# Patient Record
Sex: Male | Born: 1980
Health system: Southern US, Community
[De-identification: ages and names within clinical notes are randomized; demographics above are authoritative.]

## PROBLEM LIST (undated history)

## (undated) DIAGNOSIS — Z9989 Dependence on other enabling machines and devices: Secondary | ICD-10-CM

## (undated) DIAGNOSIS — I10 Essential (primary) hypertension: Secondary | ICD-10-CM

## (undated) DIAGNOSIS — I872 Venous insufficiency (chronic) (peripheral): Secondary | ICD-10-CM

## (undated) DIAGNOSIS — G4733 Obstructive sleep apnea (adult) (pediatric): Secondary | ICD-10-CM

## (undated) DIAGNOSIS — I890 Lymphedema, not elsewhere classified: Secondary | ICD-10-CM

## (undated) HISTORY — PX: VEIN LIGATION AND STRIPPING: SHX2653

## (undated) HISTORY — PX: OTHER SURGICAL HISTORY: SHX169

---

## 2007-01-02 ENCOUNTER — Encounter (HOSPITAL_BASED_OUTPATIENT_CLINIC_OR_DEPARTMENT_OTHER): Admission: RE | Admit: 2007-01-02 | Discharge: 2007-03-17 | Payer: Self-pay | Admitting: Surgery

## 2007-04-10 ENCOUNTER — Encounter (HOSPITAL_BASED_OUTPATIENT_CLINIC_OR_DEPARTMENT_OTHER): Admission: RE | Admit: 2007-04-10 | Discharge: 2007-05-21 | Payer: Self-pay | Admitting: Surgery

## 2007-06-23 ENCOUNTER — Encounter (HOSPITAL_BASED_OUTPATIENT_CLINIC_OR_DEPARTMENT_OTHER): Admission: RE | Admit: 2007-06-23 | Discharge: 2007-08-06 | Payer: Self-pay | Admitting: Internal Medicine

## 2007-08-25 ENCOUNTER — Encounter (HOSPITAL_BASED_OUTPATIENT_CLINIC_OR_DEPARTMENT_OTHER): Admission: RE | Admit: 2007-08-25 | Discharge: 2007-11-23 | Payer: Self-pay | Admitting: Surgery

## 2007-08-28 ENCOUNTER — Emergency Department (HOSPITAL_COMMUNITY): Admission: EM | Admit: 2007-08-28 | Discharge: 2007-08-29 | Payer: Self-pay | Admitting: Emergency Medicine

## 2007-11-20 ENCOUNTER — Encounter (HOSPITAL_BASED_OUTPATIENT_CLINIC_OR_DEPARTMENT_OTHER): Admission: RE | Admit: 2007-11-20 | Discharge: 2007-12-08 | Payer: Self-pay | Admitting: Internal Medicine

## 2007-12-15 ENCOUNTER — Encounter (HOSPITAL_BASED_OUTPATIENT_CLINIC_OR_DEPARTMENT_OTHER): Admission: RE | Admit: 2007-12-15 | Discharge: 2008-03-10 | Payer: Self-pay | Admitting: Internal Medicine

## 2007-12-27 ENCOUNTER — Inpatient Hospital Stay (HOSPITAL_COMMUNITY): Admission: EM | Admit: 2007-12-27 | Discharge: 2008-01-01 | Payer: Self-pay | Admitting: Emergency Medicine

## 2007-12-28 ENCOUNTER — Ambulatory Visit: Payer: Self-pay | Admitting: *Deleted

## 2007-12-28 ENCOUNTER — Encounter (INDEPENDENT_AMBULATORY_CARE_PROVIDER_SITE_OTHER): Payer: Self-pay | Admitting: Emergency Medicine

## 2008-03-16 ENCOUNTER — Encounter (HOSPITAL_BASED_OUTPATIENT_CLINIC_OR_DEPARTMENT_OTHER): Admission: RE | Admit: 2008-03-16 | Discharge: 2008-06-13 | Payer: Self-pay | Admitting: Internal Medicine

## 2008-06-14 ENCOUNTER — Encounter (HOSPITAL_BASED_OUTPATIENT_CLINIC_OR_DEPARTMENT_OTHER): Admission: RE | Admit: 2008-06-14 | Discharge: 2008-09-12 | Payer: Self-pay | Admitting: General Surgery

## 2008-09-14 ENCOUNTER — Encounter (HOSPITAL_BASED_OUTPATIENT_CLINIC_OR_DEPARTMENT_OTHER): Admission: RE | Admit: 2008-09-14 | Discharge: 2008-12-13 | Payer: Self-pay | Admitting: Internal Medicine

## 2008-12-15 ENCOUNTER — Encounter (HOSPITAL_BASED_OUTPATIENT_CLINIC_OR_DEPARTMENT_OTHER): Admission: RE | Admit: 2008-12-15 | Discharge: 2009-02-20 | Payer: Self-pay | Admitting: Internal Medicine

## 2009-03-28 ENCOUNTER — Encounter (HOSPITAL_BASED_OUTPATIENT_CLINIC_OR_DEPARTMENT_OTHER): Admission: RE | Admit: 2009-03-28 | Discharge: 2009-06-26 | Payer: Self-pay | Admitting: General Surgery

## 2009-05-09 ENCOUNTER — Encounter: Admission: RE | Admit: 2009-05-09 | Discharge: 2009-05-09 | Payer: Self-pay | Admitting: Family Medicine

## 2009-06-29 ENCOUNTER — Encounter (HOSPITAL_BASED_OUTPATIENT_CLINIC_OR_DEPARTMENT_OTHER): Admission: RE | Admit: 2009-06-29 | Discharge: 2009-09-27 | Payer: Self-pay | Admitting: Internal Medicine

## 2009-09-28 ENCOUNTER — Encounter (HOSPITAL_BASED_OUTPATIENT_CLINIC_OR_DEPARTMENT_OTHER): Admission: RE | Admit: 2009-09-28 | Discharge: 2009-11-21 | Payer: Self-pay | Admitting: Internal Medicine

## 2010-01-09 ENCOUNTER — Encounter (HOSPITAL_BASED_OUTPATIENT_CLINIC_OR_DEPARTMENT_OTHER)
Admission: RE | Admit: 2010-01-09 | Discharge: 2010-03-01 | Payer: Self-pay | Source: Home / Self Care | Attending: General Surgery | Admitting: General Surgery

## 2010-04-01 ENCOUNTER — Encounter: Payer: Self-pay | Admitting: Gastroenterology

## 2010-04-04 ENCOUNTER — Encounter (HOSPITAL_BASED_OUTPATIENT_CLINIC_OR_DEPARTMENT_OTHER)
Admission: RE | Admit: 2010-04-04 | Discharge: 2010-04-10 | Payer: Self-pay | Source: Home / Self Care | Attending: General Surgery | Admitting: General Surgery

## 2010-04-11 ENCOUNTER — Encounter (HOSPITAL_BASED_OUTPATIENT_CLINIC_OR_DEPARTMENT_OTHER): Payer: 59 | Attending: General Surgery

## 2010-04-11 DIAGNOSIS — I872 Venous insufficiency (chronic) (peripheral): Secondary | ICD-10-CM | POA: Insufficient documentation

## 2010-04-11 DIAGNOSIS — R609 Edema, unspecified: Secondary | ICD-10-CM | POA: Insufficient documentation

## 2010-04-11 DIAGNOSIS — L97209 Non-pressure chronic ulcer of unspecified calf with unspecified severity: Secondary | ICD-10-CM | POA: Insufficient documentation

## 2010-04-11 DIAGNOSIS — I10 Essential (primary) hypertension: Secondary | ICD-10-CM | POA: Insufficient documentation

## 2010-04-13 ENCOUNTER — Encounter (HOSPITAL_BASED_OUTPATIENT_CLINIC_OR_DEPARTMENT_OTHER): Payer: 59

## 2010-05-16 ENCOUNTER — Encounter (HOSPITAL_BASED_OUTPATIENT_CLINIC_OR_DEPARTMENT_OTHER): Payer: 59 | Attending: General Surgery

## 2010-05-16 DIAGNOSIS — I890 Lymphedema, not elsewhere classified: Secondary | ICD-10-CM | POA: Insufficient documentation

## 2010-05-16 DIAGNOSIS — I872 Venous insufficiency (chronic) (peripheral): Secondary | ICD-10-CM | POA: Insufficient documentation

## 2010-05-16 DIAGNOSIS — L97809 Non-pressure chronic ulcer of other part of unspecified lower leg with unspecified severity: Secondary | ICD-10-CM | POA: Insufficient documentation

## 2010-07-04 ENCOUNTER — Encounter (HOSPITAL_BASED_OUTPATIENT_CLINIC_OR_DEPARTMENT_OTHER): Payer: 59 | Attending: General Surgery

## 2010-07-04 DIAGNOSIS — L97209 Non-pressure chronic ulcer of unspecified calf with unspecified severity: Secondary | ICD-10-CM | POA: Insufficient documentation

## 2010-07-04 DIAGNOSIS — I872 Venous insufficiency (chronic) (peripheral): Secondary | ICD-10-CM | POA: Insufficient documentation

## 2010-07-04 DIAGNOSIS — R609 Edema, unspecified: Secondary | ICD-10-CM | POA: Insufficient documentation

## 2010-07-04 DIAGNOSIS — I10 Essential (primary) hypertension: Secondary | ICD-10-CM | POA: Insufficient documentation

## 2010-07-11 ENCOUNTER — Encounter (HOSPITAL_BASED_OUTPATIENT_CLINIC_OR_DEPARTMENT_OTHER): Payer: 59 | Attending: Plastic Surgery

## 2010-07-11 DIAGNOSIS — L97809 Non-pressure chronic ulcer of other part of unspecified lower leg with unspecified severity: Secondary | ICD-10-CM | POA: Insufficient documentation

## 2010-07-11 DIAGNOSIS — I872 Venous insufficiency (chronic) (peripheral): Secondary | ICD-10-CM | POA: Insufficient documentation

## 2010-07-12 NOTE — Assessment & Plan Note (Signed)
[facility]  NAMEGIFFORD, BALLON NO.:  [id number]  MEDICAL RECORD NO.:  [id number]      DATE OF BIRTH:  June 20, 1980  PHYSICIAN:  Wayland Denis, DO       VISIT DATE:  07/11/2010                                  OFFICE VISIT   [name] is a 30 year old white male who is here for followup on his right lower extremity edema __________ .  He had this problem on and off for number of years ever since he had vein stripping done, ACL tear, and some knee impingement that he had many years ago.  He has been using Prisma with hydrogel and an Radio broadcast assistant with some improvement over the last week.  He is not diabetic, other than venous stasis ulcers, he is healthy.  He does not smoke and no change in medications, not allergic to anything medication wise.  On exam, he is alert and oriented, cooperative, pleasant.  Pupils are equal and reactive to light and accommodation.  Extraocular muscles are intact.  Lungs are clear.  Heart is regular.  Abdomen is soft.  The wound is improving on the right lower extremity on the medial aspect. There is no sign of active infection.  He does have edema and the right leg is more than the left.  ASSESSMENT:  We will continue with Prisma, hydrogel, and Unna boot.  He does have a compression machine at home that he says, he has worn out and so at the moment it is not working.  Clydie Braun is going to call to see if we can find out some place to have it fixed or possibly get another one.  We will see him back in a week.     Wayland Denis, DO     CS/MEDQ  D:  07/11/2010  T:  07/12/2010  Job:  [id number]

## 2010-07-24 NOTE — Assessment & Plan Note (Signed)
[facility]   NAMETAYTUM, WHELLER NO.:  [id number]   MEDICAL RECORD NO.:  [id number]      DATE OF BIRTH:  June 23, 1980   PHYSICIAN:  Theresia Majors. Tanda Rockers, M.D. VISIT DATE:  04/20/2007                                   OFFICE VISIT   SUBJECTIVE:  [name] is a 30 year old man who we are following for a  stasis ulcer.  We have treated him with and Unna boot in the interim.  There has been no excessive drainage, malodor, pain or fever.   OBJECTIVE:  Blood pressure is 149/74, respirations 20, pulse of 81,  temperature 98.4.  Inspection of the right lower extremity shows that the ulcer is  contracting with advancing epithelium.  There has been good control of  edema.   ASSESSMENT:  Clinical improvement.   PLAN:  We will continue him in an Unna boot and reevaluate him in 1  week.      [name], M.D.  Electronically Signed     HAN/MEDQ  D:  04/20/2007  T:  04/21/2007  Job:  [id number]

## 2010-07-24 NOTE — Assessment & Plan Note (Signed)
[facility]   NAMEWAI, [name] NO.:  [id number]   MEDICAL RECORD NO.:  [id number]      DATE OF BIRTH:  June 22, 1980   PHYSICIAN:  Theresia Majors. Tanda Rockers, M.D. VISIT DATE:  07/20/2007                                   OFFICE VISIT   SUBJECTIVE:  [name] is a 30 year old man who we followed for recurrent  statis ulcer involving his right lower extremity.  In the interim, we  treated him with an Radio broadcast assistant.  He continues to be ambulatory.  There  has been no excessive drainage.  His edema has been reasonably well  controlled.   OBJECTIVE:  VITAL SIGNS:  Blood pressure is 158/90, respirations 18,  pulse rate 84, and temperature 98.3.  LOWER EXTREMITY:  Inspection of the right medial lower extremity shows  that the ulcer has indeed contracted.  There is no evidence of active  infection, cellulitis, abscess, or lymphangitis.  The pedal pulse  remains palpable.  The edema is 1 to 2+ with adequate compression, is  manifested by linear wrinkles of the leg.   PLAN:  We have given the patient a prescription for bilateral 30 to 40-  mm below-the-knee compression hose.  We have also reapplied his Unna  wrap.  We will re-evaluate him in 1 week with anticipation that the  ulcer should be reepithelialized.      [name], M.D.  Electronically Signed     HAN/MEDQ  D:  07/20/2007  T:  07/21/2007  Job:  [id number]

## 2010-07-24 NOTE — Assessment & Plan Note (Signed)
[facility]   NAMEREMUS, HAGEDORN NO.:  [id number]   MEDICAL RECORD NO.:  [id number]      DATE OF BIRTH:  Mar 12, 1980   PHYSICIAN:  Lenon Curt. Chilton Si, M.D.   VISIT DATE:  09/30/2008                                   OFFICE VISIT   HISTORY:  A 30 year old male with chronic venous insufficiency and  ulcers of the right lower leg returns for recheck today.  He says since  last seen he has had an increase in drainage.  There has been no odor or  increase in pain.  He has not had fever or chills.  Drainage has been  enough to saturate bandages and gets its way through the Unna boot that  previously had been applied.  He has removed this before coming to  clinic today.   PHYSICAL EXAMINATION:  Right leg appears to have the larger wounds than  the last time I saw him.  There are sharply marginated edges.  There is  clear fluid draining copiously from this area.  There is not any  particular tenderness.   TREATMENT:  Right leg had gentamicin ointment applied to open wounds,  Adaptic over that, ABD pad to absorb drainage, and an Radio broadcast assistant.  We  cultured his right leg wounds.  He was started on Septra DS 20 tablets 1  twice daily.  He is to return in 1 week for reevaluation and bandage  change.   ICD-9 454.2.  CPT [id number].      Lenon Curt Chilton Si, M.D.  Electronically Signed     AGG/MEDQ  D:  09/30/2008  T:  10/01/2008  Job:  [id number]

## 2010-07-24 NOTE — Assessment & Plan Note (Signed)
[facility]   [name], [name]:  [id number]   MEDICAL RECORD NO.:  [id number]      DATE OF BIRTH:  25-Nov-1980   PHYSICIAN:  Lenon Curt. Chilton Si, M.D.   VISIT DATE:  10/21/2008                                   OFFICE VISIT   A 30 year old male returns for recheck of chronic varicose vein  ulcerations of his right leg.  There has been no odor, fever, or chills.  Wound drainage has been minimal.  The patient is satisfied with progress  of his wound.   PHYSICAL EXAMINATION:  Temperature 98.6, pulse 74, respirations 18,  blood pressure 168/92.  Right inner calf now measures 3.0 x 0.6 x 0.1.  There was a small wound of the right outer calf just below the knee,  which I think is trivial scratch.   TREATMENT:  Gentamicin ointment 0.1% applied to wound, then Adaptic ABD  and an Radio broadcast assistant.  The patient is to return for nurse check in 1 week,  physician check in 2 weeks.   ICD-9 454.2.   CPT [id number].      Lenon Curt Chilton Si, M.D.  Electronically Signed     AGG/MEDQ  D:  10/21/2008  T:  10/22/2008  Job:  [id number]

## 2010-07-24 NOTE — Assessment & Plan Note (Signed)
[facility]   NAMERIHAAN, BARRACK NO.:  [id number]   MEDICAL RECORD NO.:  [id number]      DATE OF BIRTH:  1981/02/06   PHYSICIAN:  Lenon Curt. Chilton Si, M.D.   VISIT DATE:  06/17/2008                                   OFFICE VISIT   HISTORY:  A 30 year old male returns to clinic for re-inspection of  chronic varicose vein ulceration.  The patient is feeling well.  Drainage has been very modest.  There has been no fever, pain, chills,  or foul odor.   PHYSICAL EXAMINATION:  Temperature is 97.6, pulse 76, respirations 20,  and blood pressure 149/103.  Wound measurements of the right medial  lower extremity are now 2.9 x 2.6 x 0.2.  There is no open area.  It is  showing good granulation tissue at the base.  There is no significant  drainage and no evidence of purulence.  Previously, open areas of the  wound are remaining closed and the wound is progressing towards healing.   TREATMENT:  The Silvercel, hydrogel, and Unna boot were reapplied to the  right leg today.  The patient will return in 2-3 weeks.  He is to have  weekly changes of his dressing and Unna boot by the nurse in between  visits with the physician.   CPT code 454.0 varicose vein with ulcer.  CPT code 16109.      Lenon Curt Chilton Si, M.D.  Electronically Signed     AGG/MEDQ  D:  06/17/2008  T:  06/18/2008  Job:  [id number]

## 2010-07-24 NOTE — Assessment & Plan Note (Signed)
[facility]   [name], [name] NO.:  [id number]   MEDICAL RECORD NO.:  [id number]      DATE OF BIRTH:  November 20, 1980   PHYSICIAN:  Barry Dienes. Eloise Harman, M.D.     VISIT DATE:                                   OFFICE VISIT   SUBJECTIVE:  The patient is a 30 year old man of African American  descent who is being treated for a chronic right leg venous stasis  ulcer.  He is status post Apligraf treatment on December 22, 2007, and  has most recently had treatment with silver alginate, Anola Gurney, and an  Radio broadcast assistant.  In addition, he has been on Zestoretic 10/12.5 every a.m.  since January 12, 2008.  He has had decreased drainage on the wound and  has not had significant pain or fever or chills.   OBJECTIVE:  VITAL SIGNS:  Blood pressure 129/83, pulse 74, respirations  20, and temperature 98.9.   The condition of the wound is as follows:  Wound #3:  It is located on the medial aspect of the right leg and  measures 2 cm x 1.6 cm x 0.2 cm.  There is no undermining of the wound.  There is a minimal amount of eschar on the wound and a minimal amount of  necrotic tissue on the ulcer.  The wound base is red in color and there  is some pink granulation tissue.  There is no exposed bone, tendon, or  muscle and no foul odor.  There is no significant discharge and the  periwound integrity is intact.   ASSESSMENT:  Slowly improving right leg venous stasis ulcer.   PLAN:  He does not have intact sensation in the area around this ulcer,  so there was no need for topical anesthesia.  A #15 scalpel was used to  debride the necrotic material within the ulcer and the surrounding  tissue.  This was done without significant discomfort, in fact he slept  through the procedure.  There was no significant bleeding.  Following  this, the wound was covered with silver alginate, covered by an Anola Gurney  pad, and an Radio broadcast assistant wrap.  He will also have a basic metabolic panel  drawn today to reevaluate his serum potassium level given that the  Zestoretic was recently started.  He should have a physician  reevaluation in approximately 1 week.           ______________________________  Barry Dienes. Eloise Harman, M.D.     DGP/MEDQ  D:  01/26/2008  T:  01/27/2008  Job:  [id number]

## 2010-07-24 NOTE — Assessment & Plan Note (Signed)
[facility]   [name], [name] NO.:  [id number]   MEDICAL RECORD NO.:  [id number]      DATE OF BIRTH:  Nov 06, 1980   PHYSICIAN:  Lenon Curt. Chilton Si, M.D.   VISIT DATE:  07/15/2008                                   OFFICE VISIT   HISTORY:  A 30 year old male with longstanding history of venous stasis  ulcerations to the right lower leg returns today.  He has had  significant progress and wound healing over the last few weeks.  There  is some minimal drainage now and coverage of the previously ulcerated  area has substantially improved.  There are still ulcerations at the  anterior edge of the wound on the superior edge of the wound, which  appeared to be new, and one had a small amount of purulent material  expressed from it.   PHYSICAL EXAMINATION:  Temperature 98.5, pulse 71, respirations 18,  blood pressure 128/77.  Wound measurement of right medial leg was not  this visitation.  There were small ulcerations on the anterior edge of  the wound and on the superior edge.  There is a lot of scaling of the  skin.  There continues to be no severe edema of his legs.   TREATMENT:  Right leg was sharply debrided of callus and slough under no  anesthesia.  Fibrous tissue and necrotic material were also removed.  This was done with scalpel and forceps.  No bleeding was encountered.  It was a selective debridement of 20 sq cm or less.   Following this, he had reapplication a silvercell, hydrogel, and an Conservator, museum/gallery.  He is asked to come back next week.  The tissue culture was  obtained today.  He received a prescription for Septra DS one twice  daily for 10 days,  Let us hope that wound healing will progress to the  point that he can be discharged from clinic soon.  I have some concerns  over whether there was infection present on the edges of this wound  today.   ICD-9 CODE:  454.0, varicose vein with ulcer.   CPT CODE:  16109 and 97597  selective debridement less than 20 sq cm.      Lenon Curt Chilton Si, M.D.  Electronically Signed     AGG/MEDQ  D:  07/15/2008  T:  07/16/2008  Job:  [id number]

## 2010-07-24 NOTE — Assessment & Plan Note (Signed)
[facility]   [name], [name] NO.:  [id number]   MEDICAL RECORD NO.:  [id number]      DATE OF BIRTH:  [birth year]   PHYSICIAN:  Jonelle Sports. Sevier, M.D.  VISIT DATE:  02/25/2007                                   OFFICE VISIT   HISTORY:  This 30 year old black male with obesity and severe venous  insufficiency of the lower extremities is being followed for chronic  stasis ulcers of the right lower extremity.   He has a prior history of a saphenous vein implant into the iliac venous  system on the contralateral side to this right lower extremity lesion,  but it appears now not clinically at least to be functioning.  Accordingly, Dr. Tanda Rockers after a [place] conversation with Dr. Jacolyn Reedy in  [place] has referred the patient there.  They attempted venous  studies and had limited success because of his morphology.  They do feel  that there is evidence for considerable obstruction if not complete  obstruction in that common femoral vein again.  Accordingly, they had  planned to do a CT scan.  According to the patient, this has been done  but they have as yet sent [place] no report from that.  We will make an  effort to get it.   Meanwhile, the patient feels that his progress of the wounds continues  satisfactorily.  There has been some odor, but no significant drainage,  no pain, no fever or systemic symptoms.   PHYSICAL EXAMINATION:  Blood pressure 156/99, pulse 94, respirations 20,  temperature 98.2.   On the posterior medial aspect of the distal right calf, there is  persistent open ulceration 0.7 x 0.4 x 0.1 cm in an area of chronic  scarring.  There does not appear to be any superficial or deep  infection.   Laterally on that same leg about 2/3 of the way down the calf is a tiny  lesion, almost immeasurable, representing healing from a larger ulcer  there previously.  Again no evidence of infection.   IMPRESSION:  Continued progress  with healing ulcers of the right lower  extremity.   DISPOSITION:  Request will be made from [facility] to try to obtain the  results of the CT scan, after which Dr. Tanda Rockers may wish to discuss the  issue again with Dr. Jacolyn Reedy.   The entire right calf and foot are cleansed with hypochlorite solution  because of the increased odor suggesting perhaps there is a significant  bacterial burden there.  Following this, the extremity was returned to  the Unna wrap.   The patient's return visit here will be in 7 to 10 days.           ______________________________  Jonelle Sports Cheryll Cockayne, M.D.     RES/MEDQ  D:  02/25/2007  T:  02/25/2007  Job:  [id number]

## 2010-07-24 NOTE — Assessment & Plan Note (Signed)
[facility]   [name], [name] NO.:  [id number]   MEDICAL RECORD NO.:  [id number]      DATE OF BIRTH:  [birth year]   PHYSICIAN:  Maxwell Caul, M.D.      VISIT DATE:                                   OFFICE VISIT   LOCATION:  [facility].   This is a 30 year old man who is seen for evaluation of a chronic right  venous stasis ulcer and he has been followed for this.  He had an  Apligraf application on October 30, 2007, and has been treated with  silver alginate dressings covered by [name] and an Radio broadcast assistant.  He  has not had any significant pain.  However, he does have a moderate  amount of drainage.  He had actually removed the Unna boot before he  came in today, so we are not exactly certain about this, although there  was some drainage present even while we were cleaning up the wound.   On examination, he is afebrile.  The wound on the right leg is actually  quite small.  It was manually debrided of some easily removable eschar.  However, in doing this, I noted that there is a considerable amount of  tissue around this that seems boggy, and when compress, there was some  drainage coming out of the wound.  This did not appear to be infected.  There was no pain or tenderness.  However, I think there may be more  tissue at risk around this tiny wound.   IMPRESSION:  Venous stasis ulceration.  The wound was debrided as  described above.  We will continue with the silver alginate, Extra-Sorb,  and thick ABD pad over the tissue surrounding the lower and lateral  aspects of the wound.  All of this will be under an Unna wrap.  We will  see this again next week.           ______________________________  Maxwell Caul, M.D.     MGR/MEDQ  D:  02/02/2008  T:  02/02/2008  Job:  [id number]

## 2010-07-24 NOTE — Assessment & Plan Note (Signed)
[facility]   NAMEAMMAN, BARTEL NO.:  [id number]   MEDICAL RECORD NO.:  [id number]      DATE OF BIRTH:  [birth year]   PHYSICIAN:  Theresia Majors. Tanda Rockers, M.D. VISIT DATE:  01/06/2007                                   OFFICE VISIT   SUBJECTIVE:  Mr. Dettore is a 30 year old man referred by Dr. Louanna Raw  for evaluation of a large ulceration on the right lower extremity.   IMPRESSION:  Postphlebitic state with recurrent stasis ulceration.   RECOMMENDATION:  Proceed with an Unna boot protocol followed by  compression garments.   SUBJECTIVE:  Mr. Jeansonne is a 30 year old man who has suffered with  recurrent stasis ulceration for several years.  The current ulcer has  been present for approximately 1 month.  It has been associated with  itching, malodorous drainage but no fever.  The patient does not recall  any specific trauma predisposing to the ulceration.   Over the past 8 years the patient has had recurrent stasis ulcerations  related to swelling and his obesity.  He has been treated in a wound  center in [place], [place] on a palliative status.  He has had previous  lower extremity vein transposition surgery in an attempt to correct  venous insufficiency.  He has had multiple venograms and duplex scans  which confirm the postphlebitic state.  He has never had deep venous  thrombosis and has never been treated with anticoagulation.   CURRENT MEDICAL CONDITIONS:  Include only his exogenous obesity.  HE  DENIES ALLERGIES.   CURRENT MEDICATION LIST:  Includes only over-the-counter pain  medication.   PREVIOUS SURGERY AND PROCEDURES:  Have included the multiple venograms,  vein strippings from the right leg and a tonsillectomy.   FAMILY HISTORY:  Positive for lung cancer, prostate cancer and diabetes.   Socially he is single.  He lives in [place].  He is employed as a  Surveyor, minerals.   REVIEW OF SYSTEMS:  The patient has  difficulty with his exercise  tolerance related to his exogenous obesity.  He specifically denies  angina pectoris, syncope.  His appetite is good and his weight has been  stable at 330 pounds over the past year.  There are no bowel or bladder  complaints. He denies polydipsia, polyphagia, or polyuria.  The  remainder of the review of systems is negative.   PHYSICAL EXAM:  He is a robust young man in no acute distress.  He is  oriented, in good contact with reality and able to give the history.  The blood pressure is 143/89, respirations are 20, pulse rate is 90,  temperature is 98.5.  HEENT:  Exam is clear.  NECK:  Supple, trachea is midline, thyroid is nonpalpable.  LUNGS:  Clear.  HEART:  Sounds are distant.  ABDOMEN:  Robust with no tenderness.  EXTREMITY:  Abnormal with chronic changes of stasis, more severe on the  right lower extremity.  There is an ulceration at the soleus level  extending half the circumference of the leg.  There is  hyperpigmentation.  The dorsalis pedis pulse is +3 bilaterally.  There  is +2 edema on the right lower extremity, +1 edema on the left.  Sensation is  preserved as tested by the signs of Weinstein filament.   DISCUSSION:  This 30 year old man has a longstanding history of  recurrent stasis ulceration related to a postphlebitic state and gives a  history of having undergone surgical attempts at restoration of venous  competency.  On today's exam he has a shaggy necrotic stasis ulcer.  We  have deferred debridement at this time due to the degree of  desquamation, rather we will start the patient on Dome's paste Unna boot  and reevaluate him in 1 week.  At that time he will likely require  debridement of liquefied and shedding desquamated skin.  We will prepare  him to reinitiate the use of external compression hose.   With regard to his obesity the patient informs me that he has previously  been given an exercise program which he intends to start  under the  direction of a trainer.  He has delayed because of his move to this area  from [place].  We have given the patient an opportunity to ask questions, he seems to  understand.  He expresses gratitude for having been seen in the clinic  and indicates that he will be compliant.      [name], M.D.  Electronically Signed     HAN/MEDQ  D:  01/06/2007  T:  01/06/2007  Job:  [id number]   cc:   Louanna Raw

## 2010-07-24 NOTE — Assessment & Plan Note (Signed)
[facility]   NAMESOLMON, BOHR NO.:  [id number]   MEDICAL RECORD NO.:  [id number]      DATE OF BIRTH:  [birth year]   PHYSICIAN:  Lenon Curt. Chilton Si, M.D.   VISIT DATE:  04/29/2008                                   OFFICE VISIT   HISTORY:  A 30 year old male returns again for re-inspection of wounds  of his right leg due to varicose veins, stasis dermatitis, and  ulceration.  Since his last visit here, the wounds have continued to  heal slowly.  There no longer is any odor to the wounds according to the  patient, and there is very little discomfort.  He has tolerated the  application of medications and Unna boot well.   PHYSICAL EXAMINATION:  VITAL SIGNS:  Temp 99.1, pulse 98, respirations  18, blood pressure 137/97 (improved since last visit).  Wound  measurement of the right medial lower extremity is now 2.7 x 3.5 x 0.2  cm.  There is no significant erythema.  There is only slight drainage.  There is a punched out character to the wound overall.  There is no  significant callus or other adherent material.   TREATMENT:  Silvercel, hydrogel, reapplication of Unna boot.   ICD-9 code 454.0 varicose veins with stasis ulcer.  401.9, hypertension.   CPT [id number].   The patient will return in 1 week.      Lenon Curt Chilton Si, M.D.  Electronically Signed     AGG/MEDQ  D:  04/29/2008  T:  04/30/2008  Job:  [id number]

## 2010-07-24 NOTE — Assessment & Plan Note (Signed)
[facility]   NAMEKOLLYN, LINGAFELTER NO.:  [id number]   MEDICAL RECORD NO.:  [id number]      DATE OF BIRTH:  1980/08/08   PHYSICIAN:  Lenon Curt. Chilton Si, M.D.   VISIT DATE:  02/19/2008                                   OFFICE VISIT   HISTORY:  This 30 year old male with large venous stasis ulcer of the  right leg that is showing some signs of resolution, now returns for  recheck of his wound.  Judging from pictures in the chart and  descriptions, it appears at this wound was nearly healed in June.  The  patient has actually been followed in this clinic since October 2008.  By mid August, it was deteriorating and by October, showed significant  deterioration of the wound, it now has bridged and shows some signs of  healing; however, the areas that are worrisome as will be detailed  below.  The patient denies any significant pain.  Because of the chronic  venous insufficiency and swelling of legs, this wound weeps a copious  amount of serous fluid.  There is no bad odor to the wound.   PHYSICAL EXAMINATION:  Temperature 98.0, pulse 82, respirations 16, and  blood pressure 155/97.  Wound measurements of the right medial lower  extremity are now 1.9 x 1.9 x 0.2 and actually 2 separate open areas  with bridging of tissue between them.  On the proximal end of the lower  portion of the open wound, there was a deep scooped out area and a shelf-  like characteristic of healthy skin around it and to the wound, there  was slough and goo in this area.  In the upper wound similarly there was  a very small area that was covered with slough and goo that also is  showing signs of darkening of tissue and is worrisome for either  infection or wound deterioration.  The overall wound appearance actually  is much better as compared to previous pictures, but again we have these  2 worrisome areas.   It should be noted that the patient had a Profore dressing placed  his  last visit about a week ago and this did not stay in place more than a  half a day.  The patient therefore resumed wearing his stockings and has  not been wearing any other sort of material on top of his legs for  compression.  He does have a venous pump at home that he uses 30 minutes  twice daily.   TREATMENT:  There was a sharp debridement using scalpel and forceps to  remove callus and slough that surrounded the wounds and were anterior to  the wounds.  The wounds again showed an overall improvement, but there  were some deteriorated areas within the wound as described above.  There  was no bleeding.  No anesthesia was used.  The procedure was tolerated  well.   Following debridement, Prisma and an Unna boot were reapplied to the  right leg.  He was instructed to use a venous pump 30 minutes twice  daily.  The patient had tissue cultures done of the wound today in case  there was some infection that we could treat.  ICD-9, 454.0 varicose vein with stasis ulcer.  CPT codes, 16109 and [phone] (selective debridement less than 20 cm2).      Lenon Curt Chilton Si, M.D.  Electronically Signed     AGG/MEDQ  D:  02/19/2008  T:  02/20/2008  Job:  [id number]

## 2010-07-24 NOTE — Assessment & Plan Note (Signed)
[facility]   NAMEJVEON, POUND NO.:  [id number]   MEDICAL RECORD NO.:  [id number]      DATE OF BIRTH:  17-Sep-1980   PHYSICIAN:  Theresia Majors. Tanda Rockers, M.D. VISIT DATE:  08/26/2007                                   OFFICE VISIT   0   SUBJECTIVE:  Mr. Espin is a 30 year old man with a postphlebitic right  lower extremity associated with recurrent stasis ulcerations.  He  presents after noting moist drainage on the medial aspect of the right  lower leg in an area of previous ulceration.  There has been no interim  fever.  There has been no chest pain or palpitations.   OBJECTIVE:  VITAL SIGNS:  His blood pressure is 160/87, respirations 20,  pulse rate 83, temperature is 98.6.  Inspection of the right medial  lower extremity shows a recurrent stasis ulcer with chronic changes of  stasis including hyperpigmentation and woody, brawny induration.  There  is no particular tenderness.  The pedal pulse remains palpable.  There  is associated 2-3 plus edema.  The left lower extremity is normal.  There is an area of thrombosed varix on the medial posterior buttock  area.  There is no ulceration at this level, however.   ASSESSMENT:  Recurrent stasis ulcer associated with a postphlebitic  state.   PLAN:  We are returning the patient to triamcinolone ointment plus an  Unna wrap.  The nurse will reevaluate him weekly.  The physician will  reevaluate him in 2 weeks.   We have counseled the patient regarding the necessity for a  comprehensive weight loss program.  We have emphasized a relationship  between his stasis, the underlying postphlebitic state including the  iliac vein stenosis/occlusion.  We have defined his current condition as  one that is permanent and not likely to spontaneously resolve.  Comprehensive weight reduction of 100 pounds would greatly leave his  current symptoms of cycle of recurrences.  We have given the patient  opportunity to ask questions.  He seems to understand and indicates that  he would like to pursue the suggestion.  We have encouraged him to  discuss a bariatric consultation with his primary care physician.      [name], M.D.  Electronically Signed     HAN/MEDQ  D:  08/26/2007  T:  08/27/2007  Job:  [id number]

## 2010-07-24 NOTE — Assessment & Plan Note (Signed)
[facility]   [name], MARSALIS NO.:  [id number]   MEDICAL RECORD NO.:  [id number]      DATE OF BIRTH:  Jun 23, 1980   PHYSICIAN:  Lenon Curt. Chilton Si, M.D.   VISIT DATE:  05/19/2008                                   OFFICE VISIT   HISTORY:  A 30 year old male returns for reinspection of persistent  varicose vein with ulceration of the right leg.  He has chronic venous  insufficiency.  He denies any significant increase in pain or  discomfort.  Wound odor has not changed to him.  The patient failed to  get prescription for Septra DS that was ordered on May 13, 2008, filled  until just this morning.  He did have a positive culture for MRSA on  May 06, 2008, which was finally reported out on May 13, 2008.  It  showed only rare MRSA, however, with the persistence of this wound, it  was felt important to go ahead and treat this.  The organism is  resistant to doxycycline, so Septra was ordered per the sensitivity  reports.   PHYSICAL EXAMINATION:  Temperature 98.0, pulse 80, respirations 20,  blood pressure 145/105.  Wound measurements of right medial lower  extremity #3 were 3.7 x 13.2 x 0.2 cm.  These wound measurements are  significantly larger than previously noted.  There are areas of healthy  skin in between with some bridging across the ulcerations.  I think  there may be a discrepancy in how the wound was measured the last time  versus this time.  Overall appearance of the wound does not appear that  different to me than on my examination on May 06, 2008.   TREATMENT:  No further debridement was done today.  We reapplied  SilverCel, hydrogel, and an Radio broadcast assistant.  The patient was strongly  encouraged to take the Septra that had been ordered on May 13, 2008 and  was told that it was unfortunately that he had delayed getting this  prescription filled because sometimes the organism present in his wound  could advance rapidly and  disrupt healing.  The patient stated that he  would begin taking the medication today.   Return in 1 week.   ICD-9 code 454.0 varicose veins with ulcer.  CPT X5182658.      Lenon Curt Chilton Si, M.D.  Electronically Signed     AGG/MEDQ  D:  05/19/2008  T:  05/19/2008  Job:  [id number]

## 2010-07-24 NOTE — Assessment & Plan Note (Signed)
[facility]   NAMECASHTYN, [name] NO.:  [id number]   MEDICAL RECORD NO.:  [id number]      DATE OF BIRTH:  1981-02-14   PHYSICIAN:  Maxwell Caul, M.D.      VISIT DATE:                                   OFFICE VISIT   [name] is a gentleman we have been following for a large venous stasis  ulceration on the right leg.  This is a gentleman who had previous stent  placement in his groin and he says a venous damage proximally at the  same time.  In any case, he has been left with severe venous stasis on  the right leg much worse than the left.  The wound we have been dealing  with is on the right leg.  This continues to improve using a collagen-  based dressing and an Unna boot wrap.   On examination of right leg, the areas in question are continuing to  epithelialize.  They look very healthy.  We re-applied Prisma and  hydrogel to this.   IMPRESSION:  Venous stasis ulcerations.  We have continued a Prisma and  hydrogel under an Unna wrap.  I have given him a prescription for a 30-  to 40-mm below-knee graded pressure stockings.  He can be measured for  this before he comes in next time so that we can get this under way.  Other than that, I suspect this will resolve in the next 1-2 visits.           ______________________________  Maxwell Caul, M.D.     MGR/MEDQ  D:  03/10/2008  T:  03/11/2008  Job:  [id number]

## 2010-07-24 NOTE — Assessment & Plan Note (Signed)
[facility]   NAMEZAYN, SELLEY NO.:  [id number]   MEDICAL RECORD NO.:  [id number]      DATE OF BIRTH:  [birth year]   PHYSICIAN:  Theresia Majors. Tanda Rockers, M.D. VISIT DATE:  07/27/2007                                   OFFICE VISIT   SUBJECTIVE:  Mr. Otting is a 30 year old man who we have followed for a  recurrent stasis ulcer involving the right medial lower extremity.  He  returns for followup.  In the interim, we have treated him with external  compression.  There has been no interim fever.  He continues to be  ambulatory.  The edema has been reasonably well controlled.  He has  procured a custom hose, but he did not bring it with him.   OBJECTIVE:  Blood pressure is 157/93, respirations 16, pulse rate 80,  temperature 98.  Inspection of the right lower extremity shows that the  edema is reasonably well controlled.  It is trace to 1+.  Pedal pulse  remains palpable.  Capillary refill is brisk.  The ulcer is completely  100% epithelialized.  There is no evidence of ascending infection.   ASSESSMENT:  Resolved stasis ulcer.   PLAN:  We have instructed the patient in the wear and the use of his  external compression hose.  We have given him an opportunity to ask  questions.  He seems to understand these instructions and indicates that  he will be compliant.  We are discharging him to return to the care of  his primary care physician, Dr. Louanna Raw.  We will re-evaluate him  on a p.r.n. basis.      [name], M.D.  Electronically Signed     HAN/MEDQ  D:  07/27/2007  T:  07/28/2007  Job:  [id number]

## 2010-07-24 NOTE — Assessment & Plan Note (Signed)
[facility]   [name], MCCANNON NO.:  [id number]   MEDICAL RECORD NO.:  [id number]      DATE OF BIRTH:  [birth year]   PHYSICIAN:  Lenon Curt. Chilton Si, M.D.        VISIT DATE:                                   OFFICE VISIT   HISTORY:  A 30 year old male returns for reevaluation of venous stasis  ulcer of the right lower leg.  Wound has done fairly well.  Odor that  previously noted by the patient has disappeared over this last week.  The wound itself seems smaller to him and to me.   PHYSICAL EXAMINATION:  Temperature 98.2, pulse 81, respirations 18, and  blood pressure 159/101.  Wound measurement of #3, lower extremity of the  right leg is now 2.4 x 5 x 0.2.  These wound measurements were larger  than those we recorded last week.  However, the wound itself is drier  and looks smaller to my eye and the patient's will simply be measured  next week.   TREATMENT:  We applied Silvercel, hydrogel, and an Unna boot to the  wound.  The patient to return in 1 week.   ICD-9 454.0 varicose veins with stasis ulcer and 401.9 hypertension.   CPT [id number].      Lenon Curt Chilton Si, M.D.  Electronically Signed     AGG/MEDQ  D:  04/22/2008  T:  04/23/2008  Job:  [id number]

## 2010-07-24 NOTE — Group Therapy Note (Signed)
NAMEKHALIFA, KNECHT NO.:  [id number]   MEDICAL RECORD NO.:  [id number]           PATIENT TYPE:   LOCATION:                                 FACILITY:   PHYSICIAN:  Barry Dienes. Eloise Harman, M.D.DATE OF BIRTH:  November 26, 1980                                 PROGRESS NOTE   SUBJECTIVE:  The patient is a 30 year old African American man with a history of  postphlebitic syndrome and right leg stasis ulcer.  He is status post  application of Apligraf on October 30, 2007, followed by treatment with  Kandis Mannan, ABD pad, and Unna boot; change once weekly.  He has not  had significant pain in the right leg, has not had any recent fever or  chills.  He also has not had significant drainage from the ulcer.   OBJECTIVE:  On the right medial leg there is a shallow ulcer that measures 1.3 cm x  4.5 cm x 0.2 cm.  The ulcer does not have covering eschar or necrotic  tissue.  The wound base is red and there is some red granulation tissue.  There is no exposed bone, tendon, or muscle.  There was a moderate  amount of serosanguineous discharge and the periwound integrity was  intact.   ASSESSMENT:  Healing right leg venous stasis ulcer following application of Apligraf.   PLAN:  To the right leg ulcer we will apply Hydrofera Blue followed by an ABD  pad and with an Radio broadcast assistant wrap.  The dressings will be change once  weekly by nursing staff.  He should have a physician re-evaluation in  approximately 3 weeks.           ______________________________  Barry Dienes Eloise Harman, M.D.     DGP/MEDQ  D:  11/17/2007  T:  11/18/2007  Job:  [id number]

## 2010-07-24 NOTE — Assessment & Plan Note (Signed)
[facility]   NAMETAFT, WORTHING NO.:  [id number]   MEDICAL RECORD NO.:  [id number]      DATE OF BIRTH:  Feb 03, 1981   PHYSICIAN:  Lenon Curt. Chilton Si, M.D.   VISIT DATE:  10/14/2008                                   OFFICE VISIT   HISTORY:  A 30 year old male with chronic venous stasis disease,  particularly in the right leg and ulcerations, returns for recheck of  his wounds today.  He has done fine since he was last seen.  There has  been no odor to the wound, drainage is subsiding.  He is tolerating  current therapy well.   PHYSICAL EXAMINATION:  VITAL SIGNS:  Temperature 98.6, pulse 96,  respirations 20, and blood pressure 160/100.   Wound to the right inner calf area, which is the one that is most  chronic, now measuring 4.0 x 1.3 x less than 0.1 cm in diameter.  There  is a area of the right outer calf that he says has been there for quite  sometime, but will be categorized as new wound today because we have  been measuring it, that is 1.0 x 0.2 x less than 0.1 cm.  There is no  odor to the wounds.  There is no erythema.  There is some drainage to  the chronic edema of his legs.  Overall, progression of the wound seems  to be positive at the present time.  We will simply continue current  treatments.   TREATMENT:  Gentamicin ointment applied to raw areas of the skin,  covered by Adaptic and ABD pad, and then Unna boot was applied to the  right leg.   The patient will return in 1 week.   ICD-9 code:  454.2, varicose veins with ulcer and inflammation.  CPT:  [id number].      Lenon Curt Chilton Si, M.D.  Electronically Signed     AGG/MEDQ  D:  10/14/2008  T:  10/15/2008  Job:  [id number]

## 2010-07-24 NOTE — Assessment & Plan Note (Signed)
[facility]   NAMERAIFORD, FETTERMAN NO.:  [id number]   MEDICAL RECORD NO.:  [id number]      DATE OF BIRTH:  [birth year]   PHYSICIAN:  Maxwell Caul, M.D.      VISIT DATE:                                   OFFICE VISIT   [name] is a gentleman who has a refractory venous stasis wound on the  right lateral leg.  We had this healed earlier this year however in June  it reopened.  He has been using Unna and Hydrofera Blue but making very  poor progress.   On examination, temperature was 98.4.  The wound was dry and had some  adherent eschar.  We did a nonselective debridement of this area to  remove the eschar and to prep the wound bed for an Apligraf which was  then successfully placed.   IMPRESSION:  Refractory venous stasis ulcer.  The Apligraf was fixed  with Steri-Strips and had Mepitel applied and then he was rewrapped with  an Solicitor.  We will see him again in the usual fashion next week.           ______________________________  Maxwell Caul, M.D.     MGR/MEDQ  D:  10/30/2007  T:  10/31/2007  Job:  [id number]

## 2010-07-24 NOTE — Assessment & Plan Note (Signed)
[facility]   NAMETRYGVE, THAL NO.:  [id number]   MEDICAL RECORD NO.:  [id number]      DATE OF BIRTH:  [birth year]   PHYSICIAN:  Lenon Curt. Chilton Si, M.D.   VISIT DATE:  08/12/2008                                   OFFICE VISIT   HISTORY:  A 30 year old male with chronic venous stasis ulcers of the  right lower leg.  He was seen in recheck today.  He has done well, has  no drainage.  No odor and feels that he is comfortable with his leg at  the present time.   PHYSICAL EXAMINATION:  VITAL SIGNS:  Temperature 99, pulse 87, blood  pressure 130/81.  EXTREMITIES:  There was really no open wounds on the chronically scarred  area where he had his prior venous ulceration.  Wound is deemed healed.  At this time, tissues are fragile and there is a brawny edema.  Further  trauma or poor care of this area will make it extremely high risk for  breakdown of these tissues.   TREATMENT:  The patient is discharged from clinic today.  He was advised  to wear a compression hose daily.   ICD-9 code 454.0, varicose veins with ulceration.   CPT code 70623.      Lenon Curt Chilton Si, M.D.  Electronically Signed     AGG/MEDQ  D:  08/12/2008  T:  08/13/2008  Job:  [id number]

## 2010-07-24 NOTE — Assessment & Plan Note (Signed)
[facility]   NAMECLEDIS, SOHN NO.:  [id number]   MEDICAL RECORD NO.:  [id number]      DATE OF BIRTH:  02/10/1981   PHYSICIAN:  Maxwell Caul, M.D. VISIT DATE:  10/02/2007                                   OFFICE VISIT   [name] is a gentleman with very significant venous stasis and a  refractory venous stasis ulceration on his right leg.  We had had this  healed; however, he was seen in June with reopening of the wound on the  right leg.  He has been wearing an Solicitor with Hydrofera Blue.   On examination, his temperature is 98.6, pulse 86, respirations 16, and  blood pressure 135/79.  Wound measures 3 x 4.8 x 0.25; this is an  improvement.  The wound has healthy granulation and some  epithelialization.  There is some edema superiorly that we will need to  take into account when doing the wrap.  He tells me he did have an  Apligraf either late 2008 or early 2009, which helped in healing;  however, we ultimately ended up with reopening.   IMPRESSION:  Refractory venous stasis ulceration.  I have reordered an  Apligraf.  In the meantime, we have continued with Hydrofera Blue and  Unna with TCA ointment.  We will see him again in [facility] next  weekend and first physician visit after that.           ______________________________  Maxwell Caul, M.D.     MGR/MEDQ  D:  10/02/2007  T:  10/03/2007  Job:  [id number]

## 2010-07-24 NOTE — Assessment & Plan Note (Signed)
[facility]   NAMERETT, STEHLIK NO.:  [id number]   MEDICAL RECORD NO.:  [id number]      DATE OF BIRTH:  [birth year]   PHYSICIAN:  Theresia Majors. Tanda Rockers, M.D. VISIT DATE:  04/27/2007                                   OFFICE VISIT   SUBJECTIVE:  [name] is a 30 year old man with a stasis ulcer involving  the right medial lower extremity.  He has responded to external  compression.  He returns for followup.  In the interim, there has been  scant drainage and no fever.  He continues to be ambulatory.   OBJECTIVE:  Blood pressure is 149/96, respirations 18, pulse rate 84,  temperature 97.9.  Inspection of the right medial lower extremity shows that the ulcer  continues to decrease in area and volume.  There is no evidence of  ascending infection, abscess, or cellulitis.   ASSESSMENT:  Clinical improvement of stasis ulcer to external  compression.   PLAN:  We are returning the patient to an Radio broadcast assistant.  We will reevaluate  him in 1 week p.r.n.      [name], M.D.  Electronically Signed     HAN/MEDQ  D:  04/27/2007  T:  04/28/2007  Job:  [id number]

## 2010-07-24 NOTE — Assessment & Plan Note (Signed)
[facility]   NAMEGAVINN, COLLARD NO.:  [id number]   MEDICAL RECORD NO.:  [id number]      DATE OF BIRTH:  [birth year]   PHYSICIAN:  Jonelle Sports. Sevier, M.D.       VISIT DATE:                                   OFFICE VISIT   HISTORY:  This 30 year old black male has been followed for a pair of  stasis ulcerations on the right lower extremity, which extremity is  involved in chronic venous insufficiency and also lymphedema.  He has  had lesions both medially and laterally in the mid to distal right calf  areas of that extremity which have been treated with Unna wraps with  progressive healing.   He reports that since his last visit here there has been no pain,  drainage, increased odor, fever or systemic symptoms.   EXAMINATION:  Blood pressure 150/95, pulse 78, respirations 20,  temperature 98.5.   Examination of the leg shows that both wounds, the lateral and medial  wounds, are now healed.  There is some separated outer layer of skin  surrounding the areas where the ulcers eventually healed.  There is no  evidence of significant infection.   IMPRESSION:  Healing of stasis wounds, right lower extremity.   DISPOSITION:  The loosened skin is sharply pared away without incident  over the medial lesion here, and it would appear the ulcer is indeed  truly healed.   PLAN:  The plan will be to allow the patient to return directly home  today, since his edema is now at its lowest ebb, and go immediately into  the new compression hose which he has available there.  He is advised to  wear them every day, putting them on at daybreak before getting out of  bed and taking them off each night at bedtime.   He will be given a p.r.n. return basis here.           ______________________________  Jonelle Sports. Cheryll Cockayne, M.D.     RES/MEDQ  D:  03/04/2007  T:  03/04/2007  Job:  [id number]

## 2010-07-24 NOTE — Assessment & Plan Note (Signed)
[facility]   [name], [name] NO.:  [id number]   MEDICAL RECORD NO.:  [id number]      DATE OF BIRTH:  12-Jan-1981   PHYSICIAN:  Theresia Majors. Tanda Rockers, M.D. VISIT DATE:  02/17/2007                                   OFFICE VISIT   SUBJECTIVE:  [name] is a 30 year old man who we are treating for a  stasis ulcer of the right lower extremity secondary to postphlebitic  state with a history of right iliac vein occlusion.  In the interim, the  patient has been seen at [facility] by Dr. Audrea Muscat and his consult at present  is outstanding.   In the interim, there has been no excessive drainage.  The patient  continues to be ambulatory.   OBJECTIVE:  Blood pressure is 163/92, respirations 20, pulse rate of 88,  temperature is 97.8.  Inspection of the right lower extremity shows that  there is progressive epithelium from the periphery with no evidence of  ascending infection or cellulitis.  The pedal pulse remains palpable.  Capillary refill is brisk.  Edema is reasonably controlled and judged at  2+.   ASSESSMENT:  Clinical response to external compression.   PLAN:  Will continue to place the patient in the [facility] boot.  We have  requested that the consultation report be placed on the chart prior to  his next visit.  We will reevaluate the patient in 1 week.      [name], M.D.  Electronically Signed     HAN/MEDQ  D:  02/17/2007  T:  02/17/2007  Job:  [id number]

## 2010-07-24 NOTE — Assessment & Plan Note (Signed)
[facility]   NAMEDUAYNE, BRIDEAU NO.:  [id number]   MEDICAL RECORD NO.:  [id number]      DATE OF BIRTH:  1980/12/22   PHYSICIAN:  Lenon Curt. Chilton Si, M.D.   VISIT DATE:  05/27/2008                                   OFFICE VISIT   HISTORY:  A 30 year old male returns for reinspection of varicose veins  with ulceration.  The patient says he feels fine and has done well over  the last week.  There has been no fever or chills, and he denies any  odor to the wound.  He has been treated with an Radio broadcast assistant, applied over  Silvercel and hydrogel to the wounds of the right lower leg,  particularly medially.   PHYSICAL EXAMINATION:  Temp 98.3, pulse 75, respirations 21, blood  pressure 132/92.  The wound of the right lower leg has an area of  bridging and really there are 2 wounds at the present time rather than a  much larger single wound which has been previously described.  The  portion of the wound that lies medially appears deeper, and the wound  bed is a bright red color.  Current measurements are 2.0 x 4.0 x 0.2 cm  in depth.  There is a more distal wound of the right medial lower  extremity which now measures 3.0 x 6.0 x a shallow depth.  There is no  foul odor.  There is moderate amount of drainage.  The patient was  started on Septra a week ago.  He continues it and has at least 3 more  days according to his best estimate today.   TREATMENT:  Wound bed was recultured and the upper wound of the lower  extremity.  Previous culture showed MRSA, and I want to be sure that  this is cleaning up because of what appears to be a deepening wound on  the upper portion of the right lower extremity.   Following examination, the patient had Silvercel, hydrogel, and an Unna  boot reapplied and was told to finish Septra and he will return in 1  week for a nurse dressing change and in 2 weeks for a nurse dressing  change and in 3 weeks for a physician  visit.   ICD-9:  454.0.   CPT:  [id number].       Lenon Curt Chilton Si, M.D.  Electronically Signed     AGG/MEDQ  D:  05/27/2008  T:  05/28/2008  Job:  [id number]

## 2010-07-24 NOTE — Assessment & Plan Note (Signed)
[facility]   NAMEZALMAN, HULL NO.:  [id number]   MEDICAL RECORD NO.:  [id number]      DATE OF BIRTH:  [birth year]   PHYSICIAN:  Lenon Curt. Chilton Si, M.D.   VISIT DATE:  03/25/2008                                   OFFICE VISIT   HISTORY:  A 30 year old male returns for reevaluation of severe  bilateral venous stasis wounds, particularly in the right leg.  There  has been significant improvement in these wounds since he was last here,  and the patient feels that he is doing quite well.  He has been treated  with Prisma, hydrogel, and Unna boot, and has responded well to this  treatment.   PHYSICAL EXAMINATION:  Temperature 98.9, pulse 80, respirations 16, and  blood pressure 150/98.  Wound examination shows measurements to be 2.0 x  1.1 x 0.1 of the right medial lower extremity area.  There is no  evidence of purulence, infection, or really much drainage.   TREATMENT:  A right medial calf area wound was treated with Silvercel,  Hypergel, and a repeat application of Unna boot.  He is to return in 1  week for reinspection of the wound and probable reapplication of boot  until full healing takes place.   ICD-9 code 454.0 varicose veins with stasis ulcer.  CPT code 16109.      Lenon Curt Chilton Si, M.D.  Electronically Signed     AGG/MEDQ  D:  03/25/2008  T:  03/26/2008  Job:  [id number]

## 2010-07-24 NOTE — Assessment & Plan Note (Signed)
[facility]   NAMEDADEN, MAHANY NO.:  [id number]   MEDICAL RECORD NO.:  [id number]      DATE OF BIRTH:  June 24, 1980   PHYSICIAN:  Lenon Curt. Chilton Si, M.D.   VISIT DATE:  07/29/2008                                   OFFICE VISIT   HISTORY:  A 30 year old male with chronic leg edema and ulceration of  the right lower leg returns for recheck today.  Wound looked very good  the last time we saw him. We felt this might be a checkout visit.  Unfortunately, the wound seems to have deteriorated in several areas  around the edge of it.  He also did not get the antibiotic prescription  filled that we gave him last time. He says he will do it today.   PHYSICAL EXAMINATION:  Temperature 98.7, pulse 97, respirations 18,  blood pressure 135/83.   Wound measurements of the right medial leg shows the central area of  prior ulceration to be healing or fully healed.  Around the periphery of  the wound, there are several areas of pit-like crevices filled with pus.  We debrided these areas by unroofing them and there was a sheet of thin  cellophane-type skin that was removed from the top of the wound and  there were other areas where the purulent material once the cap was  removed from the crip just rolled out   TREATMENT:  The wounds were debrided with forceps as noted above.  Skin  and slough were removed.  No anesthetic was used.  The patient tolerated  the procedure well.   Following debridement of the right leg, we applied Silvercel, hydrogel,  and an Radio broadcast assistant.  He is to return in 1 week for recheck of this.  There  was a culture done of the right leg wounds.  We encouraged him again to  get the Septra DS one twice daily filled.  He will see the nurse in 1  week for dressing change and the physician in 2 weeks.   ICD-9 CODE:  454.2, varicose vein with ulceration and inflammation.  782.3, edema.   CPT:  [id number] and 16109, selective debridement less  than 20 cm2.      Lenon Curt Chilton Si, M.D.  Electronically Signed     AGG/MEDQ  D:  07/29/2008  T:  07/30/2008  Job:  [id number]

## 2010-07-24 NOTE — H&P (Signed)
NAMEJEFFREY, GRAEFE NO.:  [id number]   MEDICAL RECORD NO.:  [id number]          PATIENT TYPE:  INP   LOCATION:  1823                         FACILITY:  [facility]   PHYSICIAN:  Lucita Ferrara, MD         DATE OF BIRTH:  Jul 24, 1980   DATE OF ADMISSION:  12/27/2007  DATE OF DISCHARGE:                              HISTORY & PHYSICAL   The patient is a 30-year-okd African American male who presents to [facility] after he noticed that his chronic venous stasis  ulcer for which he has been followed up at the [facility] has  been odorous with foul-smelling material and discharge.  The patient is  status post application of Apligraf on October 30, 2007.  Patient is  followed by treatments with Hydrofera Blue and abdominal pad.  The  patient was in his usual state of health until earlier today where he  has experienced quite a lot of pain in the area, erythema that is more  than usual.  He, however, denies any fevers or diaphoresis.  Last time  the area was checked by wound care was on December 22, 2007 by Dr. Ivery Quale where he debrided the area and the necrotic tissue.  Otherwise  review of systems is negative.  His sensations are intact.  He denies  coolness in the area.   PAST MEDICAL HISTORY:  Significant for:  1. Venous stasis ulcer.  2. Morbid obesity.  3. History of recurrent venous stasis ulcers.  4. History of previous lower extremity vein transplantation surgery to      attempt to correct venous insufficiency.   SOCIAL HISTORY:  Denies drugs, alcohol or tobacco.   TRAVEL HISTORY:  No recent travel.   ALLERGIES:  No known drug allergies.   MEDICATIONS:  None.   REVIEW OF SYSTEMS:  As per HPI.  Otherwise negative.   PHYSICAL EXAMINATION:  GENERALLY:  The patient is in no acute distress.  Blood pressure is 152/101, pulse 107, respirations 16, temperature 98.9,  pulse oximetry 100% on room air.  HEENT:  Normocephalic, atraumatic.   Sclerae is anicteric.  NECK:  Supple.  No JVD or carotid bruits.  PERLA.  Extraocular muscles  intact.  CARDIOVASCULAR:  S1, S2.  Regular rate, rhythm.  No murmurs, rubs or  clicks.  LUNGS:  Clear to auscultation bilaterally.  No rhonchi, rales or  wheezes.  ABDOMEN:  Soft, nontender, nondistended.  Positive bowel sounds.  EXTREMITIES:  The right lower leg is very edematous and there is obvious  cellulitis at the level of the knee with foul-smelling drainage from the  distal medial chronic wound with the Apligraf in place.  However, the  neurovascular bundle is intact.  Pulses 2+ bilaterally.  There is no  crepitus.  NEURO:  Patient is alert, oriented x3.  Cranial nerves II-XII grossly  intact.   LABORATORY DATA AND RADIOLOGICAL RESULTS:  The patient had labs  including a basic metabolic panel, which showed low sodium 129.  CBC  shows a white count of 13.8, hemoglobin 12.5,  hematocrit 38.4, platelets  255.  X-ray of the tib fib shows no bony abnormalities.  There is no  __________ .   ASSESSMENT/PLAN:  1. The patient is a 30 year old with:  Chronic venous stasis ulcer, recurrent.  Currently the area is likely  baseline.  His neurovascular bundle is intact.  Good pulses and warmth  in the area.  He does have an increased amount of pain.  He has his  graft in place, but there is malodorous material from the area and there  is increased swelling and erythema.  Likely this is superinfection in  the area.  Thus we will go ahead and admit him to the hospital and  initiate broad-spectrum antibiotics with vancomycin and Zosyn to cover  for gram positives and anaerobes.  I will proceed with Wound Care  consultation although I do believe that if we could get some of his  physicians that are actually at the Wound Care Center that are very  familiar with this case, in to take a look at the area, it would be  beneficial.  If the area does not respond to antibiotics the we will  proceed with  Vascular Surgery consultation and recommendations.  Also I  will proceed with wound culture prior to any antibiotics.  He did not  have any fevers or chills.  There are no signs of systemic infection,  yet we will get a set of blood cultures.  We will repeat his  laboratories in the morning.  He is also somewhat hyponatremic.  This  may be chronic.  We will initiate gentle hydration and we will repeat  his laboratories.  1. The rest of plans will depend on his progress.      Lucita Ferrara, MD  Electronically Signed     RR/MEDQ  D:  12/27/2007  T:  12/27/2007  Job:  [id number]

## 2010-07-24 NOTE — Assessment & Plan Note (Signed)
[facility]   [name], [name] NO.:  [id number]   MEDICAL RECORD NO.:  [id number]      DATE OF BIRTH:  [birth year]   PHYSICIAN:  Leonie Man, M.D.    VISIT DATE:  09/26/2008                                   OFFICE VISIT   PROBLEM:  Venous leg ulcers on the right lower extremity, measuring 4.5  x 1 x 0.1 and 0.6 x 0.5 x 0.2.   The patient is a 30 year old morbidly obese male with venous stasis  disease.  His wounds have been healing satisfactorily, and most recent  treatment, he has been using gentamicin cream and calamine Unna boots  for treatment.   EXAMINATION:  On today's exam, his temperature is 98.8, pulse 84,  respirations 16, blood pressure 158/82.  The wounds are pink and granulating without any evidence of necrosis or  slough.  The wound edges are clean.  There is significant  epithelialization occurring over the wound bed to its current size.   TREATMENT:  Continue gentamicin ointment and calamine Unna boot.  Follow  up in 1 week.      Leonie Man, M.D.  Electronically Signed     PB/MEDQ  D:  09/26/2008  T:  09/26/2008  Job:  [id number]

## 2010-07-24 NOTE — Assessment & Plan Note (Signed)
[facility]   NAMECHAMAR, BROUGHTON NO.:  [id number]   MEDICAL RECORD NO.:  [id number]      DATE OF BIRTH:  11-Jul-1980   PHYSICIAN:  Theresia Majors. Tanda [name], M.D. VISIT DATE:  01/20/2007                                   OFFICE VISIT   SUBJECTIVE:  [name] is a 30 year old man who was initially seen in  the [facility] Center January 06, 2007.  During his initial evaluation a  diagnosis of a postphlebitic syndrome with a stasis ulcer in the right  lower extremity was made.  The patient was started on an Radio broadcast assistant  protocol.  We obtained a history suggesting surgery had been performed  on his right lower extremity but the patient was uncertain as to the  nature of this surgery.  In the interim we have requested and received  contrast studies and a surgical report from [facility] in  [place], [place].  This report details a comprehensive venous workup  and a diagnosis of a right iliac vein stenosis.  The patient underwent a  saphenous vein transposition and apparently had some initial success.  The ulcers remained under control until several months before his  initial visit to the Wound Center.   In the interim he has had the Unna boot in place, the edema has been  reasonably well-controlled, there has been moderate drainage and  moderate malodor, there is been no fever.  The patient continues to be  ambulatory and has not missed any days from work.   OBJECTIVE:  Blood pressure is 154/94, respirations are 14, pulse rate  78, temperature is 97.6.  Inspection of the right lower extremity shows  that the ulcer has improved with a contracting base and healthy-  appearing granulation with evidence of advancing epithelium from the  periphery.  The edema is still 2+, there is no cellulitis, there is no  abscess.  Pedal pulse remains palpable.   ASSESSMENT:  Postphlebitic syndrome secondary to right iliac stenosis.   PLAN:  We are  referring this patient to Dr. Tobi Bastos at [facility],  Division of Vascular Surgery for evaluation and possible venous stenting  and/or reconstruction.   We have explained our clinical impression to the patient in terms that  he seems to understand, we have also explained to him that he will be  discharged from an active the patient from the Wound Center but we will  continue to dress his wound on a weekly basis until he has been  definitively seen by Dr. Jacolyn Reedy.  We have given the patient an  opportunity to ask questions.  He seems to understand the clinical  impression and the recommendation and is anxious to proceed.      [name], M.D.  Electronically Signed     HAN/MEDQ  D:  01/20/2007  T:  01/20/2007  Job:  [id number]

## 2010-07-24 NOTE — Discharge Summary (Signed)
[name], [name]                 ACCOUNT NO.:  [id number]   MEDICAL RECORD NO.:  [id number]          PATIENT TYPE:  INP   LOCATION:  5011                         FACILITY:  [facility]   PHYSICIAN:  Lonia Blood, M.D.      DATE OF BIRTH:  08/10/1980   DATE OF ADMISSION:  12/27/2007  DATE OF DISCHARGE:  01/01/2008                               DISCHARGE SUMMARY   PRIMARY CARE PHYSICIAN:  The patient was unassigned and was to follow up  at the W[facility] Care Center.   DISCHARGE DIAGNOSES:  1. Cellulitis with venous stasis ulcer of the right lower extremity.  2. Morbid obesity.  3. Venous insufficiency.  4. Transient headaches.  5. Leukocytosis.  6. Hypokalemia.   DISCHARGE MEDICATIONS:  1. Septra double strength 1 daily for 7 days.  2. Ciprofloxacin 500 mg daily for 7 days.  3. Percocet 10/325 one tablet q.4 h. p.r.n.   DISPOSITION:  The patient was to continue his followup at the [facility]  He had an appointment on Tuesday October 26 for  continued wound care.  He was also to get some bariatric bed.   PROCEDURE PERFORMED:  X-ray of his tibia and fibula on October 18 that  showed no acute bony abnormalities.   CONSULTATIONS:  Wound care consult.   BRIEF HISTORY AND PHYSICAL:  Please refer to dictated history and  physical by Dr. Flonnie Overman.  In short, however, the patient is a 30 year old  [place] male that presented with pain of the right lower  extremity.  He has been going to [facility] Care Center for venous ulcer.  It  has gotten however worse and was odorous and foul smell as well as  discharge.  Hence, he came to the emergency room.  His leg was slightly  red and swollen probably worse than what it has been, but no fever or  chills.  He was subsequently admitted for further management.   HOSPITAL COURSE:  Venous stasis ulcer.  The patient was admitted,  started on antibiotics IV, vancomycin, and Zosyn.  Subsequent wound care  was also consulted and  subsequently transitioned to oral antibiotics.  His wound culture was performed indicating multiple organisms including  MRSA Pseudomonas aeruginosa.  Based on the  sensitivities to both the MRSA and Pseudomonas, the patient is being  discharged on both Cipro and Septra.  Otherwise, the rest of his medical  problems are quite chronic and stable and the main thing is for the  patient to continue antibiotics and wound care.      Lonia Blood, M.D.  Electronically Signed     LG/MEDQ  D:  04/14/2008  T:  04/14/2008  Job:  [id number]

## 2010-07-24 NOTE — Assessment & Plan Note (Signed)
[facility]   NAMEAMUN, STEMM NO.:  [id number]   MEDICAL RECORD NO.:  [id number]      DATE OF BIRTH:  04/14/1980   PHYSICIAN:  Lenon Curt. Chilton Si, M.D.   VISIT DATE:  10/07/2008                                   OFFICE VISIT   HISTORY:  A 30 year old male with chronic and recurrent ulcerations of  the right leg related to severe edema returns for recheck today.  Wounds  seem to actually have done better since we last saw him a week ago.  There has been no odor of the wounds.  They are not tender.  There is  less drainage.   Preliminary report from the culture done on September 30, 2008, returned on  October 06, 2008, showing moderate group B strep, Strep agalactiae.  Dr.  Talmage Nap signed off on the record and prescribed ampicillin 500 mg t.i.d.  for 10 days.  I cannot see this was called into the drugstore.  The  patient says he was unaware of any of these results and has not started  on the ampicillin   PHYSICAL EXAMINATION:  Temperature 98.5, pulse 86, respirations 20, and  blood pressure 139/72.  Wound measurements of the right medial leg now  4.0 x 1.6 x less than 0.1.  These roughly correspond to those that were  obtained last visit.  The wound bases are beefy red color.  There is no  evidence of infection.  There is no odor and there is no tenderness.   TREATMENT:  Prescription was written for ampicillin 500 mg, 30, one 3  times daily for infection.  We applied gentamicin ointment to his  wounds, covered with Adaptic and ABD pad and then an Unna boot.  He will  return in 1 week for reevaluation.   ICD-9 code 454.2 varicose veins with ulcer and inflammation.   CPT code 27253.      Lenon Curt Chilton Si, M.D.  Electronically Signed     AGG/MEDQ  D:  10/07/2008  T:  10/08/2008  Job:  [id number]

## 2010-07-24 NOTE — Assessment & Plan Note (Signed)
[facility]   NAMEANACLETO, BATTERMAN NO.:  [id number]   MEDICAL RECORD NO.:  [id number]      DATE OF BIRTH:  [birth year]   PHYSICIAN:  Lenon Curt. Chilton Si, M.D.   VISIT DATE:  09/09/2008                                   OFFICE VISIT   HISTORY:  A 30 year old male with swollen legs and chronic ulceration of  the right lower leg returns today for concerns that it is draining more.  He denies any fever, chills, pain, or foul odor.   PHYSICAL EXAMINATION:  The chronically ulcerated area of the right leg  had healed over fully approximately 1 month ago and he was discharged  from clinic.  The wound area continues to look fairly good to me, but  there is a softening of an area approximately 0.5 x 1.0 cm in the  posterior third of the wound.  There is a little fluid sac and a yellow  scab-like material that this on top of this.  There is no odor and there  are no signs of infection, although the wound previously has had  infections.  The edges of the wound otherwise look good at the present  time.   TREATMENT:  Applied Adaptic on top of the area of suspect ulceration and  then an Unna boot on top of that.  He will return next week for  reinspection of the wound.   ICD-9 CODE:  454.2, varicose vein with ulcer and inflammation.   CPT CODE:  04540.      Lenon Curt Chilton Si, M.D.  Electronically Signed     AGG/MEDQ  D:  09/09/2008  T:  09/10/2008  Job:  [id number]

## 2010-07-24 NOTE — Assessment & Plan Note (Signed)
[facility]   NAMEKATSUMI, WISLER NO.:  [id number]   MEDICAL RECORD NO.:  [id number]      DATE OF BIRTH:  01/02/1981   PHYSICIAN:  Lenon Curt. Chilton Si, M.D.        VISIT DATE:                                   OFFICE VISIT   HISTORY:  A 30 year old male with chronic venous insufficiency, ulcers,  and inflammation of the right leg returns for a recheck today.  Previous  treatments have included Silvercel, hydrogel , and a boot.  The patient  says that the drainage from the wound began to stink since he was last  seen a week ago.  He had a birthday and he could not go out on his  birthday because of the odor to the wound, drainage increased.  There  was some increasing discomfort as well.  He denies any fever or chills.   On exam, temperature 98.3, pulse 80, blood pressure 164/117.  Wound  measurements were done prior to debridement and was 4.3 x 4.7 x 0.2.  After debridement, the wound measured actually 10.0 x 5.0 x 0.2 cm  depth.  The increase in size of the wound was because of a thick  eschar/callus material that was present within the covered portion of  the wound.  There was a significant pool of fluid underneath this area  and granulation tissue appears to be forming actually in this area.  There was not a particular strong odor on examination of the wound.   TREATMENT:  Sharp debridement of the right calf and leg wound was done  with scissors and forceps.  There was no bleeding encountered.  No  anesthesia was applied.  The callus/eschar that was removed opened the  wound up.  This was a selective debridement of 20 sq. cm or less.  The  wound overall appeared deteriorated from the last visit.   Following debridement, we reapplied Silvercel, hydrogel, and a boot.   The patient is currently taking Cipro 250 mg twice daily and doxycycline  100 mg twice daily, which he started on his own from leftover tablets  from previous prescriptions.   He says he has enough to last a week.   He is return in 1 week for reevaluation of the wound.   ICD-9 #454.0, varicose veins with stasis ulcer.   ICD-9 #401.9, hypertension.   CPT code 40981 and [phone], selective debridement of less than 20 sq. cm  and a boot was applied.      Lenon Curt Chilton Si, M.D.  Electronically Signed     AGG/MEDQ  D:  04/08/2008  T:  04/09/2008  Job:  [id number]

## 2010-07-24 NOTE — Assessment & Plan Note (Signed)
[facility]   [name], CLINK NO.:  [id number]   MEDICAL RECORD NO.:  [id number]      DATE OF BIRTH:  26-Dec-1980   PHYSICIAN:  Lenon Curt. Chilton Si, M.D.   VISIT DATE:  07/01/2008                                   OFFICE VISIT   HISTORY:  Two-week physician followup for recheck of chronic varicose  vein ulceration of the right leg.  The patient has been having Unna  boots Silvercel, and hydrogel applied on a weekly basis.  Wound is  shrinking up.  There is less drainage.  Upon removal of the Unna boot  today, there was some minimal green drainage present.  No discomfort.  No significant inflammation.  Wound measurements have decreased.   EXAM:  The wound has shrunk up considerably since my last visit.  There  is no inflammation and no drainage.   TREATMENT:  Reapplied Silvercel, hydrogel, and Unna boot.  This is to be  repeated in 1 week by the nurse and he will have physician check in 2  weeks.   ICD-9 code 454.0.  CPT code 16109.      Lenon Curt Chilton Si, M.D.  Electronically Signed     AGG/MEDQ  D:  07/01/2008  T:  07/01/2008  Job:  [id number]

## 2010-07-24 NOTE — Assessment & Plan Note (Signed)
[facility]   NAMENEO, YEPIZ NO.:  [id number]   MEDICAL RECORD NO.:  [id number]      DATE OF BIRTH:  12-Mar-1980   PHYSICIAN:  Theresia Majors. Tanda [name], M.D.      VISIT DATE:                                   OFFICE VISIT   SUBJECTIVE:  [name] is a 30 year old man with stasis ulcer involving his  right lower extremity. In the interim we have treated him with  compression wraps. He returns for follow-up.  There has been no interim  drainage, malodor, pain or fever.   OBJECTIVE:  VITALS:  Blood pressure is 155/95, respirations 16, pulse  rate 96, temperature is 98.6.  Inspection of the right lower extremity  shows that the ulcer is completely resolved.  There is a persistence of  1+ edema bilaterally.  The pedal pulses readily palpable.   ASSESSMENT:  Resolved stasis ulcer.   PLAN:  We are discharging the patient with instructions that he is to  procure and wear bilateral below-the-knee 30-40 mm open toed support  hose. He is to continue his follow-up with his primary care physician,  particularly attention to his hypertension.  We have given the patient  an opportunity to ask questions.  He seems to understand and indicates  that he will be compliant.  He expresses gratitude for having been seen  in the clinic.      [name], M.D.  Electronically Signed     HAN/MEDQ  D:  05/18/2007  T:  05/18/2007  Job:  [id number]   cc:   Louanna Raw

## 2010-07-24 NOTE — Assessment & Plan Note (Signed)
[facility]   [name], [name] NO.:  [id number]   MEDICAL RECORD NO.:  [id number]      DATE OF BIRTH:  Aug 07, 1980   PHYSICIAN:  Lenon Curt. Chilton Si, M.D.   VISIT DATE:  09/16/2008                                   OFFICE VISIT   HISTORY:  A 31 year old male returns for recurrent ulceration of the  right lower leg associated with known venous stasis problems.  The  patient was seen last week for relapse.  He is done well over this week.  The areas of ulceration are now healing.  The patient seems to do well  with his Unna boot.   PHYSICAL EXAMINATION:  Temperature 98.3, pulse 90, respirations 19,  blood pressure 139/80.  Wound measurement of the right medial lower  extremity now 5.3 x 1.0 x 0.1 cm tapering at each.  There was no  significant erythema.  There is some drainage.  The patient says it is  become quite tender and there are some evidence of discomfort when I  palpate around it, although are no soft spots or edges like a  subcutaneous abscess.   TREATMENT:  Applied gentamicin ointment, Adaptic overlying that where  the ulcers are and then an Radio broadcast assistant.  Prescribed tramadol 50 mg 50  tablets one 3 times daily if needed for pain.  Next physician  appointment will be in 1 week for reapplication of the Unna boot and re-  inspection of the wound.   ICD-9 454.2 varicose vein with ulcer and inflammation.   CPT L6338996.      Lenon Curt Chilton Si, M.D.  Electronically Signed     AGG/MEDQ  D:  09/16/2008  T:  09/17/2008  Job:  [id number]

## 2010-07-24 NOTE — Assessment & Plan Note (Signed)
[facility]   NAMESIGIFREDO, PIGNATO NO.:  [id number]   MEDICAL RECORD NO.:  [id number]      DATE OF BIRTH:  Apr 25, 1980   PHYSICIAN:  Lenon Curt. Chilton Si, M.D.   VISIT DATE:  04/15/2008                                   OFFICE VISIT   HISTORY:  A 30 year old male returns for continued treatment of varicose  veins with stasis dermatitis and ulcers of his right leg in the calf  area.  The patient has done fairly well since he was last seen.  He was  started on trimethoprim and sulfa twice daily for a 14-day course of  treatment based on evidence of MRSA and wound culture obtained on  April 08, 2008.  He has finished his doxycycline and Cipro that he was  previously given.  The methicillin-resistant Staphylococcus aureus  sensitivities responded to the Cipro and doxycycline, as well as a  trimethoprim and sulfa.  The patient has had a fair amount of itching  since he was last seen.  There has been continued drainage.   EXAMINATION:  Temperature 98.6, pulse 94, respirations 20, blood  pressure 135/87.  Wound measurements of the right medial lower extremity  were 2 cm x 3.5 cm x 0.1 cm.  Previous raw area that was evident after  unroofing the thickened scabby material has really done well over the  last week.  Modest amount of drainage persists due to the chronic edema  of his legs.   TREATMENT:  Reapplied Silvercel, hydrogel, and Unna boot.  This should  be done weekly.  The patient was advised to continue his trimethoprim  and sulfa to complete the treatment.   ICD-9, 454.0 varicose veins with ulcer.  CPT [id number].      Lenon Curt Chilton Si, M.D.  Electronically Signed     AGG/MEDQ  D:  04/15/2008  T:  04/16/2008  Job:  [id number]

## 2010-07-24 NOTE — Assessment & Plan Note (Signed)
[facility]   [name], [name] NO.:  [id number]   MEDICAL RECORD NO.:  [id number]      DATE OF BIRTH:  12/15/1980   PHYSICIAN:  [name], M.D. VISIT DATE:  02/29/2008                                   OFFICE VISIT   [name] is a gentleman who we have been following for a large venous  stasis ulcer of the right leg.  He was last seen here on February 19, 2008.  At that point in time, he was switched from a Profore base wrap  to an Unna with Prisma underneath.  He has apparently been doing well  with no undue pain or fever.   On examination, the wound is nicely resolving.  There are still 2 open  areas that we are defining, both of them look clean.  There is no  evidence of infection and nothing needed debriding.   IMPRESSION:  Venous stasis ulcers of the right leg.  We have reapplied  Prisma with hydrogel under an Unna wrap.  We will see this gentleman in  a week for wrap changes.  I suspect he is on his way to resolving,  hopefully within the next week or 2.           ______________________________  [name], M.D.     MGR/MEDQ  D:  02/29/2008  T:  02/29/2008  Job:  [id number]

## 2010-07-24 NOTE — Assessment & Plan Note (Signed)
[facility]   NAMEGREENE, DIODATO NO.:  [id number]   MEDICAL RECORD NO.:  [id number]      DATE OF BIRTH:  1980-05-16   PHYSICIAN:  Lenon Curt. Chilton Si, M.D.   VISIT DATE:  04/01/2008                                   OFFICE VISIT   HISTORY:  A 30 year old male returns today for re-inspection of varicose  veins ulceration of the right leg posteriorly in the calf area.  The  patient has been having Unna boot supplied as well as Silvercel cream  and Hypergel.  He is tolerating the procedure well.  Wound is about the  same size as we saw it last week.  There appears to be some granulation  tissue now forming in the base of the ulceration.   PHYSICAL EXAMINATION:  Temperature 98.7, pulse 89, respirations 20,  blood pressure 155/97.  Wound measurements 1.8 x 1.3 x 0.2 cm, and there  may actually be a little deepening of this wound in the very central  portion.  There is no surrounding erythema.  There is significant  drainage.  Legs continue to be swollen with chronic edema and skin  changes related to his chronic venous insufficiency problems.   TREATMENT:  I re-applied Silvercel, Hypergel,  and the Unna boot to the  right medial calf area posteriorly.  The patient will return to 1 week  for removal of Unna boot and re-inspection of the wound.   ICD-9 NUMBER:  454.0 varicose veins with stasis ulcer.   CPT CODE NUMBER:  99213.      Lenon Curt Chilton Si, M.D.  Electronically Signed     AGG/MEDQ  D:  04/01/2008  T:  04/02/2008  Job:  [id number]

## 2010-07-24 NOTE — Assessment & Plan Note (Signed)
[facility]   NAMEFOUNT, BAHE NO.:  [id number]   MEDICAL RECORD NO.:  [id number]      DATE OF BIRTH:  07/29/1980   PHYSICIAN:  Leonie Man, M.D.    VISIT DATE:  11/07/2008                                   OFFICE VISIT   PROBLEM:  Chronic venous stasis disease with small venous leg ulcers on  the right inner calf 0.5 x 2.8 x 0.1.  This has been treated with  topical gentamicin, Adaptic, and ABD under an Unna boot for this venous  stasis ulcer when the patient was last seen.  He returns today with new  and significant changes to his foot for reevaluation.   PHYSICAL EXAMINATION:  VITAL SIGNS:  Temperature is 98.8, pulse 86,  respirations 18, and blood pressure 144/86.  EXTREMITIES:  The wound base is clean.  There is no odor or drainage.  The patient has not been having any chills or fevers.   TREATMENT TODAY:  Prisma, hydrogel, and Unna boot.  Follow up will be in  1 week.      Leonie Man, M.D.  Electronically Signed     PB/MEDQ  D:  11/07/2008  T:  11/08/2008  Job:  [id number]

## 2010-07-24 NOTE — Assessment & Plan Note (Signed)
[facility]   NAMEQUINLAN, VOLLMER NO.:  [id number]   MEDICAL RECORD NO.:  [id number]      DATE OF BIRTH:  [birth year]   PHYSICIAN:  Maxwell Caul, M.D. VISIT DATE:  11/09/2007                                   OFFICE VISIT   Mr. Dobosz returns now 1 week status post Apligraf placement for  refractory venous stasis ulcer on his right leg.  He actually missed an  appointment last week and therefore, we are perhaps 10 days out from the  Apligraf placement.   On examination, he is afebrile.  The wound bed looks satisfactory.  There has been a slight reduction in the overall dimension here.  There  is healthy looking granulation, but still some depth of the wound.   IMPRESSION:  Refractory venous stasis ulcer.  We applied Hydrofera Blue  and continue him in compression wrap.  We will see him again next week  and look forward to continue the Apligraf effects.           ______________________________  Maxwell Caul, M.D.     MGR/MEDQ  D:  11/09/2007  T:  11/10/2007  Job:  [id number]

## 2010-07-24 NOTE — Assessment & Plan Note (Signed)
[facility]   NAMECLEAVEN, DEMARIO NO.:  [id number]   MEDICAL RECORD NO.:  [id number]      DATE OF BIRTH:  12-May-1980   PHYSICIAN:  Maxwell Caul, M.D. VISIT DATE:  09/18/2007                                   OFFICE VISIT   Mr. Thwaites is a gentleman with significant venous stasis and postphlebitic  syndrome with recurrent stasis ulceration.  He had recently been healed;  however, he was seen a month ago by the doctor with regards to reopening  on his right leg.  He has been wearing an Unna with TCA to the stasis-  type skin.   On examination, his temperature is 98.4, pulse 91, and blood pressure  164/93.  The area on his right medial leg measures 2.3 x 5.5 x 0.5.  The  original diameter of this wound has been reduced by about 50% and you  can see healing skin; however, the wound itself is granulated, but very  soupy with a lot of drainage.  There is no evidence of surrounding  cellulitis.   IMPRESSION:  Significant venous stasis with recurrent venous stasis  ulceration.  To help with the drainage and bioburden, I have applied  hydrophila blue to the base of the wound.  We will continue with the  Unna wraps.  He can be seen next week in the [facility] for redressing  and we will see him again in 2 weeks' time.           ______________________________  Maxwell Caul, M.D.     MGR/MEDQ  D:  09/18/2007  T:  09/19/2007  Job:  [id number]

## 2010-07-24 NOTE — Assessment & Plan Note (Signed)
[facility]   [name], [name] NO.:  [id number]   MEDICAL RECORD NO.:  [id number]      DATE OF BIRTH:  [birth year]   PHYSICIAN:  Maxwell Caul, M.D.      VISIT DATE:                                   OFFICE VISIT   [name] is a gentleman we have been following for severe bilateral  venous stasis, particularly on his right leg.  He initially had a large  venous stasis ulceration on the medial aspect of his right leg.  He also  has peripheral vascular disease and has a stent placement in his right  question femoral artery.  In any case, the wound has been progressively  healing.  In his right leg, we have been using a collagen-based  dressings under an Unna wrap.  He is due to go to biotech to get  measured for graded pressure stockings.  At that point, I hope to be  able to transition him.   On examination, the areas in question are continuing to epithelialized.  There are small areas remaining.  We have continued with Prisma,  hydrogel under the Unna wraps.  He will be seen again next week.  I  believe they will have to remove the wrap to actually measure him and he  will return here to be re-wrapped.  When he obtains his stockings, he  can be transitioned but likely not before then.           ______________________________  Maxwell Caul, M.D.     MGR/MEDQ  D:  03/18/2008  T:  03/19/2008  Job:  [id number]

## 2010-07-24 NOTE — Assessment & Plan Note (Signed)
[facility]   NAMECASSEY, HURRELL NO.:  [id number]   MEDICAL RECORD NO.:  [id number]      DATE OF BIRTH:  1980/03/12   PHYSICIAN:  Theresia Majors. Tanda Rockers, M.D.      VISIT DATE:                                   OFFICE VISIT   SUBJECTIVE:  Mr. Weed is a 29 year old man with postphlebitic syndrome  and stasis ulceration involving his right lower extremity.  In the  interim, we have treated him with an Radio broadcast assistant.  There has been no  excessive drainage, malodor, pain or fever.   OBJECTIVE:  Blood pressure is 172/88, respirations 18, pulse rate 68,  temperature 98.2.   Inspection of the lower extremity shows that there is 100% granulation  with advancing epithelium from the periphery.  There is no evidence of  ascending infection or ischemia.   ASSESSMENT:  Satisfactory response to compression.   PLAN:  We will resume the Unna boot and reevaluate the patient in one  week.  The patient will continue to pursue his consultation with the  wound center at [facility]      [name], M.D.  Electronically Signed     HAN/MEDQ  D:  02/03/2007  T:  02/03/2007  Job:  [id number]

## 2010-07-24 NOTE — Assessment & Plan Note (Signed)
[facility]   NAMECINDY, BRINDISI NO.:  [id number]   MEDICAL RECORD NO.:  [id number]      DATE OF BIRTH:  [birth year]   PHYSICIAN:  Jonelle Sports. Sevier, M.D.  VISIT DATE:  02/10/2008                                   OFFICE VISIT   HISTORY:  This 30 year old black male who is morbidly obese and has both  chronic venous and edema and lymphedema of the lower extremities has  been treated for fairly refractory stasis ulcer of the medial aspect of  the right distal lower extremity.  Although it has gotten near healing,  it weeps so profusely that it has never completely healed.  At the visit  1 week ago, Dr. [name] thought that perhaps there was some greater area  of tissue at risk than what appeared simply to be open.   He has been treated with silver alginate, XtraSorb pads, etc., and an  Unna wrap.   He returns today reporting new problems.  It is ascertained from his  history that he has not been using his pumps as he is supposed to do at  least 30 minutes twice daily and rather he has been using them utmost 3  times per week.   PHYSICAL EXAMINATION:  Blood pressure is 150/98, pulse 78, respirations  19, temperature 98.8.  The wound on the medial aspect of the right lower  extremity measured 1.0 x 0.8 x 0.1 cm and is soft and weepy in its  center.  There is no adjacent fluctuance.  No odor of the wound, and the  weeping material is serosanguineous.   IMPRESSION:  Chronic stasis ulcer, right lower extremity unlikely to  improve until we can get some degree of improvement in his massive  edema.   DISPOSITION:  1. The primary wound area is sharply debrided of some of the soft      rather macerated tissue there.  The ultimate size of the ulcer is      approximately 1.0 x 0.8 x 0.15.  2. There are 2 other areas of encrustation more medial to this primary      wound and when those crusts are removed indeed he has 2 other      weeping areas  as well, much smaller in size.  3. The wounds were then dressed with an application of silver alginate      to each of these 3 areas, 2 of which are new.  Again, the area is      covered with an Xtrasorb pad and he is placed in a Profore wrap,      which in turn is reinforced with ABD pads.  4. He is instructed to use his venous pump, no less than 30 minutes      twice daily.  He is to stay off his feet as much as possible.  5. The wrap has been changed from an Unna wrap to a Profore wrap as      indicated above.  6. Return visit here will be in 9 days.           ______________________________  Jonelle Sports Cheryll Cockayne, M.D.     RES/MEDQ  D:  02/10/2008  T:  02/11/2008  Job:  [phone]

## 2010-07-24 NOTE — Assessment & Plan Note (Signed)
[facility]   NAMESATCHEL, HEIDINGER NO.:  [id number]   MEDICAL RECORD NO.:  [id number]      DATE OF BIRTH:  Oct 09, 1980   PHYSICIAN:  Barry Dienes. Eloise Harman, M.D. VISIT DATE:  01/12/2008                                   OFFICE VISIT   SUBJECTIVE:  The patient is a 30 year old man of African American  descent who was seen for evaluation of a chronic right leg venous stasis  ulcer.  He is status post application of Apligraf on October 30, 2007 and  has been treated with a silver alginate dressing covered by Anola Gurney,  then an [employer], which is changed once weekly.  He has not had  significant pain or fever or chills and has had less drainage from the  wound.  He has markedly reduced the amount of sweetened sodas that he  consumes.   OBJECTIVE:  VITAL SIGNS:  Blood pressure 147/114, pulse 96, respirations  18, and temperature 98.4.  EXTREMITIES:  The condition of the wound is as follows:  Wound #3:  Located on the medial aspect of the right leg and measures  2.5 cm x 5 cm x 0.2 cm.  This wound does not have significant eschar and  has a minimal amount of necrotic material within it.  The wound base is  red and yellow in color with some red granulation tissue, no exposed  bone, tendon, or muscle, no foul odor, and a moderate amount of  serosanguineous discharge with an intact periwound integrity.   ASSESSMENT:  Interval healing of right leg venous stasis ulcer.  Consistently elevated blood pressure, that needs treatment.   PLAN:  The right leg had the Unna wrap removed and was washed with soap  and water.  A #15 scalpel was used to debride the small amount of  necrotic material from the wound.  This was not associated with  significant pain or bleeding.  I discussed the importance of good blood  pressure control with Mr. Nimmons.  We have added to Zestoretic 10/12.5 to  his regimen.  He will take this just prior to beginning his nightly work  shift to help better control his blood pressure and improve leg  swelling.  He was advised that common side effect is coughing, and then  he should call if this occurs with him.  We will consider checking a  basic metabolic panel at his next office visit to assess any possible  effects on serum potassium or BUN and creatinine.  Eventually, we will  obtain a scale that can check interval weights on him as he states that  his weight is above 400 pounds.  To the right leg ulcer, we applied a  silver alginate dressing covered by Anola Gurney and a Unna boot.  He will  return in approximately 1 week for a physician reevaluation.           ______________________________  Barry Dienes Eloise Harman, M.D.     DGP/MEDQ  D:  01/12/2008  T:  01/12/2008  Job:  [id number]

## 2010-07-24 NOTE — Assessment & Plan Note (Signed)
[facility]   NAMEHENRYK, [name] NO.:  [id number]   MEDICAL RECORD NO.:  [id number]      DATE OF BIRTH:  March 16, 1980   PHYSICIAN:  Theresia Majors. Tanda Rockers, M.D. VISIT DATE:  05/04/2007                                   OFFICE VISIT   SUBJECTIVE:  [name] is a 30 year old man who we are following for  stasis ulceration involving his right lower extremity.  We have treated  him with a Radio broadcast assistant.  He continues to be ambulatory.  There is decrease  in drainage.  No malodor and no temperature.   OBJECTIVE:  VITAL SIGNS:  Blood pressure is 158/95, respirations 18,  pulse rate 78, temperature is 97.1.  MUSCULOSKELETAL:  Inspection of the right medial lower extremity shows  that the ulcer is 99% resolved.  The edema is well controlled with the  compression wrap.   ASSESSMENT:  Clinical improvement of the stasis ulcer.   PLAN:  We will return the patient to an Unna boot in anticipation that  he will be healed in one week.      [name], M.D.  Electronically Signed     HAN/MEDQ  D:  05/04/2007  T:  05/04/2007  Job:  [id number]

## 2010-07-24 NOTE — Assessment & Plan Note (Signed)
[facility]   [name], [name] NO.:  [id number]   MEDICAL RECORD NO.:  [id number]      DATE OF BIRTH:  Jun 03, 1980   PHYSICIAN:  Barry Dienes. Eloise Harman, M.D. VISIT DATE:  01/05/2008                                   OFFICE VISIT   SUBJECTIVE:  The patient is a 30 year old African American man with a  chronic right leg venous stasis ulcer.  He is status post application of  Apligraf on October 30, 2007, and most recently has been treated with a  non-adherent dressing covered by Anola Gurney, covered by an [employer], and  change once weekly.  On December 27, 2007, he had fever and chills and  presented to the emergency room for evaluation.  He reportedly was  diagnosed with significant cellulitis and treated with IV antibiotics  that has been switched to ciprofloxacin and a cephalosporin.  He has not  had recurrent fevers and does not have diarrhea.  He has been eating  reasonably well, although his mother notes that he consumes a large  amount of sweetened sodas.   OBJECTIVE:  VITAL SIGNS:  Blood pressure 140/85, pulse 99, respirations  20, and temperature 97.4.   The condition of the wounds is as follows:  This is wound #3, which is  located on the right medial lower extremity and measures 3.5 cm x 8 cm x  0.2 cm.  There is some partial coverage of the wound with eschar and a  moderate amount of necrotic tissue in the ulcer and in the surrounding  tissues.  The wound base is red and yellow in color, and there is some  red granulation tissue.  There is no exposed bone, tendon, or muscle,  and no foul odor.  There was a moderate amount of serosanguineous  discharge, and there was some maceration of the tissue surrounding the  ulcer.   During his hospitalization, he had several labs and studies done  including the following:  Right lower extremity venous ultrasound exam showed no evidence of DVT,  and the left lower extremity showed no evidence  of DVT.  He also had x-  rays of the right tibia and fibula that showed no evidence of  osteomyelitis.   LABORATORY TESTS:  Serum sodium 136, potassium 3.9, chloride 103, carbon  dioxide 27, glucose 117, BUN 6, and creatinine 0.86.  White blood cell  count 11.1 with hemoglobin 10.9, hematocrit 32.6, and platelets 289 with  77% neutrophils.   Wound cultures from the right leg groove few Pseudomonas aeruginosa that  was pansensitive and rare methicillin-resistant Staphylococcus aureus  that was sensitive to CLINDAMYCIN, GENTAMICIN, LEVOFLOXACIN, RIFAMPIN,  SEPTRA, VANCOMYCIN, and TETRACYCLINE, and resistant to ERYTHROMYCIN and  OXACILLIN.   ASSESSMENT:  Slowly healing right leg stasis ulcer with no current  evidence of cellulitis.   PLAN:  After topical anesthesia was obtained with 5% lidocaine gel, the  ulcer and the surrounding necrotic tissue were removed with a #15  scalpel without significant pain or bleeding.  He tolerated the  procedure well.  The ulcer was then covered with a silver alginate  dressing, covered by Anola Gurney, and then covered by an [name] boot.  He  will be seen in the clinic  by a physician in approximately 1 week.           ______________________________  Barry Dienes. Eloise Harman, M.D.     DGP/MEDQ  D:  01/05/2008  T:  01/06/2008  Job:  [id number]

## 2010-07-24 NOTE — Group Therapy Note (Signed)
NAMERIGHTEOUS, CLAIBORNE                 ACCOUNT NO.:  [id number]   MEDICAL RECORD NO.:  [id number]          PATIENT TYPE:  REC   LOCATION:  FOOT                         FACILITY:  [facility]   PHYSICIAN:  Barry Dienes. Eloise Harman, M.D.DATE OF BIRTH:  1981/01/18                                 PROGRESS NOTE   SUBJECTIVE:  The patient is a 30 year old African American man with a history of  postherpetic syndrome and a right leg stasis ulcer.  He is status post  application of Apligraf on October 30, 2007, followed by treatment with  Hydrofera blue, an ABD pad and an Unnaboot changed once weekly.  He has  not had significant pain in the right leg and has not had any recent  fever or chills.  Earlier today he hit the left side of his forehead  against his trunk without bleeding or loss of consciousness.  He has  mild pain in that area with no change in his vision or nausea or  vomiting.   OBJECTIVE:  VITAL SIGNS:  Blood pressure 154/81, pulse 87, respirations 18,  temperature 99.  GENERAL:  In general he is an overweight black man who was in no  apparent distress while sitting upright.  HEENT:  Exam was within normal limits.  NEUROLOGICAL:  He had normal cognition and moved all extremities well.  SKIN:  Along the medial aspect of the right leg there is a superficial  ulcer measuring 1.2 cm x 4.0 cm x 0.25 cm.  The ulcer does not have  covering eschar or significant necrotic debris.  The wound base is red  in color as is the granulation tissue.  There was no exposed bone,  tendon or muscle.  There is a moderate amount of serosanguineous  discharge.  The periwound integrity is intact.   IMPRESSION:  Healing right leg stasis ulcer following Apligraf and pressure wrap  dressings.  He has a mild headache, probably from a scalp contusion.  There is no focal neurologic deficits.   PLAN:  After the wrap was removed we will apply Hydrofera blue covered by an  ABD pad and an Unnaboot.  The wrap will be  changed once weekly by  nursing staff.  He should have a physician reevaluation in approximately  4 weeks.  For his headache he was advised to try over-the-counter  medicines such as Goody powder and either call this clinic or go to an  urgent care center should his symptoms worsen.           ______________________________  Barry Dienes Eloise Harman, M.D.     DGP/MEDQ  D:  12/01/2007  T:  12/02/2007  Job:  [id number]

## 2010-07-24 NOTE — Assessment & Plan Note (Signed)
[facility]   NAMETROYCE, GIESKE NO.:  [id number]   MEDICAL RECORD NO.:  [id number]      DATE OF BIRTH:  [birth year]   PHYSICIAN:  Jonelle Sports. Sevier, M.D.  VISIT DATE:  07/01/2007                                   OFFICE VISIT   HISTORY:  This is a 30 year old black male with severe venous  insufficiency in the right lower extremity is seen for a new wound,  which has occurred in an area where he had previous ulceration and was  released from here some 6 weeks ago.   The patient since the resolution previously had been on a compression  stocking 30-40 mmHg with some question as to how religiously he has used  that and also on intermittent venous pumping at that extremity on twice  daily basis again with some question as to his reliability in following  those directions.   Whenever some 10 days ago, he noted the recurrence of an open ulceration  in the same area on the medial aspect of his right calf.  This now  consists of a primary lesion and 2 small satellite ulcers as well.  He  is here now for a repeat treatment.   EXAMINATION:  VITAL SIGNS:  Blood pressure 152/92, pulse 72,  respirations 16, and temperature 98.3.  The 3 wounds on his right lower  extremity now measured conjointly are 2.5 cm x 4.3 cm x essentially 110  cm in depth.  They all have cleaned granular bases.  There is, however,  considerable odor to that extremity.   IMPRESSION:  New venous ulcerations, right lower extremity.   DISPOSITION:  The wounds will be covered with Silver-Line pads because  of the odor, although there is no obvious infection in that extremity  and it will then be returned to an Unna wrap.   The patient is instructed that he may continue his pumping over the Unna  wrap.   He will be seen weekly by the nurse for dressing changes and will be  seen again by a physician in 3 weeks.           ______________________________  Jonelle Sports Cheryll Cockayne,  M.D.     RES/MEDQ  D:  07/01/2007  T:  07/02/2007  Job:  [id number]

## 2010-07-24 NOTE — Assessment & Plan Note (Signed)
[facility]   [name], [name] NO.:  [id number]   MEDICAL RECORD NO.:  [id number]      DATE OF BIRTH:  1980/07/17   PHYSICIAN:  Lenon Curt. Chilton Si, M.D.        VISIT DATE:                                   OFFICE VISIT   HISTORY:  A 30 year old male with chronic venous stasis ulceration,  right lower leg medially returns for a recheck today.  Wound culture was  done last visit and did show moderate growth of staph aureus that is  nsitive to all antibiotics tested.  The patient has had prior infections  in this site, which cause some skin breakdown.  Culture last week was  done because of the appearance of some skin breakdown at the edges of  this wound.  He has not been started on antibiotic yet.  He denies any  pain, fever, chills, increasing erythema, wound odor, or increased  discomfort.   PHYSICAL EXAMINATION:  VITAL SIGNS:  Temperature 98.6, pulse 99,  respirations 16, blood pressure 147/83.  SKIN:  Right medial leg had less than 0.1 x 0.1 x 0.1 cm lesions on the  wound margins in 3 different locations.  There is lot of drainage as has  been present in the past.  No wound odor was noted.  The patient appears  comfortable at the present time.   We will start on Septra DS one twice daily for the next 10 days because  of the presence of the staph aureus.   The patient was advised to bring his contoured compression stocking with  him to the clinic next visit because we anticipate that he will be able  to be discharged.   Treatment today consisted of SilvaSorb applications to the entire wound  bed, hydrogel, and another Radio broadcast assistant.  He will return in 1 week for  recheck.   ICD-9:  454.2, varicose vein with ulcer and inflammation.   CPT:  [id number].      Lenon Curt Chilton Si, M.D.  Electronically Signed     AGG/MEDQ  D:  07/22/2008  T:  07/23/2008  Job:  [id number]

## 2010-07-24 NOTE — Assessment & Plan Note (Signed)
[facility]   NAMEJHALEN, [name] NO.:  [id number]   MEDICAL RECORD NO.:  [id number]      DATE OF BIRTH:  [birth year]   PHYSICIAN:  Theresia Majors. Tanda Rockers, M.D. VISIT DATE:  04/13/2007                                   OFFICE VISIT   SUBJECTIVE:  [name] is a 30 year old man with a stasis ulcer in his  right lower extremity secondary to venous hypertension due to an iliac  stenosis.  The patient returns with a recurrent ulceration.   OBJECTIVE:  His blood pressure is 151/88, respirations 18, pulse rate  90, temperature 98.4.  Inspection of the right lower extremity shows  chronic changes of stasis with a superficial stasis ulcer on the soleus  area.  The pedal pulse was readily palpable, there was associated 2+  edema.   ASSESSMENT:  Recurrent stasis ulcer.   PLAN:  1. We are returning the patient to an Radio broadcast assistant.  2. We will re-evaluate him in 1 week.      [name], M.D.  Electronically Signed     HAN/MEDQ  D:  04/13/2007  T:  04/14/2007  Job:  [id number]

## 2010-07-24 NOTE — Assessment & Plan Note (Signed)
[facility]   NAMEKEENA, HEESCH NO.:  [id number]   MEDICAL RECORD NO.:  [id number]      DATE OF BIRTH:  02-02-1981   PHYSICIAN:  Maxwell Caul, M.D. VISIT DATE:  10/27/2008                                   OFFICE VISIT   Mr. Mchan is a 31 year old man who has chronic venous stasis of his right  greater than left leg.  We have been following him for a wound on the  posterior aspect of the right leg, which has been making good progress.  We have been applying gentamicin topically to the wound then Adaptic and  ABD under an Unna wrap.  He had this changed by nursing last week and he  has a physician check today.   On examination, the area in question has 2 small open areas, although  the rest of this looks as though it is progressively improving with  improvement in the wound dimensions.   I have continued with a topical gentamicin, Adaptic, an ABD under an  Unna boot for this area of venous stasis, which appears to be gradually  improving, although there are 2 recurrent small open areas today.  We  will see this again next week.  I had thought about changing this to a  collagen-based dressing; however, it seems to be making such rapid  improvement that this may not be necessary.           ______________________________  Maxwell Caul, M.D.     MGR/MEDQ  D:  10/27/2008  T:  10/27/2008  Job:  [id number]

## 2010-07-24 NOTE — Assessment & Plan Note (Signed)
[facility]   NAMELOYDE, ORTH NO.:  [id number]   MEDICAL RECORD NO.:  [id number]      DATE OF BIRTH:  Jan 21, 1981   PHYSICIAN:  Theresia Majors. Tanda Rockers, M.D. VISIT DATE:  01/27/2007                                   OFFICE VISIT   Mr. Guice is a 30 year old man whom we are following for a right iliac  stenosis with an iliac vein stenosis.  He has secondary venous  hypertension giving rise to ulcerations.  He has been scheduled to be  seen in consultation with Dr. Audrea Muscat from [facility].  In the  interim, we have treated him with external compression.  There has been  no excessive drainage, malodor, pain or fever.   OBJECTIVE:  Blood pressure is 157/103, respirations 18, pulse rate 81,  temperature is 97.3.  Inspection of the right lower extremity shows that  the wounds have decreased in the area, but there remains a soft exudate.  There is a moderate amount of desquamation, but overall there is  contraction of the wound with advancing epithelium.  A debridement was  not performed.  The pedal pulse remains 3+ bilaterally with persistence  of chronic stasis changes.   ASSESSMENT:  Stasis ulcers secondary to venous hypertension.   PLAN:  We will continue to encourage the patient to keep his appointment  with [facility].  In the interim, we will continue to change dressings on a  p.r.n. basis until the consultation advice and recommendations are  received.      [name], M.D.  Electronically Signed     HAN/MEDQ  D:  01/27/2007  T:  01/27/2007  Job:  [id number]

## 2010-07-24 NOTE — Assessment & Plan Note (Signed)
[facility]   NAMETAIKI, [name] NO.:  [id number]   MEDICAL RECORD NO.:  [id number]      DATE OF BIRTH:  Jan 28, 1981   PHYSICIAN:  Barry Dienes. Eloise Harman, M.D.     VISIT DATE:                                   OFFICE VISIT   SUBJECTIVE:  The patient is a 30 year old African American man with a  chronic right leg venous stasis ulcer.  He is status post application of  Apligraf on October 30, 2007 followed by treatment with Hydrofera blue,  and ABD pad and Unna boots changed once weekly.  He notes that recently  he has had a large amount of fluid drainage from the wound.  He has not  had any recent fever or chills.   OBJECTIVE:  VITAL SIGNS:  Blood pressure 140/90, pulse 93, respirations  20, and temperature 98.7.  The condition of his wounds is as follows:  Wound No. 3:  Located on the medial aspect of the right leg measures  10.0 cm x 4.2 cm x 0.6 cm in depth.  This wound does not have a covering  eschar.  Has moderate necrotic debris with a red and black wound base  color and some red granulation tissue.  There was no exposed bone,  tendon, or muscle.  There was a large amount of serosanguineous  discharge and macerated tissue around the wound.   ASSESSMENT:  Interval worsening of right leg venous stasis ulcer.   PLAN:  After topical anesthesia was obtained via application of EMLA  cream, the necrotic debris within the wound and surrounding the wound  was removed with a #15 scalpel via selective debridement.  As the wound  had progressed on standard treatment, we decided to reapply Apligraf to  the wound in the usual clean fashion.  The usual fashion included  applying normal saline on to the Apligraf tissue culture and tissue  culture medium.  The skin graft was then teased off of the culture  medium and placed on to 4 x 4 gauze.  More saline was then applied and  the graft material was punctured approximately 50 times using a #15  scalpel blade to fenestrate the graft.  The graft was then applied  directly to the wound bed and dissection was cut off to approximate the  wound.  After the Apligraf was applied, it was held in place with Steri-  Strips, then a nonadherent dressing was applied and this was covered  with XtraSorb.  Following this, the right leg was wrapped with an Conservator, museum/gallery.  He was advised to have a nurse visit evaluation in 1 week and a  physician reevaluation visit in 2 weeks.  He should call our clinic if  there are any problems in the interim.           ______________________________  Barry Dienes Eloise Harman, M.D.     DGP/MEDQ  D:  12/22/2007  T:  12/23/2007  Job:  [id number]

## 2010-07-24 NOTE — Assessment & Plan Note (Signed)
[facility]   [name], [name] NO.:  [id number]   MEDICAL RECORD NO.:  [id number]      DATE OF BIRTH:  Dec 11, 1980   PHYSICIAN:  Lenon Curt. Chilton Si, M.D.   VISIT DATE:  05/06/2008                                   OFFICE VISIT   HISTORY:  This 30 year old male with chronic venous stasis disease,  ulcerations, inflammation, and a history of cellulitis of the right  lower leg returns for revisit today.  Wounds are again having somewhat  foul odor to me, although the patient says he does not notice any  change.  He has finished his antibiotics since he was last seen and he  is tolerating his dressings well.  He has not run any fever and he has  not had any increasing pain.   PHYSICAL EXAMINATION:  VITAL SIGNS:  Temperature 98.3, pulse 86,  respirations 20, and blood pressure 150/98.  Wound #3, right medial lower extremity, now measures 3.7 x 5.8 x 0.1-0.2  cm depth.  There is a slight odor to the wound.  There is no significant  erythema.  There is continuing moderate drainage.  Wound appears to be  basically unchanged to me since his last visitation   TREATMENT:  Wound was debrided with forceps to remove slough and  adherence and debris from the edges and from the middle of the wounds.  The patient tolerated the procedure well with no anesthesia.   Following the debridement, recommendations were made to reapply  Silvercel, hydrogel, and an Radio broadcast assistant.  Wound is to be cultured  aerobically again.  Last culture did show MRSA and we will see if this  is still present because of the odor.  No new antibiotics were  prescribed this visit.  The patient is to return for nurse visit 1 week  for dressing change and compression wrap change and in 2 weeks for  physician checkup.      Lenon Curt Chilton Si, M.D.  Electronically Signed     AGG/MEDQ  D:  05/06/2008  T:  05/07/2008  Job:  [id number]

## 2010-07-24 NOTE — Assessment & Plan Note (Signed)
[facility]   [name], [name] NO.:  [id number]   MEDICAL RECORD NO.:  [id number]      DATE OF BIRTH:  11-07-1980   PHYSICIAN:  Theresia Majors. Tanda Rockers, M.D. VISIT DATE:  01/13/2007                                   OFFICE VISIT   SUBJECTIVE:  [name] is a 30 year old man who we are following for  postphlebitic syndrome with ulcerations involving his right lower  extremity.  In the interim, we have treated him with an Unna wrap.  There has been moderate drainage, but there has been no excessive  malodor. The patient continues to be ambulatory and continues to work.   OBJECTIVE:  Blood pressure is 164/98, respirations 18, pulse rate 88,  temperature is 98.5.  Inspection of the right lower extremity shows that  the wounds #1 and #2 are covered with clean healthy-appearing  granulation.  There is evidence of advancing epithelium from the  periphery. There is no excessive drainage or malodor.  No evidence of  infection.  The pedal pulse is palpable.   ASSESSMENT:  Improvement of stasis ulcer.   PLAN:  We will reinstitute the compression wrap therapy.  We have asked  the patient to facilitate our clerical staff in procuring copies of his  operative reports from previous operations in [place] and [place].  We would like to have the patient evaluated by Vascular Surgery from the  standpoint of improving the venous drainage if at all possible.  The  patient indicates that he will and we will initiate the process of  medical release and forwarding those documents to the W[facility] Center.  We  will reevaluate him in 1 week.      [name], M.D.  Electronically Signed     HAN/MEDQ  D:  01/13/2007  T:  01/13/2007  Job:  [id number]

## 2010-07-26 NOTE — Assessment & Plan Note (Signed)
[facility]  NAMEDAELIN, HASTE NO.:  [id number]  MEDICAL RECORD NO.:  [id number]      DATE OF BIRTH:  Jan 30, 1981  PHYSICIAN:  Wayland Denis, DO       VISIT DATE:  07/25/2010                                  OFFICE VISIT   Mr. Roddey is a 29 year old black male who has severe swelling and chronic edema of the right lower extremity.  He has a wound and we have used Prisma and an Radio broadcast assistant last week with some mild improvement seen today. He complained about the foam but otherwise is pleased with the results. He states he tried to keep it elevated and eat healthy with a multivitamin.  Compliance is not clear.  PHYSICAL EXAMINATION:  GENERAL:  He is alert, oriented, cooperative, in no acute distress.  He is pleasant and is hinting around that he would like more pain medicines. LUNGS:  Clear. HEART:  Regular. ABDOMEN:  Soft. EXTREMITIES:  Lower extremity wound, there is some granulation tissue. No sign of infection, otherwise is about the same.  I suggest continuing with the Prima and Unna boot and I have let him know that I do not prescribe chronic pain medicine and that I can refer him to a pain doctor if he would like, he does not at this point in time.     Wayland Denis, DO     CS/MEDQ  D:  07/25/2010  T:  07/26/2010  Job:  [id number]

## 2010-08-15 ENCOUNTER — Encounter (HOSPITAL_BASED_OUTPATIENT_CLINIC_OR_DEPARTMENT_OTHER): Payer: 59 | Attending: Plastic Surgery

## 2010-08-15 DIAGNOSIS — I872 Venous insufficiency (chronic) (peripheral): Secondary | ICD-10-CM | POA: Insufficient documentation

## 2010-08-15 DIAGNOSIS — L97809 Non-pressure chronic ulcer of other part of unspecified lower leg with unspecified severity: Secondary | ICD-10-CM | POA: Insufficient documentation

## 2010-08-30 NOTE — Assessment & Plan Note (Unsigned)
[facility]  NAMEMERVIL, WACKER NO.:  [id number]  MEDICAL RECORD NO.:  [id number]      DATE OF BIRTH:  07-09-1980  PHYSICIAN:  Wayland Denis, DO       VISIT DATE:  08/29/2010                                  OFFICE VISIT   [name] is a 30 year old black man with chronic venous insufficiency and a right lower extremity ulcer.  He has been seeing [place] for several weeks now and being treated with the Unna boot and collagen.  He is improving and the area is healing.  He states he is using the compression machine at some point and does notice some difference.  It is difficult for him to keep elevated as he is active, but we have encouraged him to do that.  There is no change in medications or social history.  GENERAL:  On exam, he is alert and oriented, cooperative, in no acute distress.  He is pleasant for the exam. HEENT:  His pupils are equal and reactive.  His extraocular muscles are intact. LYMPHATICS:  No cervical lymphadenopathy. ABDOMEN:  Large, but no organomegaly palpated and no tenderness. WOUND: The wound size has markedly improved on both account.  He still has the severe swelling and woody thickness to his skin that is not anticipated change.  He should continue with the Unna boot and collagen, elevation, compression, multivitamin, and follow up in a week.     Wayland Denis, DO     CS/MEDQ  D:  08/29/2010  T:  08/30/2010  Job:  [id number]

## 2010-09-06 NOTE — Assessment & Plan Note (Signed)
[facility]  NAMEBRADIN, [name] NO.:  [id number]  MEDICAL RECORD NO.:  [id number]      DATE OF BIRTH:  Jan 24, 1981  PHYSICIAN:  Wayland Denis, DO       VISIT DATE:  09/05/2010                                  OFFICE VISIT   [name] is a 30 year old black male with right lower extremity ulcers from chronic swelling and edema.  He has been using collagen and Unna boot, is actually doing extremely well, and healing and granulating as much lower than it had been.  He is using the compression sometimes, but not regularly, and he is not taking a multivitamin because he is out.  MEDICATIONS:  There is no change in his medications.  REVIEW OF SYSTEMS:  As the same.  PHYSICAL EXAMINATION:  GENERAL:  He is alert, oriented, cooperative, in no acute distress. HEENT:  Pupils are equal.  His extraocular muscles are intact. LUNGS:  His breathing is unlabored. HEART:  Regular. EXTREMITIES:  Lower extremity wound is as described.  We will continue with Unna boot, collagen, elevation, compression, and I have encouraged him for to get the multivitamin and we will see him back in a week for Unna boot change and then in 2 weeks.     Wayland Denis, DO     CS/MEDQ  D:  09/05/2010  T:  09/06/2010  Job:  [id number]

## 2010-09-10 ENCOUNTER — Encounter (HOSPITAL_BASED_OUTPATIENT_CLINIC_OR_DEPARTMENT_OTHER): Payer: 59 | Attending: Internal Medicine

## 2010-09-10 DIAGNOSIS — R609 Edema, unspecified: Secondary | ICD-10-CM | POA: Insufficient documentation

## 2010-09-10 DIAGNOSIS — L97809 Non-pressure chronic ulcer of other part of unspecified lower leg with unspecified severity: Secondary | ICD-10-CM | POA: Insufficient documentation

## 2010-09-21 NOTE — Progress Notes (Signed)
[facility]  NAME:  [name], [name]                      ACCOUNT NO.:  MEDICAL RECORD NO.:  [id number]      DATE OF BIRTH:  22-Dec-1980  PHYSICIAN:  Wayland Denis, DO       VISIT DATE:  08/22/2010                                  OFFICE VISIT   [name] is here for followup on right lower extremity wound.  He has been using Unna boot and collagen with marked improvement.  He is quite pleased with the progress.  The Unna boot has been able to stay on.  He states he has been elevating, and eating better and taking his multivitamins.  Hopefully, this is all occurring.  There is no change in his medications or social history.  On exam, he is alert and oriented, cooperative.  He seems to be a good historian.  He is not in any acute distress.  His pupils are equal and reactive.  His extraocular muscles are intact.  Lungs are clear.  Heart is regular.  Abdomen is soft. Wound is as described in the nurse's note.  Left varicosities, but no sign of overt infection.  We will continue with the collagen and Unna boot, and see him back in a week.     Wayland Denis, DO     CS/MEDQ  D:  08/22/2010  T:  08/23/2010  Job:  [id number]

## 2010-10-02 ENCOUNTER — Other Ambulatory Visit: Payer: Self-pay | Admitting: Family Medicine

## 2010-10-02 ENCOUNTER — Ambulatory Visit
Admission: RE | Admit: 2010-10-02 | Discharge: 2010-10-02 | Disposition: A | Discharge status: Home / Self Care | Payer: 59 | Source: Ambulatory Visit | Attending: Family Medicine | Admitting: Family Medicine

## 2010-10-02 DIAGNOSIS — M79605 Pain in left leg: Secondary | ICD-10-CM

## 2010-10-03 ENCOUNTER — Other Ambulatory Visit: Payer: 59

## 2010-10-16 ENCOUNTER — Encounter (HOSPITAL_BASED_OUTPATIENT_CLINIC_OR_DEPARTMENT_OTHER): Payer: 59 | Attending: Internal Medicine

## 2010-10-30 ENCOUNTER — Encounter (INDEPENDENT_AMBULATORY_CARE_PROVIDER_SITE_OTHER): Payer: 59

## 2010-10-30 DIAGNOSIS — L97909 Non-pressure chronic ulcer of unspecified part of unspecified lower leg with unspecified severity: Secondary | ICD-10-CM

## 2010-11-13 ENCOUNTER — Encounter (HOSPITAL_BASED_OUTPATIENT_CLINIC_OR_DEPARTMENT_OTHER): Payer: 59 | Attending: Internal Medicine

## 2010-11-13 DIAGNOSIS — I872 Venous insufficiency (chronic) (peripheral): Secondary | ICD-10-CM | POA: Insufficient documentation

## 2010-11-13 DIAGNOSIS — L97809 Non-pressure chronic ulcer of other part of unspecified lower leg with unspecified severity: Secondary | ICD-10-CM | POA: Insufficient documentation

## 2010-12-06 LAB — BASIC METABOLIC PANEL
Calcium: 9
Creatinine, Ser: 0.83
GFR calc non Af Amer: >60

## 2010-12-06 LAB — CBC
HCT: 37.4 — ABNORMAL LOW
Hemoglobin: 12.4 — ABNORMAL LOW
MCHC: 33.2
Platelets: 201
RDW: 15.8 — ABNORMAL HIGH
WBC: 8.3

## 2010-12-06 LAB — DIFFERENTIAL
Eosinophils Absolute: 0
Lymphocytes Relative: 8 — ABNORMAL LOW
Lymphs Abs: 0.7
Monocytes Absolute: 0.8
Neutro Abs: 6.8
Neutrophils Relative %: 82 — ABNORMAL HIGH

## 2010-12-10 ENCOUNTER — Encounter (HOSPITAL_BASED_OUTPATIENT_CLINIC_OR_DEPARTMENT_OTHER): Payer: 59 | Attending: Internal Medicine

## 2010-12-10 LAB — CBC
HCT: 30 — ABNORMAL LOW
HCT: 32.6 — ABNORMAL LOW
HCT: 33.6 — ABNORMAL LOW
Hemoglobin: 10.2 — ABNORMAL LOW
Hemoglobin: 10.9 — ABNORMAL LOW
Hemoglobin: 11.2 — ABNORMAL LOW
Hemoglobin: 12.5 — ABNORMAL LOW
MCHC: 32.5
MCHC: 33.3
MCHC: 33.3
MCV: 80.8
MCV: 81.4
Platelets: 260
RBC: 3.76 — ABNORMAL LOW
RBC: 4.04 — ABNORMAL LOW
RBC: 4.12 — ABNORMAL LOW
RBC: 4.71
RDW: 16.2 — ABNORMAL HIGH
WBC: 12.7 — ABNORMAL HIGH
WBC: 13.8 — ABNORMAL HIGH

## 2010-12-10 LAB — DIFFERENTIAL
Basophils Absolute: 0
Basophils Absolute: 0
Basophils Relative: 0
Basophils Relative: 0
Basophils Relative: 0
Eosinophils Absolute: 0
Eosinophils Absolute: 0.1
Eosinophils Relative: 1
Lymphs Abs: 1.1
Monocytes Absolute: 1
Monocytes Absolute: 1.6 — ABNORMAL HIGH
Monocytes Relative: 13 — ABNORMAL HIGH
Monocytes Relative: 9
Neutro Abs: 8.5 — ABNORMAL HIGH
Neutro Abs: 9.6 — ABNORMAL HIGH
Neutrophils Relative %: 76

## 2010-12-10 LAB — BASIC METABOLIC PANEL
BUN: 6
Chloride: 103
Creatinine, Ser: 0.91
GFR calc non Af Amer: >60

## 2010-12-10 LAB — WOUND CULTURE

## 2010-12-10 LAB — COMPREHENSIVE METABOLIC PANEL
CO2: 26
Chloride: 100
Glucose, Bld: 119 — ABNORMAL HIGH
Sodium: 134 — ABNORMAL LOW
Total Protein: 7.3

## 2010-12-10 LAB — PROTIME-INR
INR: 1.2
Prothrombin Time: 15.6 — ABNORMAL HIGH

## 2010-12-10 LAB — CULTURE, BLOOD (ROUTINE X 2): Culture: NO GROWTH

## 2010-12-10 LAB — MAGNESIUM: Magnesium: 2.3

## 2011-01-09 ENCOUNTER — Other Ambulatory Visit: Payer: Self-pay | Admitting: *Deleted

## 2011-01-09 ENCOUNTER — Ambulatory Visit (INDEPENDENT_AMBULATORY_CARE_PROVIDER_SITE_OTHER): Payer: 59 | Admitting: Infectious Diseases

## 2011-01-09 ENCOUNTER — Encounter: Payer: Self-pay | Admitting: Infectious Diseases

## 2011-01-09 DIAGNOSIS — I83009 Varicose veins of unspecified lower extremity with ulcer of unspecified site: Secondary | ICD-10-CM

## 2011-01-09 DIAGNOSIS — L97909 Non-pressure chronic ulcer of unspecified part of unspecified lower leg with unspecified severity: Secondary | ICD-10-CM | POA: Insufficient documentation

## 2011-01-09 DIAGNOSIS — Z113 Encounter for screening for infections with a predominantly sexual mode of transmission: Secondary | ICD-10-CM

## 2011-01-09 DIAGNOSIS — E669 Obesity, unspecified: Secondary | ICD-10-CM

## 2011-01-09 DIAGNOSIS — I10 Essential (primary) hypertension: Secondary | ICD-10-CM

## 2011-01-09 DIAGNOSIS — E66813 Obesity, class 3: Secondary | ICD-10-CM | POA: Insufficient documentation

## 2011-01-09 LAB — CBC WITH DIFFERENTIAL/PLATELET
Basophils Absolute: 0 10*3/uL (ref 0.0–0.1)
Lymphocytes Relative: 20 % (ref 12–46)
Neutro Abs: 5.8 10*3/uL (ref 1.7–7.7)
Platelets: 254 10*3/uL (ref 150–400)
RDW: 15.2 % (ref 11.5–15.5)
WBC: 8.1 10*3/uL (ref 4.0–10.5)

## 2011-01-09 LAB — COMPREHENSIVE METABOLIC PANEL
Albumin: 4 g/dL (ref 3.5–5.2)
Alkaline Phosphatase: 58 U/L (ref 39–117)
BUN: 12 mg/dL (ref 6–23)
Calcium: 9.1 mg/dL (ref 8.4–10.5)
Glucose, Bld: 104 mg/dL — ABNORMAL HIGH (ref 70–99)
Potassium: 4.1 mEq/L (ref 3.5–5.3)

## 2011-01-09 LAB — HEMOGLOBIN A1C: Mean Plasma Glucose: 148 mg/dL — ABNORMAL HIGH (ref <117)

## 2011-01-09 MED ORDER — DOXYCYCLINE HYCLATE 100 MG PO TABS: 100.0000 mg | ORAL_TABLET | Freq: Two times a day (BID) | ORAL | Status: AC

## 2011-01-09 MED ORDER — DOXYCYCLINE HYCLATE 100 MG PO TABS: 100.0000 mg | ORAL_TABLET | Freq: Two times a day (BID) | ORAL | Status: DC

## 2011-01-09 NOTE — Progress Notes (Signed)
  Subjective:    Patient ID: [name], male    DOB: 1980-06-21, 30 y.o.   MRN: [id number]  HPI 30 yo M with obesity, venous insufficiency, vein stripping and RLE venous stasis ulcer. His sore is on his R medial calf. He has had this since early august. He initially had erythema and induration proximal to his wound. This has improved.  Not clear what caused this to start. He had ABI done 10-30-10 that were normal. He has previously had ulcers on his RLE, and has had a "staph" infection there before.  He had a wound Cx (10-23-10) that grew A baumanii (I- ceftriaxone) and MSSA (I-clinda).  Has been on cipro for ~ 1 month. No problems with this, just doesn't like the way it tastes. Wound has been healing well- was previously wet, macerated, with d/c. This has decreased in the intervening period.  Has not noticed proximal erythema.  Had una-boot placed yesterday.No fever or chills. He has felt like his tendon is getting weak in his L foot.  States he has not had his sugar checked in some time.    Review of Systems  Constitutional: Negative for fever and chills.  Respiratory: Negative for chest tightness.   Gastrointestinal: Negative for diarrhea and constipation.  Genitourinary: Negative for dysuria.  Neurological: Negative for headaches.       Objective:   Physical Exam  Constitutional: He appears well-developed and well-nourished.       Morbid obesity  Eyes: EOM are normal. Pupils are equal, round, and reactive to light.  Cardiovascular: Normal rate, regular rhythm and normal heart sounds.   Pulmonary/Chest: Effort normal and breath sounds normal.  Abdominal: Soft. Bowel sounds are normal. There is no tenderness.  Musculoskeletal:       Feet:          Assessment & Plan:

## 2011-01-09 NOTE — Assessment & Plan Note (Addendum)
Will change his antibiotics to doxycycline due to his "tendon" pain as flouroquinolones are well known to cause tendonopathy. He is cautioned about risk of sun burning while on this medication.  We discussed that wound cultures are often difficult to differentiate "true" vs "colonized". Staph tends to be more predictive of true infections. Will have him back in 1 month and hopefully be able to remove dressing then.

## 2011-01-09 NOTE — Assessment & Plan Note (Addendum)
His main isssue is his weight. This will be his Vigo term limiter of his prognosis. WIll check his HgBA1C today. Will try to get him into PCP.

## 2011-01-09 NOTE — Assessment & Plan Note (Signed)
Will refer him back to his PCP for eval.

## 2011-01-15 ENCOUNTER — Encounter (HOSPITAL_BASED_OUTPATIENT_CLINIC_OR_DEPARTMENT_OTHER): Payer: 59 | Attending: General Surgery

## 2011-01-15 DIAGNOSIS — L97909 Non-pressure chronic ulcer of unspecified part of unspecified lower leg with unspecified severity: Secondary | ICD-10-CM | POA: Insufficient documentation

## 2011-01-15 DIAGNOSIS — I83009 Varicose veins of unspecified lower extremity with ulcer of unspecified site: Secondary | ICD-10-CM | POA: Insufficient documentation

## 2011-02-12 ENCOUNTER — Ambulatory Visit: Payer: 59 | Admitting: Infectious Diseases

## 2011-02-12 ENCOUNTER — Encounter (HOSPITAL_BASED_OUTPATIENT_CLINIC_OR_DEPARTMENT_OTHER): Payer: 59 | Attending: General Surgery

## 2011-02-12 DIAGNOSIS — I83009 Varicose veins of unspecified lower extremity with ulcer of unspecified site: Secondary | ICD-10-CM | POA: Insufficient documentation

## 2011-02-12 DIAGNOSIS — L97909 Non-pressure chronic ulcer of unspecified part of unspecified lower leg with unspecified severity: Secondary | ICD-10-CM | POA: Insufficient documentation

## 2011-03-13 ENCOUNTER — Encounter (HOSPITAL_BASED_OUTPATIENT_CLINIC_OR_DEPARTMENT_OTHER): Payer: 59 | Attending: Plastic Surgery

## 2011-03-13 DIAGNOSIS — I83009 Varicose veins of unspecified lower extremity with ulcer of unspecified site: Secondary | ICD-10-CM | POA: Insufficient documentation

## 2011-03-13 DIAGNOSIS — L97909 Non-pressure chronic ulcer of unspecified part of unspecified lower leg with unspecified severity: Secondary | ICD-10-CM | POA: Insufficient documentation

## 2011-04-16 ENCOUNTER — Encounter (HOSPITAL_BASED_OUTPATIENT_CLINIC_OR_DEPARTMENT_OTHER): Payer: 59 | Attending: General Surgery

## 2011-04-16 DIAGNOSIS — I872 Venous insufficiency (chronic) (peripheral): Secondary | ICD-10-CM | POA: Insufficient documentation

## 2011-04-16 DIAGNOSIS — I83009 Varicose veins of unspecified lower extremity with ulcer of unspecified site: Secondary | ICD-10-CM | POA: Insufficient documentation

## 2011-04-16 DIAGNOSIS — L97909 Non-pressure chronic ulcer of unspecified part of unspecified lower leg with unspecified severity: Secondary | ICD-10-CM | POA: Insufficient documentation

## 2011-04-17 ENCOUNTER — Encounter (HOSPITAL_BASED_OUTPATIENT_CLINIC_OR_DEPARTMENT_OTHER): Payer: 59

## 2011-09-18 ENCOUNTER — Encounter (HOSPITAL_BASED_OUTPATIENT_CLINIC_OR_DEPARTMENT_OTHER): Payer: 59 | Attending: General Surgery

## 2011-09-18 DIAGNOSIS — E669 Obesity, unspecified: Secondary | ICD-10-CM | POA: Insufficient documentation

## 2011-09-18 DIAGNOSIS — I872 Venous insufficiency (chronic) (peripheral): Secondary | ICD-10-CM | POA: Insufficient documentation

## 2011-09-18 DIAGNOSIS — I890 Lymphedema, not elsewhere classified: Secondary | ICD-10-CM | POA: Insufficient documentation

## 2011-09-18 DIAGNOSIS — L97809 Non-pressure chronic ulcer of other part of unspecified lower leg with unspecified severity: Secondary | ICD-10-CM | POA: Insufficient documentation

## 2011-09-18 NOTE — Progress Notes (Signed)
[facility]  NAME:  [name], [name] NO.:  [id number]  MEDICAL RECORD NO.:  [id number]      DATE OF BIRTH:  [birth year]  PHYSICIAN:  Ardath Sax, M.D.           VISIT DATE:                                  OFFICE VISIT   This is a 31 year old obese African American male who I have known for some time because of his obesity and bilateral lymphedema and venous stasis ulcers that he develops.  He has been healed up several times in various ways including Oasis, grafts and bilateral Unna boots and many different treatments, but with all of this managed to get him cleared up eventually and then he developed recurrences.  He does have lymphedema pumps at home and he apparently uses them like he is suppose to, but he has a lot of lymphedema at the present time.  We looked at these wounds and I put some silver alginate on it and wrapped him in Unna boots and told him to continue the lymphedema pumps.  He does not take any other medicines and really other than his obesity and lymphedema, he has no other problems but he does weigh 415 pounds, his blood pressure is 152/82, respirations 18, pulse 93, temperature 98.6.  He will come back next week and we will go from there.     Ardath Sax, M.D.     PP/MEDQ  D:  09/18/2011  T:  09/18/2011  Job:  [id number]

## 2011-10-15 ENCOUNTER — Encounter (HOSPITAL_BASED_OUTPATIENT_CLINIC_OR_DEPARTMENT_OTHER): Payer: 59 | Attending: General Surgery

## 2011-10-15 DIAGNOSIS — I83009 Varicose veins of unspecified lower extremity with ulcer of unspecified site: Secondary | ICD-10-CM | POA: Insufficient documentation

## 2011-10-15 DIAGNOSIS — L97909 Non-pressure chronic ulcer of unspecified part of unspecified lower leg with unspecified severity: Secondary | ICD-10-CM | POA: Insufficient documentation

## 2011-10-23 ENCOUNTER — Encounter (HOSPITAL_BASED_OUTPATIENT_CLINIC_OR_DEPARTMENT_OTHER): Payer: 59

## 2011-10-30 ENCOUNTER — Other Ambulatory Visit: Payer: Self-pay | Admitting: Family Medicine

## 2011-10-30 DIAGNOSIS — I871 Compression of vein: Secondary | ICD-10-CM

## 2011-11-05 ENCOUNTER — Ambulatory Visit
Admission: RE | Admit: 2011-11-05 | Discharge: 2011-11-05 | Disposition: A | Discharge status: Home / Self Care | Payer: 59 | Source: Ambulatory Visit | Attending: Family Medicine | Admitting: Family Medicine

## 2011-11-05 DIAGNOSIS — I871 Compression of vein: Secondary | ICD-10-CM

## 2011-11-05 MED ORDER — IOHEXOL 300 MG/ML  SOLN
125.0000 mL | Freq: Once | INTRAMUSCULAR | Status: AC | PRN
  Administered 2011-11-05: 125 mL via INTRAVENOUS

## 2011-11-12 ENCOUNTER — Encounter (HOSPITAL_BASED_OUTPATIENT_CLINIC_OR_DEPARTMENT_OTHER): Payer: 59 | Attending: General Surgery

## 2011-11-12 DIAGNOSIS — L97809 Non-pressure chronic ulcer of other part of unspecified lower leg with unspecified severity: Secondary | ICD-10-CM | POA: Insufficient documentation

## 2011-11-12 DIAGNOSIS — I872 Venous insufficiency (chronic) (peripheral): Secondary | ICD-10-CM | POA: Insufficient documentation

## 2011-12-10 ENCOUNTER — Encounter (HOSPITAL_BASED_OUTPATIENT_CLINIC_OR_DEPARTMENT_OTHER): Payer: 59 | Attending: General Surgery

## 2011-12-10 DIAGNOSIS — I872 Venous insufficiency (chronic) (peripheral): Secondary | ICD-10-CM | POA: Insufficient documentation

## 2011-12-10 DIAGNOSIS — L97809 Non-pressure chronic ulcer of other part of unspecified lower leg with unspecified severity: Secondary | ICD-10-CM | POA: Insufficient documentation

## 2012-01-14 ENCOUNTER — Encounter (HOSPITAL_BASED_OUTPATIENT_CLINIC_OR_DEPARTMENT_OTHER): Payer: 59 | Attending: General Surgery

## 2012-01-14 DIAGNOSIS — I87319 Chronic venous hypertension (idiopathic) with ulcer of unspecified lower extremity: Secondary | ICD-10-CM | POA: Insufficient documentation

## 2012-01-14 DIAGNOSIS — L97909 Non-pressure chronic ulcer of unspecified part of unspecified lower leg with unspecified severity: Secondary | ICD-10-CM | POA: Insufficient documentation

## 2012-02-11 ENCOUNTER — Encounter (HOSPITAL_BASED_OUTPATIENT_CLINIC_OR_DEPARTMENT_OTHER): Payer: 59

## 2012-02-11 ENCOUNTER — Encounter (HOSPITAL_BASED_OUTPATIENT_CLINIC_OR_DEPARTMENT_OTHER): Payer: 59 | Attending: General Surgery

## 2012-02-11 DIAGNOSIS — I890 Lymphedema, not elsewhere classified: Secondary | ICD-10-CM | POA: Insufficient documentation

## 2012-02-11 DIAGNOSIS — I87319 Chronic venous hypertension (idiopathic) with ulcer of unspecified lower extremity: Secondary | ICD-10-CM | POA: Diagnosis present

## 2012-02-11 DIAGNOSIS — L97909 Non-pressure chronic ulcer of unspecified part of unspecified lower leg with unspecified severity: Secondary | ICD-10-CM | POA: Diagnosis present

## 2012-02-18 DIAGNOSIS — L97909 Non-pressure chronic ulcer of unspecified part of unspecified lower leg with unspecified severity: Secondary | ICD-10-CM | POA: Diagnosis not present

## 2012-02-18 DIAGNOSIS — I890 Lymphedema, not elsewhere classified: Secondary | ICD-10-CM | POA: Diagnosis not present

## 2012-02-18 DIAGNOSIS — I87319 Chronic venous hypertension (idiopathic) with ulcer of unspecified lower extremity: Secondary | ICD-10-CM | POA: Diagnosis not present

## 2012-02-25 DIAGNOSIS — L97909 Non-pressure chronic ulcer of unspecified part of unspecified lower leg with unspecified severity: Secondary | ICD-10-CM | POA: Diagnosis not present

## 2012-02-25 DIAGNOSIS — I890 Lymphedema, not elsewhere classified: Secondary | ICD-10-CM | POA: Diagnosis not present

## 2012-02-25 DIAGNOSIS — I87319 Chronic venous hypertension (idiopathic) with ulcer of unspecified lower extremity: Secondary | ICD-10-CM | POA: Diagnosis not present

## 2012-03-02 DIAGNOSIS — L97909 Non-pressure chronic ulcer of unspecified part of unspecified lower leg with unspecified severity: Secondary | ICD-10-CM | POA: Diagnosis not present

## 2012-03-02 DIAGNOSIS — I87319 Chronic venous hypertension (idiopathic) with ulcer of unspecified lower extremity: Secondary | ICD-10-CM | POA: Diagnosis not present

## 2012-03-06 DIAGNOSIS — I890 Lymphedema, not elsewhere classified: Secondary | ICD-10-CM | POA: Diagnosis not present

## 2012-03-06 DIAGNOSIS — I87319 Chronic venous hypertension (idiopathic) with ulcer of unspecified lower extremity: Secondary | ICD-10-CM | POA: Diagnosis not present

## 2012-03-06 DIAGNOSIS — L97909 Non-pressure chronic ulcer of unspecified part of unspecified lower leg with unspecified severity: Secondary | ICD-10-CM | POA: Diagnosis not present

## 2012-03-10 DIAGNOSIS — I87319 Chronic venous hypertension (idiopathic) with ulcer of unspecified lower extremity: Secondary | ICD-10-CM | POA: Diagnosis not present

## 2012-03-10 DIAGNOSIS — L97909 Non-pressure chronic ulcer of unspecified part of unspecified lower leg with unspecified severity: Secondary | ICD-10-CM | POA: Diagnosis not present

## 2012-03-10 DIAGNOSIS — I890 Lymphedema, not elsewhere classified: Secondary | ICD-10-CM | POA: Diagnosis not present

## 2012-03-13 ENCOUNTER — Encounter (HOSPITAL_BASED_OUTPATIENT_CLINIC_OR_DEPARTMENT_OTHER): Payer: 59

## 2012-03-13 ENCOUNTER — Encounter (HOSPITAL_BASED_OUTPATIENT_CLINIC_OR_DEPARTMENT_OTHER): Payer: 59 | Attending: General Surgery

## 2012-03-13 DIAGNOSIS — I872 Venous insufficiency (chronic) (peripheral): Secondary | ICD-10-CM | POA: Insufficient documentation

## 2012-03-13 DIAGNOSIS — L97809 Non-pressure chronic ulcer of other part of unspecified lower leg with unspecified severity: Secondary | ICD-10-CM | POA: Insufficient documentation

## 2012-03-13 DIAGNOSIS — I10 Essential (primary) hypertension: Secondary | ICD-10-CM | POA: Insufficient documentation

## 2012-03-13 DIAGNOSIS — I87319 Chronic venous hypertension (idiopathic) with ulcer of unspecified lower extremity: Secondary | ICD-10-CM | POA: Insufficient documentation

## 2012-03-13 DIAGNOSIS — L97909 Non-pressure chronic ulcer of unspecified part of unspecified lower leg with unspecified severity: Secondary | ICD-10-CM | POA: Insufficient documentation

## 2012-03-16 ENCOUNTER — Encounter (HOSPITAL_BASED_OUTPATIENT_CLINIC_OR_DEPARTMENT_OTHER): Payer: 59

## 2012-03-17 DIAGNOSIS — I872 Venous insufficiency (chronic) (peripheral): Secondary | ICD-10-CM | POA: Diagnosis present

## 2012-03-17 DIAGNOSIS — I87319 Chronic venous hypertension (idiopathic) with ulcer of unspecified lower extremity: Secondary | ICD-10-CM | POA: Diagnosis not present

## 2012-03-17 DIAGNOSIS — L97909 Non-pressure chronic ulcer of unspecified part of unspecified lower leg with unspecified severity: Secondary | ICD-10-CM | POA: Diagnosis not present

## 2012-03-17 DIAGNOSIS — I10 Essential (primary) hypertension: Secondary | ICD-10-CM | POA: Diagnosis not present

## 2012-03-17 DIAGNOSIS — L97809 Non-pressure chronic ulcer of other part of unspecified lower leg with unspecified severity: Secondary | ICD-10-CM | POA: Diagnosis not present

## 2012-03-24 DIAGNOSIS — L97809 Non-pressure chronic ulcer of other part of unspecified lower leg with unspecified severity: Secondary | ICD-10-CM | POA: Diagnosis not present

## 2012-03-24 DIAGNOSIS — I10 Essential (primary) hypertension: Secondary | ICD-10-CM | POA: Diagnosis not present

## 2012-03-24 DIAGNOSIS — I87319 Chronic venous hypertension (idiopathic) with ulcer of unspecified lower extremity: Secondary | ICD-10-CM | POA: Diagnosis not present

## 2012-03-24 DIAGNOSIS — I872 Venous insufficiency (chronic) (peripheral): Secondary | ICD-10-CM | POA: Diagnosis not present

## 2012-03-31 DIAGNOSIS — L97809 Non-pressure chronic ulcer of other part of unspecified lower leg with unspecified severity: Secondary | ICD-10-CM | POA: Diagnosis not present

## 2012-03-31 DIAGNOSIS — I10 Essential (primary) hypertension: Secondary | ICD-10-CM | POA: Diagnosis not present

## 2012-03-31 DIAGNOSIS — I872 Venous insufficiency (chronic) (peripheral): Secondary | ICD-10-CM | POA: Diagnosis present

## 2012-03-31 DIAGNOSIS — I87319 Chronic venous hypertension (idiopathic) with ulcer of unspecified lower extremity: Secondary | ICD-10-CM | POA: Diagnosis not present

## 2012-04-07 DIAGNOSIS — I872 Venous insufficiency (chronic) (peripheral): Secondary | ICD-10-CM | POA: Diagnosis not present

## 2012-04-07 DIAGNOSIS — I87319 Chronic venous hypertension (idiopathic) with ulcer of unspecified lower extremity: Secondary | ICD-10-CM | POA: Diagnosis not present

## 2012-04-07 DIAGNOSIS — L97809 Non-pressure chronic ulcer of other part of unspecified lower leg with unspecified severity: Secondary | ICD-10-CM | POA: Diagnosis not present

## 2012-04-07 DIAGNOSIS — L97909 Non-pressure chronic ulcer of unspecified part of unspecified lower leg with unspecified severity: Secondary | ICD-10-CM | POA: Diagnosis not present

## 2012-04-07 DIAGNOSIS — I10 Essential (primary) hypertension: Secondary | ICD-10-CM | POA: Diagnosis not present

## 2012-04-10 DIAGNOSIS — L97809 Non-pressure chronic ulcer of other part of unspecified lower leg with unspecified severity: Secondary | ICD-10-CM | POA: Diagnosis not present

## 2012-04-10 DIAGNOSIS — I10 Essential (primary) hypertension: Secondary | ICD-10-CM | POA: Diagnosis not present

## 2012-04-10 DIAGNOSIS — I872 Venous insufficiency (chronic) (peripheral): Secondary | ICD-10-CM | POA: Diagnosis present

## 2012-04-10 DIAGNOSIS — I87319 Chronic venous hypertension (idiopathic) with ulcer of unspecified lower extremity: Secondary | ICD-10-CM | POA: Diagnosis not present

## 2012-04-10 DIAGNOSIS — L97909 Non-pressure chronic ulcer of unspecified part of unspecified lower leg with unspecified severity: Secondary | ICD-10-CM | POA: Diagnosis not present

## 2012-04-14 ENCOUNTER — Encounter (HOSPITAL_BASED_OUTPATIENT_CLINIC_OR_DEPARTMENT_OTHER): Payer: BC Managed Care – PPO | Attending: General Surgery

## 2012-04-14 DIAGNOSIS — I872 Venous insufficiency (chronic) (peripheral): Secondary | ICD-10-CM | POA: Insufficient documentation

## 2012-04-14 DIAGNOSIS — I87319 Chronic venous hypertension (idiopathic) with ulcer of unspecified lower extremity: Secondary | ICD-10-CM | POA: Insufficient documentation

## 2012-04-14 DIAGNOSIS — L97909 Non-pressure chronic ulcer of unspecified part of unspecified lower leg with unspecified severity: Secondary | ICD-10-CM | POA: Insufficient documentation

## 2012-04-17 ENCOUNTER — Encounter (HOSPITAL_BASED_OUTPATIENT_CLINIC_OR_DEPARTMENT_OTHER): Payer: 59

## 2012-05-12 ENCOUNTER — Encounter (HOSPITAL_BASED_OUTPATIENT_CLINIC_OR_DEPARTMENT_OTHER): Payer: BC Managed Care – PPO | Attending: General Surgery

## 2012-05-12 DIAGNOSIS — I87319 Chronic venous hypertension (idiopathic) with ulcer of unspecified lower extremity: Secondary | ICD-10-CM | POA: Insufficient documentation

## 2012-05-12 DIAGNOSIS — L97909 Non-pressure chronic ulcer of unspecified part of unspecified lower leg with unspecified severity: Secondary | ICD-10-CM | POA: Insufficient documentation

## 2012-06-09 ENCOUNTER — Encounter (HOSPITAL_BASED_OUTPATIENT_CLINIC_OR_DEPARTMENT_OTHER): Payer: 59 | Attending: General Surgery

## 2012-06-09 DIAGNOSIS — L97909 Non-pressure chronic ulcer of unspecified part of unspecified lower leg with unspecified severity: Secondary | ICD-10-CM | POA: Insufficient documentation

## 2012-06-09 DIAGNOSIS — I87319 Chronic venous hypertension (idiopathic) with ulcer of unspecified lower extremity: Secondary | ICD-10-CM | POA: Insufficient documentation

## 2012-07-14 ENCOUNTER — Encounter (HOSPITAL_BASED_OUTPATIENT_CLINIC_OR_DEPARTMENT_OTHER): Payer: 59 | Attending: General Surgery

## 2012-07-14 DIAGNOSIS — L97909 Non-pressure chronic ulcer of unspecified part of unspecified lower leg with unspecified severity: Secondary | ICD-10-CM | POA: Insufficient documentation

## 2012-07-14 DIAGNOSIS — I87319 Chronic venous hypertension (idiopathic) with ulcer of unspecified lower extremity: Secondary | ICD-10-CM | POA: Insufficient documentation

## 2012-08-11 ENCOUNTER — Encounter (HOSPITAL_BASED_OUTPATIENT_CLINIC_OR_DEPARTMENT_OTHER): Payer: 59 | Attending: General Surgery

## 2012-08-11 DIAGNOSIS — L89899 Pressure ulcer of other site, unspecified stage: Secondary | ICD-10-CM | POA: Insufficient documentation

## 2012-08-11 DIAGNOSIS — L89609 Pressure ulcer of unspecified heel, unspecified stage: Secondary | ICD-10-CM | POA: Insufficient documentation

## 2012-08-11 DIAGNOSIS — L8993 Pressure ulcer of unspecified site, stage 3: Secondary | ICD-10-CM | POA: Insufficient documentation

## 2012-08-31 DIAGNOSIS — I83009 Varicose veins of unspecified lower extremity with ulcer of unspecified site: Secondary | ICD-10-CM | POA: Insufficient documentation

## 2012-09-08 ENCOUNTER — Encounter (HOSPITAL_BASED_OUTPATIENT_CLINIC_OR_DEPARTMENT_OTHER): Payer: BC Managed Care – PPO | Attending: General Surgery

## 2012-09-08 DIAGNOSIS — I890 Lymphedema, not elsewhere classified: Secondary | ICD-10-CM | POA: Insufficient documentation

## 2012-09-08 DIAGNOSIS — I872 Venous insufficiency (chronic) (peripheral): Secondary | ICD-10-CM | POA: Insufficient documentation

## 2012-09-08 DIAGNOSIS — L97809 Non-pressure chronic ulcer of other part of unspecified lower leg with unspecified severity: Secondary | ICD-10-CM | POA: Insufficient documentation

## 2012-10-13 ENCOUNTER — Encounter (HOSPITAL_BASED_OUTPATIENT_CLINIC_OR_DEPARTMENT_OTHER): Payer: BC Managed Care – PPO | Attending: General Surgery

## 2012-10-13 DIAGNOSIS — I87319 Chronic venous hypertension (idiopathic) with ulcer of unspecified lower extremity: Secondary | ICD-10-CM | POA: Insufficient documentation

## 2012-10-13 DIAGNOSIS — L97909 Non-pressure chronic ulcer of unspecified part of unspecified lower leg with unspecified severity: Secondary | ICD-10-CM | POA: Insufficient documentation

## 2012-11-10 ENCOUNTER — Encounter (HOSPITAL_BASED_OUTPATIENT_CLINIC_OR_DEPARTMENT_OTHER): Payer: BC Managed Care – PPO | Attending: General Surgery

## 2012-11-10 DIAGNOSIS — I87319 Chronic venous hypertension (idiopathic) with ulcer of unspecified lower extremity: Secondary | ICD-10-CM | POA: Insufficient documentation

## 2012-11-10 DIAGNOSIS — L97909 Non-pressure chronic ulcer of unspecified part of unspecified lower leg with unspecified severity: Secondary | ICD-10-CM | POA: Insufficient documentation

## 2012-12-15 ENCOUNTER — Encounter (HOSPITAL_BASED_OUTPATIENT_CLINIC_OR_DEPARTMENT_OTHER): Payer: BC Managed Care – PPO | Attending: General Surgery

## 2012-12-15 DIAGNOSIS — L97209 Non-pressure chronic ulcer of unspecified calf with unspecified severity: Secondary | ICD-10-CM | POA: Insufficient documentation

## 2012-12-15 DIAGNOSIS — I872 Venous insufficiency (chronic) (peripheral): Secondary | ICD-10-CM | POA: Insufficient documentation

## 2013-01-12 ENCOUNTER — Encounter (HOSPITAL_BASED_OUTPATIENT_CLINIC_OR_DEPARTMENT_OTHER): Payer: BC Managed Care – PPO | Attending: General Surgery

## 2013-01-12 DIAGNOSIS — L97909 Non-pressure chronic ulcer of unspecified part of unspecified lower leg with unspecified severity: Secondary | ICD-10-CM | POA: Insufficient documentation

## 2013-01-12 DIAGNOSIS — I87319 Chronic venous hypertension (idiopathic) with ulcer of unspecified lower extremity: Secondary | ICD-10-CM | POA: Insufficient documentation

## 2013-02-09 ENCOUNTER — Encounter (HOSPITAL_BASED_OUTPATIENT_CLINIC_OR_DEPARTMENT_OTHER): Payer: BC Managed Care – PPO | Attending: General Surgery

## 2013-02-09 DIAGNOSIS — I87319 Chronic venous hypertension (idiopathic) with ulcer of unspecified lower extremity: Secondary | ICD-10-CM | POA: Insufficient documentation

## 2013-02-09 DIAGNOSIS — L97909 Non-pressure chronic ulcer of unspecified part of unspecified lower leg with unspecified severity: Secondary | ICD-10-CM | POA: Insufficient documentation

## 2013-03-16 ENCOUNTER — Encounter (HOSPITAL_BASED_OUTPATIENT_CLINIC_OR_DEPARTMENT_OTHER): Payer: BC Managed Care – PPO | Attending: General Surgery

## 2013-03-16 DIAGNOSIS — I87339 Chronic venous hypertension (idiopathic) with ulcer and inflammation of unspecified lower extremity: Secondary | ICD-10-CM | POA: Insufficient documentation

## 2013-03-16 DIAGNOSIS — L97909 Non-pressure chronic ulcer of unspecified part of unspecified lower leg with unspecified severity: Principal | ICD-10-CM

## 2013-04-13 ENCOUNTER — Encounter (HOSPITAL_BASED_OUTPATIENT_CLINIC_OR_DEPARTMENT_OTHER): Payer: BC Managed Care – PPO | Attending: General Surgery

## 2013-04-13 DIAGNOSIS — I87339 Chronic venous hypertension (idiopathic) with ulcer and inflammation of unspecified lower extremity: Secondary | ICD-10-CM | POA: Insufficient documentation

## 2013-04-13 DIAGNOSIS — L97909 Non-pressure chronic ulcer of unspecified part of unspecified lower leg with unspecified severity: Principal | ICD-10-CM

## 2013-05-11 ENCOUNTER — Encounter (HOSPITAL_BASED_OUTPATIENT_CLINIC_OR_DEPARTMENT_OTHER): Payer: BC Managed Care – PPO | Attending: General Surgery

## 2013-05-11 DIAGNOSIS — I87339 Chronic venous hypertension (idiopathic) with ulcer and inflammation of unspecified lower extremity: Secondary | ICD-10-CM | POA: Insufficient documentation

## 2013-05-11 DIAGNOSIS — L97909 Non-pressure chronic ulcer of unspecified part of unspecified lower leg with unspecified severity: Principal | ICD-10-CM

## 2013-06-03 ENCOUNTER — Other Ambulatory Visit: Payer: Self-pay | Admitting: Physician Assistant

## 2013-06-03 ENCOUNTER — Ambulatory Visit
Admission: RE | Admit: 2013-06-03 | Discharge: 2013-06-03 | Disposition: A | Discharge status: Home / Self Care | Payer: BC Managed Care – PPO | Source: Ambulatory Visit | Attending: Physician Assistant | Admitting: Physician Assistant

## 2013-06-15 ENCOUNTER — Encounter (HOSPITAL_BASED_OUTPATIENT_CLINIC_OR_DEPARTMENT_OTHER): Payer: BC Managed Care – PPO | Attending: General Surgery

## 2013-06-15 DIAGNOSIS — L97809 Non-pressure chronic ulcer of other part of unspecified lower leg with unspecified severity: Secondary | ICD-10-CM | POA: Insufficient documentation

## 2013-06-15 DIAGNOSIS — L97909 Non-pressure chronic ulcer of unspecified part of unspecified lower leg with unspecified severity: Principal | ICD-10-CM

## 2013-06-15 DIAGNOSIS — I87339 Chronic venous hypertension (idiopathic) with ulcer and inflammation of unspecified lower extremity: Secondary | ICD-10-CM | POA: Insufficient documentation

## 2013-07-13 ENCOUNTER — Encounter (HOSPITAL_BASED_OUTPATIENT_CLINIC_OR_DEPARTMENT_OTHER): Payer: BC Managed Care – PPO | Attending: General Surgery

## 2013-07-13 DIAGNOSIS — I87339 Chronic venous hypertension (idiopathic) with ulcer and inflammation of unspecified lower extremity: Secondary | ICD-10-CM | POA: Insufficient documentation

## 2013-07-13 DIAGNOSIS — L97909 Non-pressure chronic ulcer of unspecified part of unspecified lower leg with unspecified severity: Principal | ICD-10-CM

## 2013-07-13 DIAGNOSIS — L97809 Non-pressure chronic ulcer of other part of unspecified lower leg with unspecified severity: Secondary | ICD-10-CM | POA: Insufficient documentation

## 2013-08-10 ENCOUNTER — Encounter (HOSPITAL_BASED_OUTPATIENT_CLINIC_OR_DEPARTMENT_OTHER): Payer: BC Managed Care – PPO | Attending: General Surgery

## 2013-08-10 DIAGNOSIS — L97809 Non-pressure chronic ulcer of other part of unspecified lower leg with unspecified severity: Secondary | ICD-10-CM | POA: Insufficient documentation

## 2013-08-10 DIAGNOSIS — L97909 Non-pressure chronic ulcer of unspecified part of unspecified lower leg with unspecified severity: Principal | ICD-10-CM

## 2013-08-10 DIAGNOSIS — I87339 Chronic venous hypertension (idiopathic) with ulcer and inflammation of unspecified lower extremity: Secondary | ICD-10-CM | POA: Insufficient documentation

## 2013-09-14 ENCOUNTER — Encounter (HOSPITAL_BASED_OUTPATIENT_CLINIC_OR_DEPARTMENT_OTHER): Payer: BC Managed Care – PPO | Attending: General Surgery

## 2013-09-14 DIAGNOSIS — L97809 Non-pressure chronic ulcer of other part of unspecified lower leg with unspecified severity: Secondary | ICD-10-CM | POA: Insufficient documentation

## 2013-09-14 DIAGNOSIS — L97909 Non-pressure chronic ulcer of unspecified part of unspecified lower leg with unspecified severity: Principal | ICD-10-CM

## 2013-09-14 DIAGNOSIS — I87339 Chronic venous hypertension (idiopathic) with ulcer and inflammation of unspecified lower extremity: Secondary | ICD-10-CM | POA: Insufficient documentation

## 2013-10-12 ENCOUNTER — Encounter (HOSPITAL_BASED_OUTPATIENT_CLINIC_OR_DEPARTMENT_OTHER): Payer: BC Managed Care – PPO | Attending: General Surgery

## 2013-10-12 DIAGNOSIS — I87339 Chronic venous hypertension (idiopathic) with ulcer and inflammation of unspecified lower extremity: Secondary | ICD-10-CM | POA: Insufficient documentation

## 2013-10-12 DIAGNOSIS — L97909 Non-pressure chronic ulcer of unspecified part of unspecified lower leg with unspecified severity: Secondary | ICD-10-CM | POA: Diagnosis not present

## 2013-10-12 DIAGNOSIS — L97809 Non-pressure chronic ulcer of other part of unspecified lower leg with unspecified severity: Secondary | ICD-10-CM | POA: Diagnosis not present

## 2013-10-19 DIAGNOSIS — L97909 Non-pressure chronic ulcer of unspecified part of unspecified lower leg with unspecified severity: Secondary | ICD-10-CM | POA: Diagnosis not present

## 2013-10-19 DIAGNOSIS — I87339 Chronic venous hypertension (idiopathic) with ulcer and inflammation of unspecified lower extremity: Secondary | ICD-10-CM | POA: Diagnosis not present

## 2013-10-19 DIAGNOSIS — L97809 Non-pressure chronic ulcer of other part of unspecified lower leg with unspecified severity: Secondary | ICD-10-CM | POA: Diagnosis not present

## 2013-10-26 DIAGNOSIS — L97809 Non-pressure chronic ulcer of other part of unspecified lower leg with unspecified severity: Secondary | ICD-10-CM | POA: Diagnosis not present

## 2013-10-26 DIAGNOSIS — I87339 Chronic venous hypertension (idiopathic) with ulcer and inflammation of unspecified lower extremity: Secondary | ICD-10-CM | POA: Diagnosis not present

## 2013-11-02 DIAGNOSIS — L97909 Non-pressure chronic ulcer of unspecified part of unspecified lower leg with unspecified severity: Secondary | ICD-10-CM | POA: Diagnosis not present

## 2013-11-02 DIAGNOSIS — I87339 Chronic venous hypertension (idiopathic) with ulcer and inflammation of unspecified lower extremity: Secondary | ICD-10-CM | POA: Diagnosis not present

## 2013-11-09 ENCOUNTER — Encounter (HOSPITAL_BASED_OUTPATIENT_CLINIC_OR_DEPARTMENT_OTHER): Payer: BC Managed Care – PPO | Attending: General Surgery

## 2013-11-09 DIAGNOSIS — L97909 Non-pressure chronic ulcer of unspecified part of unspecified lower leg with unspecified severity: Secondary | ICD-10-CM | POA: Insufficient documentation

## 2013-11-09 DIAGNOSIS — I87339 Chronic venous hypertension (idiopathic) with ulcer and inflammation of unspecified lower extremity: Secondary | ICD-10-CM | POA: Diagnosis present

## 2013-11-12 DIAGNOSIS — I87339 Chronic venous hypertension (idiopathic) with ulcer and inflammation of unspecified lower extremity: Secondary | ICD-10-CM | POA: Diagnosis not present

## 2013-11-16 DIAGNOSIS — L97909 Non-pressure chronic ulcer of unspecified part of unspecified lower leg with unspecified severity: Secondary | ICD-10-CM | POA: Diagnosis not present

## 2013-11-16 DIAGNOSIS — I87339 Chronic venous hypertension (idiopathic) with ulcer and inflammation of unspecified lower extremity: Secondary | ICD-10-CM | POA: Diagnosis not present

## 2013-11-19 DIAGNOSIS — I87339 Chronic venous hypertension (idiopathic) with ulcer and inflammation of unspecified lower extremity: Secondary | ICD-10-CM | POA: Diagnosis not present

## 2013-11-23 DIAGNOSIS — L97909 Non-pressure chronic ulcer of unspecified part of unspecified lower leg with unspecified severity: Secondary | ICD-10-CM | POA: Diagnosis not present

## 2013-11-23 DIAGNOSIS — I87339 Chronic venous hypertension (idiopathic) with ulcer and inflammation of unspecified lower extremity: Secondary | ICD-10-CM | POA: Diagnosis not present

## 2013-11-26 DIAGNOSIS — I87339 Chronic venous hypertension (idiopathic) with ulcer and inflammation of unspecified lower extremity: Secondary | ICD-10-CM | POA: Diagnosis not present

## 2013-11-30 DIAGNOSIS — L97909 Non-pressure chronic ulcer of unspecified part of unspecified lower leg with unspecified severity: Secondary | ICD-10-CM | POA: Diagnosis not present

## 2013-11-30 DIAGNOSIS — I87339 Chronic venous hypertension (idiopathic) with ulcer and inflammation of unspecified lower extremity: Secondary | ICD-10-CM | POA: Diagnosis not present

## 2013-12-03 DIAGNOSIS — I87339 Chronic venous hypertension (idiopathic) with ulcer and inflammation of unspecified lower extremity: Secondary | ICD-10-CM | POA: Diagnosis not present

## 2013-12-07 DIAGNOSIS — I87339 Chronic venous hypertension (idiopathic) with ulcer and inflammation of unspecified lower extremity: Secondary | ICD-10-CM | POA: Diagnosis not present

## 2013-12-07 DIAGNOSIS — L97909 Non-pressure chronic ulcer of unspecified part of unspecified lower leg with unspecified severity: Secondary | ICD-10-CM | POA: Diagnosis not present

## 2013-12-10 ENCOUNTER — Encounter (HOSPITAL_BASED_OUTPATIENT_CLINIC_OR_DEPARTMENT_OTHER): Payer: BC Managed Care – PPO | Attending: General Surgery

## 2013-12-10 DIAGNOSIS — L97819 Non-pressure chronic ulcer of other part of right lower leg with unspecified severity: Secondary | ICD-10-CM | POA: Diagnosis not present

## 2013-12-10 DIAGNOSIS — I87311 Chronic venous hypertension (idiopathic) with ulcer of right lower extremity: Secondary | ICD-10-CM | POA: Insufficient documentation

## 2013-12-10 DIAGNOSIS — I890 Lymphedema, not elsewhere classified: Secondary | ICD-10-CM | POA: Diagnosis not present

## 2013-12-14 DIAGNOSIS — I87311 Chronic venous hypertension (idiopathic) with ulcer of right lower extremity: Secondary | ICD-10-CM | POA: Diagnosis not present

## 2013-12-14 DIAGNOSIS — I890 Lymphedema, not elsewhere classified: Secondary | ICD-10-CM | POA: Diagnosis not present

## 2013-12-14 DIAGNOSIS — L97819 Non-pressure chronic ulcer of other part of right lower leg with unspecified severity: Secondary | ICD-10-CM | POA: Diagnosis not present

## 2013-12-21 DIAGNOSIS — I890 Lymphedema, not elsewhere classified: Secondary | ICD-10-CM | POA: Diagnosis not present

## 2013-12-21 DIAGNOSIS — L97819 Non-pressure chronic ulcer of other part of right lower leg with unspecified severity: Secondary | ICD-10-CM | POA: Diagnosis not present

## 2013-12-21 DIAGNOSIS — I87311 Chronic venous hypertension (idiopathic) with ulcer of right lower extremity: Secondary | ICD-10-CM | POA: Diagnosis not present

## 2013-12-28 DIAGNOSIS — I890 Lymphedema, not elsewhere classified: Secondary | ICD-10-CM | POA: Diagnosis not present

## 2013-12-28 DIAGNOSIS — L97819 Non-pressure chronic ulcer of other part of right lower leg with unspecified severity: Secondary | ICD-10-CM | POA: Diagnosis not present

## 2013-12-28 DIAGNOSIS — I87311 Chronic venous hypertension (idiopathic) with ulcer of right lower extremity: Secondary | ICD-10-CM | POA: Diagnosis not present

## 2014-01-04 DIAGNOSIS — L97819 Non-pressure chronic ulcer of other part of right lower leg with unspecified severity: Secondary | ICD-10-CM | POA: Diagnosis not present

## 2014-01-04 DIAGNOSIS — I87311 Chronic venous hypertension (idiopathic) with ulcer of right lower extremity: Secondary | ICD-10-CM | POA: Diagnosis not present

## 2014-01-04 DIAGNOSIS — I890 Lymphedema, not elsewhere classified: Secondary | ICD-10-CM | POA: Diagnosis not present

## 2014-01-07 DIAGNOSIS — I87311 Chronic venous hypertension (idiopathic) with ulcer of right lower extremity: Secondary | ICD-10-CM | POA: Diagnosis not present

## 2014-01-11 ENCOUNTER — Encounter (HOSPITAL_BASED_OUTPATIENT_CLINIC_OR_DEPARTMENT_OTHER): Payer: BC Managed Care – PPO | Attending: General Surgery

## 2014-01-11 DIAGNOSIS — I87331 Chronic venous hypertension (idiopathic) with ulcer and inflammation of right lower extremity: Secondary | ICD-10-CM | POA: Insufficient documentation

## 2014-01-11 DIAGNOSIS — L97919 Non-pressure chronic ulcer of unspecified part of right lower leg with unspecified severity: Secondary | ICD-10-CM | POA: Insufficient documentation

## 2014-01-14 DIAGNOSIS — I87331 Chronic venous hypertension (idiopathic) with ulcer and inflammation of right lower extremity: Secondary | ICD-10-CM | POA: Diagnosis not present

## 2014-01-18 DIAGNOSIS — I87331 Chronic venous hypertension (idiopathic) with ulcer and inflammation of right lower extremity: Secondary | ICD-10-CM | POA: Diagnosis not present

## 2014-01-18 DIAGNOSIS — L97919 Non-pressure chronic ulcer of unspecified part of right lower leg with unspecified severity: Secondary | ICD-10-CM | POA: Diagnosis not present

## 2014-01-21 DIAGNOSIS — I87331 Chronic venous hypertension (idiopathic) with ulcer and inflammation of right lower extremity: Secondary | ICD-10-CM | POA: Diagnosis not present

## 2014-01-25 DIAGNOSIS — L97919 Non-pressure chronic ulcer of unspecified part of right lower leg with unspecified severity: Secondary | ICD-10-CM | POA: Diagnosis not present

## 2014-01-25 DIAGNOSIS — I87331 Chronic venous hypertension (idiopathic) with ulcer and inflammation of right lower extremity: Secondary | ICD-10-CM | POA: Diagnosis not present

## 2014-01-28 DIAGNOSIS — I87331 Chronic venous hypertension (idiopathic) with ulcer and inflammation of right lower extremity: Secondary | ICD-10-CM | POA: Diagnosis not present

## 2014-02-08 ENCOUNTER — Encounter (HOSPITAL_BASED_OUTPATIENT_CLINIC_OR_DEPARTMENT_OTHER): Payer: BC Managed Care – PPO | Attending: General Surgery

## 2014-02-08 DIAGNOSIS — L97919 Non-pressure chronic ulcer of unspecified part of right lower leg with unspecified severity: Secondary | ICD-10-CM | POA: Diagnosis not present

## 2014-02-08 DIAGNOSIS — I87331 Chronic venous hypertension (idiopathic) with ulcer and inflammation of right lower extremity: Secondary | ICD-10-CM | POA: Diagnosis not present

## 2014-02-11 DIAGNOSIS — I87331 Chronic venous hypertension (idiopathic) with ulcer and inflammation of right lower extremity: Secondary | ICD-10-CM | POA: Diagnosis not present

## 2014-02-15 DIAGNOSIS — I87331 Chronic venous hypertension (idiopathic) with ulcer and inflammation of right lower extremity: Secondary | ICD-10-CM | POA: Diagnosis not present

## 2014-02-15 DIAGNOSIS — L97919 Non-pressure chronic ulcer of unspecified part of right lower leg with unspecified severity: Secondary | ICD-10-CM | POA: Diagnosis not present

## 2014-02-18 DIAGNOSIS — I87331 Chronic venous hypertension (idiopathic) with ulcer and inflammation of right lower extremity: Secondary | ICD-10-CM | POA: Diagnosis not present

## 2014-02-22 DIAGNOSIS — I87331 Chronic venous hypertension (idiopathic) with ulcer and inflammation of right lower extremity: Secondary | ICD-10-CM | POA: Diagnosis not present

## 2014-02-22 DIAGNOSIS — L97919 Non-pressure chronic ulcer of unspecified part of right lower leg with unspecified severity: Secondary | ICD-10-CM | POA: Diagnosis not present

## 2014-02-25 DIAGNOSIS — I87331 Chronic venous hypertension (idiopathic) with ulcer and inflammation of right lower extremity: Secondary | ICD-10-CM | POA: Diagnosis not present

## 2014-03-01 DIAGNOSIS — I87331 Chronic venous hypertension (idiopathic) with ulcer and inflammation of right lower extremity: Secondary | ICD-10-CM | POA: Diagnosis not present

## 2014-03-01 DIAGNOSIS — L97919 Non-pressure chronic ulcer of unspecified part of right lower leg with unspecified severity: Secondary | ICD-10-CM | POA: Diagnosis not present

## 2014-03-08 DIAGNOSIS — I87331 Chronic venous hypertension (idiopathic) with ulcer and inflammation of right lower extremity: Secondary | ICD-10-CM | POA: Diagnosis not present

## 2014-03-08 DIAGNOSIS — L97919 Non-pressure chronic ulcer of unspecified part of right lower leg with unspecified severity: Secondary | ICD-10-CM | POA: Diagnosis not present

## 2014-03-15 ENCOUNTER — Encounter (HOSPITAL_BASED_OUTPATIENT_CLINIC_OR_DEPARTMENT_OTHER): Payer: BLUE CROSS/BLUE SHIELD | Attending: General Surgery

## 2014-03-15 DIAGNOSIS — I87331 Chronic venous hypertension (idiopathic) with ulcer and inflammation of right lower extremity: Secondary | ICD-10-CM | POA: Diagnosis not present

## 2014-03-15 DIAGNOSIS — L97911 Non-pressure chronic ulcer of unspecified part of right lower leg limited to breakdown of skin: Secondary | ICD-10-CM | POA: Insufficient documentation

## 2014-03-18 DIAGNOSIS — I87331 Chronic venous hypertension (idiopathic) with ulcer and inflammation of right lower extremity: Secondary | ICD-10-CM | POA: Diagnosis not present

## 2014-03-22 DIAGNOSIS — I87331 Chronic venous hypertension (idiopathic) with ulcer and inflammation of right lower extremity: Secondary | ICD-10-CM | POA: Diagnosis not present

## 2014-03-22 DIAGNOSIS — L97911 Non-pressure chronic ulcer of unspecified part of right lower leg limited to breakdown of skin: Secondary | ICD-10-CM | POA: Diagnosis not present

## 2014-03-25 DIAGNOSIS — I87331 Chronic venous hypertension (idiopathic) with ulcer and inflammation of right lower extremity: Secondary | ICD-10-CM | POA: Diagnosis not present

## 2014-03-29 DIAGNOSIS — I87331 Chronic venous hypertension (idiopathic) with ulcer and inflammation of right lower extremity: Secondary | ICD-10-CM | POA: Diagnosis not present

## 2014-03-29 DIAGNOSIS — L97911 Non-pressure chronic ulcer of unspecified part of right lower leg limited to breakdown of skin: Secondary | ICD-10-CM | POA: Diagnosis not present

## 2014-04-05 DIAGNOSIS — I87331 Chronic venous hypertension (idiopathic) with ulcer and inflammation of right lower extremity: Secondary | ICD-10-CM | POA: Diagnosis not present

## 2014-04-05 DIAGNOSIS — L97911 Non-pressure chronic ulcer of unspecified part of right lower leg limited to breakdown of skin: Secondary | ICD-10-CM | POA: Diagnosis not present

## 2014-04-08 DIAGNOSIS — I87331 Chronic venous hypertension (idiopathic) with ulcer and inflammation of right lower extremity: Secondary | ICD-10-CM | POA: Diagnosis not present

## 2014-04-12 ENCOUNTER — Encounter (HOSPITAL_BASED_OUTPATIENT_CLINIC_OR_DEPARTMENT_OTHER): Payer: BLUE CROSS/BLUE SHIELD | Attending: General Surgery

## 2014-04-12 DIAGNOSIS — I87331 Chronic venous hypertension (idiopathic) with ulcer and inflammation of right lower extremity: Secondary | ICD-10-CM | POA: Insufficient documentation

## 2014-04-12 DIAGNOSIS — L97819 Non-pressure chronic ulcer of other part of right lower leg with unspecified severity: Secondary | ICD-10-CM | POA: Insufficient documentation

## 2014-04-15 DIAGNOSIS — I87331 Chronic venous hypertension (idiopathic) with ulcer and inflammation of right lower extremity: Secondary | ICD-10-CM | POA: Diagnosis not present

## 2014-04-19 DIAGNOSIS — L97819 Non-pressure chronic ulcer of other part of right lower leg with unspecified severity: Secondary | ICD-10-CM | POA: Diagnosis not present

## 2014-04-19 DIAGNOSIS — I87331 Chronic venous hypertension (idiopathic) with ulcer and inflammation of right lower extremity: Secondary | ICD-10-CM | POA: Diagnosis not present

## 2014-04-22 DIAGNOSIS — I87331 Chronic venous hypertension (idiopathic) with ulcer and inflammation of right lower extremity: Secondary | ICD-10-CM | POA: Diagnosis not present

## 2014-04-26 DIAGNOSIS — L97819 Non-pressure chronic ulcer of other part of right lower leg with unspecified severity: Secondary | ICD-10-CM | POA: Diagnosis not present

## 2014-04-26 DIAGNOSIS — I87331 Chronic venous hypertension (idiopathic) with ulcer and inflammation of right lower extremity: Secondary | ICD-10-CM | POA: Diagnosis not present

## 2014-04-29 DIAGNOSIS — I87331 Chronic venous hypertension (idiopathic) with ulcer and inflammation of right lower extremity: Secondary | ICD-10-CM | POA: Diagnosis not present

## 2014-05-03 DIAGNOSIS — I87331 Chronic venous hypertension (idiopathic) with ulcer and inflammation of right lower extremity: Secondary | ICD-10-CM | POA: Diagnosis not present

## 2014-05-03 DIAGNOSIS — L97819 Non-pressure chronic ulcer of other part of right lower leg with unspecified severity: Secondary | ICD-10-CM | POA: Diagnosis not present

## 2014-05-10 ENCOUNTER — Encounter (HOSPITAL_BASED_OUTPATIENT_CLINIC_OR_DEPARTMENT_OTHER): Payer: BLUE CROSS/BLUE SHIELD | Attending: General Surgery

## 2014-05-10 DIAGNOSIS — L97811 Non-pressure chronic ulcer of other part of right lower leg limited to breakdown of skin: Secondary | ICD-10-CM | POA: Diagnosis present

## 2014-05-10 DIAGNOSIS — I87331 Chronic venous hypertension (idiopathic) with ulcer and inflammation of right lower extremity: Secondary | ICD-10-CM | POA: Diagnosis not present

## 2014-05-10 DIAGNOSIS — Z6841 Body Mass Index (BMI) 40.0 and over, adult: Secondary | ICD-10-CM | POA: Diagnosis not present

## 2014-05-13 DIAGNOSIS — I87331 Chronic venous hypertension (idiopathic) with ulcer and inflammation of right lower extremity: Secondary | ICD-10-CM | POA: Diagnosis not present

## 2014-05-17 DIAGNOSIS — L97811 Non-pressure chronic ulcer of other part of right lower leg limited to breakdown of skin: Secondary | ICD-10-CM | POA: Diagnosis not present

## 2014-05-17 DIAGNOSIS — Z6841 Body Mass Index (BMI) 40.0 and over, adult: Secondary | ICD-10-CM | POA: Diagnosis not present

## 2014-05-17 DIAGNOSIS — I87331 Chronic venous hypertension (idiopathic) with ulcer and inflammation of right lower extremity: Secondary | ICD-10-CM | POA: Diagnosis not present

## 2014-05-20 DIAGNOSIS — I87331 Chronic venous hypertension (idiopathic) with ulcer and inflammation of right lower extremity: Secondary | ICD-10-CM | POA: Diagnosis not present

## 2014-05-24 DIAGNOSIS — L97811 Non-pressure chronic ulcer of other part of right lower leg limited to breakdown of skin: Secondary | ICD-10-CM | POA: Diagnosis not present

## 2014-05-24 DIAGNOSIS — Z6841 Body Mass Index (BMI) 40.0 and over, adult: Secondary | ICD-10-CM | POA: Diagnosis not present

## 2014-05-24 DIAGNOSIS — I87331 Chronic venous hypertension (idiopathic) with ulcer and inflammation of right lower extremity: Secondary | ICD-10-CM | POA: Diagnosis not present

## 2014-05-31 DIAGNOSIS — L97811 Non-pressure chronic ulcer of other part of right lower leg limited to breakdown of skin: Secondary | ICD-10-CM | POA: Diagnosis not present

## 2014-05-31 DIAGNOSIS — I87331 Chronic venous hypertension (idiopathic) with ulcer and inflammation of right lower extremity: Secondary | ICD-10-CM | POA: Diagnosis not present

## 2014-05-31 DIAGNOSIS — Z6841 Body Mass Index (BMI) 40.0 and over, adult: Secondary | ICD-10-CM | POA: Diagnosis not present

## 2014-06-03 DIAGNOSIS — I87331 Chronic venous hypertension (idiopathic) with ulcer and inflammation of right lower extremity: Secondary | ICD-10-CM | POA: Diagnosis not present

## 2014-06-07 DIAGNOSIS — Z6841 Body Mass Index (BMI) 40.0 and over, adult: Secondary | ICD-10-CM | POA: Diagnosis not present

## 2014-06-07 DIAGNOSIS — I87331 Chronic venous hypertension (idiopathic) with ulcer and inflammation of right lower extremity: Secondary | ICD-10-CM | POA: Diagnosis not present

## 2014-06-07 DIAGNOSIS — L97811 Non-pressure chronic ulcer of other part of right lower leg limited to breakdown of skin: Secondary | ICD-10-CM | POA: Diagnosis not present

## 2014-06-10 ENCOUNTER — Encounter (HOSPITAL_BASED_OUTPATIENT_CLINIC_OR_DEPARTMENT_OTHER): Payer: BLUE CROSS/BLUE SHIELD | Attending: General Surgery

## 2014-06-10 DIAGNOSIS — I890 Lymphedema, not elsewhere classified: Secondary | ICD-10-CM | POA: Diagnosis not present

## 2014-06-10 DIAGNOSIS — L97811 Non-pressure chronic ulcer of other part of right lower leg limited to breakdown of skin: Secondary | ICD-10-CM | POA: Insufficient documentation

## 2014-06-10 DIAGNOSIS — I87331 Chronic venous hypertension (idiopathic) with ulcer and inflammation of right lower extremity: Secondary | ICD-10-CM | POA: Diagnosis not present

## 2014-06-10 DIAGNOSIS — I82491 Acute embolism and thrombosis of other specified deep vein of right lower extremity: Secondary | ICD-10-CM | POA: Insufficient documentation

## 2014-06-14 ENCOUNTER — Encounter (HOSPITAL_BASED_OUTPATIENT_CLINIC_OR_DEPARTMENT_OTHER): Payer: BLUE CROSS/BLUE SHIELD

## 2014-06-14 DIAGNOSIS — L97811 Non-pressure chronic ulcer of other part of right lower leg limited to breakdown of skin: Secondary | ICD-10-CM | POA: Diagnosis not present

## 2014-06-14 DIAGNOSIS — I87331 Chronic venous hypertension (idiopathic) with ulcer and inflammation of right lower extremity: Secondary | ICD-10-CM | POA: Diagnosis not present

## 2014-06-14 DIAGNOSIS — I890 Lymphedema, not elsewhere classified: Secondary | ICD-10-CM | POA: Diagnosis not present

## 2014-06-14 DIAGNOSIS — I82491 Acute embolism and thrombosis of other specified deep vein of right lower extremity: Secondary | ICD-10-CM | POA: Diagnosis not present

## 2014-06-17 DIAGNOSIS — I87331 Chronic venous hypertension (idiopathic) with ulcer and inflammation of right lower extremity: Secondary | ICD-10-CM | POA: Diagnosis not present

## 2014-06-21 DIAGNOSIS — I82491 Acute embolism and thrombosis of other specified deep vein of right lower extremity: Secondary | ICD-10-CM | POA: Diagnosis not present

## 2014-06-21 DIAGNOSIS — I87331 Chronic venous hypertension (idiopathic) with ulcer and inflammation of right lower extremity: Secondary | ICD-10-CM | POA: Diagnosis not present

## 2014-06-21 DIAGNOSIS — L97811 Non-pressure chronic ulcer of other part of right lower leg limited to breakdown of skin: Secondary | ICD-10-CM | POA: Diagnosis not present

## 2014-06-21 DIAGNOSIS — I890 Lymphedema, not elsewhere classified: Secondary | ICD-10-CM | POA: Diagnosis not present

## 2014-06-24 DIAGNOSIS — I87331 Chronic venous hypertension (idiopathic) with ulcer and inflammation of right lower extremity: Secondary | ICD-10-CM | POA: Diagnosis not present

## 2014-06-28 DIAGNOSIS — L97811 Non-pressure chronic ulcer of other part of right lower leg limited to breakdown of skin: Secondary | ICD-10-CM | POA: Diagnosis not present

## 2014-06-28 DIAGNOSIS — I82491 Acute embolism and thrombosis of other specified deep vein of right lower extremity: Secondary | ICD-10-CM | POA: Diagnosis not present

## 2014-06-28 DIAGNOSIS — I890 Lymphedema, not elsewhere classified: Secondary | ICD-10-CM | POA: Diagnosis not present

## 2014-06-28 DIAGNOSIS — I87331 Chronic venous hypertension (idiopathic) with ulcer and inflammation of right lower extremity: Secondary | ICD-10-CM | POA: Diagnosis not present

## 2014-07-05 DIAGNOSIS — I87331 Chronic venous hypertension (idiopathic) with ulcer and inflammation of right lower extremity: Secondary | ICD-10-CM | POA: Diagnosis not present

## 2014-07-08 DIAGNOSIS — I87331 Chronic venous hypertension (idiopathic) with ulcer and inflammation of right lower extremity: Secondary | ICD-10-CM | POA: Diagnosis not present

## 2014-07-12 ENCOUNTER — Encounter (HOSPITAL_BASED_OUTPATIENT_CLINIC_OR_DEPARTMENT_OTHER): Payer: BLUE CROSS/BLUE SHIELD | Attending: General Surgery

## 2014-07-12 DIAGNOSIS — I872 Venous insufficiency (chronic) (peripheral): Secondary | ICD-10-CM | POA: Insufficient documentation

## 2014-07-12 DIAGNOSIS — I739 Peripheral vascular disease, unspecified: Secondary | ICD-10-CM | POA: Insufficient documentation

## 2014-07-12 DIAGNOSIS — L97811 Non-pressure chronic ulcer of other part of right lower leg limited to breakdown of skin: Secondary | ICD-10-CM | POA: Insufficient documentation

## 2014-07-12 DIAGNOSIS — I824Z1 Acute embolism and thrombosis of unspecified deep veins of right distal lower extremity: Secondary | ICD-10-CM | POA: Diagnosis not present

## 2014-07-15 DIAGNOSIS — I872 Venous insufficiency (chronic) (peripheral): Secondary | ICD-10-CM | POA: Diagnosis not present

## 2014-07-19 DIAGNOSIS — I739 Peripheral vascular disease, unspecified: Secondary | ICD-10-CM | POA: Diagnosis not present

## 2014-07-19 DIAGNOSIS — I872 Venous insufficiency (chronic) (peripheral): Secondary | ICD-10-CM | POA: Diagnosis not present

## 2014-07-19 DIAGNOSIS — L97811 Non-pressure chronic ulcer of other part of right lower leg limited to breakdown of skin: Secondary | ICD-10-CM | POA: Diagnosis not present

## 2014-07-19 DIAGNOSIS — I824Z1 Acute embolism and thrombosis of unspecified deep veins of right distal lower extremity: Secondary | ICD-10-CM | POA: Diagnosis not present

## 2014-07-22 DIAGNOSIS — I872 Venous insufficiency (chronic) (peripheral): Secondary | ICD-10-CM | POA: Diagnosis not present

## 2014-07-25 DIAGNOSIS — I824Z1 Acute embolism and thrombosis of unspecified deep veins of right distal lower extremity: Secondary | ICD-10-CM | POA: Diagnosis not present

## 2014-07-25 DIAGNOSIS — L97811 Non-pressure chronic ulcer of other part of right lower leg limited to breakdown of skin: Secondary | ICD-10-CM | POA: Diagnosis not present

## 2014-07-25 DIAGNOSIS — I872 Venous insufficiency (chronic) (peripheral): Secondary | ICD-10-CM | POA: Diagnosis not present

## 2014-07-25 DIAGNOSIS — I739 Peripheral vascular disease, unspecified: Secondary | ICD-10-CM | POA: Diagnosis not present

## 2014-07-29 DIAGNOSIS — I872 Venous insufficiency (chronic) (peripheral): Secondary | ICD-10-CM | POA: Diagnosis not present

## 2014-08-02 DIAGNOSIS — I739 Peripheral vascular disease, unspecified: Secondary | ICD-10-CM | POA: Diagnosis not present

## 2014-08-02 DIAGNOSIS — I824Z1 Acute embolism and thrombosis of unspecified deep veins of right distal lower extremity: Secondary | ICD-10-CM | POA: Diagnosis not present

## 2014-08-02 DIAGNOSIS — I872 Venous insufficiency (chronic) (peripheral): Secondary | ICD-10-CM | POA: Diagnosis not present

## 2014-08-02 DIAGNOSIS — L97811 Non-pressure chronic ulcer of other part of right lower leg limited to breakdown of skin: Secondary | ICD-10-CM | POA: Diagnosis not present

## 2014-08-04 DIAGNOSIS — I872 Venous insufficiency (chronic) (peripheral): Secondary | ICD-10-CM | POA: Diagnosis not present

## 2014-08-09 DIAGNOSIS — I824Z1 Acute embolism and thrombosis of unspecified deep veins of right distal lower extremity: Secondary | ICD-10-CM | POA: Diagnosis not present

## 2014-08-09 DIAGNOSIS — I739 Peripheral vascular disease, unspecified: Secondary | ICD-10-CM | POA: Diagnosis not present

## 2014-08-09 DIAGNOSIS — I872 Venous insufficiency (chronic) (peripheral): Secondary | ICD-10-CM | POA: Diagnosis not present

## 2014-08-09 DIAGNOSIS — L97811 Non-pressure chronic ulcer of other part of right lower leg limited to breakdown of skin: Secondary | ICD-10-CM | POA: Diagnosis not present

## 2014-08-12 ENCOUNTER — Encounter (HOSPITAL_BASED_OUTPATIENT_CLINIC_OR_DEPARTMENT_OTHER): Payer: BLUE CROSS/BLUE SHIELD | Attending: General Surgery

## 2014-08-12 DIAGNOSIS — I87311 Chronic venous hypertension (idiopathic) with ulcer of right lower extremity: Secondary | ICD-10-CM | POA: Insufficient documentation

## 2014-08-12 DIAGNOSIS — E669 Obesity, unspecified: Secondary | ICD-10-CM | POA: Diagnosis not present

## 2014-08-12 DIAGNOSIS — I890 Lymphedema, not elsewhere classified: Secondary | ICD-10-CM | POA: Diagnosis not present

## 2014-08-12 DIAGNOSIS — L97911 Non-pressure chronic ulcer of unspecified part of right lower leg limited to breakdown of skin: Secondary | ICD-10-CM | POA: Insufficient documentation

## 2014-08-16 DIAGNOSIS — I87311 Chronic venous hypertension (idiopathic) with ulcer of right lower extremity: Secondary | ICD-10-CM | POA: Diagnosis not present

## 2014-08-16 DIAGNOSIS — L97911 Non-pressure chronic ulcer of unspecified part of right lower leg limited to breakdown of skin: Secondary | ICD-10-CM | POA: Diagnosis not present

## 2014-08-16 DIAGNOSIS — I890 Lymphedema, not elsewhere classified: Secondary | ICD-10-CM | POA: Diagnosis not present

## 2014-08-16 DIAGNOSIS — E669 Obesity, unspecified: Secondary | ICD-10-CM | POA: Diagnosis not present

## 2014-08-19 DIAGNOSIS — L97911 Non-pressure chronic ulcer of unspecified part of right lower leg limited to breakdown of skin: Secondary | ICD-10-CM | POA: Diagnosis not present

## 2014-08-23 DIAGNOSIS — I890 Lymphedema, not elsewhere classified: Secondary | ICD-10-CM | POA: Diagnosis not present

## 2014-08-23 DIAGNOSIS — I87311 Chronic venous hypertension (idiopathic) with ulcer of right lower extremity: Secondary | ICD-10-CM | POA: Diagnosis not present

## 2014-08-23 DIAGNOSIS — E669 Obesity, unspecified: Secondary | ICD-10-CM | POA: Diagnosis not present

## 2014-08-23 DIAGNOSIS — L97911 Non-pressure chronic ulcer of unspecified part of right lower leg limited to breakdown of skin: Secondary | ICD-10-CM | POA: Diagnosis not present

## 2014-08-26 DIAGNOSIS — L97911 Non-pressure chronic ulcer of unspecified part of right lower leg limited to breakdown of skin: Secondary | ICD-10-CM | POA: Diagnosis not present

## 2014-08-27 ENCOUNTER — Emergency Department (INDEPENDENT_AMBULATORY_CARE_PROVIDER_SITE_OTHER)
Admission: EM | Admit: 2014-08-27 | Discharge: 2014-08-27 | Disposition: A | Discharge status: Home / Self Care | Payer: BLUE CROSS/BLUE SHIELD | Source: Home / Self Care | Attending: Family Medicine | Admitting: Family Medicine

## 2014-08-27 ENCOUNTER — Encounter (HOSPITAL_COMMUNITY): Payer: Self-pay

## 2014-08-27 DIAGNOSIS — R300 Dysuria: Secondary | ICD-10-CM | POA: Diagnosis not present

## 2014-08-27 DIAGNOSIS — H6091 Unspecified otitis externa, right ear: Secondary | ICD-10-CM | POA: Diagnosis not present

## 2014-08-27 DIAGNOSIS — A64 Unspecified sexually transmitted disease: Secondary | ICD-10-CM

## 2014-08-27 DIAGNOSIS — K219 Gastro-esophageal reflux disease without esophagitis: Secondary | ICD-10-CM | POA: Diagnosis not present

## 2014-08-27 DIAGNOSIS — I10 Essential (primary) hypertension: Secondary | ICD-10-CM

## 2014-08-27 LAB — POCT URINALYSIS DIP (DEVICE)
Bilirubin Urine: NEGATIVE
GLUCOSE, UA: NEGATIVE mg/dL
Ketones, ur: NEGATIVE mg/dL
NITRITE: NEGATIVE
PH: 6 (ref 5.0–8.0)
Protein, ur: 30 mg/dL — AB
Specific Gravity, Urine: >=1.03 (ref 1.005–1.030)
UROBILINOGEN UA: 0.2 mg/dL (ref 0.0–1.0)

## 2014-08-27 MED ORDER — LIDOCAINE HCL (PF) 1 % IJ SOLN
INTRAMUSCULAR | Status: AC
  Filled 2014-08-27: qty 5

## 2014-08-27 MED ORDER — NEOMYCIN-POLYMYXIN-HC 3.5-10000-1 OT SUSP: 4.0000 [drp] | Freq: Three times a day (TID) | OTIC | Status: DC

## 2014-08-27 MED ORDER — CIPROFLOXACIN-HYDROCORTISONE 0.2-1 % OT SUSP: 3.0000 [drp] | Freq: Three times a day (TID) | OTIC | Status: DC

## 2014-08-27 MED ORDER — HYDROCHLOROTHIAZIDE 25 MG PO TABS: 25.0000 mg | ORAL_TABLET | Freq: Every day | ORAL | Status: DC

## 2014-08-27 MED ORDER — AZITHROMYCIN 250 MG PO TABS
ORAL_TABLET | ORAL | Status: AC
  Filled 2014-08-27: qty 4

## 2014-08-27 MED ORDER — CEFTRIAXONE SODIUM 250 MG IJ SOLR
INTRAMUSCULAR | Status: AC
  Filled 2014-08-27: qty 250

## 2014-08-27 MED ORDER — RANITIDINE HCL 150 MG PO CAPS: 150.0000 mg | ORAL_CAPSULE | Freq: Every day | ORAL | Status: DC

## 2014-08-27 MED ORDER — CEFTRIAXONE SODIUM 250 MG IJ SOLR
250.0000 mg | Freq: Once | INTRAMUSCULAR | Status: AC
  Administered 2014-08-27: 250 mg via INTRAMUSCULAR

## 2014-08-27 MED ORDER — AZITHROMYCIN 250 MG PO TABS
1000.0000 mg | ORAL_TABLET | Freq: Once | ORAL | Status: AC
  Administered 2014-08-27: 1000 mg via ORAL

## 2014-08-27 NOTE — ED Provider Notes (Addendum)
CSN: [id number]     Arrival date & time 08/27/14  1519 History   First MD Initiated Contact with Patient 08/27/14 1603     Chief Complaint  Patient presents with  . SEXUALLY TRANSMITTED DISEASE  . Otitis Media  . GI Problem   (Consider location/radiation/quality/duration/timing/severity/associated sxs/prior Treatment) HPI  1. STD check:  Dysuria for past 2-3 weeks after unprotected sexual intercourse.  Partner with the same.  She was tested recently, he doesn't know results.  Denies any discharge.  Desires treatment today.  No abdominal pain.  Also desires HIV testing.   2.  Right ear pain:  Also with muffled sounds in ear.  Wears ear buds to listen to music.  Describes as dull ache in ear.  No fevers or chills.    3.  Reflux symptoms:  Has "foul burping" after eating and in evenings.  Accompanied by brash.  Occasional indigestion, better with Tums.  No weight changes.    Past Medical History  Diagnosis Date  . Obesity    History reviewed. No pertinent past surgical history. History reviewed. No pertinent family history. History  Substance Use Topics  . Smoking status: Never Smoker   . Smokeless tobacco: Never Used  . Alcohol Use: 2.0 oz/week    4 drink(s) per week     Comment: liquor    Review of Systems  Allergies  Review of patient's allergies indicates no known allergies.  Home Medications   Prior to Admission medications   Medication Sig Start Date End Date Taking? Authorizing Provider  sulfamethoxazole-trimethoprim (BACTRIM,SEPTRA) 400-80 MG per tablet Take 1 tablet by mouth 2 (two) times daily.   Yes Historical Provider, MD  ciprofloxacin-hydrocortisone (CIPRO HC) otic suspension Place 3 drops into the right ear 3 (three) times daily. 08/27/14   Tobey Grim, MD  ranitidine (ZANTAC) 150 MG capsule Take 1 capsule (150 mg total) by mouth daily. 08/27/14   Tobey Grim, MD   BP 193/123 mmHg  Temp(Src) 99 F (37.2 C) (Oral)  Resp 24  SpO2 95% Physical Exam   BP 193/123 mmHg  Temp(Src) 99 F (37.2 C) (Oral)  Resp 24  SpO2 95% Gen: Well NAD Ears:  Left ear WNL.  RIght ear with clear TM.  Erythematous ear canal with some edema noted on Right.  Neck:  No LAD.  Lungs: CTABL Nl WOB Heart: RRR no MRG Abd: Obese.  Nontender throughout.  Ext:  Wound vac in place.  Chronic.   ED Course  Procedures (including critical care time) Labs Review Labs Reviewed  POCT URINALYSIS DIP (DEVICE) - Abnormal; Notable for the following:    Hgb urine dipstick TRACE (*)    Protein, ur 30 (*)    Leukocytes, UA SMALL (*)    All other components within normal limits  HIV ANTIBODY (ROUTINE TESTING)    Imaging Review No results found.   MDM   1. Dysuria   2. STD (male)   3. Right otitis externa   4. Gastroesophageal reflux disease, esophagitis presence not specified   5. Essential hypertension    - treated STD here.  Azithro plus Rocephin - Treat GERD with reflux.  - Cipro otic for otitis externa.  - Patient also desires HIV test. GC/Chlamyia pending.  - HTN:  Follow up with PCP in 1 week for previuosly scheduled appointment.  Started on HCTZ 12.5 mg today.  Warning precautions provided.    ** Addendum:  patietn called back and could not afford Cipro otic.  Sent  in cortisporin otic for him.     Tobey Grim, MD 08/27/14 1646  Tobey Grim, MD 08/27/14 [phone]

## 2014-08-27 NOTE — ED Notes (Signed)
Call back number for labs verified 

## 2014-08-27 NOTE — Discharge Instructions (Signed)
We have treated you for an STD today.  You should receive these results in a day or two.   Use the ear drops in your right ear ear as prescribed.    Use the Ranitidine once daily for the burping and reflux symptoms.    Follow up with your regular doctor for your blood pressure. Take the HCTZ 25 mg once daily until you see your doctor.

## 2014-08-27 NOTE — ED Notes (Signed)
C/o discomfort w urination , concern for STD. Also states he has an ear infection. Also concern for gassy sulfur burping a couple of times a month for a couple of months ( on sulfa for leg/foot ulcers)

## 2014-08-28 LAB — HIV ANTIBODY (ROUTINE TESTING W REFLEX): HIV SCREEN 4TH GENERATION: NONREACTIVE

## 2014-08-30 DIAGNOSIS — I890 Lymphedema, not elsewhere classified: Secondary | ICD-10-CM | POA: Diagnosis not present

## 2014-08-30 DIAGNOSIS — L97911 Non-pressure chronic ulcer of unspecified part of right lower leg limited to breakdown of skin: Secondary | ICD-10-CM | POA: Diagnosis not present

## 2014-08-30 DIAGNOSIS — E669 Obesity, unspecified: Secondary | ICD-10-CM | POA: Diagnosis not present

## 2014-08-30 DIAGNOSIS — I87311 Chronic venous hypertension (idiopathic) with ulcer of right lower extremity: Secondary | ICD-10-CM | POA: Diagnosis not present

## 2014-09-02 DIAGNOSIS — L97911 Non-pressure chronic ulcer of unspecified part of right lower leg limited to breakdown of skin: Secondary | ICD-10-CM | POA: Diagnosis not present

## 2014-09-06 DIAGNOSIS — I87311 Chronic venous hypertension (idiopathic) with ulcer of right lower extremity: Secondary | ICD-10-CM | POA: Diagnosis not present

## 2014-09-06 DIAGNOSIS — L97911 Non-pressure chronic ulcer of unspecified part of right lower leg limited to breakdown of skin: Secondary | ICD-10-CM | POA: Diagnosis not present

## 2014-09-06 DIAGNOSIS — I890 Lymphedema, not elsewhere classified: Secondary | ICD-10-CM | POA: Diagnosis not present

## 2014-09-06 DIAGNOSIS — E669 Obesity, unspecified: Secondary | ICD-10-CM | POA: Diagnosis not present

## 2014-09-09 ENCOUNTER — Encounter (HOSPITAL_BASED_OUTPATIENT_CLINIC_OR_DEPARTMENT_OTHER): Payer: BLUE CROSS/BLUE SHIELD | Attending: Internal Medicine

## 2014-09-09 DIAGNOSIS — I890 Lymphedema, not elsewhere classified: Secondary | ICD-10-CM | POA: Insufficient documentation

## 2014-09-09 DIAGNOSIS — I739 Peripheral vascular disease, unspecified: Secondary | ICD-10-CM | POA: Diagnosis not present

## 2014-09-09 DIAGNOSIS — I10 Essential (primary) hypertension: Secondary | ICD-10-CM | POA: Insufficient documentation

## 2014-09-09 DIAGNOSIS — I872 Venous insufficiency (chronic) (peripheral): Secondary | ICD-10-CM | POA: Insufficient documentation

## 2014-09-09 DIAGNOSIS — L97821 Non-pressure chronic ulcer of other part of left lower leg limited to breakdown of skin: Secondary | ICD-10-CM | POA: Insufficient documentation

## 2014-09-09 DIAGNOSIS — Z86718 Personal history of other venous thrombosis and embolism: Secondary | ICD-10-CM | POA: Insufficient documentation

## 2014-09-09 DIAGNOSIS — Z6841 Body Mass Index (BMI) 40.0 and over, adult: Secondary | ICD-10-CM | POA: Diagnosis not present

## 2014-09-09 DIAGNOSIS — E669 Obesity, unspecified: Secondary | ICD-10-CM | POA: Diagnosis not present

## 2014-09-09 DIAGNOSIS — L97811 Non-pressure chronic ulcer of other part of right lower leg limited to breakdown of skin: Secondary | ICD-10-CM | POA: Diagnosis present

## 2014-09-09 DIAGNOSIS — L97812 Non-pressure chronic ulcer of other part of right lower leg with fat layer exposed: Secondary | ICD-10-CM | POA: Insufficient documentation

## 2014-09-13 DIAGNOSIS — I872 Venous insufficiency (chronic) (peripheral): Secondary | ICD-10-CM | POA: Diagnosis not present

## 2014-09-13 DIAGNOSIS — L97812 Non-pressure chronic ulcer of other part of right lower leg with fat layer exposed: Secondary | ICD-10-CM | POA: Diagnosis not present

## 2014-09-13 DIAGNOSIS — Z86718 Personal history of other venous thrombosis and embolism: Secondary | ICD-10-CM | POA: Diagnosis not present

## 2014-09-13 DIAGNOSIS — L97821 Non-pressure chronic ulcer of other part of left lower leg limited to breakdown of skin: Secondary | ICD-10-CM | POA: Diagnosis not present

## 2014-09-20 DIAGNOSIS — I872 Venous insufficiency (chronic) (peripheral): Secondary | ICD-10-CM | POA: Diagnosis not present

## 2014-09-20 DIAGNOSIS — L97812 Non-pressure chronic ulcer of other part of right lower leg with fat layer exposed: Secondary | ICD-10-CM | POA: Diagnosis not present

## 2014-09-20 DIAGNOSIS — L97821 Non-pressure chronic ulcer of other part of left lower leg limited to breakdown of skin: Secondary | ICD-10-CM | POA: Diagnosis not present

## 2014-09-20 DIAGNOSIS — Z86718 Personal history of other venous thrombosis and embolism: Secondary | ICD-10-CM | POA: Diagnosis not present

## 2014-09-23 DIAGNOSIS — L97812 Non-pressure chronic ulcer of other part of right lower leg with fat layer exposed: Secondary | ICD-10-CM | POA: Diagnosis not present

## 2014-09-27 DIAGNOSIS — Z86718 Personal history of other venous thrombosis and embolism: Secondary | ICD-10-CM | POA: Diagnosis not present

## 2014-09-27 DIAGNOSIS — L97821 Non-pressure chronic ulcer of other part of left lower leg limited to breakdown of skin: Secondary | ICD-10-CM | POA: Diagnosis not present

## 2014-09-27 DIAGNOSIS — L97812 Non-pressure chronic ulcer of other part of right lower leg with fat layer exposed: Secondary | ICD-10-CM | POA: Diagnosis not present

## 2014-09-27 DIAGNOSIS — I872 Venous insufficiency (chronic) (peripheral): Secondary | ICD-10-CM | POA: Diagnosis not present

## 2014-09-30 DIAGNOSIS — L97812 Non-pressure chronic ulcer of other part of right lower leg with fat layer exposed: Secondary | ICD-10-CM | POA: Diagnosis not present

## 2014-10-04 DIAGNOSIS — I872 Venous insufficiency (chronic) (peripheral): Secondary | ICD-10-CM | POA: Diagnosis not present

## 2014-10-04 DIAGNOSIS — Z86718 Personal history of other venous thrombosis and embolism: Secondary | ICD-10-CM | POA: Diagnosis not present

## 2014-10-04 DIAGNOSIS — L97821 Non-pressure chronic ulcer of other part of left lower leg limited to breakdown of skin: Secondary | ICD-10-CM | POA: Diagnosis not present

## 2014-10-04 DIAGNOSIS — L97812 Non-pressure chronic ulcer of other part of right lower leg with fat layer exposed: Secondary | ICD-10-CM | POA: Diagnosis not present

## 2014-10-11 ENCOUNTER — Encounter (HOSPITAL_BASED_OUTPATIENT_CLINIC_OR_DEPARTMENT_OTHER): Payer: BLUE CROSS/BLUE SHIELD | Attending: General Surgery

## 2014-10-11 DIAGNOSIS — L97812 Non-pressure chronic ulcer of other part of right lower leg with fat layer exposed: Secondary | ICD-10-CM | POA: Insufficient documentation

## 2014-10-11 DIAGNOSIS — Z6841 Body Mass Index (BMI) 40.0 and over, adult: Secondary | ICD-10-CM | POA: Insufficient documentation

## 2014-10-11 DIAGNOSIS — I10 Essential (primary) hypertension: Secondary | ICD-10-CM | POA: Diagnosis not present

## 2014-10-11 DIAGNOSIS — E669 Obesity, unspecified: Secondary | ICD-10-CM | POA: Insufficient documentation

## 2014-10-11 DIAGNOSIS — I739 Peripheral vascular disease, unspecified: Secondary | ICD-10-CM | POA: Insufficient documentation

## 2014-10-11 DIAGNOSIS — Z86718 Personal history of other venous thrombosis and embolism: Secondary | ICD-10-CM | POA: Insufficient documentation

## 2014-10-11 DIAGNOSIS — I890 Lymphedema, not elsewhere classified: Secondary | ICD-10-CM | POA: Diagnosis not present

## 2014-10-14 DIAGNOSIS — L97812 Non-pressure chronic ulcer of other part of right lower leg with fat layer exposed: Secondary | ICD-10-CM | POA: Diagnosis not present

## 2014-10-18 DIAGNOSIS — E669 Obesity, unspecified: Secondary | ICD-10-CM | POA: Diagnosis not present

## 2014-10-18 DIAGNOSIS — L97812 Non-pressure chronic ulcer of other part of right lower leg with fat layer exposed: Secondary | ICD-10-CM | POA: Diagnosis not present

## 2014-10-18 DIAGNOSIS — I890 Lymphedema, not elsewhere classified: Secondary | ICD-10-CM | POA: Diagnosis not present

## 2014-10-18 DIAGNOSIS — Z6841 Body Mass Index (BMI) 40.0 and over, adult: Secondary | ICD-10-CM | POA: Diagnosis not present

## 2014-10-21 DIAGNOSIS — L97812 Non-pressure chronic ulcer of other part of right lower leg with fat layer exposed: Secondary | ICD-10-CM | POA: Diagnosis not present

## 2014-10-26 DIAGNOSIS — L97812 Non-pressure chronic ulcer of other part of right lower leg with fat layer exposed: Secondary | ICD-10-CM | POA: Diagnosis not present

## 2014-11-01 DIAGNOSIS — I890 Lymphedema, not elsewhere classified: Secondary | ICD-10-CM | POA: Diagnosis not present

## 2014-11-01 DIAGNOSIS — E669 Obesity, unspecified: Secondary | ICD-10-CM | POA: Diagnosis not present

## 2014-11-01 DIAGNOSIS — Z6841 Body Mass Index (BMI) 40.0 and over, adult: Secondary | ICD-10-CM | POA: Diagnosis not present

## 2014-11-01 DIAGNOSIS — L97812 Non-pressure chronic ulcer of other part of right lower leg with fat layer exposed: Secondary | ICD-10-CM | POA: Diagnosis not present

## 2014-11-08 DIAGNOSIS — L97812 Non-pressure chronic ulcer of other part of right lower leg with fat layer exposed: Secondary | ICD-10-CM | POA: Diagnosis not present

## 2014-11-08 DIAGNOSIS — E669 Obesity, unspecified: Secondary | ICD-10-CM | POA: Diagnosis not present

## 2014-11-08 DIAGNOSIS — I890 Lymphedema, not elsewhere classified: Secondary | ICD-10-CM | POA: Diagnosis not present

## 2014-11-08 DIAGNOSIS — Z6841 Body Mass Index (BMI) 40.0 and over, adult: Secondary | ICD-10-CM | POA: Diagnosis not present

## 2014-11-17 ENCOUNTER — Encounter (HOSPITAL_BASED_OUTPATIENT_CLINIC_OR_DEPARTMENT_OTHER): Payer: BLUE CROSS/BLUE SHIELD | Attending: Internal Medicine

## 2014-11-17 DIAGNOSIS — I890 Lymphedema, not elsewhere classified: Secondary | ICD-10-CM | POA: Insufficient documentation

## 2014-11-17 DIAGNOSIS — I83018 Varicose veins of right lower extremity with ulcer other part of lower leg: Secondary | ICD-10-CM | POA: Insufficient documentation

## 2014-11-17 DIAGNOSIS — L97811 Non-pressure chronic ulcer of other part of right lower leg limited to breakdown of skin: Secondary | ICD-10-CM | POA: Diagnosis not present

## 2014-11-17 DIAGNOSIS — Z86718 Personal history of other venous thrombosis and embolism: Secondary | ICD-10-CM | POA: Insufficient documentation

## 2014-11-17 DIAGNOSIS — I10 Essential (primary) hypertension: Secondary | ICD-10-CM | POA: Insufficient documentation

## 2014-12-13 ENCOUNTER — Encounter (HOSPITAL_BASED_OUTPATIENT_CLINIC_OR_DEPARTMENT_OTHER): Payer: BLUE CROSS/BLUE SHIELD | Attending: General Surgery

## 2014-12-13 DIAGNOSIS — I739 Peripheral vascular disease, unspecified: Secondary | ICD-10-CM | POA: Insufficient documentation

## 2014-12-13 DIAGNOSIS — Z86718 Personal history of other venous thrombosis and embolism: Secondary | ICD-10-CM | POA: Insufficient documentation

## 2014-12-13 DIAGNOSIS — Z6841 Body Mass Index (BMI) 40.0 and over, adult: Secondary | ICD-10-CM | POA: Insufficient documentation

## 2014-12-13 DIAGNOSIS — I890 Lymphedema, not elsewhere classified: Secondary | ICD-10-CM | POA: Insufficient documentation

## 2014-12-13 DIAGNOSIS — I10 Essential (primary) hypertension: Secondary | ICD-10-CM | POA: Insufficient documentation

## 2014-12-13 DIAGNOSIS — E669 Obesity, unspecified: Secondary | ICD-10-CM | POA: Insufficient documentation

## 2015-01-03 ENCOUNTER — Encounter (HOSPITAL_BASED_OUTPATIENT_CLINIC_OR_DEPARTMENT_OTHER): Payer: BLUE CROSS/BLUE SHIELD | Attending: General Surgery

## 2015-01-03 DIAGNOSIS — Z6841 Body Mass Index (BMI) 40.0 and over, adult: Secondary | ICD-10-CM | POA: Diagnosis not present

## 2015-01-03 DIAGNOSIS — I890 Lymphedema, not elsewhere classified: Secondary | ICD-10-CM | POA: Diagnosis not present

## 2015-01-03 DIAGNOSIS — Z86718 Personal history of other venous thrombosis and embolism: Secondary | ICD-10-CM | POA: Diagnosis not present

## 2015-01-03 DIAGNOSIS — E669 Obesity, unspecified: Secondary | ICD-10-CM | POA: Diagnosis not present

## 2015-01-03 DIAGNOSIS — L97812 Non-pressure chronic ulcer of other part of right lower leg with fat layer exposed: Secondary | ICD-10-CM | POA: Diagnosis present

## 2015-01-03 DIAGNOSIS — I739 Peripheral vascular disease, unspecified: Secondary | ICD-10-CM | POA: Diagnosis not present

## 2015-01-03 DIAGNOSIS — I10 Essential (primary) hypertension: Secondary | ICD-10-CM | POA: Diagnosis not present

## 2015-01-24 ENCOUNTER — Encounter (HOSPITAL_BASED_OUTPATIENT_CLINIC_OR_DEPARTMENT_OTHER): Payer: BLUE CROSS/BLUE SHIELD | Attending: General Surgery

## 2015-01-24 DIAGNOSIS — I890 Lymphedema, not elsewhere classified: Secondary | ICD-10-CM | POA: Diagnosis not present

## 2015-01-24 DIAGNOSIS — L97819 Non-pressure chronic ulcer of other part of right lower leg with unspecified severity: Secondary | ICD-10-CM | POA: Insufficient documentation

## 2015-01-24 DIAGNOSIS — I87311 Chronic venous hypertension (idiopathic) with ulcer of right lower extremity: Secondary | ICD-10-CM | POA: Insufficient documentation

## 2015-01-24 DIAGNOSIS — Z86718 Personal history of other venous thrombosis and embolism: Secondary | ICD-10-CM | POA: Diagnosis not present

## 2015-01-24 DIAGNOSIS — I10 Essential (primary) hypertension: Secondary | ICD-10-CM | POA: Insufficient documentation

## 2015-02-14 ENCOUNTER — Encounter (HOSPITAL_BASED_OUTPATIENT_CLINIC_OR_DEPARTMENT_OTHER): Payer: BLUE CROSS/BLUE SHIELD | Attending: General Surgery

## 2015-02-14 DIAGNOSIS — L97811 Non-pressure chronic ulcer of other part of right lower leg limited to breakdown of skin: Secondary | ICD-10-CM | POA: Insufficient documentation

## 2015-02-14 DIAGNOSIS — I10 Essential (primary) hypertension: Secondary | ICD-10-CM | POA: Diagnosis not present

## 2015-02-14 DIAGNOSIS — I890 Lymphedema, not elsewhere classified: Secondary | ICD-10-CM | POA: Diagnosis not present

## 2015-02-14 DIAGNOSIS — Z86718 Personal history of other venous thrombosis and embolism: Secondary | ICD-10-CM | POA: Insufficient documentation

## 2015-02-14 DIAGNOSIS — Z6841 Body Mass Index (BMI) 40.0 and over, adult: Secondary | ICD-10-CM | POA: Insufficient documentation

## 2015-02-14 DIAGNOSIS — E669 Obesity, unspecified: Secondary | ICD-10-CM | POA: Insufficient documentation

## 2015-02-14 DIAGNOSIS — I87311 Chronic venous hypertension (idiopathic) with ulcer of right lower extremity: Secondary | ICD-10-CM | POA: Diagnosis present

## 2015-03-07 DIAGNOSIS — Z6841 Body Mass Index (BMI) 40.0 and over, adult: Secondary | ICD-10-CM | POA: Diagnosis not present

## 2015-03-07 DIAGNOSIS — E669 Obesity, unspecified: Secondary | ICD-10-CM | POA: Diagnosis not present

## 2015-03-07 DIAGNOSIS — L97811 Non-pressure chronic ulcer of other part of right lower leg limited to breakdown of skin: Secondary | ICD-10-CM | POA: Diagnosis not present

## 2015-03-07 DIAGNOSIS — Z86718 Personal history of other venous thrombosis and embolism: Secondary | ICD-10-CM | POA: Diagnosis not present

## 2015-03-21 ENCOUNTER — Encounter (HOSPITAL_BASED_OUTPATIENT_CLINIC_OR_DEPARTMENT_OTHER): Payer: BLUE CROSS/BLUE SHIELD | Attending: Internal Medicine

## 2015-03-21 DIAGNOSIS — I890 Lymphedema, not elsewhere classified: Secondary | ICD-10-CM | POA: Insufficient documentation

## 2015-03-21 DIAGNOSIS — I10 Essential (primary) hypertension: Secondary | ICD-10-CM | POA: Diagnosis not present

## 2015-03-21 DIAGNOSIS — L97211 Non-pressure chronic ulcer of right calf limited to breakdown of skin: Secondary | ICD-10-CM | POA: Diagnosis not present

## 2015-03-21 DIAGNOSIS — I87311 Chronic venous hypertension (idiopathic) with ulcer of right lower extremity: Secondary | ICD-10-CM | POA: Insufficient documentation

## 2015-03-21 DIAGNOSIS — Z86718 Personal history of other venous thrombosis and embolism: Secondary | ICD-10-CM | POA: Insufficient documentation

## 2015-03-21 DIAGNOSIS — Z6841 Body Mass Index (BMI) 40.0 and over, adult: Secondary | ICD-10-CM | POA: Insufficient documentation

## 2015-03-21 DIAGNOSIS — L97811 Non-pressure chronic ulcer of other part of right lower leg limited to breakdown of skin: Secondary | ICD-10-CM | POA: Diagnosis not present

## 2015-03-21 DIAGNOSIS — E669 Obesity, unspecified: Secondary | ICD-10-CM | POA: Diagnosis not present

## 2015-04-11 DIAGNOSIS — I87311 Chronic venous hypertension (idiopathic) with ulcer of right lower extremity: Secondary | ICD-10-CM | POA: Diagnosis not present

## 2015-04-20 ENCOUNTER — Encounter (HOSPITAL_BASED_OUTPATIENT_CLINIC_OR_DEPARTMENT_OTHER): Payer: Managed Care, Other (non HMO) | Attending: Internal Medicine

## 2015-04-20 DIAGNOSIS — Z86718 Personal history of other venous thrombosis and embolism: Secondary | ICD-10-CM | POA: Insufficient documentation

## 2015-04-20 DIAGNOSIS — I739 Peripheral vascular disease, unspecified: Secondary | ICD-10-CM | POA: Insufficient documentation

## 2015-04-20 DIAGNOSIS — L97819 Non-pressure chronic ulcer of other part of right lower leg with unspecified severity: Secondary | ICD-10-CM | POA: Insufficient documentation

## 2015-04-20 DIAGNOSIS — I10 Essential (primary) hypertension: Secondary | ICD-10-CM | POA: Insufficient documentation

## 2015-04-20 DIAGNOSIS — L97811 Non-pressure chronic ulcer of other part of right lower leg limited to breakdown of skin: Secondary | ICD-10-CM | POA: Diagnosis not present

## 2015-04-20 DIAGNOSIS — I87311 Chronic venous hypertension (idiopathic) with ulcer of right lower extremity: Secondary | ICD-10-CM | POA: Diagnosis not present

## 2015-04-20 DIAGNOSIS — I890 Lymphedema, not elsewhere classified: Secondary | ICD-10-CM | POA: Insufficient documentation

## 2015-05-02 DIAGNOSIS — I87311 Chronic venous hypertension (idiopathic) with ulcer of right lower extremity: Secondary | ICD-10-CM | POA: Diagnosis not present

## 2015-05-16 ENCOUNTER — Encounter (HOSPITAL_BASED_OUTPATIENT_CLINIC_OR_DEPARTMENT_OTHER): Payer: Managed Care, Other (non HMO) | Attending: Surgery

## 2015-05-16 DIAGNOSIS — I890 Lymphedema, not elsewhere classified: Secondary | ICD-10-CM | POA: Diagnosis not present

## 2015-05-16 DIAGNOSIS — Z86718 Personal history of other venous thrombosis and embolism: Secondary | ICD-10-CM | POA: Diagnosis not present

## 2015-05-16 DIAGNOSIS — L97811 Non-pressure chronic ulcer of other part of right lower leg limited to breakdown of skin: Secondary | ICD-10-CM | POA: Insufficient documentation

## 2015-05-16 DIAGNOSIS — L97211 Non-pressure chronic ulcer of right calf limited to breakdown of skin: Secondary | ICD-10-CM | POA: Insufficient documentation

## 2015-05-16 DIAGNOSIS — I87311 Chronic venous hypertension (idiopathic) with ulcer of right lower extremity: Secondary | ICD-10-CM | POA: Diagnosis present

## 2015-05-16 DIAGNOSIS — I10 Essential (primary) hypertension: Secondary | ICD-10-CM | POA: Diagnosis not present

## 2015-05-30 DIAGNOSIS — I87311 Chronic venous hypertension (idiopathic) with ulcer of right lower extremity: Secondary | ICD-10-CM | POA: Diagnosis not present

## 2015-06-13 ENCOUNTER — Encounter (HOSPITAL_BASED_OUTPATIENT_CLINIC_OR_DEPARTMENT_OTHER): Payer: Managed Care, Other (non HMO) | Attending: Surgery

## 2015-06-13 DIAGNOSIS — L97211 Non-pressure chronic ulcer of right calf limited to breakdown of skin: Secondary | ICD-10-CM | POA: Insufficient documentation

## 2015-06-13 DIAGNOSIS — I87311 Chronic venous hypertension (idiopathic) with ulcer of right lower extremity: Secondary | ICD-10-CM | POA: Diagnosis present

## 2015-06-13 DIAGNOSIS — I890 Lymphedema, not elsewhere classified: Secondary | ICD-10-CM | POA: Diagnosis not present

## 2015-06-13 DIAGNOSIS — Z86718 Personal history of other venous thrombosis and embolism: Secondary | ICD-10-CM | POA: Insufficient documentation

## 2015-06-13 DIAGNOSIS — Z6841 Body Mass Index (BMI) 40.0 and over, adult: Secondary | ICD-10-CM | POA: Diagnosis not present

## 2015-06-13 DIAGNOSIS — E669 Obesity, unspecified: Secondary | ICD-10-CM | POA: Insufficient documentation

## 2015-06-27 DIAGNOSIS — I87311 Chronic venous hypertension (idiopathic) with ulcer of right lower extremity: Secondary | ICD-10-CM | POA: Diagnosis not present

## 2015-07-11 ENCOUNTER — Encounter (HOSPITAL_BASED_OUTPATIENT_CLINIC_OR_DEPARTMENT_OTHER): Payer: Managed Care, Other (non HMO) | Attending: Surgery

## 2015-07-11 DIAGNOSIS — L97811 Non-pressure chronic ulcer of other part of right lower leg limited to breakdown of skin: Secondary | ICD-10-CM | POA: Diagnosis not present

## 2015-07-11 DIAGNOSIS — Z86718 Personal history of other venous thrombosis and embolism: Secondary | ICD-10-CM | POA: Diagnosis not present

## 2015-07-11 DIAGNOSIS — I10 Essential (primary) hypertension: Secondary | ICD-10-CM | POA: Insufficient documentation

## 2015-07-11 DIAGNOSIS — I87311 Chronic venous hypertension (idiopathic) with ulcer of right lower extremity: Secondary | ICD-10-CM | POA: Diagnosis not present

## 2015-07-11 DIAGNOSIS — L97211 Non-pressure chronic ulcer of right calf limited to breakdown of skin: Secondary | ICD-10-CM | POA: Diagnosis not present

## 2015-07-25 DIAGNOSIS — I87311 Chronic venous hypertension (idiopathic) with ulcer of right lower extremity: Secondary | ICD-10-CM | POA: Diagnosis not present

## 2015-08-01 DIAGNOSIS — I87311 Chronic venous hypertension (idiopathic) with ulcer of right lower extremity: Secondary | ICD-10-CM | POA: Diagnosis not present

## 2015-08-08 DIAGNOSIS — I87311 Chronic venous hypertension (idiopathic) with ulcer of right lower extremity: Secondary | ICD-10-CM | POA: Diagnosis not present

## 2015-08-15 ENCOUNTER — Encounter (HOSPITAL_BASED_OUTPATIENT_CLINIC_OR_DEPARTMENT_OTHER): Payer: Managed Care, Other (non HMO) | Attending: Surgery

## 2015-08-15 DIAGNOSIS — I890 Lymphedema, not elsewhere classified: Secondary | ICD-10-CM | POA: Insufficient documentation

## 2015-08-15 DIAGNOSIS — L97222 Non-pressure chronic ulcer of left calf with fat layer exposed: Secondary | ICD-10-CM | POA: Diagnosis not present

## 2015-08-15 DIAGNOSIS — L97211 Non-pressure chronic ulcer of right calf limited to breakdown of skin: Secondary | ICD-10-CM | POA: Diagnosis not present

## 2015-08-15 DIAGNOSIS — Z6841 Body Mass Index (BMI) 40.0 and over, adult: Secondary | ICD-10-CM | POA: Insufficient documentation

## 2015-08-15 DIAGNOSIS — E669 Obesity, unspecified: Secondary | ICD-10-CM | POA: Diagnosis not present

## 2015-08-15 DIAGNOSIS — I87311 Chronic venous hypertension (idiopathic) with ulcer of right lower extremity: Secondary | ICD-10-CM | POA: Insufficient documentation

## 2015-08-29 DIAGNOSIS — I87311 Chronic venous hypertension (idiopathic) with ulcer of right lower extremity: Secondary | ICD-10-CM | POA: Diagnosis not present

## 2015-09-05 DIAGNOSIS — I87311 Chronic venous hypertension (idiopathic) with ulcer of right lower extremity: Secondary | ICD-10-CM | POA: Diagnosis not present

## 2015-09-13 ENCOUNTER — Encounter (HOSPITAL_BASED_OUTPATIENT_CLINIC_OR_DEPARTMENT_OTHER): Payer: Managed Care, Other (non HMO) | Attending: Surgery

## 2015-09-13 DIAGNOSIS — L97812 Non-pressure chronic ulcer of other part of right lower leg with fat layer exposed: Secondary | ICD-10-CM | POA: Insufficient documentation

## 2015-09-13 DIAGNOSIS — Z86718 Personal history of other venous thrombosis and embolism: Secondary | ICD-10-CM | POA: Insufficient documentation

## 2015-09-13 DIAGNOSIS — L97212 Non-pressure chronic ulcer of right calf with fat layer exposed: Secondary | ICD-10-CM | POA: Insufficient documentation

## 2015-09-13 DIAGNOSIS — I87311 Chronic venous hypertension (idiopathic) with ulcer of right lower extremity: Secondary | ICD-10-CM | POA: Insufficient documentation

## 2015-09-13 DIAGNOSIS — I10 Essential (primary) hypertension: Secondary | ICD-10-CM | POA: Insufficient documentation

## 2015-09-20 DIAGNOSIS — L97212 Non-pressure chronic ulcer of right calf with fat layer exposed: Secondary | ICD-10-CM | POA: Diagnosis not present

## 2015-09-20 DIAGNOSIS — L97812 Non-pressure chronic ulcer of other part of right lower leg with fat layer exposed: Secondary | ICD-10-CM | POA: Diagnosis not present

## 2015-09-20 DIAGNOSIS — I10 Essential (primary) hypertension: Secondary | ICD-10-CM | POA: Diagnosis not present

## 2015-09-20 DIAGNOSIS — I87311 Chronic venous hypertension (idiopathic) with ulcer of right lower extremity: Secondary | ICD-10-CM | POA: Diagnosis present

## 2015-09-20 DIAGNOSIS — Z86718 Personal history of other venous thrombosis and embolism: Secondary | ICD-10-CM | POA: Diagnosis not present

## 2015-09-26 DIAGNOSIS — I87311 Chronic venous hypertension (idiopathic) with ulcer of right lower extremity: Secondary | ICD-10-CM | POA: Diagnosis not present

## 2015-10-03 DIAGNOSIS — I87311 Chronic venous hypertension (idiopathic) with ulcer of right lower extremity: Secondary | ICD-10-CM | POA: Diagnosis not present

## 2015-10-10 ENCOUNTER — Encounter (HOSPITAL_BASED_OUTPATIENT_CLINIC_OR_DEPARTMENT_OTHER): Payer: Managed Care, Other (non HMO) | Attending: Surgery

## 2015-10-10 DIAGNOSIS — I739 Peripheral vascular disease, unspecified: Secondary | ICD-10-CM | POA: Diagnosis not present

## 2015-10-10 DIAGNOSIS — Z86718 Personal history of other venous thrombosis and embolism: Secondary | ICD-10-CM | POA: Insufficient documentation

## 2015-10-10 DIAGNOSIS — I87311 Chronic venous hypertension (idiopathic) with ulcer of right lower extremity: Secondary | ICD-10-CM | POA: Insufficient documentation

## 2015-10-10 DIAGNOSIS — L97812 Non-pressure chronic ulcer of other part of right lower leg with fat layer exposed: Secondary | ICD-10-CM | POA: Diagnosis not present

## 2015-10-10 DIAGNOSIS — I10 Essential (primary) hypertension: Secondary | ICD-10-CM | POA: Insufficient documentation

## 2015-10-10 DIAGNOSIS — I890 Lymphedema, not elsewhere classified: Secondary | ICD-10-CM | POA: Diagnosis not present

## 2015-10-10 DIAGNOSIS — L97212 Non-pressure chronic ulcer of right calf with fat layer exposed: Secondary | ICD-10-CM | POA: Diagnosis not present

## 2015-10-17 DIAGNOSIS — I87311 Chronic venous hypertension (idiopathic) with ulcer of right lower extremity: Secondary | ICD-10-CM | POA: Diagnosis not present

## 2015-10-24 DIAGNOSIS — I87311 Chronic venous hypertension (idiopathic) with ulcer of right lower extremity: Secondary | ICD-10-CM | POA: Diagnosis not present

## 2015-11-01 DIAGNOSIS — I87311 Chronic venous hypertension (idiopathic) with ulcer of right lower extremity: Secondary | ICD-10-CM | POA: Diagnosis not present

## 2015-11-07 DIAGNOSIS — I87311 Chronic venous hypertension (idiopathic) with ulcer of right lower extremity: Secondary | ICD-10-CM | POA: Diagnosis not present

## 2015-11-14 ENCOUNTER — Encounter (HOSPITAL_BASED_OUTPATIENT_CLINIC_OR_DEPARTMENT_OTHER): Payer: Managed Care, Other (non HMO) | Attending: Surgery

## 2015-11-14 DIAGNOSIS — I890 Lymphedema, not elsewhere classified: Secondary | ICD-10-CM | POA: Insufficient documentation

## 2015-11-14 DIAGNOSIS — L97819 Non-pressure chronic ulcer of other part of right lower leg with unspecified severity: Secondary | ICD-10-CM | POA: Insufficient documentation

## 2015-11-14 DIAGNOSIS — Z86718 Personal history of other venous thrombosis and embolism: Secondary | ICD-10-CM | POA: Diagnosis not present

## 2015-11-14 DIAGNOSIS — L97811 Non-pressure chronic ulcer of other part of right lower leg limited to breakdown of skin: Secondary | ICD-10-CM | POA: Diagnosis not present

## 2015-11-14 DIAGNOSIS — I739 Peripheral vascular disease, unspecified: Secondary | ICD-10-CM | POA: Diagnosis not present

## 2015-11-14 DIAGNOSIS — I87311 Chronic venous hypertension (idiopathic) with ulcer of right lower extremity: Secondary | ICD-10-CM | POA: Diagnosis not present

## 2015-11-14 DIAGNOSIS — I10 Essential (primary) hypertension: Secondary | ICD-10-CM | POA: Diagnosis not present

## 2015-11-14 DIAGNOSIS — L97219 Non-pressure chronic ulcer of right calf with unspecified severity: Secondary | ICD-10-CM | POA: Diagnosis not present

## 2015-11-21 DIAGNOSIS — I87311 Chronic venous hypertension (idiopathic) with ulcer of right lower extremity: Secondary | ICD-10-CM | POA: Diagnosis not present

## 2015-11-28 DIAGNOSIS — I87311 Chronic venous hypertension (idiopathic) with ulcer of right lower extremity: Secondary | ICD-10-CM | POA: Diagnosis not present

## 2015-12-05 DIAGNOSIS — I87311 Chronic venous hypertension (idiopathic) with ulcer of right lower extremity: Secondary | ICD-10-CM | POA: Diagnosis not present

## 2015-12-12 ENCOUNTER — Encounter (HOSPITAL_BASED_OUTPATIENT_CLINIC_OR_DEPARTMENT_OTHER): Payer: Managed Care, Other (non HMO) | Attending: Surgery

## 2015-12-12 DIAGNOSIS — I87311 Chronic venous hypertension (idiopathic) with ulcer of right lower extremity: Secondary | ICD-10-CM | POA: Insufficient documentation

## 2015-12-12 DIAGNOSIS — L97212 Non-pressure chronic ulcer of right calf with fat layer exposed: Secondary | ICD-10-CM | POA: Insufficient documentation

## 2015-12-12 DIAGNOSIS — L97812 Non-pressure chronic ulcer of other part of right lower leg with fat layer exposed: Secondary | ICD-10-CM | POA: Diagnosis not present

## 2015-12-12 DIAGNOSIS — I890 Lymphedema, not elsewhere classified: Secondary | ICD-10-CM | POA: Insufficient documentation

## 2015-12-12 DIAGNOSIS — E1151 Type 2 diabetes mellitus with diabetic peripheral angiopathy without gangrene: Secondary | ICD-10-CM | POA: Insufficient documentation

## 2015-12-12 DIAGNOSIS — Z6841 Body Mass Index (BMI) 40.0 and over, adult: Secondary | ICD-10-CM | POA: Insufficient documentation

## 2015-12-12 DIAGNOSIS — L03115 Cellulitis of right lower limb: Secondary | ICD-10-CM | POA: Diagnosis not present

## 2015-12-12 DIAGNOSIS — E669 Obesity, unspecified: Secondary | ICD-10-CM | POA: Insufficient documentation

## 2015-12-12 DIAGNOSIS — I10 Essential (primary) hypertension: Secondary | ICD-10-CM | POA: Insufficient documentation

## 2015-12-19 ENCOUNTER — Other Ambulatory Visit: Payer: Self-pay | Admitting: Internal Medicine

## 2015-12-19 ENCOUNTER — Ambulatory Visit (HOSPITAL_COMMUNITY)
Admission: RE | Admit: 2015-12-19 | Discharge: 2015-12-19 | Disposition: A | Discharge status: Home / Self Care | Payer: Managed Care, Other (non HMO) | Source: Ambulatory Visit | Attending: Vascular Surgery | Admitting: Vascular Surgery

## 2015-12-19 DIAGNOSIS — Z86718 Personal history of other venous thrombosis and embolism: Secondary | ICD-10-CM | POA: Insufficient documentation

## 2015-12-19 DIAGNOSIS — M7989 Other specified soft tissue disorders: Secondary | ICD-10-CM

## 2015-12-19 DIAGNOSIS — I87311 Chronic venous hypertension (idiopathic) with ulcer of right lower extremity: Secondary | ICD-10-CM | POA: Diagnosis not present

## 2015-12-19 DIAGNOSIS — M79661 Pain in right lower leg: Secondary | ICD-10-CM | POA: Diagnosis not present

## 2015-12-22 DIAGNOSIS — I87311 Chronic venous hypertension (idiopathic) with ulcer of right lower extremity: Secondary | ICD-10-CM | POA: Diagnosis not present

## 2015-12-26 ENCOUNTER — Encounter (HOSPITAL_COMMUNITY): Payer: Self-pay | Admitting: Emergency Medicine

## 2015-12-26 ENCOUNTER — Inpatient Hospital Stay (HOSPITAL_COMMUNITY)
Admission: EM | Admit: 2015-12-26 | Discharge: 2015-12-29 | DRG: 603 | Disposition: A | Discharge status: Home / Self Care | Payer: Managed Care, Other (non HMO) | Attending: Internal Medicine | Admitting: Internal Medicine

## 2015-12-26 ENCOUNTER — Emergency Department (HOSPITAL_COMMUNITY): Payer: Managed Care, Other (non HMO)

## 2015-12-26 DIAGNOSIS — R0602 Shortness of breath: Secondary | ICD-10-CM | POA: Diagnosis not present

## 2015-12-26 DIAGNOSIS — I890 Lymphedema, not elsewhere classified: Secondary | ICD-10-CM | POA: Diagnosis not present

## 2015-12-26 DIAGNOSIS — L97909 Non-pressure chronic ulcer of unspecified part of unspecified lower leg with unspecified severity: Secondary | ICD-10-CM | POA: Diagnosis present

## 2015-12-26 DIAGNOSIS — L03115 Cellulitis of right lower limb: Principal | ICD-10-CM | POA: Diagnosis present

## 2015-12-26 DIAGNOSIS — R739 Hyperglycemia, unspecified: Secondary | ICD-10-CM

## 2015-12-26 DIAGNOSIS — L089 Local infection of the skin and subcutaneous tissue, unspecified: Secondary | ICD-10-CM

## 2015-12-26 DIAGNOSIS — Z6841 Body Mass Index (BMI) 40.0 and over, adult: Secondary | ICD-10-CM | POA: Diagnosis present

## 2015-12-26 DIAGNOSIS — L97219 Non-pressure chronic ulcer of right calf with unspecified severity: Secondary | ICD-10-CM | POA: Diagnosis present

## 2015-12-26 DIAGNOSIS — R0609 Other forms of dyspnea: Secondary | ICD-10-CM | POA: Diagnosis present

## 2015-12-26 DIAGNOSIS — I10 Essential (primary) hypertension: Secondary | ICD-10-CM | POA: Diagnosis not present

## 2015-12-26 DIAGNOSIS — L0390 Cellulitis, unspecified: Secondary | ICD-10-CM

## 2015-12-26 DIAGNOSIS — L97812 Non-pressure chronic ulcer of other part of right lower leg with fat layer exposed: Secondary | ICD-10-CM | POA: Diagnosis not present

## 2015-12-26 DIAGNOSIS — I83009 Varicose veins of unspecified lower extremity with ulcer of unspecified site: Secondary | ICD-10-CM | POA: Diagnosis present

## 2015-12-26 DIAGNOSIS — E119 Type 2 diabetes mellitus without complications: Secondary | ICD-10-CM

## 2015-12-26 DIAGNOSIS — E11622 Type 2 diabetes mellitus with other skin ulcer: Secondary | ICD-10-CM | POA: Diagnosis present

## 2015-12-26 DIAGNOSIS — E1165 Type 2 diabetes mellitus with hyperglycemia: Secondary | ICD-10-CM | POA: Diagnosis present

## 2015-12-26 DIAGNOSIS — Z833 Family history of diabetes mellitus: Secondary | ICD-10-CM

## 2015-12-26 DIAGNOSIS — I87311 Chronic venous hypertension (idiopathic) with ulcer of right lower extremity: Secondary | ICD-10-CM | POA: Diagnosis present

## 2015-12-26 DIAGNOSIS — E1151 Type 2 diabetes mellitus with diabetic peripheral angiopathy without gangrene: Secondary | ICD-10-CM | POA: Diagnosis not present

## 2015-12-26 DIAGNOSIS — E669 Obesity, unspecified: Secondary | ICD-10-CM | POA: Diagnosis not present

## 2015-12-26 DIAGNOSIS — I83012 Varicose veins of right lower extremity with ulcer of calf: Secondary | ICD-10-CM | POA: Diagnosis present

## 2015-12-26 DIAGNOSIS — L97212 Non-pressure chronic ulcer of right calf with fat layer exposed: Secondary | ICD-10-CM | POA: Diagnosis not present

## 2015-12-26 DIAGNOSIS — T148XXA Other injury of unspecified body region, initial encounter: Secondary | ICD-10-CM

## 2015-12-26 DIAGNOSIS — G4730 Sleep apnea, unspecified: Secondary | ICD-10-CM | POA: Diagnosis present

## 2015-12-26 LAB — CBC WITH DIFFERENTIAL/PLATELET
BASOS ABS: 0 10*3/uL (ref 0.0–0.1)
BASOS PCT: 0 %
EOS ABS: 0.3 10*3/uL (ref 0.0–0.7)
Eosinophils Relative: 3 %
HEMATOCRIT: 36.1 % — AB (ref 39.0–52.0)
HEMOGLOBIN: 11.6 g/dL — AB (ref 13.0–17.0)
Lymphocytes Relative: 13 %
Lymphs Abs: 1 10*3/uL (ref 0.7–4.0)
MCH: 24.7 pg — ABNORMAL LOW (ref 26.0–34.0)
MCHC: 32.1 g/dL (ref 30.0–36.0)
MCV: 77 fL — ABNORMAL LOW (ref 78.0–100.0)
MONO ABS: 0.6 10*3/uL (ref 0.1–1.0)
MONOS PCT: 8 %
NEUTROS ABS: 5.8 10*3/uL (ref 1.7–7.7)
NEUTROS PCT: 76 %
Platelets: 240 10*3/uL (ref 150–400)
RBC: 4.69 MIL/uL (ref 4.22–5.81)
RDW: 16.4 % — AB (ref 11.5–15.5)
WBC: 7.7 10*3/uL (ref 4.0–10.5)

## 2015-12-26 LAB — BASIC METABOLIC PANEL
ANION GAP: 7 (ref 5–15)
BUN: 11 mg/dL (ref 6–20)
CALCIUM: 8.9 mg/dL (ref 8.9–10.3)
CO2: 29 mmol/L (ref 22–32)
CREATININE: 0.89 mg/dL (ref 0.61–1.24)
Chloride: 102 mmol/L (ref 101–111)
Glucose, Bld: 121 mg/dL — ABNORMAL HIGH (ref 65–99)
Potassium: 3.4 mmol/L — ABNORMAL LOW (ref 3.5–5.1)
SODIUM: 138 mmol/L (ref 135–145)

## 2015-12-26 LAB — I-STAT CG4 LACTIC ACID, ED
LACTIC ACID, VENOUS: 1.51 mmol/L (ref 0.5–1.9)
LACTIC ACID, VENOUS: 0.75 mmol/L (ref 0.5–1.9)

## 2015-12-26 LAB — PROTIME-INR
INR: 1.14
Prothrombin Time: 14.7 seconds (ref 11.4–15.2)

## 2015-12-26 LAB — BRAIN NATRIURETIC PEPTIDE: B NATRIURETIC PEPTIDE 5: 132.2 pg/mL — AB (ref 0.0–100.0)

## 2015-12-26 LAB — APTT: APTT: 24 s (ref 24–36)

## 2015-12-26 LAB — SEDIMENTATION RATE: Sed Rate: 30 mm/hr — ABNORMAL HIGH (ref 0–16)

## 2015-12-26 MED ORDER — ENOXAPARIN SODIUM 40 MG/0.4ML ~~LOC~~ SOLN: 40.0000 mg | SUBCUTANEOUS | Status: DC

## 2015-12-26 MED ORDER — ENOXAPARIN SODIUM 120 MG/0.8ML ~~LOC~~ SOLN
120.0000 mg | Freq: Every day | SUBCUTANEOUS | Status: DC
  Administered 2015-12-26 – 2015-12-28 (×3): 120 mg via SUBCUTANEOUS
  Filled 2015-12-26 (×4): qty 0.8

## 2015-12-26 MED ORDER — LISINOPRIL 20 MG PO TABS
20.0000 mg | ORAL_TABLET | Freq: Every day | ORAL | Status: DC
  Administered 2015-12-27 – 2015-12-29 (×3): 20 mg via ORAL
  Filled 2015-12-26 (×3): qty 1

## 2015-12-26 MED ORDER — LISINOPRIL 20 MG PO TABS
20.0000 mg | ORAL_TABLET | Freq: Once | ORAL | Status: AC
  Administered 2015-12-26: 20 mg via ORAL
  Filled 2015-12-26: qty 1

## 2015-12-26 MED ORDER — POTASSIUM CHLORIDE CRYS ER 20 MEQ PO TBCR
40.0000 meq | EXTENDED_RELEASE_TABLET | Freq: Once | ORAL | Status: AC
  Administered 2015-12-26: 40 meq via ORAL
  Filled 2015-12-26: qty 2

## 2015-12-26 MED ORDER — SODIUM CHLORIDE 0.9 % IV SOLN
Freq: Once | INTRAVENOUS | Status: AC
  Administered 2015-12-26: 20:00:00 via INTRAVENOUS

## 2015-12-26 MED ORDER — HYDRALAZINE HCL 20 MG/ML IJ SOLN
20.0000 mg | Freq: Four times a day (QID) | INTRAMUSCULAR | Status: DC | PRN
  Administered 2015-12-27 – 2015-12-29 (×5): 20 mg via INTRAVENOUS
  Filled 2015-12-26 (×5): qty 1

## 2015-12-26 MED ORDER — ONDANSETRON HCL 4 MG PO TABS: 4.0000 mg | ORAL_TABLET | Freq: Four times a day (QID) | ORAL | Status: DC | PRN

## 2015-12-26 MED ORDER — ONDANSETRON HCL 4 MG/2ML IJ SOLN
4.0000 mg | Freq: Four times a day (QID) | INTRAMUSCULAR | Status: DC | PRN
  Administered 2015-12-27: 4 mg via INTRAVENOUS
  Filled 2015-12-26: qty 2

## 2015-12-26 MED ORDER — PIPERACILLIN-TAZOBACTAM 3.375 G IVPB
3.3750 g | Freq: Three times a day (TID) | INTRAVENOUS | Status: DC
  Administered 2015-12-27 – 2015-12-29 (×7): 3.375 g via INTRAVENOUS
  Filled 2015-12-26 (×8): qty 50

## 2015-12-26 MED ORDER — ACETAMINOPHEN 325 MG PO TABS
650.0000 mg | ORAL_TABLET | Freq: Four times a day (QID) | ORAL | Status: DC | PRN
Start: 2015-12-26 — End: 2015-12-29
  Administered 2015-12-27 – 2015-12-29 (×4): 650 mg via ORAL
  Filled 2015-12-26 (×4): qty 2

## 2015-12-26 MED ORDER — TRAMADOL HCL 50 MG PO TABS
50.0000 mg | ORAL_TABLET | Freq: Four times a day (QID) | ORAL | Status: DC | PRN
  Administered 2015-12-26 – 2015-12-27 (×3): 50 mg via ORAL
  Filled 2015-12-26 (×3): qty 1

## 2015-12-26 MED ORDER — VANCOMYCIN HCL IN DEXTROSE 1-5 GM/200ML-% IV SOLN: 1000.0000 mg | Freq: Once | INTRAVENOUS | Status: DC

## 2015-12-26 MED ORDER — VANCOMYCIN HCL 10 G IV SOLR
2500.0000 mg | INTRAVENOUS | Status: AC
  Administered 2015-12-26: 2500 mg via INTRAVENOUS
  Filled 2015-12-26: qty 500

## 2015-12-26 MED ORDER — PIPERACILLIN-TAZOBACTAM 3.375 G IVPB 30 MIN
3.3750 g | INTRAVENOUS | Status: AC
  Administered 2015-12-26: 3.375 g via INTRAVENOUS
  Filled 2015-12-26: qty 50

## 2015-12-26 MED ORDER — VANCOMYCIN HCL 10 G IV SOLR
1500.0000 mg | Freq: Two times a day (BID) | INTRAVENOUS | Status: DC
  Administered 2015-12-27 – 2015-12-28 (×4): 1500 mg via INTRAVENOUS
  Filled 2015-12-26 (×5): qty 1500

## 2015-12-26 MED ORDER — ACETAMINOPHEN 650 MG RE SUPP: 650.0000 mg | Freq: Four times a day (QID) | RECTAL | Status: DC | PRN

## 2015-12-26 NOTE — Progress Notes (Signed)
EKG shows nonspecific T wave inversions in lateral leads.  NSR without acute ST segment changes.  Right tib fib xray does not show definitive evidence of osteomyelitis.  Chest xray shows cardiomegaly and shallow lung inflation.  No visible consolidation.

## 2015-12-26 NOTE — ED Notes (Signed)
MD at bedside. 

## 2015-12-26 NOTE — Progress Notes (Addendum)
Pharmacy Antibiotic Note  [name] is a 35 y.o. male admitted on 12/26/2015 with worsening right calf ulcer.  Has been on amoxicillin and cipro but sent to hospital from wound care center for IV antibiotics Pharmacy has been consulted for Zosyn & Vancomycin dosing.  Plan: Zosyn 3.375g IV q8h (4 hour infusion).  Vancomycin 2500mg  IV x 1 loading dose followed by 1500mg  IV q12h  - Vancomycin trough goal: 10-15 mcg/ml  Height: 6' (182.9 cm) Weight: (!) 522 lb (236.8 kg) IBW/kg (Calculated) : 77.6  Temp (24hrs), Avg:98.9 F (37.2 C), Min:98.9 F (37.2 C), Max:98.9 F (37.2 C)   Recent Labs Lab 12/26/15 1611 12/26/15 1639 12/26/15 1902  WBC 7.7  --   --   CREATININE 0.89  --   --   LATICACIDVEN  --  1.51 0.75    Estimated Creatinine Clearance: 231.5 mL/min (by C-G formula based on SCr of 0.89 mg/dL).    Allergies  Allergen Reactions  . Lidocaine Other (See Comments)    "burns my skin"  . Peanut-Containing Drug Products Swelling    Antimicrobials this admission: 10/17 Zosyn >>   10/17 Vanc >>  Dose adjustments this admission:    Microbiology results: 10/17 Blood WU:[id number]:sent  Thank you for allowing pharmacy to be a part of this patient's care.  Maryellen PilePoindexter, Paetyn Pietrzak Trefz, PharmD 12/26/2015 7:21 PM

## 2015-12-26 NOTE — ED Provider Notes (Signed)
[facility]-EMER[facility]ENCY DEPT Provider Note   CSN: [id number] Arrival date & time: 12/26/15  1455     History   Chief Complaint Chief Complaint  Patient presents with  . Wound Infection    HPI [name] is a 35 y.o. male.  HPI 35 year old male with a history of chronic lower extremity lymphedema complicated by recurrent venous stasis ulcers who presents from wound clinic for failed outpatient management of chronic leg wound. Patient is been placed on Cipro for 1 week and switched to Augmentin last week. Seen in clinic today with no improvement in the wound. Patient denies any fevers, chills, nausea, vomiting. Swelling is at baseline per patient. He has had some serosanguineous discharge. Also noted a bluish green hue surrounding the wound for the past week.  Patient had an ultrasound last week and ruled out DVT in the right lower extremity.  Currently the patient has no other physical complaints. Past Medical History:  Diagnosis Date  . Obesity     Patient Active Problem List   Diagnosis Date Noted  . Cellulitis 12/26/2015  . Chronic acquired lymphedema 12/26/2015  . Venous stasis ulcer (HCC) 01/09/2011  . Hypertension 01/09/2011  . Obesity 01/09/2011    Past Surgical History:  Procedure Laterality Date  . balloon stenting Right    right leg  . VEIN LIGATION AND STRIPPING         Home Medications    Prior to Admission medications   Medication Sig Start Date End Date Taking? Authorizing Provider  amoxicillin-clavulanate (AUGMENTIN) 875-125 MG tablet Take 1 tablet by mouth 2 (two) times daily.  12/19/15  Yes Historical Provider, MD  lisinopril-hydrochlorothiazide (PRINZIDE,ZESTORETIC) 20-25 MG tablet Take 1 tablet by mouth daily.  12/21/15  Yes Historical Provider, MD  traMADol (ULTRAM) 50 MG tablet Take 50 mg by mouth every 6 (six) hours as needed for moderate pain or severe pain.  12/17/15  Yes Historical Provider, MD    Family History Family History  Problem  Relation Age of Onset  . Diabetes Mother   . Cancer Father     colon  . Cancer Maternal Grandmother   . Cancer Maternal Grandfather     Social History Social History  Substance Use Topics  . Smoking status: Never Smoker  . Smokeless tobacco: Never Used  . Alcohol use 2.0 oz/week    4 Standard drinks or equivalent per week     Comment: liquor - pt states once a week     Allergies   Lidocaine and Peanut-containing drug products   Review of Systems Review of Systems  Constitutional: Negative for chills and fever.  HENT: Negative for ear pain and sore throat.   Eyes: Negative for pain and visual disturbance.  Respiratory: Negative for cough and shortness of breath.   Cardiovascular: Negative for chest pain and palpitations.  Gastrointestinal: Negative for abdominal pain and vomiting.  Genitourinary: Negative for dysuria and hematuria.  Musculoskeletal: Negative for arthralgias and back pain.  Skin: Positive for wound. Negative for color change and rash.  Neurological: Negative for seizures and syncope.  All other systems reviewed and are negative.    Physical Exam Updated Vital Signs BP (!) 160/106   Pulse 96   Temp 98.9 F (37.2 C) (Oral)   Resp 22   Ht 6' (1.829 m)   Wt (!) 522 lb (236.8 kg)   SpO2 99%   BMI 70.80 kg/m   Physical Exam  Constitutional: He is oriented to person, place, and time. He appears  well-developed and well-nourished. No distress.  HENT:  Head: Normocephalic and atraumatic.  Nose: Nose normal.  Eyes: Conjunctivae and EOM are normal. Pupils are equal, round, and reactive to light. Right eye exhibits no discharge. Left eye exhibits no discharge. No scleral icterus.  Neck: Normal range of motion. Neck supple.  Cardiovascular: Normal rate and regular rhythm.  Exam reveals no gallop and no friction rub.   No murmur heard. Pulmonary/Chest: Effort normal and breath sounds normal. No stridor. No respiratory distress. He has no rales.    Abdominal: Soft. He exhibits no distension. There is no tenderness.  Musculoskeletal: He exhibits no edema or tenderness.  Neurological: He is alert and oriented to person, place, and time.  Skin: Skin is warm and dry. No rash noted. He is not diaphoretic. No erythema.     Psychiatric: He has a normal mood and affect.  Vitals reviewed.    ED Treatments / Results  Labs (all labs ordered are listed, but only abnormal results are displayed) Labs Reviewed  CBC WITH DIFFERENTIAL/PLATELET - Abnormal; Notable for the following:       Result Value   Hemoglobin 11.6 (*)    HCT 36.1 (*)    MCV 77.0 (*)    MCH 24.7 (*)    RDW 16.4 (*)    All other components within normal limits  BASIC METABOLIC PANEL - Abnormal; Notable for the following:    Potassium 3.4 (*)    Glucose, Bld 121 (*)    All other components within normal limits  I-STAT CG4 LACTIC ACID, ED  I-STAT CG4 LACTIC ACID, ED    EKG  EKG Interpretation None       Radiology No results found.  Procedures Procedures (including critical care time)  Medications Ordered in ED Medications  piperacillin-tazobactam (ZOSYN) IVPB 3.375 g (not administered)  lisinopril (PRINIVIL,ZESTRIL) tablet 20 mg (not administered)  piperacillin-tazobactam (ZOSYN) IVPB 3.375 g (0 g Intravenous Stopped 12/26/15 2025)  0.9 %  sodium chloride infusion ( Intravenous New Bag/Given 12/26/15 1939)     Initial Impression / Assessment and Plan / ED Course  I have reviewed the triage vital signs and the nursing notes.  Pertinent labs & imaging results that were available during my care of the patient were reviewed by me and considered in my medical decision making (see chart for details).  Clinical Course    Chronic right lower extremity leg wound with failed outpatient management by wound care sent here for IV antibiotics. No significant tenderness to palpation or crepitus, plain film without evidence of subcutaneous air; low suspicion for  necrotizing fasciitis. Not able to probe down to the bone, low suspicion for possible myelitis. Patient does have bluish greenish hue surrounding the wound concerning for pseudomonal infection. Labs without evidence of sepsis.Started on Zosyn for pseudomonal coverage. Discussed case with surgery who did not see a need for emergent debridement. Also recommended IV antibiotics and wound care. Appreciate hospitalist admission for continued management.  Final Clinical Impressions(s) / ED Diagnoses   Final diagnoses:  Shortness of breath  Wound infection    Disposition: Admit  Condition: Stable    Nira Conn, MD 12/26/15 2036

## 2015-12-26 NOTE — ED Notes (Signed)
Hospitalist at bedside 

## 2015-12-26 NOTE — Progress Notes (Signed)
Rx Brief note:  Lovenox  Wt=239 kg, BMI=65, CrCl >100 ml/min  Rx adjusted Lovenox to 120mg  daily for DVT prophylaxis in pt with BMI=65.  Thanks Lorenza EvangelistGreen, Jazzma Neidhardt R 12/26/2015 9:43 PM

## 2015-12-26 NOTE — H&P (Signed)
History and Physical    PHU RECORD ONG:[id number] DOB: 1981/01/18 DOA: 12/26/2015  PCP: Thora Lance, MD   Patient coming from: Wound care clinic  Chief Complaint: Increased drainage from nonhealing leg ulcer, failed outpatient antibiotics  HPI: [name] is a 35 y.o. gentleman with history of morbid obesity, HTN, and chronic lymphedema who presents to the ED at [facility] upon referral from his outpatient wound care center for admission for IV antibiotics.  The patient has a history of recurrent ulcers in the right leg secondary to poor circulation and chronic lymphedema attributed to procedures that he had as a premature newborn for parenteral nutrition support.  Presently, he has had a progressive area of ulceration, involving at least half of the circumference of his right calf, that has not healed with provided therapies over the past 4-5 months.  He has been receiving wound care in clinic on Tuesdays and his mother performs dressing changes as directed 1-2 times during the week (frequency based on the amount of drainage from the wound).  Over the past month, he has increased, malodorous, green drainage from the wound with increased pain in his right leg.  He has not had associated fevers, chills, sweats, nausea, vomiting, or diarrhea.  He was sent for ultrasound of his right leg one week ago by his outpatient provider to rule out DVT.  Per report in Epic, no DVT was identified.  He was treated with a one week course of cipro approximately three weeks ago.  The wound odor improved but the amount of drainage actually seems to be worse, per the patient and his mother.  He is currently on oral Augmentin.  The wound was evaluated per routine in clinic today, and the patient was referred for further evaluation and IV antibiotic therapy.  He reports a history of pseudomonas and staph infections.  Last hospitalization for leg infection was 2009.  ED Course: Right lower extremity x ray pending.   Basic labs show normal WBC count, Hgb of 11.6, normal platelet count, normal BUN and creatinine.  Potassium 3.2.  IV Zosyn ordered for concerns of pseudomonas infection.  Review of Systems: Intermittent abdominal pain but he denies associated symptoms.  Appetite is normal.  Denies symptoms of acid reflux (no longer on ranitidine).  Progressive edema in his left leg for the past few months (previously isolated to the right leg).  Dyspnea on exertion for the past two months, but no light-headedness, chest pain, or near-syncope.  Otherwise, 10 systems reviewed and negative except as stated in the HPI.    Past Medical History:  Diagnosis Date  . Obesity   HTN Chronic Lymphedema Premature at birth  PAST SURGICAL HISTORY: "Cut down" procedure at birth for parenteral nutrition support.  Multiple procedures since then in [place] and [place] to attempt to improve circulation in the right leg.   reports that he has never smoked. He has never used smokeless tobacco. He reports that he drinks about 2.0 oz of alcohol per week . He reports that he does not use drugs.  He is not married.  He does not have children.  Allergies  Allergen Reactions  . Lidocaine Other (See Comments)    "burns my skin"  . Peanut-Containing Drug Products Swelling    Family History  Problem Relation Age of Onset  . Diabetes Mother   . Cancer Father     colon  . Cancer Maternal Grandmother   . Cancer Maternal Grandfather      Prior  to Admission medications   Medication Sig Start Date End Date Taking? Authorizing Provider  amoxicillin-clavulanate (AUGMENTIN) 875-125 MG tablet Take 1 tablet by mouth 2 (two) times daily.  12/19/15  Yes Historical Provider, MD  lisinopril-hydrochlorothiazide (PRINZIDE,ZESTORETIC) 20-25 MG tablet Take 1 tablet by mouth daily.  12/21/15  Yes Historical Provider, MD  traMADol (ULTRAM) 50 MG tablet Take 50 mg by mouth every 6 (six) hours as needed for moderate pain or severe pain.   12/17/15  Yes Historical Provider, MD    Physical Exam: Vitals:   12/26/15 1533 12/26/15 1537 12/26/15 1855  BP: (!) 168/110  (!) 160/106  Pulse: 101  96  Resp: 20  22  Temp: 98.9 F (37.2 C)    TempSrc: Oral    SpO2: 96%  99%  Weight:  (!) 236.8 kg (522 lb)   Height:  6' (1.829 m)       Constitutional: NAD, calm, comfortable, NONtoxic appearing Vitals:   12/26/15 1533 12/26/15 1537 12/26/15 1855  BP: (!) 168/110  (!) 160/106  Pulse: 101  96  Resp: 20  22  Temp: 98.9 F (37.2 C)    TempSrc: Oral    SpO2: 96%  99%  Weight:  (!) 236.8 kg (522 lb)   Height:  6' (1.829 m)    Eyes: pupils appear equal, lids and conjunctivae normal ENMT: Mucous membranes are moist. Normal dentition.  Neck: normal appearance, supple, thick Respiratory: clear to auscultation bilaterally, no wheezing, no crackles. Normal respiratory effort. No accessory muscle use.  Cardiovascular: Normal rate, regular rhythm, no murmurs / rubs / gallops. 2-3+ pitting edema in bilateral lower extremities. 2+ pedal pulses.  GI: abdomen is super obese but compressible.  No distention.  No tenderness.  Bowel sounds are present. Musculoskeletal:  No joint deformity in upper and lower extremities. ROM only reduced by body habitus.  No contractures. Normal muscle tone.  Skin: Skin in the right leg is hyperpigmented with lichenification compared to the left.  There is evidence of a healing ulceration to the lateral aspect of his right calf.  However, there is a larger area of ucleration (at least 5-6 cm at greatest width) that involves at least half the circumference of his right leg/calf).  It appears chronic in nature, fairly deep, but no muscle or bone appears to be exposed.  No significant drainage for me but edges of the wound appear friable.  With chronic skin changes and chronic edema, no apparent induration or fluctuance surrounding the wound. Neurologic: No focal deficits. Psychiatric: Normal judgment and insight.  Alert and oriented x 3. Normal mood.     Labs on Admission: I have personally reviewed following labs and imaging studies  CBC:  Recent Labs Lab 12/26/15 1611  WBC 7.7  NEUTROABS 5.8  HGB 11.6*  HCT 36.1*  MCV 77.0*  PLT 240   Basic Metabolic Panel:  Recent Labs Lab 12/26/15 1611  NA 138  K 3.4*  CL 102  CO2 29  GLUCOSE 121*  BUN 11  CREATININE 0.89  CALCIUM 8.9   GFR: Estimated Creatinine Clearance: 231.5 mL/min (by C-G formula based on SCr of 0.89 mg/dL).  Sepsis Labs:  Lactic acid level 1.5, 0.75  Radiological Exams on Admission: Right leg xray pending  Portable chest xray pending  EKG: Pending  Assessment/Plan Principal Problem:   Cellulitis Active Problems:   Venous stasis ulcer (HCC)   Hypertension   Obesity   Chronic acquired lymphedema      Chronic nonhealing ulcer to  the right leg, present on admission, with concerns for acute superimposed cellulitis without evidence of sepsis. --Empiric coverage for pseudomonas and MRSA at this point with IV zosyn and vancomycin --Wound care consult; if more than a bedside debridement is warranted, he will need general surgery consultation --Blood cultures now --Wound culture per wound care consultant or intraoperatively --Check sed rate and CRP --Will need to follow-up xray of right leg; he has had a recent ultrasound.  Additional imaging deferred for now.  HTN --Continue home dose of lisinopril --Hold HCTZ for now --IV hydralazine PRN with parameters  Dyspnea on exertion --Check chest xray, EKG, BNP --Continuous pulse oximetetry   DVT prophylaxis: Lovenox Code Status: FULL Family Communication: Mother and family friend present in the ED at time of admission Disposition Plan: To be determined Consults called: None.  If General Surgery is needed, formal consultation will need to be placed in the AM. Admission status: Observation, med surg   TIME SPENT: 60 minutes   Jerene Bearsarter,Esperansa Sarabia Harrison  MD [facility] Pager [phone]  If 7PM-7AM, please contact night-coverage [email or web address] Password [email or web address]  12/26/2015, 8:15 PM

## 2015-12-26 NOTE — ED Triage Notes (Addendum)
Patient present from wound care center with an ulcer on the back of his right calf that they have been treated for 4-5 months, worsening over past 2 weeks.  Patient has taken Amoxicillin and Cipro, but was sent here for IV antibiotics.  The wound is wrapped and cannot be visualized in triage.  Patient denies being diabetic.  He rates pain as 5/10 currently.  Patient denies N/V/D and fever.

## 2015-12-27 DIAGNOSIS — L03115 Cellulitis of right lower limb: Principal | ICD-10-CM

## 2015-12-27 DIAGNOSIS — L089 Local infection of the skin and subcutaneous tissue, unspecified: Secondary | ICD-10-CM | POA: Diagnosis not present

## 2015-12-27 DIAGNOSIS — I890 Lymphedema, not elsewhere classified: Secondary | ICD-10-CM

## 2015-12-27 DIAGNOSIS — G4730 Sleep apnea, unspecified: Secondary | ICD-10-CM | POA: Diagnosis present

## 2015-12-27 DIAGNOSIS — I10 Essential (primary) hypertension: Secondary | ICD-10-CM | POA: Diagnosis present

## 2015-12-27 DIAGNOSIS — R0609 Other forms of dyspnea: Secondary | ICD-10-CM | POA: Diagnosis present

## 2015-12-27 DIAGNOSIS — Z6841 Body Mass Index (BMI) 40.0 and over, adult: Secondary | ICD-10-CM

## 2015-12-27 DIAGNOSIS — E669 Obesity, unspecified: Secondary | ICD-10-CM

## 2015-12-27 DIAGNOSIS — E11622 Type 2 diabetes mellitus with other skin ulcer: Secondary | ICD-10-CM | POA: Diagnosis present

## 2015-12-27 DIAGNOSIS — I83012 Varicose veins of right lower extremity with ulcer of calf: Secondary | ICD-10-CM | POA: Diagnosis present

## 2015-12-27 DIAGNOSIS — R739 Hyperglycemia, unspecified: Secondary | ICD-10-CM | POA: Diagnosis not present

## 2015-12-27 DIAGNOSIS — E1165 Type 2 diabetes mellitus with hyperglycemia: Secondary | ICD-10-CM | POA: Diagnosis present

## 2015-12-27 DIAGNOSIS — R0602 Shortness of breath: Secondary | ICD-10-CM | POA: Diagnosis present

## 2015-12-27 DIAGNOSIS — Z833 Family history of diabetes mellitus: Secondary | ICD-10-CM | POA: Diagnosis not present

## 2015-12-27 DIAGNOSIS — L97212 Non-pressure chronic ulcer of right calf with fat layer exposed: Secondary | ICD-10-CM

## 2015-12-27 DIAGNOSIS — L97219 Non-pressure chronic ulcer of right calf with unspecified severity: Secondary | ICD-10-CM | POA: Diagnosis present

## 2015-12-27 DIAGNOSIS — T148XXA Other injury of unspecified body region, initial encounter: Secondary | ICD-10-CM | POA: Diagnosis not present

## 2015-12-27 LAB — MRSA PCR SCREENING: MRSA BY PCR: NEGATIVE

## 2015-12-27 LAB — COMPREHENSIVE METABOLIC PANEL
ALBUMIN: 3.6 g/dL (ref 3.5–5.0)
ALK PHOS: 45 U/L (ref 38–126)
ALT: 18 U/L (ref 17–63)
AST: 19 U/L (ref 15–41)
Anion gap: 6 (ref 5–15)
BILIRUBIN TOTAL: 0.8 mg/dL (ref 0.3–1.2)
BUN: 13 mg/dL (ref 6–20)
CALCIUM: 8.8 mg/dL — AB (ref 8.9–10.3)
CO2: 27 mmol/L (ref 22–32)
CREATININE: 0.85 mg/dL (ref 0.61–1.24)
Chloride: 105 mmol/L (ref 101–111)
GFR calc Af Amer: >60 mL/min (ref >60)
GLUCOSE: 134 mg/dL — AB (ref 65–99)
Potassium: 3.7 mmol/L (ref 3.5–5.1)
Sodium: 138 mmol/L (ref 135–145)
TOTAL PROTEIN: 7.4 g/dL (ref 6.5–8.1)

## 2015-12-27 LAB — CBC
HEMATOCRIT: 34.8 % — AB (ref 39.0–52.0)
Hemoglobin: 10.8 g/dL — ABNORMAL LOW (ref 13.0–17.0)
MCH: 24.5 pg — ABNORMAL LOW (ref 26.0–34.0)
MCHC: 31 g/dL (ref 30.0–36.0)
MCV: 78.9 fL (ref 78.0–100.0)
PLATELETS: 224 10*3/uL (ref 150–400)
RBC: 4.41 MIL/uL (ref 4.22–5.81)
RDW: 16.8 % — AB (ref 11.5–15.5)
WBC: 8.3 10*3/uL (ref 4.0–10.5)

## 2015-12-27 LAB — C-REACTIVE PROTEIN: CRP: 7.3 mg/dL — AB (ref <1.0)

## 2015-12-27 NOTE — Progress Notes (Signed)
[facility] PROGRESS NOTE  [name] VXY:[id number] DOB: 09/29/1980 DOA: 12/26/2015  PCP: Simona Huh, MD  Brief History/Interval Summary: 35 year old African-American male with morbid obesity, chronic lymphedema, hypertension, was referred to the emergency department by his outpatient wound care provider for worsening right lower extremity calf ulcer with infection. Patient was hospitalized and placed on intravenous antibiotics for further management.  Reason for Visit: Acute wound infection  Consultants: Wound care  Procedures: None  Antibiotics: Vancomycin and Zosyn 10/17  Subjective/Interval History: Patient continues to have pain in his right leg. Complains of some nausea as well but no vomiting. No fever, no chills.  ROS: Denies any chest pain, shortness of breath.  Objective:  Vital Signs  Vitals:   12/26/15 2034 12/26/15 2122 12/27/15 0542 12/27/15 0928  BP: (!) 160/103 (!) 161/108 (!) 159/109 (!) 181/95  Pulse: 94 (!) 192 (!) 102 (!) 111  Resp: 20 (!) 22 (!) 22 (!) 25  Temp:  97.8 F (36.6 C) 97.9 F (36.6 C) 98.6 F (37 C)  TempSrc:  Oral Oral Oral  SpO2: 98% 98% 95%   Weight:  (!) 239 kg (527 lb)    Height:  6' 3"  (1.905 m)      Intake/Output Summary (Last 24 hours) at 12/27/15 1115 Last data filed at 12/27/15 1000  Gross per 24 hour  Intake              150 ml  Output              450 ml  Net             -300 ml   Filed Weights   12/26/15 1537 12/26/15 2122  Weight: (!) 236.8 kg (522 lb) (!) 239 kg (527 lb)    General appearance: alert, cooperative, appears stated age, no distress and morbidly obese Resp: clear to auscultation bilaterally Cardio: regular rate and rhythm, S1, S2 normal, no murmur, click, rub or gallop GI: soft, non-tender; bowel sounds normal; no masses,  no organomegaly Extremities: Right lower extremity is covered in a dressing. Edema is noted. Pulses: Poorly palpable in the lower extremities due to  edema Neurologic: Awake and alert. Oriented 3. No focal neurological deficits.  Lab Results:  Data Reviewed: I have personally reviewed following labs and imaging studies  CBC:  Recent Labs Lab 12/26/15 1611 12/27/15 0441  WBC 7.7 8.3  NEUTROABS 5.8  --   HGB 11.6* 10.8*  HCT 36.1* 34.8*  MCV 77.0* 78.9  PLT 240 827    Basic Metabolic Panel:  Recent Labs Lab 12/26/15 1611 12/27/15 0441  NA 138 138  K 3.4* 3.7  CL 102 105  CO2 29 27  GLUCOSE 121* 134*  BUN 11 13  CREATININE 0.89 0.85  CALCIUM 8.9 8.8*    GFR: Estimated Creatinine Clearance: 251 mL/min (by C-G formula based on SCr of 0.85 mg/dL).  Liver Function Tests:  Recent Labs Lab 12/27/15 0441  AST 19  ALT 18  ALKPHOS 45  BILITOT 0.8  PROT 7.4  ALBUMIN 3.6    Coagulation Profile:  Recent Labs Lab 12/26/15 2135  INR 1.14      Recent Results (from the past 240 hour(s))  MRSA PCR Screening     Status: None   Collection Time: 12/27/15  3:14 AM  Result Value Ref Range Status   MRSA by PCR NEGATIVE NEGATIVE Final    Comment:        The GeneXpert MRSA Assay (FDA approved for  NASAL specimens only), is one component of a comprehensive MRSA colonization surveillance program. It is not intended to diagnose MRSA infection nor to guide or monitor treatment for MRSA infections.       Radiology Studies: Dg Chest 1 View  Result Date: 12/26/2015 CLINICAL DATA:  Shortness of breath EXAM: CHEST 1 VIEW COMPARISON:  Chest radiographs 08/28/2007 FINDINGS: Examination is degraded by underpenetration. The cardiac silhouette is enlarged. No focal airspace consolidation is visualized. There is shallow lung inflation without overt pulmonary edema. IMPRESSION: Cardiomegaly and shallow lung inflation.  No visible consolidation. Electronically Signed   By: Ulyses Jarred M.D.   On: 12/26/2015 20:50   Dg Tibia/fibula Right  Result Date: 12/26/2015 CLINICAL DATA:  Ulceration of right calf. EXAM: RIGHT  TIBIA AND FIBULA - 2 VIEW COMPARISON:  None. FINDINGS: There is no evidence of fracture or other focal bone lesions. Surgical staples are noted posteriorly in the distal calf. Small diffuse calcifications are seen throughout the soft tissues suggesting dermatomyositis or old injury. Large skin ulceration is seen posteriorly in the calf. No lytic destruction is seen to suggest osteomyelitis. IMPRESSION: Large skin ulceration seen posteriorly in the distal calf. No definite evidence of osteomyelitis. Electronically Signed   By: Marijo Conception, M.D.   On: 12/26/2015 20:48     Medications:  Scheduled: . enoxaparin (LOVENOX) injection  120 mg Subcutaneous QHS  . lisinopril  20 mg Oral Daily  . piperacillin-tazobactam (ZOSYN)  IV  3.375 g Intravenous Q8H  . vancomycin  1,500 mg Intravenous Q12H   Continuous:  RXY:[id number] **OR** acetaminophen, hydrALAZINE, ondansetron **OR** ondansetron (ZOFRAN) IV, traMADol  Assessment/Plan:  Principal Problem:   Cellulitis Active Problems:   Venous stasis ulcer (HCC)   Hypertension   Obesity   Chronic acquired lymphedema    Chronic nonhealing ulcer to the right leg with acute cellulitis complicated by his chronic lymphedema. Wound care nurse has seen the patient. Discussed with her. Unna boot has been recommended. There is no need for surgical debridement at this time. Continue with the vancomycin and Zosyn for now. Follow-up on cultures. No osteomyelitis detected on x-ray. ESR is 30. Had a recent negative ultrasound for DVT.  History of essential hypertension  Continue home dose of lisinopril. Holding HCTZ for now. IV hydralazine PRN with parameters.  Dyspnea on exertion Saturating normally on room air. Chest x-ray does not show any acute findings. Lungs are clear to auscultation. BNP is only 132. Could be related to his obesity. Continue to monitor closely.  Morbid obesity Body mass index is 65.87 kg/m.   DVT Prophylaxis: Lovenox     Code Status: Full code  Family Communication: Discussed with the patient and sister  Disposition Plan: Continue management as outlined above. Mobilize as tolerated.    LOS: 0 days   [facility] Pager [phone] 12/27/2015, 11:15 AM  If 7PM-7AM, please contact night-coverage at [email or web address], password [facility]

## 2015-12-27 NOTE — Progress Notes (Signed)
Wound dressing changed per order. Removed ABD's from previous dressing. Wound care to right lower leg. Filled with silver hydrofiber (Aquacel) and covered with ABD's along with coban.

## 2015-12-27 NOTE — Consult Note (Signed)
WOC Nurse wound consult note Reason for Consult:Chronic right LE wounds in patient with lymphedema followed by the outpatient wound center (Dr,. Leanord Hawkingobson).  Was seen yesterday and referred for admission.  Medial wound is > lateral.  Patient will bring lymphedema pumps to hospital. Wound type:Venous insufficiency Pressure Ulcer POA: No Measurement:lateral 3cm x 2cm x 0.1cm, ruddy red base, irregular wound edge. Medial wound with 30% yellow slough, 70% red base, 6cm x 18cm x 1.5cm Wound bed:As described above Drainage (amount, consistency, odor) Moderate amount of serosanguinous exudate Periwound:macerated Dressing procedure/placement/frequency:We will continue the POC established by Dr. Leanord HawkingObson and fill/cover the wounds with silver hydrofiber, then apply an Unna's Boot.  We will change three times weekly because patient has an active infection rather than the twice weekly (or once weekly) typically ordered.  Discussed POC with Dr. Rito EhrlichKrishnan and he is in agreement with POC.  [facility] W[facility] nursing team will not follow, but will remain available to this patient, the nursing and medical teams.  Please re-consult if needed. Thanks, Ladona MowLaurie Wilbert Hayashi, MSN, RN, GNP, Hans EdenCWOCN, CWON-AP, FAAN  Pager# [phone]

## 2015-12-28 DIAGNOSIS — L089 Local infection of the skin and subcutaneous tissue, unspecified: Secondary | ICD-10-CM

## 2015-12-28 DIAGNOSIS — R739 Hyperglycemia, unspecified: Secondary | ICD-10-CM

## 2015-12-28 DIAGNOSIS — T148XXA Other injury of unspecified body region, initial encounter: Secondary | ICD-10-CM

## 2015-12-28 DIAGNOSIS — I10 Essential (primary) hypertension: Secondary | ICD-10-CM

## 2015-12-28 LAB — HEMOGLOBIN A1C
Hgb A1c MFr Bld: 6.6 % — ABNORMAL HIGH (ref 4.8–5.6)
Mean Plasma Glucose: 143 mg/dL

## 2015-12-28 MED ORDER — LIVING WELL WITH DIABETES BOOK
Freq: Once | Status: DC
  Filled 2015-12-28: qty 1

## 2015-12-28 NOTE — Progress Notes (Addendum)
Inpatient Diabetes Program Recommendations  AACE/ADA: New Consensus Statement on Inpatient Glycemic Control (2015)  Target Ranges:  Prepandial:   less than 140 mg/dL      Peak postprandial:   less than 180 mg/dL (1-2 hours)      Critically ill patients:  140 - 180 mg/dL   Results for [name], [name] (MRN [id number]) as of 12/28/2015 10:01  Ref. Range 12/26/2015 21:35  Hemoglobin A1C Latest Ref Range: 4.8 - 5.6 % 6.6 (H)    Admit with: R LE Ulcer  History: Lymphedema, Morbid Obesity      MD- Note patient's A1C is 6.6%.  Do not see a History of DM in patient's H&P.  Is this a new diagnosis of DM?  If so, please address new diagnosis with patient.  DM Coordinator can then speak with patient and we will also have the RN begin basic DM education at bedside with patient.      --Will follow patient during hospitalization--  Ambrose FinlandJeannine Johnston Tenita Cue RN, MSN, CDE Diabetes Coordinator Inpatient Glycemic Control Team Team Pager: [phone] (8a-5p)

## 2015-12-28 NOTE — Progress Notes (Signed)
[facility] PROGRESS NOTE  [name] TDH:[id number] DOB: 1980-09-09 DOA: 12/26/2015  PCP: Simona Huh, MD  Brief History/Interval Summary: 35 year old African-American male with morbid obesity, chronic lymphedema, hypertension, was referred to the emergency department by his outpatient wound care provider for worsening right lower extremity calf ulcer with infection. Patient was hospitalized and placed on intravenous antibiotics for further management.  Reason for Visit: Acute wound infection  Consultants: Wound care  Procedures: None  Antibiotics: Vancomycin and Zosyn 10/17  Subjective/Interval History: Patient feels better. Pain in the right leg has improved. Patient's mother is at bedside. She has noticed at times when he sleeping that the patient stops breathing for a few seconds. He is waiting to undergo a sleep study.   ROS: Denies any chest pain. Denies any nausea or vomiting.  Objective:  Vital Signs  Vitals:   12/27/15 1638 12/27/15 1700 12/27/15 2156 12/28/15 0500  BP: (!) 143/66  (!) 177/68 (!) 160/103  Pulse: (!) 107  (!) 115 100  Resp: (!) 32 (!) 22 (!) 25 (!) 22  Temp: 98.1 F (36.7 C)  98.6 F (37 C) 98.8 F (37.1 C)  TempSrc: Oral  Oral Oral  SpO2: 91%  (!) 89% 96%  Weight:      Height:        Intake/Output Summary (Last 24 hours) at 12/28/15 1005 Last data filed at 12/28/15 6468  Gross per 24 hour  Intake              900 ml  Output              350 ml  Net              550 ml   Filed Weights   12/26/15 1537 12/26/15 2122  Weight: (!) 236.8 kg (522 lb) (!) 239 kg (527 lb)    General appearance: alert, cooperative, appears stated age, no distress and morbidly obese Resp: clear to auscultation bilaterally Cardio: regular rate and rhythm, S1, S2 normal, no murmur, click, rub or gallop GI: soft, non-tender; bowel sounds normal; no masses,  no organomegaly Extremities: Right lower extremity is covered in a dressing. Edema is  noted. Neurologic: Awake and alert. Oriented 3. No focal neurological deficits.  Lab Results:  Data Reviewed: I have personally reviewed following labs and imaging studies  CBC:  Recent Labs Lab 12/26/15 1611 12/27/15 0441  WBC 7.7 8.3  NEUTROABS 5.8  --   HGB 11.6* 10.8*  HCT 36.1* 34.8*  MCV 77.0* 78.9  PLT 240 032    Basic Metabolic Panel:  Recent Labs Lab 12/26/15 1611 12/27/15 0441  NA 138 138  K 3.4* 3.7  CL 102 105  CO2 29 27  GLUCOSE 121* 134*  BUN 11 13  CREATININE 0.89 0.85  CALCIUM 8.9 8.8*    GFR: Estimated Creatinine Clearance: 251 mL/min (by C-G formula based on SCr of 0.85 mg/dL).  Liver Function Tests:  Recent Labs Lab 12/27/15 0441  AST 19  ALT 18  ALKPHOS 45  BILITOT 0.8  PROT 7.4  ALBUMIN 3.6    Coagulation Profile:  Recent Labs Lab 12/26/15 2135  INR 1.14      Recent Results (from the past 240 hour(s))  Culture, blood (routine x 2)     Status: None (Preliminary result)   Collection Time: 12/26/15  4:21 PM  Result Value Ref Range Status   Specimen Description LEFT ANTECUBITAL  Final   Special Requests BOTTLES DRAWN AEROBIC AND ANAEROBIC  5CC  Final   Culture   Final    NO GROWTH < 24 HOURS Performed at [facility]    Report Status PENDING  Incomplete  Culture, blood (routine x 2)     Status: None (Preliminary result)   Collection Time: 12/26/15  9:36 PM  Result Value Ref Range Status   Specimen Description BLOOD LEFT ARM  Final   Special Requests BOTTLES DRAWN AEROBIC AND ANAEROBIC 3CC,5CC  Final   Culture   Final    NO GROWTH < 12 HOURS Performed at [facility]    Report Status PENDING  Incomplete  MRSA PCR Screening     Status: None   Collection Time: 12/27/15  3:14 AM  Result Value Ref Range Status   MRSA by PCR NEGATIVE NEGATIVE Final    Comment:        The GeneXpert MRSA Assay (FDA approved for NASAL specimens only), is one component of a comprehensive MRSA colonization surveillance  program. It is not intended to diagnose MRSA infection nor to guide or monitor treatment for MRSA infections.       Radiology Studies: Dg Chest 1 View  Result Date: 12/26/2015 CLINICAL DATA:  Shortness of breath EXAM: CHEST 1 VIEW COMPARISON:  Chest radiographs 08/28/2007 FINDINGS: Examination is degraded by underpenetration. The cardiac silhouette is enlarged. No focal airspace consolidation is visualized. There is shallow lung inflation without overt pulmonary edema. IMPRESSION: Cardiomegaly and shallow lung inflation.  No visible consolidation. Electronically Signed   By: Ulyses Jarred M.D.   On: 12/26/2015 20:50   Dg Tibia/fibula Right  Result Date: 12/26/2015 CLINICAL DATA:  Ulceration of right calf. EXAM: RIGHT TIBIA AND FIBULA - 2 VIEW COMPARISON:  None. FINDINGS: There is no evidence of fracture or other focal bone lesions. Surgical staples are noted posteriorly in the distal calf. Small diffuse calcifications are seen throughout the soft tissues suggesting dermatomyositis or old injury. Large skin ulceration is seen posteriorly in the calf. No lytic destruction is seen to suggest osteomyelitis. IMPRESSION: Large skin ulceration seen posteriorly in the distal calf. No definite evidence of osteomyelitis. Electronically Signed   By: Marijo Conception, M.D.   On: 12/26/2015 20:48     Medications:  Scheduled: . enoxaparin (LOVENOX) injection  120 mg Subcutaneous QHS  . lisinopril  20 mg Oral Daily  . piperacillin-tazobactam (ZOSYN)  IV  3.375 g Intravenous Q8H  . vancomycin  1,500 mg Intravenous Q12H   Continuous:  KWI:[id number] **OR** acetaminophen, hydrALAZINE, ondansetron **OR** ondansetron (ZOFRAN) IV, traMADol  Assessment/Plan:  Principal Problem:   Cellulitis Active Problems:   Venous stasis ulcer (HCC)   Hypertension   Obesity   Chronic acquired lymphedema    Chronic nonhealing ulcer to the right leg with acute cellulitis complicated by his chronic  lymphedema. Wound care nurse has seen the patient. Unna boot has been recommended. There is no need for surgical debridement at this time. Continue with the vancomycin and Zosyn for now. Cultures are negative so far. No osteomyelitis detected on x-ray. ESR is 30. Had a recent negative ultrasound for DVT. Continue IV antibiotics for 1 more day and then transitioned to oral tomorrow. Could consider doxycycline and Levaquin.  History of essential hypertension  Continue home dose of lisinopril. Holding HCTZ for now. IV hydralazine PRN with parameters. Blood pressure is noted to be elevated.  Dyspnea on exertion Saturating normally on room air. Chest x-ray does not show any acute findings. Lungs are clear to auscultation. BNP is only  132. Could be related to his obesity. He likely has sleep apnea as well. He tells me that he is waiting on an outpatient sleep study.  Morbid obesity Body mass index is 65.87 kg/m.   Hyperglycemia/likely newly diagnosed type 2 diabetes Patient's HbA1c is 6.6. Monitor CBGs. We will discuss with patient. Will need to be managed as outpatient.   DVT Prophylaxis: Lovenox    Code Status: Full code  Family Communication: Discussed with the patient Disposition Plan: Continue management as outlined above. Mobilize as tolerated.    LOS: 1 day   [facility] Pager [phone] 12/28/2015, 10:05 AM  If 7PM-7AM, please contact night-coverage at [email or web address], password [facility]

## 2015-12-29 DIAGNOSIS — E119 Type 2 diabetes mellitus without complications: Secondary | ICD-10-CM

## 2015-12-29 MED ORDER — LEVOFLOXACIN 500 MG PO TABS
500.0000 mg | ORAL_TABLET | Freq: Every day | ORAL | 0 refills | Status: DC
Start: 2015-12-30 — End: 2016-03-20

## 2015-12-29 MED ORDER — LEVOFLOXACIN 500 MG PO TABS
500.0000 mg | ORAL_TABLET | Freq: Every day | ORAL | Status: DC
  Administered 2015-12-29: 500 mg via ORAL
  Filled 2015-12-29 (×2): qty 1

## 2015-12-29 MED ORDER — DOXYCYCLINE HYCLATE 100 MG PO TABS
100.0000 mg | ORAL_TABLET | Freq: Two times a day (BID) | ORAL | Status: DC
  Administered 2015-12-29: 100 mg via ORAL
  Filled 2015-12-29: qty 1

## 2015-12-29 MED ORDER — DOXYCYCLINE HYCLATE 100 MG PO TABS: 100.0000 mg | ORAL_TABLET | Freq: Two times a day (BID) | ORAL | 0 refills | Status: DC

## 2015-12-29 NOTE — Discharge Instructions (Signed)
Type 2 Diabetes Mellitus, Adult °Type 2 diabetes mellitus, often simply referred to as type 2 diabetes, is a [place]-lasting (chronic) disease. In type 2 diabetes, the pancreas does not make enough insulin (a hormone), the cells are less responsive to the insulin that is made (insulin resistance), or both. Normally, insulin moves sugars from food into the tissue cells. The tissue cells use the sugars for energy. The lack of insulin or the lack of normal response to insulin causes excess sugars to build up in the blood instead of going into the tissue cells. As a result, high blood sugar (hyperglycemia) develops. The effect of high sugar (glucose) levels can cause many complications.  °Type 2 diabetes was also previously called adult-onset diabetes, but it can occur at any age.    °RISK FACTORS  °A person is predisposed to developing type 2 diabetes if someone in the family has the disease and also has one or more of the following primary risk factors: °· Weight gain, or being overweight or obese. °· An inactive lifestyle. °· A history of consistently eating high-calorie foods. °Maintaining a normal weight and regular physical activity can reduce the chance of developing type 2 diabetes. °SYMPTOMS  °A person with type 2 diabetes may not show symptoms initially. The symptoms of type 2 diabetes appear slowly. The symptoms include: °· Increased thirst (polydipsia). °· Increased urination (polyuria). °· Increased urination during the night (nocturia). °· Sudden or unexplained weight changes. °· Frequent, recurring infections. °· Tiredness (fatigue). °· Weakness. °· Vision changes, such as blurred vision. °· Fruity smell to your breath. °· Abdominal pain. °· Nausea or vomiting. °· Cuts or bruises which are slow to heal. °· Tingling or numbness in the hands or feet. °· An open skin wound (ulcer). °DIAGNOSIS °Type 2 diabetes is frequently not diagnosed until complications of diabetes are present. Type 2 diabetes is diagnosed  when symptoms or complications are present and when blood glucose levels are increased. Your blood glucose level may be checked by one or more of the following blood tests: °· A fasting blood glucose test. You will not be allowed to eat for at least 8 hours before a blood sample is taken. °· A random blood glucose test. Your blood glucose is checked at any time of the day regardless of when you ate. °· A hemoglobin A1c blood glucose test. A hemoglobin A1c test provides information about blood glucose control over the previous 3 months. °· An oral glucose tolerance test (OGTT). Your blood glucose is measured after you have not eaten (fasted) for 2 hours and then after you drink a glucose-containing beverage. °TREATMENT  °· You may need to take insulin or diabetes medicine daily to keep blood glucose levels in the desired range. °· If you use insulin, you may need to adjust the dosage depending on the carbohydrates that you eat with each meal or snack. °· Lifestyle changes are recommended as part of your treatment. These may include: °¨ Following an individualized diet plan developed by a nutritionist or dietitian. °¨ Exercising daily. °Your health care providers will set individualized treatment goals for you based on your age, your medicines, how [place] you have had diabetes, and any other medical conditions you have. Generally, the goal of treatment is to maintain the following blood glucose levels: °· Before meals (preprandial): 80-130 mg/dL. °· After meals (postprandial): below 180 mg/dL. °· A1c: less than 6.5-7%. °HOME CARE INSTRUCTIONS  °· Have your hemoglobin A1c level checked twice a year. °· Perform daily blood glucose monitoring   as directed by your health care provider. °· Monitor urine ketones when you are ill and as directed by your health care provider. °· Take your diabetes medicine or insulin as directed by your health care provider to maintain your blood glucose levels in the desired range. °¨ Never run  out of diabetes medicine or insulin. It is needed every day. °¨ If you are using insulin, you may need to adjust the amount of insulin given based on your intake of carbohydrates. Carbohydrates can raise blood glucose levels but need to be included in your diet. Carbohydrates provide vitamins, minerals, and fiber which are an essential part of a healthy diet. Carbohydrates are found in fruits, vegetables, whole grains, dairy products, legumes, and foods containing added sugars. °· Eat healthy foods. You should make an appointment to see a registered dietitian to help you create an eating plan that is right for you. °· Lose weight if you are overweight. °· Carry a medical alert card or wear your medical alert jewelry. °· Carry a 15-gram carbohydrate snack with you at all times to treat low blood glucose (hypoglycemia). Some examples of 15-gram carbohydrate snacks include: °¨ Glucose tablets, 3 or 4. °¨ Glucose gel, 15-gram tube. °¨ Raisins, 2 tablespoons (24 grams). °¨ Jelly beans, 6. °¨ Animal crackers, 8. °¨ Regular pop, 4 ounces (120 mL). °¨ Gummy treats, 9. °· Recognize hypoglycemia. Hypoglycemia occurs with blood glucose levels of 70 mg/dL and below. The risk for hypoglycemia increases when fasting or skipping meals, during or after intense exercise, and during sleep. Hypoglycemia symptoms can include: °¨ Tremors or shakes. °¨ Decreased ability to concentrate. °¨ Sweating. °¨ Increased heart rate. °¨ Headache. °¨ Dry mouth. °¨ Hunger. °¨ Irritability. °¨ Anxiety. °¨ Restless sleep. °¨ Altered speech or coordination. °¨ Confusion. °· Treat hypoglycemia promptly. If you are alert and able to safely swallow, follow the 15:15 rule: °¨ Take 15-20 grams of rapid-acting glucose or carbohydrate. Rapid-acting options include glucose gel, glucose tablets, or 4 ounces (120 mL) of fruit juice, regular soda, or low-fat milk. °¨ Check your blood glucose level 15 minutes after taking the glucose. °¨ Take 15-20 grams more of  glucose if the repeat blood glucose level is still 70 mg/dL or below. °¨ Eat a meal or snack within 1 hour once blood glucose levels return to normal. °· Be alert to feeling very thirsty and urinating more frequently than usual, which are early signs of hyperglycemia. An early awareness of hyperglycemia allows for prompt treatment. Treat hyperglycemia as directed by your health care provider. °· Engage in at least 150 minutes of moderate-intensity physical activity a week, spread over at least 3 days of the week or as directed by your health care provider. In addition, you should engage in resistance exercise at least 2 times a week or as directed by your health care provider. Try to spend no more than 90 minutes at one time inactive. °· Adjust your medicine and food intake as needed if you start a new exercise or sport. °· Follow your sick-day plan anytime you are unable to eat or drink as usual. °· Do not use any tobacco products including cigarettes, chewing tobacco, or electronic cigarettes. If you need help quitting, ask your health care provider. °· Limit alcohol intake to no more than 1 drink per day for nonpregnant women and 2 drinks per day for men. You should drink alcohol only when you are also eating food. Talk with your health care provider whether alcohol is safe   for you. Tell your health care provider if you drink alcohol several times a week. °· Keep all follow-up visits as directed by your health care provider. This is important. °· Schedule an eye exam soon after the diagnosis of type 2 diabetes and then annually. °· Perform daily skin and foot care. Examine your skin and feet daily for cuts, bruises, redness, nail problems, bleeding, blisters, or sores. A foot exam by a health care provider should be done annually. °· Brush your teeth and gums at least twice a day and floss at least once a day. Follow up with your dentist regularly. °· Share your diabetes management plan with your workplace or  school. °· Keep your immunizations up to date. It is recommended that you receive a flu (influenza) vaccine every year. It is also recommended that you receive a pneumonia (pneumococcal) vaccine. If you are 65 years of age or older and have never received a pneumonia vaccine, this vaccine may be given as a series of two separate shots. Ask your health care provider which additional vaccines may be recommended. °· Learn to manage stress. °· Obtain ongoing diabetes education and support as needed. °· Participate in or seek rehabilitation as needed to maintain or improve independence and quality of life. Request a physical or occupational therapy referral if you are having foot or hand numbness, or difficulties with grooming, dressing, eating, or physical activity. °SEEK MEDICAL CARE IF:  °· You are unable to eat food or drink fluids for more than 6 hours. °· You have nausea and vomiting for more than 6 hours. °· Your blood glucose level is over 240 mg/dL. °· There is a change in mental status. °· You develop an additional serious illness. °· You have diarrhea for more than 6 hours. °· You have been sick or have had a fever for a couple of days and are not getting better. °· You have pain during any physical activity.   °SEEK IMMEDIATE MEDICAL CARE IF: °· You have difficulty breathing. °· You have moderate to large ketone levels. °  °This information is not intended to replace advice given to you by your health care provider. Make sure you discuss any questions you have with your health care provider. °  °Document Released: 02/25/2005 Document Revised: 11/16/2014 Document Reviewed: 09/24/2011 °Elsevier Interactive Patient Education ©2016 [employer] ° °

## 2015-12-29 NOTE — Progress Notes (Signed)
Inpatient Diabetes Program Recommendations  AACE/ADA: New Consensus Statement on Inpatient Glycemic Control (2015)  Target Ranges:  Prepandial:   less than 140 mg/dL      Peak postprandial:   less than 180 mg/dL (1-2 hours)      Critically ill patients:  140 - 180 mg/dL   Results for [name], [name] (MRN [id number]) as of 12/29/2015 10:02  Ref. Range 12/26/2015 21:35  Hemoglobin A1C Latest Ref Range: 4.8 - 5.6 % 6.6 (H)    Admit with: R LE Ulcer  History: Lymphedema, Morbid Obesity      - Note patient's A1C is 6.6%.  Do not see a History of DM in patient's H&P.  -Spoke with patient this AM about his elevated A1c.  Patient told me Dr. Rito EhrlichKrishnan did not officially diagnose him with DM yesterday.  Patient told me Dr. Rito EhrlichKrishnan did talk with pt about his A1c.  Patient knew what an A1c was already.  Told me his last A1c was 5.5% at his PCP office.  Sees Dr. Manus [facility]unningEhinger with [facility] [address].  Has checked his CBGs in the past and told me he has a strong family history of DM.  Patient told me he has a follow-up appt with PCP at the end of this month.  Encouraged pt to discuss his A1c of 6.6% with PCP at next appt.    -Discussed with pt that I have ordered an educational booklet on DM for pt so that he has information about DM to take home with him.  Also relayed to pt that I have ordered an RD consult to review better nutrition at home to help with weigth loss and glucose control.  Patient appreciative of information.  Patient stated he knows how to check CBGs at home and can check if needed at time of d/c.    --Will follow patient during hospitalization--  Ambrose FinlandJeannine Johnston Sekai Gitlin RN, MSN, CDE Diabetes Coordinator Inpatient Glycemic Control Team Team Pager: [phone] (8a-5p)

## 2015-12-29 NOTE — Discharge Summary (Signed)
[facility]  Physician Discharge Summary   Patient ID: [name] MRN: [id number] DOB/AGE: 1980-10-13 35 y.o.  Admit date: 12/26/2015 Discharge date: 12/29/2015  PCP: Simona Huh, MD  DISCHARGE DIAGNOSES:  Principal Problem:   Cellulitis Active Problems:   Venous stasis ulcer ([place])   Hypertension   Obesity   Chronic acquired lymphedema   Wound infection   Hyperglycemia   Type 2 diabetes mellitus (HCC)   RECOMMENDATIONS FOR OUTPATIENT FOLLOW UP: 1. Patient with newly diagnosed type 2 diabetes. This will need close outpatient monitoring and initiation of treatment. 2. Close follow-up with wound care center   DISCHARGE CONDITION: fair  Diet recommendation: Modified carbohydrate  Filed Weights   12/26/15 1537 12/26/15 2122  Weight: (!) 236.8 kg (522 lb) (!) 239 kg (527 lb)    INITIAL HISTORY: 35 year old African-American male with morbid obesity, chronic lymphedema, hypertension, was referred to the emergency department by his outpatient wound care provider for worsening right lower extremity calf ulcer with infection. Patient was hospitalized and placed on intravenous antibiotics for further management.  Consultations:  Wound care  Procedures:  None  HOSPITAL COURSE:   Chronic nonhealing ulcer to the right leg with acute cellulitis complicated by his chronic lymphedema. Patient was admitted to the hospital and placed on broad-spectrum antibiotics. Patient was seen by wound care nurse. There was no need for surgical debridement. Unna boot was ordered. Patient feels much better. Pain has improved. No osteomyelitis detected on x-ray. ESR was 30. Recent negative Doppler study for DVT. Transition to oral doxycycline and Levaquin today. Will be discharged on the same. Patient has an appointment at the wound center on Tuesday.   History of essential hypertension  Resume home medications.  Dyspnea on exertion Saturating normally on room air. Chest  x-ray does not show any acute findings. Lungs are clear to auscultation. BNP is only 132. Could be related to his obesity. He likely has sleep apnea as well. He tells me that he is waiting on an outpatient sleep study.  Morbid obesity Body mass index is 65.87 kg/m.   Hyperglycemia/likely newly diagnosed type 2 diabetes Patient's HbA1c is 6.6. Discussed in detail with the patient. Dietitian has also spoken to the patient. He may benefit from being initiated on metformin. However, we'll prefer that this be addressed by his primary care provider.  Overall improved. Patient wishes to go home. Stable for discharge today.   PERTINENT LABS:  The results of significant diagnostics from this hospitalization (including imaging, microbiology, ancillary and laboratory) are listed below for reference.    Microbiology: Recent Results (from the past 240 hour(s))  Culture, blood (routine x 2)     Status: None (Preliminary result)   Collection Time: 12/26/15  4:21 PM  Result Value Ref Range Status   Specimen Description LEFT ANTECUBITAL  Final   Special Requests BOTTLES DRAWN AEROBIC AND ANAEROBIC 5CC  Final   Culture   Final    NO GROWTH 3 DAYS Performed at [facility]    Report Status PENDING  Incomplete  Culture, blood (routine x 2)     Status: None (Preliminary result)   Collection Time: 12/26/15  9:36 PM  Result Value Ref Range Status   Specimen Description BLOOD LEFT ARM  Final   Special Requests BOTTLES DRAWN AEROBIC AND ANAEROBIC 3CC,5CC  Final   Culture   Final    NO GROWTH 2 DAYS Performed at [facility]    Report Status PENDING  Incomplete  MRSA PCR Screening  Status: None   Collection Time: 12/27/15  3:14 AM  Result Value Ref Range Status   MRSA by PCR NEGATIVE NEGATIVE Final    Comment:        The GeneXpert MRSA Assay (FDA approved for NASAL specimens only), is one component of a comprehensive MRSA colonization surveillance program. It is not intended  to diagnose MRSA infection nor to guide or monitor treatment for MRSA infections.      Labs: Basic Metabolic Panel:  Recent Labs Lab 12/26/15 1611 12/27/15 0441  NA 138 138  K 3.4* 3.7  CL 102 105  CO2 29 27  GLUCOSE 121* 134*  BUN 11 13  CREATININE 0.89 0.85  CALCIUM 8.9 8.8*   Liver Function Tests:  Recent Labs Lab 12/27/15 0441  AST 19  ALT 18  ALKPHOS 45  BILITOT 0.8  PROT 7.4  ALBUMIN 3.6   CBC:  Recent Labs Lab 12/26/15 1611 12/27/15 0441  WBC 7.7 8.3  NEUTROABS 5.8  --   HGB 11.6* 10.8*  HCT 36.1* 34.8*  MCV 77.0* 78.9  PLT 240 224   BNP: BNP (last 3 results)  Recent Labs  12/26/15 2136  BNP 132.2*     IMAGING STUDIES Dg Chest 1 View  Result Date: 12/26/2015 CLINICAL DATA:  Shortness of breath EXAM: CHEST 1 VIEW COMPARISON:  Chest radiographs 08/28/2007 FINDINGS: Examination is degraded by underpenetration. The cardiac silhouette is enlarged. No focal airspace consolidation is visualized. There is shallow lung inflation without overt pulmonary edema. IMPRESSION: Cardiomegaly and shallow lung inflation.  No visible consolidation. Electronically Signed   By: Ulyses Jarred M.D.   On: 12/26/2015 20:50   Dg Tibia/fibula Right  Result Date: 12/26/2015 CLINICAL DATA:  Ulceration of right calf. EXAM: RIGHT TIBIA AND FIBULA - 2 VIEW COMPARISON:  None. FINDINGS: There is no evidence of fracture or other focal bone lesions. Surgical staples are noted posteriorly in the distal calf. Small diffuse calcifications are seen throughout the soft tissues suggesting dermatomyositis or old injury. Large skin ulceration is seen posteriorly in the calf. No lytic destruction is seen to suggest osteomyelitis. IMPRESSION: Large skin ulceration seen posteriorly in the distal calf. No definite evidence of osteomyelitis. Electronically Signed   By: Marijo Conception, M.D.   On: 12/26/2015 20:48    DISCHARGE EXAMINATION: Vitals:   12/28/15 0500 12/28/15 1418 12/28/15  2123 12/29/15 0528  BP: (!) 160/103 (!) 196/116 (!) 166/94 (!) 165/104  Pulse: 100 93 (!) 101 98  Resp: (!) 22 (!) 22 20 (!) 22  Temp: 98.8 F (37.1 C)  99.3 F (37.4 C) 97.9 F (36.6 C)  TempSrc: Oral Oral Oral Oral  SpO2: 96% 90% 96% 94%  Weight:      Height:       General appearance: alert, cooperative, appears stated age, no distress and morbidly obese Resp: clear to auscultation bilaterally Cardio: regular rate and rhythm, S1, S2 normal, no murmur, click, rub or gallop GI: soft, non-tender; bowel sounds normal; no masses,  no organomegaly Extremities: right leg is in a dressing  DISPOSITION: Home  Discharge Instructions    Call MD for:  difficulty breathing, headache or visual disturbances    Complete by:  As directed    Call MD for:  extreme fatigue    Complete by:  As directed    Call MD for:  persistant dizziness or light-headedness    Complete by:  As directed    Call MD for:  persistant nausea and vomiting  Complete by:  As directed    Call MD for:  redness, tenderness, or signs of infection (pain, swelling, redness, odor or green/yellow discharge around incision site)    Complete by:  As directed    Call MD for:  severe uncontrolled pain    Complete by:  As directed    Call MD for:  temperature >100.4    Complete by:  As directed    Diet Carb Modified    Complete by:  As directed    Discharge instructions    Complete by:  As directed    Please discuss your newly diagnosed diabetes with your primary care provider. You will need to be started on medications for the same in the near future. Please follow-up with your wound care doctor as previously scheduled. Take medications as prescribed.  You were cared for by a hospitalist during your hospital stay. If you have any questions about your discharge medications or the care you received while you were in the hospital after you are discharged, you can call the unit and asked to speak with the hospitalist on call if  the hospitalist that took care of you is not available. Once you are discharged, your primary care physician will handle any further medical issues. Please note that NO REFILLS for any discharge medications will be authorized once you are discharged, as it is imperative that you return to your primary care physician (or establish a relationship with a primary care physician if you do not have one) for your aftercare needs so that they can reassess your need for medications and monitor your lab values. If you do not have a primary care physician, you can call [phone] for a physician referral.   Increase activity slowly    Complete by:  As directed       ALLERGIES:  Allergies  Allergen Reactions  . Lidocaine Other (See Comments)    "burns my skin"  . Peanut-Containing Drug Products Swelling    Current Discharge Medication List    START taking these medications   Details  doxycycline (VIBRA-TABS) 100 MG tablet Take 1 tablet (100 mg total) by mouth every 12 (twelve) hours. For 2 weeks Qty: 28 tablet, Refills: 0    levofloxacin (LEVAQUIN) 500 MG tablet Take 1 tablet (500 mg total) by mouth daily. For 2 weeks Qty: 14 tablet, Refills: 0      CONTINUE these medications which have NOT CHANGED   Details  lisinopril-hydrochlorothiazide (PRINZIDE,ZESTORETIC) 20-25 MG tablet Take 1 tablet by mouth daily.     traMADol (ULTRAM) 50 MG tablet Take 50 mg by mouth every 6 (six) hours as needed for moderate pain or severe pain.       STOP taking these medications     amoxicillin-clavulanate (AUGMENTIN) 875-125 MG tablet         Follow-up Information    Simona Huh, MD Follow up in 1 week(s).   Specialty:  Family Medicine Why:  to discuss newly diagnosed diabetes Contact information: [address] [place] [address] [phone]        Cyndee Brightly, MD .   Specialty:  Internal Medicine Why:  As scheduled previously for right lower extremity  wound Contact information: [address] [place]  [address] [phone]           TOTAL DISCHARGE TIME: 35 minutes  [facility] Pager [phone]  12/29/2015, 1:16 PM

## 2015-12-29 NOTE — Plan of Care (Signed)
Problem: Food- and Nutrition-Related Knowledge Deficit (NB-1.1) Goal: Nutrition education Formal process to instruct or train a patient/client in a skill or to impart knowledge to help patients/clients voluntarily manage or modify food choices and eating behavior to maintain or improve health. Outcome: Completed/Met Date Met: 12/29/15  RD consulted for nutrition education regarding diabetes.   Lab Results  Component Value Date   HGBA1C 6.6 (H) 12/26/2015   Patient reports he has friends/family who have diabetes and count carbohydrates. He reports that he knows to limit sodas and juice and add fruit and vegetables in his diet. Reports he has met with a nutritionist or Dietitian before regarding healthy eating habits. He reports he made those changes for a while, but were unable to keep them [place]-term. Patient reports he normally eats 3 meals per day. Strongly encouraged patient to ask primary care provider at appointment next week for a referral for outpatient nutrition counseling to help with making [place]-term, sustainable lifestyle changes.  Typical Intake: Breakfast - eggs, grits, juice Lunch - [place] food with mixed vegetables Dinner - fish/chicken/steak with rice Beverages - soda and juice  Patient's Self-Chosen Goals: 1. Monitor current intake of carbohydrates through CHO counting and see how it compares to recommendations (3-5 servings per meal, 1-2 servings per snack). 2. Limit intake of soda and juice.  RD provided "Carbohydrate Counting for People with Diabetes" handout from the Academy of Nutrition and Dietetics. Discussed different food groups and their effects on blood sugar, emphasizing carbohydrate-containing foods. Provided list of carbohydrates and recommended serving sizes of common foods.  Discussed importance of controlled and consistent carbohydrate intake throughout the day. Provided examples of ways to balance meals/snacks and encouraged intake of high-fiber, whole  grain complex carbohydrates. Teach back method used.  Expect fair compliance.  Body mass index is 65.87 kg/m. Pt meets criteria for Obesity Class III based on current BMI.  Current diet order is Heart Healthy with 2 L fluid restriction, patient is consuming approximately 100% of meals at this time. Labs and medications reviewed. No further nutrition interventions warranted at this time. RD contact information provided. If additional nutrition issues arise, please re-consult RD.  Willey Blade, MS, RD, LDN Pager: [phone] After Hours Pager: [phone]

## 2015-12-31 LAB — CULTURE, BLOOD (ROUTINE X 2): Culture: NO GROWTH

## 2016-01-01 LAB — CULTURE, BLOOD (ROUTINE X 2): Culture: NO GROWTH

## 2016-01-02 DIAGNOSIS — L03115 Cellulitis of right lower limb: Secondary | ICD-10-CM | POA: Diagnosis not present

## 2016-01-02 DIAGNOSIS — E669 Obesity, unspecified: Secondary | ICD-10-CM | POA: Diagnosis not present

## 2016-01-02 DIAGNOSIS — Z6841 Body Mass Index (BMI) 40.0 and over, adult: Secondary | ICD-10-CM | POA: Diagnosis not present

## 2016-01-02 DIAGNOSIS — I87311 Chronic venous hypertension (idiopathic) with ulcer of right lower extremity: Secondary | ICD-10-CM | POA: Diagnosis present

## 2016-01-02 DIAGNOSIS — L97812 Non-pressure chronic ulcer of other part of right lower leg with fat layer exposed: Secondary | ICD-10-CM | POA: Diagnosis not present

## 2016-01-02 DIAGNOSIS — I10 Essential (primary) hypertension: Secondary | ICD-10-CM | POA: Diagnosis not present

## 2016-01-02 DIAGNOSIS — L97212 Non-pressure chronic ulcer of right calf with fat layer exposed: Secondary | ICD-10-CM | POA: Diagnosis not present

## 2016-01-02 DIAGNOSIS — I890 Lymphedema, not elsewhere classified: Secondary | ICD-10-CM | POA: Diagnosis not present

## 2016-01-02 DIAGNOSIS — E1151 Type 2 diabetes mellitus with diabetic peripheral angiopathy without gangrene: Secondary | ICD-10-CM | POA: Diagnosis not present

## 2016-01-09 DIAGNOSIS — I87311 Chronic venous hypertension (idiopathic) with ulcer of right lower extremity: Secondary | ICD-10-CM | POA: Diagnosis not present

## 2016-01-16 ENCOUNTER — Encounter (HOSPITAL_BASED_OUTPATIENT_CLINIC_OR_DEPARTMENT_OTHER): Payer: Managed Care, Other (non HMO) | Attending: Surgery

## 2016-01-16 DIAGNOSIS — I87311 Chronic venous hypertension (idiopathic) with ulcer of right lower extremity: Secondary | ICD-10-CM | POA: Insufficient documentation

## 2016-01-16 DIAGNOSIS — E669 Obesity, unspecified: Secondary | ICD-10-CM | POA: Diagnosis not present

## 2016-01-16 DIAGNOSIS — I10 Essential (primary) hypertension: Secondary | ICD-10-CM | POA: Diagnosis not present

## 2016-01-16 DIAGNOSIS — E11622 Type 2 diabetes mellitus with other skin ulcer: Secondary | ICD-10-CM | POA: Insufficient documentation

## 2016-01-16 DIAGNOSIS — I890 Lymphedema, not elsewhere classified: Secondary | ICD-10-CM | POA: Insufficient documentation

## 2016-01-16 DIAGNOSIS — L97212 Non-pressure chronic ulcer of right calf with fat layer exposed: Secondary | ICD-10-CM | POA: Diagnosis not present

## 2016-01-16 DIAGNOSIS — L03115 Cellulitis of right lower limb: Secondary | ICD-10-CM | POA: Diagnosis not present

## 2016-01-19 DIAGNOSIS — E11622 Type 2 diabetes mellitus with other skin ulcer: Secondary | ICD-10-CM | POA: Diagnosis not present

## 2016-01-23 DIAGNOSIS — E11622 Type 2 diabetes mellitus with other skin ulcer: Secondary | ICD-10-CM | POA: Diagnosis not present

## 2016-01-30 DIAGNOSIS — E11622 Type 2 diabetes mellitus with other skin ulcer: Secondary | ICD-10-CM | POA: Diagnosis not present

## 2016-02-06 DIAGNOSIS — E11622 Type 2 diabetes mellitus with other skin ulcer: Secondary | ICD-10-CM | POA: Diagnosis not present

## 2016-02-13 ENCOUNTER — Encounter (HOSPITAL_BASED_OUTPATIENT_CLINIC_OR_DEPARTMENT_OTHER): Payer: Managed Care, Other (non HMO) | Attending: Surgery

## 2016-02-13 DIAGNOSIS — E11622 Type 2 diabetes mellitus with other skin ulcer: Secondary | ICD-10-CM | POA: Insufficient documentation

## 2016-02-13 DIAGNOSIS — E1151 Type 2 diabetes mellitus with diabetic peripheral angiopathy without gangrene: Secondary | ICD-10-CM | POA: Diagnosis not present

## 2016-02-13 DIAGNOSIS — I890 Lymphedema, not elsewhere classified: Secondary | ICD-10-CM | POA: Insufficient documentation

## 2016-02-13 DIAGNOSIS — Z86718 Personal history of other venous thrombosis and embolism: Secondary | ICD-10-CM | POA: Diagnosis not present

## 2016-02-13 DIAGNOSIS — L03115 Cellulitis of right lower limb: Secondary | ICD-10-CM | POA: Diagnosis not present

## 2016-02-13 DIAGNOSIS — I87311 Chronic venous hypertension (idiopathic) with ulcer of right lower extremity: Secondary | ICD-10-CM | POA: Insufficient documentation

## 2016-02-13 DIAGNOSIS — E669 Obesity, unspecified: Secondary | ICD-10-CM | POA: Insufficient documentation

## 2016-02-13 DIAGNOSIS — Z6841 Body Mass Index (BMI) 40.0 and over, adult: Secondary | ICD-10-CM | POA: Diagnosis not present

## 2016-02-13 DIAGNOSIS — L97212 Non-pressure chronic ulcer of right calf with fat layer exposed: Secondary | ICD-10-CM | POA: Diagnosis not present

## 2016-02-20 DIAGNOSIS — I87311 Chronic venous hypertension (idiopathic) with ulcer of right lower extremity: Secondary | ICD-10-CM | POA: Diagnosis not present

## 2016-02-27 DIAGNOSIS — I87311 Chronic venous hypertension (idiopathic) with ulcer of right lower extremity: Secondary | ICD-10-CM | POA: Diagnosis not present

## 2016-03-05 DIAGNOSIS — I87311 Chronic venous hypertension (idiopathic) with ulcer of right lower extremity: Secondary | ICD-10-CM | POA: Diagnosis not present

## 2016-03-12 ENCOUNTER — Encounter (HOSPITAL_BASED_OUTPATIENT_CLINIC_OR_DEPARTMENT_OTHER): Payer: BLUE CROSS/BLUE SHIELD | Attending: Surgery

## 2016-03-12 DIAGNOSIS — L97812 Non-pressure chronic ulcer of other part of right lower leg with fat layer exposed: Secondary | ICD-10-CM | POA: Diagnosis not present

## 2016-03-12 DIAGNOSIS — I890 Lymphedema, not elsewhere classified: Secondary | ICD-10-CM | POA: Insufficient documentation

## 2016-03-12 DIAGNOSIS — L97222 Non-pressure chronic ulcer of left calf with fat layer exposed: Secondary | ICD-10-CM | POA: Diagnosis not present

## 2016-03-12 DIAGNOSIS — I87311 Chronic venous hypertension (idiopathic) with ulcer of right lower extremity: Secondary | ICD-10-CM | POA: Insufficient documentation

## 2016-03-12 DIAGNOSIS — E11622 Type 2 diabetes mellitus with other skin ulcer: Secondary | ICD-10-CM | POA: Diagnosis not present

## 2016-03-12 DIAGNOSIS — L97212 Non-pressure chronic ulcer of right calf with fat layer exposed: Secondary | ICD-10-CM | POA: Diagnosis not present

## 2016-03-13 DIAGNOSIS — I890 Lymphedema, not elsewhere classified: Secondary | ICD-10-CM | POA: Diagnosis not present

## 2016-03-13 DIAGNOSIS — Z6841 Body Mass Index (BMI) 40.0 and over, adult: Secondary | ICD-10-CM | POA: Diagnosis not present

## 2016-03-13 DIAGNOSIS — I83212 Varicose veins of right lower extremity with both ulcer of calf and inflammation: Secondary | ICD-10-CM | POA: Diagnosis not present

## 2016-03-13 DIAGNOSIS — L97203 Non-pressure chronic ulcer of unspecified calf with necrosis of muscle: Secondary | ICD-10-CM | POA: Diagnosis not present

## 2016-03-19 DIAGNOSIS — R0600 Dyspnea, unspecified: Secondary | ICD-10-CM | POA: Diagnosis not present

## 2016-03-19 DIAGNOSIS — R609 Edema, unspecified: Secondary | ICD-10-CM | POA: Diagnosis not present

## 2016-03-19 DIAGNOSIS — Z79891 Long term (current) use of opiate analgesic: Secondary | ICD-10-CM | POA: Diagnosis not present

## 2016-03-19 DIAGNOSIS — L03311 Cellulitis of abdominal wall: Secondary | ICD-10-CM | POA: Diagnosis present

## 2016-03-19 DIAGNOSIS — Z6841 Body Mass Index (BMI) 40.0 and over, adult: Secondary | ICD-10-CM | POA: Diagnosis not present

## 2016-03-19 DIAGNOSIS — J9600 Acute respiratory failure, unspecified whether with hypoxia or hypercapnia: Secondary | ICD-10-CM | POA: Diagnosis not present

## 2016-03-19 DIAGNOSIS — E876 Hypokalemia: Secondary | ICD-10-CM | POA: Diagnosis not present

## 2016-03-19 DIAGNOSIS — I472 Ventricular tachycardia: Secondary | ICD-10-CM | POA: Diagnosis not present

## 2016-03-19 DIAGNOSIS — J9601 Acute respiratory failure with hypoxia: Secondary | ICD-10-CM | POA: Diagnosis not present

## 2016-03-19 DIAGNOSIS — E8770 Fluid overload, unspecified: Secondary | ICD-10-CM | POA: Diagnosis not present

## 2016-03-19 DIAGNOSIS — R1030 Lower abdominal pain, unspecified: Secondary | ICD-10-CM | POA: Diagnosis not present

## 2016-03-19 DIAGNOSIS — G4733 Obstructive sleep apnea (adult) (pediatric): Secondary | ICD-10-CM | POA: Diagnosis not present

## 2016-03-19 DIAGNOSIS — R601 Generalized edema: Secondary | ICD-10-CM | POA: Diagnosis not present

## 2016-03-19 DIAGNOSIS — D649 Anemia, unspecified: Secondary | ICD-10-CM | POA: Diagnosis present

## 2016-03-19 DIAGNOSIS — I5023 Acute on chronic systolic (congestive) heart failure: Secondary | ICD-10-CM | POA: Diagnosis not present

## 2016-03-19 DIAGNOSIS — E669 Obesity, unspecified: Secondary | ICD-10-CM | POA: Diagnosis not present

## 2016-03-19 DIAGNOSIS — I890 Lymphedema, not elsewhere classified: Secondary | ICD-10-CM | POA: Diagnosis not present

## 2016-03-19 DIAGNOSIS — I87311 Chronic venous hypertension (idiopathic) with ulcer of right lower extremity: Secondary | ICD-10-CM | POA: Diagnosis not present

## 2016-03-19 DIAGNOSIS — E11622 Type 2 diabetes mellitus with other skin ulcer: Secondary | ICD-10-CM | POA: Diagnosis not present

## 2016-03-19 DIAGNOSIS — R0602 Shortness of breath: Secondary | ICD-10-CM | POA: Diagnosis not present

## 2016-03-19 DIAGNOSIS — E119 Type 2 diabetes mellitus without complications: Secondary | ICD-10-CM | POA: Diagnosis not present

## 2016-03-19 DIAGNOSIS — I872 Venous insufficiency (chronic) (peripheral): Secondary | ICD-10-CM | POA: Diagnosis not present

## 2016-03-19 DIAGNOSIS — I10 Essential (primary) hypertension: Secondary | ICD-10-CM | POA: Diagnosis not present

## 2016-03-19 DIAGNOSIS — I160 Hypertensive urgency: Secondary | ICD-10-CM | POA: Diagnosis not present

## 2016-03-19 DIAGNOSIS — I110 Hypertensive heart disease with heart failure: Secondary | ICD-10-CM | POA: Diagnosis not present

## 2016-03-19 DIAGNOSIS — L97212 Non-pressure chronic ulcer of right calf with fat layer exposed: Secondary | ICD-10-CM | POA: Diagnosis not present

## 2016-03-19 DIAGNOSIS — I83009 Varicose veins of unspecified lower extremity with ulcer of unspecified site: Secondary | ICD-10-CM | POA: Diagnosis present

## 2016-03-19 DIAGNOSIS — I509 Heart failure, unspecified: Secondary | ICD-10-CM | POA: Diagnosis not present

## 2016-03-20 ENCOUNTER — Inpatient Hospital Stay (HOSPITAL_COMMUNITY)
Admission: EM | Admit: 2016-03-20 | Discharge: 2016-04-05 | DRG: 291 | Disposition: A | Discharge status: Home Health | Payer: BLUE CROSS/BLUE SHIELD | Attending: Family Medicine | Admitting: Family Medicine

## 2016-03-20 ENCOUNTER — Encounter (HOSPITAL_COMMUNITY): Payer: Self-pay

## 2016-03-20 ENCOUNTER — Observation Stay (HOSPITAL_BASED_OUTPATIENT_CLINIC_OR_DEPARTMENT_OTHER)
Admit: 2016-03-20 | Discharge: 2016-03-20 | Disposition: A | Discharge status: Home / Self Care | Payer: BLUE CROSS/BLUE SHIELD | Attending: Internal Medicine | Admitting: Internal Medicine

## 2016-03-20 ENCOUNTER — Observation Stay (HOSPITAL_COMMUNITY): Payer: BLUE CROSS/BLUE SHIELD

## 2016-03-20 ENCOUNTER — Other Ambulatory Visit (HOSPITAL_COMMUNITY): Payer: BLUE CROSS/BLUE SHIELD

## 2016-03-20 ENCOUNTER — Emergency Department (HOSPITAL_COMMUNITY): Payer: BLUE CROSS/BLUE SHIELD

## 2016-03-20 DIAGNOSIS — L97909 Non-pressure chronic ulcer of unspecified part of unspecified lower leg with unspecified severity: Secondary | ICD-10-CM

## 2016-03-20 DIAGNOSIS — R609 Edema, unspecified: Secondary | ICD-10-CM | POA: Diagnosis not present

## 2016-03-20 DIAGNOSIS — R0602 Shortness of breath: Secondary | ICD-10-CM | POA: Diagnosis present

## 2016-03-20 DIAGNOSIS — I110 Hypertensive heart disease with heart failure: Principal | ICD-10-CM | POA: Diagnosis present

## 2016-03-20 DIAGNOSIS — E876 Hypokalemia: Secondary | ICD-10-CM | POA: Diagnosis present

## 2016-03-20 DIAGNOSIS — I160 Hypertensive urgency: Secondary | ICD-10-CM | POA: Diagnosis not present

## 2016-03-20 DIAGNOSIS — I83009 Varicose veins of unspecified lower extremity with ulcer of unspecified site: Secondary | ICD-10-CM | POA: Diagnosis present

## 2016-03-20 DIAGNOSIS — R601 Generalized edema: Secondary | ICD-10-CM | POA: Diagnosis present

## 2016-03-20 DIAGNOSIS — D649 Anemia, unspecified: Secondary | ICD-10-CM | POA: Diagnosis present

## 2016-03-20 DIAGNOSIS — J9601 Acute respiratory failure with hypoxia: Secondary | ICD-10-CM | POA: Diagnosis present

## 2016-03-20 DIAGNOSIS — I472 Ventricular tachycardia: Secondary | ICD-10-CM | POA: Diagnosis present

## 2016-03-20 DIAGNOSIS — Z6841 Body Mass Index (BMI) 40.0 and over, adult: Secondary | ICD-10-CM

## 2016-03-20 DIAGNOSIS — I890 Lymphedema, not elsewhere classified: Secondary | ICD-10-CM

## 2016-03-20 DIAGNOSIS — G4733 Obstructive sleep apnea (adult) (pediatric): Secondary | ICD-10-CM | POA: Diagnosis present

## 2016-03-20 DIAGNOSIS — I4729 Other ventricular tachycardia: Secondary | ICD-10-CM

## 2016-03-20 DIAGNOSIS — E119 Type 2 diabetes mellitus without complications: Secondary | ICD-10-CM

## 2016-03-20 DIAGNOSIS — I509 Heart failure, unspecified: Secondary | ICD-10-CM | POA: Diagnosis present

## 2016-03-20 DIAGNOSIS — R109 Unspecified abdominal pain: Secondary | ICD-10-CM | POA: Diagnosis present

## 2016-03-20 DIAGNOSIS — I10 Essential (primary) hypertension: Secondary | ICD-10-CM | POA: Diagnosis not present

## 2016-03-20 DIAGNOSIS — R1030 Lower abdominal pain, unspecified: Secondary | ICD-10-CM | POA: Diagnosis not present

## 2016-03-20 DIAGNOSIS — L03311 Cellulitis of abdominal wall: Secondary | ICD-10-CM | POA: Diagnosis present

## 2016-03-20 DIAGNOSIS — J9600 Acute respiratory failure, unspecified whether with hypoxia or hypercapnia: Secondary | ICD-10-CM | POA: Diagnosis present

## 2016-03-20 HISTORY — DX: Dependence on other enabling machines and devices: Z99.89

## 2016-03-20 HISTORY — DX: Venous insufficiency (chronic) (peripheral): I87.2

## 2016-03-20 HISTORY — DX: Morbid (severe) obesity due to excess calories: E66.01

## 2016-03-20 HISTORY — DX: Essential (primary) hypertension: I10

## 2016-03-20 HISTORY — DX: Lymphedema, not elsewhere classified: I89.0

## 2016-03-20 HISTORY — DX: Obstructive sleep apnea (adult) (pediatric): G47.33

## 2016-03-20 LAB — I-STAT TROPONIN, ED: TROPONIN I, POC: 0.06 ng/mL (ref 0.00–0.08)

## 2016-03-20 LAB — COMPREHENSIVE METABOLIC PANEL
ALBUMIN: 3.7 g/dL (ref 3.5–5.0)
ALK PHOS: 58 U/L (ref 38–126)
ALT: 22 U/L (ref 17–63)
ANION GAP: 11 (ref 5–15)
AST: 32 U/L (ref 15–41)
BILIRUBIN TOTAL: 0.8 mg/dL (ref 0.3–1.2)
BUN: 19 mg/dL (ref 6–20)
CALCIUM: 9 mg/dL (ref 8.9–10.3)
CO2: 26 mmol/L (ref 22–32)
CREATININE: 0.98 mg/dL (ref 0.61–1.24)
Chloride: 103 mmol/L (ref 101–111)
GFR calc non Af Amer: >60 mL/min (ref >60)
GLUCOSE: 100 mg/dL — AB (ref 65–99)
Potassium: 3.1 mmol/L — ABNORMAL LOW (ref 3.5–5.1)
Sodium: 140 mmol/L (ref 135–145)
TOTAL PROTEIN: 7.5 g/dL (ref 6.5–8.1)

## 2016-03-20 LAB — MAGNESIUM: MAGNESIUM: 1.9 mg/dL (ref 1.7–2.4)

## 2016-03-20 LAB — BASIC METABOLIC PANEL
ANION GAP: 10 (ref 5–15)
BUN: 20 mg/dL (ref 6–20)
CHLORIDE: 103 mmol/L (ref 101–111)
CO2: 27 mmol/L (ref 22–32)
Calcium: 8.9 mg/dL (ref 8.9–10.3)
Creatinine, Ser: 1.13 mg/dL (ref 0.61–1.24)
GFR calc non Af Amer: >60 mL/min (ref >60)
Glucose, Bld: 104 mg/dL — ABNORMAL HIGH (ref 65–99)
POTASSIUM: 3.4 mmol/L — AB (ref 3.5–5.1)
Sodium: 140 mmol/L (ref 135–145)

## 2016-03-20 LAB — CBC WITH DIFFERENTIAL/PLATELET
BASOS ABS: 0 10*3/uL (ref 0.0–0.1)
BASOS ABS: 0 10*3/uL (ref 0.0–0.1)
BASOS PCT: 0 %
Basophils Relative: 0 %
EOS ABS: 0.2 10*3/uL (ref 0.0–0.7)
Eosinophils Absolute: 0.3 10*3/uL (ref 0.0–0.7)
Eosinophils Relative: 4 %
Eosinophils Relative: 3 %
HEMATOCRIT: 34.9 % — AB (ref 39.0–52.0)
HEMATOCRIT: 34 % — AB (ref 39.0–52.0)
HEMOGLOBIN: 11.2 g/dL — AB (ref 13.0–17.0)
Hemoglobin: 10.8 g/dL — ABNORMAL LOW (ref 13.0–17.0)
Lymphocytes Relative: 17 %
Lymphocytes Relative: 16 %
Lymphs Abs: 1.3 10*3/uL (ref 0.7–4.0)
Lymphs Abs: 1.2 10*3/uL (ref 0.7–4.0)
MCH: 24.7 pg — ABNORMAL LOW (ref 26.0–34.0)
MCH: 24.4 pg — ABNORMAL LOW (ref 26.0–34.0)
MCHC: 32.1 g/dL (ref 30.0–36.0)
MCHC: 31.8 g/dL (ref 30.0–36.0)
MCV: 76.9 fL — ABNORMAL LOW (ref 78.0–100.0)
MCV: 76.7 fL — ABNORMAL LOW (ref 78.0–100.0)
MONO ABS: 0.8 10*3/uL (ref 0.1–1.0)
MONOS PCT: 7 %
MONOS PCT: 11 %
Monocytes Absolute: 0.6 10*3/uL (ref 0.1–1.0)
NEUTROS ABS: 5 10*3/uL (ref 1.7–7.7)
NEUTROS PCT: 72 %
Neutro Abs: 5.5 10*3/uL (ref 1.7–7.7)
Neutrophils Relative %: 70 %
PLATELETS: 224 10*3/uL (ref 150–400)
Platelets: 240 10*3/uL (ref 150–400)
RBC: 4.54 MIL/uL (ref 4.22–5.81)
RBC: 4.43 MIL/uL (ref 4.22–5.81)
RDW: 17.7 % — ABNORMAL HIGH (ref 11.5–15.5)
RDW: 17.8 % — AB (ref 11.5–15.5)
WBC: 7.7 10*3/uL (ref 4.0–10.5)
WBC: 7.2 10*3/uL (ref 4.0–10.5)

## 2016-03-20 LAB — TROPONIN I
TROPONIN I: 0.09 ng/mL — AB (ref <0.03)
Troponin I: 0.08 ng/mL (ref <0.03)
Troponin I: 0.08 ng/mL (ref <0.03)

## 2016-03-20 LAB — IRON AND TIBC
IRON: 32 ug/dL — AB (ref 45–182)
Saturation Ratios: 9 % — ABNORMAL LOW (ref 17.9–39.5)
TIBC: 351 ug/dL (ref 250–450)
UIBC: 319 ug/dL

## 2016-03-20 LAB — BRAIN NATRIURETIC PEPTIDE: B Natriuretic Peptide: 171 pg/mL — ABNORMAL HIGH (ref 0.0–100.0)

## 2016-03-20 LAB — MRSA PCR SCREENING: MRSA by PCR: NEGATIVE

## 2016-03-20 LAB — FERRITIN: FERRITIN: 55 ng/mL (ref 24–336)

## 2016-03-20 LAB — RETICULOCYTES
RBC.: 4.48 MIL/uL (ref 4.22–5.81)
RETIC COUNT ABSOLUTE: 76.2 10*3/uL (ref 19.0–186.0)
Retic Ct Pct: 1.7 % (ref 0.4–3.1)

## 2016-03-20 LAB — FOLATE: Folate: 6.4 ng/mL (ref >5.9)

## 2016-03-20 LAB — VITAMIN B12: Vitamin B-12: 685 pg/mL (ref 180–914)

## 2016-03-20 MED ORDER — IOPAMIDOL (ISOVUE-370) INJECTION 76%
100.0000 mL | Freq: Once | INTRAVENOUS | Status: AC | PRN
  Administered 2016-03-20: 100 mL via INTRAVENOUS

## 2016-03-20 MED ORDER — ONDANSETRON HCL 4 MG/2ML IJ SOLN
4.0000 mg | Freq: Four times a day (QID) | INTRAMUSCULAR | Status: DC | PRN
  Administered 2016-03-25: 4 mg via INTRAVENOUS
  Filled 2016-03-20: qty 2

## 2016-03-20 MED ORDER — FUROSEMIDE 10 MG/ML IJ SOLN
40.0000 mg | Freq: Once | INTRAMUSCULAR | Status: AC
  Administered 2016-03-20: 40 mg via INTRAVENOUS
  Filled 2016-03-20: qty 4

## 2016-03-20 MED ORDER — HYDROCHLOROTHIAZIDE 25 MG PO TABS
25.0000 mg | ORAL_TABLET | Freq: Every day | ORAL | Status: DC
  Administered 2016-03-20 – 2016-03-21 (×2): 25 mg via ORAL
  Filled 2016-03-20 (×2): qty 1

## 2016-03-20 MED ORDER — FUROSEMIDE 10 MG/ML IJ SOLN
40.0000 mg | Freq: Once | INTRAMUSCULAR | Status: AC
  Administered 2016-03-21: 40 mg via INTRAVENOUS
  Filled 2016-03-20: qty 4

## 2016-03-20 MED ORDER — ONDANSETRON HCL 4 MG PO TABS: 4.0000 mg | ORAL_TABLET | Freq: Four times a day (QID) | ORAL | Status: DC | PRN

## 2016-03-20 MED ORDER — HYDRALAZINE HCL 20 MG/ML IJ SOLN
10.0000 mg | INTRAMUSCULAR | Status: DC | PRN
  Administered 2016-03-20 – 2016-03-23 (×3): 10 mg via INTRAVENOUS
  Filled 2016-03-20 (×4): qty 1

## 2016-03-20 MED ORDER — LISINOPRIL-HYDROCHLOROTHIAZIDE 20-25 MG PO TABS: 1.0000 | ORAL_TABLET | Freq: Every day | ORAL | Status: DC

## 2016-03-20 MED ORDER — ENOXAPARIN SODIUM 100 MG/ML ~~LOC~~ SOLN
100.0000 mg | SUBCUTANEOUS | Status: DC
  Administered 2016-03-20 – 2016-03-24 (×5): 100 mg via SUBCUTANEOUS
  Filled 2016-03-20 (×5): qty 1

## 2016-03-20 MED ORDER — POTASSIUM CHLORIDE CRYS ER 20 MEQ PO TBCR
40.0000 meq | EXTENDED_RELEASE_TABLET | Freq: Once | ORAL | Status: AC
  Administered 2016-03-20: 40 meq via ORAL
  Filled 2016-03-20: qty 2

## 2016-03-20 MED ORDER — ACETAMINOPHEN 650 MG RE SUPP: 650.0000 mg | Freq: Four times a day (QID) | RECTAL | Status: DC | PRN

## 2016-03-20 MED ORDER — DOXYCYCLINE HYCLATE 100 MG IV SOLR
100.0000 mg | Freq: Two times a day (BID) | INTRAVENOUS | Status: DC
  Administered 2016-03-20 – 2016-03-25 (×11): 100 mg via INTRAVENOUS
  Filled 2016-03-20 (×13): qty 100

## 2016-03-20 MED ORDER — LISINOPRIL 20 MG PO TABS
20.0000 mg | ORAL_TABLET | Freq: Once | ORAL | Status: AC
  Administered 2016-03-20: 20 mg via ORAL
  Filled 2016-03-20: qty 1

## 2016-03-20 MED ORDER — HYDROCHLOROTHIAZIDE 12.5 MG PO CAPS
25.0000 mg | ORAL_CAPSULE | Freq: Once | ORAL | Status: AC
  Administered 2016-03-20: 25 mg via ORAL
  Filled 2016-03-20: qty 2

## 2016-03-20 MED ORDER — LISINOPRIL 20 MG PO TABS
20.0000 mg | ORAL_TABLET | Freq: Every day | ORAL | Status: DC
  Administered 2016-03-20 – 2016-03-24 (×5): 20 mg via ORAL
  Filled 2016-03-20 (×6): qty 1

## 2016-03-20 MED ORDER — ACETAMINOPHEN 325 MG PO TABS
650.0000 mg | ORAL_TABLET | Freq: Four times a day (QID) | ORAL | Status: DC | PRN
  Administered 2016-03-22 – 2016-03-23 (×3): 650 mg via ORAL
  Filled 2016-03-20 (×3): qty 2

## 2016-03-20 NOTE — H&P (Signed)
History and Physical    Shawn Majordrian T Mcleish ZOX:[id number] DOB: 12-10-1980 DOA: 03/20/2016  PCP: Thora LanceEHINGER,ROBERT R, MD  Patient coming from: Home.  Chief Complaint: Shortness of breath.  HPI: [name] is a 36 y.o. male with history of super morbid obesity, hypertension, sleep apnea presents to the ER because of increasing shortness of breath over the last few days. Patient states he may have gained at least 70 pounds in the last 1 month. Shortness of breath is particularly increased on exertion. Denies any chest pain, productive cough fever or chills. Patient also noticed increasing swelling in his lower extremity. His abdominal girth also has increased. Complains of increasing pain around the left side of his abdomen wall. Denies any fall or trauma. On exam patient is not in distress. Chest x-ray shows cardiomegaly. Abdomen appears slightly erythematous on the left side. Patient has chronic wound on the right lower extremity. At presentation patient's blood pressure is also markedly elevated.   ED Course: Patient was given his home dose of lisinopril and hydrochlorothiazide. Chest x-ray was showing cardiomegaly. EKG was showing sinus tachycardia.  Review of Systems: As per HPI, rest all negative.   Past Medical History:  Diagnosis Date  . Hypertension   . Obesity     Past Surgical History:  Procedure Laterality Date  . balloon stenting Right    right leg  . VEIN LIGATION AND STRIPPING       reports that he has never smoked. He has never used smokeless tobacco. He reports that he drinks about 2.0 oz of alcohol per week . He reports that he does not use drugs.  Allergies  Allergen Reactions  . Lidocaine Other (See Comments)    "burns my skin"  . Peanut-Containing Drug Products Swelling    Family History  Problem Relation Age of Onset  . Diabetes Mother   . Cancer Father     colon  . Cancer Maternal Grandmother   . Cancer Maternal Grandfather     Prior to Admission  medications   Medication Sig Start Date End Date Taking? Authorizing Provider  ibuprofen (ADVIL,MOTRIN) 200 MG tablet Take 800 mg by mouth every 6 (six) hours as needed for moderate pain.   Yes Historical Provider, MD  lisinopril-hydrochlorothiazide (PRINZIDE,ZESTORETIC) 20-25 MG tablet Take 1 tablet by mouth daily.  12/21/15  Yes Historical Provider, MD    Physical Exam: Vitals:   03/20/16 0205 03/20/16 0307 03/20/16 0314 03/20/16 0500  BP: (!) 203/90 (!) 185/105 (!) 185/105 (!) 162/104  Pulse:  105 104 105  Resp:  (!) 28 22 (!) 9  Temp:      TempSrc:      SpO2:  96% 95% 91%  Weight:          Constitutional: Morbidly obese. Not in distress. Vitals:   03/20/16 0205 03/20/16 0307 03/20/16 0314 03/20/16 0500  BP: (!) 203/90 (!) 185/105 (!) 185/105 (!) 162/104  Pulse:  105 104 105  Resp:  (!) 28 22 (!) 9  Temp:      TempSrc:      SpO2:  96% 95% 91%  Weight:       Eyes: Anicteric no pallor. ENMT: No discharge from the ears eyes nose and mouth. Neck: No mass felt. No JVD appreciated. Respiratory: No rhonchi or crepitations. Cardiovascular: S1 and S2 heard. No murmurs appreciated. Abdomen: Mild skin reddening of the left side of the abdominal wall. Nontender bowel sounds present. Musculoskeletal: Wound on the right leg. Dressing done. Left  lower extremity swelling. Skin: Wound on the right leg. Left lower extremity swollen. Neurologic: Alert awake oriented to time place and person. Moves all extremities. Psychiatric: Appears normal. Normal affect.   Labs on Admission: I have personally reviewed following labs and imaging studies  CBC:  Recent Labs Lab 03/20/16 0118  WBC 7.7  NEUTROABS 5.5  HGB 11.2*  HCT 34.9*  MCV 76.9*  PLT 240   Basic Metabolic Panel:  Recent Labs Lab 03/20/16 0118  NA 140  K 3.4*  CL 103  CO2 27  GLUCOSE 104*  BUN 20  CREATININE 1.13  CALCIUM 8.9   GFR: Estimated Creatinine Clearance: 213.3 mL/min (by C-G formula based on SCr of  1.13 mg/dL). Liver Function Tests: No results for input(s): AST, ALT, ALKPHOS, BILITOT, PROT, ALBUMIN in the last 168 hours. No results for input(s): LIPASE, AMYLASE in the last 168 hours. No results for input(s): AMMONIA in the last 168 hours. Coagulation Profile: No results for input(s): INR, PROTIME in the last 168 hours. Cardiac Enzymes: No results for input(s): CKTOTAL, CKMB, CKMBINDEX, TROPONINI in the last 168 hours. BNP (last 3 results) No results for input(s): PROBNP in the last 8760 hours. HbA1C: No results for input(s): HGBA1C in the last 72 hours. CBG: No results for input(s): GLUCAP in the last 168 hours. Lipid Profile: No results for input(s): CHOL, HDL, LDLCALC, TRIG, CHOLHDL, LDLDIRECT in the last 72 hours. Thyroid Function Tests: No results for input(s): TSH, T4TOTAL, FREET4, T3FREE, THYROIDAB in the last 72 hours. Anemia Panel: No results for input(s): VITAMINB12, FOLATE, FERRITIN, TIBC, IRON, RETICCTPCT in the last 72 hours. Urine analysis:    Component Value Date/Time   LABSPEC >=1.030 08/27/2014 1602   PHURINE 6.0 08/27/2014 1602   GLUCOSEU NEGATIVE 08/27/2014 1602   HGBUR TRACE (A) 08/27/2014 1602   BILIRUBINUR NEGATIVE 08/27/2014 1602   KETONESUR NEGATIVE 08/27/2014 1602   PROTEINUR 30 (A) 08/27/2014 1602   UROBILINOGEN 0.2 08/27/2014 1602   NITRITE NEGATIVE 08/27/2014 1602   LEUKOCYTESUR SMALL (A) 08/27/2014 1602   Sepsis Labs: @LABRCNTIP (procalcitonin:4,lacticidven:4) )No results found for this or any previous visit (from the past 240 hour(s)).   Radiological Exams on Admission: Dg Chest 2 View  Result Date: 03/20/2016 CLINICAL DATA:  Shortness of breath and abdominal swelling for 3 days. Chronic lymphedema. High blood pressure. EXAM: CHEST  2 VIEW COMPARISON:  12/26/2015 FINDINGS: Shallow inspiration. Borderline heart size without vascular congestion or edema. No focal consolidation or airspace disease. No blunting of costophrenic angles. No  pneumothorax. Calcified granuloma in the right lung. IMPRESSION: Cardiac enlargement.  No evidence of active pulmonary disease. Electronically Signed   By: Burman Nieves M.D.   On: 03/20/2016 01:39    EKG: Independently reviewed. Dennis tachycardia low voltage.  Assessment/Plan Principal Problem:   Acute respiratory failure with hypoxia (HCC) Active Problems:   Venous stasis ulcer (HCC)   Obesity   Chronic acquired lymphedema   SOB (shortness of breath)   Hypertensive urgency   Abdominal pain   OSA (obstructive sleep apnea)    1. Acute respiratory failure with hypoxia - cause not clear but suspect patient probably would be having CHF. Patient's uncontrolled blood pressure could also be contributing. I have ordered 1 dose of Lasix 40 mg IV and based on response further doses can be ordered check 2-D echo. I have also ordered CT angiogram of the chest. 2. Abdominal discomfort with erythema - suspect patient may be developing abdominal wall cellulitis. I have placed patient on doxycycline. Check  CT abdomen. 3. Hypertensive urgency - continue lisinopril hydrochlorothiazide. I have placed patient on when necessary IV hydralazine. Patient also received 1 dose of Lasix. Closely follow blood pressure trends and adjust medications accordingly. 4. Chronic wound of the right lower extremity. 5. Morbid obesity.   DVT prophylaxis: Lovenox. Code Status: Full code.  Family Communication: Patient's mother.  Disposition Plan: Home.  Consults called: None.  Admission status: Observation.    Eduard Clos MD [facility] Pager [phone].  If 7PM-7AM, please contact night-coverage [email or web address] Password [email or web address]  03/20/2016, 5:13 AM

## 2016-03-20 NOTE — ED Triage Notes (Signed)
Pt states that he has been increasing swelling over the past week, his abdominal is distended and hard and he has swelling in bilateral lower extremities

## 2016-03-20 NOTE — Progress Notes (Signed)
Patient has his home CPAP bedside and ready for use. Patient said he would place on when ready for bed and did not need any assistance.

## 2016-03-20 NOTE — Progress Notes (Signed)
Lovenox per Pharmacy for DVT Prophylaxis    Pharmacy has been consulted from dosing enoxaparin (lovenox) in this patient for DVT prophylaxis.  The pharmacist has reviewed pertinent labs (Hgb _11.2__; PLT__240_), patient weight (_286.6__kg) and renal function (CrCl_>199_mL/min) and decided that enoxaparin  100__mg SQ Q_24_Hrs is appropriate for this patient.  The pharmacy department will sign off at this time.  Please reconsult pharmacy if status changes or for further issues.  Thank you  Luetta NuttingJulian Crowford Baileigh Modisette, Jr PharmD, BCPS  03/20/2016, 5:24 AM

## 2016-03-20 NOTE — ED Provider Notes (Signed)
[facility]-EMERGENCY DEPT Provider Note   CSN: [id number] Arrival date & time: 03/20/16  0033 By signing my name below, I, [name], attest that this documentation has been prepared under the direction and in the presence of Shawn CrumbleAdeleke Dyasia Firestine, MD. Electronically Signed: Bridgette HabermannMaria [name], ED Scribe. 03/20/16. 1:12 AM.  History   Chief Complaint Chief Complaint  Patient presents with  . Lymphadenopathy   The history is provided by the patient. No language interpreter was used.   HPI Comments: [name] is a 36 y.o. male with h/o chronic lymphedema, shortness of breath, and HTN, who presents to the Emergency Department complaining of gradually worsening, increased swelling in his abdomen and BLEs onset one week ago. Pt also has associated shortness of breath. Pt has h/o chronic lymphedema but notes that his symptoms at this time are significantly worse. Pt has hypertension and is regularly on Lisinopril 20-25mg  but reports he has been out. Denies h/o cardiac or lung disease. Pt denies fever, chills, or any other associated symptoms.   Past Medical History:  Diagnosis Date  . Hypertension   . Obesity     Patient Active Problem List   Diagnosis Date Noted  . SOB (shortness of breath) 03/20/2016  . Acute respiratory failure with hypoxia (HCC) 03/20/2016  . Hypertensive urgency 03/20/2016  . Abdominal pain 03/20/2016  . OSA (obstructive sleep apnea) 03/20/2016  . Type 2 diabetes mellitus (HCC) 12/29/2015  . Wound infection   . Hyperglycemia   . Cellulitis 12/26/2015  . Chronic acquired lymphedema 12/26/2015  . Shortness of breath   . Venous stasis ulcer (HCC) 01/09/2011  . Hypertension 01/09/2011  . Obesity 01/09/2011    Past Surgical History:  Procedure Laterality Date  . balloon stenting Right    right leg  . VEIN LIGATION AND STRIPPING         Home Medications    Prior to Admission medications   Medication Sig Start Date End Date Taking? Authorizing Provider  ibuprofen  (ADVIL,MOTRIN) 200 MG tablet Take 800 mg by mouth every 6 (six) hours as needed for moderate pain.   Yes Historical Provider, MD  lisinopril-hydrochlorothiazide (PRINZIDE,ZESTORETIC) 20-25 MG tablet Take 1 tablet by mouth daily.  12/21/15  Yes Historical Provider, MD    Family History Family History  Problem Relation Age of Onset  . Diabetes Mother   . Cancer Father     colon  . Cancer Maternal Grandmother   . Cancer Maternal Grandfather     Social History Social History  Substance Use Topics  . Smoking status: Never Smoker  . Smokeless tobacco: Never Used  . Alcohol use 2.0 oz/week    4 Standard drinks or equivalent per week     Comment: liquor - pt states once a week     Allergies   Lidocaine and Peanut-containing drug products   Review of Systems Review of Systems 10 Systems reviewed and all are negative for acute change except as noted in the HPI. Physical Exam Updated Vital Signs BP (!) 162/104   Pulse 105   Temp 99.1 F (37.3 C) (Oral)   Resp (!) 9   Wt (!) 631 lb 14.4 oz (286.6 kg)   SpO2 91%   BMI 78.98 kg/m   Physical Exam  Constitutional: He is oriented to person, place, and time. Vital signs are normal. He appears well-developed and well-nourished.  Non-toxic appearance. He does not appear ill. No distress.  HENT:  Head: Normocephalic and atraumatic.  Nose: Nose normal.  Mouth/Throat: Oropharynx  is clear and moist. No oropharyngeal exudate.  Eyes: Conjunctivae and EOM are normal. Pupils are equal, round, and reactive to light. No scleral icterus.  Neck: Normal range of motion. Neck supple. No tracheal deviation, no edema, no erythema and normal range of motion present. No thyroid mass and no thyromegaly present.  Cardiovascular: Normal rate, regular rhythm, S1 normal, S2 normal, normal heart sounds, intact distal pulses and normal pulses.  Exam reveals no gallop and no friction rub.   No murmur heard. Distant.  Pulmonary/Chest: Effort normal and  breath sounds normal. No respiratory distress. He has no wheezes. He has no rhonchi. He has no rales.  Distant.  Abdominal: Soft. Normal appearance and bowel sounds are normal. He exhibits no distension, no ascites and no mass. There is no hepatosplenomegaly. There is no tenderness. There is no rebound, no guarding and no CVA tenderness.  Musculoskeletal: Normal range of motion. He exhibits edema. He exhibits no tenderness.  Lymphedema BLEs.  Neurological: He is alert and oriented to person, place, and time. He has normal strength. No cranial nerve deficit or sensory deficit.  Skin: Skin is warm, dry and intact. No petechiae and no rash noted. He is not diaphoretic. No erythema. No pallor.  Nursing note and vitals reviewed.    ED Treatments / Results  DIAGNOSTIC STUDIES: Oxygen Saturation is 96% on RA, adequate by my interpretation.    COORDINATION OF CARE: 1:08 AM Discussed treatment plan with pt at bedside which includes IV fluids and blood work and pt agreed to plan.  Labs (all labs ordered are listed, but only abnormal results are displayed) Labs Reviewed  CBC WITH DIFFERENTIAL/PLATELET - Abnormal; Notable for the following:       Result Value   Hemoglobin 11.2 (*)    HCT 34.9 (*)    MCV 76.9 (*)    MCH 24.7 (*)    RDW 17.7 (*)    All other components within normal limits  BASIC METABOLIC PANEL - Abnormal; Notable for the following:    Potassium 3.4 (*)    Glucose, Bld 104 (*)    All other components within normal limits  BRAIN NATRIURETIC PEPTIDE - Abnormal; Notable for the following:    B Natriuretic Peptide 171.0 (*)    All other components within normal limits  CBC WITH DIFFERENTIAL/PLATELET - Abnormal; Notable for the following:    Hemoglobin 10.8 (*)    HCT 34.0 (*)    MCV 76.7 (*)    MCH 24.4 (*)    RDW 17.8 (*)    All other components within normal limits  MAGNESIUM  TROPONIN I  TROPONIN I  TROPONIN I  CBC  COMPREHENSIVE METABOLIC PANEL  I-STAT TROPOININ,  ED    EKG  EKG Interpretation  Date/Time:  Wednesday March 20 2016 00:46:01 EST Ventricular Rate:  103 PR Interval:    QRS Duration: 85 QT Interval:  358 QTC Calculation: 469 R Axis:   52 Text Interpretation:  Sinus tachycardia Anterior infarct, old Nonspecific T abnormalities, lateral leads tachycardia is new Confirmed by Erroll Luna [phone]) on 03/20/2016 1:03:05 AM       Radiology Dg Chest 2 View  Result Date: 03/20/2016 CLINICAL DATA:  Shortness of breath and abdominal swelling for 3 days. Chronic lymphedema. High blood pressure. EXAM: CHEST  2 VIEW COMPARISON:  12/26/2015 FINDINGS: Shallow inspiration. Borderline heart size without vascular congestion or edema. No focal consolidation or airspace disease. No blunting of costophrenic angles. No pneumothorax. Calcified granuloma in the right  lung. IMPRESSION: Cardiac enlargement.  No evidence of active pulmonary disease. Electronically Signed   By: Burman Nieves M.D.   On: 03/20/2016 01:39    Procedures Procedures (including critical care time)  Medications Ordered in ED Medications  hydrALAZINE (APRESOLINE) injection 10 mg (10 mg Intravenous Given 03/20/16 0543)  doxycycline (VIBRAMYCIN) 100 mg in dextrose 5 % 250 mL IVPB (not administered)  acetaminophen (TYLENOL) tablet 650 mg (not administered)    Or  acetaminophen (TYLENOL) suppository 650 mg (not administered)  ondansetron (ZOFRAN) tablet 4 mg (not administered)    Or  ondansetron (ZOFRAN) injection 4 mg (not administered)  enoxaparin (LOVENOX) injection 100 mg (not administered)  lisinopril (PRINIVIL,ZESTRIL) tablet 20 mg (not administered)  hydrochlorothiazide (HYDRODIURIL) tablet 25 mg (not administered)  lisinopril (PRINIVIL,ZESTRIL) tablet 20 mg (20 mg Oral Given 03/20/16 0205)  hydrochlorothiazide (MICROZIDE) capsule 25 mg (25 mg Oral Given 03/20/16 0205)  potassium chloride SA (K-DUR,KLOR-CON) CR tablet 40 mEq (40 mEq Oral Given 03/20/16 0542)    furosemide (LASIX) injection 40 mg (40 mg Intravenous Given 03/20/16 0543)     Initial Impression / Assessment and Plan / ED Course  I have reviewed the triage vital signs and the nursing notes.  Pertinent labs & imaging results that were available during my care of the patient were reviewed by me and considered in my medical decision making (see chart for details).  Clinical Course     Patient presents to the ED For worsening shortness of breath. His body habitus makes it very difficult to examine him. Labs are unremarkable, chest x-ray does not reveal any cause for his difficulty breathing. Patient may require diuresis, spoke with Dr. Toniann Fail who also agrees with admission for further evaluation. Patient continues to be stable, 1 minute for further care.  Final Clinical Impressions(s) / ED Diagnoses   Final diagnoses:  SOB (shortness of breath)    New Prescriptions Current Discharge Medication List        I personally performed the services described in this documentation, which was scribed in my presence. The recorded information has been reviewed and is accurate.       Shawn Crumble, MD 03/20/16 [phone]

## 2016-03-20 NOTE — Progress Notes (Signed)
**  Preliminary report by tech**  Bilateral lower extremity venous duplex complete. Technically limited study due to patient body habitus, patient pain tolerance, edema, movement, depth of vessels, poor patient cooperation, wound bandages, and acoustic shadowing. Unable to visualize the femoral, profunda femoral, posterior tibial, and peroneal veins of the right lower extremity. Unable to visualize the profunda femoral, posterior tibial, and peroneal veins of the left lower extremity. There is no obvious evidence of deep or superficial vein thrombosis involving the right and left lower extremities. There is no evidence of a Baker's cyst bilaterally. Results were given to the patient's nurse, Ikrame.  03/20/16 10:59 AM Olen CordialGreg Meenakshi Sazama RVT

## 2016-03-21 ENCOUNTER — Encounter (HOSPITAL_COMMUNITY): Payer: Self-pay | Admitting: Physician Assistant

## 2016-03-21 ENCOUNTER — Observation Stay (HOSPITAL_BASED_OUTPATIENT_CLINIC_OR_DEPARTMENT_OTHER): Payer: BLUE CROSS/BLUE SHIELD

## 2016-03-21 DIAGNOSIS — I83009 Varicose veins of unspecified lower extremity with ulcer of unspecified site: Secondary | ICD-10-CM | POA: Diagnosis present

## 2016-03-21 DIAGNOSIS — I110 Hypertensive heart disease with heart failure: Secondary | ICD-10-CM | POA: Diagnosis present

## 2016-03-21 DIAGNOSIS — R0600 Dyspnea, unspecified: Secondary | ICD-10-CM | POA: Diagnosis not present

## 2016-03-21 DIAGNOSIS — I509 Heart failure, unspecified: Secondary | ICD-10-CM | POA: Diagnosis not present

## 2016-03-21 DIAGNOSIS — I160 Hypertensive urgency: Secondary | ICD-10-CM

## 2016-03-21 DIAGNOSIS — R1030 Lower abdominal pain, unspecified: Secondary | ICD-10-CM | POA: Diagnosis not present

## 2016-03-21 DIAGNOSIS — I472 Ventricular tachycardia: Secondary | ICD-10-CM | POA: Diagnosis not present

## 2016-03-21 DIAGNOSIS — Z79891 Long term (current) use of opiate analgesic: Secondary | ICD-10-CM | POA: Diagnosis not present

## 2016-03-21 DIAGNOSIS — L03311 Cellulitis of abdominal wall: Secondary | ICD-10-CM | POA: Diagnosis present

## 2016-03-21 DIAGNOSIS — G4733 Obstructive sleep apnea (adult) (pediatric): Secondary | ICD-10-CM | POA: Diagnosis not present

## 2016-03-21 DIAGNOSIS — I872 Venous insufficiency (chronic) (peripheral): Secondary | ICD-10-CM | POA: Diagnosis not present

## 2016-03-21 DIAGNOSIS — J9601 Acute respiratory failure with hypoxia: Secondary | ICD-10-CM | POA: Diagnosis not present

## 2016-03-21 DIAGNOSIS — J9600 Acute respiratory failure, unspecified whether with hypoxia or hypercapnia: Secondary | ICD-10-CM | POA: Diagnosis not present

## 2016-03-21 DIAGNOSIS — I890 Lymphedema, not elsewhere classified: Secondary | ICD-10-CM | POA: Diagnosis not present

## 2016-03-21 DIAGNOSIS — Z6841 Body Mass Index (BMI) 40.0 and over, adult: Secondary | ICD-10-CM | POA: Diagnosis not present

## 2016-03-21 DIAGNOSIS — I878 Other specified disorders of veins: Secondary | ICD-10-CM

## 2016-03-21 DIAGNOSIS — R0602 Shortness of breath: Secondary | ICD-10-CM | POA: Diagnosis not present

## 2016-03-21 DIAGNOSIS — D649 Anemia, unspecified: Secondary | ICD-10-CM | POA: Diagnosis present

## 2016-03-21 DIAGNOSIS — E8770 Fluid overload, unspecified: Secondary | ICD-10-CM

## 2016-03-21 DIAGNOSIS — E669 Obesity, unspecified: Secondary | ICD-10-CM | POA: Diagnosis not present

## 2016-03-21 DIAGNOSIS — E876 Hypokalemia: Secondary | ICD-10-CM | POA: Diagnosis not present

## 2016-03-21 DIAGNOSIS — I5023 Acute on chronic systolic (congestive) heart failure: Secondary | ICD-10-CM | POA: Diagnosis not present

## 2016-03-21 DIAGNOSIS — E119 Type 2 diabetes mellitus without complications: Secondary | ICD-10-CM | POA: Diagnosis not present

## 2016-03-21 DIAGNOSIS — R601 Generalized edema: Secondary | ICD-10-CM | POA: Diagnosis not present

## 2016-03-21 LAB — BASIC METABOLIC PANEL
Anion gap: 8 (ref 5–15)
BUN: 12 mg/dL (ref 6–20)
CALCIUM: 9.2 mg/dL (ref 8.9–10.3)
CO2: 31 mmol/L (ref 22–32)
CREATININE: 0.92 mg/dL (ref 0.61–1.24)
Chloride: 99 mmol/L — ABNORMAL LOW (ref 101–111)
GLUCOSE: 106 mg/dL — AB (ref 65–99)
Potassium: 3.8 mmol/L (ref 3.5–5.1)
Sodium: 138 mmol/L (ref 135–145)

## 2016-03-21 LAB — ECHOCARDIOGRAM COMPLETE: WEIGHTICAEL: 9648 [oz_av]

## 2016-03-21 LAB — MAGNESIUM: Magnesium: 1.9 mg/dL (ref 1.7–2.4)

## 2016-03-21 MED ORDER — PERFLUTREN LIPID MICROSPHERE
INTRAVENOUS | Status: AC
  Filled 2016-03-21: qty 10

## 2016-03-21 MED ORDER — PERFLUTREN LIPID MICROSPHERE
1.0000 mL | INTRAVENOUS | Status: AC | PRN
  Administered 2016-03-21: 2 mL via INTRAVENOUS
  Filled 2016-03-21: qty 10

## 2016-03-21 MED ORDER — FUROSEMIDE 10 MG/ML IJ SOLN
40.0000 mg | Freq: Three times a day (TID) | INTRAMUSCULAR | Status: DC
  Administered 2016-03-21 – 2016-03-26 (×15): 40 mg via INTRAVENOUS
  Filled 2016-03-21 (×16): qty 4

## 2016-03-21 MED ORDER — FUROSEMIDE 10 MG/ML IJ SOLN: 40.0000 mg | Freq: Two times a day (BID) | INTRAMUSCULAR | Status: DC

## 2016-03-21 MED ORDER — POTASSIUM CHLORIDE CRYS ER 20 MEQ PO TBCR
40.0000 meq | EXTENDED_RELEASE_TABLET | Freq: Every day | ORAL | Status: DC
  Administered 2016-03-21: 40 meq via ORAL
  Filled 2016-03-21: qty 2

## 2016-03-21 NOTE — Consult Note (Signed)
Cardiology Consultation Note    Patient ID: [name], MRN: [id number], DOB/AGE: 36-Dec-1982 35 y.o. Admit date: 03/20/2016   Date of Consult: 03/21/2016 Primary Physician: Lupe Carney, MD Primary Cardiologist: New to Dr. Herbie Baltimore  Chief Complaint: "swelling all over" Reason for Consultation: ?CHF Requesting MD: Dr. Benjamine Mola  HPI: [name] is a 36 y.o. male with history of chronic venous insufficiency, OSA on CPAP, morbid obesity, hypertension, lymphedema whom we are asked to see for volume overload. His mother Hart Carwin states that he was born at 26 weeks weighing less than 2 lbs and had some sort of venous complication as a preemie. He developed thrombosis and required a venous procedure. Later in life he went onto have vein bypass in [place] around 2005. In high school he played football and weighed around 300-400lbs. He has subsequently been followed by [facility] for his chronic venous insufficiency and was recently informed he has lymphedema as well. He feels like over the last 2 months he has gained approximately 84lbs of fluid. His legs got so tight to the point where he could barely bend his legs. He also noticed he was more winded than usual doing activities. He has not had any chest pain, syncope, orthopnea or fevers. Due to persistent sx he presented to the [facility] where he was found to have BP 222/122. Troponins 0.09-0.08-0.08. Cr 1.13. BNP 171. Hgb 10-11 this admission. CTs this admission with inguinal lymphadenopathy, chronic venous collaterals of pelvis, and no central PE. Mild cardiomegaly is new since 2013. No pericardial effusion. 2D echocardiogram uninterpretable. Weights in Epic 631->609. Prior weights in 2015 were in the 490 range, they were 522 in 12/2015. He is on IV Lasix and reports he has begun urinating profusely. He states he went 32 times yesterday. Reports compliance with CPAP.  He is also being treated for possible abdominal wall cellulitis.  He states he walks 1 mile 3-4x  a week. He has begun early process of proceeding with bariatric w/u; was planning to attend nutrition seminar. He states he only eats 1-2 meals a day but drinks over 2L of soda per day.     Past Medical History:  Diagnosis Date  . Chronic venous insufficiency    a. as a premature infant, required venous access for care with subsequent DVT and some sort of procedure -> eventually went on to have vein bypass around 2005.  [name] Hypertension   . Lymphedema   . Morbid obesity (HCC)   . OSA on CPAP       Surgical History:  Past Surgical History:  Procedure Laterality Date  . balloon stenting Right    right leg  . VEIN LIGATION AND STRIPPING       Home Meds: Prior to Admission medications   Medication Sig Start Date End Date Taking? Authorizing Provider  ibuprofen (ADVIL,MOTRIN) 200 MG tablet Take 800 mg by mouth every 6 (six) hours as needed for moderate pain.   Yes Historical Provider, MD  lisinopril-hydrochlorothiazide (PRINZIDE,ZESTORETIC) 20-25 MG tablet Take 1 tablet by mouth daily.  12/21/15  Yes Historical Provider, MD    Inpatient Medications:  . doxycycline (VIBRAMYCIN) IV  100 mg Intravenous Q12H  . enoxaparin (LOVENOX) injection  100 mg Subcutaneous Q24H  . furosemide  40 mg Intravenous BID  . lisinopril  20 mg Oral Daily  . potassium chloride  40 mEq Oral Daily     Allergies:  Allergies  Allergen Reactions  . Lidocaine Other (See Comments)    "burns  my skin"  . Peanut-Containing Drug Products Swelling    Social History   Social History  . Marital status: Single    Spouse name: N/A  . Number of children: N/A  . Years of education: N/A   Occupational History  . Not on file.   Social History Main Topics  . Smoking status: Never Smoker  . Smokeless tobacco: Never Used  . Alcohol use 2.0 oz/week    4 Standard drinks or equivalent per week     Comment: liquor - pt states once a week  . Drug use: No  . Sexual activity: Not on file   Other Topics Concern    . Not on file   Social History Narrative  . No narrative on file     Family History  Problem Relation Age of Onset  . Diabetes Mother   . Cancer Father     colon  . Cancer Maternal Grandmother   . Cancer Maternal Grandfather      Review of Systems: No fevers, chills, chest pain. All other systems reviewed and are otherwise negative except as noted above.  Labs:  Recent Labs  03/20/16 0556 03/20/16 1214 03/20/16 1723  TROPONINI 0.09* 0.08* 0.08*   Lab Results  Component Value Date   WBC 7.2 03/20/2016   HGB 10.8 (L) 03/20/2016   HCT 34.0 (L) 03/20/2016   MCV 76.7 (L) 03/20/2016   PLT 224 03/20/2016     Recent Labs Lab 03/20/16 0556  NA 140  K 3.1*  CL 103  CO2 26  BUN 19  CREATININE 0.98  CALCIUM 9.0  PROT 7.5  BILITOT 0.8  ALKPHOS 58  ALT 22  AST 32  GLUCOSE 100*    Radiology/Studies:  Dg Chest 2 View  Result Date: 03/20/2016 CLINICAL DATA:  Shortness of breath and abdominal swelling for 3 days. Chronic lymphedema. High blood pressure. EXAM: CHEST  2 VIEW COMPARISON:  12/26/2015 FINDINGS: Shallow inspiration. Borderline heart size without vascular congestion or edema. No focal consolidation or airspace disease. No blunting of costophrenic angles. No pneumothorax. Calcified granuloma in the right lung. IMPRESSION: Cardiac enlargement.  No evidence of active pulmonary disease. Electronically Signed   By: Burman Nieves M.D.   On: 03/20/2016 01:39   Ct Angio Chest Pe W Or Wo Contrast  Result Date: 03/20/2016 CLINICAL DATA:  36 year old male with super morbid obesity (631 pounds). Increasing shortness of breath. Increasing weight and abdominal girth recently. Hypertensive at presentation. Initial encounter. EXAM: CT ANGIOGRAPHY CHEST CT ABDOMEN AND PELVIS WITH CONTRAST TECHNIQUE: Multidetector CT imaging of the chest was performed using the standard protocol during bolus administration of intravenous contrast. Multiplanar CT image reconstructions and MIPs  were obtained to evaluate the vascular anatomy. Multidetector CT imaging of the abdomen and pelvis was performed using the standard protocol during bolus administration of intravenous contrast. CONTRAST:  100 mL Isovue 370 COMPARISON:  CT Abdomen and Pelvis 11/05/2011, 05/09/2009 FINDINGS: CTA CHEST FINDINGS Cardiovascular: Suboptimal contrast bolus timing in the pulmonary arterial tree. Quantum mottle artifact. Mild respiratory motion artifact. No central pulmonary embolus. The lobar and distal pulmonary arteries are not well evaluated. Mild cardiomegaly is new since 2013. No pericardial effusion. Negative thoracic aorta. Mediastinum/Nodes: No lymphadenopathy. Lungs/Pleura: Major airways are patent except for atelectatic changes. There is a calcified granuloma in the right upper lobe. Lower lung volumes compared to 2013 with crowding of lung markings but no abnormal pulmonary opacity or pleural effusion. Musculoskeletal: No acute osseous abnormality identified. Review of the MIP  images confirms the above findings. CT ABDOMEN and PELVIS FINDINGS Hepatobiliary: The liver appears stable and within normal limits. Gallbladder detail is obscured, but the gallbladder is not abnormally dilated. Pancreas: The pancreatic tail appears within normal limits, the body and head of the pancreas are obscured by quantum mottle. Spleen: The spleen appears stable and within normal limits. Adrenals/Urinary Tract: No hydronephrosis. On delayed images there is bilateral renal excretion of IV contrast. The urinary bladder is distended but otherwise appears normal. Stomach/Bowel: Negative rectum. The splenic flexure and distal transverse colon appear within normal limits. Proximal small bowel loops and the hepatic flexure appear within normal limits on the delayed excretory images. Other small and large bowel loops are obscured by quantum mottle. Negative proximal stomach. Vascular/Lymphatic: Numerous chronic venous collaterals about both  inguinal regions and the lower abdominal panniculus. Chronic right inguinal postoperative changes with numerous surgical clips, stable since 2013. Quantum mottle degrades detail of the abdominal aorta and iliac arteries, the abdominal aorta and proximal common iliac arteries appear within normal limits on the delayed excretory phase images. Chronically enlarged bilateral inguinal lymph nodes appear not significantly changed since 2013. There is evidence of right greater than left external iliac artery lymphadenopathy which has increased since 2013 with individual nodes measuring up to 27 mm short axis (previously 18 mm). The mesenteric and retroperitoneal spaces in the abdomen and pelvis are poorly visualized due to quantum mottle. It is difficult to exclude retroperitoneal lymphadenopathy in the upper abdomen. Reproductive: Negative. Other: No pelvic free fluid. Musculoskeletal: Lumbar and pelvic bone detail degraded by body habitus/quantum mottle. No acute osseous abnormality identified. Review of the MIP images confirms the above findings. IMPRESSION: Limited diagnostic information, especially in the abdomen, related to large patient size. The following conclusions can be made: - No central pulmonary embolus, with lobar and distal pulmonary arteries not well evaluated. - Negative aorta. Mild cardiomegaly is new since 2013. No pericardial effusion. - Pulmonary atelectasis, but no other lung abnormality or or pleural effusion. - No renal obstruction. - Chronic inguinal lymphadenopathy appears stable since 2013 although right external iliac artery adenopathy has progressed and is now moderate to severe (individual nodes up to 27 mm short axis). This is nonspecific. Sequelae of chronic infection/inflammation seems more likely than a systemic lymphoproliferative disorder or Lymphoma considering lack of thoracic and no definite upper abdominal lymphadenopathy. - Chronic venous collaterals about the pelvis, stable since  2013. Electronically Signed   By: Odessa FlemingH  Hall M.D.   On: 03/20/2016 13:56   Ct Abdomen Pelvis W Contrast  Result Date: 03/20/2016 CLINICAL DATA:  36 year old male with super morbid obesity (631 pounds). Increasing shortness of breath. Increasing weight and abdominal girth recently. Hypertensive at presentation. Initial encounter. EXAM: CT ANGIOGRAPHY CHEST CT ABDOMEN AND PELVIS WITH CONTRAST TECHNIQUE: Multidetector CT imaging of the chest was performed using the standard protocol during bolus administration of intravenous contrast. Multiplanar CT image reconstructions and MIPs were obtained to evaluate the vascular anatomy. Multidetector CT imaging of the abdomen and pelvis was performed using the standard protocol during bolus administration of intravenous contrast. CONTRAST:  100 mL Isovue 370 COMPARISON:  CT Abdomen and Pelvis 11/05/2011, 05/09/2009 FINDINGS: CTA CHEST FINDINGS Cardiovascular: Suboptimal contrast bolus timing in the pulmonary arterial tree. Quantum mottle artifact. Mild respiratory motion artifact. No central pulmonary embolus. The lobar and distal pulmonary arteries are not well evaluated. Mild cardiomegaly is new since 2013. No pericardial effusion. Negative thoracic aorta. Mediastinum/Nodes: No lymphadenopathy. Lungs/Pleura: Major airways are patent except for atelectatic  changes. There is a calcified granuloma in the right upper lobe. Lower lung volumes compared to 2013 with crowding of lung markings but no abnormal pulmonary opacity or pleural effusion. Musculoskeletal: No acute osseous abnormality identified. Review of the MIP images confirms the above findings. CT ABDOMEN and PELVIS FINDINGS Hepatobiliary: The liver appears stable and within normal limits. Gallbladder detail is obscured, but the gallbladder is not abnormally dilated. Pancreas: The pancreatic tail appears within normal limits, the body and head of the pancreas are obscured by quantum mottle. Spleen: The spleen appears  stable and within normal limits. Adrenals/Urinary Tract: No hydronephrosis. On delayed images there is bilateral renal excretion of IV contrast. The urinary bladder is distended but otherwise appears normal. Stomach/Bowel: Negative rectum. The splenic flexure and distal transverse colon appear within normal limits. Proximal small bowel loops and the hepatic flexure appear within normal limits on the delayed excretory images. Other small and large bowel loops are obscured by quantum mottle. Negative proximal stomach. Vascular/Lymphatic: Numerous chronic venous collaterals about both inguinal regions and the lower abdominal panniculus. Chronic right inguinal postoperative changes with numerous surgical clips, stable since 2013. Quantum mottle degrades detail of the abdominal aorta and iliac arteries, the abdominal aorta and proximal common iliac arteries appear within normal limits on the delayed excretory phase images. Chronically enlarged bilateral inguinal lymph nodes appear not significantly changed since 2013. There is evidence of right greater than left external iliac artery lymphadenopathy which has increased since 2013 with individual nodes measuring up to 27 mm short axis (previously 18 mm). The mesenteric and retroperitoneal spaces in the abdomen and pelvis are poorly visualized due to quantum mottle. It is difficult to exclude retroperitoneal lymphadenopathy in the upper abdomen. Reproductive: Negative. Other: No pelvic free fluid. Musculoskeletal: Lumbar and pelvic bone detail degraded by body habitus/quantum mottle. No acute osseous abnormality identified. Review of the MIP images confirms the above findings. IMPRESSION: Limited diagnostic information, especially in the abdomen, related to large patient size. The following conclusions can be made: - No central pulmonary embolus, with lobar and distal pulmonary arteries not well evaluated. - Negative aorta. Mild cardiomegaly is new since 2013. No  pericardial effusion. - Pulmonary atelectasis, but no other lung abnormality or or pleural effusion. - No renal obstruction. - Chronic inguinal lymphadenopathy appears stable since 2013 although right external iliac artery adenopathy has progressed and is now moderate to severe (individual nodes up to 27 mm short axis). This is nonspecific. Sequelae of chronic infection/inflammation seems more likely than a systemic lymphoproliferative disorder or Lymphoma considering lack of thoracic and no definite upper abdominal lymphadenopathy. - Chronic venous collaterals about the pelvis, stable since 2013. Electronically Signed   By: Odessa Fleming M.D.   On: 03/20/2016 13:56    Wt Readings from Last 3 Encounters:  03/21/16 (!) 603 lb (273.5 kg)  12/26/15 (!) 527 lb (239 kg)  01/09/11 (!) 475 lb (215.5 kg)    EKG: sinus tachycardia 103bpm, low voltage, nonspecific ST-T changes  Physical Exam: Blood pressure (!) 155/95, pulse (!) 106, temperature 97.7 F (36.5 C), temperature source Oral, resp. rate 18, weight (!) 603 lb (273.5 kg), SpO2 100 %. Body mass index is 75.37 kg/m. General: Morbidly obese AAM in no acute distress. Head: Normocephalic, atraumatic, sclera non-icteric, no xanthomas, nares are without discharge.  Neck: JVD difficult to assess, supple. Lungs: Clear bilaterally to auscultation without wheezes, rales, or rhonchi. Breathing is unlabored. Heart: RRR borderline elevated rate with S1 S2. No murmurs, rubs, or gallops appreciated.  Abdomen: Soft, non-tender, non-distended with normoactive bowel sounds. No hepatomegaly. No rebound/guarding. No obvious abdominal masses. Msk:  Strength and tone appear normal for age. Extremities: No clubbing or cyanosis. Diffuse edema, difficult to differentiate edema versus body habitus Neuro: Alert and oriented X 3. No facial asymmetry. No focal deficit. Moves all extremities spontaneously. Psych:  Responds to questions appropriately with a normal affect.      Assessment and Plan  52M with chronic venous insufficiency s/p venous bypass, morbid obesity, hypertension, lymphedema admitted with progressive swelling, SOB, and progressive weight gain (previously 520's in 12/2015).  1. Volume overload - unclear if due to congestive heart failure or compressive lymphedema. Agree with continued diuretic therapy and treatment of hypertension - will further titrate Lasix to TID and follow response. More than likely will need to follow BUN/Cr and CO to guide endpoint of diuresis as his volume is almost impossible to assess given his habitus. Wrote for stat BMET/Mg to reassess hypokalemia from yesterday. Signed out to PA on call this evening to look out for labs, wrote nurse care order to page cardiology when these are back for review.  2. Essential HTN/hypertensive urgency - follow with diuresis.  3. Morbid obesity - life-threatening at this point. Strongly encourage continued f/u with bariatric surgery.  4. Anemia - per IM.  Signed, Laurann Montana PA-C 03/21/2016, 4:33 PM Pager: [phone]  I have seen, examined and evaluated the patient this PM along with Dayna Dunn,PA-C.  After reviewing all the available data and chart, we discussed the patients laboratory, study & physical findings as well as symptoms in detail. I agree with her findings, examination as well as impression recommendations as per our discussion.    Heart the tell if this is true heart failure or not, unfortunately the echocardiogram was totally unhelpful given his body habitus. He has diffuse body edema which is probably, lymphedema and venous stasis along with obesity hypoventilation syndrome. Interestingly he doesn't have much the way of orthopnea or PND which would be suggestive of left heart failure.  He is clearly volume overloaded to the tune of maybe 70+ pounds and will just simply require aggressive diuresis. I've increased his Lasix to 40 3 times a day. His blood pressures also  extremely high and needs to be managed, to see how it does with some diuresis as I think a lot of it is due to volume overload.  Thankfully with just the first day of diuresis he or he feels a little better with more mobility of his legs and less tense edema. His breathing is improved as well.  This will be a difficult, Schwieger course of diuresis with blood pressure management. But ultimately he has super morbid obesity that is to the point of life-threatening, and seriously need to consider weight loss options including possible bariatric surgery.  On top of this he has anemia which is certainly contributing to his dyspnea. Will defer this to internal medicine.  We will follow along to assist.    Bryan Lemma, M.D., M.S. Interventional Cardiologist   Pager # [phone] Phone # [phone] [address] [place], [place] [address]

## 2016-03-21 NOTE — Progress Notes (Addendum)
PROGRESS NOTE    [name]  ZOX:[id number] DOB: June 06, 1980 DOA: 03/20/2016 PCP: Lupe Carneyean Mitchell, MD   Outpatient Specialists:     Brief Narrative:  [name] is a 36 y.o. male with history of super morbid obesity, hypertension, sleep apnea presents to the ER because of increasing shortness of breath over the last few days. Patient states he may have gained at least 70 pounds in the last 1 month. Shortness of breath is particularly increased on exertion. Denies any chest pain, productive cough fever or chills. Patient also noticed increasing swelling in his lower extremity. His abdominal girth also has increased. Complains of increasing pain around the left side of his abdomen wall. Denies any fall or trauma. On exam patient is not in distress. Chest x-ray shows cardiomegaly. Abdomen appears slightly erythematous on the left side. Patient has chronic wound on the right lower extremity. At presentation patient's blood pressure is also markedly elevated.    Assessment & Plan:   Principal Problem:   Acute respiratory failure with hypoxia (HCC) Active Problems:   Venous stasis ulcer (HCC)   Obesity   Chronic acquired lymphedema   SOB (shortness of breath)   Hypertensive urgency   Abdominal pain   OSA (obstructive sleep apnea)  Acute respiratory failure with hypoxia  -cause not clear  -suspect patient has CHF. - 2-D echo-- unable to read due to size-- cardiomegaly on scan-- will consult cardiology for recommendations, will continue IV lasix with daily BMP -CTA negative for PE -improving with IV lasix-- weight down  Abdominal discomfort with erythema -  -doxycycline -CT abd  Hypertensive urgency -  -continue lisinopril  -IV lasix for now  Chronic wound of the right lower extremity -[facility]  Morbid obesity. -patient is making lifestyle changes  Hypokalemia -replace -d/c HCTZ  DVT prophylaxis:  Lovenox   Code Status: Full Code   Family  Communication: Mom at bedside  Disposition Plan:     Consultants:      Subjective: Breathing continues to improve  Objective: Vitals:   03/20/16 2059 03/21/16 0500 03/21/16 0609 03/21/16 0705  BP: (!) 154/92  (!) 172/101 (!) 154/79  Pulse: (!) 109  (!) 104 100  Resp:   (!) 21   Temp: 97.9 F (36.6 C)  98 F (36.7 C)   TempSrc: Oral  Oral   SpO2: 93%  94%   Weight:  (!) 273.5 kg (603 lb)      Intake/Output Summary (Last 24 hours) at 03/21/16 1300 Last data filed at 03/21/16 0918  Gross per 24 hour  Intake              980 ml  Output             3325 ml  Net            -2345 ml   Filed Weights   03/20/16 0115 03/21/16 0500  Weight: (!) 286.6 kg (631 lb 14.4 oz) (!) 273.5 kg (603 lb)    Examination:  General exam: Appears calm and comfortable  Respiratory system: diminished due to size Cardiovascular system: S1 & S2 heard, RRR. No JVD, murmurs, rubs, gallops or clicks. Right leg wrapped Gastrointestinal system: Abdomen is obese Central nervous system: Alert and oriented. No focal neurological deficits. Extremities: moved all 4 ext     Data Reviewed: I have personally reviewed following labs and imaging studies  CBC:  Recent Labs Lab 03/20/16 0118 03/20/16 0556  WBC 7.7 7.2  NEUTROABS 5.5  5.0  HGB 11.2* 10.8*  HCT 34.9* 34.0*  MCV 76.9* 76.7*  PLT 240 224   Basic Metabolic Panel:  Recent Labs Lab 03/20/16 0118 03/20/16 0556  NA 140 140  K 3.4* 3.1*  CL 103 103  CO2 27 26  GLUCOSE 104* 100*  BUN 20 19  CREATININE 1.13 0.98  CALCIUM 8.9 9.0  MG  --  1.9   GFR: Estimated Creatinine Clearance: 238.2 mL/min (by C-G formula based on SCr of 0.98 mg/dL). Liver Function Tests:  Recent Labs Lab 03/20/16 0556  AST 32  ALT 22  ALKPHOS 58  BILITOT 0.8  PROT 7.5  ALBUMIN 3.7   No results for input(s): LIPASE, AMYLASE in the last 168 hours. No results for input(s): AMMONIA in the last 168 hours. Coagulation Profile: No results for  input(s): INR, PROTIME in the last 168 hours. Cardiac Enzymes:  Recent Labs Lab 03/20/16 0556 03/20/16 1214 03/20/16 1723  TROPONINI 0.09* 0.08* 0.08*   BNP (last 3 results) No results for input(s): PROBNP in the last 8760 hours. HbA1C: No results for input(s): HGBA1C in the last 72 hours. CBG: No results for input(s): GLUCAP in the last 168 hours. Lipid Profile: No results for input(s): CHOL, HDL, LDLCALC, TRIG, CHOLHDL, LDLDIRECT in the last 72 hours. Thyroid Function Tests: No results for input(s): TSH, T4TOTAL, FREET4, T3FREE, THYROIDAB in the last 72 hours. Anemia Panel:  Recent Labs  03/20/16 0556  VITAMINB12 685  FOLATE 6.4  FERRITIN 55  TIBC 351  IRON 32*  RETICCTPCT 1.7   Urine analysis:    Component Value Date/Time   LABSPEC >=1.030 08/27/2014 1602   PHURINE 6.0 08/27/2014 1602   GLUCOSEU NEGATIVE 08/27/2014 1602   HGBUR TRACE (A) 08/27/2014 1602   BILIRUBINUR NEGATIVE 08/27/2014 1602   KETONESUR NEGATIVE 08/27/2014 1602   PROTEINUR 30 (A) 08/27/2014 1602   UROBILINOGEN 0.2 08/27/2014 1602   NITRITE NEGATIVE 08/27/2014 1602   LEUKOCYTESUR SMALL (A) 08/27/2014 1602     ) Recent Results (from the past 240 hour(s))  MRSA PCR Screening     Status: None   Collection Time: 03/20/16  6:40 PM  Result Value Ref Range Status   MRSA by PCR NEGATIVE NEGATIVE Final    Comment:        The GeneXpert MRSA Assay (FDA approved for NASAL specimens only), is one component of a comprehensive MRSA colonization surveillance program. It is not intended to diagnose MRSA infection nor to guide or monitor treatment for MRSA infections.       Anti-infectives    Start     Dose/Rate Route Frequency Ordered Stop   03/20/16 0600  doxycycline (VIBRAMYCIN) 100 mg in dextrose 5 % 250 mL IVPB     100 mg 125 mL/hr over 120 Minutes Intravenous Every 12 hours 03/20/16 0512         Radiology Studies: Dg Chest 2 View  Result Date: 03/20/2016 CLINICAL DATA:  Shortness  of breath and abdominal swelling for 3 days. Chronic lymphedema. High blood pressure. EXAM: CHEST  2 VIEW COMPARISON:  12/26/2015 FINDINGS: Shallow inspiration. Borderline heart size without vascular congestion or edema. No focal consolidation or airspace disease. No blunting of costophrenic angles. No pneumothorax. Calcified granuloma in the right lung. IMPRESSION: Cardiac enlargement.  No evidence of active pulmonary disease. Electronically Signed   By: Burman Nieves M.D.   On: 03/20/2016 01:39   Ct Angio Chest Pe W Or Wo Contrast  Result Date: 03/20/2016 CLINICAL DATA:  36 year old male with  super morbid obesity (631 pounds). Increasing shortness of breath. Increasing weight and abdominal girth recently. Hypertensive at presentation. Initial encounter. EXAM: CT ANGIOGRAPHY CHEST CT ABDOMEN AND PELVIS WITH CONTRAST TECHNIQUE: Multidetector CT imaging of the chest was performed using the standard protocol during bolus administration of intravenous contrast. Multiplanar CT image reconstructions and MIPs were obtained to evaluate the vascular anatomy. Multidetector CT imaging of the abdomen and pelvis was performed using the standard protocol during bolus administration of intravenous contrast. CONTRAST:  100 mL Isovue 370 COMPARISON:  CT Abdomen and Pelvis 11/05/2011, 05/09/2009 FINDINGS: CTA CHEST FINDINGS Cardiovascular: Suboptimal contrast bolus timing in the pulmonary arterial tree. Quantum mottle artifact. Mild respiratory motion artifact. No central pulmonary embolus. The lobar and distal pulmonary arteries are not well evaluated. Mild cardiomegaly is new since 2013. No pericardial effusion. Negative thoracic aorta. Mediastinum/Nodes: No lymphadenopathy. Lungs/Pleura: Major airways are patent except for atelectatic changes. There is a calcified granuloma in the right upper lobe. Lower lung volumes compared to 2013 with crowding of lung markings but no abnormal pulmonary opacity or pleural effusion.  Musculoskeletal: No acute osseous abnormality identified. Review of the MIP images confirms the above findings. CT ABDOMEN and PELVIS FINDINGS Hepatobiliary: The liver appears stable and within normal limits. Gallbladder detail is obscured, but the gallbladder is not abnormally dilated. Pancreas: The pancreatic tail appears within normal limits, the body and head of the pancreas are obscured by quantum mottle. Spleen: The spleen appears stable and within normal limits. Adrenals/Urinary Tract: No hydronephrosis. On delayed images there is bilateral renal excretion of IV contrast. The urinary bladder is distended but otherwise appears normal. Stomach/Bowel: Negative rectum. The splenic flexure and distal transverse colon appear within normal limits. Proximal small bowel loops and the hepatic flexure appear within normal limits on the delayed excretory images. Other small and large bowel loops are obscured by quantum mottle. Negative proximal stomach. Vascular/Lymphatic: Numerous chronic venous collaterals about both inguinal regions and the lower abdominal panniculus. Chronic right inguinal postoperative changes with numerous surgical clips, stable since 2013. Quantum mottle degrades detail of the abdominal aorta and iliac arteries, the abdominal aorta and proximal common iliac arteries appear within normal limits on the delayed excretory phase images. Chronically enlarged bilateral inguinal lymph nodes appear not significantly changed since 2013. There is evidence of right greater than left external iliac artery lymphadenopathy which has increased since 2013 with individual nodes measuring up to 27 mm short axis (previously 18 mm). The mesenteric and retroperitoneal spaces in the abdomen and pelvis are poorly visualized due to quantum mottle. It is difficult to exclude retroperitoneal lymphadenopathy in the upper abdomen. Reproductive: Negative. Other: No pelvic free fluid. Musculoskeletal: Lumbar and pelvic bone  detail degraded by body habitus/quantum mottle. No acute osseous abnormality identified. Review of the MIP images confirms the above findings. IMPRESSION: Limited diagnostic information, especially in the abdomen, related to large patient size. The following conclusions can be made: - No central pulmonary embolus, with lobar and distal pulmonary arteries not well evaluated. - Negative aorta. Mild cardiomegaly is new since 2013. No pericardial effusion. - Pulmonary atelectasis, but no other lung abnormality or or pleural effusion. - No renal obstruction. - Chronic inguinal lymphadenopathy appears stable since 2013 although right external iliac artery adenopathy has progressed and is now moderate to severe (individual nodes up to 27 mm short axis). This is nonspecific. Sequelae of chronic infection/inflammation seems more likely than a systemic lymphoproliferative disorder or Lymphoma considering lack of thoracic and no definite upper abdominal lymphadenopathy. -  Chronic venous collaterals about the pelvis, stable since 2013. Electronically Signed   By: Odessa Fleming M.D.   On: 03/20/2016 13:56   Ct Abdomen Pelvis W Contrast  Result Date: 03/20/2016 CLINICAL DATA:  36 year old male with super morbid obesity (631 pounds). Increasing shortness of breath. Increasing weight and abdominal girth recently. Hypertensive at presentation. Initial encounter. EXAM: CT ANGIOGRAPHY CHEST CT ABDOMEN AND PELVIS WITH CONTRAST TECHNIQUE: Multidetector CT imaging of the chest was performed using the standard protocol during bolus administration of intravenous contrast. Multiplanar CT image reconstructions and MIPs were obtained to evaluate the vascular anatomy. Multidetector CT imaging of the abdomen and pelvis was performed using the standard protocol during bolus administration of intravenous contrast. CONTRAST:  100 mL Isovue 370 COMPARISON:  CT Abdomen and Pelvis 11/05/2011, 05/09/2009 FINDINGS: CTA CHEST FINDINGS Cardiovascular:  Suboptimal contrast bolus timing in the pulmonary arterial tree. Quantum mottle artifact. Mild respiratory motion artifact. No central pulmonary embolus. The lobar and distal pulmonary arteries are not well evaluated. Mild cardiomegaly is new since 2013. No pericardial effusion. Negative thoracic aorta. Mediastinum/Nodes: No lymphadenopathy. Lungs/Pleura: Major airways are patent except for atelectatic changes. There is a calcified granuloma in the right upper lobe. Lower lung volumes compared to 2013 with crowding of lung markings but no abnormal pulmonary opacity or pleural effusion. Musculoskeletal: No acute osseous abnormality identified. Review of the MIP images confirms the above findings. CT ABDOMEN and PELVIS FINDINGS Hepatobiliary: The liver appears stable and within normal limits. Gallbladder detail is obscured, but the gallbladder is not abnormally dilated. Pancreas: The pancreatic tail appears within normal limits, the body and head of the pancreas are obscured by quantum mottle. Spleen: The spleen appears stable and within normal limits. Adrenals/Urinary Tract: No hydronephrosis. On delayed images there is bilateral renal excretion of IV contrast. The urinary bladder is distended but otherwise appears normal. Stomach/Bowel: Negative rectum. The splenic flexure and distal transverse colon appear within normal limits. Proximal small bowel loops and the hepatic flexure appear within normal limits on the delayed excretory images. Other small and large bowel loops are obscured by quantum mottle. Negative proximal stomach. Vascular/Lymphatic: Numerous chronic venous collaterals about both inguinal regions and the lower abdominal panniculus. Chronic right inguinal postoperative changes with numerous surgical clips, stable since 2013. Quantum mottle degrades detail of the abdominal aorta and iliac arteries, the abdominal aorta and proximal common iliac arteries appear within normal limits on the delayed  excretory phase images. Chronically enlarged bilateral inguinal lymph nodes appear not significantly changed since 2013. There is evidence of right greater than left external iliac artery lymphadenopathy which has increased since 2013 with individual nodes measuring up to 27 mm short axis (previously 18 mm). The mesenteric and retroperitoneal spaces in the abdomen and pelvis are poorly visualized due to quantum mottle. It is difficult to exclude retroperitoneal lymphadenopathy in the upper abdomen. Reproductive: Negative. Other: No pelvic free fluid. Musculoskeletal: Lumbar and pelvic bone detail degraded by body habitus/quantum mottle. No acute osseous abnormality identified. Review of the MIP images confirms the above findings. IMPRESSION: Limited diagnostic information, especially in the abdomen, related to large patient size. The following conclusions can be made: - No central pulmonary embolus, with lobar and distal pulmonary arteries not well evaluated. - Negative aorta. Mild cardiomegaly is new since 2013. No pericardial effusion. - Pulmonary atelectasis, but no other lung abnormality or or pleural effusion. - No renal obstruction. - Chronic inguinal lymphadenopathy appears stable since 2013 although right external iliac artery adenopathy has progressed and is  now moderate to severe (individual nodes up to 27 mm short axis). This is nonspecific. Sequelae of chronic infection/inflammation seems more likely than a systemic lymphoproliferative disorder or Lymphoma considering lack of thoracic and no definite upper abdominal lymphadenopathy. - Chronic venous collaterals about the pelvis, stable since 2013. Electronically Signed   By: Odessa Fleming M.D.   On: 03/20/2016 13:56        Scheduled Meds: . doxycycline (VIBRAMYCIN) IV  100 mg Intravenous Q12H  . enoxaparin (LOVENOX) injection  100 mg Subcutaneous Q24H  . furosemide  40 mg Intravenous BID  . hydrochlorothiazide  25 mg Oral Daily  . lisinopril  20 mg  Oral Daily  . potassium chloride  40 mEq Oral Daily   Continuous Infusions:   LOS: 0 days    Time spent: 25 min    Judaea Burgoon U Verdon Ferrante, DO [facility] Pager [phone]  If 7PM-7AM, please contact night-coverage [email or web address] Password [email or web address] 03/21/2016, 1:00 PM

## 2016-03-21 NOTE — Progress Notes (Addendum)
Pt. BMP results came back normal. No results concerning and MD was not paged with abnormal labs. Pt. Requesting A1C to be drawn. On call NP Schorr notified and requested orders to be put in for A1C level along with AM labs. Will continue to monitor pt. Closely

## 2016-03-21 NOTE — Progress Notes (Signed)
  Echocardiogram 2D Echocardiogram with Definity has been performed.  [name], [name] 03/21/2016, 11:54 AM

## 2016-03-22 DIAGNOSIS — I872 Venous insufficiency (chronic) (peripheral): Secondary | ICD-10-CM

## 2016-03-22 LAB — BASIC METABOLIC PANEL
Anion gap: 10 (ref 5–15)
BUN: 13 mg/dL (ref 6–20)
CO2: 31 mmol/L (ref 22–32)
CREATININE: 0.9 mg/dL (ref 0.61–1.24)
Calcium: 9 mg/dL (ref 8.9–10.3)
Chloride: 97 mmol/L — ABNORMAL LOW (ref 101–111)
GFR calc non Af Amer: >60 mL/min (ref >60)
Glucose, Bld: 111 mg/dL — ABNORMAL HIGH (ref 65–99)
Potassium: 3.3 mmol/L — ABNORMAL LOW (ref 3.5–5.1)
Sodium: 138 mmol/L (ref 135–145)

## 2016-03-22 LAB — CBC
HCT: 33.5 % — ABNORMAL LOW (ref 39.0–52.0)
Hemoglobin: 10.7 g/dL — ABNORMAL LOW (ref 13.0–17.0)
MCH: 24.2 pg — AB (ref 26.0–34.0)
MCHC: 31.9 g/dL (ref 30.0–36.0)
MCV: 75.8 fL — AB (ref 78.0–100.0)
PLATELETS: 239 10*3/uL (ref 150–400)
RBC: 4.42 MIL/uL (ref 4.22–5.81)
RDW: 17.9 % — ABNORMAL HIGH (ref 11.5–15.5)
WBC: 8.1 10*3/uL (ref 4.0–10.5)

## 2016-03-22 MED ORDER — POTASSIUM CHLORIDE CRYS ER 20 MEQ PO TBCR
40.0000 meq | EXTENDED_RELEASE_TABLET | ORAL | Status: AC
  Administered 2016-03-22: 40 meq via ORAL
  Filled 2016-03-22: qty 2

## 2016-03-22 MED ORDER — POTASSIUM CHLORIDE CRYS ER 20 MEQ PO TBCR
40.0000 meq | EXTENDED_RELEASE_TABLET | Freq: Two times a day (BID) | ORAL | Status: DC
  Administered 2016-03-22 – 2016-03-24 (×6): 40 meq via ORAL
  Filled 2016-03-22 (×6): qty 2

## 2016-03-22 NOTE — Progress Notes (Signed)
Patient Name: [name] Date of Encounter: 03/22/2016  Primary Cardiologist: Dr. Kristeen Miss Problem List     Principal Problem:   Acute respiratory failure with hypoxia [facility]) Active Problems:   Venous stasis ulcer (HCC)   Morbid obesity (HCC)   Chronic acquired lymphedema   SOB (shortness of breath)   Hypertensive urgency   Abdominal pain   OSA (obstructive sleep apnea)   Acute respiratory failure (HCC)    Subjective   Can really tell a difference. Can now bend his legs enough to use the bathroom. SOB improving. LEE improving. Still volume overloaded.  Inpatient Medications    . doxycycline (VIBRAMYCIN) IV  100 mg Intravenous Q12H  . enoxaparin (LOVENOX) injection  100 mg Subcutaneous Q24H  . furosemide  40 mg Intravenous Q8H  . lisinopril  20 mg Oral Daily  . potassium chloride  40 mEq Oral Daily  . potassium chloride  40 mEq Oral STAT    Vital Signs    Vitals:   03/21/16 0705 03/21/16 1408 03/21/16 2319 03/22/16 0643  BP: (!) 154/79 (!) 155/95 (!) 133/99 (!) 153/102  Pulse: 100 (!) 106 (!) 109 (!) 103  Resp:  18 20 20   Temp:  97.7 F (36.5 C) 99.5 F (37.5 C) 99.9 F (37.7 C)  TempSrc:  Oral Oral Oral  SpO2:  100% 95% 97%  Weight:    (!) 458 lb (207.7 kg)    Intake/Output Summary (Last 24 hours) at 03/22/16 0924 Last data filed at 03/22/16 0434  Gross per 24 hour  Intake              720 ml  Output             3650 ml  Net            -2930 ml   Filed Weights   03/20/16 0115 03/21/16 0500 03/22/16 0643  Weight: (!) 631 lb 14.4 oz (286.6 kg) (!) 603 lb (273.5 kg) (!) 458 lb (207.7 kg)    Physical Exam    General: Well developed, well nourished morbidly obese in no acute distress. HEENT: Normocephalic, atraumatic, sclera non-icteric, no xanthomas, nares are without discharge. Neck: JVD difficult to assess given habitus, supple. Lungs: Diminished throughout without wheezes, rales, or rhonchi. Breathing is unlabored. Cardiac: RRR S1 S2  without murmurs, rubs, or gallops.  Abdomen: Soft, non-tender, non-distended with normoactive bowel sounds. No rebound/guarding. Extremities: No clubbing or cyanosis. Diffuse edema, difficult to differentiate edema versus body habitus Skin: Warm and dry Neuro: Alert and oriented X 3. Sensation in tact. Follows commands. Psych:  Responds to questions appropriately with a normal affect.  Labs    CBC  Recent Labs  03/20/16 0118 03/20/16 0556 03/22/16 0510  WBC 7.7 7.2 8.1  NEUTROABS 5.5 5.0  --   HGB 11.2* 10.8* 10.7*  HCT 34.9* 34.0* 33.5*  MCV 76.9* 76.7* 75.8*  PLT 240 224 239   Basic Metabolic Panel  Recent Labs  03/20/16 0556 03/21/16 1853 03/22/16 0510  NA 140 138 138  K 3.1* 3.8 3.3*  CL 103 99* 97*  CO2 26 31 31   GLUCOSE 100* 106* 111*  BUN 19 12 13   CREATININE 0.98 0.92 0.90  CALCIUM 9.0 9.2 9.0  MG 1.9 1.9  --    Liver Function Tests  Recent Labs  03/20/16 0556  AST 32  ALT 22  ALKPHOS 58  BILITOT 0.8  PROT 7.5  ALBUMIN 3.7   Cardiac Enzymes  Recent  Labs  03/20/16 0556 03/20/16 1214 03/20/16 1723  TROPONINI 0.09* 0.08* 0.08*    Telemetry    NSR/sinus tach  Radiology    Dg Chest 2 View  Result Date: 03/20/2016 CLINICAL DATA:  Shortness of breath and abdominal swelling for 3 days. Chronic lymphedema. High blood pressure. EXAM: CHEST  2 VIEW COMPARISON:  12/26/2015 FINDINGS: Shallow inspiration. Borderline heart size without vascular congestion or edema. No focal consolidation or airspace disease. No blunting of costophrenic angles. No pneumothorax. Calcified granuloma in the right lung. IMPRESSION: Cardiac enlargement.  No evidence of active pulmonary disease. Electronically Signed   By: Burman Nieves M.D.   On: 03/20/2016 01:39   Ct Angio Chest Pe W Or Wo Contrast  Result Date: 03/20/2016 CLINICAL DATA:  36 year old male with super morbid obesity (631 pounds). Increasing shortness of breath. Increasing weight and abdominal girth  recently. Hypertensive at presentation. Initial encounter. EXAM: CT ANGIOGRAPHY CHEST CT ABDOMEN AND PELVIS WITH CONTRAST TECHNIQUE: Multidetector CT imaging of the chest was performed using the standard protocol during bolus administration of intravenous contrast. Multiplanar CT image reconstructions and MIPs were obtained to evaluate the vascular anatomy. Multidetector CT imaging of the abdomen and pelvis was performed using the standard protocol during bolus administration of intravenous contrast. CONTRAST:  100 mL Isovue 370 COMPARISON:  CT Abdomen and Pelvis 11/05/2011, 05/09/2009 FINDINGS: CTA CHEST FINDINGS Cardiovascular: Suboptimal contrast bolus timing in the pulmonary arterial tree. Quantum mottle artifact. Mild respiratory motion artifact. No central pulmonary embolus. The lobar and distal pulmonary arteries are not well evaluated. Mild cardiomegaly is new since 2013. No pericardial effusion. Negative thoracic aorta. Mediastinum/Nodes: No lymphadenopathy. Lungs/Pleura: Major airways are patent except for atelectatic changes. There is a calcified granuloma in the right upper lobe. Lower lung volumes compared to 2013 with crowding of lung markings but no abnormal pulmonary opacity or pleural effusion. Musculoskeletal: No acute osseous abnormality identified. Review of the MIP images confirms the above findings. CT ABDOMEN and PELVIS FINDINGS Hepatobiliary: The liver appears stable and within normal limits. Gallbladder detail is obscured, but the gallbladder is not abnormally dilated. Pancreas: The pancreatic tail appears within normal limits, the body and head of the pancreas are obscured by quantum mottle. Spleen: The spleen appears stable and within normal limits. Adrenals/Urinary Tract: No hydronephrosis. On delayed images there is bilateral renal excretion of IV contrast. The urinary bladder is distended but otherwise appears normal. Stomach/Bowel: Negative rectum. The splenic flexure and distal  transverse colon appear within normal limits. Proximal small bowel loops and the hepatic flexure appear within normal limits on the delayed excretory images. Other small and large bowel loops are obscured by quantum mottle. Negative proximal stomach. Vascular/Lymphatic: Numerous chronic venous collaterals about both inguinal regions and the lower abdominal panniculus. Chronic right inguinal postoperative changes with numerous surgical clips, stable since 2013. Quantum mottle degrades detail of the abdominal aorta and iliac arteries, the abdominal aorta and proximal common iliac arteries appear within normal limits on the delayed excretory phase images. Chronically enlarged bilateral inguinal lymph nodes appear not significantly changed since 2013. There is evidence of right greater than left external iliac artery lymphadenopathy which has increased since 2013 with individual nodes measuring up to 27 mm short axis (previously 18 mm). The mesenteric and retroperitoneal spaces in the abdomen and pelvis are poorly visualized due to quantum mottle. It is difficult to exclude retroperitoneal lymphadenopathy in the upper abdomen. Reproductive: Negative. Other: No pelvic free fluid. Musculoskeletal: Lumbar and pelvic bone detail degraded by body habitus/quantum  mottle. No acute osseous abnormality identified. Review of the MIP images confirms the above findings. IMPRESSION: Limited diagnostic information, especially in the abdomen, related to large patient size. The following conclusions can be made: - No central pulmonary embolus, with lobar and distal pulmonary arteries not well evaluated. - Negative aorta. Mild cardiomegaly is new since 2013. No pericardial effusion. - Pulmonary atelectasis, but no other lung abnormality or or pleural effusion. - No renal obstruction. - Chronic inguinal lymphadenopathy appears stable since 2013 although right external iliac artery adenopathy has progressed and is now moderate to severe  (individual nodes up to 27 mm short axis). This is nonspecific. Sequelae of chronic infection/inflammation seems more likely than a systemic lymphoproliferative disorder or Lymphoma considering lack of thoracic and no definite upper abdominal lymphadenopathy. - Chronic venous collaterals about the pelvis, stable since 2013. Electronically Signed   By: Odessa FlemingH  Hall M.D.   On: 03/20/2016 13:56   Ct Abdomen Pelvis W Contrast  Result Date: 03/20/2016 CLINICAL DATA:  36 year old male with super morbid obesity (631 pounds). Increasing shortness of breath. Increasing weight and abdominal girth recently. Hypertensive at presentation. Initial encounter. EXAM: CT ANGIOGRAPHY CHEST CT ABDOMEN AND PELVIS WITH CONTRAST TECHNIQUE: Multidetector CT imaging of the chest was performed using the standard protocol during bolus administration of intravenous contrast. Multiplanar CT image reconstructions and MIPs were obtained to evaluate the vascular anatomy. Multidetector CT imaging of the abdomen and pelvis was performed using the standard protocol during bolus administration of intravenous contrast. CONTRAST:  100 mL Isovue 370 COMPARISON:  CT Abdomen and Pelvis 11/05/2011, 05/09/2009 FINDINGS: CTA CHEST FINDINGS Cardiovascular: Suboptimal contrast bolus timing in the pulmonary arterial tree. Quantum mottle artifact. Mild respiratory motion artifact. No central pulmonary embolus. The lobar and distal pulmonary arteries are not well evaluated. Mild cardiomegaly is new since 2013. No pericardial effusion. Negative thoracic aorta. Mediastinum/Nodes: No lymphadenopathy. Lungs/Pleura: Major airways are patent except for atelectatic changes. There is a calcified granuloma in the right upper lobe. Lower lung volumes compared to 2013 with crowding of lung markings but no abnormal pulmonary opacity or pleural effusion. Musculoskeletal: No acute osseous abnormality identified. Review of the MIP images confirms the above findings. CT ABDOMEN  and PELVIS FINDINGS Hepatobiliary: The liver appears stable and within normal limits. Gallbladder detail is obscured, but the gallbladder is not abnormally dilated. Pancreas: The pancreatic tail appears within normal limits, the body and head of the pancreas are obscured by quantum mottle. Spleen: The spleen appears stable and within normal limits. Adrenals/Urinary Tract: No hydronephrosis. On delayed images there is bilateral renal excretion of IV contrast. The urinary bladder is distended but otherwise appears normal. Stomach/Bowel: Negative rectum. The splenic flexure and distal transverse colon appear within normal limits. Proximal small bowel loops and the hepatic flexure appear within normal limits on the delayed excretory images. Other small and large bowel loops are obscured by quantum mottle. Negative proximal stomach. Vascular/Lymphatic: Numerous chronic venous collaterals about both inguinal regions and the lower abdominal panniculus. Chronic right inguinal postoperative changes with numerous surgical clips, stable since 2013. Quantum mottle degrades detail of the abdominal aorta and iliac arteries, the abdominal aorta and proximal common iliac arteries appear within normal limits on the delayed excretory phase images. Chronically enlarged bilateral inguinal lymph nodes appear not significantly changed since 2013. There is evidence of right greater than left external iliac artery lymphadenopathy which has increased since 2013 with individual nodes measuring up to 27 mm short axis (previously 18 mm). The mesenteric and retroperitoneal spaces  in the abdomen and pelvis are poorly visualized due to quantum mottle. It is difficult to exclude retroperitoneal lymphadenopathy in the upper abdomen. Reproductive: Negative. Other: No pelvic free fluid. Musculoskeletal: Lumbar and pelvic bone detail degraded by body habitus/quantum mottle. No acute osseous abnormality identified. Review of the MIP images confirms the  above findings. IMPRESSION: Limited diagnostic information, especially in the abdomen, related to large patient size. The following conclusions can be made: - No central pulmonary embolus, with lobar and distal pulmonary arteries not well evaluated. - Negative aorta. Mild cardiomegaly is new since 2013. No pericardial effusion. - Pulmonary atelectasis, but no other lung abnormality or or pleural effusion. - No renal obstruction. - Chronic inguinal lymphadenopathy appears stable since 2013 although right external iliac artery adenopathy has progressed and is now moderate to severe (individual nodes up to 27 mm short axis). This is nonspecific. Sequelae of chronic infection/inflammation seems more likely than a systemic lymphoproliferative disorder or Lymphoma considering lack of thoracic and no definite upper abdominal lymphadenopathy. - Chronic venous collaterals about the pelvis, stable since 2013. Electronically Signed   By: Odessa Fleming M.D.   On: 03/20/2016 13:56    Patient Profile     3M with chronic venous insufficiency s/p venous bypass, morbid obesity, hypertension, lymphedema admitted with progressive swelling, SOB, and progressive weight gain (previously 520's in 12/2015).  Assessment & Plan   1. Volume overload - unclear if due to congestive heart failure or compressive lymphedema. 2D echo uninterpretable; patient would be too large for accurate assessment by other noninvasive testing (i.e. cMRI, coronary CTA). Would continue IV Lasix TID as he continues to have good clinical response. Have asked nursing to re-weigh patient (?100lb difference in value since yesterday). More than likely will need to follow BUN/Cr and CO to guide endpoint of diuresis as his volume is almost impossible to assess given his habitus.   2. Essential HTN/hypertensive urgency - follow with diuresis. Consider initiation of beta blocker today. There is also room to titrate ACEI but want to follow renal function with  aggressive diuresis.  3. Hypokalemia - wrote rx for at 8am. Increase standing KCl to BID thereafter and follow. Mg OK yesterday.  3. Morbid obesity - life-threatening at this point. Strongly encourage continued f/u with bariatric surgery. We discussed risks of [place]-term affects.  4. Anemia - per IM.  5. Abdominal wall infection - per IM.   Signed, Laurann Montana, PA-C  03/22/2016, 9:24 AM   I have seen, examined and evaluated the patient this AM along with Ronie Spies, PA-C.  After reviewing all the available data and chart, we discussed the patients laboratory, study & physical findings as well as symptoms in detail. I agree with her findings, examination as well as impression recommendations as per our discussion.    He has had very brisk diuresis withsed dose of Lasix. I would continue as were doing. He has lost over 2 L according to current weights. He was just reweighed and it was more accurate. He needs to be weighed on a standing scale. I would continue IV diuresis until he starts showing signs on his BMP with increased BUN and creatinine to note that he is fully diuresed. He has significant lymphedema and venous stasis. I am not convinced that this is heart failure as he is able to lie relatively flat. I think it may just be simply has congestive heart failure from volume overload. Unfortunately we don't really have a good way of evaluating  his cardiac function because of his size.  With aggressive diuresis he will need his potassium repleted. Once we finally get him euvolemic/dry, he will need to be on a strict fluid regimen, low-salt regimen and be on sliding scale Lasix.  We will continue to follow along peripherally, and will likely see on Monday.    Bryan Lemma, M.D., M.S. Interventional Cardiologist   Pager # [phone] Phone # [phone] [address] [place], [place] [address]

## 2016-03-22 NOTE — Progress Notes (Signed)
PROGRESS NOTE    [name]  ZOX:[id number] DOB: March 29, 1980 DOA: 03/20/2016 PCP: Lupe Carney, MD   Outpatient Specialists:     Brief Narrative:  [name] is a 36 y.o. male with history of super morbid obesity, hypertension, sleep apnea presents to the ER because of increasing shortness of breath over the last few days. Patient states he may have gained at least 70 pounds in the last 1 month. Shortness of breath is particularly increased on exertion. Denies any chest pain, productive cough fever or chills. Patient also noticed increasing swelling in his lower extremity. His abdominal girth also has increased. Complains of increasing pain around the left side of his abdomen wall. Denies any fall or trauma. On exam patient is not in distress. Chest x-ray shows cardiomegaly. Abdomen appears slightly erythematous on the left side. Patient has chronic wound on the right lower extremity. At presentation patient's blood pressure is also markedly elevated.    Assessment & Plan:   Principal Problem:   Acute respiratory failure with hypoxia (HCC) Active Problems:   Venous stasis ulcer (HCC)   Morbid obesity (HCC)   Chronic acquired lymphedema   SOB (shortness of breath)   Hypertensive urgency   Abdominal pain   OSA (obstructive sleep apnea)   Acute respiratory failure (HCC)  Acute respiratory failure with hypoxia  -cause not clear  -suspect patient has CHF/volume overload - 2-D echo-- unable to read due to size-- cardiomegaly on scan-- appreciate cardiology helping with diuresis -CTA negative for PE -improving with IV lasix-- TID -KCL daily  Abdominal discomfort with erythema -  -doxycycline- would continue for 5 days -much improved  Hypertensive urgency -  -continue lisinopril  -IV lasix for now  Chronic wound of the right lower extremity -follows at [facility] consult as [name] boot due to be changed  Morbid obesity. -patient is making lifestyle  changes  Hypokalemia -replace -d/c HCTZ  DVT prophylaxis:  Lovenox   Code Status: Full Code   Family Communication: Mom at bedside  Disposition Plan:     Consultants:      Subjective: Laying more flat today  Objective: Vitals:   03/21/16 1408 03/21/16 2319 03/22/16 0643 03/22/16 0937  BP: (!) 155/95 (!) 133/99 (!) 153/102 126/67  Pulse: (!) 106 (!) 109 (!) 103   Resp: 18 20 20    Temp: 97.7 F (36.5 C) 99.5 F (37.5 C) 99.9 F (37.7 C)   TempSrc: Oral Oral Oral   SpO2: 100% 95% 97%   Weight:   (!) 207.7 kg (458 lb)     Intake/Output Summary (Last 24 hours) at 03/22/16 1038 Last data filed at 03/22/16 0958  Gross per 24 hour  Intake             1080 ml  Output             4125 ml  Net            -3045 ml   Filed Weights   03/20/16 0115 03/21/16 0500 03/22/16 0643  Weight: (!) 286.6 kg (631 lb 14.4 oz) (!) 273.5 kg (603 lb) (!) 207.7 kg (458 lb)    Examination:  General exam: Appears calm and comfortable  Respiratory system: diminished due to size Cardiovascular system: S1 & S2 heard, RRR. No JVD, murmurs, rubs, gallops or clicks. Right leg wrapped- + edema Gastrointestinal system: Abdomen is obese Central nervous system: Alert and oriented. No focal neurological deficits. Extremities: moved all 4 ext  Data Reviewed: I have personally reviewed following labs and imaging studies  CBC:  Recent Labs Lab 03/20/16 0118 03/20/16 0556 03/22/16 0510  WBC 7.7 7.2 8.1  NEUTROABS 5.5 5.0  --   HGB 11.2* 10.8* 10.7*  HCT 34.9* 34.0* 33.5*  MCV 76.9* 76.7* 75.8*  PLT 240 224 239   Basic Metabolic Panel:  Recent Labs Lab 03/20/16 0118 03/20/16 0556 03/21/16 1853 03/22/16 0510  NA 140 140 138 138  K 3.4* 3.1* 3.8 3.3*  CL 103 103 99* 97*  CO2 27 26 31 31   GLUCOSE 104* 100* 106* 111*  BUN 20 19 12 13   CREATININE 1.13 0.98 0.92 0.90  CALCIUM 8.9 9.0 9.2 9.0  MG  --  1.9 1.9  --    GFR: Estimated Creatinine Clearance: 216.8 mL/min  (by C-G formula based on SCr of 0.9 mg/dL). Liver Function Tests:  Recent Labs Lab 03/20/16 0556  AST 32  ALT 22  ALKPHOS 58  BILITOT 0.8  PROT 7.5  ALBUMIN 3.7   No results for input(s): LIPASE, AMYLASE in the last 168 hours. No results for input(s): AMMONIA in the last 168 hours. Coagulation Profile: No results for input(s): INR, PROTIME in the last 168 hours. Cardiac Enzymes:  Recent Labs Lab 03/20/16 0556 03/20/16 1214 03/20/16 1723  TROPONINI 0.09* 0.08* 0.08*   BNP (last 3 results) No results for input(s): PROBNP in the last 8760 hours. HbA1C: No results for input(s): HGBA1C in the last 72 hours. CBG: No results for input(s): GLUCAP in the last 168 hours. Lipid Profile: No results for input(s): CHOL, HDL, LDLCALC, TRIG, CHOLHDL, LDLDIRECT in the last 72 hours. Thyroid Function Tests: No results for input(s): TSH, T4TOTAL, FREET4, T3FREE, THYROIDAB in the last 72 hours. Anemia Panel:  Recent Labs  03/20/16 0556  VITAMINB12 685  FOLATE 6.4  FERRITIN 55  TIBC 351  IRON 32*  RETICCTPCT 1.7   Urine analysis:    Component Value Date/Time   LABSPEC >=1.030 08/27/2014 1602   PHURINE 6.0 08/27/2014 1602   GLUCOSEU NEGATIVE 08/27/2014 1602   HGBUR TRACE (A) 08/27/2014 1602   BILIRUBINUR NEGATIVE 08/27/2014 1602   KETONESUR NEGATIVE 08/27/2014 1602   PROTEINUR 30 (A) 08/27/2014 1602   UROBILINOGEN 0.2 08/27/2014 1602   NITRITE NEGATIVE 08/27/2014 1602   LEUKOCYTESUR SMALL (A) 08/27/2014 1602     ) Recent Results (from the past 240 hour(s))  MRSA PCR Screening     Status: None   Collection Time: 03/20/16  6:40 PM  Result Value Ref Range Status   MRSA by PCR NEGATIVE NEGATIVE Final    Comment:        The GeneXpert MRSA Assay (FDA approved for NASAL specimens only), is one component of a comprehensive MRSA colonization surveillance program. It is not intended to diagnose MRSA infection nor to guide or monitor treatment for MRSA infections.        Anti-infectives    Start     Dose/Rate Route Frequency Ordered Stop   03/20/16 0600  doxycycline (VIBRAMYCIN) 100 mg in dextrose 5 % 250 mL IVPB     100 mg 125 mL/hr over 120 Minutes Intravenous Every 12 hours 03/20/16 0512         Radiology Studies: Ct Angio Chest Pe W Or Wo Contrast  Result Date: 03/20/2016 CLINICAL DATA:  36 year old male with super morbid obesity (631 pounds). Increasing shortness of breath. Increasing weight and abdominal girth recently. Hypertensive at presentation. Initial encounter. EXAM: CT ANGIOGRAPHY CHEST CT ABDOMEN AND  PELVIS WITH CONTRAST TECHNIQUE: Multidetector CT imaging of the chest was performed using the standard protocol during bolus administration of intravenous contrast. Multiplanar CT image reconstructions and MIPs were obtained to evaluate the vascular anatomy. Multidetector CT imaging of the abdomen and pelvis was performed using the standard protocol during bolus administration of intravenous contrast. CONTRAST:  100 mL Isovue 370 COMPARISON:  CT Abdomen and Pelvis 11/05/2011, 05/09/2009 FINDINGS: CTA CHEST FINDINGS Cardiovascular: Suboptimal contrast bolus timing in the pulmonary arterial tree. Quantum mottle artifact. Mild respiratory motion artifact. No central pulmonary embolus. The lobar and distal pulmonary arteries are not well evaluated. Mild cardiomegaly is new since 2013. No pericardial effusion. Negative thoracic aorta. Mediastinum/Nodes: No lymphadenopathy. Lungs/Pleura: Major airways are patent except for atelectatic changes. There is a calcified granuloma in the right upper lobe. Lower lung volumes compared to 2013 with crowding of lung markings but no abnormal pulmonary opacity or pleural effusion. Musculoskeletal: No acute osseous abnormality identified. Review of the MIP images confirms the above findings. CT ABDOMEN and PELVIS FINDINGS Hepatobiliary: The liver appears stable and within normal limits. Gallbladder detail is obscured, but  the gallbladder is not abnormally dilated. Pancreas: The pancreatic tail appears within normal limits, the body and head of the pancreas are obscured by quantum mottle. Spleen: The spleen appears stable and within normal limits. Adrenals/Urinary Tract: No hydronephrosis. On delayed images there is bilateral renal excretion of IV contrast. The urinary bladder is distended but otherwise appears normal. Stomach/Bowel: Negative rectum. The splenic flexure and distal transverse colon appear within normal limits. Proximal small bowel loops and the hepatic flexure appear within normal limits on the delayed excretory images. Other small and large bowel loops are obscured by quantum mottle. Negative proximal stomach. Vascular/Lymphatic: Numerous chronic venous collaterals about both inguinal regions and the lower abdominal panniculus. Chronic right inguinal postoperative changes with numerous surgical clips, stable since 2013. Quantum mottle degrades detail of the abdominal aorta and iliac arteries, the abdominal aorta and proximal common iliac arteries appear within normal limits on the delayed excretory phase images. Chronically enlarged bilateral inguinal lymph nodes appear not significantly changed since 2013. There is evidence of right greater than left external iliac artery lymphadenopathy which has increased since 2013 with individual nodes measuring up to 27 mm short axis (previously 18 mm). The mesenteric and retroperitoneal spaces in the abdomen and pelvis are poorly visualized due to quantum mottle. It is difficult to exclude retroperitoneal lymphadenopathy in the upper abdomen. Reproductive: Negative. Other: No pelvic free fluid. Musculoskeletal: Lumbar and pelvic bone detail degraded by body habitus/quantum mottle. No acute osseous abnormality identified. Review of the MIP images confirms the above findings. IMPRESSION: Limited diagnostic information, especially in the abdomen, related to large patient size. The  following conclusions can be made: - No central pulmonary embolus, with lobar and distal pulmonary arteries not well evaluated. - Negative aorta. Mild cardiomegaly is new since 2013. No pericardial effusion. - Pulmonary atelectasis, but no other lung abnormality or or pleural effusion. - No renal obstruction. - Chronic inguinal lymphadenopathy appears stable since 2013 although right external iliac artery adenopathy has progressed and is now moderate to severe (individual nodes up to 27 mm short axis). This is nonspecific. Sequelae of chronic infection/inflammation seems more likely than a systemic lymphoproliferative disorder or Lymphoma considering lack of thoracic and no definite upper abdominal lymphadenopathy. - Chronic venous collaterals about the pelvis, stable since 2013. Electronically Signed   By: Odessa FlemingH  Hall M.D.   On: 03/20/2016 13:56   Ct Abdomen  Pelvis W Contrast  Result Date: 03/20/2016 CLINICAL DATA:  36 year old male with super morbid obesity (631 pounds). Increasing shortness of breath. Increasing weight and abdominal girth recently. Hypertensive at presentation. Initial encounter. EXAM: CT ANGIOGRAPHY CHEST CT ABDOMEN AND PELVIS WITH CONTRAST TECHNIQUE: Multidetector CT imaging of the chest was performed using the standard protocol during bolus administration of intravenous contrast. Multiplanar CT image reconstructions and MIPs were obtained to evaluate the vascular anatomy. Multidetector CT imaging of the abdomen and pelvis was performed using the standard protocol during bolus administration of intravenous contrast. CONTRAST:  100 mL Isovue 370 COMPARISON:  CT Abdomen and Pelvis 11/05/2011, 05/09/2009 FINDINGS: CTA CHEST FINDINGS Cardiovascular: Suboptimal contrast bolus timing in the pulmonary arterial tree. Quantum mottle artifact. Mild respiratory motion artifact. No central pulmonary embolus. The lobar and distal pulmonary arteries are not well evaluated. Mild cardiomegaly is new since  2013. No pericardial effusion. Negative thoracic aorta. Mediastinum/Nodes: No lymphadenopathy. Lungs/Pleura: Major airways are patent except for atelectatic changes. There is a calcified granuloma in the right upper lobe. Lower lung volumes compared to 2013 with crowding of lung markings but no abnormal pulmonary opacity or pleural effusion. Musculoskeletal: No acute osseous abnormality identified. Review of the MIP images confirms the above findings. CT ABDOMEN and PELVIS FINDINGS Hepatobiliary: The liver appears stable and within normal limits. Gallbladder detail is obscured, but the gallbladder is not abnormally dilated. Pancreas: The pancreatic tail appears within normal limits, the body and head of the pancreas are obscured by quantum mottle. Spleen: The spleen appears stable and within normal limits. Adrenals/Urinary Tract: No hydronephrosis. On delayed images there is bilateral renal excretion of IV contrast. The urinary bladder is distended but otherwise appears normal. Stomach/Bowel: Negative rectum. The splenic flexure and distal transverse colon appear within normal limits. Proximal small bowel loops and the hepatic flexure appear within normal limits on the delayed excretory images. Other small and large bowel loops are obscured by quantum mottle. Negative proximal stomach. Vascular/Lymphatic: Numerous chronic venous collaterals about both inguinal regions and the lower abdominal panniculus. Chronic right inguinal postoperative changes with numerous surgical clips, stable since 2013. Quantum mottle degrades detail of the abdominal aorta and iliac arteries, the abdominal aorta and proximal common iliac arteries appear within normal limits on the delayed excretory phase images. Chronically enlarged bilateral inguinal lymph nodes appear not significantly changed since 2013. There is evidence of right greater than left external iliac artery lymphadenopathy which has increased since 2013 with individual nodes  measuring up to 27 mm short axis (previously 18 mm). The mesenteric and retroperitoneal spaces in the abdomen and pelvis are poorly visualized due to quantum mottle. It is difficult to exclude retroperitoneal lymphadenopathy in the upper abdomen. Reproductive: Negative. Other: No pelvic free fluid. Musculoskeletal: Lumbar and pelvic bone detail degraded by body habitus/quantum mottle. No acute osseous abnormality identified. Review of the MIP images confirms the above findings. IMPRESSION: Limited diagnostic information, especially in the abdomen, related to large patient size. The following conclusions can be made: - No central pulmonary embolus, with lobar and distal pulmonary arteries not well evaluated. - Negative aorta. Mild cardiomegaly is new since 2013. No pericardial effusion. - Pulmonary atelectasis, but no other lung abnormality or or pleural effusion. - No renal obstruction. - Chronic inguinal lymphadenopathy appears stable since 2013 although right external iliac artery adenopathy has progressed and is now moderate to severe (individual nodes up to 27 mm short axis). This is nonspecific. Sequelae of chronic infection/inflammation seems more likely than a systemic lymphoproliferative disorder  or Lymphoma considering lack of thoracic and no definite upper abdominal lymphadenopathy. - Chronic venous collaterals about the pelvis, stable since 2013. Electronically Signed   By: Odessa Fleming M.D.   On: 03/20/2016 13:56        Scheduled Meds: . doxycycline (VIBRAMYCIN) IV  100 mg Intravenous Q12H  . enoxaparin (LOVENOX) injection  100 mg Subcutaneous Q24H  . furosemide  40 mg Intravenous Q8H  . lisinopril  20 mg Oral Daily  . potassium chloride  40 mEq Oral STAT  . potassium chloride  40 mEq Oral BID   Continuous Infusions:   LOS: 1 day    Time spent: 25 min    Pearlie Lafosse U Breklyn Fabrizio, DO [facility] Pager [phone]  If 7PM-7AM, please contact night-coverage [email or web address] Password  [facility] 03/22/2016, 10:38 AM

## 2016-03-22 NOTE — Consult Note (Signed)
WOC Nurse wound consult note Reason for Consult: Venous insufficiency/lymphedema with ulceration of greater than 1 year duration.  [facility] Wound type:venous insufficiency/lymphedema with ulceration Pressure Injury POA: No Measurement:9cm x 20cm area of involvement with 0.8cm depth at the deepest point Wound bed:red, moist, no necrotic tissue. Island of intact skin stained with blue/green drainage consistent with pseudomonas overgrowth. Drainage (amount, consistency, odor) See above.  RN reports large amount of blue/green exudate on old dressing. Periwound:intact, dry. Dressing procedure/placement/frequency: Patient and aunt do not see personnel at the outpatient wound care center regularly or routinely, visiting only when wound "smells bad".  We will establish a twice weekly (Tuesday/Friday) change schedule using a silver hydrofiber dressing as a wound contact layer and suggest home care be considered as a more viable option for routine dressing changes.  If you agree, please order/arrange. WOC nursing team will not follow routinely, but will remain available to this patient, the nursing and medical teams.  Please re-consult if needed. Thanks, Ladona MowLaurie Andray Assefa, MSN, RN, GNP, Hans EdenCWOCN, CWON-AP, FAAN  Pager# [phone]

## 2016-03-22 NOTE — Progress Notes (Signed)
Nutrition Education Note  RD consulted for nutrition education regarding CHF, morbid obesity, and large quantities of high kcal beverages intake.  RD provided "Low Sodium Nutrition Therapy," "Sodium Content of Foods," and "Sodium-Free Flavoring Tips" handouts from the Academy of Nutrition and Dietetics. Reviewed patient's dietary recall. Provided examples on ways to decrease sodium intake in diet. Discouraged intake of processed foods and use of salt shaker. Encouraged fresh fruits and vegetables as well as whole grain sources of carbohydrates to maximize fiber intake.   RD discussed why it is important for patient to adhere to diet recommendations, and emphasized the role of fluids, foods to avoid, and importance of weighing self daily. Teach back method used.  Pt was in and out of sleep throughout discussion. He states that he sees an RD on an outpatient basis and began working with her ~3-4 weeks PTA. So far they have discussed portion control and meal preparation to avoid overeating. Low sodium education has not been provided. Pt feels outpatient appointments have been beneficial and he feels that suggestions have been easy to follow so far. Pt usually drinks 6-8 20oz bottles of water/day, 1 Powerade, 1 Powerade light, and sometimes a bottle of Brisk iced tea. He reports that he stopped drinking soda in October or November as he was gaining a lot of weight. Explained this to pt in light of fluid and kcal. He states that he typically only drinks one alcoholic beverage/day.   Reviewed handouts in detail with pt. He asks about healthy snack options between meals and states that he is allergic to nuts. Suggestions provided. Reminded pt to review handouts on his own later today and to alert RN should he have further questions or concerns.   Expect fair compliance.  Body mass index is 73.21 kg/m. Pt meets criteria for morbid obesity based on current BMI.  Current diet order is Heart Healthy, patient  is consuming approximately 100% of meals at this time. Labs and medications reviewed. No further nutrition interventions warranted at this time. RD contact information provided. If additional nutrition issues arise, please re-consult RD.     Trenton GammonJessica Aishi Courts, MS, RD, LDN, [facility] Inpatient Clinical Dietitian Pager # [phone] After hours/weekend pager # [phone]

## 2016-03-23 LAB — TSH: TSH: 5.569 u[IU]/mL — AB (ref 0.350–4.500)

## 2016-03-23 LAB — BASIC METABOLIC PANEL
Anion gap: 8 (ref 5–15)
BUN: 13 mg/dL (ref 6–20)
CHLORIDE: 98 mmol/L — AB (ref 101–111)
CO2: 30 mmol/L (ref 22–32)
Calcium: 8.6 mg/dL — ABNORMAL LOW (ref 8.9–10.3)
Creatinine, Ser: 0.83 mg/dL (ref 0.61–1.24)
GFR calc Af Amer: >60 mL/min (ref >60)
GFR calc non Af Amer: >60 mL/min (ref >60)
GLUCOSE: 105 mg/dL — AB (ref 65–99)
Potassium: 3.8 mmol/L (ref 3.5–5.1)
Sodium: 136 mmol/L (ref 135–145)

## 2016-03-23 MED ORDER — HYDROCODONE-ACETAMINOPHEN 5-325 MG PO TABS
1.0000 | ORAL_TABLET | Freq: Four times a day (QID) | ORAL | Status: DC | PRN
  Administered 2016-03-23 – 2016-04-05 (×17): 1 via ORAL
  Filled 2016-03-23 (×20): qty 1

## 2016-03-23 NOTE — Progress Notes (Addendum)
Patient ID: [name], male   DOB: [birth year], 36 y.o.   MRN: [id number]  PROGRESS NOTE    [name]  [id number] DOB: [birth year] DOA: 03/20/2016  PCP: Lupe Carneyean Mitchell, MD   Brief Narrative:  36 y.o.malewith past medical history of morbi obesity, hypertension, sleep apnea, chronic venous stasis in LE. [facility] worsening shortness of breath at rest and with exertion over the past few days PTA. Patient stated he may have gained at least 70 pounds in the last 1 month prior to this presentation.    Assessment & Plan:   Volume overload - Unclear if due to CHF or compressive lymphedema  - Unable to adequately get ECHO results due to pt body habitus - Pt is negative 3 liters yesterday, negative 8.3 liters since admit - He is on lasix IV 40mg  q 8 hrs - Appreciate cardio following   Abdominal wall erythema / cellulitis  - On doxycyline   Hypokalemia - Due to lasix - Continue to supplement - Follow up BMP and magnesium in am  Essential hypertension - Continue lisinopril   Venous insufficiency/lymphedema with ulceration of greater than 1 year duration - Measurement:9cm x 20cm area of involvement with 0.8cm depth at the deepest point - Dressing procedure/placement/frequency: twice weekly (Tuesday/Friday) change schedule using a silver hydrofiber dressing as a wound contact layer and suggest home care be considered as a more viable option for routine dressing changes.  Morbid obesity due to excess calories  - Body mass index is 74.38 kg/m. - Seen by nutritionist - Counseled on diet and nutrition   Sleep apnea - CPAP at night    DVT prophylaxis: Lovenox subQ Code Status: full code  Family Communication: no family at the bedside this am Disposition Plan: home once cleared by cardio    Consultants:   Cardiology  WOC  Nutrition   Procedures:   2 D ECHO   Antimicrobials:   Doxycycline 03/20/2016 -->   Subjective: No overnight events.    Objective: Vitals:   03/22/16 2010 03/23/16 0100 03/23/16 0606 03/23/16 1423  BP: (!) 157/89  (!) 153/82 (!) 156/112  Pulse: 95  98 91  Resp: 20  18 20   Temp: 97.7 F (36.5 C)  97.7 F (36.5 C) 98 F (36.7 C)  TempSrc: Oral  Oral Oral  SpO2: 100%  98% 97%  Weight:   (!) 262.8 kg (579 lb 4.8 oz)   Height:  6\' 2"  (1.88 m)      Intake/Output Summary (Last 24 hours) at 03/23/16 1836 Last data filed at 03/23/16 1300  Gross per 24 hour  Intake              550 ml  Output             4025 ml  Net            -3475 ml   Filed Weights   03/22/16 0643 03/22/16 1151 03/23/16 0606  Weight: (!) 207.7 kg (458 lb) (!) 265.7 kg (585 lb 11.2 oz) (!) 262.8 kg (579 lb 4.8 oz)    Examination:  General exam: Appears calm and comfortable  Respiratory system: Diminished breath sounds, no wheezing  Cardiovascular system: S1 & S2 heard, Rate controlled  Gastrointestinal system: Abdomen is nondistended, soft and nontender. No organomegaly or masses felt. Normal bowel sounds heard. Central nervous system: Alert and oriented. No focal neurological deficits. Extremities: Symmetric 5 x 5 power. Lymphedema bilaterally, ace wrapping RLE Skin: Lymphedema with  venous stasis skin changes  Psychiatry: Judgement and insight appear normal. Mood & affect appropriate.   Data Reviewed: I have personally reviewed following labs and imaging studies  CBC:  Recent Labs Lab 03/20/16 0118 03/20/16 0556 03/22/16 0510  WBC 7.7 7.2 8.1  NEUTROABS 5.5 5.0  --   HGB 11.2* 10.8* 10.7*  HCT 34.9* 34.0* 33.5*  MCV 76.9* 76.7* 75.8*  PLT 240 224 239   Basic Metabolic Panel:  Recent Labs Lab 03/20/16 0118 03/20/16 0556 03/21/16 1853 03/22/16 0510 03/23/16 0553  NA 140 140 138 138 136  K 3.4* 3.1* 3.8 3.3* 3.8  CL 103 103 99* 97* 98*  CO2 27 26 31 31 30   GLUCOSE 104* 100* 106* 111* 105*  BUN 20 19 12 13 13   CREATININE 1.13 0.98 0.92 0.90 0.83  CALCIUM 8.9 9.0 9.2 9.0 8.6*  MG  --  1.9 1.9  --   --     GFR: Estimated Creatinine Clearance: 271.3 mL/min (by C-G formula based on SCr of 0.83 mg/dL). Liver Function Tests:  Recent Labs Lab 03/20/16 0556  AST 32  ALT 22  ALKPHOS 58  BILITOT 0.8  PROT 7.5  ALBUMIN 3.7   No results for input(s): LIPASE, AMYLASE in the last 168 hours. No results for input(s): AMMONIA in the last 168 hours. Coagulation Profile: No results for input(s): INR, PROTIME in the last 168 hours. Cardiac Enzymes:  Recent Labs Lab 03/20/16 0556 03/20/16 1214 03/20/16 1723  TROPONINI 0.09* 0.08* 0.08*   BNP (last 3 results) No results for input(s): PROBNP in the last 8760 hours. HbA1C: No results for input(s): HGBA1C in the last 72 hours. CBG: No results for input(s): GLUCAP in the last 168 hours. Lipid Profile: No results for input(s): CHOL, HDL, LDLCALC, TRIG, CHOLHDL, LDLDIRECT in the last 72 hours. Thyroid Function Tests:  Recent Labs  03/23/16 0910  TSH 5.569*   Anemia Panel: No results for input(s): VITAMINB12, FOLATE, FERRITIN, TIBC, IRON, RETICCTPCT in the last 72 hours. Urine analysis:    Component Value Date/Time   LABSPEC >=1.030 08/27/2014 1602   PHURINE 6.0 08/27/2014 1602   GLUCOSEU NEGATIVE 08/27/2014 1602   HGBUR TRACE (A) 08/27/2014 1602   BILIRUBINUR NEGATIVE 08/27/2014 1602   KETONESUR NEGATIVE 08/27/2014 1602   PROTEINUR 30 (A) 08/27/2014 1602   UROBILINOGEN 0.2 08/27/2014 1602   NITRITE NEGATIVE 08/27/2014 1602   LEUKOCYTESUR SMALL (A) 08/27/2014 1602   Sepsis Labs: @LABRCNTIP (procalcitonin:4,lacticidven:4)  MRSA PCR Screening     Status: None   Collection Time: 03/20/16  6:40 PM  Result Value Ref Range Status   MRSA by PCR NEGATIVE NEGATIVE Final      Radiology Studies: Dg Chest 2 View Result Date: 03/20/2016 Cardiac enlargement.  No evidence of active pulmonary disease.  Ct Angio Chest Pe W Or Wo Contrast Result Date: 03/20/2016 Limited diagnostic information, especially in the abdomen, related to large  patient size. The following conclusions can be made: - No central pulmonary embolus, with lobar and distal pulmonary arteries not well evaluated. - Negative aorta. Mild cardiomegaly is new since 2013. No pericardial effusion. - Pulmonary atelectasis, but no other lung abnormality or or pleural effusion. - No renal obstruction. - Chronic inguinal lymphadenopathy appears stable since 2013 although right external iliac artery adenopathy has progressed and is now moderate to severe (individual nodes up to 27 mm short axis). This is nonspecific. Sequelae of chronic infection/inflammation seems more likely than a systemic lymphoproliferative disorder or Lymphoma considering lack of thoracic and  no definite upper abdominal lymphadenopathy. - Chronic venous collaterals about the pelvis, stable since 2013. Electronically Signed   By: Odessa Fleming M.D.   On: 03/20/2016 13:56   Ct Abdomen Pelvis W Contrast Result Date: 03/20/2016 Limited diagnostic information, especially in the abdomen, related to large patient size. The following conclusions can be made: - No central pulmonary embolus, with lobar and distal pulmonary arteries not well evaluated. - Negative aorta. Mild cardiomegaly is new since 2013. No pericardial effusion. - Pulmonary atelectasis, but no other lung abnormality or or pleural effusion. - No renal obstruction. - Chronic inguinal lymphadenopathy appears stable since 2013 although right external iliac artery adenopathy has progressed and is now moderate to severe (individual nodes up to 27 mm short axis). This is nonspecific. Sequelae of chronic infection/inflammation seems more likely than a systemic lymphoproliferative disorder or Lymphoma considering lack of thoracic and no definite upper abdominal lymphadenopathy. - Chronic venous collaterals about the pelvis, stable since 2013.     Scheduled Meds: . doxycycline (VIBRAMYCIN) IV  100 mg Intravenous Q12H  . enoxaparin (LOVENOX) injection  100 mg  Subcutaneous Q24H  . furosemide  40 mg Intravenous Q8H  . lisinopril  20 mg Oral Daily  . potassium chloride  40 mEq Oral BID   Continuous Infusions:   LOS: 2 days    Time spent: 25 minutes  Greater than 50% of the time spent on counseling and coordinating the care.   Manson Passey, MD [facility] Pager [phone]  If 7PM-7AM, please contact night-coverage [email or web address] Password [facility] 03/23/2016, 6:36 PM

## 2016-03-23 NOTE — Progress Notes (Signed)
Dr Elissa HeftyHarding's note reviewed from yesterday. Echo was uninterpratable even with definitiy. Severe volume overload, likely will require several days of diuresis. Negative 3 liters yesterday, negative 8.3 liters since admit. He is on lasix IV 40mg  q 8hrs. Cr is stable. Isolated 5 beats of NSVT overnight, K is 3.3 and Mg 1.9. He is written to replace K. Continue IV diuresis, we will continue to follow peripherally over the weekend. Add TSH to AM labs.    Dominga FerryJ Valinda Fedie MD

## 2016-03-23 NOTE — Progress Notes (Signed)
Patient had a 5 beat run of Vtach. Asymptomatic.

## 2016-03-23 NOTE — Progress Notes (Signed)
Pt has home unit and prefers self placement.  RT to monitor and assess as needed.

## 2016-03-24 LAB — MAGNESIUM: Magnesium: 1.9 mg/dL (ref 1.7–2.4)

## 2016-03-24 LAB — BASIC METABOLIC PANEL
Anion gap: 8 (ref 5–15)
BUN: 14 mg/dL (ref 6–20)
CO2: 31 mmol/L (ref 22–32)
Calcium: 8.9 mg/dL (ref 8.9–10.3)
Chloride: 101 mmol/L (ref 101–111)
Creatinine, Ser: 0.91 mg/dL (ref 0.61–1.24)
GFR calc Af Amer: >60 mL/min (ref >60)
GLUCOSE: 113 mg/dL — AB (ref 65–99)
POTASSIUM: 3.6 mmol/L (ref 3.5–5.1)
Sodium: 140 mmol/L (ref 135–145)

## 2016-03-24 NOTE — Progress Notes (Signed)
Continued diuresis over the weekend. Negative 1.8 liters yesterday, negative 10 liters since admission. He is on lasix IV 40mg  every 8 hours. Renal function remains stable. No additional cardiology recs beyond continued IV diuresis at this time. Please call with questions.    Dina RichJonathan Branch MD

## 2016-03-24 NOTE — Progress Notes (Signed)
Patient ID: [name], male   DOB: Sep 21, 1980, 36 y.o.   MRN: [id number]  PROGRESS NOTE    [name]  YQM:[id number] DOB: 08-29-1980 DOA: 03/20/2016  PCP: Lupe Carney, MD   Brief Narrative:  36 y.o.malewith past medical history of morbi obesity, hypertension, sleep apnea, chronic venous stasis in LE. He presented to [facility] with worsening shortness of breath at rest and with exertion over the past few days PTA. Patient stated he may have gained at least 70 pounds in the last 1 month prior to this presentation.    Assessment & Plan:   Volume overload - Unclear if due to CHF or compressive lymphedema  - Unable to adequately get ECHO results due to pt body habitus - Pt is negative 1.8 L since yesterday - Per cardio, continue lasix IV 40mg  q 8 hrs  Abdominal wall erythema / cellulitis  - Continue doxycyline   Hypokalemia - Due to lasix - Continue to supplement - Follow up BMP in am  Essential hypertension - Continue lisinopril   Venous insufficiency/lymphedema with ulceration of greater than 1 year duration - Measurement:9cm x 20cm area of involvement with 0.8cm depth at the deepest point - Dressing procedure/placement/frequency: twice weekly (Tuesday/Friday) change schedule using a silver hydrofiber dressing as a wound contact layer and suggest home care be considered as a more viable option for routine dressing changes.  Morbid obesity due to excess calories  - Body mass index is 74.38 kg/m. - Seen by nutritionist - Counseled on diet and nutrition   Sleep apnea - CPAP at night    DVT prophylaxis: Lovenox subQ Code Status: full code  Family Communication: no family at the bedside this am Disposition Plan: home once adequately diuresed    Consultants:   Cardiology  WOC  Nutrition   Procedures:   2 D ECHO   Antimicrobials:   Doxycycline 03/20/2016 -->   Subjective: No overnight events.   Objective: Vitals:   03/23/16 2101 03/23/16 2127 03/23/16  2229 03/24/16 0438  BP:  (!) 163/98 (!) 158/77 (!) 158/94  Pulse:  94  98  Resp: 20 18  20   Temp:  97.8 F (36.6 C)  98.4 F (36.9 C)  TempSrc:  Oral  Oral  SpO2:  100%  98%  Weight:    (!) 259.4 kg (571 lb 14.4 oz)  Height:        Intake/Output Summary (Last 24 hours) at 03/24/16 1303 Last data filed at 03/24/16 5284  Gross per 24 hour  Intake                0 ml  Output              700 ml  Net             -700 ml   Filed Weights   03/22/16 1151 03/23/16 0606 03/24/16 0438  Weight: (!) 265.7 kg (585 lb 11.2 oz) (!) 262.8 kg (579 lb 4.8 oz) (!) 259.4 kg (571 lb 14.4 oz)    Examination:  General exam: Appears calm and comfortable, no distress  Respiratory system: Diminished breath sounds, no wheezing and no rhonchi  Cardiovascular system: S1 & S2 heard, Rate controlled  Gastrointestinal system: Abdomen is obese, (+) BS, non tender  Central nervous system: No focal neurological deficits. Extremities: Lymphedema bilaterally, ace wrapping RLE Skin: Lymphedema with venous stasis skin changes  Psychiatry: Mood & affect appropriate.   Data Reviewed: I have personally reviewed following labs and imaging  studies  CBC:  Recent Labs Lab 03/20/16 0118 03/20/16 0556 03/22/16 0510  WBC 7.7 7.2 8.1  NEUTROABS 5.5 5.0  --   HGB 11.2* 10.8* 10.7*  HCT 34.9* 34.0* 33.5*  MCV 76.9* 76.7* 75.8*  PLT 240 224 239   Basic Metabolic Panel:  Recent Labs Lab 03/20/16 0556 03/21/16 1853 03/22/16 0510 03/23/16 0553 03/24/16 0517  NA 140 138 138 136 140  K 3.1* 3.8 3.3* 3.8 3.6  CL 103 99* 97* 98* 101  CO2 26 31 31 30 31   GLUCOSE 100* 106* 111* 105* 113*  BUN 19 12 13 13 14   CREATININE 0.98 0.92 0.90 0.83 0.91  CALCIUM 9.0 9.2 9.0 8.6* 8.9  MG 1.9 1.9  --   --  1.9   GFR: Estimated Creatinine Clearance: 245.4 mL/min (by C-G formula based on SCr of 0.91 mg/dL). Liver Function Tests:  Recent Labs Lab 03/20/16 0556  AST 32  ALT 22  ALKPHOS 58  BILITOT 0.8  PROT 7.5    ALBUMIN 3.7   No results for input(s): LIPASE, AMYLASE in the last 168 hours. No results for input(s): AMMONIA in the last 168 hours. Coagulation Profile: No results for input(s): INR, PROTIME in the last 168 hours. Cardiac Enzymes:  Recent Labs Lab 03/20/16 0556 03/20/16 1214 03/20/16 1723  TROPONINI 0.09* 0.08* 0.08*   BNP (last 3 results) No results for input(s): PROBNP in the last 8760 hours. HbA1C: No results for input(s): HGBA1C in the last 72 hours. CBG: No results for input(s): GLUCAP in the last 168 hours. Lipid Profile: No results for input(s): CHOL, HDL, LDLCALC, TRIG, CHOLHDL, LDLDIRECT in the last 72 hours. Thyroid Function Tests:  Recent Labs  03/23/16 0910  TSH 5.569*   Anemia Panel: No results for input(s): VITAMINB12, FOLATE, FERRITIN, TIBC, IRON, RETICCTPCT in the last 72 hours. Urine analysis:    Component Value Date/Time   LABSPEC >=1.030 08/27/2014 1602   PHURINE 6.0 08/27/2014 1602   GLUCOSEU NEGATIVE 08/27/2014 1602   HGBUR TRACE (A) 08/27/2014 1602   BILIRUBINUR NEGATIVE 08/27/2014 1602   KETONESUR NEGATIVE 08/27/2014 1602   PROTEINUR 30 (A) 08/27/2014 1602   UROBILINOGEN 0.2 08/27/2014 1602   NITRITE NEGATIVE 08/27/2014 1602   LEUKOCYTESUR SMALL (A) 08/27/2014 1602   Sepsis Labs: @LABRCNTIP (procalcitonin:4,lacticidven:4)  MRSA PCR Screening     Status: None   Collection Time: 03/20/16  6:40 PM  Result Value Ref Range Status   MRSA by PCR NEGATIVE NEGATIVE Final      Radiology Studies: Dg Chest 2 View Result Date: 03/20/2016 Cardiac enlargement.  No evidence of active pulmonary disease.  Ct Angio Chest Pe W Or Wo Contrast Result Date: 03/20/2016 Limited diagnostic information, especially in the abdomen, related to large patient size. The following conclusions can be made: - No central pulmonary embolus, with lobar and distal pulmonary arteries not well evaluated. - Negative aorta. Mild cardiomegaly is new since 2013. No  pericardial effusion. - Pulmonary atelectasis, but no other lung abnormality or or pleural effusion. - No renal obstruction. - Chronic inguinal lymphadenopathy appears stable since 2013 although right external iliac artery adenopathy has progressed and is now moderate to severe (individual nodes up to 27 mm short axis). This is nonspecific. Sequelae of chronic infection/inflammation seems more likely than a systemic lymphoproliferative disorder or Lymphoma considering lack of thoracic and no definite upper abdominal lymphadenopathy. - Chronic venous collaterals about the pelvis, stable since 2013. Electronically Signed   By: Odessa Fleming M.D.   On: 03/20/2016  13:56   Ct Abdomen Pelvis W Contrast Result Date: 03/20/2016 Limited diagnostic information, especially in the abdomen, related to large patient size. The following conclusions can be made: - No central pulmonary embolus, with lobar and distal pulmonary arteries not well evaluated. - Negative aorta. Mild cardiomegaly is new since 2013. No pericardial effusion. - Pulmonary atelectasis, but no other lung abnormality or or pleural effusion. - No renal obstruction. - Chronic inguinal lymphadenopathy appears stable since 2013 although right external iliac artery adenopathy has progressed and is now moderate to severe (individual nodes up to 27 mm short axis). This is nonspecific. Sequelae of chronic infection/inflammation seems more likely than a systemic lymphoproliferative disorder or Lymphoma considering lack of thoracic and no definite upper abdominal lymphadenopathy. - Chronic venous collaterals about the pelvis, stable since 2013.     Scheduled Meds: . doxycycline (VIBRAMYCIN) IV  100 mg Intravenous Q12H  . enoxaparin (LOVENOX) injection  100 mg Subcutaneous Q24H  . furosemide  40 mg Intravenous Q8H  . lisinopril  20 mg Oral Daily  . potassium chloride  40 mEq Oral BID   Continuous Infusions:   LOS: 3 days    Time spent: 15 minutes  Greater than  50% of the time spent on counseling and coordinating the care.   Manson PasseyEVINE, Chamberlain Steinborn, MD [facility] Pager [phone]  If 7PM-7AM, please contact night-coverage [email or web address] Password [email or web address] 03/24/2016, 1:03 PM

## 2016-03-25 DIAGNOSIS — E876 Hypokalemia: Secondary | ICD-10-CM | POA: Diagnosis present

## 2016-03-25 DIAGNOSIS — I472 Ventricular tachycardia: Secondary | ICD-10-CM

## 2016-03-25 DIAGNOSIS — I4729 Other ventricular tachycardia: Secondary | ICD-10-CM

## 2016-03-25 LAB — BASIC METABOLIC PANEL
ANION GAP: 9 (ref 5–15)
BUN: 13 mg/dL (ref 6–20)
CALCIUM: 8.8 mg/dL — AB (ref 8.9–10.3)
CO2: 31 mmol/L (ref 22–32)
Chloride: 100 mmol/L — ABNORMAL LOW (ref 101–111)
Creatinine, Ser: 0.87 mg/dL (ref 0.61–1.24)
GFR calc Af Amer: >60 mL/min (ref >60)
Glucose, Bld: 102 mg/dL — ABNORMAL HIGH (ref 65–99)
POTASSIUM: 3.7 mmol/L (ref 3.5–5.1)
SODIUM: 140 mmol/L (ref 135–145)

## 2016-03-25 MED ORDER — LISINOPRIL 20 MG PO TABS
40.0000 mg | ORAL_TABLET | Freq: Every day | ORAL | Status: DC
  Filled 2016-03-25: qty 2

## 2016-03-25 MED ORDER — DOXYCYCLINE HYCLATE 100 MG PO TABS
100.0000 mg | ORAL_TABLET | Freq: Two times a day (BID) | ORAL | Status: AC
  Administered 2016-03-25 – 2016-03-27 (×5): 100 mg via ORAL
  Filled 2016-03-25 (×5): qty 1

## 2016-03-25 MED ORDER — POTASSIUM CHLORIDE CRYS ER 20 MEQ PO TBCR
40.0000 meq | EXTENDED_RELEASE_TABLET | Freq: Three times a day (TID) | ORAL | Status: DC
  Administered 2016-03-25 – 2016-04-05 (×32): 40 meq via ORAL
  Filled 2016-03-25 (×33): qty 2

## 2016-03-25 MED ORDER — HEPARIN SODIUM (PORCINE) 5000 UNIT/ML IJ SOLN
5000.0000 [IU] | Freq: Three times a day (TID) | INTRAMUSCULAR | Status: DC
  Administered 2016-03-25 – 2016-04-05 (×33): 5000 [IU] via SUBCUTANEOUS
  Filled 2016-03-25 (×33): qty 1

## 2016-03-25 MED ORDER — LISINOPRIL 20 MG PO TABS
40.0000 mg | ORAL_TABLET | Freq: Every day | ORAL | Status: DC
  Administered 2016-03-25 – 2016-04-05 (×12): 40 mg via ORAL
  Filled 2016-03-25 (×11): qty 2

## 2016-03-25 MED ORDER — CARVEDILOL 3.125 MG PO TABS
3.1250 mg | ORAL_TABLET | Freq: Two times a day (BID) | ORAL | Status: DC
  Administered 2016-03-25 – 2016-03-26 (×2): 3.125 mg via ORAL
  Filled 2016-03-25 (×2): qty 1

## 2016-03-25 NOTE — Progress Notes (Addendum)
Patient Name: [name] Date of Encounter: 03/25/2016  Primary Cardiologist: Dr. Kristeen Miss Problem List     Principal Problem:   Acute respiratory failure with hypoxia [facility]) Active Problems:   Venous stasis ulcer (HCC)   Morbid obesity (HCC)   Chronic acquired lymphedema   SOB (shortness of breath)   Hypertensive urgency   Abdominal pain   OSA (obstructive sleep apnea)   Acute respiratory failure (HCC)    Subjective   Patient breathing has improved and is now able to lay flat with slight elevation in the bed without discomfort  Continues to be diuresed. Denies an CP or increased lower extremity swelling.    Inpatient Medications    . doxycycline (VIBRAMYCIN) IV  100 mg Intravenous Q12H  . furosemide  40 mg Intravenous Q8H  . lisinopril  20 mg Oral Daily  . potassium chloride  40 mEq Oral BID    Vital Signs    Vitals:   03/24/16 0438 03/24/16 1438 03/24/16 2228 03/25/16 0607  BP: (!) 158/94 (!) 178/112 (!) 149/86 (!) 153/93  Pulse: 98 91 96 99  Resp: 20 20 20 20   Temp: 98.4 F (36.9 C) 98.6 F (37 C) 97.9 F (36.6 C) 97.8 F (36.6 C)  TempSrc: Oral  Oral Oral  SpO2: 98% 99% 100% 96%  Weight: (!) 571 lb 14.4 oz (259.4 kg)     Height:        Intake/Output Summary (Last 24 hours) at 03/25/16 0834 Last data filed at 03/25/16 0600  Gross per 24 hour  Intake             2290 ml  Output             4275 ml  Net            -1985 ml   Filed Weights   03/22/16 1151 03/23/16 0606 03/24/16 0438  Weight: (!) 585 lb 11.2 oz (265.7 kg) (!) 579 lb 4.8 oz (262.8 kg) (!) 571 lb 14.4 oz (259.4 kg)    Physical Exam    General: Well developed, well nourished morbidly obese in no acute distress. HEENT: Normocephalic, atraumatic, sclera non-icteric, no xanthomas, nares are without discharge. Neck: JVD difficult to assess given habitus. Supple. Lungs: Diminished throughout without wheezes, rales, or rhonchi. Breathing is unlabored. Patient lying flat.    Cardiac: RRR S1 S2 without murmurs, rubs, or gallops.  Abdomen: Soft. Tender over area of cellulitis in LLQ, with induration surrounding.Non-distended with normoactive bowel sounds. No rebound/guarding. Extremities: No clubbing or cyanosis. Diffuse edema, difficult to differentiate edema versus body habitus. Skin: Warm and dry. Neuro: Alert and oriented X 3. Sensation in tact. Follows commands. Psych:  Responds to questions appropriately with a normal affect  Labs    Basic Metabolic Panel  Recent Labs  03/24/16 0517 03/25/16 0501  NA 140 140  K 3.6 3.7  CL 101 100*  CO2 31 31  GLUCOSE 113* 102*  BUN 14 13  CREATININE 0.91 0.87  CALCIUM 8.9 8.8*  MG 1.9  --    Thyroid Function Tests  Recent Labs  03/23/16 0910  TSH 5.569*    Telemetry    Isolated 5 beats of NSVT over weekend.   Radiology    Dg Chest 2 View  Result Date: 03/20/2016 CLINICAL DATA:  Shortness of breath and abdominal swelling for 3 days. Chronic lymphedema. High blood pressure. EXAM: CHEST  2 VIEW COMPARISON:  12/26/2015 FINDINGS: Shallow inspiration. Borderline heart size  without vascular congestion or edema. No focal consolidation or airspace disease. No blunting of costophrenic angles. No pneumothorax. Calcified granuloma in the right lung. IMPRESSION: Cardiac enlargement.  No evidence of active pulmonary disease. Electronically Signed   By: Burman Nieves M.D.   On: 03/20/2016 01:39   Ct Angio Chest Pe W Or Wo Contrast  Result Date: 03/20/2016 CLINICAL DATA:  36 year old male with super morbid obesity (631 pounds). Increasing shortness of breath. Increasing weight and abdominal girth recently. Hypertensive at presentation. Initial encounter. EXAM: CT ANGIOGRAPHY CHEST CT ABDOMEN AND PELVIS WITH CONTRAST TECHNIQUE: Multidetector CT imaging of the chest was performed using the standard protocol during bolus administration of intravenous contrast. Multiplanar CT image reconstructions and MIPs were  obtained to evaluate the vascular anatomy. Multidetector CT imaging of the abdomen and pelvis was performed using the standard protocol during bolus administration of intravenous contrast. CONTRAST:  100 mL Isovue 370 COMPARISON:  CT Abdomen and Pelvis 11/05/2011, 05/09/2009 FINDINGS: CTA CHEST FINDINGS Cardiovascular: Suboptimal contrast bolus timing in the pulmonary arterial tree. Quantum mottle artifact. Mild respiratory motion artifact. No central pulmonary embolus. The lobar and distal pulmonary arteries are not well evaluated. Mild cardiomegaly is new since 2013. No pericardial effusion. Negative thoracic aorta. Mediastinum/Nodes: No lymphadenopathy. Lungs/Pleura: Major airways are patent except for atelectatic changes. There is a calcified granuloma in the right upper lobe. Lower lung volumes compared to 2013 with crowding of lung markings but no abnormal pulmonary opacity or pleural effusion. Musculoskeletal: No acute osseous abnormality identified. Review of the MIP images confirms the above findings. CT ABDOMEN and PELVIS FINDINGS Hepatobiliary: The liver appears stable and within normal limits. Gallbladder detail is obscured, but the gallbladder is not abnormally dilated. Pancreas: The pancreatic tail appears within normal limits, the body and head of the pancreas are obscured by quantum mottle. Spleen: The spleen appears stable and within normal limits. Adrenals/Urinary Tract: No hydronephrosis. On delayed images there is bilateral renal excretion of IV contrast. The urinary bladder is distended but otherwise appears normal. Stomach/Bowel: Negative rectum. The splenic flexure and distal transverse colon appear within normal limits. Proximal small bowel loops and the hepatic flexure appear within normal limits on the delayed excretory images. Other small and large bowel loops are obscured by quantum mottle. Negative proximal stomach. Vascular/Lymphatic: Numerous chronic venous collaterals about both  inguinal regions and the lower abdominal panniculus. Chronic right inguinal postoperative changes with numerous surgical clips, stable since 2013. Quantum mottle degrades detail of the abdominal aorta and iliac arteries, the abdominal aorta and proximal common iliac arteries appear within normal limits on the delayed excretory phase images. Chronically enlarged bilateral inguinal lymph nodes appear not significantly changed since 2013. There is evidence of right greater than left external iliac artery lymphadenopathy which has increased since 2013 with individual nodes measuring up to 27 mm short axis (previously 18 mm). The mesenteric and retroperitoneal spaces in the abdomen and pelvis are poorly visualized due to quantum mottle. It is difficult to exclude retroperitoneal lymphadenopathy in the upper abdomen. Reproductive: Negative. Other: No pelvic free fluid. Musculoskeletal: Lumbar and pelvic bone detail degraded by body habitus/quantum mottle. No acute osseous abnormality identified. Review of the MIP images confirms the above findings. IMPRESSION: Limited diagnostic information, especially in the abdomen, related to large patient size. The following conclusions can be made: - No central pulmonary embolus, with lobar and distal pulmonary arteries not well evaluated. - Negative aorta. Mild cardiomegaly is new since 2013. No pericardial effusion. - Pulmonary atelectasis, but no other  lung abnormality or or pleural effusion. - No renal obstruction. - Chronic inguinal lymphadenopathy appears stable since 2013 although right external iliac artery adenopathy has progressed and is now moderate to severe (individual nodes up to 27 mm short axis). This is nonspecific. Sequelae of chronic infection/inflammation seems more likely than a systemic lymphoproliferative disorder or Lymphoma considering lack of thoracic and no definite upper abdominal lymphadenopathy. - Chronic venous collaterals about the pelvis, stable since  2013. Electronically Signed   By: Odessa Fleming M.D.   On: 03/20/2016 13:56   Ct Abdomen Pelvis W Contrast  Result Date: 03/20/2016 CLINICAL DATA:  36 year old male with super morbid obesity (631 pounds). Increasing shortness of breath. Increasing weight and abdominal girth recently. Hypertensive at presentation. Initial encounter. EXAM: CT ANGIOGRAPHY CHEST CT ABDOMEN AND PELVIS WITH CONTRAST TECHNIQUE: Multidetector CT imaging of the chest was performed using the standard protocol during bolus administration of intravenous contrast. Multiplanar CT image reconstructions and MIPs were obtained to evaluate the vascular anatomy. Multidetector CT imaging of the abdomen and pelvis was performed using the standard protocol during bolus administration of intravenous contrast. CONTRAST:  100 mL Isovue 370 COMPARISON:  CT Abdomen and Pelvis 11/05/2011, 05/09/2009 FINDINGS: CTA CHEST FINDINGS Cardiovascular: Suboptimal contrast bolus timing in the pulmonary arterial tree. Quantum mottle artifact. Mild respiratory motion artifact. No central pulmonary embolus. The lobar and distal pulmonary arteries are not well evaluated. Mild cardiomegaly is new since 2013. No pericardial effusion. Negative thoracic aorta. Mediastinum/Nodes: No lymphadenopathy. Lungs/Pleura: Major airways are patent except for atelectatic changes. There is a calcified granuloma in the right upper lobe. Lower lung volumes compared to 2013 with crowding of lung markings but no abnormal pulmonary opacity or pleural effusion. Musculoskeletal: No acute osseous abnormality identified. Review of the MIP images confirms the above findings. CT ABDOMEN and PELVIS FINDINGS Hepatobiliary: The liver appears stable and within normal limits. Gallbladder detail is obscured, but the gallbladder is not abnormally dilated. Pancreas: The pancreatic tail appears within normal limits, the body and head of the pancreas are obscured by quantum mottle. Spleen: The spleen appears  stable and within normal limits. Adrenals/Urinary Tract: No hydronephrosis. On delayed images there is bilateral renal excretion of IV contrast. The urinary bladder is distended but otherwise appears normal. Stomach/Bowel: Negative rectum. The splenic flexure and distal transverse colon appear within normal limits. Proximal small bowel loops and the hepatic flexure appear within normal limits on the delayed excretory images. Other small and large bowel loops are obscured by quantum mottle. Negative proximal stomach. Vascular/Lymphatic: Numerous chronic venous collaterals about both inguinal regions and the lower abdominal panniculus. Chronic right inguinal postoperative changes with numerous surgical clips, stable since 2013. Quantum mottle degrades detail of the abdominal aorta and iliac arteries, the abdominal aorta and proximal common iliac arteries appear within normal limits on the delayed excretory phase images. Chronically enlarged bilateral inguinal lymph nodes appear not significantly changed since 2013. There is evidence of right greater than left external iliac artery lymphadenopathy which has increased since 2013 with individual nodes measuring up to 27 mm short axis (previously 18 mm). The mesenteric and retroperitoneal spaces in the abdomen and pelvis are poorly visualized due to quantum mottle. It is difficult to exclude retroperitoneal lymphadenopathy in the upper abdomen. Reproductive: Negative. Other: No pelvic free fluid. Musculoskeletal: Lumbar and pelvic bone detail degraded by body habitus/quantum mottle. No acute osseous abnormality identified. Review of the MIP images confirms the above findings. IMPRESSION: Limited diagnostic information, especially in the abdomen, related to large  patient size. The following conclusions can be made: - No central pulmonary embolus, with lobar and distal pulmonary arteries not well evaluated. - Negative aorta. Mild cardiomegaly is new since 2013. No  pericardial effusion. - Pulmonary atelectasis, but no other lung abnormality or or pleural effusion. - No renal obstruction. - Chronic inguinal lymphadenopathy appears stable since 2013 although right external iliac artery adenopathy has progressed and is now moderate to severe (individual nodes up to 27 mm short axis). This is nonspecific. Sequelae of chronic infection/inflammation seems more likely than a systemic lymphoproliferative disorder or Lymphoma considering lack of thoracic and no definite upper abdominal lymphadenopathy. - Chronic venous collaterals about the pelvis, stable since 2013. Electronically Signed   By: Odessa FlemingH  Hall M.D.   On: 03/20/2016 13:56     Patient Profile    36 y.o.with chronic venous insufficiency s/p venous bypass, DM, morbid obesity, hypertension, sleep apnea, and lymphedema  [facility] swelling, SOB, and progressive weight gain (previously 520's in 12/2015). Volume overload felt due to compressive lymphedema vs. CHF. 2D echo uninterpretable, even with definity.   Assessment & Plan   1. Volume overload/elevated troponin - unclear if due to CHF or compressive lymphedema. 2D echo uninterpretable; patient would be too large for accurate assessment by other noninvasive testing (i.e. cMRI, coronary CTA, nuc). Pt is negative 2 L since yesterday and down 12L since admit. Weights extremely variablein Epic. Renal function remains stable. Potassium 3.7. Continue  lasix IV 40mg  q 8 hrs. More than likely will need to follow BUN/Cr and CO to guide endpoint of diuresis as his volume is almost impossible to assess given his habitus.   2. Essential HTN/hypertensive urgency- follow with diuresis. Consider initiation of carvedilol today. There is also room to titrate ACEI but want to follow renal function with aggressive diuresis.  3. Hypokalemia - Increase KCl to TID. Mg wnl 1/14.  3. Morbid obesity - Life-threatening at this point. Strongly encourage continued  f/u with bariatric surgery. We discussed risks of Hoen-term affects. Seen by nutritionist and counseled on diet and nutrition. TSH mildly elevated at 5.569.  4. Anemia - per IM.  5. Abdominal wall erythema/cellulitis- per IM.  6. Sleep apnea - CPAP at night.   7. Venous insufficiency/lymphedema with ulceration - Per IM. Patient stated that wound is still draining and would like dressing re-evaluated. Further per IM.  8. NSVT - 5 beats over the weekend. Replete K. Will review addition of BB with MD.   Signed, Jacinto HalimMichael M Maczis, Student-PA  03/25/2016, 8:34 AM   Patient seen/examined independently of PA Student, note and physical exam amended above with my findings and plan. Dayna Dunn PA-C  As above, the patient seen and examined. He denies chest pain in his dyspnea is improving. He is massively obese and exam is difficult but he continues to be markedly volume overloaded. We will continue present dose of Lasix and follow renal function. His LV function could not be quantitated with echocardiogram. We will consider repeating in the future. Blood pressure remains elevated. Increase lisinopril to 40 mg daily. Add carvedilol 3.125 mg twice a day. Advance medications as needed. I discussed weight loss and I consider his obesity life-threatening.   Olga MillersBrian Marshaun Lortie, MD

## 2016-03-25 NOTE — Consult Note (Signed)
WOC Nurse wound follow up Wound type: venous insufficiency vs lymphedema Measurement:Not measured today, see measurements taken on Friday, 03/22/16 Wound bed:red, moist, free of necrotic tissue. Drainage (amount, consistency, odor) scant drainage noted on exterior of dressings.  Patient has removed part of Unna's boot; we will need to replace today instead of tomorrow.  Patient taught that change schedule for Unna's boots will be twice weekly and that his schedule is now Monday/Thursday. Periwound: Intact, dry Dressing procedure/placement/frequency: LEs washed and dried, wound care performed using silver hydrofiber.  Ortho tech to place [employer] today and Thursday. WOC nursing team will not follow, but will remain available to this patient, the nursing and medical teams.  Please re-consult if needed. Thanks, Ladona MowLaurie Willamae Demby, MSN, RN, GNP, Hans EdenCWOCN, CWON-AP, FAAN  Pager# [phone]

## 2016-03-25 NOTE — Progress Notes (Signed)
Patient ID: [name], male   DOB: [birth year], 36 y.o.   MRN: [id number]  PROGRESS NOTE    [name]  WUJ:[id number] DOB: [birth year] DOA: 03/20/2016  PCP: Lupe Carney, MD   Brief Narrative:  36 y.o.malewith past medical history of morbi obesity, hypertension, sleep apnea, chronic venous stasis in LE. He presented to [facility] with worsening shortness of breath at rest and with exertion over the past few days PTA. Patient stated he may have gained at least 70 pounds in the last 1 month prior to this presentation.    Assessment & Plan:   Volume overload - Unclear if due to CHF or compressive lymphedema  - Unable to adequately get ECHO results due to pt body habitus - Weight 1/13 was 579 lbs; weight this am is 563 lbs - Continue IV lasix 40 mg Q 8 hours - Appreciate cardio following   Abdominal wall erythema / cellulitis  - Continue doxycyline through 1/17   Hypokalemia - Due to lasix - Continue to supplement  Essential hypertension - Continue lisinopril  - BP 153/93  Venous insufficiency/lymphedema with ulceration of greater than 1 year duration - Measurement:9cm x 20cm area of involvement with 0.8cm depth at the deepest point - Dressing procedure/placement/frequency: twice weekly (Tuesday/Friday) change schedule using a silver hydrofiber dressing as a wound contact layer and suggest home care be considered as a more viable option for routine dressing changes.  Morbid obesity due to excess calories  - Body mass index is 74.38 kg/m. - Seen by nutritionist - Counseled on diet and nutrition   Sleep apnea - CPAP at night    DVT prophylaxis: Lovenox subQ Code Status: full code  Family Communication: no family at the bedside this am Disposition Plan: home once adequately diuresed    Consultants:   Cardiology  WOC  Nutrition   Procedures:   2 D ECHO - unable to adequately assess due to pt body habitus   Antimicrobials:   Doxycycline 03/20/2016  -->   Subjective: No overnight events.   Objective: Vitals:   03/24/16 1438 03/24/16 2228 03/25/16 0607 03/25/16 0906  BP: (!) 178/112 (!) 149/86 (!) 153/93   Pulse: 91 96 99   Resp: 20 20 20    Temp: 98.6 F (37 C) 97.9 F (36.6 C) 97.8 F (36.6 C)   TempSrc:  Oral Oral   SpO2: 99% 100% 96%   Weight:    (!) 255.6 kg (563 lb 8 oz)  Height:        Intake/Output Summary (Last 24 hours) at 03/25/16 1221 Last data filed at 03/25/16 0900  Gross per 24 hour  Intake             2640 ml  Output             4575 ml  Net            -1935 ml   Filed Weights   03/23/16 0606 03/24/16 0438 03/25/16 0906  Weight: (!) 262.8 kg (579 lb 4.8 oz) (!) 259.4 kg (571 lb 14.4 oz) (!) 255.6 kg (563 lb 8 oz)    Examination:  General exam: No distress  Respiratory system: Diminished breath sounds, no wheezing Cardiovascular system: S1 & S2 heard, Rate controlled  Gastrointestinal system: obese, (+) BS Central nervous system: Nonfocal  Extremities: Lymphedema bilaterally, ace wrapping RLE Skin: Lymphedema with venous stasis skin changes  Psychiatry: Normal mood and behavior   Data Reviewed: I have personally reviewed following labs and imaging  studies  CBC:  Recent Labs Lab 03/20/16 0118 03/20/16 0556 03/22/16 0510  WBC 7.7 7.2 8.1  NEUTROABS 5.5 5.0  --   HGB 11.2* 10.8* 10.7*  HCT 34.9* 34.0* 33.5*  MCV 76.9* 76.7* 75.8*  PLT 240 224 239   Basic Metabolic Panel:  Recent Labs Lab 03/20/16 0556 03/21/16 1853 03/22/16 0510 03/23/16 0553 03/24/16 0517 03/25/16 0501  NA 140 138 138 136 140 140  K 3.1* 3.8 3.3* 3.8 3.6 3.7  CL 103 99* 97* 98* 101 100*  CO2 26 31 31 30 31 31   GLUCOSE 100* 106* 111* 105* 113* 102*  BUN 19 12 13 13 14 13   CREATININE 0.98 0.92 0.90 0.83 0.91 0.87  CALCIUM 9.0 9.2 9.0 8.6* 8.9 8.8*  MG 1.9 1.9  --   --  1.9  --    GFR: Estimated Creatinine Clearance: 254.1 mL/min (by C-G formula based on SCr of 0.87 mg/dL). Liver Function Tests:  Recent  Labs Lab 03/20/16 0556  AST 32  ALT 22  ALKPHOS 58  BILITOT 0.8  PROT 7.5  ALBUMIN 3.7   No results for input(s): LIPASE, AMYLASE in the last 168 hours. No results for input(s): AMMONIA in the last 168 hours. Coagulation Profile: No results for input(s): INR, PROTIME in the last 168 hours. Cardiac Enzymes:  Recent Labs Lab 03/20/16 0556 03/20/16 1214 03/20/16 1723  TROPONINI 0.09* 0.08* 0.08*   BNP (last 3 results) No results for input(s): PROBNP in the last 8760 hours. HbA1C: No results for input(s): HGBA1C in the last 72 hours. CBG: No results for input(s): GLUCAP in the last 168 hours. Lipid Profile: No results for input(s): CHOL, HDL, LDLCALC, TRIG, CHOLHDL, LDLDIRECT in the last 72 hours. Thyroid Function Tests:  Recent Labs  03/23/16 0910  TSH 5.569*   Anemia Panel: No results for input(s): VITAMINB12, FOLATE, FERRITIN, TIBC, IRON, RETICCTPCT in the last 72 hours. Urine analysis:    Component Value Date/Time   LABSPEC >=1.030 08/27/2014 1602   PHURINE 6.0 08/27/2014 1602   GLUCOSEU NEGATIVE 08/27/2014 1602   HGBUR TRACE (A) 08/27/2014 1602   BILIRUBINUR NEGATIVE 08/27/2014 1602   KETONESUR NEGATIVE 08/27/2014 1602   PROTEINUR 30 (A) 08/27/2014 1602   UROBILINOGEN 0.2 08/27/2014 1602   NITRITE NEGATIVE 08/27/2014 1602   LEUKOCYTESUR SMALL (A) 08/27/2014 1602   Sepsis Labs: @LABRCNTIP (procalcitonin:4,lacticidven:4)  MRSA PCR Screening     Status: None   Collection Time: 03/20/16  6:40 PM  Result Value Ref Range Status   MRSA by PCR NEGATIVE NEGATIVE Final      Radiology Studies: Dg Chest 2 View Result Date: 03/20/2016 Cardiac enlargement.  No evidence of active pulmonary disease.  Ct Angio Chest Pe W Or Wo Contrast Result Date: 03/20/2016 Limited diagnostic information, especially in the abdomen, related to large patient size. The following conclusions can be made: - No central pulmonary embolus, with lobar and distal pulmonary arteries not  well evaluated. - Negative aorta. Mild cardiomegaly is new since 2013. No pericardial effusion. - Pulmonary atelectasis, but no other lung abnormality or or pleural effusion. - No renal obstruction. - Chronic inguinal lymphadenopathy appears stable since 2013 although right external iliac artery adenopathy has progressed and is now moderate to severe (individual nodes up to 27 mm short axis). This is nonspecific. Sequelae of chronic infection/inflammation seems more likely than a systemic lymphoproliferative disorder or Lymphoma considering lack of thoracic and no definite upper abdominal lymphadenopathy. - Chronic venous collaterals about the pelvis, stable since 2013. Electronically  Signed   By: Odessa FlemingH  Hall M.D.   On: 03/20/2016 13:56   Ct Abdomen Pelvis W Contrast Result Date: 03/20/2016 Limited diagnostic information, especially in the abdomen, related to large patient size. The following conclusions can be made: - No central pulmonary embolus, with lobar and distal pulmonary arteries not well evaluated. - Negative aorta. Mild cardiomegaly is new since 2013. No pericardial effusion. - Pulmonary atelectasis, but no other lung abnormality or or pleural effusion. - No renal obstruction. - Chronic inguinal lymphadenopathy appears stable since 2013 although right external iliac artery adenopathy has progressed and is now moderate to severe (individual nodes up to 27 mm short axis). This is nonspecific. Sequelae of chronic infection/inflammation seems more likely than a systemic lymphoproliferative disorder or Lymphoma considering lack of thoracic and no definite upper abdominal lymphadenopathy. - Chronic venous collaterals about the pelvis, stable since 2013.     Scheduled Meds: . carvedilol  3.125 mg Oral BID WC  . doxycycline  100 mg Oral BID AC  . furosemide  40 mg Intravenous Q8H  . heparin subcutaneous  5,000 Units Subcutaneous Q8H  . lisinopril  40 mg Oral Daily  . potassium chloride  40 mEq Oral TID    Continuous Infusions:   LOS: 4 days    Time spent: 25 minutes  Greater than 50% of the time spent on counseling and coordinating the care.   Manson PasseyEVINE, Baylyn Sickles, MD [facility] Pager [phone]  If 7PM-7AM, please contact night-coverage [email or web address] Password [email or web address] 03/25/2016, 12:21 PM

## 2016-03-25 NOTE — Progress Notes (Signed)
PHARMACIST - PHYSICIAN COMMUNICATION DR:   Elisabeth Pigeonevine CONCERNING: Antibiotic IV to Oral Route Change Policy  RECOMMENDATION: This patient is receiving Doxycycline by the intravenous route.  Based on criteria approved by the Pharmacy and Therapeutics Committee, the antibiotic(s) is/are being converted to the equivalent oral dose form(s).   DESCRIPTION: These criteria include:  Patient being treated for a respiratory tract infection, urinary tract infection, cellulitis or clostridium difficile associated diarrhea if on metronidazole  The patient is not neutropenic and does not exhibit a GI malabsorption state  The patient is eating (either orally or via tube) and/or has been taking other orally administered medications for a least 24 hours  The patient is improving clinically and has a Tmax < 100.5  If you have questions about this conversion, please contact the Pharmacy Department  []   [phone]( [phone] )  [facility] []   [phone]( [phone] )  [facility] []   [phone]( [phone] )  [facility] []   [phone]( [phone] )  [facility] [x]   [phone]( [phone] )  [facility]   Junita PushMichelle Jennae Hakeem, PharmD, BCPS Pager: [phone] 03/25/2016@10 :22 AM

## 2016-03-26 DIAGNOSIS — R601 Generalized edema: Secondary | ICD-10-CM

## 2016-03-26 LAB — BASIC METABOLIC PANEL
ANION GAP: 9 (ref 5–15)
BUN: 14 mg/dL (ref 6–20)
CALCIUM: 8.8 mg/dL — AB (ref 8.9–10.3)
CO2: 30 mmol/L (ref 22–32)
Chloride: 100 mmol/L — ABNORMAL LOW (ref 101–111)
Creatinine, Ser: 0.88 mg/dL (ref 0.61–1.24)
GFR calc non Af Amer: >60 mL/min (ref >60)
Glucose, Bld: 113 mg/dL — ABNORMAL HIGH (ref 65–99)
Potassium: 4.1 mmol/L (ref 3.5–5.1)
SODIUM: 139 mmol/L (ref 135–145)

## 2016-03-26 MED ORDER — CARVEDILOL 6.25 MG PO TABS
6.2500 mg | ORAL_TABLET | Freq: Two times a day (BID) | ORAL | Status: DC
  Administered 2016-03-26 – 2016-04-03 (×16): 6.25 mg via ORAL
  Filled 2016-03-26 (×16): qty 1

## 2016-03-26 MED ORDER — FUROSEMIDE 10 MG/ML IJ SOLN
120.0000 mg | Freq: Two times a day (BID) | INTRAVENOUS | Status: DC
  Administered 2016-03-26 – 2016-03-28 (×4): 120 mg via INTRAVENOUS
  Filled 2016-03-26: qty 2
  Filled 2016-03-26 (×3): qty 10

## 2016-03-26 MED ORDER — SPIRONOLACTONE 25 MG PO TABS
25.0000 mg | ORAL_TABLET | Freq: Every day | ORAL | Status: DC
  Administered 2016-03-26 – 2016-04-05 (×11): 25 mg via ORAL
  Filled 2016-03-26 (×11): qty 1

## 2016-03-26 NOTE — Evaluation (Signed)
Physical Therapy Evaluation Patient Details Name: [name] MRN: [id number] DOB: 1980-05-30 Today's Date: 03/26/2016   History of Present Illness  36 yo male admitted with acute respiratory failure. Hx of morbid obesity, HTN, chronic lymphedema, chronic wounds  Clinical Impression  On eval, pt was Min guard assist for mobility. He walked ~150 feet with a RW. Pt tolerated activity well. Recommend daily ambulation with nursing supervision. Will follow.     Follow Up Recommendations Supervision for mobility/OOB;No PT follow up    Equipment Recommendations  None recommended by PT    Recommendations for Other Services       Precautions / Restrictions Precautions Precautions: Fall Restrictions Weight Bearing Restrictions: No      Mobility  Bed Mobility               General bed mobility comments: oob in recliner  Transfers Overall transfer level: Needs assistance   Transfers: Sit to/from Stand Sit to Stand: Min guard         General transfer comment: Pt uses momentum. close guard for safety.   Ambulation/Gait Ambulation/Gait assistance: Min guard Ambulation Distance (Feet): 150 Feet Assistive device: Rolling walker (2 wheeled) Gait Pattern/deviations: Step-through pattern;Wide base of support     General Gait Details: close guard for safety.   Stairs            Wheelchair Mobility    Modified Rankin (Stroke Patients Only)       Balance                                             Pertinent Vitals/Pain Pain Assessment: No/denies pain    Home Living Family/patient expects to be discharged to:: Private residence Living Arrangements: Alone Available Help at Discharge: Family;Available PRN/intermittently Type of Home: House Home Access: Stairs to enter   [employer] Corporation[employer]trance Stairs-Number of Steps: 1 Home Layout: One level Home Equipment: None      Prior Function Level of Independence: Independent               Hand  Dominance        Extremity/Trunk Assessment   Upper Extremity Assessment Upper Extremity Assessment: Overall WFL for tasks assessed    Lower Extremity Assessment Lower Extremity Assessment: Generalized weakness (bil LE dressings)    Cervical / Trunk Assessment Cervical / Trunk Assessment: Normal  Communication   Communication: No difficulties  Cognition Arousal/Alertness: Awake/alert Behavior During Therapy: WFL for tasks assessed/performed Overall Cognitive Status: Within Functional Limits for tasks assessed                      General Comments      Exercises     Assessment/Plan    PT Assessment Patient needs continued PT services  PT Problem List Decreased mobility;Decreased knowledge of use of DME;Decreased strength;Decreased activity tolerance;Obesity          PT Treatment Interventions Gait training;Therapeutic activities;Therapeutic exercise;Patient/family education;Functional mobility training    PT Goals (Current goals can be found in the Care Plan section)  Acute Rehab PT Goals Patient Stated Goal: none stated PT Goal Formulation: With patient Time For Goal Achievement: 04/09/16 Potential to Achieve Goals: Good    Frequency Min 2X/week   Barriers to discharge        Co-evaluation  End of Session   Activity Tolerance: Patient tolerated treatment well Patient left: in chair;with call bell/phone within reach           Time: 1610-9604 PT Time Calculation (min) (ACUTE ONLY): 13 min   Charges:   PT Evaluation $PT Eval Low Complexity: 1 Procedure     PT G Codes:        Rebeca Alert, MPT Pager: [phone]

## 2016-03-26 NOTE — Progress Notes (Signed)
Patient ID: [name], male   DOB: [birth year], 36 y.o.   MRN: [id number]  PROGRESS NOTE    [name]  ION:[id number] DOB: [birth year] DOA: 03/20/2016  PCP: Lupe Carneyean Mitchell, MD   Brief Narrative:  36 y.o.malewith past medical history of morbi obesity, hypertension, sleep apnea, chronic venous stasis in LE. [facility] shortness of breath at rest and with exertion over the past few days PTA. Patient stated he may have gained at least 70 pounds in the last 1 month prior to this presentation.    Assessment & Plan:   Volume overload - Unclear if due to CHF or compressive lymphedema  - Unable to adequately get ECHO results due to pt body habitus - Weight 1/13 was 579 lbs; weight this am is 560 lbs - Continue IV lasix 40 mg Q 8 hours - Appreciate cardio following and their recommendations   Abdominal wall erythema / cellulitis  - Continue doxycyline through 1/17   Hypokalemia - Due to lasix - Continue to supplement  Essential hypertension - Continue lisinopril but per cardio increased to 40 mg daily and added coreg 3.125 mg BID  Venous insufficiency/lymphedema with ulceration of greater than 1 year duration - Measurement:9cm x 20cm area of involvement with 0.8cm depth at the deepest point - Dressing procedure/placement/frequency: twice weekly (Tuesday/Friday) change schedule using a silver hydrofiber dressing as a wound contact layer and suggest home care be considered as a more viable option for routine dressing changes.  Morbid obesity due to excess calories  - Body mass index is 74.38 kg/m. - Seen by nutritionist - Counseled on diet and nutrition   Sleep apnea - CPAP at night    DVT prophylaxis: Heparin subQ Code Status: full code  Family Communication: no family at the bedside this am Disposition Plan: home once adequately diuresed    Consultants:   Cardiology  WOC  Nutrition   Procedures:   2 D ECHO - unable to adequately assess  due to pt body habitus   Antimicrobials:   Doxycycline 03/20/2016 -->   Subjective: No overnight events.   Objective: Vitals:   03/25/16 0906 03/25/16 1422 03/25/16 2137 03/26/16 0455  BP:  (!) 155/80 (!) 145/75 139/83  Pulse:  95 90 (!) 102  Resp:  20 20 18   Temp:  98.8 F (37.1 C) 98.3 F (36.8 C) 99.3 F (37.4 C)  TempSrc:  Oral Oral Oral  SpO2:  99% 98% 100%  Weight: (!) 255.6 kg (563 lb 8 oz)   (!) 254.2 kg (560 lb 8 oz)  Height:        Intake/Output Summary (Last 24 hours) at 03/26/16 0913 Last data filed at 03/26/16 0600  Gross per 24 hour  Intake              620 ml  Output             2600 ml  Net            -1980 ml   Filed Weights   03/24/16 0438 03/25/16 0906 03/26/16 0455  Weight: (!) 259.4 kg (571 lb 14.4 oz) (!) 255.6 kg (563 lb 8 oz) (!) 254.2 kg (560 lb 8 oz)    Examination:  General exam: No distress, on CPAP Respiratory system: Diminished breath sounds, no wheezing Cardiovascular system: S1 & S2 heard, RRR Gastrointestinal system: obese, (+) BS, non tender abdomen  Central nervous system: No focal deficits  Extremities: Lymphedema bilaterally, ace wrapping RLE  Skin: Lymphedema with venous stasis skin changes  Psychiatry: Normal mood and behavior, no agitation or restlessness   Data Reviewed: I have personally reviewed following labs and imaging studies  CBC:  Recent Labs Lab 03/20/16 0118 03/20/16 0556 03/22/16 0510  WBC 7.7 7.2 8.1  NEUTROABS 5.5 5.0  --   HGB 11.2* 10.8* 10.7*  HCT 34.9* 34.0* 33.5*  MCV 76.9* 76.7* 75.8*  PLT 240 224 239   Basic Metabolic Panel:  Recent Labs Lab 03/20/16 0556 03/21/16 1853 03/22/16 0510 03/23/16 0553 03/24/16 0517 03/25/16 0501 03/26/16 0601  NA 140 138 138 136 140 140 139  K 3.1* 3.8 3.3* 3.8 3.6 3.7 4.1  CL 103 99* 97* 98* 101 100* 100*  CO2 26 31 31 30 31 31 30   GLUCOSE 100* 106* 111* 105* 113* 102* 113*  BUN 19 12 13 13 14 13 14   CREATININE 0.98 0.92 0.90 0.83 0.91 0.87 0.88    CALCIUM 9.0 9.2 9.0 8.6* 8.9 8.8* 8.8*  MG 1.9 1.9  --   --  1.9  --   --    GFR: Estimated Creatinine Clearance: 250.2 mL/min (by C-G formula based on SCr of 0.88 mg/dL). Liver Function Tests:  Recent Labs Lab 03/20/16 0556  AST 32  ALT 22  ALKPHOS 58  BILITOT 0.8  PROT 7.5  ALBUMIN 3.7   No results for input(s): LIPASE, AMYLASE in the last 168 hours. No results for input(s): AMMONIA in the last 168 hours. Coagulation Profile: No results for input(s): INR, PROTIME in the last 168 hours. Cardiac Enzymes:  Recent Labs Lab 03/20/16 0556 03/20/16 1214 03/20/16 1723  TROPONINI 0.09* 0.08* 0.08*   BNP (last 3 results) No results for input(s): PROBNP in the last 8760 hours. HbA1C: No results for input(s): HGBA1C in the last 72 hours. CBG: No results for input(s): GLUCAP in the last 168 hours. Lipid Profile: No results for input(s): CHOL, HDL, LDLCALC, TRIG, CHOLHDL, LDLDIRECT in the last 72 hours. Thyroid Function Tests: No results for input(s): TSH, T4TOTAL, FREET4, T3FREE, THYROIDAB in the last 72 hours. Anemia Panel: No results for input(s): VITAMINB12, FOLATE, FERRITIN, TIBC, IRON, RETICCTPCT in the last 72 hours. Urine analysis:    Component Value Date/Time   LABSPEC >=1.030 08/27/2014 1602   PHURINE 6.0 08/27/2014 1602   GLUCOSEU NEGATIVE 08/27/2014 1602   HGBUR TRACE (A) 08/27/2014 1602   BILIRUBINUR NEGATIVE 08/27/2014 1602   KETONESUR NEGATIVE 08/27/2014 1602   PROTEINUR 30 (A) 08/27/2014 1602   UROBILINOGEN 0.2 08/27/2014 1602   NITRITE NEGATIVE 08/27/2014 1602   LEUKOCYTESUR SMALL (A) 08/27/2014 1602   Sepsis Labs: @LABRCNTIP (procalcitonin:4,lacticidven:4)  MRSA PCR Screening     Status: None   Collection Time: 03/20/16  6:40 PM  Result Value Ref Range Status   MRSA by PCR NEGATIVE NEGATIVE Final      Radiology Studies: Dg Chest 2 View Result Date: 03/20/2016 Cardiac enlargement.  No evidence of active pulmonary disease.  Ct Angio Chest Pe  W Or Wo Contrast Result Date: 03/20/2016 Limited diagnostic information, especially in the abdomen, related to large patient size. The following conclusions can be made: - No central pulmonary embolus, with lobar and distal pulmonary arteries not well evaluated. - Negative aorta. Mild cardiomegaly is new since 2013. No pericardial effusion. - Pulmonary atelectasis, but no other lung abnormality or or pleural effusion. - No renal obstruction. - Chronic inguinal lymphadenopathy appears stable since 2013 although right external iliac artery adenopathy has progressed and is now moderate to severe (  individual nodes up to 27 mm short axis). This is nonspecific. Sequelae of chronic infection/inflammation seems more likely than a systemic lymphoproliferative disorder or Lymphoma considering lack of thoracic and no definite upper abdominal lymphadenopathy. - Chronic venous collaterals about the pelvis, stable since 2013. Electronically Signed   By: Odessa Fleming M.D.   On: 03/20/2016 13:56   Ct Abdomen Pelvis W Contrast Result Date: 03/20/2016 Limited diagnostic information, especially in the abdomen, related to large patient size. The following conclusions can be made: - No central pulmonary embolus, with lobar and distal pulmonary arteries not well evaluated. - Negative aorta. Mild cardiomegaly is new since 2013. No pericardial effusion. - Pulmonary atelectasis, but no other lung abnormality or or pleural effusion. - No renal obstruction. - Chronic inguinal lymphadenopathy appears stable since 2013 although right external iliac artery adenopathy has progressed and is now moderate to severe (individual nodes up to 27 mm short axis). This is nonspecific. Sequelae of chronic infection/inflammation seems more likely than a systemic lymphoproliferative disorder or Lymphoma considering lack of thoracic and no definite upper abdominal lymphadenopathy. - Chronic venous collaterals about the pelvis, stable since 2013.      Scheduled Meds: . carvedilol  3.125 mg Oral BID WC  . doxycycline  100 mg Oral BID AC  . furosemide  40 mg Intravenous Q8H  . heparin subcutaneous  5,000 Units Subcutaneous Q8H  . lisinopril  40 mg Oral Daily  . potassium chloride  40 mEq Oral TID   Continuous Infusions:   LOS: 5 days    Time spent: 15 minutes  Greater than 50% of the time spent on counseling and coordinating the care.   Manson Passey, MD [facility] Pager [phone]  If 7PM-7AM, please contact night-coverage [email or web address] Password [facility] 03/26/2016, 9:13 AM

## 2016-03-26 NOTE — Progress Notes (Signed)
Patient Name: [name] Date of Encounter: 03/26/2016  Primary Cardiologist: Dr. Kristeen Miss Problem List     Principal Problem:   Acute respiratory failure with hypoxia [facility]) Active Problems:   Venous stasis ulcer (HCC)   Morbid obesity (HCC)   Chronic acquired lymphedema   Type 2 diabetes mellitus (HCC)   SOB (shortness of breath)   Hypertensive urgency   Abdominal pain   OSA (obstructive sleep apnea)   Acute respiratory failure (HCC)   NSVT (nonsustained ventricular tachycardia) (HCC)   Hypokalemia    Subjective   Mild dyspnea; no chest pain  Inpatient Medications    . carvedilol  3.125 mg Oral BID WC  . doxycycline  100 mg Oral BID AC  . furosemide  40 mg Intravenous Q8H  . heparin subcutaneous  5,000 Units Subcutaneous Q8H  . lisinopril  40 mg Oral Daily  . potassium chloride  40 mEq Oral TID    Vital Signs    Vitals:   03/25/16 0906 03/25/16 1422 03/25/16 2137 03/26/16 0455  BP:  (!) 155/80 (!) 145/75 139/83  Pulse:  95 90 (!) 102  Resp:  20 20 18   Temp:  98.8 F (37.1 C) 98.3 F (36.8 C) 99.3 F (37.4 C)  TempSrc:  Oral Oral Oral  SpO2:  99% 98% 100%  Weight: (!) 563 lb 8 oz (255.6 kg)   (!) 560 lb 8 oz (254.2 kg)  Height:        Intake/Output Summary (Last 24 hours) at 03/26/16 1239 Last data filed at 03/26/16 0600  Gross per 24 hour  Intake              320 ml  Output             2600 ml  Net            -2280 ml   Filed Weights   03/24/16 0438 03/25/16 0906 03/26/16 0455  Weight: (!) 571 lb 14.4 oz (259.4 kg) (!) 563 lb 8 oz (255.6 kg) (!) 560 lb 8 oz (254.2 kg)    Physical Exam    General: Well developed, morbidly obese in no acute distress. HEENT: Normal Neck: Supple. Lungs: Diminished throughout Cardiac: RRR Abdomen: Difficult due to obesity Extremities: Diffuse edema Skin: Warm and dry. Neuro: Alert and oriented X 3. Sensation in tact. Follows commands. Psych:  Responds to questions appropriately with a normal  affect  Labs    Basic Metabolic Panel  Recent Labs  03/24/16 0517 03/25/16 0501 03/26/16 0601  NA 140 140 139  K 3.6 3.7 4.1  CL 101 100* 100*  CO2 31 31 30   GLUCOSE 113* 102* 113*  BUN 14 13 14   CREATININE 0.91 0.87 0.88  CALCIUM 8.9 8.8* 8.8*  MG 1.9  --   --     Telemetry    Sinus  Radiology    Dg Chest 2 View  Result Date: 03/20/2016 CLINICAL DATA:  Shortness of breath and abdominal swelling for 3 days. Chronic lymphedema. High blood pressure. EXAM: CHEST  2 VIEW COMPARISON:  12/26/2015 FINDINGS: Shallow inspiration. Borderline heart size without vascular congestion or edema. No focal consolidation or airspace disease. No blunting of costophrenic angles. No pneumothorax. Calcified granuloma in the right lung. IMPRESSION: Cardiac enlargement.  No evidence of active pulmonary disease. Electronically Signed   By: Burman Nieves M.D.   On: 03/20/2016 01:39   Ct Angio Chest Pe W Or Wo Contrast  Result Date: 03/20/2016  CLINICAL DATA:  36 year old male with super morbid obesity (631 pounds). Increasing shortness of breath. Increasing weight and abdominal girth recently. Hypertensive at presentation. Initial encounter. EXAM: CT ANGIOGRAPHY CHEST CT ABDOMEN AND PELVIS WITH CONTRAST TECHNIQUE: Multidetector CT imaging of the chest was performed using the standard protocol during bolus administration of intravenous contrast. Multiplanar CT image reconstructions and MIPs were obtained to evaluate the vascular anatomy. Multidetector CT imaging of the abdomen and pelvis was performed using the standard protocol during bolus administration of intravenous contrast. CONTRAST:  100 mL Isovue 370 COMPARISON:  CT Abdomen and Pelvis 11/05/2011, 05/09/2009 FINDINGS: CTA CHEST FINDINGS Cardiovascular: Suboptimal contrast bolus timing in the pulmonary arterial tree. Quantum mottle artifact. Mild respiratory motion artifact. No central pulmonary embolus. The lobar and distal pulmonary arteries are not  well evaluated. Mild cardiomegaly is new since 2013. No pericardial effusion. Negative thoracic aorta. Mediastinum/Nodes: No lymphadenopathy. Lungs/Pleura: Major airways are patent except for atelectatic changes. There is a calcified granuloma in the right upper lobe. Lower lung volumes compared to 2013 with crowding of lung markings but no abnormal pulmonary opacity or pleural effusion. Musculoskeletal: No acute osseous abnormality identified. Review of the MIP images confirms the above findings. CT ABDOMEN and PELVIS FINDINGS Hepatobiliary: The liver appears stable and within normal limits. Gallbladder detail is obscured, but the gallbladder is not abnormally dilated. Pancreas: The pancreatic tail appears within normal limits, the body and head of the pancreas are obscured by quantum mottle. Spleen: The spleen appears stable and within normal limits. Adrenals/Urinary Tract: No hydronephrosis. On delayed images there is bilateral renal excretion of IV contrast. The urinary bladder is distended but otherwise appears normal. Stomach/Bowel: Negative rectum. The splenic flexure and distal transverse colon appear within normal limits. Proximal small bowel loops and the hepatic flexure appear within normal limits on the delayed excretory images. Other small and large bowel loops are obscured by quantum mottle. Negative proximal stomach. Vascular/Lymphatic: Numerous chronic venous collaterals about both inguinal regions and the lower abdominal panniculus. Chronic right inguinal postoperative changes with numerous surgical clips, stable since 2013. Quantum mottle degrades detail of the abdominal aorta and iliac arteries, the abdominal aorta and proximal common iliac arteries appear within normal limits on the delayed excretory phase images. Chronically enlarged bilateral inguinal lymph nodes appear not significantly changed since 2013. There is evidence of right greater than left external iliac artery lymphadenopathy which  has increased since 2013 with individual nodes measuring up to 27 mm short axis (previously 18 mm). The mesenteric and retroperitoneal spaces in the abdomen and pelvis are poorly visualized due to quantum mottle. It is difficult to exclude retroperitoneal lymphadenopathy in the upper abdomen. Reproductive: Negative. Other: No pelvic free fluid. Musculoskeletal: Lumbar and pelvic bone detail degraded by body habitus/quantum mottle. No acute osseous abnormality identified. Review of the MIP images confirms the above findings. IMPRESSION: Limited diagnostic information, especially in the abdomen, related to large patient size. The following conclusions can be made: - No central pulmonary embolus, with lobar and distal pulmonary arteries not well evaluated. - Negative aorta. Mild cardiomegaly is new since 2013. No pericardial effusion. - Pulmonary atelectasis, but no other lung abnormality or or pleural effusion. - No renal obstruction. - Chronic inguinal lymphadenopathy appears stable since 2013 although right external iliac artery adenopathy has progressed and is now moderate to severe (individual nodes up to 27 mm short axis). This is nonspecific. Sequelae of chronic infection/inflammation seems more likely than a systemic lymphoproliferative disorder or Lymphoma considering lack of thoracic and  no definite upper abdominal lymphadenopathy. - Chronic venous collaterals about the pelvis, stable since 2013. Electronically Signed   By: Odessa Fleming M.D.   On: 03/20/2016 13:56   Ct Abdomen Pelvis W Contrast  Result Date: 03/20/2016 CLINICAL DATA:  36 year old male with super morbid obesity (631 pounds). Increasing shortness of breath. Increasing weight and abdominal girth recently. Hypertensive at presentation. Initial encounter. EXAM: CT ANGIOGRAPHY CHEST CT ABDOMEN AND PELVIS WITH CONTRAST TECHNIQUE: Multidetector CT imaging of the chest was performed using the standard protocol during bolus administration of intravenous  contrast. Multiplanar CT image reconstructions and MIPs were obtained to evaluate the vascular anatomy. Multidetector CT imaging of the abdomen and pelvis was performed using the standard protocol during bolus administration of intravenous contrast. CONTRAST:  100 mL Isovue 370 COMPARISON:  CT Abdomen and Pelvis 11/05/2011, 05/09/2009 FINDINGS: CTA CHEST FINDINGS Cardiovascular: Suboptimal contrast bolus timing in the pulmonary arterial tree. Quantum mottle artifact. Mild respiratory motion artifact. No central pulmonary embolus. The lobar and distal pulmonary arteries are not well evaluated. Mild cardiomegaly is new since 2013. No pericardial effusion. Negative thoracic aorta. Mediastinum/Nodes: No lymphadenopathy. Lungs/Pleura: Major airways are patent except for atelectatic changes. There is a calcified granuloma in the right upper lobe. Lower lung volumes compared to 2013 with crowding of lung markings but no abnormal pulmonary opacity or pleural effusion. Musculoskeletal: No acute osseous abnormality identified. Review of the MIP images confirms the above findings. CT ABDOMEN and PELVIS FINDINGS Hepatobiliary: The liver appears stable and within normal limits. Gallbladder detail is obscured, but the gallbladder is not abnormally dilated. Pancreas: The pancreatic tail appears within normal limits, the body and head of the pancreas are obscured by quantum mottle. Spleen: The spleen appears stable and within normal limits. Adrenals/Urinary Tract: No hydronephrosis. On delayed images there is bilateral renal excretion of IV contrast. The urinary bladder is distended but otherwise appears normal. Stomach/Bowel: Negative rectum. The splenic flexure and distal transverse colon appear within normal limits. Proximal small bowel loops and the hepatic flexure appear within normal limits on the delayed excretory images. Other small and large bowel loops are obscured by quantum mottle. Negative proximal stomach.  Vascular/Lymphatic: Numerous chronic venous collaterals about both inguinal regions and the lower abdominal panniculus. Chronic right inguinal postoperative changes with numerous surgical clips, stable since 2013. Quantum mottle degrades detail of the abdominal aorta and iliac arteries, the abdominal aorta and proximal common iliac arteries appear within normal limits on the delayed excretory phase images. Chronically enlarged bilateral inguinal lymph nodes appear not significantly changed since 2013. There is evidence of right greater than left external iliac artery lymphadenopathy which has increased since 2013 with individual nodes measuring up to 27 mm short axis (previously 18 mm). The mesenteric and retroperitoneal spaces in the abdomen and pelvis are poorly visualized due to quantum mottle. It is difficult to exclude retroperitoneal lymphadenopathy in the upper abdomen. Reproductive: Negative. Other: No pelvic free fluid. Musculoskeletal: Lumbar and pelvic bone detail degraded by body habitus/quantum mottle. No acute osseous abnormality identified. Review of the MIP images confirms the above findings. IMPRESSION: Limited diagnostic information, especially in the abdomen, related to large patient size. The following conclusions can be made: - No central pulmonary embolus, with lobar and distal pulmonary arteries not well evaluated. - Negative aorta. Mild cardiomegaly is new since 2013. No pericardial effusion. - Pulmonary atelectasis, but no other lung abnormality or or pleural effusion. - No renal obstruction. - Chronic inguinal lymphadenopathy appears stable since 2013 although right external iliac  artery adenopathy has progressed and is now moderate to severe (individual nodes up to 27 mm short axis). This is nonspecific. Sequelae of chronic infection/inflammation seems more likely than a systemic lymphoproliferative disorder or Lymphoma considering lack of thoracic and no definite upper abdominal  lymphadenopathy. - Chronic venous collaterals about the pelvis, stable since 2013. Electronically Signed   By: Odessa Fleming M.D.   On: 03/20/2016 13:56     Patient Profile    36 y.o.with chronic venous insufficiency s/p venous bypass, DM, morbid obesity, hypertension, sleep apnea, and lymphedema  [facility] with progressive swelling, SOB, and progressive weight gain (previously 520's in 12/2015). Volume overload felt due to compressive lymphedema vs. CHF. 2D echo uninterpretable, even with definity.   Assessment & Plan   1. Volume overload/elevated troponin - unclear if due to CHF or compressive lymphedema. 2D echo uninterpretable; patient would be too large for accurate assessment by other noninvasive testing (i.e. cMRI, coronary CTA, nuc). Remains volume overloaded; increase lasix to 120 mg IV BID and follow renal function. Add spironolactone 25 mg daily.  2. Essential HTN/hypertensive urgency- continue ACEI; increase coreg to 6.25 BID.  3. Hypokalemia - Improved  3. Morbid obesity - Life-threatening at this point. Strongly encourage continued f/u with bariatric surgery.   4. Anemia - per IM.  5. Abdominal wall erythema/cellulitis- per IM.  6. Sleep apnea - CPAP at night.   7. Venous insufficiency/lymphedema with ulceration - Per IM.    Signed, Olga Millers, MD  03/26/2016, 12:39 PM

## 2016-03-26 NOTE — Progress Notes (Signed)
PT Cancellation Note  Patient Details Name: [name] MRN: [id number] DOB: 1980/06/13   Cancelled Treatment:    Reason Eval/Treat Not Completed: Attempted PT eval. Waited for at least 10 minutes for pt to try to get up. Pt did not feel he could at that time. Will check back as schedule allows.    Rebeca AlertJannie Leya Paige, MPT Pager: [phone]

## 2016-03-27 LAB — BASIC METABOLIC PANEL
ANION GAP: 8 (ref 5–15)
BUN: 16 mg/dL (ref 6–20)
CHLORIDE: 100 mmol/L — AB (ref 101–111)
CO2: 30 mmol/L (ref 22–32)
Calcium: 8.9 mg/dL (ref 8.9–10.3)
Creatinine, Ser: 0.93 mg/dL (ref 0.61–1.24)
GFR calc Af Amer: >60 mL/min (ref >60)
GLUCOSE: 109 mg/dL — AB (ref 65–99)
POTASSIUM: 4 mmol/L (ref 3.5–5.1)
Sodium: 138 mmol/L (ref 135–145)

## 2016-03-27 NOTE — Progress Notes (Signed)
Patient Name: [name] Date of Encounter: 03/27/2016  Primary Cardiologist: Dr. Kristeen Miss Problem List     Principal Problem:   Acute respiratory failure with hypoxia [facility]) Active Problems:   Venous stasis ulcer (HCC)   Morbid obesity (HCC)   Chronic acquired lymphedema   Type 2 diabetes mellitus (HCC)   SOB (shortness of breath)   Hypertensive urgency   Abdominal pain   OSA (obstructive sleep apnea)   Acute respiratory failure (HCC)   NSVT (nonsustained ventricular tachycardia) (HCC)   Hypokalemia   Generalized edema    Subjective   Mild dyspnea; no chest pain  Inpatient Medications    . carvedilol  6.25 mg Oral BID WC  . doxycycline  100 mg Oral BID AC  . furosemide  120 mg Intravenous BID  . heparin subcutaneous  5,000 Units Subcutaneous Q8H  . lisinopril  40 mg Oral Daily  . potassium chloride  40 mEq Oral TID  . spironolactone  25 mg Oral Daily    Vital Signs    Vitals:   03/26/16 1540 03/26/16 1823 03/26/16 2204 03/27/16 0639  BP: (!) 118/52 (!) 115/94 (!) 150/87 (!) 162/98  Pulse: 89  86 85  Resp: 19  18 18   Temp: 99.1 F (37.3 C)  98.9 F (37.2 C) 97.6 F (36.4 C)  TempSrc: Oral  Oral Oral  SpO2: 94%  100% 100%  Weight:    (!) 556 lb 6.4 oz (252.4 kg)  Height:        Intake/Output Summary (Last 24 hours) at 03/27/16 1405 Last data filed at 03/27/16 1300  Gross per 24 hour  Intake             1202 ml  Output             4175 ml  Net            -2973 ml   Filed Weights   03/25/16 0906 03/26/16 0455 03/27/16 0639  Weight: (!) 563 lb 8 oz (255.6 kg) (!) 560 lb 8 oz (254.2 kg) (!) 556 lb 6.4 oz (252.4 kg)    Physical Exam    General: Well developed, morbidly obese in no acute distress. HEENT: Normal Neck: Supple. Lungs: Diminished throughout Cardiac: RRR Abdomen: Difficult due to obesity Extremities: Diffuse edema Skin: Warm and dry. Neuro: Alert and oriented X 3. Sensation in tact. Follows commands. Psych:  Responds  to questions appropriately with a normal affect  Labs    Basic Metabolic Panel  Recent Labs  03/26/16 0601 03/27/16 0615  NA 139 138  K 4.1 4.0  CL 100* 100*  CO2 30 30  GLUCOSE 113* 109*  BUN 14 16  CREATININE 0.88 0.93  CALCIUM 8.8* 8.9    Telemetry    Sinus  Radiology    Dg Chest 2 View  Result Date: 03/20/2016 CLINICAL DATA:  Shortness of breath and abdominal swelling for 3 days. Chronic lymphedema. High blood pressure. EXAM: CHEST  2 VIEW COMPARISON:  12/26/2015 FINDINGS: Shallow inspiration. Borderline heart size without vascular congestion or edema. No focal consolidation or airspace disease. No blunting of costophrenic angles. No pneumothorax. Calcified granuloma in the right lung. IMPRESSION: Cardiac enlargement.  No evidence of active pulmonary disease. Electronically Signed   By: Burman Nieves M.D.   On: 03/20/2016 01:39   Ct Angio Chest Pe W Or Wo Contrast  Result Date: 03/20/2016 CLINICAL DATA:  36 year old male with super morbid obesity (631 pounds). Increasing shortness  of breath. Increasing weight and abdominal girth recently. Hypertensive at presentation. Initial encounter. EXAM: CT ANGIOGRAPHY CHEST CT ABDOMEN AND PELVIS WITH CONTRAST TECHNIQUE: Multidetector CT imaging of the chest was performed using the standard protocol during bolus administration of intravenous contrast. Multiplanar CT image reconstructions and MIPs were obtained to evaluate the vascular anatomy. Multidetector CT imaging of the abdomen and pelvis was performed using the standard protocol during bolus administration of intravenous contrast. CONTRAST:  100 mL Isovue 370 COMPARISON:  CT Abdomen and Pelvis 11/05/2011, 05/09/2009 FINDINGS: CTA CHEST FINDINGS Cardiovascular: Suboptimal contrast bolus timing in the pulmonary arterial tree. Quantum mottle artifact. Mild respiratory motion artifact. No central pulmonary embolus. The lobar and distal pulmonary arteries are not well evaluated. Mild  cardiomegaly is new since 2013. No pericardial effusion. Negative thoracic aorta. Mediastinum/Nodes: No lymphadenopathy. Lungs/Pleura: Major airways are patent except for atelectatic changes. There is a calcified granuloma in the right upper lobe. Lower lung volumes compared to 2013 with crowding of lung markings but no abnormal pulmonary opacity or pleural effusion. Musculoskeletal: No acute osseous abnormality identified. Review of the MIP images confirms the above findings. CT ABDOMEN and PELVIS FINDINGS Hepatobiliary: The liver appears stable and within normal limits. Gallbladder detail is obscured, but the gallbladder is not abnormally dilated. Pancreas: The pancreatic tail appears within normal limits, the body and head of the pancreas are obscured by quantum mottle. Spleen: The spleen appears stable and within normal limits. Adrenals/Urinary Tract: No hydronephrosis. On delayed images there is bilateral renal excretion of IV contrast. The urinary bladder is distended but otherwise appears normal. Stomach/Bowel: Negative rectum. The splenic flexure and distal transverse colon appear within normal limits. Proximal small bowel loops and the hepatic flexure appear within normal limits on the delayed excretory images. Other small and large bowel loops are obscured by quantum mottle. Negative proximal stomach. Vascular/Lymphatic: Numerous chronic venous collaterals about both inguinal regions and the lower abdominal panniculus. Chronic right inguinal postoperative changes with numerous surgical clips, stable since 2013. Quantum mottle degrades detail of the abdominal aorta and iliac arteries, the abdominal aorta and proximal common iliac arteries appear within normal limits on the delayed excretory phase images. Chronically enlarged bilateral inguinal lymph nodes appear not significantly changed since 2013. There is evidence of right greater than left external iliac artery lymphadenopathy which has increased since  2013 with individual nodes measuring up to 27 mm short axis (previously 18 mm). The mesenteric and retroperitoneal spaces in the abdomen and pelvis are poorly visualized due to quantum mottle. It is difficult to exclude retroperitoneal lymphadenopathy in the upper abdomen. Reproductive: Negative. Other: No pelvic free fluid. Musculoskeletal: Lumbar and pelvic bone detail degraded by body habitus/quantum mottle. No acute osseous abnormality identified. Review of the MIP images confirms the above findings. IMPRESSION: Limited diagnostic information, especially in the abdomen, related to large patient size. The following conclusions can be made: - No central pulmonary embolus, with lobar and distal pulmonary arteries not well evaluated. - Negative aorta. Mild cardiomegaly is new since 2013. No pericardial effusion. - Pulmonary atelectasis, but no other lung abnormality or or pleural effusion. - No renal obstruction. - Chronic inguinal lymphadenopathy appears stable since 2013 although right external iliac artery adenopathy has progressed and is now moderate to severe (individual nodes up to 27 mm short axis). This is nonspecific. Sequelae of chronic infection/inflammation seems more likely than a systemic lymphoproliferative disorder or Lymphoma considering lack of thoracic and no definite upper abdominal lymphadenopathy. - Chronic venous collaterals about the pelvis, stable  since 2013. Electronically Signed   By: Odessa Fleming M.D.   On: 03/20/2016 13:56   Ct Abdomen Pelvis W Contrast  Result Date: 03/20/2016 CLINICAL DATA:  36 year old male with super morbid obesity (631 pounds). Increasing shortness of breath. Increasing weight and abdominal girth recently. Hypertensive at presentation. Initial encounter. EXAM: CT ANGIOGRAPHY CHEST CT ABDOMEN AND PELVIS WITH CONTRAST TECHNIQUE: Multidetector CT imaging of the chest was performed using the standard protocol during bolus administration of intravenous contrast.  Multiplanar CT image reconstructions and MIPs were obtained to evaluate the vascular anatomy. Multidetector CT imaging of the abdomen and pelvis was performed using the standard protocol during bolus administration of intravenous contrast. CONTRAST:  100 mL Isovue 370 COMPARISON:  CT Abdomen and Pelvis 11/05/2011, 05/09/2009 FINDINGS: CTA CHEST FINDINGS Cardiovascular: Suboptimal contrast bolus timing in the pulmonary arterial tree. Quantum mottle artifact. Mild respiratory motion artifact. No central pulmonary embolus. The lobar and distal pulmonary arteries are not well evaluated. Mild cardiomegaly is new since 2013. No pericardial effusion. Negative thoracic aorta. Mediastinum/Nodes: No lymphadenopathy. Lungs/Pleura: Major airways are patent except for atelectatic changes. There is a calcified granuloma in the right upper lobe. Lower lung volumes compared to 2013 with crowding of lung markings but no abnormal pulmonary opacity or pleural effusion. Musculoskeletal: No acute osseous abnormality identified. Review of the MIP images confirms the above findings. CT ABDOMEN and PELVIS FINDINGS Hepatobiliary: The liver appears stable and within normal limits. Gallbladder detail is obscured, but the gallbladder is not abnormally dilated. Pancreas: The pancreatic tail appears within normal limits, the body and head of the pancreas are obscured by quantum mottle. Spleen: The spleen appears stable and within normal limits. Adrenals/Urinary Tract: No hydronephrosis. On delayed images there is bilateral renal excretion of IV contrast. The urinary bladder is distended but otherwise appears normal. Stomach/Bowel: Negative rectum. The splenic flexure and distal transverse colon appear within normal limits. Proximal small bowel loops and the hepatic flexure appear within normal limits on the delayed excretory images. Other small and large bowel loops are obscured by quantum mottle. Negative proximal stomach. Vascular/Lymphatic:  Numerous chronic venous collaterals about both inguinal regions and the lower abdominal panniculus. Chronic right inguinal postoperative changes with numerous surgical clips, stable since 2013. Quantum mottle degrades detail of the abdominal aorta and iliac arteries, the abdominal aorta and proximal common iliac arteries appear within normal limits on the delayed excretory phase images. Chronically enlarged bilateral inguinal lymph nodes appear not significantly changed since 2013. There is evidence of right greater than left external iliac artery lymphadenopathy which has increased since 2013 with individual nodes measuring up to 27 mm short axis (previously 18 mm). The mesenteric and retroperitoneal spaces in the abdomen and pelvis are poorly visualized due to quantum mottle. It is difficult to exclude retroperitoneal lymphadenopathy in the upper abdomen. Reproductive: Negative. Other: No pelvic free fluid. Musculoskeletal: Lumbar and pelvic bone detail degraded by body habitus/quantum mottle. No acute osseous abnormality identified. Review of the MIP images confirms the above findings. IMPRESSION: Limited diagnostic information, especially in the abdomen, related to large patient size. The following conclusions can be made: - No central pulmonary embolus, with lobar and distal pulmonary arteries not well evaluated. - Negative aorta. Mild cardiomegaly is new since 2013. No pericardial effusion. - Pulmonary atelectasis, but no other lung abnormality or or pleural effusion. - No renal obstruction. - Chronic inguinal lymphadenopathy appears stable since 2013 although right external iliac artery adenopathy has progressed and is now moderate to severe (individual nodes up  to 27 mm short axis). This is nonspecific. Sequelae of chronic infection/inflammation seems more likely than a systemic lymphoproliferative disorder or Lymphoma considering lack of thoracic and no definite upper abdominal lymphadenopathy. - Chronic  venous collaterals about the pelvis, stable since 2013. Electronically Signed   By: Odessa Fleming M.D.   On: 03/20/2016 13:56     Patient Profile    36 y.o.with chronic venous insufficiency s/p venous bypass, DM, morbid obesity, hypertension, sleep apnea, and lymphedema  admitted to the [facility] with progressive swelling, SOB, and progressive weight gain (previously 520's in 12/2015). Volume overload felt due to compressive lymphedema vs. CHF. 2D echo uninterpretable, even with definity.   Assessment & Plan   1. Volume overload/elevated troponin - unclear if due to CHF or compressive lymphedema. 2D echo uninterpretable; patient would be too large for accurate assessment by other noninvasive testing (i.e. cMRI, coronary CTA, nuc). Remains volume overloaded; increase lasix to 120 mg IV BID and follow renal function. Add spironolactone 25 mg daily.  2. Essential HTN/hypertensive urgency- continue ACEI; increase coreg to 6.25 BID.  3. Hypokalemia - Improved  3. Morbid obesity - Life-threatening at this point. Strongly encourage continued f/u with bariatric surgery.   4. Anemia - per IM.  5. Abdominal wall erythema/cellulitis- per IM.  6. Sleep apnea - CPAP at night.   7. Venous insufficiency/lymphedema with ulceration - Per IM.   Plan: Pt continues to diurese. Renal function stable. Aldactone added.   Signed, Corine Shelter, PA-C  03/27/2016, 2:05 PM   As above, patient seen and examined. Denies dyspnea or chest pain.He continues to diurese. Continue present dose of Lasix and follow renal function.   Olga Millers, MD

## 2016-03-27 NOTE — Progress Notes (Signed)
PROGRESS NOTE    [name]  ZOX:[id number] DOB: 07/22/1980 DOA: 03/20/2016 PCP: Lupe Carney, MD     Brief Narrative:  [name] is a 36 y.o.malewith past medical history of morbi obesity, hypertension, sleep apnea, chronic venous stasis on LE. He presented to [facility] with worsening shortness of breath at rest and with exertion over the past few days PTA. Patient stated he may have gained at least 70 pounds in the last 1 month prior to this presentation.   Assessment & Plan:   Principal Problem:   Acute respiratory failure with hypoxia (HCC) Active Problems:   Venous stasis ulcer (HCC)   Morbid obesity (HCC)   Chronic acquired lymphedema   Type 2 diabetes mellitus (HCC)   SOB (shortness of breath)   Hypertensive urgency   Abdominal pain   OSA (obstructive sleep apnea)   Acute respiratory failure (HCC)   NSVT (nonsustained ventricular tachycardia) (HCC)   Hypokalemia   Generalized edema   Volume overload - Unclear if due to CHF or compressive lymphedema  - Unable to adequately get echo results due to pt body habitus. Unable to do any further noninvasive testing due to body habitus. - Weight 1/13 was 579 lbs; weight this am is 556 lbs. In October 2017, patient was 527lbs.  - I/O -17L this admission so far  - Continue IV lasix, spironolactone. - Appreciate cardiology   Essential hypertension - Continue lisinopril, coreg 3.125 mg BID  Venous insufficiency/lymphedema with ulceration of greater than 1 year duration, POA - Evaluated by wound RN.  - Measurement: 9cm x 20cm area of involvement with 0.8cm depth at the deepest point. Dressing procedure/placement/frequency: twice weekly (Tuesday/Friday) change schedule using a silver hydrofiber dressing as a wound contact layer and suggest home care be considered as a more viable option for routine dressing changes. - HHRN for wound care teaching at discharge - Doxycycline last dose today    Morbid obesity due to excess  calories  - Body mass index is 74.38 kg/m - Seen by nutritionist - Counseled on diet and nutrition, encourage weight loss, increase in activity and exercise   Sleep apnea - CPAP at night     DVT prophylaxis: heparin subq Code Status: Full Family Communication: no family at bedside Disposition Plan: pending further improvement in respiratory status    Consultants:   Cardiology  Procedures:   Echo  Antimicrobials:   Doxycycline 1/10 - 1/17     Subjective: He admits to bilateral abdominal pain under his skin due to harden skin and lymphedema. He states that his breathing has improved, currently without any shortness of breath or chest pain complaints. He does have lower extremity wounds that he has had for a [place] time and cares for at home with his mom.    Objective: Vitals:   03/26/16 1540 03/26/16 1823 03/26/16 2204 03/27/16 0639  BP: (!) 118/52 (!) 115/94 (!) 150/87 (!) 162/98  Pulse: 89  86 85  Resp: 19  18 18   Temp: 99.1 F (37.3 C)  98.9 F (37.2 C) 97.6 F (36.4 C)  TempSrc: Oral  Oral Oral  SpO2: 94%  100% 100%  Weight:    (!) 252.4 kg (556 lb 6.4 oz)  Height:        Intake/Output Summary (Last 24 hours) at 03/27/16 1328 Last data filed at 03/27/16 1300  Gross per 24 hour  Intake             1202 ml  Output  4175 ml  Net            -2973 ml   Filed Weights   03/25/16 0906 03/26/16 0455 03/27/16 0639  Weight: (!) 255.6 kg (563 lb 8 oz) (!) 254.2 kg (560 lb 8 oz) (!) 252.4 kg (556 lb 6.4 oz)    Examination:  General exam: Appears calm and comfortable  Respiratory system: Clear to auscultation. Respiratory effort normal. Cardiovascular system: S1 & S2 heard, RRR. No JVD, murmurs, rubs, gallops or clicks. +diffuse edema in dependent regions  Gastrointestinal system: Abdomen is nondistended, soft and nontender. No organomegaly or masses felt. Normal bowel sounds heard. Morbidly obese  Central nervous system: Alert and oriented. No focal  neurological deficits. Extremities: Symmetric 5 x 5 power. Skin: +UNNA boot  Psychiatry: Judgement and insight appear normal. Mood & affect appropriate.   Data Reviewed: I have personally reviewed following labs and imaging studies  CBC:  Recent Labs Lab 03/22/16 0510  WBC 8.1  HGB 10.7*  HCT 33.5*  MCV 75.8*  PLT 239   Basic Metabolic Panel:  Recent Labs Lab 03/21/16 1853  03/23/16 0553 03/24/16 0517 03/25/16 0501 03/26/16 0601 03/27/16 0615  NA 138  < > 136 140 140 139 138  K 3.8  < > 3.8 3.6 3.7 4.1 4.0  CL 99*  < > 98* 101 100* 100* 100*  CO2 31  < > 30 31 31 30 30   GLUCOSE 106*  < > 105* 113* 102* 113* 109*  BUN 12  < > 13 14 13 14 16   CREATININE 0.92  < > 0.83 0.91 0.87 0.88 0.93  CALCIUM 9.2  < > 8.6* 8.9 8.8* 8.8* 8.9  MG 1.9  --   --  1.9  --   --   --   < > = values in this interval not displayed. GFR: Estimated Creatinine Clearance: 235.7 mL/min (by C-G formula based on SCr of 0.93 mg/dL). Liver Function Tests: No results for input(s): AST, ALT, ALKPHOS, BILITOT, PROT, ALBUMIN in the last 168 hours. No results for input(s): LIPASE, AMYLASE in the last 168 hours. No results for input(s): AMMONIA in the last 168 hours. Coagulation Profile: No results for input(s): INR, PROTIME in the last 168 hours. Cardiac Enzymes:  Recent Labs Lab 03/20/16 1723  TROPONINI 0.08*   BNP (last 3 results) No results for input(s): PROBNP in the last 8760 hours. HbA1C: No results for input(s): HGBA1C in the last 72 hours. CBG: No results for input(s): GLUCAP in the last 168 hours. Lipid Profile: No results for input(s): CHOL, HDL, LDLCALC, TRIG, CHOLHDL, LDLDIRECT in the last 72 hours. Thyroid Function Tests: No results for input(s): TSH, T4TOTAL, FREET4, T3FREE, THYROIDAB in the last 72 hours. Anemia Panel: No results for input(s): VITAMINB12, FOLATE, FERRITIN, TIBC, IRON, RETICCTPCT in the last 72 hours. Sepsis Labs: No results for input(s): PROCALCITON,  LATICACIDVEN in the last 168 hours.  Recent Results (from the past 240 hour(s))  MRSA PCR Screening     Status: None   Collection Time: 03/20/16  6:40 PM  Result Value Ref Range Status   MRSA by PCR NEGATIVE NEGATIVE Final    Comment:        The GeneXpert MRSA Assay (FDA approved for NASAL specimens only), is one component of a comprehensive MRSA colonization surveillance program. It is not intended to diagnose MRSA infection nor to guide or monitor treatment for MRSA infections.        Radiology Studies: No results found.  Scheduled Meds: . carvedilol  6.25 mg Oral BID WC  . doxycycline  100 mg Oral BID AC  . furosemide  120 mg Intravenous BID  . heparin subcutaneous  5,000 Units Subcutaneous Q8H  . lisinopril  40 mg Oral Daily  . potassium chloride  40 mEq Oral TID  . spironolactone  25 mg Oral Daily   Continuous Infusions:   LOS: 6 days    Time spent: 40 minutes   Noralee Stain, DO [facility] [email or web address] Password [email or web address] 03/27/2016, 1:28 PM

## 2016-03-27 NOTE — Care Management Note (Signed)
Case Management Note  Patient Details  Name: [name] MRN: [id number] Date of Birth: [birth year]  Subjective/Objective:  Recc HHRN-wound care, HHRN,[id number] orders in. [facility] aware if orders, & following.                  Action/Plan:d/c plan home w/HHC.   Expected Discharge Date:   (unknown)               Expected Discharge Plan:  Home w Home Health Services  In-House Referral:     Discharge planning Services  CM Consult  Post Acute Care Choice:    Choice offered to:  Patient  DME Arranged:    DME Agency:     HH Arranged:  RN HH Agency:  [facility]  Status of Service:  In process, will continue to follow  If discussed at [place] Length of Stay Meetings, dates discussed:    Additional Comments:  Lanier ClamMahabir, Curtina Grills, RN 03/27/2016, 12:14 PM

## 2016-03-28 LAB — BASIC METABOLIC PANEL
Anion gap: 8 (ref 5–15)
BUN: 17 mg/dL (ref 6–20)
CALCIUM: 8.9 mg/dL (ref 8.9–10.3)
CO2: 33 mmol/L — ABNORMAL HIGH (ref 22–32)
CREATININE: 0.8 mg/dL (ref 0.61–1.24)
Chloride: 99 mmol/L — ABNORMAL LOW (ref 101–111)
GFR calc Af Amer: >60 mL/min (ref >60)
GLUCOSE: 102 mg/dL — AB (ref 65–99)
Potassium: 3.9 mmol/L (ref 3.5–5.1)
SODIUM: 140 mmol/L (ref 135–145)

## 2016-03-28 MED ORDER — FUROSEMIDE 10 MG/ML IJ SOLN
80.0000 mg | Freq: Two times a day (BID) | INTRAMUSCULAR | Status: DC
  Administered 2016-03-28 – 2016-04-03 (×12): 80 mg via INTRAVENOUS
  Filled 2016-03-28 (×12): qty 8

## 2016-03-28 NOTE — Progress Notes (Signed)
Physical Therapy Treatment Patient Details Name: [name] MRN: [id number] DOB: Oct 02, 1980 Today's Date: 03/28/2016    History of Present Illness 36 yo male admitted with acute respiratory failure. Hx of morbid obesity, HTN, chronic lymphedema, chronic wounds    PT Comments    Progressing with mobility.   Follow Up Recommendations  No PT follow up     Equipment Recommendations  None recommended by PT    Recommendations for Other Services       Precautions / Restrictions Precautions Precautions: Fall Restrictions Weight Bearing Restrictions: No    Mobility  Bed Mobility               General bed mobility comments: oob in recliner  Transfers Overall transfer level: Needs assistance Equipment used: Rolling walker (2 wheeled);None Transfers: Sit to/from Stand Sit to Stand: Supervision         General transfer comment: Pt uses momentum.   Ambulation/Gait Ambulation/Gait assistance: Min guard Ambulation Distance (Feet): 250 Feet Assistive device: Rolling walker (2 wheeled) Gait Pattern/deviations: Step-through pattern;Wide base of support     General Gait Details: close guard for safety.    Stairs            Wheelchair Mobility    Modified Rankin (Stroke Patients Only)       Balance                                    Cognition Arousal/Alertness: Awake/alert Behavior During Therapy: WFL for tasks assessed/performed Overall Cognitive Status: Within Functional Limits for tasks assessed                      Exercises      General Comments        Pertinent Vitals/Pain Pain Assessment: No/denies pain    Home Living                      Prior Function            PT Goals (current goals can now be found in the care plan section) Progress towards PT goals: Progressing toward goals    Frequency    Min 2X/week      PT Plan Current plan remains appropriate    Co-evaluation              End of Session   Activity Tolerance: Patient tolerated treatment well Patient left: in chair;with call bell/phone within reach;with family/visitor present     Time: [id number] PT Time Calculation (min) (ACUTE ONLY): 17 min  Charges:  $Gait Training: 8-22 mins                    G Codes:      Rebeca AlertJannie Avila Albritton, MPT Pager: [phone]

## 2016-03-28 NOTE — Progress Notes (Signed)
Patient places home CPAP on himself, RT will continue to monitor as needed.

## 2016-03-28 NOTE — Progress Notes (Signed)
PROGRESS NOTE    Shawn Majordrian T Kyne  UJW:[id number] DOB: 1980/04/19 DOA: 03/20/2016 PCP: Lupe Carneyean Mitchell, MD     Brief Narrative:  [name] is a 36 y.o.malewith past medical history of morbi obesity, hypertension, sleep apnea, chronic venous stasis on LE. [facility] with worsening shortness of breath at rest and with exertion over the past few days PTA. Patient stated he may have gained at least 70 pounds in the last 1 month prior to this presentation.   Assessment & Plan:   Principal Problem:   Acute respiratory failure with hypoxia (HCC) Active Problems:   Venous stasis ulcer (HCC)   Morbid obesity (HCC)   Chronic acquired lymphedema   Type 2 diabetes mellitus (HCC)   SOB (shortness of breath)   Hypertensive urgency   Abdominal pain   OSA (obstructive sleep apnea)   Acute respiratory failure (HCC)   NSVT (nonsustained ventricular tachycardia) (HCC)   Hypokalemia   Generalized edema   Volume overload - Unclear if due to CHF or compressive lymphedema  - Unable to adequately get echo results due to pt body habitus. Unable to do any further noninvasive testing due to body habitus. - Weight 1/13 was 579 lbs; weight this am is 556 lbs. In October 2017, patient was 527lbs.  - I/O -24L this admission so far  - Continue IV lasix, spironolactone. - Appreciate cardiology   Essential hypertension - Continue lisinopril, coreg 3.125 mg BID  Venous insufficiency/lymphedema with ulceration of greater than 1 year duration, POA - Evaluated by wound RN.  - Measurement: 9cm x 20cm area of involvement with 0.8cm depth at the deepest point. Dressing procedure/placement/frequency: twice weekly (Tuesday/Friday) change schedule using a silver hydrofiber dressing as a wound contact layer and suggest home care be considered as a more viable option for routine dressing changes. - HHRN for wound care teaching at discharge - Finished doxycycline  Morbid obesity due to excess calories    - Body mass index is 74.38 kg/m - Seen by nutritionist - Counseled on diet and nutrition, encourage weight loss, increase in activity and exercise   Sleep apnea - CPAP at night     DVT prophylaxis: heparin subq Code Status: Full Family Communication: no family at bedside Disposition Plan: pending further improvement in respiratory status    Consultants:   Cardiology  Procedures:   Echo  Antimicrobials:   Doxycycline 1/10 - 1/17     Subjective: No new complaints. Continues to diurese. Breathing is much improved.    Objective: Vitals:   03/27/16 0639 03/27/16 1412 03/27/16 2057 03/28/16 0347  BP: (!) 162/98 (!) 112/96 (!) 160/98 (!) 149/82  Pulse: 85 85 85 92  Resp: 18 19 19 19   Temp: 97.6 F (36.4 C) 97.3 F (36.3 C) 98.8 F (37.1 C) 98.1 F (36.7 C)  TempSrc: Oral Oral Oral Oral  SpO2: 100% 100% 99% 100%  Weight: (!) 252.4 kg (556 lb 6.4 oz)     Height:        Intake/Output Summary (Last 24 hours) at 03/28/16 1242 Last data filed at 03/28/16 1212  Gross per 24 hour  Intake              746 ml  Output            10975 ml  Net           -10229 ml   Filed Weights   03/25/16 0906 03/26/16 0455 03/27/16 0639  Weight: (!) 255.6 kg (563  lb 8 oz) (!) 254.2 kg (560 lb 8 oz) (!) 252.4 kg (556 lb 6.4 oz)    Examination:  General exam: Appears calm and comfortable  Respiratory system: Clear to auscultation. Respiratory effort normal. Cardiovascular system: S1 & S2 heard, RRR. No JVD, murmurs, rubs, gallops or clicks. +diffuse edema in dependent regions  Gastrointestinal system: Abdomen is nondistended, soft and nontender. No organomegaly or masses felt. Normal bowel sounds heard. Morbidly obese  Central nervous system: Alert and oriented. No focal neurological deficits. Extremities: Symmetric 5 x 5 power. Skin: +UNNA boot  Psychiatry: Judgement and insight appear normal. Mood & affect appropriate.   Data Reviewed: I have personally reviewed following  labs and imaging studies  CBC:  Recent Labs Lab 03/22/16 0510  WBC 8.1  HGB 10.7*  HCT 33.5*  MCV 75.8*  PLT 239   Basic Metabolic Panel:  Recent Labs Lab 03/21/16 1853  03/24/16 0517 03/25/16 0501 03/26/16 0601 03/27/16 0615 03/28/16 0614  NA 138  < > 140 140 139 138 140  K 3.8  < > 3.6 3.7 4.1 4.0 3.9  CL 99*  < > 101 100* 100* 100* 99*  CO2 31  < > 31 31 30 30  33*  GLUCOSE 106*  < > 113* 102* 113* 109* 102*  BUN 12  < > 14 13 14 16 17   CREATININE 0.92  < > 0.91 0.87 0.88 0.93 0.80  CALCIUM 9.2  < > 8.9 8.8* 8.8* 8.9 8.9  MG 1.9  --  1.9  --   --   --   --   < > = values in this interval not displayed. GFR: Estimated Creatinine Clearance: 274 mL/min (by C-G formula based on SCr of 0.8 mg/dL). Liver Function Tests: No results for input(s): AST, ALT, ALKPHOS, BILITOT, PROT, ALBUMIN in the last 168 hours. No results for input(s): LIPASE, AMYLASE in the last 168 hours. No results for input(s): AMMONIA in the last 168 hours. Coagulation Profile: No results for input(s): INR, PROTIME in the last 168 hours. Cardiac Enzymes: No results for input(s): CKTOTAL, CKMB, CKMBINDEX, TROPONINI in the last 168 hours. BNP (last 3 results) No results for input(s): PROBNP in the last 8760 hours. HbA1C: No results for input(s): HGBA1C in the last 72 hours. CBG: No results for input(s): GLUCAP in the last 168 hours. Lipid Profile: No results for input(s): CHOL, HDL, LDLCALC, TRIG, CHOLHDL, LDLDIRECT in the last 72 hours. Thyroid Function Tests: No results for input(s): TSH, T4TOTAL, FREET4, T3FREE, THYROIDAB in the last 72 hours. Anemia Panel: No results for input(s): VITAMINB12, FOLATE, FERRITIN, TIBC, IRON, RETICCTPCT in the last 72 hours. Sepsis Labs: No results for input(s): PROCALCITON, LATICACIDVEN in the last 168 hours.  Recent Results (from the past 240 hour(s))  MRSA PCR Screening     Status: None   Collection Time: 03/20/16  6:40 PM  Result Value Ref Range Status    MRSA by PCR NEGATIVE NEGATIVE Final    Comment:        The GeneXpert MRSA Assay (FDA approved for NASAL specimens only), is one component of a comprehensive MRSA colonization surveillance program. It is not intended to diagnose MRSA infection nor to guide or monitor treatment for MRSA infections.        Radiology Studies: No results found.    Scheduled Meds: . carvedilol  6.25 mg Oral BID WC  . furosemide  80 mg Intravenous BID  . heparin subcutaneous  5,000 Units Subcutaneous Q8H  . lisinopril  40 mg Oral Daily  . potassium chloride  40 mEq Oral TID  . spironolactone  25 mg Oral Daily   Continuous Infusions:   LOS: 7 days    Time spent: 40 minutes   Noralee Stain, DO [facility] [email or web address] Password [facility] 03/28/2016, 12:42 PM

## 2016-03-28 NOTE — Progress Notes (Signed)
Patient Name: [name] Date of Encounter: 03/28/2016  Primary Cardiologist: Dr. Kristeen Miss Problem List     Principal Problem:   Acute respiratory failure with hypoxia [facility]) Active Problems:   Venous stasis ulcer (HCC)   Morbid obesity (HCC)   Chronic acquired lymphedema   Type 2 diabetes mellitus (HCC)   SOB (shortness of breath)   Hypertensive urgency   Abdominal pain   OSA (obstructive sleep apnea)   Acute respiratory failure (HCC)   NSVT (nonsustained ventricular tachycardia) (HCC)   Hypokalemia   Generalized edema    Subjective   Denies dyspnea or chest pain  Inpatient Medications    . carvedilol  6.25 mg Oral BID WC  . furosemide  120 mg Intravenous BID  . heparin subcutaneous  5,000 Units Subcutaneous Q8H  . lisinopril  40 mg Oral Daily  . potassium chloride  40 mEq Oral TID  . spironolactone  25 mg Oral Daily    Vital Signs    Vitals:   03/27/16 0639 03/27/16 1412 03/27/16 2057 03/28/16 0347  BP: (!) 162/98 (!) 112/96 (!) 160/98 (!) 149/82  Pulse: 85 85 85 92  Resp: 18 19 19 19   Temp: 97.6 F (36.4 C) 97.3 F (36.3 C) 98.8 F (37.1 C) 98.1 F (36.7 C)  TempSrc: Oral Oral Oral Oral  SpO2: 100% 100% 99% 100%  Weight: (!) 556 lb 6.4 oz (252.4 kg)     Height:        Intake/Output Summary (Last 24 hours) at 03/28/16 1047 Last data filed at 03/28/16 1000  Gross per 24 hour  Intake              746 ml  Output             9700 ml  Net            -8954 ml   Filed Weights   03/25/16 0906 03/26/16 0455 03/27/16 0639  Weight: (!) 563 lb 8 oz (255.6 kg) (!) 560 lb 8 oz (254.2 kg) (!) 556 lb 6.4 oz (252.4 kg)    Physical Exam    General: Well developed, morbidly obese in no acute distress. HEENT: Normal Neck: Supple. Lungs: Diminished throughout Cardiac: RRR Abdomen: Difficult due to obesity; still with abd wall edema Extremities: 1+ edema Skin: Warm and dry. Neuro: Alert and oriented X 3. Sensation in tact. Follows  commands. Psych:  Responds to questions appropriately with a normal affect  Labs    Basic Metabolic Panel  Recent Labs  03/27/16 0615 03/28/16 0614  NA 138 140  K 4.0 3.9  CL 100* 99*  CO2 30 33*  GLUCOSE 109* 102*  BUN 16 17  CREATININE 0.93 0.80  CALCIUM 8.9 8.9    Telemetry    Sinus  Radiology    Dg Chest 2 View  Result Date: 03/20/2016 CLINICAL DATA:  Shortness of breath and abdominal swelling for 3 days. Chronic lymphedema. High blood pressure. EXAM: CHEST  2 VIEW COMPARISON:  12/26/2015 FINDINGS: Shallow inspiration. Borderline heart size without vascular congestion or edema. No focal consolidation or airspace disease. No blunting of costophrenic angles. No pneumothorax. Calcified granuloma in the right lung. IMPRESSION: Cardiac enlargement.  No evidence of active pulmonary disease. Electronically Signed   By: Burman Nieves M.D.   On: 03/20/2016 01:39   Ct Angio Chest Pe W Or Wo Contrast  Result Date: 03/20/2016 CLINICAL DATA:  36 year old male with super morbid obesity (631 pounds). Increasing shortness  of breath. Increasing weight and abdominal girth recently. Hypertensive at presentation. Initial encounter. EXAM: CT ANGIOGRAPHY CHEST CT ABDOMEN AND PELVIS WITH CONTRAST TECHNIQUE: Multidetector CT imaging of the chest was performed using the standard protocol during bolus administration of intravenous contrast. Multiplanar CT image reconstructions and MIPs were obtained to evaluate the vascular anatomy. Multidetector CT imaging of the abdomen and pelvis was performed using the standard protocol during bolus administration of intravenous contrast. CONTRAST:  100 mL Isovue 370 COMPARISON:  CT Abdomen and Pelvis 11/05/2011, 05/09/2009 FINDINGS: CTA CHEST FINDINGS Cardiovascular: Suboptimal contrast bolus timing in the pulmonary arterial tree. Quantum mottle artifact. Mild respiratory motion artifact. No central pulmonary embolus. The lobar and distal pulmonary arteries are  not well evaluated. Mild cardiomegaly is new since 2013. No pericardial effusion. Negative thoracic aorta. Mediastinum/Nodes: No lymphadenopathy. Lungs/Pleura: Major airways are patent except for atelectatic changes. There is a calcified granuloma in the right upper lobe. Lower lung volumes compared to 2013 with crowding of lung markings but no abnormal pulmonary opacity or pleural effusion. Musculoskeletal: No acute osseous abnormality identified. Review of the MIP images confirms the above findings. CT ABDOMEN and PELVIS FINDINGS Hepatobiliary: The liver appears stable and within normal limits. Gallbladder detail is obscured, but the gallbladder is not abnormally dilated. Pancreas: The pancreatic tail appears within normal limits, the body and head of the pancreas are obscured by quantum mottle. Spleen: The spleen appears stable and within normal limits. Adrenals/Urinary Tract: No hydronephrosis. On delayed images there is bilateral renal excretion of IV contrast. The urinary bladder is distended but otherwise appears normal. Stomach/Bowel: Negative rectum. The splenic flexure and distal transverse colon appear within normal limits. Proximal small bowel loops and the hepatic flexure appear within normal limits on the delayed excretory images. Other small and large bowel loops are obscured by quantum mottle. Negative proximal stomach. Vascular/Lymphatic: Numerous chronic venous collaterals about both inguinal regions and the lower abdominal panniculus. Chronic right inguinal postoperative changes with numerous surgical clips, stable since 2013. Quantum mottle degrades detail of the abdominal aorta and iliac arteries, the abdominal aorta and proximal common iliac arteries appear within normal limits on the delayed excretory phase images. Chronically enlarged bilateral inguinal lymph nodes appear not significantly changed since 2013. There is evidence of right greater than left external iliac artery lymphadenopathy  which has increased since 2013 with individual nodes measuring up to 27 mm short axis (previously 18 mm). The mesenteric and retroperitoneal spaces in the abdomen and pelvis are poorly visualized due to quantum mottle. It is difficult to exclude retroperitoneal lymphadenopathy in the upper abdomen. Reproductive: Negative. Other: No pelvic free fluid. Musculoskeletal: Lumbar and pelvic bone detail degraded by body habitus/quantum mottle. No acute osseous abnormality identified. Review of the MIP images confirms the above findings. IMPRESSION: Limited diagnostic information, especially in the abdomen, related to large patient size. The following conclusions can be made: - No central pulmonary embolus, with lobar and distal pulmonary arteries not well evaluated. - Negative aorta. Mild cardiomegaly is new since 2013. No pericardial effusion. - Pulmonary atelectasis, but no other lung abnormality or or pleural effusion. - No renal obstruction. - Chronic inguinal lymphadenopathy appears stable since 2013 although right external iliac artery adenopathy has progressed and is now moderate to severe (individual nodes up to 27 mm short axis). This is nonspecific. Sequelae of chronic infection/inflammation seems more likely than a systemic lymphoproliferative disorder or Lymphoma considering lack of thoracic and no definite upper abdominal lymphadenopathy. - Chronic venous collaterals about the pelvis, stable  since 2013. Electronically Signed   By: Odessa Fleming M.D.   On: 03/20/2016 13:56   Ct Abdomen Pelvis W Contrast  Result Date: 03/20/2016 CLINICAL DATA:  36 year old male with super morbid obesity (631 pounds). Increasing shortness of breath. Increasing weight and abdominal girth recently. Hypertensive at presentation. Initial encounter. EXAM: CT ANGIOGRAPHY CHEST CT ABDOMEN AND PELVIS WITH CONTRAST TECHNIQUE: Multidetector CT imaging of the chest was performed using the standard protocol during bolus administration of  intravenous contrast. Multiplanar CT image reconstructions and MIPs were obtained to evaluate the vascular anatomy. Multidetector CT imaging of the abdomen and pelvis was performed using the standard protocol during bolus administration of intravenous contrast. CONTRAST:  100 mL Isovue 370 COMPARISON:  CT Abdomen and Pelvis 11/05/2011, 05/09/2009 FINDINGS: CTA CHEST FINDINGS Cardiovascular: Suboptimal contrast bolus timing in the pulmonary arterial tree. Quantum mottle artifact. Mild respiratory motion artifact. No central pulmonary embolus. The lobar and distal pulmonary arteries are not well evaluated. Mild cardiomegaly is new since 2013. No pericardial effusion. Negative thoracic aorta. Mediastinum/Nodes: No lymphadenopathy. Lungs/Pleura: Major airways are patent except for atelectatic changes. There is a calcified granuloma in the right upper lobe. Lower lung volumes compared to 2013 with crowding of lung markings but no abnormal pulmonary opacity or pleural effusion. Musculoskeletal: No acute osseous abnormality identified. Review of the MIP images confirms the above findings. CT ABDOMEN and PELVIS FINDINGS Hepatobiliary: The liver appears stable and within normal limits. Gallbladder detail is obscured, but the gallbladder is not abnormally dilated. Pancreas: The pancreatic tail appears within normal limits, the body and head of the pancreas are obscured by quantum mottle. Spleen: The spleen appears stable and within normal limits. Adrenals/Urinary Tract: No hydronephrosis. On delayed images there is bilateral renal excretion of IV contrast. The urinary bladder is distended but otherwise appears normal. Stomach/Bowel: Negative rectum. The splenic flexure and distal transverse colon appear within normal limits. Proximal small bowel loops and the hepatic flexure appear within normal limits on the delayed excretory images. Other small and large bowel loops are obscured by quantum mottle. Negative proximal stomach.  Vascular/Lymphatic: Numerous chronic venous collaterals about both inguinal regions and the lower abdominal panniculus. Chronic right inguinal postoperative changes with numerous surgical clips, stable since 2013. Quantum mottle degrades detail of the abdominal aorta and iliac arteries, the abdominal aorta and proximal common iliac arteries appear within normal limits on the delayed excretory phase images. Chronically enlarged bilateral inguinal lymph nodes appear not significantly changed since 2013. There is evidence of right greater than left external iliac artery lymphadenopathy which has increased since 2013 with individual nodes measuring up to 27 mm short axis (previously 18 mm). The mesenteric and retroperitoneal spaces in the abdomen and pelvis are poorly visualized due to quantum mottle. It is difficult to exclude retroperitoneal lymphadenopathy in the upper abdomen. Reproductive: Negative. Other: No pelvic free fluid. Musculoskeletal: Lumbar and pelvic bone detail degraded by body habitus/quantum mottle. No acute osseous abnormality identified. Review of the MIP images confirms the above findings. IMPRESSION: Limited diagnostic information, especially in the abdomen, related to large patient size. The following conclusions can be made: - No central pulmonary embolus, with lobar and distal pulmonary arteries not well evaluated. - Negative aorta. Mild cardiomegaly is new since 2013. No pericardial effusion. - Pulmonary atelectasis, but no other lung abnormality or or pleural effusion. - No renal obstruction. - Chronic inguinal lymphadenopathy appears stable since 2013 although right external iliac artery adenopathy has progressed and is now moderate to severe (individual nodes up  to 27 mm short axis). This is nonspecific. Sequelae of chronic infection/inflammation seems more likely than a systemic lymphoproliferative disorder or Lymphoma considering lack of thoracic and no definite upper abdominal  lymphadenopathy. - Chronic venous collaterals about the pelvis, stable since 2013. Electronically Signed   By: Odessa Fleming M.D.   On: 03/20/2016 13:56     Patient Profile    36 y.o.with chronic venous insufficiency s/p venous bypass, DM, morbid obesity, hypertension, sleep apnea, and lymphedema  admitted to the [facility] with progressive swelling, SOB, and progressive weight gain (previously 520's in 12/2015). Volume overload felt due to compressive lymphedema vs. CHF. 2D echo uninterpretable, even with definity.   Assessment & Plan   1. Volume overload/elevated troponin - unclear if due to CHF or compressive lymphedema. 2D echo uninterpretable; patient would be too large for accurate assessment by other noninvasive testing (i.e. cMRI, coronary CTA, nuc). Remains volume overloaded but excellent diuresis (-24 liters since admission); decrease lasix to 80 mg IV BID and follow renal function. Continue spironolactone 25 mg daily.  2. Essential HTN/hypertensive urgency- continue present meds  3. Hypokalemia - Improved  3. Morbid obesity - Life-threatening at this point. Strongly encourage continued f/u with bariatric surgery.   4. Anemia - per IM.  5. Abdominal wall erythema/cellulitis- per IM.  6. Sleep apnea - CPAP at night.   7. Venous insufficiency/lymphedema with ulceration - Per IM.    Signed, Olga Millers, MD  03/28/2016, 10:47 AM

## 2016-03-29 LAB — BASIC METABOLIC PANEL
Anion gap: 8 (ref 5–15)
BUN: 16 mg/dL (ref 6–20)
CHLORIDE: 99 mmol/L — AB (ref 101–111)
CO2: 31 mmol/L (ref 22–32)
CREATININE: 0.86 mg/dL (ref 0.61–1.24)
Calcium: 8.9 mg/dL (ref 8.9–10.3)
GFR calc Af Amer: >60 mL/min (ref >60)
GFR calc non Af Amer: >60 mL/min (ref >60)
Glucose, Bld: 108 mg/dL — ABNORMAL HIGH (ref 65–99)
Potassium: 3.7 mmol/L (ref 3.5–5.1)
SODIUM: 138 mmol/L (ref 135–145)

## 2016-03-29 NOTE — Progress Notes (Signed)
Pt has CPAP from home and prefers self placement.  RT to monitor and assess as needed.  

## 2016-03-29 NOTE — Progress Notes (Signed)
PROGRESS NOTE    [name]  WGN:[id number]:[id number] DOB: Sep 16, 1980 DOA: 03/20/2016 PCP: Lupe Carneyean Mitchell, MD     Brief Narrative:  [name] is a 36 y.o.malewith past medical history of morbi obesity, hypertension, sleep apnea, chronic venous stasis on LE. [facility] worsening shortness of breath at rest and with exertion over the past few days PTA. Patient stated he may have gained at least 70 pounds in the last 1 month prior to this presentation.   Assessment & Plan:   Principal Problem:   Acute respiratory failure with hypoxia (HCC) Active Problems:   Venous stasis ulcer (HCC)   Morbid obesity (HCC)   Chronic acquired lymphedema   Type 2 diabetes mellitus (HCC)   SOB (shortness of breath)   Hypertensive urgency   Abdominal pain   OSA (obstructive sleep apnea)   Acute respiratory failure (HCC)   NSVT (nonsustained ventricular tachycardia) (HCC)   Hypokalemia   Generalized edema   Volume overload - Unclear if due to CHF or compressive lymphedema  - Unable to adequately get echo results due to pt body habitus. Unable to do any further noninvasive testing due to body habitus. - Weight 1/13 was 579 lbs; weight this am is 531 lbs. In October 2017, patient was 527lbs.  - I/O -32L this admission so far  - Continue IV lasix, spironolactone. - Appreciate cardiology   Essential hypertension - Continue lisinopril, coreg 3.125 mg BID  Venous insufficiency/lymphedema with ulceration of greater than 1 year duration, POA - Evaluated by wound RN.  - Measurement: 9cm x 20cm area of involvement with 0.8cm depth at the deepest point. Dressing procedure/placement/frequency: twice weekly (Tuesday/Friday) change schedule using a silver hydrofiber dressing as a wound contact layer and suggest home care be considered as a more viable option for routine dressing changes. - HHRN for wound care teaching at discharge - Finished doxycycline  Morbid obesity due to excess calories    - Body mass index is 74.38 kg/m - Seen by nutritionist - Counseled on diet and nutrition, encourage weight loss, increase in activity and exercise  - Will need to be evaluated by bariatric surgery as outpatient   Sleep apnea - CPAP at night   DVT prophylaxis: heparin subq Code Status: Full Family Communication: no family at bedside Disposition Plan: pending further improvement in volume status. Will discharge to home when ready    Consultants:   Cardiology  Procedures:   Echo  Antimicrobials:   Doxycycline 1/10 - 1/17     Subjective: No new complaints. Continues to diurese.    Objective: Vitals:   03/28/16 0347 03/28/16 1321 03/28/16 2046 03/29/16 0450  BP: (!) 149/82 138/79 123/75 (!) 156/86  Pulse: 92 88 88 90  Resp: 19 20 19 20   Temp: 98.1 F (36.7 C) 98.5 F (36.9 C) 98.2 F (36.8 C) 98 F (36.7 C)  TempSrc: Oral Oral Oral Oral  SpO2: 100% 97% 97% 95%  Weight:    (!) 241 kg (531 lb 4.8 oz)  Height:        Intake/Output Summary (Last 24 hours) at 03/29/16 1058 Last data filed at 03/29/16 0930  Gross per 24 hour  Intake              240 ml  Output             7400 ml  Net            -7160 ml   [employer] PowerFiled Weights  03/26/16 0455 03/27/16 0639 03/29/16 0450  Weight: (!) 254.2 kg (560 lb 8 oz) (!) 252.4 kg (556 lb 6.4 oz) (!) 241 kg (531 lb 4.8 oz)    Examination:  General exam: Appears calm and comfortable  Respiratory system: Clear to auscultation. Respiratory effort normal. Cardiovascular system: S1 & S2 heard, RRR. No JVD, murmurs, rubs, gallops or clicks. +diffuse edema in dependent regions  Gastrointestinal system: Abdomen is nondistended, soft and nontender. No organomegaly or masses felt. Normal bowel sounds heard. Morbidly obese  Central nervous system: Alert and oriented. No focal neurological deficits. Extremities: Symmetric 5 x 5 power. Skin: +UNNA boot  Psychiatry: Judgement and insight appear normal. Mood & affect appropriate.   Data  Reviewed: I have personally reviewed following labs and imaging studies  CBC: No results for input(s): WBC, NEUTROABS, HGB, HCT, MCV, PLT in the last 168 hours. Basic Metabolic Panel:  Recent Labs Lab 03/24/16 0517 03/25/16 0501 03/26/16 0601 03/27/16 0615 03/28/16 0614 03/29/16 0529  NA 140 140 139 138 140 138  K 3.6 3.7 4.1 4.0 3.9 3.7  CL 101 100* 100* 100* 99* 99*  CO2 31 31 30 30  33* 31  GLUCOSE 113* 102* 113* 109* 102* 108*  BUN 14 13 14 16 17 16   CREATININE 0.91 0.87 0.88 0.93 0.80 0.86  CALCIUM 8.9 8.8* 8.8* 8.9 8.9 8.9  MG 1.9  --   --   --   --   --    GFR: Estimated Creatinine Clearance: 247.1 mL/min (by C-G formula based on SCr of 0.86 mg/dL). Liver Function Tests: No results for input(s): AST, ALT, ALKPHOS, BILITOT, PROT, ALBUMIN in the last 168 hours. No results for input(s): LIPASE, AMYLASE in the last 168 hours. No results for input(s): AMMONIA in the last 168 hours. Coagulation Profile: No results for input(s): INR, PROTIME in the last 168 hours. Cardiac Enzymes: No results for input(s): CKTOTAL, CKMB, CKMBINDEX, TROPONINI in the last 168 hours. BNP (last 3 results) No results for input(s): PROBNP in the last 8760 hours. HbA1C: No results for input(s): HGBA1C in the last 72 hours. CBG: No results for input(s): GLUCAP in the last 168 hours. Lipid Profile: No results for input(s): CHOL, HDL, LDLCALC, TRIG, CHOLHDL, LDLDIRECT in the last 72 hours. Thyroid Function Tests: No results for input(s): TSH, T4TOTAL, FREET4, T3FREE, THYROIDAB in the last 72 hours. Anemia Panel: No results for input(s): VITAMINB12, FOLATE, FERRITIN, TIBC, IRON, RETICCTPCT in the last 72 hours. Sepsis Labs: No results for input(s): PROCALCITON, LATICACIDVEN in the last 168 hours.  Recent Results (from the past 240 hour(s))  MRSA PCR Screening     Status: None   Collection Time: 03/20/16  6:40 PM  Result Value Ref Range Status   MRSA by PCR NEGATIVE NEGATIVE Final    Comment:         The GeneXpert MRSA Assay (FDA approved for NASAL specimens only), is one component of a comprehensive MRSA colonization surveillance program. It is not intended to diagnose MRSA infection nor to guide or monitor treatment for MRSA infections.        Radiology Studies: No results found.    Scheduled Meds: . carvedilol  6.25 mg Oral BID WC  . furosemide  80 mg Intravenous BID  . heparin subcutaneous  5,000 Units Subcutaneous Q8H  . lisinopril  40 mg Oral Daily  . potassium chloride  40 mEq Oral TID  . spironolactone  25 mg Oral Daily   Continuous Infusions:   LOS: 8 days  Time spent: 30 minutes   Noralee Stain, DO [facility] [email or web address] Password [email or web address] 03/29/2016, 10:58 AM

## 2016-03-29 NOTE — Progress Notes (Signed)
Progress Note  Patient Name: [name] Date of Encounter: 03/29/2016  Primary Cardiologist: Dr. Herbie BaltimoreHarding  Patient Profile     36 y.o.male with chronic venous insufficiency s/p venous bypass, DM, morbid obesity, hypertension, sleep apnea, and lymphedema  [facility], SOB, and progressive weight gain (previously 520's in 12/2015). Volume overload felt due to compressive lymphedema vs. CHF. 2D echo uninterpretable, even with definity.   Subjective   Feels ok this morning. No significant dyspnea.   Inpatient Medications    Scheduled Meds: . carvedilol  6.25 mg Oral BID WC  . furosemide  80 mg Intravenous BID  . heparin subcutaneous  5,000 Units Subcutaneous Q8H  . lisinopril  40 mg Oral Daily  . potassium chloride  40 mEq Oral TID  . spironolactone  25 mg Oral Daily   Continuous Infusions:  PRN Meds: acetaminophen **OR** acetaminophen, hydrALAZINE, HYDROcodone-acetaminophen, ondansetron **OR** ondansetron (ZOFRAN) IV   Vital Signs    Vitals:   03/28/16 0347 03/28/16 1321 03/28/16 2046 03/29/16 0450  BP: (!) 149/82 138/79 123/75 (!) 156/86  Pulse: 92 88 88 90  Resp: 19 20 19 20   Temp: 98.1 F (36.7 C) 98.5 F (36.9 C) 98.2 F (36.8 C) 98 F (36.7 C)  TempSrc: Oral Oral Oral Oral  SpO2: 100% 97% 97% 95%  Weight:    (!) 531 lb 4.8 oz (241 kg)  Height:        Intake/Output Summary (Last 24 hours) at 03/29/16 0842 Last data filed at 03/29/16 0451  Gross per 24 hour  Intake              542 ml  Output             6500 ml  Net            -5958 ml   Filed Weights   03/26/16 0455 03/27/16 0639 03/29/16 0450  Weight: (!) 560 lb 8 oz (254.2 kg) (!) 556 lb 6.4 oz (252.4 kg) (!) 531 lb 4.8 oz (241 kg)    Telemetry    Not on telemetry - Personally Reviewed  Physical Exam   GEN: No acute distress. Morbidly obese  Neck: No JVD  Cardiac: RRR, no murmurs, rubs, or gallops.  Radials/DP/PT 2+ and equal bilaterally.  Respiratory:  Clear  to auscultation bilaterally. GI: Soft, nontender, non-distended, obese   MS: lymphedema, venous stasis dermatitis bilaterally  Neuro:  Alert and oriented x 3  Labs    Chemistry Recent Labs Lab 03/27/16 0615 03/28/16 0614 03/29/16 0529  NA 138 140 138  K 4.0 3.9 3.7  CL 100* 99* 99*  CO2 30 33* 31  GLUCOSE 109* 102* 108*  BUN 16 17 16   CREATININE 0.93 0.80 0.86  CALCIUM 8.9 8.9 8.9  GFRNONAA >60 >60 >60  GFRAA >60 >60 >60  ANIONGAP 8 8 8      HematologyNo results for input(s): WBC, RBC, HGB, HCT, MCV, MCH, MCHC, RDW, PLT in the last 168 hours.  Cardiac EnzymesNo results for input(s): TROPONINI in the last 168 hours. No results for input(s): TROPIPOC in the last 168 hours.   BNPNo results for input(s): BNP, PROBNP in the last 168 hours.   DDimer No results for input(s): DDIMER in the last 168 hours.   Radiology    No results found.  Cardiac Studies   2D echo 03/21/16 Impressions:  - Uninterpretable study, even after Definity contrast   administration.   Patient Profile  35 y.o.male with chronic venous insufficiency s/p venous bypass, DM, morbid obesity, hypertension, sleep apnea, and lymphedema  admitted to [facility] with progressive swelling, SOB, and progressive weight gain (previously 520's in 12/2015). Volume overload felt due to compressive lymphedema vs. CHF. 2D echo uninterpretable, even with definity.   Assessment & Plan    1. Volume overload/elevated troponin - unclear if due to CHF or compressive lymphedema.2D echo uninterpretable; patient would be too large for accurate assessment by other noninvasive testing (i.e. cMRI, coronary CTA, nuc). Remains volume overloaded but excellent diuresis (-30 liters since admission); 100 lb weight loss since admit, 631 >>531 lb. Lasix was decreased yesterday down to 80 mg IV BID. He continued to have excellent diuresis after change. Documented UOP yesterday was 6.7 L. Renal function remains stable. SCr 0.86. K stable.  Continue spironolactone 25 mg daily.  2. Essential HTN/hypertensive urgency- Moderately elevated but stable. Most recent BP 156/86. Continue present meds and monitor.   3. Hypokalemia - corrected with supplementation. K WNL at 3.7 today.   3. Morbid obesity - Life-threatening at this point. Strongly encourage continued f/u with bariatric surgery.   4. Anemia - per IM.  5. Abdominal wall erythema/cellulitis- per IM.  6. Sleep apnea - CPAP at night.   7. Venous insufficiency/lymphedema with ulceration - Per IM.   Signed, Robbie Lis, PA-C  03/29/2016, 8:42 AM   As above, patient seen and examined. He continues to diurese briskly. He is now -32 L since admission. He remains somewhat volume overloaded. Continue present dose of Lasix. He will likely need 2-3 more days of IV diuresis. Follow renal function closely. He will need follow-up with bariatric surgery after discharge.  Olga Millers, MD

## 2016-03-30 LAB — BASIC METABOLIC PANEL
Anion gap: 8 (ref 5–15)
BUN: 18 mg/dL (ref 6–20)
CHLORIDE: 100 mmol/L — AB (ref 101–111)
CO2: 29 mmol/L (ref 22–32)
Calcium: 9 mg/dL (ref 8.9–10.3)
Creatinine, Ser: 0.85 mg/dL (ref 0.61–1.24)
GFR calc Af Amer: >60 mL/min (ref >60)
GFR calc non Af Amer: >60 mL/min (ref >60)
Glucose, Bld: 108 mg/dL — ABNORMAL HIGH (ref 65–99)
POTASSIUM: 4.4 mmol/L (ref 3.5–5.1)
SODIUM: 137 mmol/L (ref 135–145)

## 2016-03-30 NOTE — Progress Notes (Signed)
Pt has CPAP from home and prefers self placement.  RT to monitor and assess as needed.  

## 2016-03-30 NOTE — Progress Notes (Signed)
Progress Note  Patient Name: [name] Date of Encounter: 03/30/2016  Primary Cardiologist: Dr. Bryan Lemma  Subjective   No shortness of breath at rest. No chest pain.  Inpatient Medications    Scheduled Meds: . carvedilol  6.25 mg Oral BID WC  . furosemide  80 mg Intravenous BID  . heparin subcutaneous  5,000 Units Subcutaneous Q8H  . lisinopril  40 mg Oral Daily  . potassium chloride  40 mEq Oral TID  . spironolactone  25 mg Oral Daily    PRN Meds: acetaminophen **OR** acetaminophen, hydrALAZINE, HYDROcodone-acetaminophen, ondansetron **OR** ondansetron (ZOFRAN) IV   Vital Signs    Vitals:   03/29/16 0450 03/29/16 1400 03/29/16 2041 03/30/16 0458  BP: (!) 156/86 (!) 143/83 (!) 146/89 (!) 152/86  Pulse: 90 86 86 85  Resp: 20 20 18 18   Temp: 98 F (36.7 C) 98.1 F (36.7 C) 97.4 F (36.3 C) 98.3 F (36.8 C)  TempSrc: Oral Oral Oral Oral  SpO2: 95% 99% 99% 100%  Weight: (!) 531 lb 4.8 oz (241 kg)   (!) 521 lb 3.2 oz (236.4 kg)  Height:        Intake/Output Summary (Last 24 hours) at 03/30/16 0815 Last data filed at 03/30/16 0503  Gross per 24 hour  Intake              680 ml  Output             5475 ml  Net            -4795 ml   Filed Weights   03/27/16 0639 03/29/16 0450 03/30/16 0458  Weight: (!) 556 lb 6.4 oz (252.4 kg) (!) 531 lb 4.8 oz (241 kg) (!) 521 lb 3.2 oz (236.4 kg)    Telemetry    Not on telemetry.  ECG    I personally reviewed the tracing from 03/20/2016 which showed sinus rhythm with low voltage, poor R wave progression and nonspecific T wave changes.  Physical Exam   GEN: Morbidly obese. No acute distress.  Neck: Cannot assess JVP. Cardiac: RRR.  Respiratory: Clear to auscultation bilaterally. GI: Soft, nontender, non-distended  MS: Lymphedema and venous stasis.  Labs    Chemistry Recent Labs Lab 03/28/16 [phone] 03/29/16 0529 03/30/16 0552  NA 140 138 137  K 3.9 3.7 4.4  CL 99* 99* 100*  CO2 33* 31 29  GLUCOSE  102* 108* 108*  BUN 17 16 18   CREATININE 0.80 0.86 0.85  CALCIUM 8.9 8.9 9.0  GFRNONAA >60 >60 >60  GFRAA >60 >60 >60  ANIONGAP 8 8 8      Radiology    No results found.  Cardiac Studies   Echocardiogram 03/21/2016: Impressions:  - Uninterpretable study, even after Definity contrast   administration.  Patient Profile     36 y.o.male with chronic venous insufficiency s/p venous bypass, DM, morbid obesity, hypertension, sleep apnea, and lymphedema admitted to [facility] with progressive swelling, SOB, and progressive weight gain (previously 520's in 12/2015).Volume overload felt due to compressive lymphedema vs. CHF. 2D echo uninterpretable, even with Definity. Diuresing well on IV Lasix.  Assessment & Plan    1. Severe volume overload - unclear if due to CHF (sysolic or diastolic), possibly compressive lymphedema.2D echocardiogram uninterpretable even with Definity and body habitus limits other noninvasive assessments of LVEF. Has nad excellent diuresis with greater than 100 lb weight loss since admit, 631 >>521 lb. Lasix now at 80 mg IV BID. Renal function remains stable. SCr  0.85. K stable. Continuespironolactone 25 mg daily.  2. Essential HTN- Continue present regimen.  3. Morbid obesity- Life-threatening at this point. Continued f/u with bariatric surgery has been discussed.  4. Abdominal wall erythema/cellulitis- per IM.  5. Sleep apnea - CPAP at night.   6. Venous insufficiency/lymphedema with ulceration - Per IM.   Weight is at baseline from October 2017, but still diuresing well without significant change in renal function, Would continue IV Lasix at current dose for now. Follow-up BMET in a.m.  Signed, Nona DellSamuel Chanee Henrickson, MD  03/30/2016, 8:15 AM

## 2016-03-30 NOTE — Progress Notes (Addendum)
PROGRESS NOTE    Shawn Majordrian T Haywood  WGN:[id number]:[id number] DOB: September 24, 1980 DOA: 03/20/2016 PCP: Lupe Carneyean Mitchell, MD     Brief Narrative:  [name] is a [age] y.o.malewith past medical history of morbi obesity, hypertension, sleep apnea, chronic venous stasis on LE. [facility] shortness of breath at rest and with exertion over the past few days PTA. Patient stated he may have gained at least 70 pounds in the last 1 month prior to this presentation.   Assessment & Plan:   Principal Problem:   Acute respiratory failure with hypoxia (HCC) Active Problems:   Venous stasis ulcer (HCC)   Morbid obesity (HCC)   Chronic acquired lymphedema   Type 2 diabetes mellitus (HCC)   SOB (shortness of breath)   Hypertensive urgency   Abdominal pain   OSA (obstructive sleep apnea)   Acute respiratory failure (HCC)   NSVT (nonsustained ventricular tachycardia) (HCC)   Hypokalemia   Generalized edema   Volume overload - Unclear if due to CHF or compressive lymphedema  - Unable to adequately get echo results due to pt body habitus. Unable to do any further noninvasive testing due to body habitus. - Weight 1/13 was 579 lbs; weight this am is 521 lbs. In October 2017, patient was 527lbs.  - I/O -35L this admission so far  - Continue IV lasix, spironolactone. - Appreciate cardiology   Essential hypertension - Continue lisinopril, coreg 3.125 mg BID  Venous insufficiency/lymphedema with ulceration of greater than 1 year duration, POA - Evaluated by wound RN.  - Measurement: 9cm x 20cm area of involvement with 0.8cm depth at the deepest point. Dressing procedure/placement/frequency: twice weekly (Tuesday/Friday) change schedule using a silver hydrofiber dressing as a wound contact layer and suggest home care be considered as a more viable option for routine dressing changes. - HHRN for wound care teaching at discharge - Finished doxycycline  Morbid obesity due to excess calories    - Body mass index is 74.38 kg/m - Seen by nutritionist - Counseled on diet and nutrition, encourage weight loss, increase in activity and exercise  - Will need to be evaluated by bariatric surgery as outpatient   Sleep apnea - CPAP at night   DVT prophylaxis: heparin subq Code Status: Full Family Communication: no family at bedside Disposition Plan: pending further improvement in volume status. Will discharge to home when ready    Consultants:   Cardiology  Procedures:   Echo  Antimicrobials:   Doxycycline 1/10 - 1/17     Subjective: No new complaints. Continues to diurese. Ambulating more and doing stretches in room    Objective: Vitals:   03/29/16 2041 03/30/16 0458 03/30/16 0917 03/30/16 0920  BP: (!) 146/89 (!) 152/86 (!) 149/113 (!) 156/86  Pulse: 86 85 92   Resp: 18 18    Temp: 97.4 F (36.3 C) 98.3 F (36.8 C)    TempSrc: Oral Oral    SpO2: 99% 100% 98%   Weight:  (!) 236.4 kg (521 lb 3.2 oz)    Height:        Intake/Output Summary (Last 24 hours) at 03/30/16 1142 Last data filed at 03/30/16 1016  Gross per 24 hour  Intake              680 ml  Output             5275 ml  Net            -4595 ml   Ceasar MonsFiled  Weights   03/27/16 0639 03/29/16 0450 03/30/16 0458  Weight: (!) 252.4 kg (556 lb 6.4 oz) (!) 241 kg (531 lb 4.8 oz) (!) 236.4 kg (521 lb 3.2 oz)    Examination:  General exam: Appears calm and comfortable  Respiratory system: Clear to auscultation. Respiratory effort normal. Cardiovascular system: S1 & S2 heard, RRR. No JVD, murmurs, rubs, gallops or clicks. +edema in dependent regions  Gastrointestinal system: Abdomen is nondistended, soft and nontender. No organomegaly or masses felt. Normal bowel sounds heard. Morbidly obese  Central nervous system: Alert and oriented. No focal neurological deficits. Extremities: Symmetric 5 x 5 power. Skin: +UNNA boot  Psychiatry: Judgement and insight appear normal. Mood & affect appropriate.   Data  Reviewed: I have personally reviewed following labs and imaging studies  CBC: No results for input(s): WBC, NEUTROABS, HGB, HCT, MCV, PLT in the last 168 hours. Basic Metabolic Panel:  Recent Labs Lab 03/24/16 0517  03/26/16 0601 03/27/16 0615 03/28/16 1610 03/29/16 0529 03/30/16 0552  NA 140  < > 139 138 140 138 137  K 3.6  < > 4.1 4.0 3.9 3.7 4.4  CL 101  < > 100* 100* 99* 99* 100*  CO2 31  < > 30 30 33* 31 29  GLUCOSE 113*  < > 113* 109* 102* 108* 108*  BUN 14  < > 14 16 17 16 18   CREATININE 0.91  < > 0.88 0.93 0.80 0.86 0.85  CALCIUM 8.9  < > 8.8* 8.9 8.9 8.9 9.0  MG 1.9  --   --   --   --   --   --   < > = values in this interval not displayed. GFR: Estimated Creatinine Clearance: 246.9 mL/min (by C-G formula based on SCr of 0.85 mg/dL). Liver Function Tests: No results for input(s): AST, ALT, ALKPHOS, BILITOT, PROT, ALBUMIN in the last 168 hours. No results for input(s): LIPASE, AMYLASE in the last 168 hours. No results for input(s): AMMONIA in the last 168 hours. Coagulation Profile: No results for input(s): INR, PROTIME in the last 168 hours. Cardiac Enzymes: No results for input(s): CKTOTAL, CKMB, CKMBINDEX, TROPONINI in the last 168 hours. BNP (last 3 results) No results for input(s): PROBNP in the last 8760 hours. HbA1C: No results for input(s): HGBA1C in the last 72 hours. CBG: No results for input(s): GLUCAP in the last 168 hours. Lipid Profile: No results for input(s): CHOL, HDL, LDLCALC, TRIG, CHOLHDL, LDLDIRECT in the last 72 hours. Thyroid Function Tests: No results for input(s): TSH, T4TOTAL, FREET4, T3FREE, THYROIDAB in the last 72 hours. Anemia Panel: No results for input(s): VITAMINB12, FOLATE, FERRITIN, TIBC, IRON, RETICCTPCT in the last 72 hours. Sepsis Labs: No results for input(s): PROCALCITON, LATICACIDVEN in the last 168 hours.  Recent Results (from the past 240 hour(s))  MRSA PCR Screening     Status: None   Collection Time: 03/20/16   6:40 PM  Result Value Ref Range Status   MRSA by PCR NEGATIVE NEGATIVE Final    Comment:        The GeneXpert MRSA Assay (FDA approved for NASAL specimens only), is one component of a comprehensive MRSA colonization surveillance program. It is not intended to diagnose MRSA infection nor to guide or monitor treatment for MRSA infections.        Radiology Studies: No results found.    Scheduled Meds: . carvedilol  6.25 mg Oral BID WC  . furosemide  80 mg Intravenous BID  . heparin subcutaneous  5,000 Units Subcutaneous Q8H  . lisinopril  40 mg Oral Daily  . potassium chloride  40 mEq Oral TID  . spironolactone  25 mg Oral Daily   Continuous Infusions:   LOS: 9 days    Time spent: 30 minutes   Noralee Stain, DO [facility] [email or web address] Password [email or web address] 03/30/2016, 11:42 AM

## 2016-03-31 LAB — BASIC METABOLIC PANEL
ANION GAP: 8 (ref 5–15)
BUN: 18 mg/dL (ref 6–20)
CO2: 30 mmol/L (ref 22–32)
Calcium: 8.9 mg/dL (ref 8.9–10.3)
Chloride: 98 mmol/L — ABNORMAL LOW (ref 101–111)
Creatinine, Ser: 0.86 mg/dL (ref 0.61–1.24)
GFR calc Af Amer: >60 mL/min (ref >60)
GFR calc non Af Amer: >60 mL/min (ref >60)
GLUCOSE: 109 mg/dL — AB (ref 65–99)
POTASSIUM: 4.1 mmol/L (ref 3.5–5.1)
Sodium: 136 mmol/L (ref 135–145)

## 2016-03-31 NOTE — Progress Notes (Signed)
Progress Note  Patient Name: [name] Date of Encounter: 03/31/2016  Primary Cardiologist: Dr. Bryan Lemmaavid Harding  Subjective   Continues to feel better with additional weight loss. No chest pain.  Inpatient Medications    Scheduled Meds: . carvedilol  6.25 mg Oral BID WC  . furosemide  80 mg Intravenous BID  . heparin subcutaneous  5,000 Units Subcutaneous Q8H  . lisinopril  40 mg Oral Daily  . potassium chloride  40 mEq Oral TID  . spironolactone  25 mg Oral Daily    PRN Meds: acetaminophen **OR** acetaminophen, hydrALAZINE, HYDROcodone-acetaminophen, ondansetron **OR** ondansetron (ZOFRAN) IV   Vital Signs    Vitals:   03/30/16 1412 03/30/16 1705 03/30/16 2103 03/31/16 0622  BP: (!) 150/88 117/67 (!) 158/90 128/82  Pulse: 84 87 82 82  Resp: 18  16 16   Temp: 97.9 F (36.6 C)  97.8 F (36.6 C) 98.3 F (36.8 C)  TempSrc: Oral  Oral Oral  SpO2: 100%  100% 100%  Weight:    (!) 512 lb 6.4 oz (232.4 kg)  Height:        Intake/Output Summary (Last 24 hours) at 03/31/16 0827 Last data filed at 03/31/16 0631  Gross per 24 hour  Intake              960 ml  Output             8900 ml  Net            -7940 ml   Filed Weights   03/29/16 0450 03/30/16 0458 03/31/16 0622  Weight: (!) 531 lb 4.8 oz (241 kg) (!) 521 lb 3.2 oz (236.4 kg) (!) 512 lb 6.4 oz (232.4 kg)    Telemetry    Not on telemetry.  ECG    I personally reviewed the tracing from 03/20/2016 which showed sinus rhythm with low voltage, poor R wave progression and nonspecific T wave changes.  Physical Exam   GEN: Morbidly obese. No acute distress.  Neck: Cannot assess JVP. Cardiac: RRR.  Respiratory: Clear to auscultation bilaterally. GI: Pannus, nontender  MS: Lymphedema and venous stasis.  Labs    Chemistry  Recent Labs Lab 03/29/16 0529 03/30/16 0552 03/31/16 0600  NA 138 137 136  K 3.7 4.4 4.1  CL 99* 100* 98*  CO2 31 29 30   GLUCOSE 108* 108* 109*  BUN 16 18 18   CREATININE 0.86  0.85 0.86  CALCIUM 8.9 9.0 8.9  GFRNONAA >60 >60 >60  GFRAA >60 >60 >60  ANIONGAP 8 8 8      Radiology    No results found.  Cardiac Studies   Echocardiogram 03/21/2016: Impressions:  - Uninterpretable study, even after Definity contrast   administration.  Patient Profile     36 y.o.male with chronic venous insufficiency s/p venous bypass, DM, morbid obesity, hypertension, sleep apnea, and lymphedema [facility] with progressive swelling, SOB, and progressive weight gain (previously 520's in 12/2015).Volume overload felt due to compressive lymphedema vs. CHF. 2D echo uninterpretable, even with Definity. Diuresing well on IV Lasix.  Assessment & Plan    1. Severe volume overload - unclear if due to CHF (sysolic or diastolic), possibly compressive lymphedema.2D echocardiogram uninterpretable even with Definity and body habitus limits other noninvasive assessments of LVEF. Has nad excellent diuresis with greater than 100 lb weight loss since admit, 631 >>512 lb. Lasix now at 80 mg IV BID. Renal function remains stable. True baseline weight is not certain.  2. Essential HTN-  Continue present regimen.  3. Morbid obesity- Life-threatening at this point. Continued f/u with bariatric surgery has been discussed.  4. Abdominal wall erythema/cellulitis- per IM.  5. Sleep apnea - CPAP at night.   6. Venous insufficiency/lymphedema with ulceration - Per IM.   He is still diuresing quite well without significant change in renal function, Would continue IV Lasix at current dose for now. May not be at true baseline fluid weight. Follow-up BMET in a.m.  Signed, Nona Dell, MD  03/31/2016, 8:27 AM

## 2016-03-31 NOTE — Progress Notes (Signed)
PROGRESS NOTE    [name]  QMV:[id number] DOB: [birth year] DOA: 03/20/2016 PCP: Lupe Carneyean Mitchell, MD     Brief Narrative:  [name] is a 36 y.o.malewith past medical history of morbi obesity, hypertension, sleep apnea, chronic venous stasis on LE. [facility] shortness of breath at rest and with exertion over the past few days PTA. Patient stated he may have gained at least 70 pounds in the last 1 month prior to this presentation.   Assessment & Plan:   Principal Problem:   Acute respiratory failure with hypoxia (HCC) Active Problems:   Venous stasis ulcer (HCC)   Morbid obesity (HCC)   Chronic acquired lymphedema   Type 2 diabetes mellitus (HCC)   SOB (shortness of breath)   Hypertensive urgency   Abdominal pain   OSA (obstructive sleep apnea)   Acute respiratory failure (HCC)   NSVT (nonsustained ventricular tachycardia) (HCC)   Hypokalemia   Generalized edema   Volume overload - Unclear if due to CHF or compressive lymphedema  - Unable to adequately get echo results due to pt body habitus. Unable to do any further noninvasive testing due to body habitus. - Weight 1/13 was 579 lbs; weight this am is 512 lbs. In October 2017, patient was 527lbs.  - I/O -43L this admission so far  - Continue IV lasix, spironolactone. - Appreciate cardiology   Essential hypertension - Continue lisinopril, coreg 3.125 mg BID  Venous insufficiency/lymphedema with ulceration of greater than 1 year duration, POA - Evaluated by wound RN.  - Measurement: 9cm x 20cm area of involvement with 0.8cm depth at the deepest point. Dressing procedure/placement/frequency: twice weekly (Tuesday/Friday) change schedule using a silver hydrofiber dressing as a wound contact layer and suggest home care be considered as a more viable option for routine dressing changes. - HHRN for wound care teaching at discharge - Finished doxycycline  Morbid obesity due to excess calories    - Body mass index is 74.38 kg/m - Seen by nutritionist - Counseled on diet and nutrition, encourage weight loss, increase in activity and exercise  - Will need to be evaluated by bariatric surgery as outpatient   Sleep apnea - CPAP at night   DVT prophylaxis: heparin subq Code Status: Full Family Communication: no family at bedside Disposition Plan: pending further improvement in volume status. Will discharge to home when ready    Consultants:   Cardiology  Procedures:   Echo  Antimicrobials:   Doxycycline 1/10 - 1/17     Subjective: No new complaints    Objective: Vitals:   03/30/16 1412 03/30/16 1705 03/30/16 2103 03/31/16 0622  BP: (!) 150/88 117/67 (!) 158/90 128/82  Pulse: 84 87 82 82  Resp: 18  16 16   Temp: 97.9 F (36.6 C)  97.8 F (36.6 C) 98.3 F (36.8 C)  TempSrc: Oral  Oral Oral  SpO2: 100%  100% 100%  Weight:    (!) 232.4 kg (512 lb 6.4 oz)  Height:        Intake/Output Summary (Last 24 hours) at 03/31/16 1141 Last data filed at 03/31/16 1029  Gross per 24 hour  Intake              960 ml  Output             8275 ml  Net            -7315 ml   Filed Weights   03/29/16 0450 03/30/16 0458 03/31/16 [id number]  Weight: (!) 241 kg (531 lb 4.8 oz) (!) 236.4 kg (521 lb 3.2 oz) (!) 232.4 kg (512 lb 6.4 oz)    Examination:  General exam: Appears calm and comfortable  Respiratory system: Clear to auscultation. Respiratory effort normal. Cardiovascular system: S1 & S2 heard, RRR. No JVD, murmurs, rubs, gallops or clicks. +edema in dependent regions  Gastrointestinal system: Abdomen is nondistended, soft and nontender. No organomegaly or masses felt. Normal bowel sounds heard. Morbidly obese  Central nervous system: Alert and oriented. No focal neurological deficits. Extremities: Symmetric 5 x 5 power. Skin: +UNNA boot  Psychiatry: Judgement and insight appear normal. Mood & affect appropriate.   Data Reviewed: I have personally reviewed following  labs and imaging studies  CBC: No results for input(s): WBC, NEUTROABS, HGB, HCT, MCV, PLT in the last 168 hours. Basic Metabolic Panel:  Recent Labs Lab 03/27/16 0615 03/28/16 0614 03/29/16 0529 03/30/16 0552 03/31/16 0600  NA 138 140 138 137 136  K 4.0 3.9 3.7 4.4 4.1  CL 100* 99* 99* 100* 98*  CO2 30 33* 31 29 30   GLUCOSE 109* 102* 108* 108* 109*  BUN 16 17 16 18 18   CREATININE 0.93 0.80 0.86 0.85 0.86  CALCIUM 8.9 8.9 8.9 9.0 8.9   GFR: Estimated Creatinine Clearance: 241.3 mL/min (by C-G formula based on SCr of 0.86 mg/dL). Liver Function Tests: No results for input(s): AST, ALT, ALKPHOS, BILITOT, PROT, ALBUMIN in the last 168 hours. No results for input(s): LIPASE, AMYLASE in the last 168 hours. No results for input(s): AMMONIA in the last 168 hours. Coagulation Profile: No results for input(s): INR, PROTIME in the last 168 hours. Cardiac Enzymes: No results for input(s): CKTOTAL, CKMB, CKMBINDEX, TROPONINI in the last 168 hours. BNP (last 3 results) No results for input(s): PROBNP in the last 8760 hours. HbA1C: No results for input(s): HGBA1C in the last 72 hours. CBG: No results for input(s): GLUCAP in the last 168 hours. Lipid Profile: No results for input(s): CHOL, HDL, LDLCALC, TRIG, CHOLHDL, LDLDIRECT in the last 72 hours. Thyroid Function Tests: No results for input(s): TSH, T4TOTAL, FREET4, T3FREE, THYROIDAB in the last 72 hours. Anemia Panel: No results for input(s): VITAMINB12, FOLATE, FERRITIN, TIBC, IRON, RETICCTPCT in the last 72 hours. Sepsis Labs: No results for input(s): PROCALCITON, LATICACIDVEN in the last 168 hours.  No results found for this or any previous visit (from the past 240 hour(s)).     Radiology Studies: No results found.    Scheduled Meds: . carvedilol  6.25 mg Oral BID WC  . furosemide  80 mg Intravenous BID  . heparin subcutaneous  5,000 Units Subcutaneous Q8H  . lisinopril  40 mg Oral Daily  . potassium chloride  40  mEq Oral TID  . spironolactone  25 mg Oral Daily   Continuous Infusions:   LOS: 10 days    Time spent: 30 minutes   Noralee Stain, DO [facility] [email or web address] Password [facility] 03/31/2016, 11:41 AM

## 2016-03-31 NOTE — Progress Notes (Signed)
Pt has home CPAP and prefers self placement.  RT to monitor and assess as needed.  

## 2016-04-01 ENCOUNTER — Ambulatory Visit: Payer: Managed Care, Other (non HMO) | Admitting: Skilled Nursing Facility1

## 2016-04-01 DIAGNOSIS — R0602 Shortness of breath: Secondary | ICD-10-CM

## 2016-04-01 DIAGNOSIS — I509 Heart failure, unspecified: Secondary | ICD-10-CM

## 2016-04-01 LAB — BASIC METABOLIC PANEL
Anion gap: 10 (ref 5–15)
BUN: 18 mg/dL (ref 6–20)
CHLORIDE: 101 mmol/L (ref 101–111)
CO2: 26 mmol/L (ref 22–32)
CREATININE: 0.81 mg/dL (ref 0.61–1.24)
Calcium: 8.9 mg/dL (ref 8.9–10.3)
GFR calc Af Amer: >60 mL/min (ref >60)
GFR calc non Af Amer: >60 mL/min (ref >60)
GLUCOSE: 96 mg/dL (ref 65–99)
Potassium: 4.5 mmol/L (ref 3.5–5.1)
SODIUM: 137 mmol/L (ref 135–145)

## 2016-04-01 NOTE — Progress Notes (Signed)
PROGRESS NOTE    [name]  ZOX:[id number] DOB: October 06, 1980 DOA: 03/20/2016 PCP: Lupe Carney, MD     Brief Narrative:  [name] is a 36 y.o.malewith past medical history of morbi obesity, hypertension, sleep apnea, chronic venous stasis on LE. He presented to [facility] with worsening shortness of breath at rest and with exertion over the past few days PTA. Patient stated he may have gained at least 70 pounds in the last 1 month prior to this presentation.   Assessment & Plan:   Principal Problem:   Acute respiratory failure with hypoxia (HCC) Active Problems:   Venous stasis ulcer (HCC)   Morbid obesity (HCC)   Chronic acquired lymphedema   Type 2 diabetes mellitus (HCC)   SOB (shortness of breath)   Hypertensive urgency   Abdominal pain   OSA (obstructive sleep apnea)   Acute respiratory failure (HCC)   NSVT (nonsustained ventricular tachycardia) (HCC)   Hypokalemia   Generalized edema   Volume overload - Unclear if due to CHF or compressive lymphedema  - Unable to adequately get echo results due to pt body habitus. Unable to do any further noninvasive testing due to body habitus. - Continues to diurese well  - Continue IV lasix, spironolactone - Appreciate cardiology, awaiting recommendations for PO dosage once stable   Essential hypertension - Continue lisinopril, coreg 3.125 mg BID  Venous insufficiency/lymphedema with ulceration of greater than 1 year duration, POA - Evaluated by wound RN.  - Measurement: 9cm x 20cm area of involvement with 0.8cm depth at the deepest point. Dressing procedure/placement/frequency: twice weekly (Tuesday/Friday) change schedule using a silver hydrofiber dressing as a wound contact layer and suggest home care be considered as a more viable option for routine dressing changes. - HHRN for wound care teaching at discharge - Finished doxycycline  Morbid obesity due to excess calories  - Body mass index is 74.38 kg/m - Seen by  nutritionist - Counseled on diet and nutrition, encourage weight loss, increase in activity and exercise  - Will need to be evaluated by bariatric surgery as outpatient   Sleep apnea - CPAP at night   DVT prophylaxis: heparin subq Code Status: Full Family Communication: no family at bedside Disposition Plan: pending further improvement in volume status. Will discharge to home when ready    Consultants:   Cardiology  Procedures:   Echo  Antimicrobials:   Doxycycline 1/10 - 1/17     Subjective: He states that he feels like his right hip is "out of socket". He is able to bear weight on right leg and is able to ambulate. He states that he is unable to flex his right leg as far when compared to his left leg  Objective: Vitals:   03/31/16 0622 03/31/16 1327 03/31/16 2022 04/01/16 0401  BP: 128/82 (!) 132/59 131/84 135/75  Pulse: 82 86 87 90  Resp: 16 18 18 18   Temp: 98.3 F (36.8 C) 97.5 F (36.4 C) 98 F (36.7 C) 98.1 F (36.7 C)  TempSrc: Oral Oral Oral Oral  SpO2: 100% 100% 97% 100%  Weight: (!) 232.4 kg (512 lb 6.4 oz)   (!) 229.9 kg (506 lb 14.4 oz)  Height:        Intake/Output Summary (Last 24 hours) at 04/01/16 1201 Last data filed at 04/01/16 1154  Gross per 24 hour  Intake             1320 ml  Output  3725 ml  Net            -2405 ml   Filed Weights   03/30/16 0458 03/31/16 0622 04/01/16 0401  Weight: (!) 236.4 kg (521 lb 3.2 oz) (!) 232.4 kg (512 lb 6.4 oz) (!) 229.9 kg (506 lb 14.4 oz)    Examination:  General exam: Appears calm and comfortable  Respiratory system: Clear to auscultation. Respiratory effort normal. Cardiovascular system: S1 & S2 heard, RRR. No JVD, murmurs, rubs, gallops or clicks. +edema in dependent regions, improving  Gastrointestinal system: Abdomen is nondistended, soft and nontender. No organomegaly or masses felt. Normal bowel sounds heard. Morbidly obese  Central nervous system: Alert and oriented. No focal  neurological deficits. Extremities: Symmetric 5 x 5 power. Skin: +UNNA boot  Psychiatry: Judgement and insight appear normal. Mood & affect appropriate.   Data Reviewed: I have personally reviewed following labs and imaging studies  CBC: No results for input(s): WBC, NEUTROABS, HGB, HCT, MCV, PLT in the last 168 hours. Basic Metabolic Panel:  Recent Labs Lab 03/28/16 0614 03/29/16 0529 03/30/16 0552 03/31/16 0600 04/01/16 0459  NA 140 138 137 136 137  K 3.9 3.7 4.4 4.1 4.5  CL 99* 99* 100* 98* 101  CO2 33* 31 29 30 26   GLUCOSE 102* 108* 108* 109* 96  BUN 17 16 18 18 18   CREATININE 0.80 0.86 0.85 0.86 0.81  CALCIUM 8.9 8.9 9.0 8.9 8.9   GFR: Estimated Creatinine Clearance: 254.4 mL/min (by C-G formula based on SCr of 0.81 mg/dL). Liver Function Tests: No results for input(s): AST, ALT, ALKPHOS, BILITOT, PROT, ALBUMIN in the last 168 hours. No results for input(s): LIPASE, AMYLASE in the last 168 hours. No results for input(s): AMMONIA in the last 168 hours. Coagulation Profile: No results for input(s): INR, PROTIME in the last 168 hours. Cardiac Enzymes: No results for input(s): CKTOTAL, CKMB, CKMBINDEX, TROPONINI in the last 168 hours. BNP (last 3 results) No results for input(s): PROBNP in the last 8760 hours. HbA1C: No results for input(s): HGBA1C in the last 72 hours. CBG: No results for input(s): GLUCAP in the last 168 hours. Lipid Profile: No results for input(s): CHOL, HDL, LDLCALC, TRIG, CHOLHDL, LDLDIRECT in the last 72 hours. Thyroid Function Tests: No results for input(s): TSH, T4TOTAL, FREET4, T3FREE, THYROIDAB in the last 72 hours. Anemia Panel: No results for input(s): VITAMINB12, FOLATE, FERRITIN, TIBC, IRON, RETICCTPCT in the last 72 hours. Sepsis Labs: No results for input(s): PROCALCITON, LATICACIDVEN in the last 168 hours.  No results found for this or any previous visit (from the past 240 hour(s)).     Radiology Studies: No results  found.    Scheduled Meds: . carvedilol  6.25 mg Oral BID WC  . furosemide  80 mg Intravenous BID  . heparin subcutaneous  5,000 Units Subcutaneous Q8H  . lisinopril  40 mg Oral Daily  . potassium chloride  40 mEq Oral TID  . spironolactone  25 mg Oral Daily   Continuous Infusions:   LOS: 11 days    Time spent: 30 minutes   Noralee StainJennifer Randye Treichler, DO [facility] [email or web address] Password [email or web address] 04/01/2016, 12:01 PM

## 2016-04-01 NOTE — Progress Notes (Signed)
Progress Note  Patient Name: [name] Date of Encounter: 04/01/2016  Primary Cardiologist: Dr. Ellyn Hack  Subjective   Reports significant improvement in his edema, but legs still feel very tight. Breathing at baseline. Now able to sleep flat at night.   Inpatient Medications    Scheduled Meds: . carvedilol  6.25 mg Oral BID WC  . furosemide  80 mg Intravenous BID  . heparin subcutaneous  5,000 Units Subcutaneous Q8H  . lisinopril  40 mg Oral Daily  . potassium chloride  40 mEq Oral TID  . spironolactone  25 mg Oral Daily   Continuous Infusions:  PRN Meds: acetaminophen **OR** acetaminophen, hydrALAZINE, HYDROcodone-acetaminophen, ondansetron **OR** ondansetron (ZOFRAN) IV   Vital Signs    Vitals:   03/31/16 0622 03/31/16 1327 03/31/16 2022 04/01/16 0401  BP: 128/82 (!) 132/59 131/84 135/75  Pulse: 82 86 87 90  Resp: _0 Temp: 98.3 F (36.8 C) 97.5 F (36.4 C) 98 F (36.7 C) 98.1 F (36.7 C)  TempSrc: Oral Oral Oral Oral  SpO2: 100% 100% 97% 100%  Weight: (!) 512 lb 6.4 oz (232.4 kg)   (!) 506 lb 14.4 oz (229.9 kg)  Height:        Intake/Output Summary (Last 24 hours) at 04/01/16 0945 Last data filed at 04/01/16 0401  Gross per 24 hour  Intake             1480 ml  Output             2950 ml  Net            -1470 ml   Filed Weights   03/30/16 0458 03/31/16 0622 04/01/16 0401  Weight: (!) 521 lb 3.2 oz (236.4 kg) (!) 512 lb 6.4 oz (232.4 kg) (!) 506 lb 14.4 oz (229.9 kg)    Telemetry    Not connected to telemetry.   ECG    No new tracings.   Physical Exam   GEN: Pleasant, morbidly obese African American male appearing in no acute distress.  Neck: Unable to assess JVD secondary to body habitus. No bruits appreciated.  Cardiac: RRR, no murmurs, rubs, or gallops.  Respiratory: Respirations even and unlabored. Clear to auscultation bilaterally. GI: Soft, nontender, non-distended.  MS:  No deformity. Chronic lymphedema with pitting edema  bilaterally.  Neuro:  AAOx3. Psych: Normal affect  Labs    Chemistry Recent Labs Lab 03/30/16 0552 03/31/16 0600 04/01/16 0459  NA 137 136 137  K 4.4 4.1 4.5  CL 100* 98* 101  CO2 _1 GLUCOSE 108* 109* 96  BUN _2 CREATININE 0.85 0.86 0.81  CALCIUM 9.0 8.9 8.9  GFRNONAA >60 >60 >60  GFRAA >60 >60 >60  ANIONGAP _3 Radiology    No results found.  Cardiac Studies   Echocardiogram: 03/21/2016 Impressions:  - Uninterpretable study, even after Definity contrast   administration.  Patient Profile     36 y.o. male w/ PMH of HTN, OSA, chronic venous insufficiency (s/p venous bypass), DM, morbid obesity, and lymphedema who was admitted to [facility] on 03/20/2016 with progressive swelling, SOB, and progressive weight gain (previously 520's in 12/2015).Volume overload felt due to compressive lymphedema vs. CHF.   Assessment & Plan    1. Severe volume overload - likely a combination of CHF (unknown sysolic or diastolic) and compressive lymphedema.2D echocardiogram uninterpretable even with definity and body habitus limits other noninvasive assessments of LVEF.  No recent episodes of chest pain.  - has experienced significant diuresis with a recorded net output of -44.8L thus far. Weight down from 631lbs on admission to 506 lbs today (net loss of 125 lbs). Creatinine stable at 0.81. Continue with IV Lasix 80mg BID. Reports his weight was in the 470's - 480's prior to experiencing significant volume overload.  - continue BB and ACE-I.   2. Essential HTN - BP at 131/59 - 135/84in the past 24 hours.  - continue Coreg 6.25mg BID and Lisinopril 40mg daily.    3. Morbid obesity - Life-threatening at this point. Continued f/u with bariatric surgery has been discussed. - met with Nutritionist this admission.   4. Sleep apnea  - continue CPAP at night.   5. Venous insufficiency/lymphedema with ulceration  - per admitting team.    Signed, Brittany M  Strader, PA  04/01/2016, 9:45 AM    

## 2016-04-01 NOTE — Progress Notes (Signed)
Physical Therapy Treatment Patient Details Name: Jaclynn Majordrian T Rack MRN: [id number] DOB: June 10, 1980 Today's Date: 04/01/2016    History of Present Illness 36 yo male admitted with acute respiratory failure. Hx of morbid obesity, HTN, chronic lymphedema, chronic wounds    PT Comments    Pt progressing well, incr tolerance to activity/incr gait distance today  Follow Up Recommendations  No PT follow up     Equipment Recommendations  None recommended by PT    Recommendations for Other Services       Precautions / Restrictions Precautions Precautions: Fall Restrictions Weight Bearing Restrictions: No    Mobility  Bed Mobility               General bed mobility comments: oob in recliner  Transfers Overall transfer level: Modified independent Equipment used: Rolling walker (2 wheeled);None Transfers: Sit to/from Stand Sit to Stand: Supervision;Modified independent (Device/Increase time)         General transfer comment: Pt uses momentum.   Ambulation/Gait Ambulation/Gait assistance: Supervision Ambulation Distance (Feet): 280 Feet Assistive device: Rolling walker (2 wheeled) Gait Pattern/deviations: Step-through pattern;Wide base of support;Trendelenburg     General Gait Details: pt carries RW at times but does not feel ready to amb without it   Stairs            Wheelchair Mobility    Modified Rankin (Stroke Patients Only)       Balance                                    Cognition Arousal/Alertness: Awake/alert Behavior During Therapy: WFL for tasks assessed/performed Overall Cognitive Status: Within Functional Limits for tasks assessed                      Exercises Total Joint Exercises Knee Flexion: AROM;Both;10 reps;Standing;Strengthening Standing Hip Extension: AROM;Strengthening;Both;10 reps;Standing General Exercises - Lower Extremity Hip ABduction/ADduction: AROM;Strengthening;Both;10 reps;Standing Hip  Flexion/Marching: AROM;Strengthening;Both;10 reps;Standing Mini-Sqauts: AROM;Both;Standing;5 reps (wall squats for support and improved posture)    General Comments        Pertinent Vitals/Pain Pain Assessment: No/denies pain    Home Living                      Prior Function            PT Goals (current goals can now be found in the care plan section) Acute Rehab PT Goals Patient Stated Goal: none stated PT Goal Formulation: With patient Time For Goal Achievement: 04/09/16 Potential to Achieve Goals: Good Progress towards PT goals: Progressing toward goals    Frequency    Min 2X/week      PT Plan Current plan remains appropriate    Co-evaluation             End of Session   Activity Tolerance: Patient tolerated treatment well Patient left: in chair;with call bell/phone within reach     Time: 1420-1436 PT Time Calculation (min) (ACUTE ONLY): 16 min  Charges:  $Gait Training: 8-22 mins                    G Codes:      Doshia Dalia 04/01/2016, 2:44 PM

## 2016-04-01 NOTE — Care Management Note (Signed)
Case Management Note  Patient Details  Name: [name] MRN: [id number] Date of Birth: [birth year]  Subjective/Objective:  JProvided patient w/resources for places to purchase weight scales over 500lbs-patient voiced understanding. [facility]mother already aware of wound care.  No further CM needs.               Action/Plan:d/c plan home w/HHC.   Expected Discharge Date:   (unknown)               Expected Discharge Plan:  Home w Home Health Services  In-House Referral:     Discharge planning Services  CM Consult  Post Acute Care Choice:    Choice offered to:  Patient  DME Arranged:    DME Agency:     HH Arranged:  RN HH Agency:  [facility]  Status of Service:  In process, will continue to follow  If discussed at [place] Length of Stay Meetings, dates discussed:    Additional Comments:  Lanier ClamMahabir, Sterling Mondo, RN 04/01/2016, 12:01 PM

## 2016-04-01 NOTE — Progress Notes (Signed)
Pt. Informed RN that he would like to speak with the MD about a PT consult today. He states that he feels his "Right Leg is out of place." Will make AM RN aware.

## 2016-04-01 NOTE — Progress Notes (Signed)
Pt has home CPAP and prefers self placement.  RT to monitor and assess as needed.  

## 2016-04-02 MED ORDER — PIPERACILLIN-TAZOBACTAM 3.375 G IVPB
3.3750 g | Freq: Three times a day (TID) | INTRAVENOUS | Status: DC
  Administered 2016-04-02 – 2016-04-05 (×10): 3.375 g via INTRAVENOUS
  Filled 2016-04-02 (×10): qty 50

## 2016-04-02 NOTE — Progress Notes (Signed)
Progress Note  Patient Name: [name] Date of Encounter: 04/02/2016  Primary Cardiologist: Dr. Ellyn Hack  Subjective   Breathing at baseline. Still with significant edema. No chest discomfort or palpitations.   Inpatient Medications    Scheduled Meds: . carvedilol  6.25 mg Oral BID WC  . furosemide  80 mg Intravenous BID  . heparin subcutaneous  5,000 Units Subcutaneous Q8H  . lisinopril  40 mg Oral Daily  . piperacillin-tazobactam (ZOSYN)  IV  3.375 g Intravenous Q8H  . potassium chloride  40 mEq Oral TID  . spironolactone  25 mg Oral Daily   Continuous Infusions:  PRN Meds: acetaminophen **OR** acetaminophen, hydrALAZINE, HYDROcodone-acetaminophen, ondansetron **OR** ondansetron (ZOFRAN) IV   Vital Signs    Vitals:   04/01/16 1309 04/01/16 2013 04/02/16 0606 04/02/16 0658  BP: 133/83 (!) 142/80 133/73   Pulse: 85 89 83   Resp: 20 19 20    Temp: 98.1 F (36.7 C) 98.1 F (36.7 C) 97.9 F (36.6 C)   TempSrc: Oral Oral Axillary   SpO2: 100% 100% 97%   Weight:    (!) 498 lb 12.8 oz (226.3 kg)  Height:        Intake/Output Summary (Last 24 hours) at 04/02/16 0948 Last data filed at 04/02/16 0723  Gross per 24 hour  Intake              960 ml  Output             4050 ml  Net            -3090 ml   Filed Weights   03/31/16 0622 04/01/16 0401 04/02/16 0658  Weight: (!) 512 lb 6.4 oz (232.4 kg) (!) 506 lb 14.4 oz (229.9 kg) (!) 498 lb 12.8 oz (226.3 kg)    Telemetry    Not connected to telemetry.  ECG    No new tracings.  Physical Exam   General: Pleasant morbidly obese African American male appearing in no acute distress. Head: Normocephalic, atraumatic.  Neck: Supple without bruits, JVD unable to be assessed secondary to body habitus Lungs:  Resp regular and unlabored, CTA without wheezing or rales. Heart: RRR, S1, S2, no S3, S4, or murmur; no rub. Abdomen: Soft, non-tender, non-distended with normoactive bowel sounds. No hepatomegaly. No  rebound/guarding. No obvious abdominal masses. Extremities: Chronic lower extremity edema. Distal pedal pulses are 2+ bilaterally. Neuro: Alert and oriented X 3. Moves all extremities spontaneously. Psych: Normal affect.  Labs    Chemistry Recent Labs Lab 03/30/16 0552 03/31/16 0600 04/01/16 0459  NA 137 136 137  K 4.4 4.1 4.5  CL 100* 98* 101  CO2 29 30 26   GLUCOSE 108* 109* 96  BUN 18 18 18   CREATININE 0.85 0.86 0.81  CALCIUM 9.0 8.9 8.9  GFRNONAA >60 >60 >60  GFRAA >60 >60 >60  ANIONGAP 8 8 10      HematologyNo results for input(s): WBC, RBC, HGB, HCT, MCV, MCH, MCHC, RDW, PLT in the last 168 hours.  Cardiac EnzymesNo results for input(s): TROPONINI in the last 168 hours. No results for input(s): TROPIPOC in the last 168 hours.   BNPNo results for input(s): BNP, PROBNP in the last 168 hours.   DDimer No results for input(s): DDIMER in the last 168 hours.   Radiology    No results found.  Cardiac Studies   Echocardiogram: 03/21/2016 Impressions:  - Uninterpretable study, even after Definity contrast administration.  Patient Profile     36 y.o. male  w/ PMH of HTN, OSA, chronic venous insufficiency (s/p venous bypass), DM, morbid obesity, and lymphedema who was admitted to [facility] on 03/20/2016 with progressive swelling, SOB, and progressive weight gain (previously 520's in 12/2015).Volume overload felt due to compressive lymphedema vs. CHF.   Assessment & Plan    1. Severe volume overload - likely a combination of CHF (unknown sysolic or diastolic) and compressive lymphedema.2D echocardiogram uninterpretable even with definity and body habitus limits other noninvasive assessments of LVEF. No recent episodes of chest pain.  - has experienced significant diuresis with a recorded net output of -47.6L thus far. Weight down from 631lbs on admission to 498 lbs today (net loss of 133 lbs). Creatinine stable at 0.81 on 1/22. Continue with IV Lasix 68m BID. Reports his  weight was in the 470's - 480's prior to experiencing significant volume overload. Patient's birthday is this Friday and he is anxious to be home. Can hopefully switch to PO Lasix within the next 1-2 days. - continue BB and ACE-I.   2. Essential HTN - BP at 133/73 - 142/83 in the past 24 hours.  - continue Coreg 6.258mBID and Lisinopril 4069maily.    3. Morbid obesity - Life-threatening at this point. Continued f/u with bariatric surgery has been discussed. - met with Nutritionist this admission.   4. Sleep apnea  - continue CPAP at night.   5. Venous insufficiency/lymphedema with ulceration - per admitting team  Signed, BriErma HeritagePA-C 9:48 AM 04/02/2016 Pager: [phone]

## 2016-04-02 NOTE — Progress Notes (Signed)
PROGRESS NOTE    STONE SPIRITO  ZOX:[id number] DOB: 1980/08/23 DOA: 03/20/2016 PCP: Lupe Carney, MD     Brief Narrative:  [name] is a 36 y.o.malewith past medical history of morbi obesity, hypertension, sleep apnea, chronic venous stasis on LE. He presented to [facility] with worsening shortness of breath at rest and with exertion over the past few days prior to admission. Patient stated he may have gained at least 70 pounds in the last 1 month prior to this presentation, but true baseline weight is unknown.   During his hospitalization, echocardiogram was obtained, but was unable to be adequately evaluated due to body habitus. Patient was diuresed with IV Lasix and spironolactone. Cardiology has been managing diuretic dosage. He has lost approximately 130 pounds and diuresed over 48 L.  Assessment & Plan:   Principal Problem:   Acute respiratory failure with hypoxia (HCC) Active Problems:   Venous stasis ulcer (HCC)   Morbid obesity (HCC)   Chronic acquired lymphedema   Type 2 diabetes mellitus (HCC)   SOB (shortness of breath)   Hypertensive urgency   Abdominal pain   OSA (obstructive sleep apnea)   Acute respiratory failure (HCC)   NSVT (nonsustained ventricular tachycardia) (HCC)   Hypokalemia   Generalized edema   Acute on chronic congestive heart failure (HCC)   Volume overload - Unclear if due to CHF or compressive lymphedema  - Unable to adequately get echo results due to pt body habitus. Unable to do any further noninvasive testing due to body habitus. - Continues to diurese well  - Continue IV lasix, spironolactone - Appreciate cardiology, awaiting recommendations for PO dosage   Essential hypertension - Continue lisinopril, coreg 3.125 mg BID  Venous insufficiency/lymphedema with ulceration of greater than 1 year duration, POA - Evaluated by wound RN.  - Measurement: 9cm x 20cm area of involvement with 0.8cm depth at the deepest point. Dressing  procedure/placement/frequency: twice weekly (Tuesday/Friday) change schedule using a silver hydrofiber dressing as a wound contact layer and suggest home care be considered as a more viable option for routine dressing changes. [facility] for wound care teaching at discharge - Finished 7 day course of doxycycline during this hospitalization - Patient apparently received a phone call from his wound clinic at [facility] health care indicating that his wound culture from 1/3 showed MSSA and pseudomonas. This was resistant to Levaquin. Please see Care Everywhere for results. Patient was started on Zosyn on 1/23. He may require extended IV antibiotics depending on discharge timing   Morbid obesity due to excess calories  - Body mass index is 74.38 kg/m - Seen by nutritionist - Counseled on diet and nutrition, encourage weight loss, increase in activity and exercise  - Will need to be evaluated by bariatric surgery as outpatient   Sleep apnea - CPAP at night   DVT prophylaxis: heparin subq Code Status: Full Family Communication: no family at bedside Disposition Plan: pending further improvement in volume status. Will discharge to home when ready    Consultants:   Cardiology  Procedures:   Echo  Antimicrobials:   Doxycycline 1/10 - 1/17    Zosyn 1/23 >>   Subjective: No new complaints today. Continues to be without any respiratory complaints. Diuresing well.  Objective: Vitals:   04/01/16 2013 04/02/16 0606 04/02/16 0658 04/02/16 1205  BP: (!) 142/80 133/73  (!) 166/84  Pulse: 89 83  89  Resp: 19 20  20   Temp: 98.1 F (36.7 C) 97.9 F (36.6 C)  98.6 F (37 C)  TempSrc: Oral Axillary  Oral  SpO2: 100% 97%  94%  Weight:   (!) 226.3 kg (498 lb 12.8 oz)   Height:        Intake/Output Summary (Last 24 hours) at 04/02/16 1308 Last data filed at 04/02/16 1256  Gross per 24 hour  Intake             1250 ml  Output             2925 ml  Net            -1675 ml   Filed Weights    03/31/16 0622 04/01/16 0401 04/02/16 0658  Weight: (!) 232.4 kg (512 lb 6.4 oz) (!) 229.9 kg (506 lb 14.4 oz) (!) 226.3 kg (498 lb 12.8 oz)    Examination:  General exam: Appears calm and comfortable  Respiratory system: Clear to auscultation. Respiratory effort normal. Cardiovascular system: S1 & S2 heard, RRR. No JVD, murmurs, rubs, gallops or clicks. +edema in dependent regions, improving  Gastrointestinal system: Abdomen is nondistended, soft and nontender. No organomegaly or masses felt. Normal bowel sounds heard. Morbidly obese  Central nervous system: Alert and oriented. No focal neurological deficits. Extremities: Symmetric 5 x 5 power. Skin: +UNNA boot  Psychiatry: Judgement and insight appear normal. Mood & affect appropriate.   Data Reviewed: I have personally reviewed following labs and imaging studies  CBC: No results for input(s): WBC, NEUTROABS, HGB, HCT, MCV, PLT in the last 168 hours. Basic Metabolic Panel:  Recent Labs Lab 03/28/16 0614 03/29/16 0529 03/30/16 0552 03/31/16 0600 04/01/16 0459  NA 140 138 137 136 137  K 3.9 3.7 4.4 4.1 4.5  CL 99* 99* 100* 98* 101  CO2 33* 31 29 30 26   GLUCOSE 102* 108* 108* 109* 96  BUN 17 16 18 18 18   CREATININE 0.80 0.86 0.85 0.86 0.81  CALCIUM 8.9 8.9 9.0 8.9 8.9   GFR: Estimated Creatinine Clearance: 251.7 mL/min (by C-G formula based on SCr of 0.81 mg/dL). Liver Function Tests: No results for input(s): AST, ALT, ALKPHOS, BILITOT, PROT, ALBUMIN in the last 168 hours. No results for input(s): LIPASE, AMYLASE in the last 168 hours. No results for input(s): AMMONIA in the last 168 hours. Coagulation Profile: No results for input(s): INR, PROTIME in the last 168 hours. Cardiac Enzymes: No results for input(s): CKTOTAL, CKMB, CKMBINDEX, TROPONINI in the last 168 hours. BNP (last 3 results) No results for input(s): PROBNP in the last 8760 hours. HbA1C: No results for input(s): HGBA1C in the last 72 hours. CBG: No  results for input(s): GLUCAP in the last 168 hours. Lipid Profile: No results for input(s): CHOL, HDL, LDLCALC, TRIG, CHOLHDL, LDLDIRECT in the last 72 hours. Thyroid Function Tests: No results for input(s): TSH, T4TOTAL, FREET4, T3FREE, THYROIDAB in the last 72 hours. Anemia Panel: No results for input(s): VITAMINB12, FOLATE, FERRITIN, TIBC, IRON, RETICCTPCT in the last 72 hours. Sepsis Labs: No results for input(s): PROCALCITON, LATICACIDVEN in the last 168 hours.  No results found for this or any previous visit (from the past 240 hour(s)).     Radiology Studies: No results found.    Scheduled Meds: . carvedilol  6.25 mg Oral BID WC  . furosemide  80 mg Intravenous BID  . heparin subcutaneous  5,000 Units Subcutaneous Q8H  . lisinopril  40 mg Oral Daily  . piperacillin-tazobactam (ZOSYN)  IV  3.375 g Intravenous Q8H  . potassium chloride  40 mEq Oral TID  .  spironolactone  25 mg Oral Daily   Continuous Infusions:   LOS: 12 days    Time spent: 30 minutes   Noralee Stain, DO [facility] [email or web address] Password [email or web address] 04/02/2016, 1:08 PM

## 2016-04-03 DIAGNOSIS — E876 Hypokalemia: Secondary | ICD-10-CM

## 2016-04-03 DIAGNOSIS — J9600 Acute respiratory failure, unspecified whether with hypoxia or hypercapnia: Secondary | ICD-10-CM

## 2016-04-03 DIAGNOSIS — I83012 Varicose veins of right lower extremity with ulcer of calf: Secondary | ICD-10-CM

## 2016-04-03 DIAGNOSIS — L97212 Non-pressure chronic ulcer of right calf with fat layer exposed: Secondary | ICD-10-CM

## 2016-04-03 DIAGNOSIS — R1030 Lower abdominal pain, unspecified: Secondary | ICD-10-CM

## 2016-04-03 DIAGNOSIS — I472 Ventricular tachycardia: Secondary | ICD-10-CM

## 2016-04-03 LAB — BASIC METABOLIC PANEL
ANION GAP: 8 (ref 5–15)
BUN: 22 mg/dL — ABNORMAL HIGH (ref 6–20)
CHLORIDE: 100 mmol/L — AB (ref 101–111)
CO2: 28 mmol/L (ref 22–32)
Calcium: 9.1 mg/dL (ref 8.9–10.3)
Creatinine, Ser: 1.05 mg/dL (ref 0.61–1.24)
GFR calc Af Amer: >60 mL/min (ref >60)
Glucose, Bld: 109 mg/dL — ABNORMAL HIGH (ref 65–99)
POTASSIUM: 4.2 mmol/L (ref 3.5–5.1)
SODIUM: 136 mmol/L (ref 135–145)

## 2016-04-03 MED ORDER — CARVEDILOL 12.5 MG PO TABS
12.5000 mg | ORAL_TABLET | Freq: Two times a day (BID) | ORAL | Status: DC
  Administered 2016-04-03 – 2016-04-05 (×4): 12.5 mg via ORAL
  Filled 2016-04-03 (×4): qty 1

## 2016-04-03 MED ORDER — FUROSEMIDE 40 MG PO TABS
80.0000 mg | ORAL_TABLET | Freq: Two times a day (BID) | ORAL | Status: DC
  Administered 2016-04-03 – 2016-04-05 (×4): 80 mg via ORAL
  Filled 2016-04-03 (×4): qty 2

## 2016-04-03 NOTE — Progress Notes (Signed)
   04/03/16 1400  PT Visit Information  Last PT Received On 04/03/16  Reason Eval/Treat Not Completed Other (comment) (declined--just got back to bed)

## 2016-04-03 NOTE — Progress Notes (Signed)
PROGRESS NOTE    Shawn Majordrian T Kolenda  ZOX:[id number] DOB: [birth year] DOA: 03/20/2016 PCP: Lupe Carneyean Mitchell, MD    Brief Narrative:  [name] is a 36 y.o.malewith past medical history of morbi obesity, hypertension, sleep apnea, chronic venous stasis on LE. [facility] with worsening shortness of breath at rest and with exertion over the past few days prior to admission. Patient stated he may have gained at least 70 pounds in the last 1 month prior to this presentation, but true baseline weight is unknown.   During his hospitalization, echocardiogram was obtained, but was unable to be adequately evaluated due to body habitus. Patient was diuresed with IV Lasix and spironolactone. Cardiology has been managing diuretic dosage. He has lost approximately 130 pounds and diuresed over 48 L.   Assessment & Plan:   Principal Problem:   Acute respiratory failure with hypoxia (HCC) Active Problems:   Venous stasis ulcer (HCC)   Morbid obesity (HCC)   Chronic acquired lymphedema   Type 2 diabetes mellitus (HCC)   SOB (shortness of breath)   Hypertensive urgency   Abdominal pain   OSA (obstructive sleep apnea)   Acute respiratory failure (HCC)   NSVT (nonsustained ventricular tachycardia) (HCC)   Hypokalemia   Generalized edema   Acute on chronic congestive heart failure (HCC)   Volume overload - Unclear if due to CHF or compressive lymphedema  - Unable to adequately get echo results due to pt body habitus. Unable to do any further noninvasive testing due to body habitus. - Continues to diurese well  - Continue lasix, spironolactone but will switch to PO - Appreciate cardiology, awaiting recommendations for PO dosage  - per note from today patient may be able to discharge as early as tomorrow  Essential hypertension - Continue lisinopril - increase coreg to 12.5mg  BID  Venous insufficiency/lymphedema with ulceration of greater than 1 year duration, POA - Evaluated by wound  RN.  - Measurement: 9cm x 20cm area of involvement with 0.8cm depth at the deepest point. Dressing procedure/placement/frequency: twice weekly (Tuesday/Friday) change schedule using a silver hydrofiber dressing as a wound contact layer and suggest home care be considered as a more viable option for routine dressing changes. [facility]- [facility] for wound care teaching at discharge - Finished 7 day course of doxycycline during this hospitalization - Patient apparently received a [facility] call [facility] indicating that his wound culture from 1/3 showed MSSA and pseudomonas. This was resistant to Levaquin. Please see Care Everywhere for results. Patient was started on Zosyn on 1/23. He may require extended IV antibiotics depending on discharge timing  - will discuss with patient tomorrow  Morbid obesity due to excess calories  - Body mass index is 74.38 kg/m - Seen by nutritionist - Counseled on diet and nutrition, encourage weight loss, increase in activity and exercise  - Will need to be evaluated by bariatric surgery as outpatient  - patient states he is planning on follow up with bariatric clinic in the next week or two  Sleep apnea - CPAP at night   DVT prophylaxis: heparin subq Code Status: Full Family Communication: mother is bedside Disposition Plan: pending further improvement in volume status. Will discharge to home when ready possibly as early as tomorrow   Consultants:   Cardiology  Procedures:   Echo  Antimicrobials:   Doxycycline 1/10 - 1/17    Zosyn 1/23 >>   Subjective: Patient seen and evaluated.  Says he slept  well but isn't sleeping at night.  Reports his breathing is improved and that he is feeling better each day.  Discussed follow up with bariatric surgeon and patient states he is planning on going to bariatric clinic in [facility].  Objective: Vitals:   04/02/16 0658 04/02/16 1205 04/02/16 2100 04/03/16 0550  BP:  (!) 166/84  (!) 141/80 129/67  Pulse:  89 85 88  Resp:  20 20 18   Temp:  98.6 F (37 C) 98.4 F (36.9 C) 98 F (36.7 C)  TempSrc:  Oral Oral Oral  SpO2:  94% 92% 98%  Weight: (!) 226.3 kg (498 lb 12.8 oz)   (!) 223.8 kg (493 lb 4.8 oz)  Height:        Intake/Output Summary (Last 24 hours) at 04/03/16 1639 Last data filed at 04/03/16 1414  Gross per 24 hour  Intake             1080 ml  Output             2800 ml  Net            -1720 ml   Filed Weights   04/01/16 0401 04/02/16 0658 04/03/16 0550  Weight: (!) 229.9 kg (506 lb 14.4 oz) (!) 226.3 kg (498 lb 12.8 oz) (!) 223.8 kg (493 lb 4.8 oz)    Examination:  General exam: Appears calm and comfortable  Respiratory system: Clear to auscultation. Respiratory effort normal. Cardiovascular system: S1 & S2 heard, RRR. No JVD, murmurs, rubs, gallops or clicks. No pedal edema. Gastrointestinal system: Abdomen is obese, soft and nontender except for fatty nodule on the right flank region that patient states has been present for some time. No organomegaly or masses felt. Normal bowel sounds heard. Central nervous system: Alert and oriented. No focal neurological deficits. Extremities: Symmetric 5 x 5 power. Skin: skin thickening, right LE with intact dressing Psychiatry: Judgement and insight appear normal. Mood & affect appropriate.     Data Reviewed: I have personally reviewed following labs and imaging studies  CBC: No results for input(s): WBC, NEUTROABS, HGB, HCT, MCV, PLT in the last 168 hours. Basic Metabolic Panel:  Recent Labs Lab 03/29/16 0529 03/30/16 0552 03/31/16 0600 04/01/16 0459 04/03/16 0500  NA 138 137 136 137 136  K 3.7 4.4 4.1 4.5 4.2  CL 99* 100* 98* 101 100*  CO2 31 29 30 26 28   GLUCOSE 108* 108* 109* 96 109*  BUN 16 18 18 18  22*  CREATININE 0.86 0.85 0.86 0.81 1.05  CALCIUM 8.9 9.0 8.9 8.9 9.1   GFR: Estimated Creatinine Clearance: 192.8 mL/min (by C-G formula based on SCr of 1.05 mg/dL). Liver Function  Tests: No results for input(s): AST, ALT, ALKPHOS, BILITOT, PROT, ALBUMIN in the last 168 hours. No results for input(s): LIPASE, AMYLASE in the last 168 hours. No results for input(s): AMMONIA in the last 168 hours. Coagulation Profile: No results for input(s): INR, PROTIME in the last 168 hours. Cardiac Enzymes: No results for input(s): CKTOTAL, CKMB, CKMBINDEX, TROPONINI in the last 168 hours. BNP (last 3 results) No results for input(s): PROBNP in the last 8760 hours. HbA1C: No results for input(s): HGBA1C in the last 72 hours. CBG: No results for input(s): GLUCAP in the last 168 hours. Lipid Profile: No results for input(s): CHOL, HDL, LDLCALC, TRIG, CHOLHDL, LDLDIRECT in the last 72 hours. Thyroid Function Tests: No results for input(s): TSH, T4TOTAL, FREET4, T3FREE, THYROIDAB in the last 72 hours. Anemia Panel: No results  for input(s): VITAMINB12, FOLATE, FERRITIN, TIBC, IRON, RETICCTPCT in the last 72 hours. Sepsis Labs: No results for input(s): PROCALCITON, LATICACIDVEN in the last 168 hours.  No results found for this or any previous visit (from the past 240 hour(s)).       Radiology Studies: No results found.      Scheduled Meds: . carvedilol  12.5 mg Oral BID WC  . furosemide  80 mg Oral BID  . heparin subcutaneous  5,000 Units Subcutaneous Q8H  . lisinopril  40 mg Oral Daily  . piperacillin-tazobactam (ZOSYN)  IV  3.375 g Intravenous Q8H  . potassium chloride  40 mEq Oral TID  . spironolactone  25 mg Oral Daily   Continuous Infusions:   LOS: 13 days    Time spent: 30 minutes    Katrinka Blazing, MD [facility] Pager [phone]  If 7PM-7AM, please contact night-coverage [email or web address] Password [email or web address] 04/03/2016, 4:39 PM

## 2016-04-03 NOTE — Progress Notes (Signed)
Progress Note  Patient Name: [name] Date of Encounter: 04/03/2016  Primary Cardiologist: Dr. Ellyn Hack   Subjective   No complaints of chest pain or SOB, feels much better  Inpatient Medications    Scheduled Meds: . carvedilol  6.25 mg Oral BID WC  . furosemide  80 mg Oral BID  . heparin subcutaneous  5,000 Units Subcutaneous Q8H  . lisinopril  40 mg Oral Daily  . piperacillin-tazobactam (ZOSYN)  IV  3.375 g Intravenous Q8H  . potassium chloride  40 mEq Oral TID  . spironolactone  25 mg Oral Daily   Continuous Infusions:  PRN Meds: acetaminophen **OR** acetaminophen, hydrALAZINE, HYDROcodone-acetaminophen, ondansetron **OR** ondansetron (ZOFRAN) IV   Vital Signs    Vitals:   04/02/16 0658 04/02/16 1205 04/02/16 2100 04/03/16 0550  BP:  (!) 166/84 (!) 141/80 129/67  Pulse:  89 85 88  Resp:  _0 Temp:  98.6 F (37 C) 98.4 F (36.9 C) 98 F (36.7 C)  TempSrc:  Oral Oral Oral  SpO2:  94% 92% 98%  Weight: (!) 498 lb 12.8 oz (226.3 kg)   (!) 493 lb 4.8 oz (223.8 kg)  Height:        Intake/Output Summary (Last 24 hours) at 04/03/16 1231 Last data filed at 04/03/16 0928  Gross per 24 hour  Intake              750 ml  Output             2700 ml  Net            -1950 ml   Filed Weights   04/01/16 0401 04/02/16 0658 04/03/16 0550  Weight: (!) 506 lb 14.4 oz (229.9 kg) (!) 498 lb 12.8 oz (226.3 kg) (!) 493 lb 4.8 oz (223.8 kg)    Telemetry    Not on tele  ECG    last one on 03/20/16 - Personally Reviewed  Physical Exam   GEN: No acute distress.  Neck: No JVD Cardiac: RRR, no murmurs, rubs, or gallops.  Respiratory: Clear to auscultation bilaterally. GI: obese,Soft, nontender, non-distended  MS: tr to 1+ edema R>L and dressing on Rt lower ext but greatly improved.; No deformity. Neuro:  AAOx3.MAE follow commands Psych: Normal affect  Labs    Chemistry Recent Labs Lab 03/31/16 0600 04/01/16 0459 04/03/16 0500  NA 136 137 136  K 4.1  4.5 4.2  CL 98* 101 100*  CO2 _1 GLUCOSE 109* 96 109*  BUN 18 18 22*  CREATININE 0.86 0.81 1.05  CALCIUM 8.9 8.9 9.1  GFRNONAA >60 >60 >60  GFRAA >60 >60 >60  ANIONGAP _2 HematologyNo results for input(s): WBC, RBC, HGB, HCT, MCV, MCH, MCHC, RDW, PLT in the last 168 hours.  Cardiac EnzymesNo results for input(s): TROPONINI in the last 168 hours. No results for input(s): TROPIPOC in the last 168 hours.   BNPNo results for input(s): BNP, PROBNP in the last 168 hours.   DDimer No results for input(s): DDIMER in the last 168 hours.   Radiology    No results found.  Cardiac Studies   Echocardiogram: 03/21/2016 Impressions:  - Uninterpretable study, even after Definity contrast administration  Patient Profile     36 y.o. male w/ PMH of HTN, OSA, chronic venous insufficiency (s/p venous bypass), DM, morbid obesity, and lymphedema who was admitted to [facility] on 03/20/2016 with progressive swelling, SOB, and progressive weight gain (previously  520's in 12/2015).Volume overload felt due to compressive lymphedema vs. CHF  Assessment & Plan    1. Severe volume overload - likely a combination ofCHF (unknown sysolic or diastolic) and compressive lymphedema.2D echocardiogram uninterpretable even with definity and body habitus limits other noninvasive assessments of LVEF. No recent episodes of chest pain.  - has experienced significant diuresis with a recorded net output of -33,8L thus far. Weight down from 631lbs on admission to 493 lbs today (net loss of 138 lbs). Creatinine stable at 0.81 on 1/22.now have placed on PO lasix  Reports his weight was in the 470's - 480's prior to experiencing significant volume overload. Patient's birthday is this Friday and he is anxious to be home.  - continue BB and ACE-I.  2. Essential HTN - BP at 166/84 - 129/67 in the past 24 hours.  - continue Coreg 6.11m BID and Lisinopril 434mdaily.   3. Morbid obesity -  Life-threatening at this point. Continued f/u with bariatric surgery has been discussed. - met with Nutritionist this admission.   4. Sleep apnea  - continue CPAP at night.   5. Venous insufficiency/lymphedema with ulceration - per admitting team does go to wound center  Signed, LaCecilie KicksNP  04/03/2016, 12:31 PM

## 2016-04-04 DIAGNOSIS — E11622 Type 2 diabetes mellitus with other skin ulcer: Secondary | ICD-10-CM

## 2016-04-04 DIAGNOSIS — I5023 Acute on chronic systolic (congestive) heart failure: Secondary | ICD-10-CM

## 2016-04-04 DIAGNOSIS — L98499 Non-pressure chronic ulcer of skin of other sites with unspecified severity: Secondary | ICD-10-CM

## 2016-04-04 MED ORDER — SODIUM CHLORIDE 0.9% FLUSH: 10.0000 mL | INTRAVENOUS | Status: DC | PRN

## 2016-04-04 NOTE — Procedures (Signed)
Patient has home CPAP beside and ready for use.  He stated he would place on himself when ready for bed and did not need any assistance.

## 2016-04-04 NOTE — Progress Notes (Signed)
PROGRESS NOTE    [name]  ZOX:[id number] DOB: 1981/01/11 DOA: 03/20/2016 PCP: Lupe Carney, MD    Brief Narrative:  [name] is a 36 y.o.malewith past medical history of morbi obesity, hypertension, sleep apnea, chronic venous stasis on LE. He presented to [facility] with worsening shortness of breath at rest and with exertion over the past few days prior to admission. Patient stated he may have gained at least 70 pounds in the last 1 month prior to this presentation, but true baseline weight is unknown.   During his hospitalization, echocardiogram was obtained, but was unable to be adequately evaluated due to body habitus. Patient was diuresed with IV Lasix and spironolactone. Cardiology has been managing diuretic dosage. He has lost approximately 130 pounds and diuresed over 48 L.  Patient was previously found (before hospitalization) to test positive for MSSA and Pseudomonas Aeruginosa in right lower extremity wound.  Was started on IV Zosyn.  Discussed on phone with ID and will need 3 weeks of IV antibiotics.   Assessment & Plan:   Principal Problem:   Acute respiratory failure with hypoxia (HCC) Active Problems:   Venous stasis ulcer (HCC)   Morbid obesity (HCC)   Chronic acquired lymphedema   Type 2 diabetes mellitus (HCC)   SOB (shortness of breath)   Hypertensive urgency   Abdominal pain   OSA (obstructive sleep apnea)   Acute respiratory failure (HCC)   NSVT (nonsustained ventricular tachycardia) (HCC)   Hypokalemia   Generalized edema   Acute on chronic congestive heart failure (HCC)   Volume overload - Unclear if due to CHF or compressive lymphedema  - Unable to adequately get echo results due to pt body habitus. Unable to do any further noninvasive testing due to body habitus. - Continues to diurese well  - Continue lasix, spironolactone but will switch to PO - Appreciate cardiology, awaiting recommendations for PO dosage  - need follow up with cardiology in  1 week after discharge - continue current PO medications  Essential hypertension - Continue lisinopril 40mg  daily - increase coreg to 12.5mg  BID  Venous insufficiency/lymphedema with ulceration of greater than 1 year duration, POA - Evaluated by wound RN.  - Measurement: 9cm x 20cm area of involvement with 0.8cm depth at the deepest point. Dressing procedure/placement/frequency: twice weekly (Tuesday/Friday) change schedule using a silver hydrofiber dressing as a wound contact layer and suggest home care be considered as a more viable option for routine dressing changes. [facility] for wound care teaching at discharge - will follow up with wound care center after discharge - Finished 7 day course of doxycycline during this hospitalization - Patient apparently received a phone call from his wound clinic at [facility] health care indicating that his wound culture from 1/3 showed MSSA and pseudomonas. This was resistant to Levaquin. Please see Care Everywhere for results. Patient was started on Zosyn on 1/23. - will require 3 weeks total of IV Zosyn - PICC line placed today  Morbid obesity due to excess calories  - Body mass index is 74.38 kg/m - Seen by nutritionist - Counseled on diet and nutrition, encourage weight loss, increase in activity and exercise  - Will need to be evaluated by bariatric surgery as outpatient  - patient states he is planning on follow up with bariatric clinic in the next week or two  Sleep apnea - CPAP at night   DVT prophylaxis: heparin subq Code Status: Full Family Communication: mother is bedside Disposition Plan: pending further improvement  in volume status. Will discharge to home likely tomorrow after IV antibiotics have been set up and teaching on how IV antibiotics can be administered at home done   Consultants:   Cardiology  Wound Care  Procedures:   Echo  Antimicrobials:   Doxycycline 1/10 - 1/17    Zosyn 1/23  >>   Subjective: Patient seen this am during bedside rounds.  Reports he feels well.  Plans to follow up with wound care center outpatient.  Discussed PICC line placement with patient and his mother.    Objective: Vitals:   04/03/16 0550 04/03/16 2137 04/04/16 0444 04/04/16 1228  BP: 129/67 118/71 112/77 118/67  Pulse: 88 89 90 (!) 102  Resp: 18 19 19 18   Temp: 98 F (36.7 C) 97.5 F (36.4 C) 98.9 F (37.2 C) 97.6 F (36.4 C)  TempSrc: Oral Oral Oral Oral  SpO2: 98% 100% 99% 100%  Weight: (!) 223.8 kg (493 lb 4.8 oz)  (!) 221.6 kg (488 lb 9.6 oz)   Height:        Intake/Output Summary (Last 24 hours) at 04/04/16 1524 Last data filed at 04/04/16 1100  Gross per 24 hour  Intake              820 ml  Output             1900 ml  Net            -1080 ml   Filed Weights   04/02/16 0658 04/03/16 0550 04/04/16 0444  Weight: (!) 226.3 kg (498 lb 12.8 oz) (!) 223.8 kg (493 lb 4.8 oz) (!) 221.6 kg (488 lb 9.6 oz)    Examination:  General exam: Appears calm and comfortable  Respiratory system: Clear to auscultation. Respiratory effort normal. Cardiovascular system: S1 & S2 heard, RRR. No JVD, murmurs, rubs, gallops or clicks. No pedal edema. Gastrointestinal system: Abdomen is obese, soft and nontender except for fatty nodule on the right flank region that patient states has been present for some time. No organomegaly or masses felt. Normal bowel sounds heard. Central nervous system: Alert and oriented. No focal neurological deficits. Extremities: Symmetric 5 x 5 power. Right lower leg dressing with some drainage noted on dressing Skin: skin thickening, right LE with intact dressing Psychiatry: Judgement and insight appear normal. Mood & affect appropriate.     Data Reviewed: I have personally reviewed following labs and imaging studies  CBC: No results for input(s): WBC, NEUTROABS, HGB, HCT, MCV, PLT in the last 168 hours. Basic Metabolic Panel:  Recent Labs Lab  03/29/16 0529 03/30/16 0552 03/31/16 0600 04/01/16 0459 04/03/16 0500  NA 138 137 136 137 136  K 3.7 4.4 4.1 4.5 4.2  CL 99* 100* 98* 101 100*  CO2 31 29 30 26 28   GLUCOSE 108* 108* 109* 96 109*  BUN 16 18 18 18  22*  CREATININE 0.86 0.85 0.86 0.81 1.05  CALCIUM 8.9 9.0 8.9 8.9 9.1   GFR: Estimated Creatinine Clearance: 191.7 mL/min (by C-G formula based on SCr of 1.05 mg/dL). Liver Function Tests: No results for input(s): AST, ALT, ALKPHOS, BILITOT, PROT, ALBUMIN in the last 168 hours. No results for input(s): LIPASE, AMYLASE in the last 168 hours. No results for input(s): AMMONIA in the last 168 hours. Coagulation Profile: No results for input(s): INR, PROTIME in the last 168 hours. Cardiac Enzymes: No results for input(s): CKTOTAL, CKMB, CKMBINDEX, TROPONINI in the last 168 hours. BNP (last 3 results) No results for input(s): PROBNP  in the last 8760 hours. HbA1C: No results for input(s): HGBA1C in the last 72 hours. CBG: No results for input(s): GLUCAP in the last 168 hours. Lipid Profile: No results for input(s): CHOL, HDL, LDLCALC, TRIG, CHOLHDL, LDLDIRECT in the last 72 hours. Thyroid Function Tests: No results for input(s): TSH, T4TOTAL, FREET4, T3FREE, THYROIDAB in the last 72 hours. Anemia Panel: No results for input(s): VITAMINB12, FOLATE, FERRITIN, TIBC, IRON, RETICCTPCT in the last 72 hours. Sepsis Labs: No results for input(s): PROCALCITON, LATICACIDVEN in the last 168 hours.  No results found for this or any previous visit (from the past 240 hour(s)).       Radiology Studies: No results found.      Scheduled Meds: . carvedilol  12.5 mg Oral BID WC  . furosemide  80 mg Oral BID  . heparin subcutaneous  5,000 Units Subcutaneous Q8H  . lisinopril  40 mg Oral Daily  . piperacillin-tazobactam (ZOSYN)  IV  3.375 g Intravenous Q8H  . potassium chloride  40 mEq Oral TID  . spironolactone  25 mg Oral Daily   Continuous Infusions:   LOS: 14 days     Time spent: 30 minutes    Katrinka BlazingAlex U Kadolph, MD [facility] Pager [phone]  If 7PM-7AM, please contact night-coverage [email or web address] Password [email or web address] 04/04/2016, 3:24 PM

## 2016-04-04 NOTE — Progress Notes (Signed)
Progress Note  Patient Name: [name] Date of Encounter: 04/04/2016  Primary Cardiologist: Dr. Ellyn Hack   Subjective   No complaints  Inpatient Medications    Scheduled Meds: . carvedilol  12.5 mg Oral BID WC  . furosemide  80 mg Oral BID  . heparin subcutaneous  5,000 Units Subcutaneous Q8H  . lisinopril  40 mg Oral Daily  . piperacillin-tazobactam (ZOSYN)  IV  3.375 g Intravenous Q8H  . potassium chloride  40 mEq Oral TID  . spironolactone  25 mg Oral Daily   Continuous Infusions:  PRN Meds: acetaminophen **OR** acetaminophen, hydrALAZINE, HYDROcodone-acetaminophen, ondansetron **OR** ondansetron (ZOFRAN) IV   Vital Signs    Vitals:   04/03/16 0550 04/03/16 2137 04/04/16 0444 04/04/16 1228  BP: 129/67 118/71 112/77 118/67  Pulse: 88 89 90 (!) 102  Resp: 18 19 19 18   Temp: 98 F (36.7 C) 97.5 F (36.4 C) 98.9 F (37.2 C) 97.6 F (36.4 C)  TempSrc: Oral Oral Oral Oral  SpO2: 98% 100% 99% 100%  Weight: (!) 493 lb 4.8 oz (223.8 kg)  (!) 488 lb 9.6 oz (221.6 kg)   Height:        Intake/Output Summary (Last 24 hours) at 04/04/16 1241 Last data filed at 04/04/16 1100  Gross per 24 hour  Intake              940 ml  Output             2300 ml  Net            -1360 ml   Filed Weights   04/02/16 0658 04/03/16 0550 04/04/16 0444  Weight: (!) 498 lb 12.8 oz (226.3 kg) (!) 493 lb 4.8 oz (223.8 kg) (!) 488 lb 9.6 oz (221.6 kg)    Telemetry    No tele - Personally Reviewed  ECG    No new - Personally Reviewed  Physical Exam   GEN: No acute distress.  Neck: No JVD Cardiac: RRR, distant heart sounds, no murmurs, rubs, or gallops.  Respiratory: Clear to auscultation bilaterally. GI: Soft, nontender, non-distended  MS: + edema at rt knee but otherwise much improved; No deformity. Neuro:  AAOx3. Psych: Normal affect  Labs    Chemistry  Recent Labs Lab 03/31/16 0600 04/01/16 0459 04/03/16 0500  NA 136 137 136  K 4.1 4.5 4.2  CL 98* 101 100*    CO2 30 26 28   GLUCOSE 109* 96 109*  BUN 18 18 22*  CREATININE 0.86 0.81 1.05  CALCIUM 8.9 8.9 9.1  GFRNONAA >60 >60 >60  GFRAA >60 >60 >60  ANIONGAP 8 10 8      HematologyNo results for input(s): WBC, RBC, HGB, HCT, MCV, MCH, MCHC, RDW, PLT in the last 168 hours.  Cardiac EnzymesNo results for input(s): TROPONINI in the last 168 hours. No results for input(s): TROPIPOC in the last 168 hours.   BNPNo results for input(s): BNP, PROBNP in the last 168 hours.   DDimer No results for input(s): DDIMER in the last 168 hours.   Radiology    No results found.  Cardiac Studies    Echocardiogram: 03/21/2016 Impressions:  - Uninterpretable study, even after Definity contrast administration  Patient Profile     36 y.o. male w/ PMH of HTN, OSA, chronic venous insufficiency (s/p venous bypass), DM, morbid obesity, and lymphedema who was admitted to [facility] on 03/20/2016 with progressive swelling, SOB, and progressive weight gain (previously 520's in 12/2015).Volume overload felt due to  compressive lymphedema vs. CHF   Assessment & Plan    1. Severe volume overload - likely a combination ofCHF (unknown sysolic or diastolic) and compressive lymphedema.2D echocardiogram uninterpretable even with definity and body habitus limits other noninvasive assessments of LVEF. No recent episodes of chest pain.  - has experienced significant diuresis with a recorded net output of -27.3L thus far. Weight down from 631lbs on admission to 488lbs today (net loss of 143lbs). Creatinine stable at 0.81-->today 1.05now have placed on PO lasix  Reports his weight was in the 470's - 480's prior to experiencing significant volume overload. Patient's [birth year] is this [birth year] and he is anxious to be home.  - continue BB increased dose yesterday and ACE-I. - needs close follow up 5-7 days and BMP  2. Essential HTN - BP improved with increased BB to 118/67 to 112/77  in the past 24 hours.  - continue Coreg  6.31m BID and Lisinopril 440mdaily.   3. Morbid obesity - Life-threatening at this point. Continued f/u with bariatric surgery has been discussed. - met with Nutritionist this admission.   4. Sleep apnea  - continue CPAP at night.   5. Venous insufficiency/lymphedema with ulceration - per admitting team does go to wound center  Signed, LaCecilie KicksNP  04/04/2016, 12:41 PM

## 2016-04-04 NOTE — Discharge Instructions (Signed)
Weigh daily Call [phone] ( Dr. Leonides Sakeandolph's office) if weight climbs more than 3 pounds in a day or 5 pounds in a week. No salt to very little salt in your diet.  No more than 2000 mg in a day. Call if increased shortness of breath or increased swelling. Stay on fluid restriction of 1500 cc for now

## 2016-04-04 NOTE — Progress Notes (Signed)
Peripherally Inserted Central Catheter/Midline Placement  The IV Nurse has discussed with the patient and/or persons authorized to consent for the patient, the purpose of this procedure and the potential benefits and risks involved with this procedure.  The benefits include less needle sticks, lab draws from the catheter, and the patient may be discharged home with the catheter. Risks include, but not limited to, infection, bleeding, blood clot (thrombus formation), and puncture of an artery; nerve damage and irregular heartbeat and possibility to perform a PICC exchange if needed/ordered by physician.  Alternatives to this procedure were also discussed.  Bard Power PICC patient education guide, fact sheet on infection prevention and patient information card has been provided to patient /or left at bedside.   Consent obtained by Lazarus Gowdaenee Newman, RN  PICC/Midline Placement Documentation        [name], [name] 04/04/2016, 3:17 PM

## 2016-04-04 NOTE — Progress Notes (Signed)
[facility]  [facility] is following pt and is prepared to support pt at DC for [facility] services including home IV ABX.  [facility] hospital team will continue to follow until DC to support transition home and ensure [facility] needs are met as ordered.  If patient discharges after hours, please call [phone].   Larry Sierras 04/04/2016, 5:33 PM

## 2016-04-04 NOTE — Care Management Note (Signed)
Case Management Note  Patient Details  Name: [name] MRN: [id number] Date of Birth: [birth year]  Subjective/Objective:  36 y.o. M continues in hospital after  admission 03/20/2016 with progressive swelling of extremities, SOB and progressive Wt gain. Pt has already chosen [facility] to assist with wound care of LLE which [facility]. (Tues/Friday Change Schedule with Silver Hydrofiber dressing.)  Will have HHRN for Wound Care Teaching. Made Jeri ModenaPam Chandler, IV Infusion specialist aware of his need for Home IV abx x 10-14d for new cx results (MSSA). Awaiting PICC placement.                  Action/Plan: [facility] following in anticipation of Friday discharge.    Expected Discharge Date:   (unknown)               Expected Discharge Plan:  Home w Home Health Services  In-House Referral:  NA  Discharge planning Services  CM Consult  Post Acute Care Choice:  Home Health Choice offered to:  Patient, Parent (Mother at bedside agreed to assist as needed)  DME Arranged:  IV pump/equipment DME Agency:  [facility]  HH Arranged:  RN, IV Antibiotics HH Agency:  [facility]  Status of Service:  In process, will continue to follow  If discussed at [place] Length of Stay Meetings, dates discussed:    Additional Comments:  Yvone NeuCrutchfield, Marylen Zuk M, RN 04/04/2016, 12:56 PM

## 2016-04-04 NOTE — Progress Notes (Signed)
PT Cancellation Note  Patient Details Name: Jaclynn Majordrian T Jalloh MRN: [id number] DOB: 21-May-1980   Cancelled Treatment:    Reason Eval/Treat Not Completed: Patient at procedure or test/unavailable. Pt having PICC line placed. Will check back another time.    Rebeca AlertJannie Quirino Kakos, MPT Pager: [phone]

## 2016-04-05 ENCOUNTER — Telehealth: Payer: Self-pay

## 2016-04-05 ENCOUNTER — Telehealth: Payer: Self-pay | Admitting: Student

## 2016-04-05 DIAGNOSIS — Z79899 Other long term (current) drug therapy: Secondary | ICD-10-CM

## 2016-04-05 DIAGNOSIS — G4733 Obstructive sleep apnea (adult) (pediatric): Secondary | ICD-10-CM | POA: Diagnosis not present

## 2016-04-05 DIAGNOSIS — J9601 Acute respiratory failure with hypoxia: Secondary | ICD-10-CM | POA: Diagnosis not present

## 2016-04-05 DIAGNOSIS — Z79891 Long term (current) use of opiate analgesic: Secondary | ICD-10-CM | POA: Diagnosis not present

## 2016-04-05 DIAGNOSIS — I872 Venous insufficiency (chronic) (peripheral): Secondary | ICD-10-CM | POA: Diagnosis not present

## 2016-04-05 DIAGNOSIS — E119 Type 2 diabetes mellitus without complications: Secondary | ICD-10-CM | POA: Diagnosis not present

## 2016-04-05 LAB — BASIC METABOLIC PANEL
Anion gap: 8 (ref 5–15)
BUN: 25 mg/dL — AB (ref 6–20)
CHLORIDE: 102 mmol/L (ref 101–111)
CO2: 29 mmol/L (ref 22–32)
Calcium: 9.4 mg/dL (ref 8.9–10.3)
Creatinine, Ser: 1.15 mg/dL (ref 0.61–1.24)
GFR calc Af Amer: >60 mL/min (ref >60)
GFR calc non Af Amer: >60 mL/min (ref >60)
GLUCOSE: 112 mg/dL — AB (ref 65–99)
POTASSIUM: 4.3 mmol/L (ref 3.5–5.1)
Sodium: 139 mmol/L (ref 135–145)

## 2016-04-05 MED ORDER — FUROSEMIDE 80 MG PO TABS: 80.0000 mg | ORAL_TABLET | Freq: Two times a day (BID) | ORAL | 0 refills | Status: DC

## 2016-04-05 MED ORDER — SPIRONOLACTONE 25 MG PO TABS: 25.0000 mg | ORAL_TABLET | Freq: Every day | ORAL | 0 refills | Status: DC

## 2016-04-05 MED ORDER — PIPERACILLIN-TAZOBACTAM 3.375 G IVPB: 3.3750 g | Freq: Three times a day (TID) | INTRAVENOUS | 0 refills | Status: AC

## 2016-04-05 MED ORDER — HYDROCODONE-ACETAMINOPHEN 5-325 MG PO TABS: 1.0000 | ORAL_TABLET | Freq: Four times a day (QID) | ORAL | 0 refills | Status: DC | PRN

## 2016-04-05 MED ORDER — POTASSIUM CHLORIDE CRYS ER 20 MEQ PO TBCR: 40.0000 meq | EXTENDED_RELEASE_TABLET | Freq: Three times a day (TID) | ORAL | 0 refills | Status: DC

## 2016-04-05 MED ORDER — HEPARIN SOD (PORK) LOCK FLUSH 100 UNIT/ML IV SOLN
250.0000 [IU] | INTRAVENOUS | Status: AC | PRN
  Administered 2016-04-05: 250 [IU]

## 2016-04-05 MED ORDER — LISINOPRIL 40 MG PO TABS: 40.0000 mg | ORAL_TABLET | Freq: Every day | ORAL | 0 refills | Status: DC

## 2016-04-05 NOTE — Progress Notes (Signed)
Patient Discharge education completed. Patient aware of appointments to follow up at wound center and cardiology. [facility] here to educate on home IV antibiotic and PICC care.

## 2016-04-05 NOTE — Progress Notes (Signed)
   Continues to experience diuresis with a recorded net output of -47.2L thus far. Weight down from 631lbs on admission to 488 lbs today (net loss of 143 lbs). Creatinine stable at 1.15 today.Has been switched to PO Lasix 80mg  BID.   Continue Coreg 12.5mg  BID and Lisinopril 40mg  daily. I have arranged for a 7-day TOC appointment at our office (information included in AVS).   Signed, Ellsworth LennoxBrittany M Cami Delawder, PA-C 04/05/2016, 10:02 AM Pager: [phone]

## 2016-04-05 NOTE — Care Management Note (Signed)
Case Management Note  Patient Details  Name: [name] MRN: [id number] Date of Birth: April 28, 1980  Subjective/Objective:    D/c home w/HHC-HHRN-iv abx,[facility] of [facility]/c & [facility]HC orders. No further CM needs.               Action/Plan:d/c home w/HHC.   Expected Discharge Date:  04/05/16               Expected Discharge Plan:  Home w Home Health Services  In-House Referral:  NA  Discharge planning Services  CM Consult  Post Acute Care Choice:  Home Health Choice offered to:  Patient, Parent (Mother at bedside agreed to assist as needed)  DME Arranged:  IV pump/equipment DME Agency:  [facility]  HH Arranged:  RN, IV Antibiotics HH Agency:  [facility]  Status of Service:  Completed, signed off  If discussed at [place] Length of Stay Meetings, dates discussed:    Additional Comments:  Lanier ClamMahabir, Tiffanye Hartmann, RN 04/05/2016, 12:13 PM

## 2016-04-05 NOTE — Discharge Summary (Signed)
Physician Discharge Summary  [name] ZOX:[id number] DOB: 1981/02/06 DOA: 03/20/2016  PCP: Lupe Carneyean Mitchell, MD  Admit date: 03/20/2016 Discharge date: 04/05/2016  Admitted From: Home Disposition:  Home   Recommendations for Outpatient Follow-up:  1. Follow up with PCP in 1-2 weeks 2. Wound care center follow up 3. Continue IV antibiotics for 18 more days 4. Please follow up with cardiology as previously discussed 5. Please obtain BMP/CBC in one week   Home Health: Yes- PT/RN/Aide Equipment/Devices: PICC line placed on 04/04/16  Discharge Condition: Stable CODE STATUS:Full Code Diet recommendation: Heart Healthy / Carb Modified   Brief/Interim Summary: [name] a 36 y.o.malewith past medical history of morbi obesity, hypertension, sleep apnea, chronic venous stasis on LE. [facility] with worsening shortness of breath at rest and with exertion over the past few days prior to admission. Patient stated he may have gained at least 70 pounds in the last 1 month prior to this presentation, but true baseline weight is unknown.   During his hospitalization, echocardiogram was obtained, but was unable to be adequately evaluated due to body habitus. Patient was diuresed with IV Lasix and spironolactone. Cardiology has been managing diuretic dosage. He has lost approximately 130 pounds and diuresed over 48L.  Patient was previously found (before hospitalization) to test positive for MSSA and Pseudomonas Aeruginosa in right lower extremity wound.  Was started on IV Zosyn.  Discussed on phone with ID and will need 3 weeks of IV antibiotics.  Discharge Diagnoses:  Principal Problem:   Acute respiratory failure with hypoxia (HCC) Active Problems:   Venous stasis ulcer (HCC)   Morbid obesity (HCC)   Chronic acquired lymphedema   Type 2 diabetes mellitus (HCC)   SOB (shortness of breath)   Hypertensive urgency   Abdominal pain   OSA (obstructive sleep apnea)   Acute  respiratory failure (HCC)   NSVT (nonsustained ventricular tachycardia) (HCC)   Hypokalemia   Generalized edema   Acute on chronic congestive heart failure (HCC)  vVolume overload - Unclear if due to CHF or compressive lymphedema  - Unable to adequately get echo results due to pt body habitus. Unable to do any further noninvasive testing due to body habitus. - Continues to diurese well  - Continue lasix, spironolactone but will switch to PO - Appreciate cardiology, awaiting recommendations for PO dosage  - need follow up with cardiology in 1 week after discharge - continue current PO medications  Essential hypertension - Continue lisinopril 40mg  daily - increase coreg to 12.5mg  BID  Venous insufficiency/lymphedema with ulceration of greater than 1 year duration, POA - Evaluated by wound RN.  - Measurement: 9cm x 20cm area of involvement with 0.8cm depth at the deepest point. Dressing procedure/placement/frequency: twice weekly (Tuesday/Friday) change schedule using a silver hydrofiber dressing as a wound contact layer and suggest home care be considered as a more viable option for routine dressing changes. [facility]- HHRN for wound care teaching at discharge - will follow up with wound care center after discharge - Finished 7 day course of doxycycline during this hospitalization - Patient apparently received a phone call from his wound [facility] indicating that his wound culture from 1/3 showed MSSA and pseudomonas. This was resistant to Levaquin. Please see Care Everywhere for results. Patient was started on Zosyn on 1/23. - will require 3 weeks total of IV Zosyn - PICC line placed today  Morbid obesity due to excess calories  - Body mass index is 74.38  kg/m - Seen by nutritionist - Counseled on diet and nutrition, encourage weight loss, increase in activity and exercise  - Will need to be evaluated by bariatric surgery as outpatient  - patient states he is planning on  follow up with bariatric clinic in the next week or two  Sleep apnea - CPAP at night   Discharge Instructions  Discharge Instructions    Call MD for:  difficulty breathing, headache or visual disturbances    Complete by:  As directed    Call MD for:  extreme fatigue    Complete by:  As directed    Call MD for:  hives    Complete by:  As directed    Call MD for:  persistant dizziness or light-headedness    Complete by:  As directed    Call MD for:  persistant nausea and vomiting    Complete by:  As directed    Call MD for:  severe uncontrolled pain    Complete by:  As directed    Call MD for:  temperature >100.4    Complete by:  As directed    Diet - low sodium heart healthy    Complete by:  As directed    Discharge instructions    Complete by:  As directed    Take new prescriptions as prescribed Please follow up with cardiology as discussed Follow up with wound care next week Continue IV antibiotics as prescribed   Increase activity slowly    Complete by:  As directed      Allergies as of 04/05/2016      Reactions   Lidocaine Other (See Comments)   "burns my skin"   Peanut-containing Drug Products Swelling      Medication List    STOP taking these medications   doxycycline 100 MG tablet Commonly known as:  VIBRA-TABS   levofloxacin 500 MG tablet Commonly known as:  LEVAQUIN   lisinopril-hydrochlorothiazide 20-25 MG tablet Commonly known as:  PRINZIDE,ZESTORETIC     TAKE these medications   furosemide 80 MG tablet Commonly known as:  LASIX Take 1 tablet (80 mg total) by mouth 2 (two) times daily.   HYDROcodone-acetaminophen 5-325 MG tablet Commonly known as:  NORCO/VICODIN Take 1 tablet by mouth every 6 (six) hours as needed for moderate pain.   ibuprofen 200 MG tablet Commonly known as:  ADVIL,MOTRIN Take 800 mg by mouth every 6 (six) hours as needed for moderate pain.   lisinopril 40 MG tablet Commonly known as:  PRINIVIL,ZESTRIL Take 1 tablet (40  mg total) by mouth daily. Start taking on:  04/06/2016   piperacillin-tazobactam 3.375 GM/50ML IVPB Commonly known as:  ZOSYN Inject 50 mLs (3.375 g total) into the vein every 8 (eight) hours.   potassium chloride SA 20 MEQ tablet Commonly known as:  K-DUR,KLOR-CON Take 2 tablets (40 mEq total) by mouth 3 (three) times daily.   spironolactone 25 MG tablet Commonly known as:  ALDACTONE Take 1 tablet (25 mg total) by mouth daily. Start taking on:  04/06/2016      Follow-up Information    [facility] Follow up.   Why:  HHRN for wound Care Instruction as well as Home IV abx. A representative will be in touch with you prior to your discharge to finalize these arrangements.  Contact information: [address] [place] [address] [phone]        [facility], [place] Follow up.   Specialties:  Physician Assistant, Cardiology Why:  Cardiology Hospital Follow-Up on 04/12/2016 at 10:00AM.  Contact information: [address] [place] [address] [phone]          Allergies  Allergen Reactions  . Lidocaine Other (See Comments)    "burns my skin"  . Peanut-Containing Drug Products Swelling    Consultations:  Cardiology  PT/OT  CM   Procedures/Studies: Dg Chest 2 View  Result Date: 03/20/2016 CLINICAL DATA:  Shortness of breath and abdominal swelling for 3 days. Chronic lymphedema. High blood pressure. EXAM: CHEST  2 VIEW COMPARISON:  12/26/2015 FINDINGS: Shallow inspiration. Borderline heart size without vascular congestion or edema. No focal consolidation or airspace disease. No blunting of costophrenic angles. No pneumothorax. Calcified granuloma in the right lung. IMPRESSION: Cardiac enlargement.  No evidence of active pulmonary disease. Electronically Signed   By: Burman Nieves M.D.   On: 03/20/2016 01:39   Ct Angio Chest Pe W Or Wo Contrast  Result Date: 03/20/2016 CLINICAL DATA:  36 year old male with super  morbid obesity (631 pounds). Increasing shortness of breath. Increasing weight and abdominal girth recently. Hypertensive at presentation. Initial encounter. EXAM: CT ANGIOGRAPHY CHEST CT ABDOMEN AND PELVIS WITH CONTRAST TECHNIQUE: Multidetector CT imaging of the chest was performed using the standard protocol during bolus administration of intravenous contrast. Multiplanar CT image reconstructions and MIPs were obtained to evaluate the vascular anatomy. Multidetector CT imaging of the abdomen and pelvis was performed using the standard protocol during bolus administration of intravenous contrast. CONTRAST:  100 mL Isovue 370 COMPARISON:  CT Abdomen and Pelvis 11/05/2011, 05/09/2009 FINDINGS: CTA CHEST FINDINGS Cardiovascular: Suboptimal contrast bolus timing in the pulmonary arterial tree. Quantum mottle artifact. Mild respiratory motion artifact. No central pulmonary embolus. The lobar and distal pulmonary arteries are not well evaluated. Mild cardiomegaly is new since 2013. No pericardial effusion. Negative thoracic aorta. Mediastinum/Nodes: No lymphadenopathy. Lungs/Pleura: Major airways are patent except for atelectatic changes. There is a calcified granuloma in the right upper lobe. Lower lung volumes compared to 2013 with crowding of lung markings but no abnormal pulmonary opacity or pleural effusion. Musculoskeletal: No acute osseous abnormality identified. Review of the MIP images confirms the above findings. CT ABDOMEN and PELVIS FINDINGS Hepatobiliary: The liver appears stable and within normal limits. Gallbladder detail is obscured, but the gallbladder is not abnormally dilated. Pancreas: The pancreatic tail appears within normal limits, the body and head of the pancreas are obscured by quantum mottle. Spleen: The spleen appears stable and within normal limits. Adrenals/Urinary Tract: No hydronephrosis. On delayed images there is bilateral renal excretion of IV contrast. The urinary bladder is distended  but otherwise appears normal. Stomach/Bowel: Negative rectum. The splenic flexure and distal transverse colon appear within normal limits. Proximal small bowel loops and the hepatic flexure appear within normal limits on the delayed excretory images. Other small and large bowel loops are obscured by quantum mottle. Negative proximal stomach. Vascular/Lymphatic: Numerous chronic venous collaterals about both inguinal regions and the lower abdominal panniculus. Chronic right inguinal postoperative changes with numerous surgical clips, stable since 2013. Quantum mottle degrades detail of the abdominal aorta and iliac arteries, the abdominal aorta and proximal common iliac arteries appear within normal limits on the delayed excretory phase images. Chronically enlarged bilateral inguinal lymph nodes appear not significantly changed since 2013. There is evidence of right greater than left external iliac artery lymphadenopathy which has increased since 2013 with individual nodes measuring up to 27 mm short axis (previously 18 mm). The mesenteric and retroperitoneal spaces in the abdomen and  pelvis are poorly visualized due to quantum mottle. It is difficult to exclude retroperitoneal lymphadenopathy in the upper abdomen. Reproductive: Negative. Other: No pelvic free fluid. Musculoskeletal: Lumbar and pelvic bone detail degraded by body habitus/quantum mottle. No acute osseous abnormality identified. Review of the MIP images confirms the above findings. IMPRESSION: Limited diagnostic information, especially in the abdomen, related to large patient size. The following conclusions can be made: - No central pulmonary embolus, with lobar and distal pulmonary arteries not well evaluated. - Negative aorta. Mild cardiomegaly is new since 2013. No pericardial effusion. - Pulmonary atelectasis, but no other lung abnormality or or pleural effusion. - No renal obstruction. - Chronic inguinal lymphadenopathy appears stable since 2013  although right external iliac artery adenopathy has progressed and is now moderate to severe (individual nodes up to 27 mm short axis). This is nonspecific. Sequelae of chronic infection/inflammation seems more likely than a systemic lymphoproliferative disorder or Lymphoma considering lack of thoracic and no definite upper abdominal lymphadenopathy. - Chronic venous collaterals about the pelvis, stable since 2013. Electronically Signed   By: Odessa Fleming M.D.   On: 03/20/2016 13:56   Ct Abdomen Pelvis W Contrast  Result Date: 03/20/2016 CLINICAL DATA:  36 year old male with super morbid obesity (631 pounds). Increasing shortness of breath. Increasing weight and abdominal girth recently. Hypertensive at presentation. Initial encounter. EXAM: CT ANGIOGRAPHY CHEST CT ABDOMEN AND PELVIS WITH CONTRAST TECHNIQUE: Multidetector CT imaging of the chest was performed using the standard protocol during bolus administration of intravenous contrast. Multiplanar CT image reconstructions and MIPs were obtained to evaluate the vascular anatomy. Multidetector CT imaging of the abdomen and pelvis was performed using the standard protocol during bolus administration of intravenous contrast. CONTRAST:  100 mL Isovue 370 COMPARISON:  CT Abdomen and Pelvis 11/05/2011, 05/09/2009 FINDINGS: CTA CHEST FINDINGS Cardiovascular: Suboptimal contrast bolus timing in the pulmonary arterial tree. Quantum mottle artifact. Mild respiratory motion artifact. No central pulmonary embolus. The lobar and distal pulmonary arteries are not well evaluated. Mild cardiomegaly is new since 2013. No pericardial effusion. Negative thoracic aorta. Mediastinum/Nodes: No lymphadenopathy. Lungs/Pleura: Major airways are patent except for atelectatic changes. There is a calcified granuloma in the right upper lobe. Lower lung volumes compared to 2013 with crowding of lung markings but no abnormal pulmonary opacity or pleural effusion. Musculoskeletal: No acute  osseous abnormality identified. Review of the MIP images confirms the above findings. CT ABDOMEN and PELVIS FINDINGS Hepatobiliary: The liver appears stable and within normal limits. Gallbladder detail is obscured, but the gallbladder is not abnormally dilated. Pancreas: The pancreatic tail appears within normal limits, the body and head of the pancreas are obscured by quantum mottle. Spleen: The spleen appears stable and within normal limits. Adrenals/Urinary Tract: No hydronephrosis. On delayed images there is bilateral renal excretion of IV contrast. The urinary bladder is distended but otherwise appears normal. Stomach/Bowel: Negative rectum. The splenic flexure and distal transverse colon appear within normal limits. Proximal small bowel loops and the hepatic flexure appear within normal limits on the delayed excretory images. Other small and large bowel loops are obscured by quantum mottle. Negative proximal stomach. Vascular/Lymphatic: Numerous chronic venous collaterals about both inguinal regions and the lower abdominal panniculus. Chronic right inguinal postoperative changes with numerous surgical clips, stable since 2013. Quantum mottle degrades detail of the abdominal aorta and iliac arteries, the abdominal aorta and proximal common iliac arteries appear within normal limits on the delayed excretory phase images. Chronically enlarged bilateral inguinal lymph nodes appear not significantly changed since  2013. There is evidence of right greater than left external iliac artery lymphadenopathy which has increased since 2013 with individual nodes measuring up to 27 mm short axis (previously 18 mm). The mesenteric and retroperitoneal spaces in the abdomen and pelvis are poorly visualized due to quantum mottle. It is difficult to exclude retroperitoneal lymphadenopathy in the upper abdomen. Reproductive: Negative. Other: No pelvic free fluid. Musculoskeletal: Lumbar and pelvic bone detail degraded by body  habitus/quantum mottle. No acute osseous abnormality identified. Review of the MIP images confirms the above findings. IMPRESSION: Limited diagnostic information, especially in the abdomen, related to large patient size. The following conclusions can be made: - No central pulmonary embolus, with lobar and distal pulmonary arteries not well evaluated. - Negative aorta. Mild cardiomegaly is new since 2013. No pericardial effusion. - Pulmonary atelectasis, but no other lung abnormality or or pleural effusion. - No renal obstruction. - Chronic inguinal lymphadenopathy appears stable since 2013 although right external iliac artery adenopathy has progressed and is now moderate to severe (individual nodes up to 27 mm short axis). This is nonspecific. Sequelae of chronic infection/inflammation seems more likely than a systemic lymphoproliferative disorder or Lymphoma considering lack of thoracic and no definite upper abdominal lymphadenopathy. - Chronic venous collaterals about the pelvis, stable since 2013. Electronically Signed   By: Odessa Fleming M.D.   On: 03/20/2016 13:56       Subjective: Patient very excited to leave today- it [birth year].  To receive IV antibiotic training prior to discharge.  Discharge Exam: Vitals:   04/04/16 2216 04/05/16 0542  BP: 121/79 (!) 132/56  Pulse: 85 85  Resp: 18 18  Temp: 98.2 F (36.8 C) 98.3 F (36.8 C)   Vitals:   04/04/16 0444 04/04/16 1228 04/04/16 2216 04/05/16 0542  BP: 112/77 118/67 121/79 (!) 132/56  Pulse: 90 (!) 102 85 85  Resp: 19 18 18 18   Temp: 98.9 F (37.2 C) 97.6 F (36.4 C) 98.2 F (36.8 C) 98.3 F (36.8 C)  TempSrc: Oral Oral Oral Oral  SpO2: 99% 100% 100% 99%  Weight: (!) 221.6 kg (488 lb 9.6 oz)   (!) 221.4 kg (488 lb 3.2 oz)  Height:        General: Pt is alert, awake, not in acute distress Cardiovascular: RRR, S1/S2 +, no rubs, no gallops Respiratory: CTA bilaterally, no wheezing, no rhonchi Abdominal: Soft, obese, NT, ND,  bowel sounds + Extremities: no edema, no cyanosis    The results of significant diagnostics from this hospitalization (including imaging, microbiology, ancillary and laboratory) are listed below for reference.     Microbiology: No results found for this or any previous visit (from the past 240 hour(s)).   Labs: BNP (last 3 results)  Recent Labs  12/26/15 2136 03/20/16 0118  BNP 132.2* 171.0*   Basic Metabolic Panel:  Recent Labs Lab 03/30/16 0552 03/31/16 0600 04/01/16 0459 04/03/16 0500 04/05/16 0643  NA 137 136 137 136 139  K 4.4 4.1 4.5 4.2 4.3  CL 100* 98* 101 100* 102  CO2 29 30 26 28 29   GLUCOSE 108* 109* 96 109* 112*  BUN 18 18 18  22* 25*  CREATININE 0.85 0.86 0.81 1.05 1.15  CALCIUM 9.0 8.9 8.9 9.1 9.4   Liver Function Tests: No results for input(s): AST, ALT, ALKPHOS, BILITOT, PROT, ALBUMIN in the last 168 hours. No results for input(s): LIPASE, AMYLASE in the last 168 hours. No results for input(s): AMMONIA in the last 168 hours. CBC: No results  for input(s): WBC, NEUTROABS, HGB, HCT, MCV, PLT in the last 168 hours. Cardiac Enzymes: No results for input(s): CKTOTAL, CKMB, CKMBINDEX, TROPONINI in the last 168 hours. BNP: Invalid input(s): POCBNP CBG: No results for input(s): GLUCAP in the last 168 hours. D-Dimer No results for input(s): DDIMER in the last 72 hours. Hgb A1c No results for input(s): HGBA1C in the last 72 hours. Lipid Profile No results for input(s): CHOL, HDL, LDLCALC, TRIG, CHOLHDL, LDLDIRECT in the last 72 hours. Thyroid function studies No results for input(s): TSH, T4TOTAL, T3FREE, THYROIDAB in the last 72 hours.  Invalid input(s): FREET3 Anemia work up No results for input(s): VITAMINB12, FOLATE, FERRITIN, TIBC, IRON, RETICCTPCT in the last 72 hours. Urinalysis    Component Value Date/Time   LABSPEC >=1.030 08/27/2014 1602   PHURINE 6.0 08/27/2014 1602   GLUCOSEU NEGATIVE 08/27/2014 1602   HGBUR TRACE (A) 08/27/2014 1602    BILIRUBINUR NEGATIVE 08/27/2014 1602   KETONESUR NEGATIVE 08/27/2014 1602   PROTEINUR 30 (A) 08/27/2014 1602   UROBILINOGEN 0.2 08/27/2014 1602   NITRITE NEGATIVE 08/27/2014 1602   LEUKOCYTESUR SMALL (A) 08/27/2014 1602   Sepsis Labs Invalid input(s): PROCALCITONIN,  WBC,  LACTICIDVEN Microbiology No results found for this or any previous visit (from the past 240 hour(s)).   Time coordinating discharge: Over 30 minutes  SIGNED:   Katrinka Blazing, MD  [facility] 04/05/2016, 11:34 AM Pager [phone] If 7PM-7AM, please contact night-coverage [email or web address] Password [email or web address]

## 2016-04-05 NOTE — Telephone Encounter (Signed)
New message   TCM 04/12/2016 Ellsworth LennoxBrittany M Strader, PA @ 10:00am

## 2016-04-05 NOTE — Telephone Encounter (Signed)
04/05/2016 Received fax [facility] from [facility] for upcoming appointment on 04/12/2016  With Randall AnBrittany Strader, PA-C.  [facility]  [facility]

## 2016-04-06 ENCOUNTER — Other Ambulatory Visit: Payer: Self-pay | Admitting: Physician Assistant

## 2016-04-06 ENCOUNTER — Telehealth: Payer: Self-pay | Admitting: Physician Assistant

## 2016-04-06 NOTE — Telephone Encounter (Signed)
Paged by the patient regarding the potassium medication interaction with spironolactone. The potassium was prescribed by hospitalist group, he is on 80 mg twice a day of Lasix, lisinopril and spironolactone. He also take 40 mEq 3 times a day dose of potassium supplement. Given the very high dose potassium supplement, I am concerned about hyperkalemia, I have sent a staff message to our office to contact the patient to arrange a basic metabolic panel next Thursday before  is follow-up with Gentry RochBrittney Strader next Friday.  Ramond DialSigned, Carleton Vanvalkenburgh PA Pager: [phone]

## 2016-04-08 DIAGNOSIS — E119 Type 2 diabetes mellitus without complications: Secondary | ICD-10-CM | POA: Diagnosis not present

## 2016-04-08 DIAGNOSIS — Z79891 Long term (current) use of opiate analgesic: Secondary | ICD-10-CM | POA: Diagnosis not present

## 2016-04-08 DIAGNOSIS — I890 Lymphedema, not elsewhere classified: Secondary | ICD-10-CM | POA: Diagnosis not present

## 2016-04-08 DIAGNOSIS — I10 Essential (primary) hypertension: Secondary | ICD-10-CM | POA: Diagnosis not present

## 2016-04-08 DIAGNOSIS — L97919 Non-pressure chronic ulcer of unspecified part of right lower leg with unspecified severity: Secondary | ICD-10-CM | POA: Diagnosis not present

## 2016-04-08 DIAGNOSIS — Z5181 Encounter for therapeutic drug level monitoring: Secondary | ICD-10-CM | POA: Diagnosis not present

## 2016-04-08 DIAGNOSIS — I872 Venous insufficiency (chronic) (peripheral): Secondary | ICD-10-CM | POA: Diagnosis not present

## 2016-04-08 NOTE — Telephone Encounter (Signed)
[facility].  Patient understands to follow up with provider Randall AnBrittany Strader on 04/12/16 at 10:00a at [facility]. Patient understands discharge instructions? Yes Patient understands medications and regimen? Yes Patient understands to bring all medications to this visit? Yes

## 2016-04-08 NOTE — Telephone Encounter (Signed)
BMET ordered per Hao's instruction for K+ recheck, patient aware [facility] the day prior to office visit.

## 2016-04-09 DIAGNOSIS — L97212 Non-pressure chronic ulcer of right calf with fat layer exposed: Secondary | ICD-10-CM | POA: Diagnosis not present

## 2016-04-09 DIAGNOSIS — L97812 Non-pressure chronic ulcer of other part of right lower leg with fat layer exposed: Secondary | ICD-10-CM | POA: Diagnosis not present

## 2016-04-09 DIAGNOSIS — I890 Lymphedema, not elsewhere classified: Secondary | ICD-10-CM | POA: Diagnosis not present

## 2016-04-09 DIAGNOSIS — Z79891 Long term (current) use of opiate analgesic: Secondary | ICD-10-CM | POA: Diagnosis not present

## 2016-04-09 DIAGNOSIS — A499 Bacterial infection, unspecified: Secondary | ICD-10-CM | POA: Diagnosis not present

## 2016-04-09 DIAGNOSIS — I872 Venous insufficiency (chronic) (peripheral): Secondary | ICD-10-CM | POA: Diagnosis not present

## 2016-04-09 DIAGNOSIS — I87311 Chronic venous hypertension (idiopathic) with ulcer of right lower extremity: Secondary | ICD-10-CM | POA: Diagnosis not present

## 2016-04-11 DIAGNOSIS — I890 Lymphedema, not elsewhere classified: Secondary | ICD-10-CM | POA: Diagnosis not present

## 2016-04-11 DIAGNOSIS — Z86718 Personal history of other venous thrombosis and embolism: Secondary | ICD-10-CM | POA: Diagnosis not present

## 2016-04-11 DIAGNOSIS — L97821 Non-pressure chronic ulcer of other part of left lower leg limited to breakdown of skin: Secondary | ICD-10-CM | POA: Diagnosis not present

## 2016-04-11 DIAGNOSIS — Z6841 Body Mass Index (BMI) 40.0 and over, adult: Secondary | ICD-10-CM | POA: Diagnosis not present

## 2016-04-11 DIAGNOSIS — I82521 Chronic embolism and thrombosis of right iliac vein: Secondary | ICD-10-CM | POA: Diagnosis not present

## 2016-04-11 DIAGNOSIS — L97203 Non-pressure chronic ulcer of unspecified calf with necrosis of muscle: Secondary | ICD-10-CM | POA: Diagnosis not present

## 2016-04-11 DIAGNOSIS — T148XXA Other injury of unspecified body region, initial encounter: Secondary | ICD-10-CM | POA: Diagnosis not present

## 2016-04-11 DIAGNOSIS — I509 Heart failure, unspecified: Secondary | ICD-10-CM | POA: Diagnosis not present

## 2016-04-11 DIAGNOSIS — R590 Localized enlarged lymph nodes: Secondary | ICD-10-CM | POA: Diagnosis not present

## 2016-04-11 NOTE — Progress Notes (Signed)
5   Cardiology Office Note    Date:  04/12/2016   ID:  [name], DOB 25-Nov-1980, MRN [id number]  PCP:  Lupe Carneyean Mitchell, MD  Cardiologist: Dr. Duke Salviaandolph  Chief Complaint  Patient presents with  . Hospitalization Follow-up    History of Present Illness:    [name] is a 36 y.o. male with past medical history of HTN, HLD, Type 2 DM, morbid obesty, OSA, chronic venous insufficiency, and chronic heart failure (unknown type as echo uninterpretable) who presents to the office today for hospital follow-up.   Was admitted from 1/10 - 04/05/2016 for acute CHF exacerbation. Echo was uninterpretable. He was diuresed with IV Lasix with a net output of -47.2L and weight down over 143 lbs (631 lbs on admission to 488 lbs at the time of discharge). Creatinine was stable at 1.15 on the day of discharge and his medication regimen included Lasix 80mg  BID, Lisinopril 40mg  daily, and Spironolactone 25mg  daily. Had been on Coreg 12.5mg  BID while admitted but this was not included on his discharge summary. He was also treated for an ulceration with the wound culture showing MSSA and Pseudomonas and it was recommended he continue IV Zosyn for 3 weeks after hospital discharge (PICC in place).   In talking with the patient today, he reports his breathing has remained improved since hospital discharge. Weight was 493 pounds on his home scales today. He just purchased the scales yesterday therefore unknown weight on home scales at time of hospital discharge. Reports edema has remained at baseline. No chest pain or palpitations. No orthopnea or PND.   Reports home health RN is coming out for his PICC line once weekly. This was changed on Tuesday and since then he has experienced significant pain along his right arm and notes a mild drainage around the insertion site. Denies any recent fevers or chills.  Past Medical History:  Diagnosis Date  . Chronic venous insufficiency    a. as a premature infant, required  venous access for care with subsequent DVT and some sort of procedure -> eventually went on to have vein bypass around 2005.  [name]. Hypertension   . Lymphedema   . Morbid obesity (HCC)   . OSA on CPAP     Past Surgical History:  Procedure Laterality Date  . balloon stenting Right    right leg  . VEIN LIGATION AND STRIPPING      Current Medications: Outpatient Medications Prior to Visit  Medication Sig Dispense Refill  . HYDROcodone-acetaminophen (NORCO/VICODIN) 5-325 MG tablet Take 1 tablet by mouth every 6 (six) hours as needed for moderate pain. 30 tablet 0  . ibuprofen (ADVIL,MOTRIN) 200 MG tablet Take 800 mg by mouth every 6 (six) hours as needed for moderate pain.    . piperacillin-tazobactam (ZOSYN) 3.375 GM/50ML IVPB Inject 50 mLs (3.375 g total) into the vein every 8 (eight) hours. 2700 mL 0  . potassium chloride SA (K-DUR,KLOR-CON) 20 MEQ tablet Take 2 tablets (40 mEq total) by mouth 3 (three) times daily. 90 tablet 0  . furosemide (LASIX) 80 MG tablet Take 1 tablet (80 mg total) by mouth 2 (two) times daily. 60 tablet 0  . lisinopril (PRINIVIL,ZESTRIL) 40 MG tablet Take 1 tablet (40 mg total) by mouth daily. 30 tablet 0  . spironolactone (ALDACTONE) 25 MG tablet Take 1 tablet (25 mg total) by mouth daily. 30 tablet 0   No facility-administered medications prior to visit.      Allergies:   Lidocaine and  Peanut-containing drug products   Social History   Social History  . Marital status: Single    Spouse name: N/A  . Number of children: N/A  . Years of education: N/A   Social History Main Topics  . Smoking status: Never Smoker  . Smokeless tobacco: Never Used  . Alcohol use 2.0 oz/week    4 Standard drinks or equivalent per week     Comment: liquor - pt states once a week  . Drug use: No  . Sexual activity: Not Asked   Other Topics Concern  . None   Social History Narrative  . None     Family History:  The patient's family history includes Cancer in his  father, maternal grandfather, and maternal grandmother; Diabetes in his mother.   Review of Systems:   Please see the history of present illness.     General:  No chills, fever, night sweats or weight changes. Positive for pain along right arm.  Cardiovascular:  No chest pain, dyspnea on exertion, orthopnea, palpitations, paroxysmal nocturnal dyspnea. Positive for edema.  Dermatological: No rash, lesions/masses Respiratory: No cough, dyspnea Urologic: No hematuria, dysuria Abdominal:   No nausea, vomiting, diarrhea, bright red blood per rectum, melena, or hematemesis Neurologic:  No visual changes, wkns, changes in mental status. All other systems reviewed and are otherwise negative except as noted above.   Physical Exam:    VS:  BP (!) 157/104   Pulse 89   Ht 6\' 2"  (1.88 m)   Wt (!) 498 lb (225.9 kg) Comment: Simultaneous filing. User may not have seen previous data.  BMI 63.94 kg/m    General: Well developed, morbidly obese African American male appearing in no acute distress. Head: Normocephalic, atraumatic, sclera non-icteric, no xanthomas, nares are without discharge.  Neck: No carotid bruits. JVD unable to be assessed secondary to body habitus.  Lungs: Respirations regular and unlabored, without wheezes or rales.  Heart: Regular rate and rhythm. No S3 or S4.  No murmur, no rubs, or gallops appreciated. Abdomen: Soft, non-tender, non-distended with normoactive bowel sounds. No hepatomegaly. No rebound/guarding. No obvious abdominal masses. Msk:  Strength and tone appear normal for age. No joint deformities or effusions. Extremities: No clubbing or cyanosis. Chronic lower extremity edema.  Distal pedal pulses are 2+ bilaterally. Right PICC site with mild drainage and diffusely tender along right bicep and tricep.  Neuro: Alert and oriented X 3. Moves all extremities spontaneously. No focal deficits noted. Psych:  Responds to questions appropriately with a normal affect. Skin: No  rashes or lesions noted  Wt Readings from Last 3 Encounters:  04/12/16 (!) 498 lb (225.9 kg)  04/05/16 (!) 488 lb 3.2 oz (221.4 kg)  12/26/15 (!) 527 lb (239 kg)     Studies/Labs Reviewed:   EKG:  EKG is not ordered today.   Recent Labs: 03/20/2016: ALT 22; B Natriuretic Peptide 171.0 03/22/2016: Hemoglobin 10.7; Platelets 239 03/23/2016: TSH 5.569 03/24/2016: Magnesium 1.9 04/05/2016: BUN 25; Creatinine, Ser 1.15; Potassium 4.3; Sodium 139   Lipid Panel No results found for: CHOL, TRIG, HDL, CHOLHDL, VLDL, LDLCALC, LDLDIRECT  Additional studies/ records that were reviewed today include:   Echo: 03/21/2016 Impressions:  - Uninterpretable study, even after Definity contrast   administration.  Assessment:    1. Chronic congestive heart failure, unspecified congestive heart failure type (HCC)   2. Essential hypertension   3. Morbid obesity (HCC)   4. Wound infection      Plan:   In order of  problems listed above:  1. Chronic Congestive Heart Failure - unknown type as recent echo in 03/2016 was uninterruptable. Not an ideal TEE candidate with his morbid obesity.   - recently admitted in 03/2016 with significant diuresis, a net output of -47.2L and weight down over 143 lbs (631 lbs on admission to 488 lbs at the time of discharge) - Has been on Lasix 80mg  BID, Lisinopril 40mg  daily, and Spironolactone 25mg  daily. Reports breathing has remained stable since hospital discharge. Just purchased scales yesterday and weight was 493 pounds (likely more accurate than office scales today as he was wearing large boots and a jacket when today's weight was obtained).  - Will continue Lasix at 80mg  BID. Recheck BMET today to assess kidney function and K+ (as he is taking K+ supplementation and Spironolactone). Importance of fluid restriction and a low-sodium diet was reviewed with the patient. Will restart Coreg 12.5mg  BID in the setting of his elevated BP and unknown LV function. Continue  ACE-I.   2. Essential HTN - BP elevated at 157/104 during today's visit.  - continue Lisinopril 40mg  daily and Spironolactone 25mg  daily. He was taking Coreg 12.5mg  BID while inpatient but this was unfortunately not prescribed at the time of discharge. Will restart Coreg 12.5mg  BID today.   3. Morbid Obesity - reports being more active following significant weight loss. Continued exercise and diet modification was encouraged.   4. Wound Infection - recent wound culture showed MSSA and Pseudomonas and he is currently receiving IV Zosyn for 3 weeks. - reports PICC was changed on Tuesday and since then he has experienced significant pain along his right arm and notes a mild drainage around the insertion site.  - will notify [facility] RN to come assess his PICC later today.  Medication Adjustments/Labs and Tests Ordered: Current medicines are reviewed at length with the patient today.  Concerns regarding medicines are outlined above.  Medication changes, Labs and Tests ordered today are listed in the Patient Instructions below. Patient Instructions  Medication Instructions:  START taking Coreg 12.5 mg (1 tablet) two times daily.    Labwork: Have lab work completed today Designer, jewellery).  Testing/Procedures: NONE  Follow-Up: Friday 05/10/16 at 11:30 with Dr. Duke Salvia at [facility] office  Any Other Special Instructions Will Be Listed Below (If Applicable).  We have contacted [facility] to have your [facility] RN visit you today to assess your PICC line.  They should contact you to set a time.  If you do not hear from them call [phone] TODAY.   If you need a refill on your cardiac medications before your next appointment, please call your pharmacy.    Lorri Frederick, [place]  04/12/2016 9:17 PM    [facility] [address] [place], [address] [place], [place]  [address] Phone: [phone]; Fax: [phone]  [address] [place], [place] [address] Phone:  [phone]

## 2016-04-12 ENCOUNTER — Other Ambulatory Visit: Payer: Self-pay | Admitting: Physician Assistant

## 2016-04-12 ENCOUNTER — Ambulatory Visit (INDEPENDENT_AMBULATORY_CARE_PROVIDER_SITE_OTHER): Payer: BLUE CROSS/BLUE SHIELD | Admitting: Student

## 2016-04-12 ENCOUNTER — Encounter: Payer: Self-pay | Admitting: Student

## 2016-04-12 VITALS — BP 157/104 | HR 89 | Ht 74.0 in | Wt >= 6400 oz

## 2016-04-12 DIAGNOSIS — L089 Local infection of the skin and subcutaneous tissue, unspecified: Secondary | ICD-10-CM

## 2016-04-12 DIAGNOSIS — Z79899 Other long term (current) drug therapy: Secondary | ICD-10-CM | POA: Diagnosis not present

## 2016-04-12 DIAGNOSIS — I509 Heart failure, unspecified: Secondary | ICD-10-CM

## 2016-04-12 DIAGNOSIS — Z79891 Long term (current) use of opiate analgesic: Secondary | ICD-10-CM | POA: Diagnosis not present

## 2016-04-12 DIAGNOSIS — I872 Venous insufficiency (chronic) (peripheral): Secondary | ICD-10-CM | POA: Diagnosis not present

## 2016-04-12 DIAGNOSIS — T148XXA Other injury of unspecified body region, initial encounter: Secondary | ICD-10-CM

## 2016-04-12 DIAGNOSIS — I10 Essential (primary) hypertension: Secondary | ICD-10-CM | POA: Diagnosis not present

## 2016-04-12 LAB — BASIC METABOLIC PANEL
BUN: 23 mg/dL (ref 7–25)
CALCIUM: 9.4 mg/dL (ref 8.6–10.3)
CO2: 24 mmol/L (ref 20–31)
Chloride: 99 mmol/L (ref 98–110)
Creat: 1.01 mg/dL (ref 0.60–1.35)
GLUCOSE: 98 mg/dL (ref 65–99)
Potassium: 4.4 mmol/L (ref 3.5–5.3)
Sodium: 131 mmol/L — ABNORMAL LOW (ref 135–146)

## 2016-04-12 MED ORDER — FUROSEMIDE 80 MG PO TABS: 80.0000 mg | ORAL_TABLET | Freq: Two times a day (BID) | ORAL | 3 refills | Status: DC

## 2016-04-12 MED ORDER — CARVEDILOL 12.5 MG PO TABS: 12.5000 mg | ORAL_TABLET | Freq: Two times a day (BID) | ORAL | 3 refills | Status: DC

## 2016-04-12 MED ORDER — LISINOPRIL 40 MG PO TABS: 40.0000 mg | ORAL_TABLET | Freq: Every day | ORAL | 3 refills | Status: DC

## 2016-04-12 MED ORDER — SPIRONOLACTONE 25 MG PO TABS: 25.0000 mg | ORAL_TABLET | Freq: Every day | ORAL | 3 refills | Status: DC

## 2016-04-12 NOTE — Patient Instructions (Addendum)
Medication Instructions:  START taking Coreg 12.5 mg (1 tablet) two times daily.    Labwork: Have lab work completed today Designer, jewellery(BMET).  Testing/Procedures: NONE  Follow-Up: Friday 05/10/16 at 11:30 with Dr. Duke [facility]viaandolph at [facility]  Any Other Special Instructions Will Be Listed Below (If Applicable).  We have contacted [facility] you today to assess your PICC line.  They should contact you to set a time.  If you do not hear from them call [phone] TODAY.   If you need a refill on your cardiac medications before your next appointment, please call your pharmacy.

## 2016-04-14 DIAGNOSIS — Z79891 Long term (current) use of opiate analgesic: Secondary | ICD-10-CM | POA: Diagnosis not present

## 2016-04-14 DIAGNOSIS — I872 Venous insufficiency (chronic) (peripheral): Secondary | ICD-10-CM | POA: Diagnosis not present

## 2016-04-15 DIAGNOSIS — J9601 Acute respiratory failure with hypoxia: Secondary | ICD-10-CM | POA: Diagnosis not present

## 2016-04-15 DIAGNOSIS — Z79891 Long term (current) use of opiate analgesic: Secondary | ICD-10-CM | POA: Diagnosis not present

## 2016-04-15 DIAGNOSIS — I890 Lymphedema, not elsewhere classified: Secondary | ICD-10-CM | POA: Diagnosis not present

## 2016-04-15 DIAGNOSIS — G4733 Obstructive sleep apnea (adult) (pediatric): Secondary | ICD-10-CM | POA: Diagnosis not present

## 2016-04-15 DIAGNOSIS — E119 Type 2 diabetes mellitus without complications: Secondary | ICD-10-CM | POA: Diagnosis not present

## 2016-04-15 DIAGNOSIS — I872 Venous insufficiency (chronic) (peripheral): Secondary | ICD-10-CM | POA: Diagnosis not present

## 2016-04-16 ENCOUNTER — Telehealth: Payer: Self-pay | Admitting: Physician Assistant

## 2016-04-16 ENCOUNTER — Encounter (HOSPITAL_BASED_OUTPATIENT_CLINIC_OR_DEPARTMENT_OTHER): Payer: BLUE CROSS/BLUE SHIELD | Attending: Surgery

## 2016-04-16 DIAGNOSIS — I890 Lymphedema, not elsewhere classified: Secondary | ICD-10-CM | POA: Diagnosis not present

## 2016-04-16 DIAGNOSIS — L97212 Non-pressure chronic ulcer of right calf with fat layer exposed: Secondary | ICD-10-CM | POA: Diagnosis not present

## 2016-04-16 DIAGNOSIS — I87311 Chronic venous hypertension (idiopathic) with ulcer of right lower extremity: Secondary | ICD-10-CM | POA: Diagnosis not present

## 2016-04-16 DIAGNOSIS — I739 Peripheral vascular disease, unspecified: Secondary | ICD-10-CM | POA: Diagnosis not present

## 2016-04-16 DIAGNOSIS — Z86718 Personal history of other venous thrombosis and embolism: Secondary | ICD-10-CM | POA: Insufficient documentation

## 2016-04-16 DIAGNOSIS — L97812 Non-pressure chronic ulcer of other part of right lower leg with fat layer exposed: Secondary | ICD-10-CM | POA: Diagnosis not present

## 2016-04-16 DIAGNOSIS — I10 Essential (primary) hypertension: Secondary | ICD-10-CM | POA: Diagnosis not present

## 2016-04-16 NOTE — Telephone Encounter (Signed)
Called patient back to discuss labwork.  States he is needing a refill for his hydrocodone (cites continued shoulder and back pain) and notes this bottle is almost empty. Informed him I am not sure if advised to continue, (this was a dispense with no refills). Informed him I would send inquiry to Dr. Duke Salviaandolph to see if OK to prescribe. Pt aware this may need to be deferred to PCP to manage.

## 2016-04-16 NOTE — Telephone Encounter (Signed)
F/u Message ° °Pt returning RN call. Please call back to discuss  °

## 2016-04-16 NOTE — Telephone Encounter (Signed)
Please refer to his PCP for narcotic refills.

## 2016-04-17 DIAGNOSIS — L97821 Non-pressure chronic ulcer of other part of left lower leg limited to breakdown of skin: Secondary | ICD-10-CM | POA: Diagnosis not present

## 2016-04-17 NOTE — Telephone Encounter (Signed)
Advised patient, verbalized understanding  

## 2016-04-18 DIAGNOSIS — G4733 Obstructive sleep apnea (adult) (pediatric): Secondary | ICD-10-CM | POA: Diagnosis not present

## 2016-04-19 ENCOUNTER — Telehealth: Payer: Self-pay | Admitting: Cardiovascular Disease

## 2016-04-19 NOTE — Telephone Encounter (Signed)
New Message    Pt calling requesting to speak with nurse. Didn't want to go into detail about what it was about. Just said personal issue.

## 2016-04-19 NOTE — Telephone Encounter (Signed)
Returned call to patient-patient with multiple questions: wondering if he should continue fluid restriction, what type of exercise he should do and also if he should contact [place] for kits for PICC line.    Advised to continue fluid restriction at this time. Advised that any exercise would be good but to start slow and gradually increase intensity.  Recommended starting with walking.    Also advised to call [facility] in regards to kit for PICC line.    Patient verbalized understanding.

## 2016-04-22 DIAGNOSIS — I890 Lymphedema, not elsewhere classified: Secondary | ICD-10-CM | POA: Diagnosis not present

## 2016-04-22 DIAGNOSIS — Z79891 Long term (current) use of opiate analgesic: Secondary | ICD-10-CM | POA: Diagnosis not present

## 2016-04-22 DIAGNOSIS — I872 Venous insufficiency (chronic) (peripheral): Secondary | ICD-10-CM | POA: Diagnosis not present

## 2016-04-22 DIAGNOSIS — L97919 Non-pressure chronic ulcer of unspecified part of right lower leg with unspecified severity: Secondary | ICD-10-CM | POA: Diagnosis not present

## 2016-04-23 DIAGNOSIS — I10 Essential (primary) hypertension: Secondary | ICD-10-CM | POA: Diagnosis not present

## 2016-04-23 DIAGNOSIS — I739 Peripheral vascular disease, unspecified: Secondary | ICD-10-CM | POA: Diagnosis not present

## 2016-04-23 DIAGNOSIS — I87311 Chronic venous hypertension (idiopathic) with ulcer of right lower extremity: Secondary | ICD-10-CM | POA: Diagnosis not present

## 2016-04-23 DIAGNOSIS — I890 Lymphedema, not elsewhere classified: Secondary | ICD-10-CM | POA: Diagnosis not present

## 2016-04-23 DIAGNOSIS — Z86718 Personal history of other venous thrombosis and embolism: Secondary | ICD-10-CM | POA: Diagnosis not present

## 2016-04-23 DIAGNOSIS — L97212 Non-pressure chronic ulcer of right calf with fat layer exposed: Secondary | ICD-10-CM | POA: Diagnosis not present

## 2016-04-23 DIAGNOSIS — L97812 Non-pressure chronic ulcer of other part of right lower leg with fat layer exposed: Secondary | ICD-10-CM | POA: Diagnosis not present

## 2016-04-26 DIAGNOSIS — I872 Venous insufficiency (chronic) (peripheral): Secondary | ICD-10-CM | POA: Diagnosis not present

## 2016-04-26 DIAGNOSIS — Z79891 Long term (current) use of opiate analgesic: Secondary | ICD-10-CM | POA: Diagnosis not present

## 2016-04-29 ENCOUNTER — Telehealth: Payer: Self-pay | Admitting: *Deleted

## 2016-04-29 ENCOUNTER — Telehealth: Payer: Self-pay | Admitting: Student

## 2016-04-29 NOTE — Telephone Encounter (Signed)
Follow up  Dr. Celestia Khateal's Office fax  Fax: [phone]

## 2016-04-29 NOTE — Telephone Encounter (Signed)
called to verify pt's medications, verified with pt that it is ok to speak with Dr Corliss Marcuseal about pt's medication.  Dr.Teal wanted to verify that meds were correct, verified they were, also requested last OV and Labs.

## 2016-04-29 NOTE — Telephone Encounter (Signed)
Spoke to Automatic DataMichael, pharmD, regarding inquiry. He's needing specific recommendations for patient's fluid intake. Notes pt will be seeing a trainer, and they want to know if OK to advise increases in consumed fluids on exercise days. Their trainers and nutritionists who are helping patient are asking for specific guidelines, such as:  Fluid oz consumption on normal days Fluid oz consumption on exercise days Recommended salt limit.  They're aware of a 1600 ml intake limit noted by patient, but not of any further instructions. Aware I'll seek recommendation from provider and return call.

## 2016-04-29 NOTE — Telephone Encounter (Signed)
New message      Pt c/o medication issue:  1. Name of Medication: furosemide and spironolactone 2. How are you currently taking this medication (dosage and times per day)?   3. Are you having a reaction (difficulty breathing--STAT)? no 4. What is your medication issue? Calling to clairfy dosage on both medications

## 2016-04-29 NOTE — Telephone Encounter (Signed)
Follow up    [name] is calling to follow up about fluid restrictions

## 2016-04-30 DIAGNOSIS — L97812 Non-pressure chronic ulcer of other part of right lower leg with fat layer exposed: Secondary | ICD-10-CM | POA: Diagnosis not present

## 2016-04-30 DIAGNOSIS — L97212 Non-pressure chronic ulcer of right calf with fat layer exposed: Secondary | ICD-10-CM | POA: Diagnosis not present

## 2016-04-30 DIAGNOSIS — I890 Lymphedema, not elsewhere classified: Secondary | ICD-10-CM | POA: Diagnosis not present

## 2016-04-30 DIAGNOSIS — I10 Essential (primary) hypertension: Secondary | ICD-10-CM | POA: Diagnosis not present

## 2016-04-30 DIAGNOSIS — I739 Peripheral vascular disease, unspecified: Secondary | ICD-10-CM | POA: Diagnosis not present

## 2016-04-30 DIAGNOSIS — S81801A Unspecified open wound, right lower leg, initial encounter: Secondary | ICD-10-CM | POA: Diagnosis not present

## 2016-04-30 DIAGNOSIS — I87311 Chronic venous hypertension (idiopathic) with ulcer of right lower extremity: Secondary | ICD-10-CM | POA: Diagnosis not present

## 2016-04-30 DIAGNOSIS — Z86718 Personal history of other venous thrombosis and embolism: Secondary | ICD-10-CM | POA: Diagnosis not present

## 2016-04-30 NOTE — Telephone Encounter (Signed)
Please limit to 200 mL on exercise days

## 2016-04-30 NOTE — Telephone Encounter (Signed)
Follow Up   Called regarding where to send medical release form. Requesting call back

## 2016-05-01 ENCOUNTER — Telehealth: Payer: Self-pay | Admitting: Cardiovascular Disease

## 2016-05-01 NOTE — Telephone Encounter (Signed)
Called back. Casimiro NeedleMichael pharmD, had further questions.  Dr. Leonides Sakeandolph's note states recommendation for limit of 200ml on exercise days -- is this 200ml extra beyond his 1600ml standard day fluid intake, or 2,000 ml total?  Also had questions about pt's type of HF and last EF. Explained that the EF was not calculated, uninterpretable recent echo, probably this limits definitive diagnosis of HF as well.  Caller aware I will seek further advice/clarification from provider.

## 2016-05-01 NOTE — Telephone Encounter (Signed)
Clarified with Dr Monroe and should be 2,000 ml a day on exercised days Advised Michael PharmD Spoke with Lynn in medical records and release received, she will be faxing over records  

## 2016-05-01 NOTE — Telephone Encounter (Signed)
Clarified with Dr Duke Salviaandolph and should be 2,000 ml a day on exercised days Ruffin PyoAdvised Michael PharmD Spoke with Larita FifeLynn in medical records and release received, she will be faxing over records

## 2016-05-01 NOTE — Telephone Encounter (Signed)
New Message  Caller stated that he has been calling several days, what does Dr Duke Salviaandolph want to do with his restriction as far as how much should pt drink with exercise .

## 2016-05-02 ENCOUNTER — Telehealth: Payer: Self-pay | Admitting: Cardiovascular Disease

## 2016-05-02 NOTE — Telephone Encounter (Signed)
Close encounter 

## 2016-05-02 NOTE — Telephone Encounter (Signed)
Jodie EchevariaMichael Teal, Pharmacist is calling and is requesting we fax over the Basic metabolic panel for reference. His callback # is [phone]. Thanks.

## 2016-05-02 NOTE — Telephone Encounter (Signed)
Routed to Melinda, LPN 

## 2016-05-02 NOTE — Telephone Encounter (Signed)
Per Larita FifeLynn in medical records this was faxed today

## 2016-05-07 DIAGNOSIS — I739 Peripheral vascular disease, unspecified: Secondary | ICD-10-CM | POA: Diagnosis not present

## 2016-05-07 DIAGNOSIS — I890 Lymphedema, not elsewhere classified: Secondary | ICD-10-CM | POA: Diagnosis not present

## 2016-05-07 DIAGNOSIS — I87311 Chronic venous hypertension (idiopathic) with ulcer of right lower extremity: Secondary | ICD-10-CM | POA: Diagnosis not present

## 2016-05-07 DIAGNOSIS — I10 Essential (primary) hypertension: Secondary | ICD-10-CM | POA: Diagnosis not present

## 2016-05-07 DIAGNOSIS — L97212 Non-pressure chronic ulcer of right calf with fat layer exposed: Secondary | ICD-10-CM | POA: Diagnosis not present

## 2016-05-07 DIAGNOSIS — Z86718 Personal history of other venous thrombosis and embolism: Secondary | ICD-10-CM | POA: Diagnosis not present

## 2016-05-07 DIAGNOSIS — L97812 Non-pressure chronic ulcer of other part of right lower leg with fat layer exposed: Secondary | ICD-10-CM | POA: Diagnosis not present

## 2016-05-09 DIAGNOSIS — I890 Lymphedema, not elsewhere classified: Secondary | ICD-10-CM | POA: Diagnosis not present

## 2016-05-09 DIAGNOSIS — L97203 Non-pressure chronic ulcer of unspecified calf with necrosis of muscle: Secondary | ICD-10-CM | POA: Diagnosis not present

## 2016-05-10 ENCOUNTER — Encounter: Payer: Self-pay | Admitting: Cardiovascular Disease

## 2016-05-10 ENCOUNTER — Ambulatory Visit (INDEPENDENT_AMBULATORY_CARE_PROVIDER_SITE_OTHER): Payer: BLUE CROSS/BLUE SHIELD | Admitting: Cardiovascular Disease

## 2016-05-10 ENCOUNTER — Ambulatory Visit: Payer: BLUE CROSS/BLUE SHIELD | Admitting: Cardiovascular Disease

## 2016-05-10 VITALS — BP 121/74 | HR 71 | Ht 74.0 in | Wt >= 6400 oz

## 2016-05-10 DIAGNOSIS — I509 Heart failure, unspecified: Secondary | ICD-10-CM | POA: Diagnosis not present

## 2016-05-10 DIAGNOSIS — I10 Essential (primary) hypertension: Secondary | ICD-10-CM

## 2016-05-10 MED ORDER — FUROSEMIDE 80 MG PO TABS: 80.0000 mg | ORAL_TABLET | Freq: Two times a day (BID) | ORAL | 3 refills | Status: DC

## 2016-05-10 NOTE — Patient Instructions (Signed)
Medication Instructions:  Your physician recommends that you continue on your current medications as directed. Please refer to the Current Medication list given to you today.  Labwork: NONE  Testing/Procedures: NONE  Follow-Up: Your physician wants you to follow-up in: 4 MONTH OV You will receive a reminder letter in the mail two months in advance. If you don't receive a letter, please call our office to schedule the follow-up appointment.  If you need a refill on your cardiac medications before your next appointment, please call your pharmacy.  

## 2016-05-10 NOTE — Progress Notes (Signed)
Cardiology Office Note   Date:  05/12/2016   ID:  [name], DOB Jun 21, 1980, MRN [id number]  PCP:  Lupe Carney, MD  Cardiologist:   Chilton Si, MD   Chief Complaint  Patient presents with  . Follow-up      History of Present Illness: [name] is a 36 y.o. male with chronic heart failure type unknown, hypertension, hyperlipidemia, diabetes, morbid obesity, OSA, and venous insufficiency who presents for follow-up. [name] was admitted 1/10 through 1/26 2018 for acute heart failure exacerbation. Echocardiogram was not interpretable due to body habitus. He was diuresed with IV Lasix with a negative output of 47.2 L. His weight decreased from 631 pounds on admission to 488 pounds at discharge. His creatinine remained stable.  He followed up with [name] on 04/12/16 and was doing well. His weight was 498 pounds. His blood pressure was poorly-controlled. It was noted that carvedilol had been left off his medication list at discharge. This was restarted at that appointment. He received IV antibiotics for MSSA and Pseudomonas wound cultures.   Since his last appointment [name] has been doing well.  His BP at home has been in the 120s.  He didn't take his meds yesterday and it was 140.  He hasn't yet taken his medication today.  He notes that his breathing has been much better.  He has been walking and working with a Systems analyst three days per week.  His legs are no longer draining and healing well.  His lower extremity edema is much better.  He denies chest pain or pressure with activity.  His weight has gone down by 3 lb.     Past Medical History:  Diagnosis Date  . Chronic venous insufficiency    a. as a premature infant, required venous access for care with subsequent DVT and some sort of procedure -> eventually went on to have vein bypass around 2005.  [name] Hypertension   . Lymphedema   . Morbid obesity (HCC)   . OSA on CPAP     Past Surgical History:    Procedure Laterality Date  . balloon stenting Right    right leg  . VEIN LIGATION AND STRIPPING       Current Outpatient Prescriptions  Medication Sig Dispense Refill  . carvedilol (COREG) 12.5 MG tablet Take 1 tablet (12.5 mg total) by mouth 2 (two) times daily. 180 tablet 3  . furosemide (LASIX) 80 MG tablet Take 1 tablet (80 mg total) by mouth 2 (two) times daily. 60 tablet 3  . HYDROcodone-acetaminophen (NORCO/VICODIN) 5-325 MG tablet Take 1 tablet by mouth every 6 (six) hours as needed for moderate pain. 30 tablet 0  . ibuprofen (ADVIL,MOTRIN) 200 MG tablet Take 800 mg by mouth every 6 (six) hours as needed for moderate pain.    [name] lisinopril (PRINIVIL,ZESTRIL) 40 MG tablet Take 1 tablet (40 mg total) by mouth daily. 90 tablet 3  . potassium chloride SA (K-DUR,KLOR-CON) 20 MEQ tablet Take 40 mEq by mouth 2 (two) times daily.    [name] spironolactone (ALDACTONE) 25 MG tablet Take 1 tablet (25 mg total) by mouth daily. 90 tablet 3   No current facility-administered medications for this visit.     Allergies:   Lidocaine and Peanut-containing drug products    Social History:  The patient  reports that he has never smoked. He has never used smokeless tobacco. He reports that he drinks about 2.0 oz of alcohol per week . He  reports that he does not use drugs.   Family History:  The patient's family history includes Cancer in his father, maternal grandfather, and maternal grandmother; Diabetes in his mother.    ROS:  Please see the history of present illness.   Otherwise, review of systems are positive for none.   All other systems are reviewed and negative.    PHYSICAL EXAM: VS:  BP 121/74   Pulse 71   Ht 6\' 2"  (1.88 m)   Wt (!) 224.5 kg (495 lb)   BMI 63.55 kg/m  , BMI Body mass index is 63.55 kg/m. GENERAL:  Well appearing.  Morbidly obese. HEENT:  Pupils equal round and reactive, fundi not visualized, oral mucosa unremarkable NECK:  No jugular venous distention, waveform within  normal limits, carotid upstroke brisk and symmetric, no bruits, no thyromegaly LYMPHATICS:  No cervical adenopathy LUNGS:  Clear to auscultation bilaterally HEART:  RRR.  PMI not displaced or sustained,S1 and S2 within normal limits, no S3, no S4, no clicks, no rubs, no murmurs ABD:  Flat, positive bowel sounds normal in frequency in pitch, no bruits, no rebound, no guarding, no midline pulsatile mass, no hepatomegaly, no splenomegaly EXT:  2 plus pulses throughout, R LE in unna boot.  No LLE edema.  No cyanosis no clubbing SKIN:  No rashes no nodules NEURO:  Cranial nerves II through XII grossly intact, motor grossly intact throughout PSYCH:  Cognitively intact, oriented to person place and time   EKG:  EKG is not ordered today.   Recent Labs: 03/20/2016: ALT 22; B Natriuretic Peptide 171.0 03/22/2016: Hemoglobin 10.7; Platelets 239 03/23/2016: TSH 5.569 03/24/2016: Magnesium 1.9 04/12/2016: BUN 23; Creat 1.01; Potassium 4.4; Sodium 131    Lipid Panel No results found for: CHOL, TRIG, HDL, CHOLHDL, VLDL, LDLCALC, LDLDIRECT    Wt Readings from Last 3 Encounters:  05/10/16 (!) 224.5 kg (495 lb)  04/12/16 (!) 225.9 kg (498 lb)  04/05/16 (!) 221.4 kg (488 lb 3.2 oz)      ASSESSMENT AND PLAN:  # Chronic heart failure, type unknown: Unable to determine his classification of heart failure due to body habitus. His lower extremity edema has improved significantly.    # Hypertension: BP poorly-controlled initially.  However he just took his medication prior to arrival.  Repeat was 121/74.  Continue carvedilol, lisinopril, spironolactone and lasix.    Current medicines are reviewed at length with the patient today.  The patient does not have concerns regarding medicines.  The following changes have been made:  no change  Labs/ tests ordered today include:  No orders of the defined types were placed in this encounter.    Disposition:   FU with Zenya Hickam C. Duke Salviaandolph, MD, [facility] in 4  months.  This note was written with the assistance of speech recognition software.  Please excuse any transcriptional errors.  Signed, Tracee Mccreery C. Duke Salviaandolph, MD, [facility]  05/12/2016 8:36 AM    [facility]

## 2016-05-12 ENCOUNTER — Encounter: Payer: Self-pay | Admitting: Cardiovascular Disease

## 2016-05-13 ENCOUNTER — Telehealth: Payer: Self-pay | Admitting: Cardiovascular Disease

## 2016-05-13 DIAGNOSIS — L97203 Non-pressure chronic ulcer of unspecified calf with necrosis of muscle: Secondary | ICD-10-CM | POA: Insufficient documentation

## 2016-05-13 DIAGNOSIS — I890 Lymphedema, not elsewhere classified: Secondary | ICD-10-CM | POA: Insufficient documentation

## 2016-05-13 NOTE — Telephone Encounter (Signed)
[name] updated on pt's last visit and faxed note to him for review.

## 2016-05-13 NOTE — Telephone Encounter (Signed)
New message       Talk to the nurse to get update on last Friday's visit

## 2016-05-13 NOTE — Telephone Encounter (Signed)
[name] states to call back he is with a pt right now-will try again later

## 2016-05-14 ENCOUNTER — Encounter (HOSPITAL_BASED_OUTPATIENT_CLINIC_OR_DEPARTMENT_OTHER): Payer: BLUE CROSS/BLUE SHIELD | Attending: Surgery

## 2016-05-14 DIAGNOSIS — I890 Lymphedema, not elsewhere classified: Secondary | ICD-10-CM | POA: Diagnosis not present

## 2016-05-14 DIAGNOSIS — L97212 Non-pressure chronic ulcer of right calf with fat layer exposed: Secondary | ICD-10-CM | POA: Insufficient documentation

## 2016-05-14 DIAGNOSIS — E11622 Type 2 diabetes mellitus with other skin ulcer: Secondary | ICD-10-CM | POA: Insufficient documentation

## 2016-05-14 DIAGNOSIS — Z86718 Personal history of other venous thrombosis and embolism: Secondary | ICD-10-CM | POA: Insufficient documentation

## 2016-05-14 DIAGNOSIS — I10 Essential (primary) hypertension: Secondary | ICD-10-CM | POA: Insufficient documentation

## 2016-05-14 DIAGNOSIS — L97222 Non-pressure chronic ulcer of left calf with fat layer exposed: Secondary | ICD-10-CM | POA: Insufficient documentation

## 2016-05-14 DIAGNOSIS — I87311 Chronic venous hypertension (idiopathic) with ulcer of right lower extremity: Secondary | ICD-10-CM | POA: Insufficient documentation

## 2016-05-21 DIAGNOSIS — L97222 Non-pressure chronic ulcer of left calf with fat layer exposed: Secondary | ICD-10-CM | POA: Diagnosis not present

## 2016-05-21 DIAGNOSIS — Z86718 Personal history of other venous thrombosis and embolism: Secondary | ICD-10-CM | POA: Diagnosis not present

## 2016-05-21 DIAGNOSIS — I890 Lymphedema, not elsewhere classified: Secondary | ICD-10-CM | POA: Diagnosis not present

## 2016-05-21 DIAGNOSIS — I87311 Chronic venous hypertension (idiopathic) with ulcer of right lower extremity: Secondary | ICD-10-CM | POA: Diagnosis not present

## 2016-05-21 DIAGNOSIS — L97212 Non-pressure chronic ulcer of right calf with fat layer exposed: Secondary | ICD-10-CM | POA: Diagnosis not present

## 2016-05-21 DIAGNOSIS — I10 Essential (primary) hypertension: Secondary | ICD-10-CM | POA: Diagnosis not present

## 2016-05-21 DIAGNOSIS — E11622 Type 2 diabetes mellitus with other skin ulcer: Secondary | ICD-10-CM | POA: Diagnosis not present

## 2016-05-28 DIAGNOSIS — I10 Essential (primary) hypertension: Secondary | ICD-10-CM | POA: Diagnosis not present

## 2016-05-28 DIAGNOSIS — I872 Venous insufficiency (chronic) (peripheral): Secondary | ICD-10-CM | POA: Diagnosis not present

## 2016-05-28 DIAGNOSIS — L97212 Non-pressure chronic ulcer of right calf with fat layer exposed: Secondary | ICD-10-CM | POA: Diagnosis not present

## 2016-05-28 DIAGNOSIS — E11622 Type 2 diabetes mellitus with other skin ulcer: Secondary | ICD-10-CM | POA: Diagnosis not present

## 2016-05-28 DIAGNOSIS — I87311 Chronic venous hypertension (idiopathic) with ulcer of right lower extremity: Secondary | ICD-10-CM | POA: Diagnosis not present

## 2016-05-28 DIAGNOSIS — Z86718 Personal history of other venous thrombosis and embolism: Secondary | ICD-10-CM | POA: Diagnosis not present

## 2016-05-28 DIAGNOSIS — L97222 Non-pressure chronic ulcer of left calf with fat layer exposed: Secondary | ICD-10-CM | POA: Diagnosis not present

## 2016-05-28 DIAGNOSIS — I890 Lymphedema, not elsewhere classified: Secondary | ICD-10-CM | POA: Diagnosis not present

## 2016-06-04 DIAGNOSIS — I890 Lymphedema, not elsewhere classified: Secondary | ICD-10-CM | POA: Diagnosis not present

## 2016-06-04 DIAGNOSIS — L97212 Non-pressure chronic ulcer of right calf with fat layer exposed: Secondary | ICD-10-CM | POA: Diagnosis not present

## 2016-06-04 DIAGNOSIS — I87311 Chronic venous hypertension (idiopathic) with ulcer of right lower extremity: Secondary | ICD-10-CM | POA: Diagnosis not present

## 2016-06-04 DIAGNOSIS — E11622 Type 2 diabetes mellitus with other skin ulcer: Secondary | ICD-10-CM | POA: Diagnosis not present

## 2016-06-04 DIAGNOSIS — L97222 Non-pressure chronic ulcer of left calf with fat layer exposed: Secondary | ICD-10-CM | POA: Diagnosis not present

## 2016-06-04 DIAGNOSIS — I10 Essential (primary) hypertension: Secondary | ICD-10-CM | POA: Diagnosis not present

## 2016-06-04 DIAGNOSIS — Z86718 Personal history of other venous thrombosis and embolism: Secondary | ICD-10-CM | POA: Diagnosis not present

## 2016-06-11 ENCOUNTER — Encounter (HOSPITAL_BASED_OUTPATIENT_CLINIC_OR_DEPARTMENT_OTHER): Payer: BLUE CROSS/BLUE SHIELD | Attending: Surgery

## 2016-06-11 DIAGNOSIS — E11622 Type 2 diabetes mellitus with other skin ulcer: Secondary | ICD-10-CM | POA: Insufficient documentation

## 2016-06-11 DIAGNOSIS — I87311 Chronic venous hypertension (idiopathic) with ulcer of right lower extremity: Secondary | ICD-10-CM | POA: Diagnosis not present

## 2016-06-11 DIAGNOSIS — L97212 Non-pressure chronic ulcer of right calf with fat layer exposed: Secondary | ICD-10-CM | POA: Insufficient documentation

## 2016-06-11 DIAGNOSIS — I10 Essential (primary) hypertension: Secondary | ICD-10-CM | POA: Diagnosis not present

## 2016-06-11 DIAGNOSIS — Z86718 Personal history of other venous thrombosis and embolism: Secondary | ICD-10-CM | POA: Diagnosis not present

## 2016-06-11 DIAGNOSIS — E1151 Type 2 diabetes mellitus with diabetic peripheral angiopathy without gangrene: Secondary | ICD-10-CM | POA: Diagnosis not present

## 2016-06-11 DIAGNOSIS — I890 Lymphedema, not elsewhere classified: Secondary | ICD-10-CM | POA: Insufficient documentation

## 2016-06-18 DIAGNOSIS — L97212 Non-pressure chronic ulcer of right calf with fat layer exposed: Secondary | ICD-10-CM | POA: Diagnosis not present

## 2016-06-18 DIAGNOSIS — I87311 Chronic venous hypertension (idiopathic) with ulcer of right lower extremity: Secondary | ICD-10-CM | POA: Diagnosis not present

## 2016-06-18 DIAGNOSIS — Z86718 Personal history of other venous thrombosis and embolism: Secondary | ICD-10-CM | POA: Diagnosis not present

## 2016-06-18 DIAGNOSIS — I10 Essential (primary) hypertension: Secondary | ICD-10-CM | POA: Diagnosis not present

## 2016-06-18 DIAGNOSIS — E1151 Type 2 diabetes mellitus with diabetic peripheral angiopathy without gangrene: Secondary | ICD-10-CM | POA: Diagnosis not present

## 2016-06-18 DIAGNOSIS — E11622 Type 2 diabetes mellitus with other skin ulcer: Secondary | ICD-10-CM | POA: Diagnosis not present

## 2016-06-18 DIAGNOSIS — I890 Lymphedema, not elsewhere classified: Secondary | ICD-10-CM | POA: Diagnosis not present

## 2016-06-25 DIAGNOSIS — Z86718 Personal history of other venous thrombosis and embolism: Secondary | ICD-10-CM | POA: Diagnosis not present

## 2016-06-25 DIAGNOSIS — I87311 Chronic venous hypertension (idiopathic) with ulcer of right lower extremity: Secondary | ICD-10-CM | POA: Diagnosis not present

## 2016-06-25 DIAGNOSIS — E11622 Type 2 diabetes mellitus with other skin ulcer: Secondary | ICD-10-CM | POA: Diagnosis not present

## 2016-06-25 DIAGNOSIS — I10 Essential (primary) hypertension: Secondary | ICD-10-CM | POA: Diagnosis not present

## 2016-06-25 DIAGNOSIS — I890 Lymphedema, not elsewhere classified: Secondary | ICD-10-CM | POA: Diagnosis not present

## 2016-06-25 DIAGNOSIS — L97212 Non-pressure chronic ulcer of right calf with fat layer exposed: Secondary | ICD-10-CM | POA: Diagnosis not present

## 2016-06-25 DIAGNOSIS — E1151 Type 2 diabetes mellitus with diabetic peripheral angiopathy without gangrene: Secondary | ICD-10-CM | POA: Diagnosis not present

## 2016-07-02 DIAGNOSIS — I890 Lymphedema, not elsewhere classified: Secondary | ICD-10-CM | POA: Diagnosis not present

## 2016-07-02 DIAGNOSIS — I10 Essential (primary) hypertension: Secondary | ICD-10-CM | POA: Diagnosis not present

## 2016-07-02 DIAGNOSIS — E11622 Type 2 diabetes mellitus with other skin ulcer: Secondary | ICD-10-CM | POA: Diagnosis not present

## 2016-07-02 DIAGNOSIS — Z86718 Personal history of other venous thrombosis and embolism: Secondary | ICD-10-CM | POA: Diagnosis not present

## 2016-07-02 DIAGNOSIS — E1151 Type 2 diabetes mellitus with diabetic peripheral angiopathy without gangrene: Secondary | ICD-10-CM | POA: Diagnosis not present

## 2016-07-02 DIAGNOSIS — L97212 Non-pressure chronic ulcer of right calf with fat layer exposed: Secondary | ICD-10-CM | POA: Diagnosis not present

## 2016-07-02 DIAGNOSIS — I87311 Chronic venous hypertension (idiopathic) with ulcer of right lower extremity: Secondary | ICD-10-CM | POA: Diagnosis not present

## 2016-07-09 ENCOUNTER — Encounter (HOSPITAL_BASED_OUTPATIENT_CLINIC_OR_DEPARTMENT_OTHER): Payer: BLUE CROSS/BLUE SHIELD | Attending: Surgery

## 2016-07-09 DIAGNOSIS — E1151 Type 2 diabetes mellitus with diabetic peripheral angiopathy without gangrene: Secondary | ICD-10-CM | POA: Diagnosis not present

## 2016-07-09 DIAGNOSIS — I87311 Chronic venous hypertension (idiopathic) with ulcer of right lower extremity: Secondary | ICD-10-CM | POA: Diagnosis not present

## 2016-07-09 DIAGNOSIS — L97812 Non-pressure chronic ulcer of other part of right lower leg with fat layer exposed: Secondary | ICD-10-CM | POA: Diagnosis not present

## 2016-07-09 DIAGNOSIS — I890 Lymphedema, not elsewhere classified: Secondary | ICD-10-CM | POA: Diagnosis not present

## 2016-07-09 DIAGNOSIS — Z86718 Personal history of other venous thrombosis and embolism: Secondary | ICD-10-CM | POA: Insufficient documentation

## 2016-07-09 DIAGNOSIS — E11622 Type 2 diabetes mellitus with other skin ulcer: Secondary | ICD-10-CM | POA: Insufficient documentation

## 2016-07-09 DIAGNOSIS — L97212 Non-pressure chronic ulcer of right calf with fat layer exposed: Secondary | ICD-10-CM | POA: Diagnosis not present

## 2016-07-10 DIAGNOSIS — L97821 Non-pressure chronic ulcer of other part of left lower leg limited to breakdown of skin: Secondary | ICD-10-CM | POA: Diagnosis not present

## 2016-07-17 DIAGNOSIS — I890 Lymphedema, not elsewhere classified: Secondary | ICD-10-CM | POA: Diagnosis not present

## 2016-07-17 DIAGNOSIS — I87311 Chronic venous hypertension (idiopathic) with ulcer of right lower extremity: Secondary | ICD-10-CM | POA: Diagnosis not present

## 2016-07-17 DIAGNOSIS — L97212 Non-pressure chronic ulcer of right calf with fat layer exposed: Secondary | ICD-10-CM | POA: Diagnosis not present

## 2016-07-17 DIAGNOSIS — Z86718 Personal history of other venous thrombosis and embolism: Secondary | ICD-10-CM | POA: Diagnosis not present

## 2016-07-17 DIAGNOSIS — E1151 Type 2 diabetes mellitus with diabetic peripheral angiopathy without gangrene: Secondary | ICD-10-CM | POA: Diagnosis not present

## 2016-07-17 DIAGNOSIS — E11622 Type 2 diabetes mellitus with other skin ulcer: Secondary | ICD-10-CM | POA: Diagnosis not present

## 2016-07-17 DIAGNOSIS — L97812 Non-pressure chronic ulcer of other part of right lower leg with fat layer exposed: Secondary | ICD-10-CM | POA: Diagnosis not present

## 2016-07-23 DIAGNOSIS — L97212 Non-pressure chronic ulcer of right calf with fat layer exposed: Secondary | ICD-10-CM | POA: Diagnosis not present

## 2016-07-23 DIAGNOSIS — L97812 Non-pressure chronic ulcer of other part of right lower leg with fat layer exposed: Secondary | ICD-10-CM | POA: Diagnosis not present

## 2016-07-23 DIAGNOSIS — Z86718 Personal history of other venous thrombosis and embolism: Secondary | ICD-10-CM | POA: Diagnosis not present

## 2016-07-23 DIAGNOSIS — I890 Lymphedema, not elsewhere classified: Secondary | ICD-10-CM | POA: Diagnosis not present

## 2016-07-23 DIAGNOSIS — E11622 Type 2 diabetes mellitus with other skin ulcer: Secondary | ICD-10-CM | POA: Diagnosis not present

## 2016-07-23 DIAGNOSIS — I87311 Chronic venous hypertension (idiopathic) with ulcer of right lower extremity: Secondary | ICD-10-CM | POA: Diagnosis not present

## 2016-07-23 DIAGNOSIS — E1151 Type 2 diabetes mellitus with diabetic peripheral angiopathy without gangrene: Secondary | ICD-10-CM | POA: Diagnosis not present

## 2016-07-24 ENCOUNTER — Other Ambulatory Visit: Payer: Self-pay | Admitting: *Deleted

## 2016-07-24 MED ORDER — POTASSIUM CHLORIDE CRYS ER 20 MEQ PO TBCR: 40.0000 meq | EXTENDED_RELEASE_TABLET | Freq: Two times a day (BID) | ORAL | 3 refills | Status: DC

## 2016-07-25 ENCOUNTER — Ambulatory Visit: Payer: BLUE CROSS/BLUE SHIELD | Admitting: Physician Assistant

## 2016-07-30 DIAGNOSIS — E11622 Type 2 diabetes mellitus with other skin ulcer: Secondary | ICD-10-CM | POA: Diagnosis not present

## 2016-07-30 DIAGNOSIS — I890 Lymphedema, not elsewhere classified: Secondary | ICD-10-CM | POA: Diagnosis not present

## 2016-07-30 DIAGNOSIS — L97812 Non-pressure chronic ulcer of other part of right lower leg with fat layer exposed: Secondary | ICD-10-CM | POA: Diagnosis not present

## 2016-07-30 DIAGNOSIS — I87311 Chronic venous hypertension (idiopathic) with ulcer of right lower extremity: Secondary | ICD-10-CM | POA: Diagnosis not present

## 2016-07-30 DIAGNOSIS — E1151 Type 2 diabetes mellitus with diabetic peripheral angiopathy without gangrene: Secondary | ICD-10-CM | POA: Diagnosis not present

## 2016-07-30 DIAGNOSIS — L97212 Non-pressure chronic ulcer of right calf with fat layer exposed: Secondary | ICD-10-CM | POA: Diagnosis not present

## 2016-07-30 DIAGNOSIS — Z86718 Personal history of other venous thrombosis and embolism: Secondary | ICD-10-CM | POA: Diagnosis not present

## 2016-07-31 ENCOUNTER — Other Ambulatory Visit: Payer: Self-pay

## 2016-07-31 MED ORDER — FUROSEMIDE 80 MG PO TABS: 80.0000 mg | ORAL_TABLET | Freq: Two times a day (BID) | ORAL | 3 refills | Status: DC

## 2016-07-31 NOTE — Telephone Encounter (Signed)
Rx(s) sent to pharmacy electronically.  

## 2016-08-01 NOTE — Progress Notes (Signed)
Cardiology Office Note    Date:  08/03/2016   ID:  [name], DOB [birth year], MRN [id number]  PCP:  [name].[name], MD  Cardiologist:  Dr. Duke [name]   Chief Complaint  Patient presents with  . Follow-up    2 months    History of Present Illness:    [name] is a 36 y.o. male with past medical history of HTN, Type 2 DM, morbid obesty, OSA, chronic venous insufficiency, and chronic heart failure (unknown type as echo uninterpretable) who presents to the office today for routine follow-up.   He was admitted in 03/2016 and underwent aggressive diuresis with a net output of -47.2L with weight down from 631 lbs to 488 lbs. He was examined by Dr. Duke [name] on 05/10/2016 and reported his breathing was at baseline, Reported working with a personal trainer 3x per week. Weight was stable at 495 lbs during his office visit. He was continued on Coreg, Lisinopril, Spironolactone, and Lasix.   In talking with the patient today, he reports doing very well from a cardiac perspective. He denies any recent chest discomfort, palpitations, lightheadedness, dizziness, presyncope, orthopnea, or PND. Reports his lower extremity edema continues to improve. He does report feeling a fluid sensation going across his left pectoral region at times which occurs mostly after lifting weights.  He has been actively exercising in an effort to lose weight. He is working with a Systems analyst 3 times per week and going to the gym independently other days of the week. He is performing a variety of aerobic and weightlifting activities. He has lost over 14 pounds since his last office visit. He is also consuming a low carb diet.  Past Medical History:  Diagnosis Date  . Chronic venous insufficiency    a. as a premature infant, required venous access for care with subsequent DVT and some sort of procedure -> eventually went on to have vein bypass around 2005.  [name] Hypertension   . Lymphedema   . Morbid obesity (HCC)    . OSA on CPAP     Past Surgical History:  Procedure Laterality Date  . balloon stenting Right    right leg  . VEIN LIGATION AND STRIPPING      Current Medications: Outpatient Medications Prior to Visit  Medication Sig Dispense Refill  . carvedilol (COREG) 12.5 MG tablet Take 1 tablet (12.5 mg total) by mouth 2 (two) times daily. 180 tablet 3  . furosemide (LASIX) 80 MG tablet Take 1 tablet (80 mg total) by mouth 2 (two) times daily. 180 tablet 3  . HYDROcodone-acetaminophen (NORCO/VICODIN) 5-325 MG tablet Take 1 tablet by mouth every 6 (six) hours as needed for moderate pain. 30 tablet 0  . ibuprofen (ADVIL,MOTRIN) 200 MG tablet Take 800 mg by mouth every 6 (six) hours as needed for moderate pain.    [name] lisinopril (PRINIVIL,ZESTRIL) 40 MG tablet Take 1 tablet (40 mg total) by mouth daily. 90 tablet 3  . potassium chloride SA (K-DUR,KLOR-CON) 20 MEQ tablet Take 2 tablets (40 mEq total) by mouth 2 (two) times daily. 360 tablet 3  . spironolactone (ALDACTONE) 25 MG tablet Take 1 tablet (25 mg total) by mouth daily. 90 tablet 3   No facility-administered medications prior to visit.      Allergies:   Lidocaine and Peanut-containing drug products   Social History   Social History  . Marital status: Single    Spouse name: N/A  . Number of children: N/A  . Years of  education: N/A   Social History Main Topics  . Smoking status: Never Smoker  . Smokeless tobacco: Never Used  . Alcohol use 2.0 oz/week    4 Standard drinks or equivalent per week     Comment: liquor - pt states once a week  . Drug use: No  . Sexual activity: Not Asked   Other Topics Concern  . None   Social History Narrative  . None     Family History:  The patient's family history includes Cancer in his father, maternal grandfather, and maternal grandmother; Diabetes in his mother.   Review of Systems:   Please see the history of present illness.     General:  No chills, fever, night sweats or weight  changes.  Cardiovascular:  No chest pain, dyspnea on exertion, orthopnea, palpitations, paroxysmal nocturnal dyspnea. Positive for edema.  Dermatological: No rash, lesions/masses Respiratory: No cough, dyspnea Urologic: No hematuria, dysuria Abdominal:   No nausea, vomiting, diarrhea, bright red blood per rectum, melena, or hematemesis Neurologic:  No visual changes, wkns, changes in mental status.  All other systems reviewed and are otherwise negative except as noted above.   Physical Exam:    VS:  BP (!) 136/94   Pulse 84   Ht 6\' 2"  (1.88 m)   Wt (!) 481 lb (218.2 kg)   BMI 61.76 kg/m    General: Well developed, morbidly obese African American male appearing in no acute distress. Head: Normocephalic, atraumatic, sclera non-icteric, no xanthomas, nares are without discharge.  Neck: No carotid bruits. JVD unable to be assessed secondary to body habitus..  Lungs: Respirations regular and unlabored, without wheezes or rales.  Heart: Regular rate and rhythm. No S3 or S4.  No murmur, no rubs, or gallops appreciated. Abdomen: Soft, non-tender, non-distended with normoactive bowel sounds. No hepatomegaly. No rebound/guarding. No obvious abdominal masses. Msk:  Strength and tone appear normal for age. No joint deformities or effusions. Extremities: No clubbing or cyanosis. Chronic lower extremity edema.  Distal pedal pulses are 2+ bilaterally. Neuro: Alert and oriented X 3. Moves all extremities spontaneously. No focal deficits noted. Psych:  Responds to questions appropriately with a normal affect. Skin: No rashes or lesions noted  Wt Readings from Last 3 Encounters:  08/02/16 (!) 481 lb (218.2 kg)  05/10/16 (!) 495 lb (224.5 kg)  04/12/16 (!) 498 lb (225.9 kg)      Studies/Labs Reviewed:   EKG:  EKG is not ordered today.  Recent Labs: 03/20/2016: ALT 22; B Natriuretic Peptide 171.0 03/22/2016: Hemoglobin 10.7; Platelets 239 03/23/2016: TSH 5.569 03/24/2016: Magnesium  1.9 04/12/2016: BUN 23; Creat 1.01; Potassium 4.4; Sodium 131   Lipid Panel No results found for: CHOL, TRIG, HDL, CHOLHDL, VLDL, LDLCALC, LDLDIRECT  Additional studies/ records that were reviewed today include:   Echocardiogram: 03/2016 Impressions:  - Uninterpretable study, even after Definity contrast   administration.  Assessment:    1. Chronic congestive heart failure, unspecified heart failure type (HCC)   2. Essential hypertension   3. Morbid obesity (HCC)   4. OSA (obstructive sleep apnea)      Plan:   In order of problems listed above:  1. Chronic CHF - unspecified type, as echocardiogram was uninterpretable. - the patient reports doing very well from a cardiac perspective and denies any recent chest discomfort, palpitations, lightheadedness, dizziness, presyncope, orthopnea, or PND. His edema continues to improve. Reports labs were recently checked by his PCP and were normal at that time. - Will continue on Coreg 12.5mg   BID, Lasix 80mg  BID, Lisinopril 40mg  dialy, and Spironolactone 25mg  daily. Continued weight loss encouraged.   2. HTN - BP initially elevated at 144/100, at 136/94 on recheck. Reported coming straight from the gym to his appointment. Reports readings in the 120's - 130's/ 80's when checked at home. - continue current medication regimen.  3. Morbid Obesity - He is actively trying to lose weight by going to the gym daily and consuming a low carbohydrate diet. Weight has decreased by 14 pounds since his last office visit. He was congratulated on this with continued weight loss encouraged.  4. OSA - continue CPAP.   Medication Adjustments/Labs and Tests Ordered: Current medicines are reviewed at length with the patient today.  Concerns regarding medicines are outlined above.  Medication changes, Labs and Tests ordered today are listed in the Patient Instructions below. Patient Instructions  Medication Instructions:  Your physician recommends that you  continue on your current medications as directed. Please refer to the Current Medication list given to you today.   Follow-Up: Your physician recommends that you schedule a follow-up appointment in: 3 months with Dr. Duke Salviaandolph.   Any Other Special Instructions Will Be Listed Below (If Applicable).   If you need a refill on your cardiac medications before your next appointment, please call your pharmacy.   Signed, Ellsworth LennoxBrittany M Kikue Gerhart, PA-C  08/03/2016 11:11 AM    [facility] [address] [place], [place]  [address] Phone: [phone]; Fax: [phone]  [address], [address] [place], [place] [address] Phone: [phone]

## 2016-08-02 ENCOUNTER — Encounter: Payer: Self-pay | Admitting: Student

## 2016-08-02 ENCOUNTER — Ambulatory Visit (INDEPENDENT_AMBULATORY_CARE_PROVIDER_SITE_OTHER): Payer: BLUE CROSS/BLUE SHIELD | Admitting: Student

## 2016-08-02 VITALS — BP 136/94 | HR 84 | Ht 74.0 in | Wt >= 6400 oz

## 2016-08-02 DIAGNOSIS — I10 Essential (primary) hypertension: Secondary | ICD-10-CM | POA: Diagnosis not present

## 2016-08-02 DIAGNOSIS — G4733 Obstructive sleep apnea (adult) (pediatric): Secondary | ICD-10-CM | POA: Diagnosis not present

## 2016-08-02 DIAGNOSIS — I509 Heart failure, unspecified: Secondary | ICD-10-CM

## 2016-08-02 NOTE — Patient Instructions (Signed)
Medication Instructions:  Your physician recommends that you continue on your current medications as directed. Please refer to the Current Medication list given to you today.  Follow-Up: Your physician recommends that you schedule a follow-up appointment in: 3 months with Dr. [place].   Any Other Special Instructions Will Be Listed Below (If Applicable).     If you need a refill on your cardiac medications before your next appointment, please call your pharmacy.   

## 2016-08-06 DIAGNOSIS — E1151 Type 2 diabetes mellitus with diabetic peripheral angiopathy without gangrene: Secondary | ICD-10-CM | POA: Diagnosis not present

## 2016-08-06 DIAGNOSIS — I87311 Chronic venous hypertension (idiopathic) with ulcer of right lower extremity: Secondary | ICD-10-CM | POA: Diagnosis not present

## 2016-08-06 DIAGNOSIS — E11622 Type 2 diabetes mellitus with other skin ulcer: Secondary | ICD-10-CM | POA: Diagnosis not present

## 2016-08-06 DIAGNOSIS — I890 Lymphedema, not elsewhere classified: Secondary | ICD-10-CM | POA: Diagnosis not present

## 2016-08-06 DIAGNOSIS — Z86718 Personal history of other venous thrombosis and embolism: Secondary | ICD-10-CM | POA: Diagnosis not present

## 2016-08-06 DIAGNOSIS — L97812 Non-pressure chronic ulcer of other part of right lower leg with fat layer exposed: Secondary | ICD-10-CM | POA: Diagnosis not present

## 2016-08-06 DIAGNOSIS — L97212 Non-pressure chronic ulcer of right calf with fat layer exposed: Secondary | ICD-10-CM | POA: Diagnosis not present

## 2016-08-12 ENCOUNTER — Ambulatory Visit (INDEPENDENT_AMBULATORY_CARE_PROVIDER_SITE_OTHER): Payer: BLUE CROSS/BLUE SHIELD | Admitting: Podiatry

## 2016-08-12 ENCOUNTER — Encounter: Payer: Self-pay | Admitting: Podiatry

## 2016-08-12 DIAGNOSIS — B351 Tinea unguium: Secondary | ICD-10-CM | POA: Diagnosis not present

## 2016-08-12 DIAGNOSIS — M79676 Pain in unspecified toe(s): Secondary | ICD-10-CM

## 2016-08-12 DIAGNOSIS — E0842 Diabetes mellitus due to underlying condition with diabetic polyneuropathy: Secondary | ICD-10-CM | POA: Diagnosis not present

## 2016-08-12 DIAGNOSIS — Z79899 Other long term (current) drug therapy: Secondary | ICD-10-CM

## 2016-08-12 NOTE — Progress Notes (Signed)
   SUBJECTIVE Patient with a history of diabetes mellitus presents to office today complaining of elongated, thickened nails. Pain while ambulating in shoes. Patient is unable to trim their own nails.   OBJECTIVE General Patient is awake, alert, and oriented x 3 and in no acute distress. Derm Hyperkeratotic, discolored, thickened, onychodystrophy of nails noted bilaterally. Skin is dry and supple bilateral. Negative open lesions or macerations. Remaining integument unremarkable. Nails are tender, Takahashi, thickened and dystrophic with subungual debris, consistent with onychomycosis, 1-5 bilateral. No signs of infection noted. Vasc  DP and PT pedal pulses palpable bilaterally. Temperature gradient within normal limits.  Neuro Epicritic and protective threshold sensation diminished bilaterally.  Musculoskeletal Exam No symptomatic pedal deformities noted bilateral. Muscular strength within normal limits.  ASSESSMENT 1. Diabetes Mellitus w/ peripheral neuropathy 2. Onychomycosis of nail due to dermatophyte bilateral 3. Pain in foot bilateral  PLAN OF CARE 1. Patient evaluated today. 2. Instructed to maintain good pedal hygiene and foot care. Stressed importance of controlling blood sugar.  3. Mechanical debridement of nails 1-5 bilaterally performed using a nail nipper. Filed with dremel without incident.  4. Today nail biopsy was taken and sent to pathology for fungal culture. 5. Orders for liver function tests were ordered today. 6. Will prescription for Lamisil No. 90 if patient is positive for fungus on nail biopsy results. 7. Return to clinic when necessary.     Felecia ShellingBrent M. Merced Brougham, DPM [facility]  Dr. Felecia ShellingBrent M. Awanda Wilcock, DPM    [address]                                        [place], [place] [address]                Office [phone]  Fax [phone]

## 2016-08-13 ENCOUNTER — Encounter (HOSPITAL_BASED_OUTPATIENT_CLINIC_OR_DEPARTMENT_OTHER): Payer: BLUE CROSS/BLUE SHIELD | Attending: Surgery

## 2016-08-13 DIAGNOSIS — L97212 Non-pressure chronic ulcer of right calf with fat layer exposed: Secondary | ICD-10-CM | POA: Insufficient documentation

## 2016-08-13 DIAGNOSIS — E11622 Type 2 diabetes mellitus with other skin ulcer: Secondary | ICD-10-CM | POA: Insufficient documentation

## 2016-08-13 DIAGNOSIS — E1151 Type 2 diabetes mellitus with diabetic peripheral angiopathy without gangrene: Secondary | ICD-10-CM | POA: Insufficient documentation

## 2016-08-13 DIAGNOSIS — Z86718 Personal history of other venous thrombosis and embolism: Secondary | ICD-10-CM | POA: Diagnosis not present

## 2016-08-13 DIAGNOSIS — I10 Essential (primary) hypertension: Secondary | ICD-10-CM | POA: Insufficient documentation

## 2016-08-13 DIAGNOSIS — I890 Lymphedema, not elsewhere classified: Secondary | ICD-10-CM | POA: Insufficient documentation

## 2016-08-13 DIAGNOSIS — I87311 Chronic venous hypertension (idiopathic) with ulcer of right lower extremity: Secondary | ICD-10-CM | POA: Insufficient documentation

## 2016-08-20 DIAGNOSIS — I890 Lymphedema, not elsewhere classified: Secondary | ICD-10-CM | POA: Diagnosis not present

## 2016-08-20 DIAGNOSIS — I10 Essential (primary) hypertension: Secondary | ICD-10-CM | POA: Diagnosis not present

## 2016-08-20 DIAGNOSIS — I87311 Chronic venous hypertension (idiopathic) with ulcer of right lower extremity: Secondary | ICD-10-CM | POA: Diagnosis not present

## 2016-08-20 DIAGNOSIS — E11622 Type 2 diabetes mellitus with other skin ulcer: Secondary | ICD-10-CM | POA: Diagnosis not present

## 2016-08-20 DIAGNOSIS — Z86718 Personal history of other venous thrombosis and embolism: Secondary | ICD-10-CM | POA: Diagnosis not present

## 2016-08-20 DIAGNOSIS — L97212 Non-pressure chronic ulcer of right calf with fat layer exposed: Secondary | ICD-10-CM | POA: Diagnosis not present

## 2016-08-20 DIAGNOSIS — E1151 Type 2 diabetes mellitus with diabetic peripheral angiopathy without gangrene: Secondary | ICD-10-CM | POA: Diagnosis not present

## 2016-08-27 DIAGNOSIS — E1151 Type 2 diabetes mellitus with diabetic peripheral angiopathy without gangrene: Secondary | ICD-10-CM | POA: Diagnosis not present

## 2016-08-27 DIAGNOSIS — Z86718 Personal history of other venous thrombosis and embolism: Secondary | ICD-10-CM | POA: Diagnosis not present

## 2016-08-27 DIAGNOSIS — E11622 Type 2 diabetes mellitus with other skin ulcer: Secondary | ICD-10-CM | POA: Diagnosis not present

## 2016-08-27 DIAGNOSIS — L97212 Non-pressure chronic ulcer of right calf with fat layer exposed: Secondary | ICD-10-CM | POA: Diagnosis not present

## 2016-08-27 DIAGNOSIS — I87311 Chronic venous hypertension (idiopathic) with ulcer of right lower extremity: Secondary | ICD-10-CM | POA: Diagnosis not present

## 2016-08-27 DIAGNOSIS — I890 Lymphedema, not elsewhere classified: Secondary | ICD-10-CM | POA: Diagnosis not present

## 2016-08-27 DIAGNOSIS — I10 Essential (primary) hypertension: Secondary | ICD-10-CM | POA: Diagnosis not present

## 2016-09-03 DIAGNOSIS — E11622 Type 2 diabetes mellitus with other skin ulcer: Secondary | ICD-10-CM | POA: Diagnosis not present

## 2016-09-03 DIAGNOSIS — E1151 Type 2 diabetes mellitus with diabetic peripheral angiopathy without gangrene: Secondary | ICD-10-CM | POA: Diagnosis not present

## 2016-09-03 DIAGNOSIS — Z86718 Personal history of other venous thrombosis and embolism: Secondary | ICD-10-CM | POA: Diagnosis not present

## 2016-09-03 DIAGNOSIS — I10 Essential (primary) hypertension: Secondary | ICD-10-CM | POA: Diagnosis not present

## 2016-09-03 DIAGNOSIS — I87311 Chronic venous hypertension (idiopathic) with ulcer of right lower extremity: Secondary | ICD-10-CM | POA: Diagnosis not present

## 2016-09-03 DIAGNOSIS — I890 Lymphedema, not elsewhere classified: Secondary | ICD-10-CM | POA: Diagnosis not present

## 2016-09-03 DIAGNOSIS — L97212 Non-pressure chronic ulcer of right calf with fat layer exposed: Secondary | ICD-10-CM | POA: Diagnosis not present

## 2016-09-10 ENCOUNTER — Other Ambulatory Visit (HOSPITAL_COMMUNITY)
Admission: RE | Admit: 2016-09-10 | Discharge: 2016-09-10 | Disposition: A | Discharge status: Home / Self Care | Payer: BLUE CROSS/BLUE SHIELD | Source: Other Acute Inpatient Hospital | Attending: Surgery | Admitting: Surgery

## 2016-09-10 ENCOUNTER — Encounter (HOSPITAL_BASED_OUTPATIENT_CLINIC_OR_DEPARTMENT_OTHER): Payer: BLUE CROSS/BLUE SHIELD | Attending: Surgery

## 2016-09-10 DIAGNOSIS — E11622 Type 2 diabetes mellitus with other skin ulcer: Secondary | ICD-10-CM | POA: Diagnosis not present

## 2016-09-10 DIAGNOSIS — L97212 Non-pressure chronic ulcer of right calf with fat layer exposed: Secondary | ICD-10-CM | POA: Diagnosis not present

## 2016-09-10 DIAGNOSIS — E1151 Type 2 diabetes mellitus with diabetic peripheral angiopathy without gangrene: Secondary | ICD-10-CM | POA: Insufficient documentation

## 2016-09-10 DIAGNOSIS — I87311 Chronic venous hypertension (idiopathic) with ulcer of right lower extremity: Secondary | ICD-10-CM | POA: Insufficient documentation

## 2016-09-10 DIAGNOSIS — I890 Lymphedema, not elsewhere classified: Secondary | ICD-10-CM | POA: Diagnosis not present

## 2016-09-10 DIAGNOSIS — Z86718 Personal history of other venous thrombosis and embolism: Secondary | ICD-10-CM | POA: Insufficient documentation

## 2016-09-10 DIAGNOSIS — I10 Essential (primary) hypertension: Secondary | ICD-10-CM | POA: Insufficient documentation

## 2016-09-10 DIAGNOSIS — L97821 Non-pressure chronic ulcer of other part of left lower leg limited to breakdown of skin: Secondary | ICD-10-CM | POA: Diagnosis not present

## 2016-09-15 LAB — AEROBIC/ANAEROBIC CULTURE W GRAM STAIN (SURGICAL/DEEP WOUND)

## 2016-09-15 LAB — AEROBIC/ANAEROBIC CULTURE (SURGICAL/DEEP WOUND)

## 2016-09-18 DIAGNOSIS — E1151 Type 2 diabetes mellitus with diabetic peripheral angiopathy without gangrene: Secondary | ICD-10-CM | POA: Diagnosis not present

## 2016-09-18 DIAGNOSIS — L97212 Non-pressure chronic ulcer of right calf with fat layer exposed: Secondary | ICD-10-CM | POA: Diagnosis not present

## 2016-09-18 DIAGNOSIS — Z86718 Personal history of other venous thrombosis and embolism: Secondary | ICD-10-CM | POA: Diagnosis not present

## 2016-09-18 DIAGNOSIS — I890 Lymphedema, not elsewhere classified: Secondary | ICD-10-CM | POA: Diagnosis not present

## 2016-09-18 DIAGNOSIS — I87311 Chronic venous hypertension (idiopathic) with ulcer of right lower extremity: Secondary | ICD-10-CM | POA: Diagnosis not present

## 2016-09-18 DIAGNOSIS — E11622 Type 2 diabetes mellitus with other skin ulcer: Secondary | ICD-10-CM | POA: Diagnosis not present

## 2016-09-18 DIAGNOSIS — I10 Essential (primary) hypertension: Secondary | ICD-10-CM | POA: Diagnosis not present

## 2016-09-24 DIAGNOSIS — L97821 Non-pressure chronic ulcer of other part of left lower leg limited to breakdown of skin: Secondary | ICD-10-CM | POA: Diagnosis not present

## 2016-09-24 DIAGNOSIS — I10 Essential (primary) hypertension: Secondary | ICD-10-CM | POA: Diagnosis not present

## 2016-09-24 DIAGNOSIS — I890 Lymphedema, not elsewhere classified: Secondary | ICD-10-CM | POA: Diagnosis not present

## 2016-09-24 DIAGNOSIS — E1151 Type 2 diabetes mellitus with diabetic peripheral angiopathy without gangrene: Secondary | ICD-10-CM | POA: Diagnosis not present

## 2016-09-24 DIAGNOSIS — I87311 Chronic venous hypertension (idiopathic) with ulcer of right lower extremity: Secondary | ICD-10-CM | POA: Diagnosis not present

## 2016-09-24 DIAGNOSIS — L97212 Non-pressure chronic ulcer of right calf with fat layer exposed: Secondary | ICD-10-CM | POA: Diagnosis not present

## 2016-09-24 DIAGNOSIS — Z86718 Personal history of other venous thrombosis and embolism: Secondary | ICD-10-CM | POA: Diagnosis not present

## 2016-09-24 DIAGNOSIS — E11622 Type 2 diabetes mellitus with other skin ulcer: Secondary | ICD-10-CM | POA: Diagnosis not present

## 2016-10-01 DIAGNOSIS — Z86718 Personal history of other venous thrombosis and embolism: Secondary | ICD-10-CM | POA: Diagnosis not present

## 2016-10-01 DIAGNOSIS — E11622 Type 2 diabetes mellitus with other skin ulcer: Secondary | ICD-10-CM | POA: Diagnosis not present

## 2016-10-01 DIAGNOSIS — I87311 Chronic venous hypertension (idiopathic) with ulcer of right lower extremity: Secondary | ICD-10-CM | POA: Diagnosis not present

## 2016-10-01 DIAGNOSIS — I890 Lymphedema, not elsewhere classified: Secondary | ICD-10-CM | POA: Diagnosis not present

## 2016-10-01 DIAGNOSIS — E1151 Type 2 diabetes mellitus with diabetic peripheral angiopathy without gangrene: Secondary | ICD-10-CM | POA: Diagnosis not present

## 2016-10-01 DIAGNOSIS — L97212 Non-pressure chronic ulcer of right calf with fat layer exposed: Secondary | ICD-10-CM | POA: Diagnosis not present

## 2016-10-01 DIAGNOSIS — I10 Essential (primary) hypertension: Secondary | ICD-10-CM | POA: Diagnosis not present

## 2016-10-08 DIAGNOSIS — N529 Male erectile dysfunction, unspecified: Secondary | ICD-10-CM | POA: Diagnosis not present

## 2016-10-08 DIAGNOSIS — R7303 Prediabetes: Secondary | ICD-10-CM | POA: Diagnosis not present

## 2016-10-08 DIAGNOSIS — I10 Essential (primary) hypertension: Secondary | ICD-10-CM | POA: Diagnosis not present

## 2016-10-15 ENCOUNTER — Encounter (HOSPITAL_BASED_OUTPATIENT_CLINIC_OR_DEPARTMENT_OTHER): Payer: BLUE CROSS/BLUE SHIELD | Attending: Surgery

## 2016-10-15 DIAGNOSIS — L97212 Non-pressure chronic ulcer of right calf with fat layer exposed: Secondary | ICD-10-CM | POA: Diagnosis not present

## 2016-10-15 DIAGNOSIS — E11622 Type 2 diabetes mellitus with other skin ulcer: Secondary | ICD-10-CM | POA: Diagnosis not present

## 2016-10-15 DIAGNOSIS — I10 Essential (primary) hypertension: Secondary | ICD-10-CM | POA: Insufficient documentation

## 2016-10-15 DIAGNOSIS — I890 Lymphedema, not elsewhere classified: Secondary | ICD-10-CM | POA: Diagnosis not present

## 2016-10-15 DIAGNOSIS — I87311 Chronic venous hypertension (idiopathic) with ulcer of right lower extremity: Secondary | ICD-10-CM | POA: Diagnosis not present

## 2016-10-15 DIAGNOSIS — Z86718 Personal history of other venous thrombosis and embolism: Secondary | ICD-10-CM | POA: Insufficient documentation

## 2016-10-22 DIAGNOSIS — I890 Lymphedema, not elsewhere classified: Secondary | ICD-10-CM | POA: Diagnosis not present

## 2016-10-22 DIAGNOSIS — L97212 Non-pressure chronic ulcer of right calf with fat layer exposed: Secondary | ICD-10-CM | POA: Diagnosis not present

## 2016-10-22 DIAGNOSIS — I87311 Chronic venous hypertension (idiopathic) with ulcer of right lower extremity: Secondary | ICD-10-CM | POA: Diagnosis not present

## 2016-10-22 DIAGNOSIS — E11622 Type 2 diabetes mellitus with other skin ulcer: Secondary | ICD-10-CM | POA: Diagnosis not present

## 2016-10-22 DIAGNOSIS — I10 Essential (primary) hypertension: Secondary | ICD-10-CM | POA: Diagnosis not present

## 2016-10-22 DIAGNOSIS — Z86718 Personal history of other venous thrombosis and embolism: Secondary | ICD-10-CM | POA: Diagnosis not present

## 2016-10-29 DIAGNOSIS — Z86718 Personal history of other venous thrombosis and embolism: Secondary | ICD-10-CM | POA: Diagnosis not present

## 2016-10-29 DIAGNOSIS — I890 Lymphedema, not elsewhere classified: Secondary | ICD-10-CM | POA: Diagnosis not present

## 2016-10-29 DIAGNOSIS — E11622 Type 2 diabetes mellitus with other skin ulcer: Secondary | ICD-10-CM | POA: Diagnosis not present

## 2016-10-29 DIAGNOSIS — L97821 Non-pressure chronic ulcer of other part of left lower leg limited to breakdown of skin: Secondary | ICD-10-CM | POA: Diagnosis not present

## 2016-10-29 DIAGNOSIS — I10 Essential (primary) hypertension: Secondary | ICD-10-CM | POA: Diagnosis not present

## 2016-10-29 DIAGNOSIS — I87311 Chronic venous hypertension (idiopathic) with ulcer of right lower extremity: Secondary | ICD-10-CM | POA: Diagnosis not present

## 2016-10-29 DIAGNOSIS — L97212 Non-pressure chronic ulcer of right calf with fat layer exposed: Secondary | ICD-10-CM | POA: Diagnosis not present

## 2016-11-05 ENCOUNTER — Encounter: Payer: Self-pay | Admitting: Cardiovascular Disease

## 2016-11-05 ENCOUNTER — Ambulatory Visit (INDEPENDENT_AMBULATORY_CARE_PROVIDER_SITE_OTHER): Payer: BLUE CROSS/BLUE SHIELD | Admitting: Cardiovascular Disease

## 2016-11-05 VITALS — BP 152/94 | HR 72 | Ht 74.0 in | Wt >= 6400 oz

## 2016-11-05 DIAGNOSIS — I110 Hypertensive heart disease with heart failure: Secondary | ICD-10-CM | POA: Diagnosis not present

## 2016-11-05 DIAGNOSIS — E11622 Type 2 diabetes mellitus with other skin ulcer: Secondary | ICD-10-CM | POA: Diagnosis not present

## 2016-11-05 DIAGNOSIS — I10 Essential (primary) hypertension: Secondary | ICD-10-CM | POA: Diagnosis not present

## 2016-11-05 DIAGNOSIS — I509 Heart failure, unspecified: Secondary | ICD-10-CM

## 2016-11-05 DIAGNOSIS — Z5181 Encounter for therapeutic drug level monitoring: Secondary | ICD-10-CM

## 2016-11-05 DIAGNOSIS — Z86718 Personal history of other venous thrombosis and embolism: Secondary | ICD-10-CM | POA: Diagnosis not present

## 2016-11-05 DIAGNOSIS — L97212 Non-pressure chronic ulcer of right calf with fat layer exposed: Secondary | ICD-10-CM | POA: Diagnosis not present

## 2016-11-05 DIAGNOSIS — I890 Lymphedema, not elsewhere classified: Secondary | ICD-10-CM | POA: Diagnosis not present

## 2016-11-05 DIAGNOSIS — I87311 Chronic venous hypertension (idiopathic) with ulcer of right lower extremity: Secondary | ICD-10-CM | POA: Diagnosis not present

## 2016-11-05 NOTE — Patient Instructions (Signed)
Medication Instructions:  Your physician recommends that you continue on your current medications as directed. Please refer to the Current Medication list given to you today.  Labwork: BMET TODAY   Testing/Procedures: NONE  Follow-Up: Your physician recommends that you schedule a follow-up appointment in: 4 MONTH OV  If you need a refill on your cardiac medications before your next appointment, please call your pharmacy.  

## 2016-11-05 NOTE — Progress Notes (Signed)
Cardiology Office Note   Date:  11/05/2016   ID:  [name], DOB 06-Jun-1980, MRN [id number]  PCP:  Asencion Gowda.[name], MD  Cardiologist:   Chilton Si, MD   Chief Complaint  Patient presents with  . Follow-up    3 months; Pt states no Sx.      History of Present Illness: [name] is a 36 y.o. male with chronic heart failure type unknown, hypertension, hyperlipidemia, diabetes, morbid obesity, OSA, and venous insufficiency who presents for follow-up. [name] was admitted 1/10 through 1/26 2018 for acute heart failure exacerbation. Echocardiogram was not interpretable due to body habitus. He was diuresed with IV Lasix with a negative output of 47.2 L. His weight decreased from 631 pounds on admission to 488 pounds at discharge. His creatinine remained stable.  He followed up with [name] on 04/12/16 and was doing well. His weight was 498 pounds. His blood pressure was poorly-controlled. It was noted that carvedilol had been left off his medication list at discharge. This was restarted at that appointment. He followed up with [name] and was doing well. He was exercising and working out with a Systems analyst 3 times per week. on 07/2016.  His blood pressure was slightly elevated at that appointment, but he had come straight from the gym, no changes were made.   Since his last appointment [name] has been feeling well.  He Denies any chest pain or shortness of breath. His breathing has improved significantly.  He has been exercising with a trainer 3-4 days per week and has no exertional symptoms. His right lower extremity has almost healed. He denies any left lower extremity edema, orthopnea, or PND.  He previously had occasional heart palpitations. However he is not experiences in the last one or 2 months. The episodes lasted for less than 1 minute at a time and were not associated with lightheadedness, dizziness, chest pain, or shortness of breath. He checks his  blood pressure at home and it typically is in the 110s over 60s to 70s. He took medication weight today.  No fluttering lately non in last 1-2 months/.   Past Medical History:  Diagnosis Date  . Chronic venous insufficiency    a. as a premature infant, required venous access for care with subsequent DVT and some sort of procedure -> eventually went on to have vein bypass around 2005.  [name] Hypertension   . Lymphedema   . Morbid obesity (HCC)   . OSA on CPAP     Past Surgical History:  Procedure Laterality Date  . balloon stenting Right    right leg  . VEIN LIGATION AND STRIPPING       Current Outpatient Prescriptions  Medication Sig Dispense Refill  . carvedilol (COREG) 12.5 MG tablet Take 1 tablet (12.5 mg total) by mouth 2 (two) times daily. 180 tablet 3  . furosemide (LASIX) 80 MG tablet Take 1 tablet (80 mg total) by mouth 2 (two) times daily. 180 tablet 3  . HYDROcodone-acetaminophen (NORCO/VICODIN) 5-325 MG tablet Take 1 tablet by mouth every 6 (six) hours as needed for moderate pain. 30 tablet 0  . ibuprofen (ADVIL,MOTRIN) 200 MG tablet Take 800 mg by mouth every 6 (six) hours as needed for moderate pain.    [name] lisinopril (PRINIVIL,ZESTRIL) 40 MG tablet Take 1 tablet (40 mg total) by mouth daily. 90 tablet 3  . potassium chloride SA (K-DUR,KLOR-CON) 20 MEQ tablet Take 2 tablets (40 mEq total) by mouth  2 (two) times daily. 360 tablet 3  . spironolactone (ALDACTONE) 25 MG tablet Take 1 tablet (25 mg total) by mouth daily. 90 tablet 3   No current facility-administered medications for this visit.     Allergies:   Lidocaine and Peanut-containing drug products    Social History:  The patient  reports that he has never smoked. He has never used smokeless tobacco. He reports that he drinks about 2.0 oz of alcohol per week . He reports that he does not use drugs.   Family History:  The patient's family history includes Cancer in his father, maternal grandfather, and maternal  grandmother; Diabetes in his mother.    ROS:  Please see the history of present illness.   Otherwise, review of systems are positive for none.   All other systems are reviewed and negative.    PHYSICAL EXAM: VS:  BP (!) 152/94   Pulse 72   Ht 6\' 2"  (1.88 m)   Wt (!) 218.9 kg (482 lb 9.6 oz)   BMI 61.96 kg/m  , BMI Body mass index is 61.96 kg/m. GENERAL:  Well appearing.  Morbidly obese. HEENT:  Pupils equal round and reactive, fundi not visualized, oral mucosa unremarkable NECK:  No jugular venous distention, waveform within normal limits, carotid upstroke brisk and symmetric, no bruits LUNGS:  Clear to auscultation bilaterally.  No crackles, wheezes or rhonchi HEART:  RRR.  Distant heart sounds.  PMI not displaced or sustained,S1 and S2 within normal limits, no S3, no S4, no clicks, no rubs, no murmurs ABD:  Flat, positive bowel sounds normal in frequency in pitch, no bruits, no rebound, no guarding, no midline pulsatile mass, no hepatomegaly, no splenomegaly EXT:  2 plus pulses throughout, R LE in unna boot.  No LLE edema.  No cyanosis no clubbing SKIN:  No rashes no nodules NEURO:  Cranial nerves II through XII grossly intact, motor grossly intact throughout PSYCH:  Cognitively intact, oriented to person place and time  EKG:  EKG is ordered today. 11/05/16: Sinus rhythm.  Rate 72 bpm.  Low voltage.   Recent Labs: 03/20/2016: ALT 22; B Natriuretic Peptide 171.0 03/22/2016: Hemoglobin 10.7; Platelets 239 03/23/2016: TSH 5.569 03/24/2016: Magnesium 1.9 04/12/2016: BUN 23; Creat 1.01; Potassium 4.4; Sodium 131    Lipid Panel No results found for: CHOL, TRIG, HDL, CHOLHDL, VLDL, LDLCALC, LDLDIRECT    Wt Readings from Last 3 Encounters:  11/05/16 (!) 218.9 kg (482 lb 9.6 oz)  08/02/16 (!) 218.2 kg (481 lb)  05/10/16 (!) 224.5 kg (495 lb)      ASSESSMENT AND PLAN:  # Chronic heart failure, type unknown: Unable to determine his classification of heart failure due to body habitus.  His lower extremity edema has improved significantly.  He is doing well and has no edema.  We will check a BMP.    # Hypertension: BP poorly-controlled initially but better on repeat.  He reports good BP control at home.  Continue carvedilol, lasix, lisinopril and spironolactone.  We talked about the importance of taking his medication at the same time daily.    Current medicines are reviewed at length with the patient today.  The patient does not have concerns regarding medicines.  The following changes have been made:  no change  Labs/ tests ordered today include:   Orders Placed This Encounter  Procedures  . Basic metabolic panel     Disposition:   FU with Robt Okuda C. Duke Salvia, MD, [facility] in 4 months.  This note was  written with the assistance of speech recognition software.  Please excuse any transcriptional errors.  Signed, Ellin Fitzgibbons C. Duke Salvia, MD, [facility]  11/05/2016 3:43 PM    [facility]

## 2016-11-06 ENCOUNTER — Telehealth: Payer: Self-pay | Admitting: Cardiovascular Disease

## 2016-11-06 LAB — BASIC METABOLIC PANEL
BUN / CREAT RATIO: 12 (ref 9–20)
BUN: 10 mg/dL (ref 6–20)
CHLORIDE: 100 mmol/L (ref 96–106)
CO2: 24 mmol/L (ref 20–29)
Calcium: 9.2 mg/dL (ref 8.7–10.2)
Creatinine, Ser: 0.84 mg/dL (ref 0.76–1.27)
GFR, EST AFRICAN AMERICAN: 130 mL/min/{1.73_m2} (ref >59)
GFR, EST NON AFRICAN AMERICAN: 113 mL/min/{1.73_m2} (ref >59)
Glucose: 105 mg/dL — ABNORMAL HIGH (ref 65–99)
POTASSIUM: 3.9 mmol/L (ref 3.5–5.2)
SODIUM: 141 mmol/L (ref 134–144)

## 2016-11-06 MED ORDER — SPIRONOLACTONE 25 MG PO TABS: 25.0000 mg | ORAL_TABLET | Freq: Every day | ORAL | 3 refills | Status: DC

## 2016-11-06 MED ORDER — FUROSEMIDE 80 MG PO TABS: 80.0000 mg | ORAL_TABLET | Freq: Two times a day (BID) | ORAL | 12 refills | Status: DC

## 2016-11-06 NOTE — Telephone Encounter (Signed)
Refill sent to the pharmacy electronically.  

## 2016-11-06 NOTE — Telephone Encounter (Signed)
°*  STAT* If patient is at the pharmacy, call can be transferred to refill team.   1. Which medications need to be refilled? (please list name of each medication and dose if known) furosemide 80 mg and Spironolactone 25 mg  2. Which pharmacy/location (including street and city if local pharmacy) is medication to be sent to? [facility] [address], [place], [place]  3. Do they need a 30 day or 90 day supply?  Spironolactone 90 day supply and furosemide 30 day

## 2016-11-12 ENCOUNTER — Encounter (HOSPITAL_BASED_OUTPATIENT_CLINIC_OR_DEPARTMENT_OTHER): Payer: BLUE CROSS/BLUE SHIELD | Attending: Surgery

## 2016-11-12 DIAGNOSIS — I890 Lymphedema, not elsewhere classified: Secondary | ICD-10-CM | POA: Diagnosis not present

## 2016-11-12 DIAGNOSIS — L97212 Non-pressure chronic ulcer of right calf with fat layer exposed: Secondary | ICD-10-CM | POA: Diagnosis not present

## 2016-11-12 DIAGNOSIS — I87311 Chronic venous hypertension (idiopathic) with ulcer of right lower extremity: Secondary | ICD-10-CM | POA: Insufficient documentation

## 2016-11-12 DIAGNOSIS — I739 Peripheral vascular disease, unspecified: Secondary | ICD-10-CM | POA: Diagnosis not present

## 2016-11-12 DIAGNOSIS — Z86718 Personal history of other venous thrombosis and embolism: Secondary | ICD-10-CM | POA: Diagnosis not present

## 2016-11-12 DIAGNOSIS — L97219 Non-pressure chronic ulcer of right calf with unspecified severity: Secondary | ICD-10-CM | POA: Diagnosis not present

## 2016-11-12 DIAGNOSIS — E11622 Type 2 diabetes mellitus with other skin ulcer: Secondary | ICD-10-CM | POA: Insufficient documentation

## 2016-11-12 DIAGNOSIS — I10 Essential (primary) hypertension: Secondary | ICD-10-CM | POA: Insufficient documentation

## 2016-11-13 ENCOUNTER — Ambulatory Visit: Payer: BLUE CROSS/BLUE SHIELD | Admitting: Podiatry

## 2016-11-19 DIAGNOSIS — E11622 Type 2 diabetes mellitus with other skin ulcer: Secondary | ICD-10-CM | POA: Diagnosis not present

## 2016-11-19 DIAGNOSIS — I890 Lymphedema, not elsewhere classified: Secondary | ICD-10-CM | POA: Diagnosis not present

## 2016-11-19 DIAGNOSIS — L97212 Non-pressure chronic ulcer of right calf with fat layer exposed: Secondary | ICD-10-CM | POA: Diagnosis not present

## 2016-11-19 DIAGNOSIS — Z86718 Personal history of other venous thrombosis and embolism: Secondary | ICD-10-CM | POA: Diagnosis not present

## 2016-11-19 DIAGNOSIS — I10 Essential (primary) hypertension: Secondary | ICD-10-CM | POA: Diagnosis not present

## 2016-11-19 DIAGNOSIS — I739 Peripheral vascular disease, unspecified: Secondary | ICD-10-CM | POA: Diagnosis not present

## 2016-11-19 DIAGNOSIS — I87311 Chronic venous hypertension (idiopathic) with ulcer of right lower extremity: Secondary | ICD-10-CM | POA: Diagnosis not present

## 2016-11-26 DIAGNOSIS — I890 Lymphedema, not elsewhere classified: Secondary | ICD-10-CM | POA: Diagnosis not present

## 2016-11-26 DIAGNOSIS — L97821 Non-pressure chronic ulcer of other part of left lower leg limited to breakdown of skin: Secondary | ICD-10-CM | POA: Diagnosis not present

## 2016-11-26 DIAGNOSIS — I87311 Chronic venous hypertension (idiopathic) with ulcer of right lower extremity: Secondary | ICD-10-CM | POA: Diagnosis not present

## 2016-11-26 DIAGNOSIS — I739 Peripheral vascular disease, unspecified: Secondary | ICD-10-CM | POA: Diagnosis not present

## 2016-11-26 DIAGNOSIS — E11622 Type 2 diabetes mellitus with other skin ulcer: Secondary | ICD-10-CM | POA: Diagnosis not present

## 2016-11-26 DIAGNOSIS — I10 Essential (primary) hypertension: Secondary | ICD-10-CM | POA: Diagnosis not present

## 2016-11-26 DIAGNOSIS — L97212 Non-pressure chronic ulcer of right calf with fat layer exposed: Secondary | ICD-10-CM | POA: Diagnosis not present

## 2016-11-26 DIAGNOSIS — Z86718 Personal history of other venous thrombosis and embolism: Secondary | ICD-10-CM | POA: Diagnosis not present

## 2016-12-03 DIAGNOSIS — I739 Peripheral vascular disease, unspecified: Secondary | ICD-10-CM | POA: Diagnosis not present

## 2016-12-03 DIAGNOSIS — Z86718 Personal history of other venous thrombosis and embolism: Secondary | ICD-10-CM | POA: Diagnosis not present

## 2016-12-03 DIAGNOSIS — E11622 Type 2 diabetes mellitus with other skin ulcer: Secondary | ICD-10-CM | POA: Diagnosis not present

## 2016-12-03 DIAGNOSIS — L97212 Non-pressure chronic ulcer of right calf with fat layer exposed: Secondary | ICD-10-CM | POA: Diagnosis not present

## 2016-12-03 DIAGNOSIS — I10 Essential (primary) hypertension: Secondary | ICD-10-CM | POA: Diagnosis not present

## 2016-12-03 DIAGNOSIS — I890 Lymphedema, not elsewhere classified: Secondary | ICD-10-CM | POA: Diagnosis not present

## 2016-12-03 DIAGNOSIS — I87311 Chronic venous hypertension (idiopathic) with ulcer of right lower extremity: Secondary | ICD-10-CM | POA: Diagnosis not present

## 2016-12-09 ENCOUNTER — Other Ambulatory Visit: Payer: Self-pay

## 2016-12-09 ENCOUNTER — Telehealth: Payer: Self-pay | Admitting: Cardiovascular Disease

## 2016-12-09 MED ORDER — CARVEDILOL 12.5 MG PO TABS: 12.5000 mg | ORAL_TABLET | Freq: Two times a day (BID) | ORAL | 3 refills | Status: DC

## 2016-12-09 NOTE — Telephone Encounter (Signed)
New message       *STAT* If patient is at the pharmacy, call can be transferred to refill team.   1. Which medications need to be refilled? (please list name of each medication and dose if known)  Carvedilol 12.5mg   2. Which pharmacy/location (including street and city if local pharmacy) is medication to be sent to?  [facility] [address] 3. Do they need a 30 day or 90 day supply? 90 day

## 2016-12-10 ENCOUNTER — Encounter (HOSPITAL_BASED_OUTPATIENT_CLINIC_OR_DEPARTMENT_OTHER): Payer: BLUE CROSS/BLUE SHIELD | Attending: Surgery

## 2016-12-10 DIAGNOSIS — L97812 Non-pressure chronic ulcer of other part of right lower leg with fat layer exposed: Secondary | ICD-10-CM | POA: Diagnosis not present

## 2016-12-10 DIAGNOSIS — I890 Lymphedema, not elsewhere classified: Secondary | ICD-10-CM | POA: Insufficient documentation

## 2016-12-10 DIAGNOSIS — I10 Essential (primary) hypertension: Secondary | ICD-10-CM | POA: Diagnosis not present

## 2016-12-10 DIAGNOSIS — Z86718 Personal history of other venous thrombosis and embolism: Secondary | ICD-10-CM | POA: Diagnosis not present

## 2016-12-10 DIAGNOSIS — L97212 Non-pressure chronic ulcer of right calf with fat layer exposed: Secondary | ICD-10-CM | POA: Insufficient documentation

## 2016-12-10 DIAGNOSIS — E1151 Type 2 diabetes mellitus with diabetic peripheral angiopathy without gangrene: Secondary | ICD-10-CM | POA: Insufficient documentation

## 2016-12-10 DIAGNOSIS — E11622 Type 2 diabetes mellitus with other skin ulcer: Secondary | ICD-10-CM | POA: Diagnosis not present

## 2016-12-10 DIAGNOSIS — I87311 Chronic venous hypertension (idiopathic) with ulcer of right lower extremity: Secondary | ICD-10-CM | POA: Insufficient documentation

## 2016-12-17 DIAGNOSIS — L97212 Non-pressure chronic ulcer of right calf with fat layer exposed: Secondary | ICD-10-CM | POA: Diagnosis not present

## 2016-12-17 DIAGNOSIS — I87311 Chronic venous hypertension (idiopathic) with ulcer of right lower extremity: Secondary | ICD-10-CM | POA: Diagnosis not present

## 2016-12-17 DIAGNOSIS — I10 Essential (primary) hypertension: Secondary | ICD-10-CM | POA: Diagnosis not present

## 2016-12-17 DIAGNOSIS — Z86718 Personal history of other venous thrombosis and embolism: Secondary | ICD-10-CM | POA: Diagnosis not present

## 2016-12-17 DIAGNOSIS — E1151 Type 2 diabetes mellitus with diabetic peripheral angiopathy without gangrene: Secondary | ICD-10-CM | POA: Diagnosis not present

## 2016-12-17 DIAGNOSIS — E11622 Type 2 diabetes mellitus with other skin ulcer: Secondary | ICD-10-CM | POA: Diagnosis not present

## 2016-12-17 DIAGNOSIS — I890 Lymphedema, not elsewhere classified: Secondary | ICD-10-CM | POA: Diagnosis not present

## 2016-12-24 DIAGNOSIS — E11622 Type 2 diabetes mellitus with other skin ulcer: Secondary | ICD-10-CM | POA: Diagnosis not present

## 2016-12-24 DIAGNOSIS — I87311 Chronic venous hypertension (idiopathic) with ulcer of right lower extremity: Secondary | ICD-10-CM | POA: Diagnosis not present

## 2016-12-24 DIAGNOSIS — Z86718 Personal history of other venous thrombosis and embolism: Secondary | ICD-10-CM | POA: Diagnosis not present

## 2016-12-24 DIAGNOSIS — L97219 Non-pressure chronic ulcer of right calf with unspecified severity: Secondary | ICD-10-CM | POA: Diagnosis not present

## 2016-12-24 DIAGNOSIS — I10 Essential (primary) hypertension: Secondary | ICD-10-CM | POA: Diagnosis not present

## 2016-12-24 DIAGNOSIS — I890 Lymphedema, not elsewhere classified: Secondary | ICD-10-CM | POA: Diagnosis not present

## 2016-12-24 DIAGNOSIS — L97212 Non-pressure chronic ulcer of right calf with fat layer exposed: Secondary | ICD-10-CM | POA: Diagnosis not present

## 2016-12-24 DIAGNOSIS — E1151 Type 2 diabetes mellitus with diabetic peripheral angiopathy without gangrene: Secondary | ICD-10-CM | POA: Diagnosis not present

## 2017-01-14 DIAGNOSIS — H7101 Cholesteatoma of attic, right ear: Secondary | ICD-10-CM | POA: Diagnosis not present

## 2017-01-14 DIAGNOSIS — N529 Male erectile dysfunction, unspecified: Secondary | ICD-10-CM | POA: Diagnosis not present

## 2017-01-14 DIAGNOSIS — T162XXA Foreign body in left ear, initial encounter: Secondary | ICD-10-CM | POA: Diagnosis not present

## 2017-01-20 ENCOUNTER — Encounter (HOSPITAL_COMMUNITY): Payer: Self-pay | Admitting: Emergency Medicine

## 2017-01-20 ENCOUNTER — Emergency Department (HOSPITAL_COMMUNITY)
Admission: EM | Admit: 2017-01-20 | Discharge: 2017-01-21 | Disposition: A | Discharge status: Home / Self Care | Payer: BLUE CROSS/BLUE SHIELD | Attending: Emergency Medicine | Admitting: Emergency Medicine

## 2017-01-20 ENCOUNTER — Emergency Department (HOSPITAL_COMMUNITY): Payer: BLUE CROSS/BLUE SHIELD

## 2017-01-20 DIAGNOSIS — Y9389 Activity, other specified: Secondary | ICD-10-CM | POA: Diagnosis not present

## 2017-01-20 DIAGNOSIS — S161XXA Strain of muscle, fascia and tendon at neck level, initial encounter: Secondary | ICD-10-CM

## 2017-01-20 DIAGNOSIS — S199XXA Unspecified injury of neck, initial encounter: Secondary | ICD-10-CM | POA: Diagnosis not present

## 2017-01-20 DIAGNOSIS — I509 Heart failure, unspecified: Secondary | ICD-10-CM | POA: Insufficient documentation

## 2017-01-20 DIAGNOSIS — S8992XA Unspecified injury of left lower leg, initial encounter: Secondary | ICD-10-CM | POA: Diagnosis not present

## 2017-01-20 DIAGNOSIS — I110 Hypertensive heart disease with heart failure: Secondary | ICD-10-CM | POA: Diagnosis not present

## 2017-01-20 DIAGNOSIS — Y92410 Unspecified street and highway as the place of occurrence of the external cause: Secondary | ICD-10-CM | POA: Insufficient documentation

## 2017-01-20 DIAGNOSIS — M25562 Pain in left knee: Secondary | ICD-10-CM | POA: Diagnosis not present

## 2017-01-20 DIAGNOSIS — S8002XA Contusion of left knee, initial encounter: Secondary | ICD-10-CM | POA: Diagnosis not present

## 2017-01-20 DIAGNOSIS — E1165 Type 2 diabetes mellitus with hyperglycemia: Secondary | ICD-10-CM | POA: Insufficient documentation

## 2017-01-20 DIAGNOSIS — Y999 Unspecified external cause status: Secondary | ICD-10-CM | POA: Insufficient documentation

## 2017-01-20 DIAGNOSIS — S3992XA Unspecified injury of lower back, initial encounter: Secondary | ICD-10-CM | POA: Diagnosis not present

## 2017-01-20 DIAGNOSIS — M545 Low back pain: Secondary | ICD-10-CM | POA: Diagnosis not present

## 2017-01-20 DIAGNOSIS — Z79899 Other long term (current) drug therapy: Secondary | ICD-10-CM | POA: Insufficient documentation

## 2017-01-20 DIAGNOSIS — S39012A Strain of muscle, fascia and tendon of lower back, initial encounter: Secondary | ICD-10-CM | POA: Diagnosis not present

## 2017-01-20 DIAGNOSIS — M542 Cervicalgia: Secondary | ICD-10-CM | POA: Diagnosis not present
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
## 2017-01-21 MED ORDER — CYCLOBENZAPRINE HCL 10 MG PO TABS: 10.0000 mg | ORAL_TABLET | Freq: Three times a day (TID) | ORAL | 0 refills | Status: DC | PRN

## 2017-01-21 NOTE — Discharge Instructions (Signed)
Ibuprofen 600 mg every 6 hours as needed for pain.  Flexeril as prescribed as needed for pain not relieved with ibuprofen.  Follow-up with your primary doctor if symptoms are not improving in the next week.

## 2017-03-13 ENCOUNTER — Ambulatory Visit: Payer: BLUE CROSS/BLUE SHIELD | Admitting: Cardiovascular Disease

## 2017-03-13 ENCOUNTER — Encounter: Payer: Self-pay | Admitting: Cardiovascular Disease

## 2017-03-13 VITALS — BP 108/62 | HR 80 | Ht 74.0 in | Wt >= 6400 oz

## 2017-03-13 DIAGNOSIS — I509 Heart failure, unspecified: Secondary | ICD-10-CM | POA: Diagnosis not present

## 2017-03-13 DIAGNOSIS — Z5181 Encounter for therapeutic drug level monitoring: Secondary | ICD-10-CM | POA: Diagnosis not present

## 2017-03-13 DIAGNOSIS — I10 Essential (primary) hypertension: Secondary | ICD-10-CM

## 2017-03-13 NOTE — Patient Instructions (Signed)
Medication Instructions:  Your physician recommends that you continue on your current medications as directed. Please refer to the Current Medication list given to you today.  Labwork: BMET/MAGNESIUM TODAY   Testing/Procedures: NONE  Follow-Up: Your physician wants you to follow-up in: 6 MONTH OV You will receive a reminder letter in the mail two months in advance. If you don't receive a letter, please call our office to schedule the follow-up appointment.  If you need a refill on your cardiac medications before your next appointment, please call your pharmacy.  

## 2017-03-13 NOTE — Progress Notes (Signed)
Cardiology Office Note   Date:  03/13/2017   ID:  [name], DOB 09-28-1980, MRN [id number]  PCP:  [name].Shawn Saucer, MD  Cardiologist:   Shawn Si, MD   Chief Complaint  Patient presents with  . Follow-up     History of Present Illness: [name] is a 37 y.o. male with chronic heart failure type unknown, hypertension, hyperlipidemia, diabetes, morbid obesity, OSA, and venous insufficiency who presents for follow-up. [name] was admitted 1/10 through 1/26 2018 for acute heart failure exacerbation. Echocardiogram was not interpretable due to body habitus. He was diuresed with IV Lasix with a negative output of 47.2 L. His weight decreased from 631 pounds on admission to 488 pounds at discharge. His creatinine remained stable.  He followed up with [name] on 04/12/16 and was doing well. His weight was 498 pounds. His blood pressure was poorly-controlled. It was noted that carvedilol had been left off his medication list at discharge. This was restarted at that appointment. He followed up with [name] and was doing well. He was exercising and working out with a Systems analyst 3 times per week. on 07/2016.  His blood pressure was slightly elevated at that appointment, but he had come straight from the gym, so no changes were made.   Since his last appointment [name] has been feeling well.  He has no chest pain or shortness of breath.  He got his weight down to 465 pounds.  However, with the holidays and missing a dose of Lasix his weight has started to go back up.  He has not noted any orthopnea or PND.  His lower extremity edema with a little worse last week and he developed a blister on his leg but that is improved with wrapping.  He denies fever or chills.  He continues to go to the gym 3 or 4 times per week.  He does cardio for 1 hour either by walking on the treadmill or riding [name] exercise bike.  He has no lightheadedness or dizziness.  He has noted some  cramping in his legs that occurred at the end of December.   Past Medical History:  Diagnosis Date  . Chronic venous insufficiency    a. as a premature infant, required venous access for care with subsequent DVT and some sort of procedure -> eventually went on to have vein bypass around 2005.  [name] Hypertension   . Lymphedema   . Morbid obesity (HCC)   . OSA on CPAP     Past Surgical History:  Procedure Laterality Date  . balloon stenting Right    right leg  . VEIN LIGATION AND STRIPPING       Current Outpatient Medications  Medication Sig Dispense Refill  . carvedilol (COREG) 12.5 MG tablet Take 1 tablet (12.5 mg total) by mouth 2 (two) times daily. 180 tablet 3  . cyclobenzaprine (FLEXERIL) 10 MG tablet Take 1 tablet (10 mg total) 3 (three) times daily as needed by mouth for muscle spasms. 15 tablet 0  . furosemide (LASIX) 80 MG tablet Take 1 tablet (80 mg total) by mouth 2 (two) times daily. 60 tablet 12  . HYDROcodone-acetaminophen (NORCO/VICODIN) 5-325 MG tablet Take 1 tablet by mouth every 6 (six) hours as needed for moderate pain. 30 tablet 0  . ibuprofen (ADVIL,MOTRIN) 200 MG tablet Take 800 mg by mouth every 6 (six) hours as needed for moderate pain.    [name] lisinopril (PRINIVIL,ZESTRIL) 40 MG tablet Take 1  tablet (40 mg total) by mouth daily. 90 tablet 3  . potassium chloride SA (K-DUR,KLOR-CON) 20 MEQ tablet Take 2 tablets (40 mEq total) by mouth 2 (two) times daily. 360 tablet 3  . spironolactone (ALDACTONE) 25 MG tablet Take 1 tablet (25 mg total) by mouth daily. 90 tablet 3   No current facility-administered medications for this visit.     Allergies:   Lidocaine and Peanut-containing drug products    Social History:  The patient  reports that  has never smoked. he has never used smokeless tobacco. He reports that he drinks about 2.0 oz of alcohol per week. He reports that he does not use drugs.   Family History:  The patient's family history includes Cancer in his  father, maternal grandfather, and maternal grandmother; Diabetes in his mother.    ROS:  Please see the history of present illness.   Otherwise, review of systems are positive for none.   All other systems are reviewed and negative.    PHYSICAL EXAM: VS:  BP 108/62   Pulse 80   Ht 6\' 2"  (1.88 m)   Wt (!) 480 lb (217.7 kg)   BMI 61.63 kg/m  , BMI Body mass index is 61.63 kg/m. GENERAL:  Well appearing.  No acute distress.  Morbidly obese.  HEENT: Pupils equal round and reactive, fundi not visualized, oral mucosa unremarkable NECK:  No jugular venous distention, waveform within normal limits, carotid upstroke brisk and symmetric, no bruits, no thyromegaly LYMPHATICS:  No cervical adenopathy LUNGS:  Clear to auscultation bilaterally.  No crackles, wheezes or rhonchi.  HEART:  RRR.  PMI not displaced or sustained,S1 and S2 within normal limits, no S3, no S4, no clicks, no rubs, no murmurs ABD:  Non-distended.  Positive bowel sounds normal in frequency in pitch, no bruits, no rebound, no guarding, no midline pulsatile mass, no hepatomegaly, no splenomegaly EXT:  2 plus pulses throughout, Tense, R LE edema wrapped.  1+ LLE edema.  No cyanosis no clubbing SKIN:  No rashes no nodules NEURO:  Cranial nerves II through XII grossly intact, motor grossly intact throughout PSYCH:  Cognitively intact, oriented to person place and time   EKG:  EKG is ordered today. 11/05/16: Sinus rhythm.  Rate 72 bpm.  Low voltage.  03/13/16: Sinus rhythm.  Rate 80 bpm.  Nonspecific T wave abnormalities.  Recent Labs: 03/20/2016: ALT 22; B Natriuretic Peptide 171.0 03/22/2016: Hemoglobin 10.7; Platelets 239 03/23/2016: TSH 5.569 03/24/2016: Magnesium 1.9 11/05/2016: BUN 10; Creatinine, Ser 0.84; Potassium 3.9; Sodium 141    Lipid Panel No results found for: CHOL, TRIG, HDL, CHOLHDL, VLDL, LDLCALC, LDLDIRECT    Wt Readings from Last 3 Encounters:  03/13/17 (!) 480 lb (217.7 kg)  11/05/16 (!) 482 lb 9.6 oz  (218.9 kg)  08/02/16 (!) 481 lb (218.2 kg)      ASSESSMENT AND PLAN:  # Chronic heart failure, type unknown: Unable to determine his classification of heart failure due to body habitus. His lower extremity edema has improved significantly.  He is doing well.  He has some mild LE edema after missing lasix doses while traveling and due to dietary indiscretion.  Symptoms improving since he restarted lasix.  Continue carvedilol, lasix, spironolactone, and lisinopril.  Check BMP and magnesium.   # Hypertension: BP well-controlled.  Continue carvedilol, lisinopril, spironolactone, and furosemide.   Current medicines are reviewed at length with the patient today.  The patient does not have concerns regarding medicines.  The following changes have been made:  no change  Labs/ tests ordered today include:   Orders Placed This Encounter  Procedures  . Basic metabolic panel  . Magnesium  . EKG 12-Lead     Disposition:   FU with Beatric Fulop C. Duke Salviaandolph, MD, [facility] in 6 months.  This note was written with the assistance of speech recognition software.  Please excuse any transcriptional errors.  Signed, Zameer Borman C. Duke Salviaandolph, MD, [facility]  03/13/2017 3:47 PM    [facility]

## 2017-03-14 LAB — BASIC METABOLIC PANEL
BUN / CREAT RATIO: 14 (ref 9–20)
BUN: 17 mg/dL (ref 6–20)
CHLORIDE: 94 mmol/L — AB (ref 96–106)
CO2: 24 mmol/L (ref 20–29)
Calcium: 9.5 mg/dL (ref 8.7–10.2)
Creatinine, Ser: 1.24 mg/dL (ref 0.76–1.27)
GFR calc non Af Amer: 74 mL/min/{1.73_m2} (ref >59)
GFR, EST AFRICAN AMERICAN: 86 mL/min/{1.73_m2} (ref >59)
Glucose: 106 mg/dL — ABNORMAL HIGH (ref 65–99)
POTASSIUM: 3.8 mmol/L (ref 3.5–5.2)
Sodium: 138 mmol/L (ref 134–144)

## 2017-03-14 LAB — MAGNESIUM: Magnesium: 2.1 mg/dL (ref 1.6–2.3)

## 2017-04-14 DIAGNOSIS — I10 Essential (primary) hypertension: Secondary | ICD-10-CM | POA: Diagnosis not present

## 2017-04-14 DIAGNOSIS — R7303 Prediabetes: Secondary | ICD-10-CM | POA: Diagnosis not present

## 2017-09-13 IMAGING — DX DG CHEST 1V
1 series · 1 of 1 positions shown · non-contrast
Comparison: Chest radiographs 08/28/2007

CLINICAL DATA: Shortness of breath

EXAM:
CHEST 1 VIEW

[chest ap]
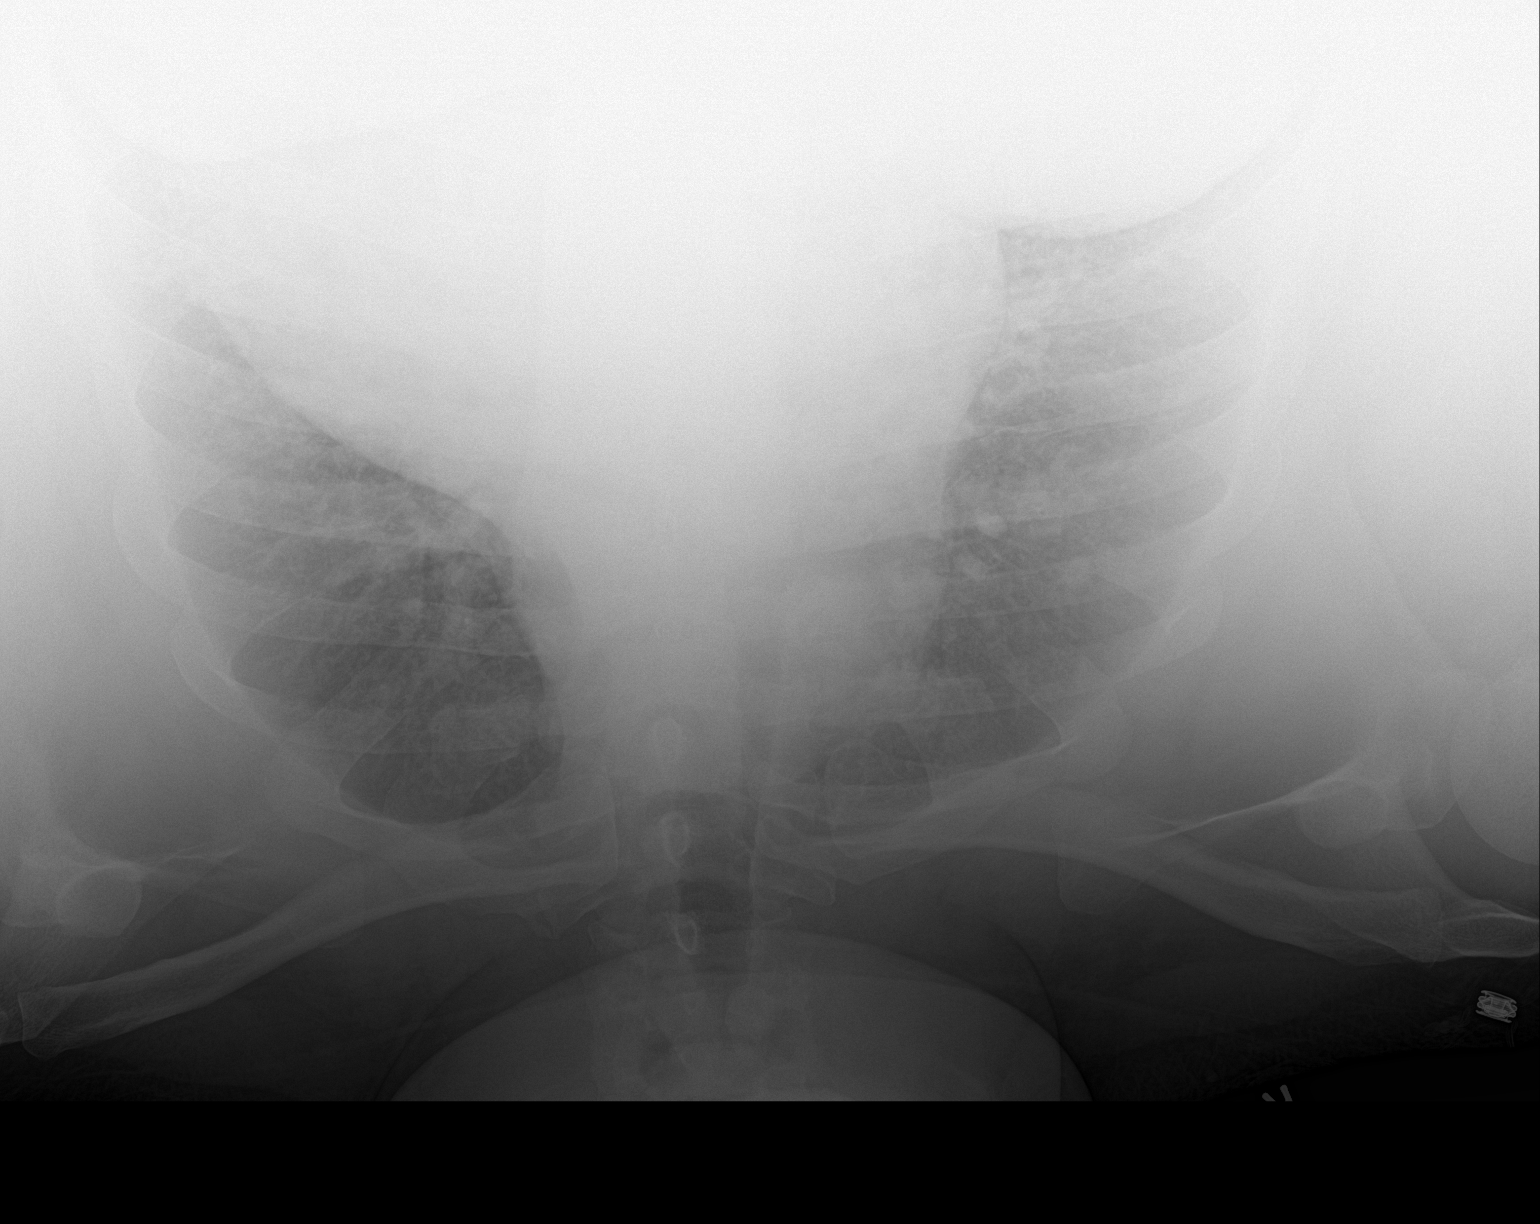

[1 of 1 positions shown; findings below may reference images not displayed]

FINDINGS: Examination is degraded by underpenetration. The cardiac silhouette
is enlarged. No focal airspace consolidation is visualized. There is
shallow lung inflation without overt pulmonary edema.
IMPRESSION: Cardiomegaly and shallow lung inflation.  No visible consolidation.

## 2017-10-28 DIAGNOSIS — Z209 Contact with and (suspected) exposure to unspecified communicable disease: Secondary | ICD-10-CM | POA: Diagnosis not present

## 2017-10-28 DIAGNOSIS — R7303 Prediabetes: Secondary | ICD-10-CM | POA: Diagnosis not present

## 2017-10-28 DIAGNOSIS — I10 Essential (primary) hypertension: Secondary | ICD-10-CM | POA: Diagnosis not present

## 2017-10-30 ENCOUNTER — Other Ambulatory Visit: Payer: Self-pay | Admitting: Cardiovascular Disease

## 2017-10-31 ENCOUNTER — Ambulatory Visit (INDEPENDENT_AMBULATORY_CARE_PROVIDER_SITE_OTHER): Payer: BLUE CROSS/BLUE SHIELD | Admitting: Cardiovascular Disease

## 2017-10-31 ENCOUNTER — Encounter: Payer: Self-pay | Admitting: Cardiovascular Disease

## 2017-10-31 VITALS — BP 112/72 | HR 85 | Ht 74.0 in | Wt >= 6400 oz

## 2017-10-31 DIAGNOSIS — Z5181 Encounter for therapeutic drug level monitoring: Secondary | ICD-10-CM

## 2017-10-31 DIAGNOSIS — I10 Essential (primary) hypertension: Secondary | ICD-10-CM

## 2017-10-31 DIAGNOSIS — I509 Heart failure, unspecified: Secondary | ICD-10-CM | POA: Diagnosis not present

## 2017-10-31 MED ORDER — SPIRONOLACTONE 25 MG PO TABS: 25.0000 mg | ORAL_TABLET | Freq: Every day | ORAL | 3 refills | Status: DC

## 2017-10-31 NOTE — Patient Instructions (Signed)
Medication Instructions:  Your physician recommends that you continue on your current medications as directed. Please refer to the Current Medication list given to you today.  Labwork: none  Testing/Procedures: none  Follow-Up: Your physician wants you to follow-up in: 6 months with PA/NP You will receive a reminder letter in the mail two months in advance. If you don't receive a letter, please call our office to schedule the follow-up appointment.  Your physician wants you to follow-up in: 1 [facility]AR [facility]  You will receive a reminder letter in the mail two months in advance. If you don't receive a letter, please call our office to schedule the follow-up appointment.  If you need a refill on your cardiac medications before your next appointment, please call your pharmacy.

## 2017-10-31 NOTE — Progress Notes (Signed)
Cardiology Office Note   Date:  10/31/2017   ID:  [name], DOB 1980-06-27, MRN [id number]  PCP:  Asencion [name].[name], MD  Cardiologist:   Chilton Si, MD   No chief complaint on file.    History of Present Illness: [name] is a 37 y.o. male with chronic heart failure type unknown, hypertension, hyperlipidemia, diabetes, morbid obesity, OSA, and venous insufficiency who presents for follow-up. [name] was admitted 1/10 through 1/26 2018 for acute heart failure exacerbation. Echocardiogram was not interpretable due to body habitus. He was diuresed with IV Lasix with a negative output of 47.2 L. His weight decreased from 631 pounds on admission to 488 pounds at discharge. His creatinine remained stable.  He followed up with [name] on 04/12/16 and was doing well. His weight was 498 pounds. His blood pressure was poorly-controlled. It was noted that carvedilol had been left off his medication list at discharge. This was restarted at that appointment. He followed up with [name] and was doing well. He was exercising and working out with a Systems analyst 3 times per week. on 07/2016.  His blood pressure was slightly elevated at that appointment, but he had come straight from the gym, so no changes were made.   [name] continues to do very well.  He goes to the gym 4-5 days per week.  He does cardio, lifts weights and swims.  He has no exertional chest pain or shortness of breath.  He has not experienced any lower extremity edema, orthopnea, or PND.  He is continued to lose weight.  He had lost nearly 50 pounds but he went on a trip to [place] and [name] and gain some of it back.  Overall he is feeling well and without complaint.  He had a right lower extremity wound that was slow to heal but is finally doing better.   Past Medical History:  Diagnosis Date  . Chronic venous insufficiency    a. as a premature infant, required venous access for care with subsequent DVT and some  sort of procedure -> eventually went on to have vein bypass around 2005.  [name] Hypertension   . Lymphedema   . Morbid obesity (HCC)   . OSA on CPAP     Past Surgical History:  Procedure Laterality Date  . balloon stenting Right    right leg  . VEIN LIGATION AND STRIPPING       Current Outpatient Medications  Medication Sig Dispense Refill  . carvedilol (COREG) 12.5 MG tablet Take 1 tablet (12.5 mg total) by mouth 2 (two) times daily. 180 tablet 3  . cyclobenzaprine (FLEXERIL) 10 MG tablet Take 1 tablet (10 mg total) 3 (three) times daily as needed by mouth for muscle spasms. 15 tablet 0  . furosemide (LASIX) 80 MG tablet Take 1 tablet (80 mg total) by mouth 2 (two) times daily. 60 tablet 12  . HYDROcodone-acetaminophen (NORCO/VICODIN) 5-325 MG tablet Take 1 tablet by mouth every 6 (six) hours as needed for moderate pain. 30 tablet 0  . ibuprofen (ADVIL,MOTRIN) 200 MG tablet Take 800 mg by mouth every 6 (six) hours as needed for moderate pain.    [name] lisinopril (PRINIVIL,ZESTRIL) 40 MG tablet Take 1 tablet (40 mg total) by mouth daily. 90 tablet 3  . potassium chloride SA (K-DUR,KLOR-CON) 20 MEQ tablet Take 2 tablets (40 mEq total) by mouth 2 (two) times daily. 360 tablet 3  . spironolactone (ALDACTONE) 25 MG tablet Take 1 tablet (25  mg total) by mouth daily. 90 tablet 3   No current facility-administered medications for this visit.     Allergies:   Lidocaine and Peanut-containing drug products    Social History:  The patient  reports that he has never smoked. He has never used smokeless tobacco. He reports that he drinks about 4.0 standard drinks of alcohol per week. He reports that he does not use drugs.   Family History:  The patient's family history includes Cancer in his father, maternal grandfather, and maternal grandmother; Diabetes in his mother.    ROS:  Please see the history of present illness.   Otherwise, review of systems are positive for none.   All other systems are  reviewed and negative.    PHYSICAL EXAM: VS:  BP 112/72   Pulse 85   Ht 6\' 2"  (1.88 m)   Wt (!) 468 lb 3.2 oz (212.4 kg)   BMI 60.11 kg/m  , BMI Body mass index is 60.11 kg/m. GENERAL:  Well appearing.  HEENT: Pupils equal round and reactive, fundi not visualized, oral mucosa unremarkable NECK:  No jugular venous distention, waveform within normal limits, carotid upstroke brisk and symmetric, no bruits LUNGS:  Clear to auscultation bilaterally HEART:  RRR.  PMI not displaced or sustained,S1 and S2 within normal limits, no S3, no S4, no clicks, no rubs, no murmurs ABD:  Flat, positive bowel sounds normal in frequency in pitch, no bruits, no rebound, no guarding, no midline pulsatile mass, no hepatomegaly, no splenomegaly EXT:  2 plus pulses throughout, no edema, no cyanosis no clubbing.  R LE wrapped.  SKIN:  No rashes no nodules NEURO:  Cranial nerves II through XII grossly intact, motor grossly intact throughout PSYCH:  Cognitively intact, oriented to person place and time   EKG:  EKG is ordered today. 11/05/16: Sinus rhythm.  Rate 72 bpm.  Low voltage.  03/13/16: Sinus rhythm.  Rate 80 bpm.  Nonspecific T wave abnormalities. 10/31/2017: Sinus rhythm.  Rate 85 bpm.  Prior anteroseptal infarct.  Low voltage.  Recent Labs: 03/13/2017: BUN 17; Creatinine, Ser 1.24; Magnesium 2.1; Potassium 3.8; Sodium 138    Lipid Panel No results found for: CHOL, TRIG, HDL, CHOLHDL, VLDL, LDLCALC, LDLDIRECT    Wt Readings from Last 3 Encounters:  10/31/17 (!) 468 lb 3.2 oz (212.4 kg)  03/13/17 (!) 480 lb (217.7 kg)  11/05/16 (!) 482 lb 9.6 oz (218.9 kg)      ASSESSMENT AND PLAN:  # Chronic heart failure, type unknown: [name] continues to do fantastic.  He has not had any heart failure symptoms.  He is exercising and has no exertional symptoms.  Continue carvedilol, lisinopril, spironolactone, and Lasix.  He had labs checked with his PCP.  We will get a copy.  # Hypertension: BP  well-controlled.  Continue carvedilol, lisinopril, spironolactone, and furosemide.  # Morbid obesity: [name] was congratulated on his exercise efforts and weight loss.    Current medicines are reviewed at length with the patient today.  The patient does not have concerns regarding medicines.  The following changes have been made:  no change  Labs/ tests ordered today include:   Orders Placed This Encounter  Procedures  . EKG 12-Lead     Disposition:   FU with Alleyne Lac C. Duke Salviaandolph, MD, [facility] in 1 year.  APP in 6 months.    Signed, Amyiah Gaba C. Duke Salviaandolph, MD, [facility]  10/31/2017 4:29 PM    [facility]

## 2017-11-13 DIAGNOSIS — N529 Male erectile dysfunction, unspecified: Secondary | ICD-10-CM | POA: Diagnosis not present

## 2017-11-13 DIAGNOSIS — M79604 Pain in right leg: Secondary | ICD-10-CM | POA: Diagnosis not present

## 2017-12-07 IMAGING — CR DG CHEST 2V
3 series · 3 of 3 positions shown · non-contrast
Comparison: 12/26/2015

CLINICAL DATA: Shortness of breath and abdominal swelling for 3
days. Chronic lymphedema. High blood pressure.

EXAM:
CHEST  2 VIEW

[w chest pa (1 of 2)]
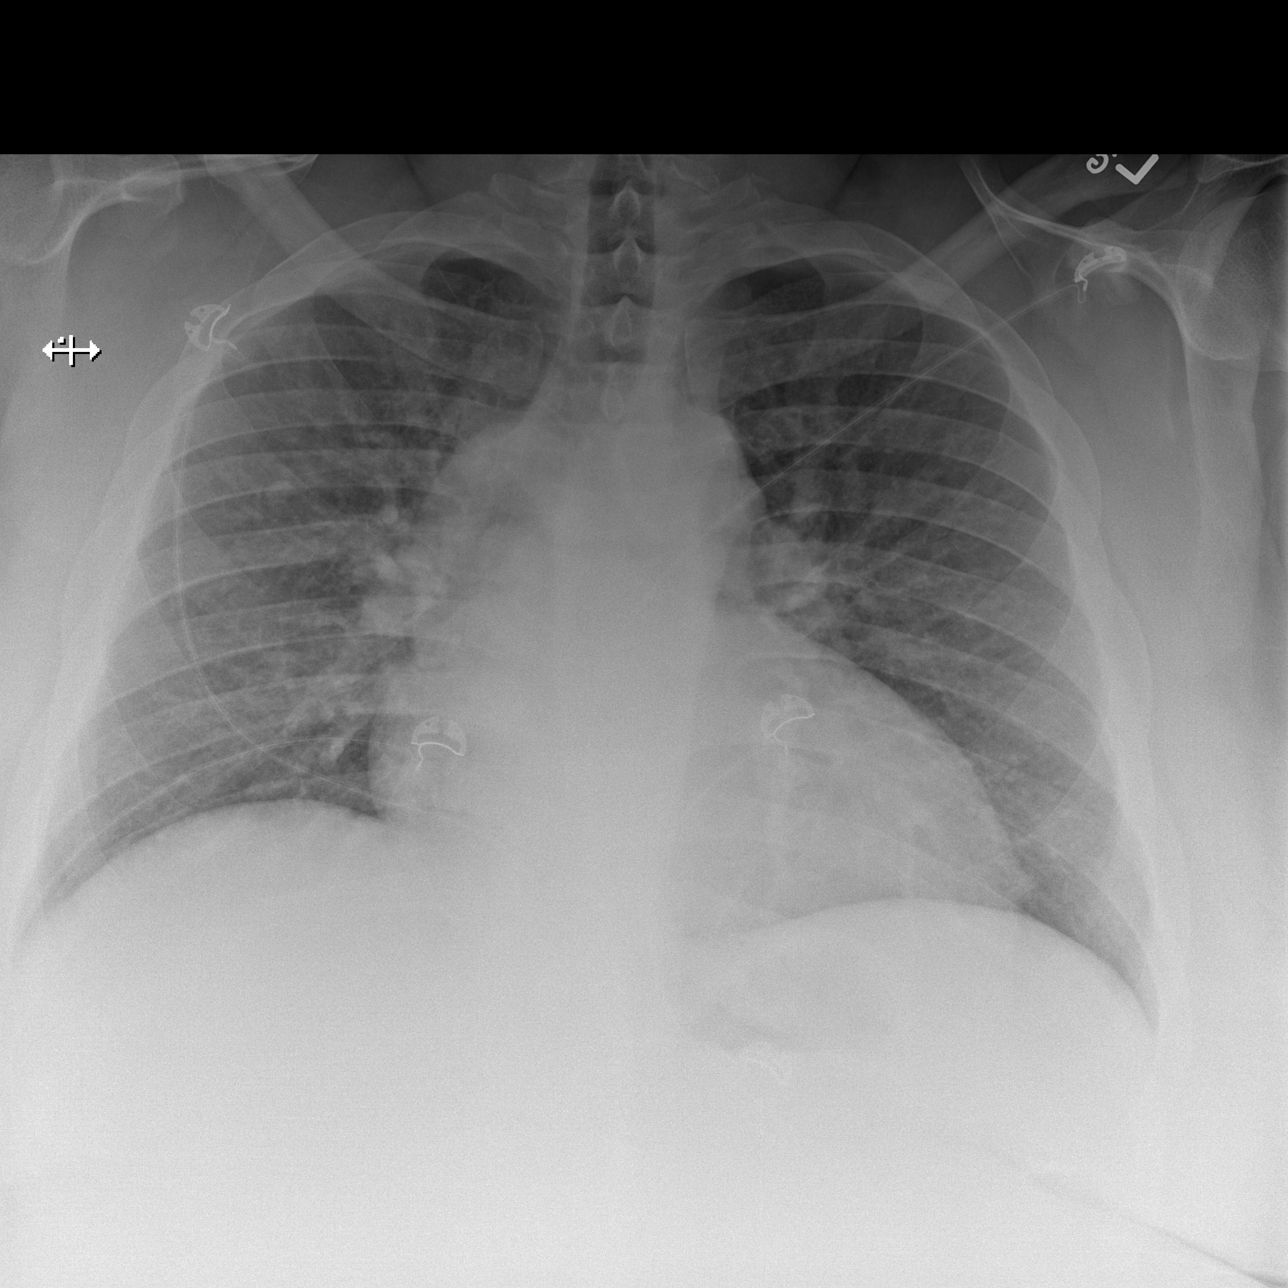

[w chest pa (2 of 2)]
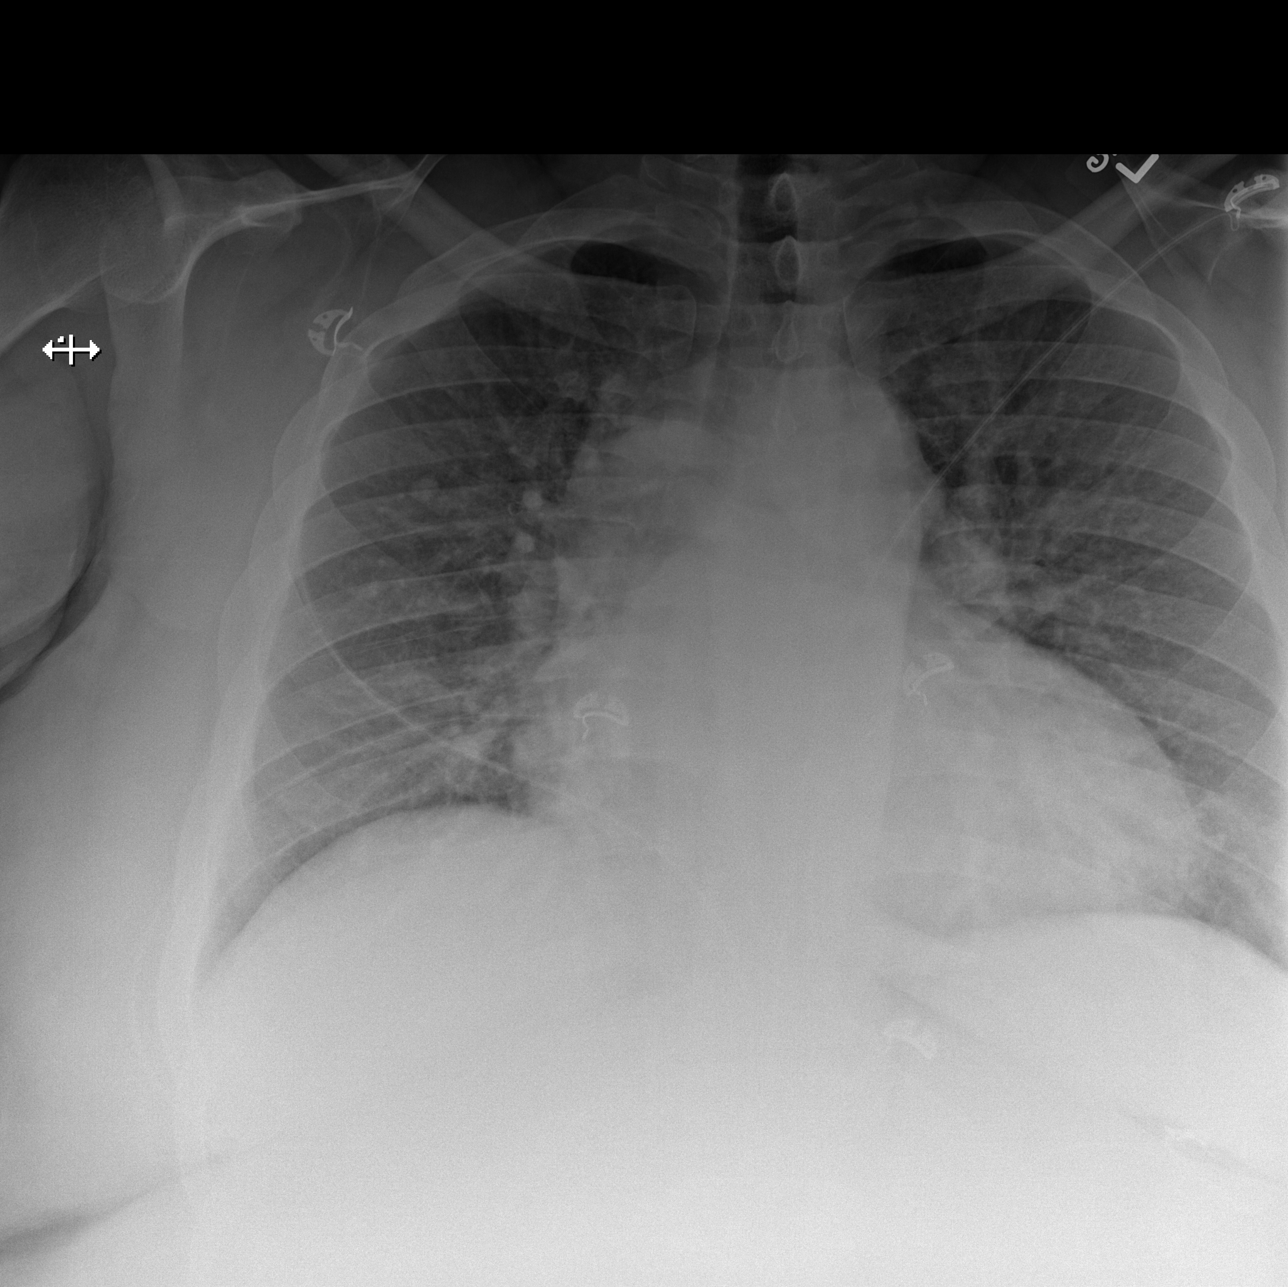

[w chest lat]
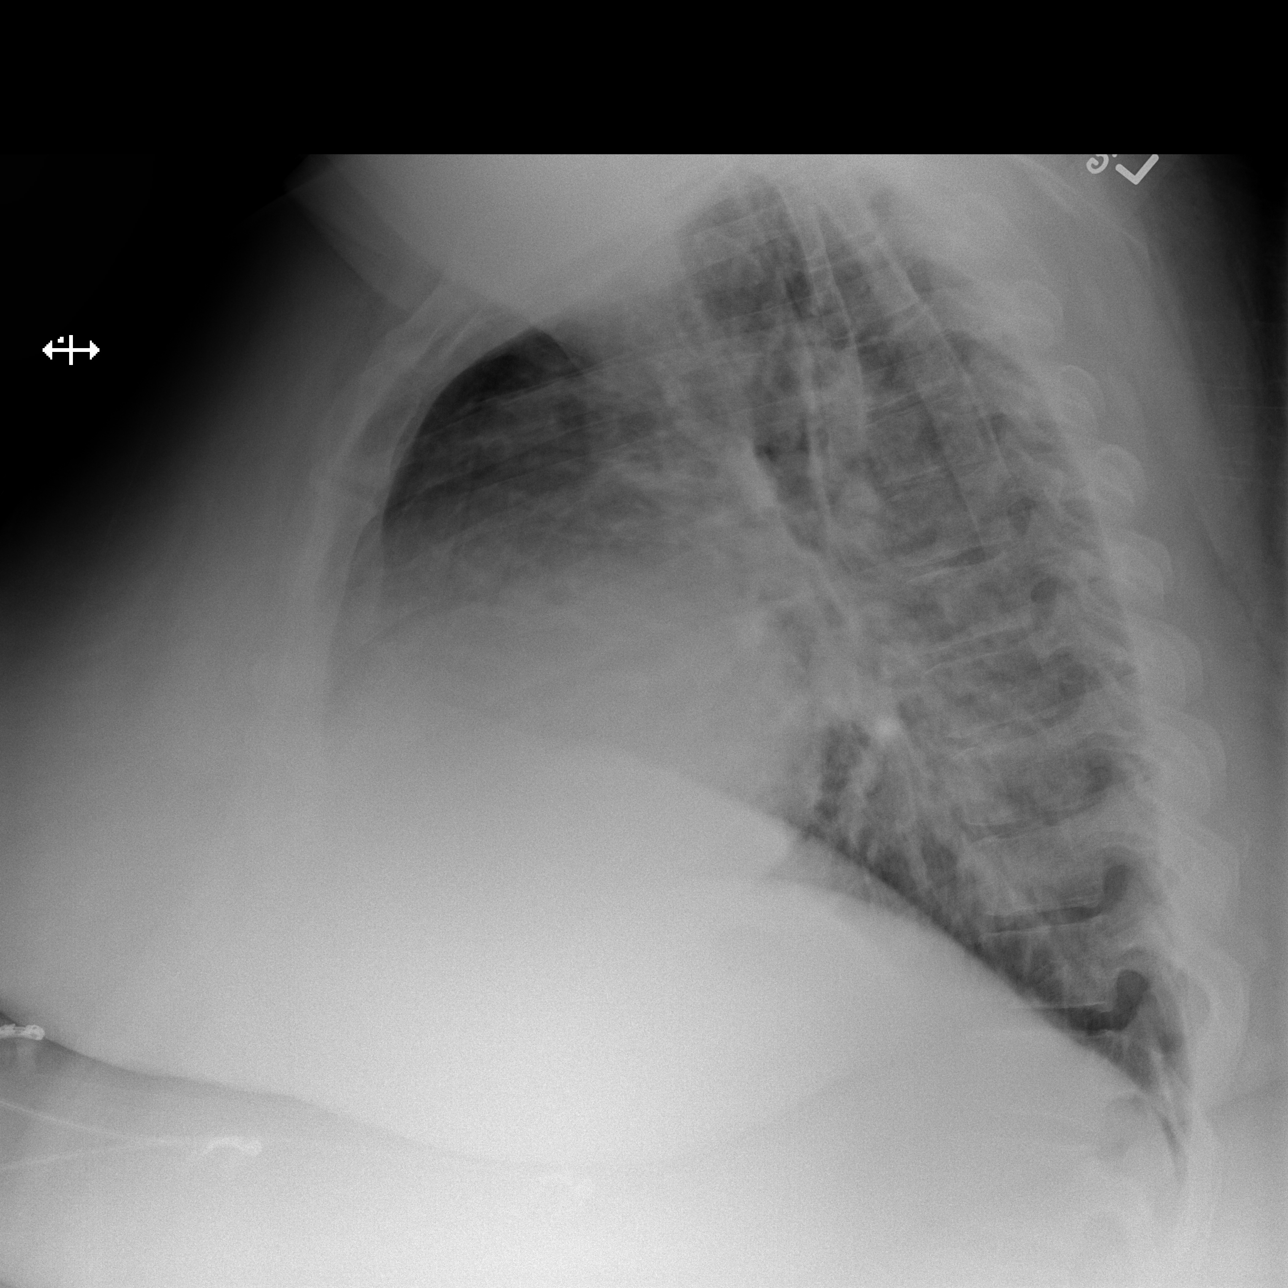

[3 of 3 positions shown; findings below may reference images not displayed]

FINDINGS: Shallow inspiration. Borderline heart size without vascular
congestion or edema. No focal consolidation or airspace disease. No
blunting of costophrenic angles. No pneumothorax. Calcified
granuloma in the right lung.
IMPRESSION: Cardiac enlargement.  No evidence of active pulmonary disease.

## 2017-12-08 ENCOUNTER — Other Ambulatory Visit: Payer: Self-pay | Admitting: Cardiovascular Disease

## 2017-12-09 DIAGNOSIS — R300 Dysuria: Secondary | ICD-10-CM | POA: Diagnosis not present

## 2017-12-09 DIAGNOSIS — N341 Nonspecific urethritis: Secondary | ICD-10-CM | POA: Diagnosis not present

## 2017-12-12 ENCOUNTER — Other Ambulatory Visit: Payer: Self-pay | Admitting: Cardiovascular Disease

## 2017-12-12 NOTE — Telephone Encounter (Signed)
Rx request sent to pharmacy.  

## 2017-12-29 DIAGNOSIS — R300 Dysuria: Secondary | ICD-10-CM | POA: Diagnosis not present

## 2017-12-29 DIAGNOSIS — N23 Unspecified renal colic: Secondary | ICD-10-CM | POA: Diagnosis not present

## 2018-01-01 ENCOUNTER — Other Ambulatory Visit: Payer: Self-pay | Admitting: Urology

## 2018-01-01 DIAGNOSIS — R350 Frequency of micturition: Secondary | ICD-10-CM | POA: Diagnosis not present

## 2018-01-01 DIAGNOSIS — R1084 Generalized abdominal pain: Secondary | ICD-10-CM | POA: Diagnosis not present

## 2018-01-01 DIAGNOSIS — R300 Dysuria: Secondary | ICD-10-CM | POA: Diagnosis not present

## 2018-01-01 DIAGNOSIS — R109 Unspecified abdominal pain: Secondary | ICD-10-CM

## 2018-01-07 ENCOUNTER — Ambulatory Visit (HOSPITAL_COMMUNITY)
Admission: RE | Admit: 2018-01-07 | Discharge: 2018-01-07 | Disposition: A | Discharge status: Home / Self Care | Payer: BLUE CROSS/BLUE SHIELD | Source: Ambulatory Visit | Attending: Urology | Admitting: Urology

## 2018-01-07 DIAGNOSIS — N200 Calculus of kidney: Secondary | ICD-10-CM | POA: Diagnosis not present

## 2018-01-07 DIAGNOSIS — R109 Unspecified abdominal pain: Secondary | ICD-10-CM | POA: Insufficient documentation

## 2018-01-07 DIAGNOSIS — R300 Dysuria: Secondary | ICD-10-CM | POA: Diagnosis not present

## 2018-01-22 DIAGNOSIS — R1084 Generalized abdominal pain: Secondary | ICD-10-CM | POA: Diagnosis not present

## 2018-01-22 DIAGNOSIS — N200 Calculus of kidney: Secondary | ICD-10-CM | POA: Diagnosis not present

## 2018-04-30 DIAGNOSIS — R8290 Unspecified abnormal findings in urine: Secondary | ICD-10-CM | POA: Diagnosis not present

## 2018-04-30 DIAGNOSIS — I10 Essential (primary) hypertension: Secondary | ICD-10-CM | POA: Diagnosis not present

## 2018-04-30 DIAGNOSIS — R7303 Prediabetes: Secondary | ICD-10-CM | POA: Diagnosis not present

## 2018-05-04 ENCOUNTER — Encounter: Payer: Self-pay | Admitting: Cardiovascular Disease

## 2018-05-04 ENCOUNTER — Ambulatory Visit: Payer: BLUE CROSS/BLUE SHIELD | Admitting: Cardiovascular Disease

## 2018-05-04 VITALS — BP 142/86 | HR 69 | Ht 74.0 in | Wt >= 6400 oz

## 2018-05-04 DIAGNOSIS — I509 Heart failure, unspecified: Secondary | ICD-10-CM

## 2018-05-04 DIAGNOSIS — L97811 Non-pressure chronic ulcer of other part of right lower leg limited to breakdown of skin: Secondary | ICD-10-CM

## 2018-05-04 DIAGNOSIS — I10 Essential (primary) hypertension: Secondary | ICD-10-CM | POA: Diagnosis not present

## 2018-05-04 DIAGNOSIS — I872 Venous insufficiency (chronic) (peripheral): Secondary | ICD-10-CM | POA: Diagnosis not present

## 2018-05-04 DIAGNOSIS — I110 Hypertensive heart disease with heart failure: Secondary | ICD-10-CM | POA: Diagnosis not present

## 2018-05-04 MED ORDER — POTASSIUM CHLORIDE CRYS ER 20 MEQ PO TBCR: 40.0000 meq | EXTENDED_RELEASE_TABLET | Freq: Two times a day (BID) | ORAL | 3 refills | Status: DC

## 2018-05-04 NOTE — Progress Notes (Signed)
Cardiology Office Note   Date:  05/04/2018   ID:  JAIVIN [name], DOB 1980-12-03, MRN [id number]  PCP:  Asencion Gowda.[name], MD  Cardiologist:   Chilton Si, MD   No chief complaint on file.    History of Present Illness: [name] is a 38 y.o. male with chronic heart failure type unknown, hypertension, hyperlipidemia, diabetes, morbid obesity, OSA, and venous insufficiency who presents for follow-up. [name] was admitted 1/10 through 1/26 2018 for acute heart failure exacerbation. Echocardiogram was not interpretable due to body habitus. He was diuresed with IV Lasix with a negative output of 47.2 L. His weight decreased from 631 pounds on admission to 488 pounds at discharge. His creatinine remained stable.  He followed up with [name] on 04/12/16 and was doing well. His weight was 498 pounds. His blood pressure was poorly-controlled. It was noted that carvedilol had been left off his medication list at discharge. This was restarted at that appointment. He followed up with [name] and was doing well. He was exercising and working out with a Systems analyst 3 times per week. on 07/2016.  His blood pressure was slightly elevated at that appointment, but he had come straight from the gym, so no changes were made.   Since his last appointment Mr. Verbeke has been feeling well.  He went on a cruise a couple weeks ago and didn't follow his diet.  This then his weight has been up and he notes increased edema.  He denies any shortness of breath and continues to go to the gym 3-4 days per week.  He has no exertional symptoms.  He keeps his leg wrapped in an [employer] and last changed it two days ago.  He noted an open lesion in the front and it started weeping through the dressing today.  He denies fever or chills.  He also denies pain.  He saw his PCP last week but didn't mention it.  It has been seen in the wound clinic before and has been treating it "with the same things they used  in the past.  He has been out of spironolactone for the last week.   Past Medical History:  Diagnosis Date  . Chronic venous insufficiency    a. as a premature infant, required venous access for care with subsequent DVT and some sort of procedure -> eventually went on to have vein bypass around 2005.  [name] Hypertension   . Lymphedema   . Morbid obesity (HCC)   . OSA on CPAP     Past Surgical History:  Procedure Laterality Date  . balloon stenting Right    right leg  . VEIN LIGATION AND STRIPPING       Current Outpatient Medications  Medication Sig Dispense Refill  . carvedilol (COREG) 12.5 MG tablet TAKE 1 TABLET BY MOUTH 2 TIMES DAILY. 180 tablet 3  . cyclobenzaprine (FLEXERIL) 10 MG tablet Take 1 tablet (10 mg total) 3 (three) times daily as needed by mouth for muscle spasms. 15 tablet 0  . furosemide (LASIX) 80 MG tablet TAKE 1 TABLET (80 MG TOTAL) BY MOUTH 2 (TWO) TIMES DAILY. 60 tablet 6  . HYDROcodone-acetaminophen (NORCO/VICODIN) 5-325 MG tablet Take 1 tablet by mouth every 6 (six) hours as needed for moderate pain. 30 tablet 0  . ibuprofen (ADVIL,MOTRIN) 200 MG tablet Take 800 mg by mouth every 6 (six) hours as needed for moderate pain.    [name] lisinopril (PRINIVIL,ZESTRIL) 40 MG tablet Take  1 tablet (40 mg total) by mouth daily. 90 tablet 3  . potassium chloride SA (K-DUR,KLOR-CON) 20 MEQ tablet Take 2 tablets (40 mEq total) by mouth 2 (two) times daily. 360 tablet 3  . spironolactone (ALDACTONE) 25 MG tablet Take 1 tablet (25 mg total) by mouth daily. 90 tablet 3   No current facility-administered medications for this visit.     Allergies:   Lidocaine and Peanut-containing drug products    Social History:  The patient  reports that he has never smoked. He has never used smokeless tobacco. He reports current alcohol use of about 4.0 standard drinks of alcohol per week. He reports that he does not use drugs.   Family History:  The patient's family history includes Cancer in  his father, maternal grandfather, and maternal grandmother; Diabetes in his mother.    ROS:  Please see the history of present illness.   Otherwise, review of systems are positive for none.   All other systems are reviewed and negative.    PHYSICAL EXAM: VS:  BP (!) 142/86   Pulse 69   Ht 6\' 2"  (1.88 m)   Wt (!) 474 lb 3.2 oz (215.1 kg)   SpO2 99%   BMI 60.88 kg/m  , BMI Body mass index is 60.88 kg/m. GENERAL:  Well appearing.  HEENT: Pupils equal round and reactive, fundi not visualized, oral mucosa unremarkable NECK:  No jugular venous distention, waveform within normal limits, carotid upstroke brisk and symmetric, no bruits LUNGS:  Clear to auscultation bilaterally HEART:  RRR.  PMI not displaced or sustained,S1 and S2 within normal limits, no S3, no S4, no clicks, no rubs, no murmurs ABD:  Flat, positive bowel sounds normal in frequency in pitch, no bruits, no rebound, no guarding, no midline pulsatile mass, no hepatomegaly, no splenomegaly EXT:  2 plus pulses throughout, no edema, no cyanosis no clubbing.  R LE with 2 inch x 6 inch ulceration with bleeding granulation tissue.  No erythema or purulence.  SKIN:  No rashes no nodules NEURO:  Cranial nerves II through XII grossly intact, motor grossly intact throughout PSYCH:  Cognitively intact, oriented to person place and time   EKG:  EKG is not ordered today. 11/05/16: Sinus rhythm.  Rate 72 bpm.  Low voltage.  03/13/16: Sinus rhythm.  Rate 80 bpm.  Nonspecific T wave abnormalities. 10/31/2017: Sinus rhythm.  Rate 85 bpm.  Prior anteroseptal infarct.  Low voltage.  Recent Labs: No results found for requested labs within last 8760 hours.    Lipid Panel No results found for: CHOL, TRIG, HDL, CHOLHDL, VLDL, LDLCALC, LDLDIRECT    Wt Readings from Last 3 Encounters:  05/04/18 (!) 474 lb 3.2 oz (215.1 kg)  10/31/17 (!) 468 lb 3.2 oz (212.4 kg)  03/13/17 (!) 480 lb (217.7 kg)      ASSESSMENT AND PLAN:  # Chronic heart  failure, type unknown: [name] is doing well.  He fell off his diet while on his cruise and his weight is up.  He has also been off spironolactone for the last week.  He appears to be euvolemic.  He will resume spironolactone today.  Continue carvedilol, lasix, and lisinopril.    # Hypertension: BP above goal.  Resume spironolactone and continue home regimen as above.  He will work on cutting back on the salt in his diet.   # LE ulcer:  Large open wound 2/2 venus stasis that is bleeding.  There is no evidence of infection.  He  needs to be seen in wound clinic asap.  We will re-refer him.  Continue compressive wrap for now.  # Morbid obesity: Mr. [name] was congratulated on his exercise efforts and weight loss.    Current medicines are reviewed at length with the patient today.  The patient does not have concerns regarding medicines.  The following changes have been made:  no change  Labs/ tests ordered today include:   No orders of the defined types were placed in this encounter.    Disposition:   FU with Theo Krumholz C. Duke Salviaandolph, MD, [facility] in 2 months.     Signed, Kalem Rockwell C. Duke Salviaandolph, MD, [facility]  05/04/2018 4:54 PM    [facility]

## 2018-05-04 NOTE — Patient Instructions (Signed)
Medication Instructions:  START SPIRONOLACTONE BACK TODAY   If you need a refill on your cardiac medications before your next appointment, please call your pharmacy.   Lab work: NONE  Testing/Procedures: NONE  Follow-Up: At [facility], you and your health needs are our priority.  As part of our continuing mission to provide you with exceptional heart care, we have created designated Provider Care Teams.  These Care Teams include your primary Cardiologist (physician) and Advanced Practice Providers (APPs -  Physician Assistants and Nurse Practitioners) who all work together to provide you with the care you need, when you need it. You will need a follow up appointment in 2 months.  WITH one of the following Advanced Practice Providers on your designated Care Team:   Corine Shelter, PA-C [place], [place] . Marjie Skiff, PA-C  Any Other Special Instructions Will Be Listed Below (If Applicable). YOU ARE SCHEDULED TO SEE WOUND CLINIC ON 05/14/18 AT 1:15. YOU CAN CALL THE OFFICE THIS WEEK TO SEE IF ANY CANCELLATIONS [phone]  CALL YOUR PRIMARY CARE TODAY OR TOMORROW MORNING TO TRY TO GET SEEN AS SOON AS POSSIBLE

## 2018-05-04 NOTE — Addendum Note (Signed)
Addended by: Regis Bill B on: 05/04/2018 05:14 PM   Modules accepted: Orders

## 2018-05-14 ENCOUNTER — Encounter (HOSPITAL_BASED_OUTPATIENT_CLINIC_OR_DEPARTMENT_OTHER): Payer: BLUE CROSS/BLUE SHIELD

## 2018-05-14 ENCOUNTER — Encounter (HOSPITAL_BASED_OUTPATIENT_CLINIC_OR_DEPARTMENT_OTHER): Payer: Self-pay

## 2018-05-22 ENCOUNTER — Encounter (HOSPITAL_BASED_OUTPATIENT_CLINIC_OR_DEPARTMENT_OTHER): Payer: BLUE CROSS/BLUE SHIELD | Attending: Internal Medicine

## 2018-05-22 ENCOUNTER — Other Ambulatory Visit: Payer: Self-pay

## 2018-05-22 DIAGNOSIS — I872 Venous insufficiency (chronic) (peripheral): Secondary | ICD-10-CM | POA: Diagnosis not present

## 2018-05-22 DIAGNOSIS — Z6841 Body Mass Index (BMI) 40.0 and over, adult: Secondary | ICD-10-CM | POA: Diagnosis not present

## 2018-05-22 DIAGNOSIS — L97812 Non-pressure chronic ulcer of other part of right lower leg with fat layer exposed: Secondary | ICD-10-CM | POA: Insufficient documentation

## 2018-05-22 DIAGNOSIS — Z95820 Peripheral vascular angioplasty status with implants and grafts: Secondary | ICD-10-CM | POA: Insufficient documentation

## 2018-05-22 DIAGNOSIS — I87331 Chronic venous hypertension (idiopathic) with ulcer and inflammation of right lower extremity: Secondary | ICD-10-CM | POA: Diagnosis not present

## 2018-05-22 DIAGNOSIS — E119 Type 2 diabetes mellitus without complications: Secondary | ICD-10-CM | POA: Diagnosis not present

## 2018-05-22 DIAGNOSIS — I509 Heart failure, unspecified: Secondary | ICD-10-CM | POA: Diagnosis not present

## 2018-05-22 DIAGNOSIS — I890 Lymphedema, not elsewhere classified: Secondary | ICD-10-CM | POA: Diagnosis not present

## 2018-05-22 DIAGNOSIS — Z86718 Personal history of other venous thrombosis and embolism: Secondary | ICD-10-CM | POA: Insufficient documentation

## 2018-05-22 DIAGNOSIS — I110 Hypertensive heart disease with heart failure: Secondary | ICD-10-CM | POA: Insufficient documentation

## 2018-06-05 DIAGNOSIS — Z95820 Peripheral vascular angioplasty status with implants and grafts: Secondary | ICD-10-CM | POA: Diagnosis not present

## 2018-06-05 DIAGNOSIS — I87331 Chronic venous hypertension (idiopathic) with ulcer and inflammation of right lower extremity: Secondary | ICD-10-CM | POA: Diagnosis not present

## 2018-06-05 DIAGNOSIS — I509 Heart failure, unspecified: Secondary | ICD-10-CM | POA: Diagnosis not present

## 2018-06-05 DIAGNOSIS — M319 Necrotizing vasculopathy, unspecified: Secondary | ICD-10-CM | POA: Diagnosis not present

## 2018-06-05 DIAGNOSIS — Z86718 Personal history of other venous thrombosis and embolism: Secondary | ICD-10-CM | POA: Diagnosis not present

## 2018-06-05 DIAGNOSIS — I872 Venous insufficiency (chronic) (peripheral): Secondary | ICD-10-CM | POA: Diagnosis not present

## 2018-06-05 DIAGNOSIS — L97812 Non-pressure chronic ulcer of other part of right lower leg with fat layer exposed: Secondary | ICD-10-CM | POA: Diagnosis not present

## 2018-06-05 DIAGNOSIS — Z6841 Body Mass Index (BMI) 40.0 and over, adult: Secondary | ICD-10-CM | POA: Diagnosis not present

## 2018-06-05 DIAGNOSIS — I890 Lymphedema, not elsewhere classified: Secondary | ICD-10-CM | POA: Diagnosis not present

## 2018-06-05 DIAGNOSIS — I110 Hypertensive heart disease with heart failure: Secondary | ICD-10-CM | POA: Diagnosis not present

## 2018-06-05 DIAGNOSIS — E119 Type 2 diabetes mellitus without complications: Secondary | ICD-10-CM | POA: Diagnosis not present

## 2018-06-19 ENCOUNTER — Encounter (HOSPITAL_BASED_OUTPATIENT_CLINIC_OR_DEPARTMENT_OTHER): Payer: BLUE CROSS/BLUE SHIELD | Attending: Internal Medicine

## 2018-06-19 DIAGNOSIS — I890 Lymphedema, not elsewhere classified: Secondary | ICD-10-CM | POA: Diagnosis not present

## 2018-06-19 DIAGNOSIS — M319 Necrotizing vasculopathy, unspecified: Secondary | ICD-10-CM | POA: Diagnosis not present

## 2018-06-19 DIAGNOSIS — E119 Type 2 diabetes mellitus without complications: Secondary | ICD-10-CM | POA: Insufficient documentation

## 2018-06-19 DIAGNOSIS — Z86718 Personal history of other venous thrombosis and embolism: Secondary | ICD-10-CM | POA: Diagnosis not present

## 2018-06-19 DIAGNOSIS — L97812 Non-pressure chronic ulcer of other part of right lower leg with fat layer exposed: Secondary | ICD-10-CM | POA: Diagnosis not present

## 2018-06-19 DIAGNOSIS — I10 Essential (primary) hypertension: Secondary | ICD-10-CM | POA: Diagnosis not present

## 2018-06-26 ENCOUNTER — Other Ambulatory Visit: Payer: Self-pay | Admitting: Cardiovascular Disease

## 2018-06-26 DIAGNOSIS — M7661 Achilles tendinitis, right leg: Secondary | ICD-10-CM | POA: Diagnosis not present

## 2018-06-26 DIAGNOSIS — I10 Essential (primary) hypertension: Secondary | ICD-10-CM | POA: Diagnosis not present

## 2018-06-26 DIAGNOSIS — L97812 Non-pressure chronic ulcer of other part of right lower leg with fat layer exposed: Secondary | ICD-10-CM | POA: Diagnosis not present

## 2018-06-26 DIAGNOSIS — Z86718 Personal history of other venous thrombosis and embolism: Secondary | ICD-10-CM | POA: Diagnosis not present

## 2018-06-26 DIAGNOSIS — E119 Type 2 diabetes mellitus without complications: Secondary | ICD-10-CM | POA: Diagnosis not present

## 2018-06-26 DIAGNOSIS — S81801A Unspecified open wound, right lower leg, initial encounter: Secondary | ICD-10-CM | POA: Diagnosis not present

## 2018-06-26 NOTE — Telephone Encounter (Signed)
Furosemide 80 mg refilled. 

## 2018-07-07 ENCOUNTER — Telehealth (INDEPENDENT_AMBULATORY_CARE_PROVIDER_SITE_OTHER): Payer: BLUE CROSS/BLUE SHIELD | Admitting: Cardiology

## 2018-07-07 ENCOUNTER — Telehealth: Payer: Self-pay

## 2018-07-07 ENCOUNTER — Encounter: Payer: Self-pay | Admitting: Cardiology

## 2018-07-07 VITALS — Ht 74.0 in | Wt >= 6400 oz

## 2018-07-07 DIAGNOSIS — I83012 Varicose veins of right lower extremity with ulcer of calf: Secondary | ICD-10-CM | POA: Diagnosis not present

## 2018-07-07 DIAGNOSIS — I509 Heart failure, unspecified: Secondary | ICD-10-CM

## 2018-07-07 DIAGNOSIS — I10 Essential (primary) hypertension: Secondary | ICD-10-CM | POA: Diagnosis not present

## 2018-07-07 DIAGNOSIS — L97212 Non-pressure chronic ulcer of right calf with fat layer exposed: Secondary | ICD-10-CM

## 2018-07-07 NOTE — Patient Instructions (Addendum)
Medication Instructions:  Your physician recommends that you continue on your current medications as directed. Please refer to the Current Medication list given to you today. If you need a refill on your cardiac medications before your next appointment, please call your pharmacy.   Lab work: Your physician recommends that you return for lab work in: 1-2 weeks BMET(YOU DO NOT HAVE TO FAST) If you have labs (blood work) drawn today and your tests are completely normal, you will receive your results only by: [name] MyChart Message (if you have MyChart) OR . A paper copy in the mail If you have any lab test that is abnormal or we need to change your treatment, we will call you to review the results.  Testing/Procedures: None   Follow-Up: At [facility], you and your health needs are our priority.  As part of our continuing mission to provide you with exceptional heart care, we have created designated Provider Care Teams.  These Care Teams include your primary Cardiologist (physician) and Advanced Practice Providers (APPs -  Physician Assistants and Nurse Practitioners) who all work together to provide you with the care you need, when you need it. You will need a follow up appointment in 6 months.  Please call our office 2 months (August) in advance to schedule this appointment.  You may see Chilton Si, MD or one of the following Advanced Practice Providers on your designated Care Team:   Corine Shelter, PA-C [place], [place] . Marjie Skiff, PA-C  Any Other Special Instructions Will Be Listed Below (If Applicable).

## 2018-07-07 NOTE — Telephone Encounter (Signed)
Virtual Visit Pre-Appointment Phone Call  "[name], I am calling you today to discuss your upcoming appointment. We are currently trying to limit exposure to the virus that causes COVID-19 by seeing patients at home rather than in the office."  1. "What is the BEST phone number to call the day of the visit?" - include this in appointment notes  2. "Do you have or have access to (through a family member/friend) a smartphone with video capability that we can use for your visit?" a. If yes - list this number in appt notes as "cell" (if different from BEST phone #) and list the appointment type as a VIDEO visit in appointment notes b. If no - list the appointment type as a PHONE visit in appointment notes  3. Confirm consent - "In the setting of the current Covid19 crisis, you are scheduled for a VIDEO visit with your provider on 07/07/2018 at 3PM.  Just as we do with many in-office visits, in order for you to participate in this visit, we must obtain consent.  If you'd like, I can send this to your mychart (if signed up) or email for you to review.  Otherwise, I can obtain your verbal consent now.  All virtual visits are billed to your insurance company just like a normal visit would be.  By agreeing to a virtual visit, we'd like you to understand that the technology does not allow for your provider to perform an examination, and thus may limit your provider's ability to fully assess your condition. If your provider identifies any concerns that need to be evaluated in person, we will make arrangements to do so.  Finally, though the technology is pretty good, we cannot assure that it will always work on either your or our end, and in the setting of a video visit, we may have to convert it to a phone-only visit.  In either situation, we cannot ensure that we have a secure connection.  Are you willing to proceed?" STAFF: Did the patient verbally acknowledge consent to telehealth visit? Document YES/NO here:  YES  4. Advise patient to be prepared - "Two hours prior to your appointment, go ahead and check your blood pressure, pulse, oxygen saturation, and your weight (if you have the equipment to check those) and write them all down. When your visit starts, your provider will ask you for this information. If you have an Apple Watch or Kardia device, please plan to have heart rate information ready on the day of your appointment. Please have a pen and paper handy nearby the day of the visit as well."  5. Give patient instructions for MyChart download to smartphone OR [email or web address] as below if video visit (depending on what platform provider is using)  6. Inform patient they will receive a phone call 15 minutes prior to their appointment time (may be from unknown caller ID) so they should be prepared to answer    TELEPHONE CALL NOTE  [name] has been deemed a candidate for a follow-up tele-health visit to limit community exposure during the Covid-19 pandemic. I spoke with the patient via phone to ensure availability of phone/video source, confirm preferred email & phone number, and discuss instructions and expectations.  I reminded [name] to be prepared with any vital sign and/or heart rhythm information that could potentially be obtained via home monitoring, at the time of his visit. I reminded [name] to expect a phone call prior to his  visit.  Lucita FerraraYoung, Devora Tortorella T, CMA 07/07/2018 2:46 PM   INSTRUCTIONS FOR DOWNLOADING THE MYCHART APP TO SMARTPHONE  - The patient must first make sure to have activated MyChart and know their login information - If Apple, go to [employer] Store and type in MyChart in the search bar and download the app. If Android, ask patient to go to [employer] Play Store and type in [place] in the search bar and download the app. The app is free but as with any other app downloads, their phone may require them to verify saved payment information or Apple/Android password.  -  The patient will need to then log into the app with their MyChart username and password, and select  as their healthcare provider to link the account. When it is time for your visit, go to the MyChart app, find appointments, and click Begin Video Visit. Be sure to Select Allow for your device to access the Microphone and Camera for your visit. You will then be connected, and your provider will be with you shortly.  **If they have any issues connecting, or need assistance please contact MyChart service desk [phone])**  **If using a computer, in order to ensure the best quality for their visit they will need to use either of the following Internet Browsers: [employer] Edge, or Google Chrome**  IF USING DOXIMITY or DOXY.ME - The patient will receive a link just prior to their visit by text.     FULL LENGTH CONSENT FOR TELE-HEALTH VISIT   I hereby voluntarily request, consent and authorize [facility] and its employed or contracted physicians, physician assistants, nurse practitioners or other licensed health care professionals (the Practitioner), to provide me with telemedicine health care services (the "Services") as deemed necessary by the treating Practitioner. I acknowledge and consent to receive the Services by the Practitioner via telemedicine. I understand that the telemedicine visit will involve communicating with the Practitioner through live audiovisual communication technology and the disclosure of certain medical information by electronic transmission. I acknowledge that I have been given the opportunity to request an in-person assessment or other available alternative prior to the telemedicine visit and am voluntarily participating in the telemedicine visit.  I understand that I have the right to withhold or withdraw my consent to the use of telemedicine in the course of my care at any time, without affecting my right to future care or treatment, and that the  Practitioner or I may terminate the telemedicine visit at any time. I understand that I have the right to inspect all information obtained and/or recorded in the course of the telemedicine visit and may receive copies of available information for a reasonable fee.  I understand that some of the potential risks of receiving the Services via telemedicine include:  [name]. Delay or interruption in medical evaluation due to technological equipment failure or disruption; . Information transmitted may not be sufficient (e.g. poor resolution of images) to allow for appropriate medical decision making by the Practitioner; and/or  . In rare instances, security protocols could fail, causing a breach of personal health information.  Furthermore, I acknowledge that it is my responsibility to provide information about my medical history, conditions and care that is complete and accurate to the best of my ability. I acknowledge that Practitioner's advice, recommendations, and/or decision may be based on factors not within their control, such as incomplete or inaccurate data provided by me or distortions of diagnostic images or specimens that may result from electronic transmissions. I  understand that the practice of medicine is not an exact science and that Practitioner makes no warranties or guarantees regarding treatment outcomes. I acknowledge that I will receive a copy of this consent concurrently upon execution via email to the email address I last provided but may also request a printed copy by calling the office of [facility].    I understand that my insurance will be billed for this visit.   I have read or had this consent read to me. . I understand the contents of this consent, which adequately explains the benefits and risks of the Services being provided via telemedicine.  . I have been provided ample opportunity to ask questions regarding this consent and the Services and have had my questions answered to my  satisfaction. . I give my informed consent for the services to be provided through the use of telemedicine in my medical care  By participating in this telemedicine visit I agree to the above.

## 2018-07-07 NOTE — Telephone Encounter (Signed)
Contacted patient and reviewed AVS instructions. Patient voiced understanding.

## 2018-07-07 NOTE — Progress Notes (Signed)
Virtual Visit via Video Note   This visit type was conducted due to national recommendations for restrictions regarding the COVID-19 Pandemic (e.g. social distancing) in an effort to limit this patient's exposure and mitigate transmission in our community.  Due to his co-morbid illnesses, this patient is at least at moderate risk for complications without adequate follow up.  This format is felt to be most appropriate for this patient at this time.  All issues noted in this document were discussed and addressed.  A limited physical exam was performed with this format.  Please refer to the patient's chart for his consent to telehealth for [facility].  Evaluation Performed:  Follow-up visit  This visit type was conducted due to national recommendations for restrictions regarding the COVID-19 Pandemic (e.g. social distancing).  This format is felt to be most appropriate for this patient at this time.  All issues noted in this document were discussed and addressed.  No physical exam was performed (except for noted visual exam findings with Video Visits).  Please refer to the patient's chart (MyChart message for video visits and phone note for telephone visits) for the patient's consent to telehealth for [facility].  Date:  07/07/2018   ID:  [name], DOB 10-22-1980, MRN [id number]  Patient Location: Home [address] [place] [address]   Provider location:   Home- [place]  PCP:  Clovis Riley, L.[name], MD  Cardiologist:  Chilton Si, MD  Electrophysiologist:  None   Chief Complaint:  Follow up office visit  History of Present Illness:    [name] is a 38 y.o. male who presents via audio/video conferencing for a telehealth visit today.  The patient has a history of congestive heart failure of as yet undetermined origin.  He was admitted to the hospital in January 2018 with acute respiratory failure and congestive heart failure.  His weight on admission was 631 pounds,  by discharge she was down to 488 pounds.  An echocardiogram was done but was not interpretable because of his weight  He is actually done pretty well since then.  He has worked to lose weight and has steadily taken some off.  He has had some issues with medications from time to time but currently says he is on all the medications he was prescribed.  His weight is down to 448 pounds, his last office visit in February 2020 he was 474 pounds.  He has had chronic venous insufficiency and had a venous stasis ulcer.  He tells me this is completely healed now.  He denies any unusual dyspnea.  He has been avoiding crowds and practicing social distancing.  He has had no problems with his medications as far as he knows.  The patient does not symptoms concerning for COVID-19 infection (fever, chills, cough, or new SHORTNESS OF BREATH).    Prior CV studies:   The following studies were reviewed today:  Past Medical History:  Diagnosis Date  . Chronic venous insufficiency    a. as a premature infant, required venous access for care with subsequent DVT and some sort of procedure -> eventually went on to have vein bypass around 2005.  [name] Hypertension   . Lymphedema   . Morbid obesity (HCC)   . OSA on CPAP    Past Surgical History:  Procedure Laterality Date  . balloon stenting Right    right leg  . VEIN LIGATION AND STRIPPING       Current Meds  Medication Sig  .  carvedilol (COREG) 12.5 MG tablet TAKE 1 TABLET BY MOUTH 2 TIMES DAILY.  . cyclobenzaprine (FLEXERIL) 10 MG tablet Take 1 tablet (10 mg total) 3 (three) times daily as needed by mouth for muscle spasms.  . furosemide (LASIX) 80 MG tablet TAKE 1 TABLET (80 MG TOTAL) BY MOUTH 2 (TWO) TIMES DAILY.  [name] HYDROcodone-acetaminophen (NORCO/VICODIN) 5-325 MG tablet Take 1 tablet by mouth every 6 (six) hours as needed for moderate pain.  [name] ibuprofen (ADVIL,MOTRIN) 200 MG tablet Take 800 mg by mouth every 6 (six) hours as needed for moderate pain.  [name]  lisinopril (PRINIVIL,ZESTRIL) 40 MG tablet Take 1 tablet (40 mg total) by mouth daily.  . potassium chloride SA (K-DUR,KLOR-CON) 20 MEQ tablet Take 2 tablets (40 mEq total) by mouth 2 (two) times daily.  [name] spironolactone (ALDACTONE) 25 MG tablet Take 1 tablet (25 mg total) by mouth daily.     Allergies:   Lidocaine and Peanut-containing drug products   Social History   Tobacco Use  . Smoking status: Never Smoker  . Smokeless tobacco: Never Used  Substance Use Topics  . Alcohol use: Yes    Alcohol/week: 4.0 standard drinks    Types: 4 Standard drinks or equivalent per week    Comment: liquor - pt states once a week  . Drug use: No     Family Hx: The patient's family history includes Cancer in his father, maternal grandfather, and maternal grandmother; Diabetes in his mother.  ROS:   Please see the history of present illness.    All other systems reviewed and are negative.   Labs/Other Tests and Data Reviewed:    Recent Labs: No results found for requested labs within last 8760 hours.   Recent Lipid Panel No results found for: CHOL, TRIG, HDL, CHOLHDL, LDLCALC, LDLDIRECT  Wt Readings from Last 3 Encounters:  07/07/18 (!) 448 lb (203.2 kg)  05/04/18 (!) 474 lb 3.2 oz (215.1 kg)  10/31/17 (!) 468 lb 3.2 oz (212.4 kg)     Exam:    Vital Signs:  Ht 6\' 2"  (1.88 m)   Wt (!) 448 lb (203.2 kg)   BMI 57.52 kg/m    Obese well developed AA male in no acute distress.  ASSESSMENT & PLAN:    Chronic CHF- Etiology not yet determined, echo unreadable secondary to obesity.  Symptomatically he is doing well.  Morbid obesity- He continues to loose weight- I'll check a BMP to make sure he is not overly diuresed though he has no symptoms of this.  HTN- Unable to assess B/P  Venous ulcer- The patient tells me this has "completley healed"  COVID-19 Education: The signs and symptoms of COVID-19 were discussed with the patient and how to seek care for testing (follow up with  PCP or arrange E-visit).  The importance of social distancing was discussed today.  Patient Risk:   After full review of this patients clinical status, I feel that they are at least moderate risk at this time.  Time:   Today, I have spent 10 minutes with the patient with telehealth technology discussing medications   Medication Adjustments/Labs and Tests Ordered: Current medicines are reviewed at length with the patient today.  Concerns regarding medicines are outlined above.  Tests Ordered: No orders of the defined types were placed in this encounter.  Medication Changes: No orders of the defined types were placed in this encounter.   Disposition:  Check BMP- f/u Dr Duke Salvia in 6 months  Signed, Corine Shelter, PA-C  07/07/2018 3:07 PM     [facility]

## 2018-08-11 DIAGNOSIS — G4733 Obstructive sleep apnea (adult) (pediatric): Secondary | ICD-10-CM | POA: Diagnosis not present
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
## 2018-12-12 ENCOUNTER — Other Ambulatory Visit: Payer: Self-pay | Admitting: Cardiovascular Disease

## 2018-12-18 ENCOUNTER — Telehealth: Payer: Self-pay | Admitting: Cardiovascular Disease

## 2018-12-18 NOTE — Telephone Encounter (Signed)
Returned pt's call, leaving a message, informing pt that his medication is waiting for him to pick up at his pharmacy [facility] on [address] and if he has any other problems, questions or concerns to give our office a call back.

## 2019-02-10 ENCOUNTER — Ambulatory Visit (HOSPITAL_COMMUNITY)
Admission: EM | Admit: 2019-02-10 | Discharge: 2019-02-10 | Disposition: A | Discharge status: Home / Self Care | Payer: 59 | Attending: Family Medicine | Admitting: Family Medicine

## 2019-02-10 ENCOUNTER — Encounter (HOSPITAL_COMMUNITY): Payer: Self-pay

## 2019-02-10 ENCOUNTER — Other Ambulatory Visit: Payer: Self-pay

## 2019-02-10 DIAGNOSIS — S81001A Unspecified open wound, right knee, initial encounter: Secondary | ICD-10-CM

## 2019-02-10 DIAGNOSIS — M5442 Lumbago with sciatica, left side: Secondary | ICD-10-CM

## 2019-02-10 MED ORDER — PREDNISONE 10 MG (21) PO TBPK: ORAL_TABLET | ORAL | 0 refills | Status: DC

## 2019-02-10 MED ORDER — CYCLOBENZAPRINE HCL 10 MG PO TABS: 10.0000 mg | ORAL_TABLET | Freq: Two times a day (BID) | ORAL | 0 refills | Status: DC | PRN

## 2019-02-10 MED ORDER — BACITRACIN ZINC 500 UNIT/GM EX OINT: 1.0000 "application " | TOPICAL_OINTMENT | Freq: Two times a day (BID) | CUTANEOUS | 0 refills | Status: DC
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
## 2019-04-07 ENCOUNTER — Other Ambulatory Visit: Payer: Self-pay | Admitting: Chiropractor

## 2019-04-07 DIAGNOSIS — M25572 Pain in left ankle and joints of left foot: Secondary | ICD-10-CM

## 2019-06-23 ENCOUNTER — Ambulatory Visit: Payer: Self-pay | Admitting: Physician Assistant

## 2019-06-23 VITALS — BP 130/78 | HR 98 | Temp 98.7°F | Resp 20 | Ht 74.0 in | Wt >= 6400 oz

## 2019-06-23 DIAGNOSIS — I10 Essential (primary) hypertension: Secondary | ICD-10-CM

## 2019-06-23 DIAGNOSIS — R7303 Prediabetes: Secondary | ICD-10-CM

## 2019-06-23 DIAGNOSIS — Z79899 Other long term (current) drug therapy: Secondary | ICD-10-CM

## 2019-06-23 DIAGNOSIS — Z13220 Encounter for screening for lipoid disorders: Secondary | ICD-10-CM

## 2019-06-23 DIAGNOSIS — R0789 Other chest pain: Secondary | ICD-10-CM

## 2019-06-23 DIAGNOSIS — Z113 Encounter for screening for infections with a predominantly sexual mode of transmission: Secondary | ICD-10-CM

## 2019-06-23 DIAGNOSIS — E876 Hypokalemia: Secondary | ICD-10-CM

## 2019-06-23 DIAGNOSIS — I509 Heart failure, unspecified: Secondary | ICD-10-CM

## 2019-06-23 DIAGNOSIS — Z7251 High risk heterosexual behavior: Secondary | ICD-10-CM

## 2019-06-23 DIAGNOSIS — Z6841 Body Mass Index (BMI) 40.0 and over, adult: Secondary | ICD-10-CM

## 2019-06-23 LAB — POCT GLYCOSYLATED HEMOGLOBIN (HGB A1C): Hemoglobin A1C: 6.2 % — AB (ref 4.0–5.6)

## 2019-06-23 MED ORDER — FUROSEMIDE 80 MG PO TABS: 80.0000 mg | ORAL_TABLET | Freq: Two times a day (BID) | ORAL | 0 refills | Status: DC

## 2019-06-23 MED ORDER — SPIRONOLACTONE 25 MG PO TABS: 25.0000 mg | ORAL_TABLET | Freq: Every day | ORAL | 0 refills | Status: DC

## 2019-06-23 MED ORDER — CARVEDILOL 12.5 MG PO TABS: 12.5000 mg | ORAL_TABLET | Freq: Two times a day (BID) | ORAL | 0 refills | Status: DC

## 2019-06-23 MED ORDER — LISINOPRIL 40 MG PO TABS: 40.0000 mg | ORAL_TABLET | Freq: Every day | ORAL | 0 refills | Status: DC

## 2019-06-23 MED ORDER — POTASSIUM CHLORIDE CRYS ER 20 MEQ PO TBCR: 40.0000 meq | EXTENDED_RELEASE_TABLET | Freq: Two times a day (BID) | ORAL | 0 refills | Status: DC

## 2019-06-23 NOTE — Progress Notes (Signed)
New Patient Office Visit  Subjective:  Patient ID: [name], male    DOB: [birth year]  Age: 39 y.o. MRN: [id number]  CC:  Chief Complaint  Patient presents with  . Hypertension  . Medication Refill    HPI [name] presents for medication refills, fluttering in his chest, STI testing.  Patient reports that he has not had a primary care provider in a while, states that he has been taking his medication as directed with the exception of his potassium which he says he has been out for the last month.  Does not check blood pressure at home, denies any hypertensive symptoms.  Reports that he has been feeling a fluttering mid chest, states it has been occurring several times a day for the last few days.  Denies chest pain, shortness of breath, or nausea.  Reports that he has had some increased stresses, is currently looking for new employment.  Reports that he has had some episodes of acid reflux in the past month, will use Tums on occasion with relief.  Request STI screening, due to recent encounter, denies any STI symptoms.  Past Medical History:  Diagnosis Date  . Chronic venous insufficiency    a. as a premature infant, required venous access for care with subsequent DVT and some sort of procedure -> eventually went on to have vein bypass around 2005.  [name] Hypertension   . Lymphedema   . Morbid obesity ([place])   . OSA on CPAP     Past Surgical History:  Procedure Laterality Date  . balloon stenting Right    right leg  . VEIN LIGATION AND STRIPPING      Family History  Problem Relation Age of Onset  . Diabetes Mother   . Cancer Father        colon  . Cancer Maternal Grandmother   . Cancer Maternal Grandfather     Social History   Socioeconomic History  . Marital status: Single    Spouse name: Not on file  . Number of children: Not on file  . Years of education: Not on file  . Highest education level: Not on file  Occupational History  . Not on file  Tobacco  Use  . Smoking status: Never Smoker  . Smokeless tobacco: Never Used  Substance and Sexual Activity  . Alcohol use: Yes    Alcohol/week: 4.0 standard drinks    Types: 4 Standard drinks or equivalent per week    Comment: liquor - pt states once a week  . Drug use: No  . Sexual activity: Not on file  Other Topics Concern  . Not on file  Social History Narrative  . Not on file   Social Determinants of Health   Financial Resource Strain:   . Difficulty of Paying Living Expenses:   Food Insecurity:   . Worried About Charity fundraiser in the Last Year:   . Arboriculturist in the Last Year:   Transportation Needs:   . Film/video editor (Medical):   [name] Lack of Transportation (Non-Medical):   Physical Activity:   . Days of Exercise per Week:   . Minutes of Exercise per Session:   Stress:   . Feeling of Stress :   Social Connections:   . Frequency of Communication with Friends and Family:   . Frequency of Social Gatherings with Friends and Family:   . Attends Religious Services:   . Active Member of Clubs or Organizations:   .  Attends Archivist Meetings:   [name] Marital Status:   Intimate Partner Violence:   . Fear of Current or Ex-Partner:   . Emotionally Abused:   [name] Physically Abused:   . Sexually Abused:     ROS Review of Systems  Constitutional: Negative.   HENT: Negative.   Eyes: Negative.   Respiratory: Negative for cough, chest tightness and shortness of breath.   Cardiovascular: Positive for palpitations. Negative for chest pain.  Gastrointestinal: Negative.   Endocrine: Negative.   Genitourinary: Negative.   Musculoskeletal: Negative.   Skin: Negative.   Allergic/Immunologic: Negative.   Neurological: Negative for dizziness, syncope and headaches.  Hematological: Negative.   Psychiatric/Behavioral: Negative.     Objective:   Today's Vitals: BP 130/78 (BP Location: Left Arm, Patient Position: Sitting, Cuff Size: Large)   Pulse 98   Temp  98.7 F (37.1 C) (Oral)   Resp 20   Ht 6' 2"  (1.88 m)   Wt (!) 468 lb (212.3 kg)   SpO2 100%   BMI 60.09 kg/m   Physical Exam Vitals and nursing note reviewed.  Constitutional:      General: He is not in acute distress.    Appearance: Normal appearance. He is obese. He is not ill-appearing.  HENT:     Head: Normocephalic and atraumatic.     Right Ear: External ear normal.     Left Ear: External ear normal.  Cardiovascular:     Pulses: Normal pulses.     Comments: Difficult to hear due to body habitus Pulmonary:     Effort: Pulmonary effort is normal.     Breath sounds: Normal breath sounds.  Abdominal:     General: Abdomen is flat. Bowel sounds are normal.     Palpations: Abdomen is soft.  Musculoskeletal:        General: Normal range of motion.     Cervical back: Normal range of motion and neck supple.  Skin:    General: Skin is warm and dry.  Neurological:     General: No focal deficit present.     Mental Status: He is alert and oriented to person, place, and time.  Psychiatric:        Mood and Affect: Mood normal.        Behavior: Behavior normal.        Thought Content: Thought content normal.        Judgment: Judgment normal.     Assessment & Plan:   Problem List Items Addressed This Visit      Cardiovascular and Mediastinum   Hypertension - Primary (Chronic)   Relevant Medications   carvedilol (COREG) 12.5 MG tablet   furosemide (LASIX) 80 MG tablet   lisinopril (ZESTRIL) 40 MG tablet   potassium chloride SA (KLOR-CON) 20 MEQ tablet   spironolactone (ALDACTONE) 25 MG tablet   Chronic congestive heart failure (HCC)   Relevant Medications   carvedilol (COREG) 12.5 MG tablet   furosemide (LASIX) 80 MG tablet   lisinopril (ZESTRIL) 40 MG tablet   spironolactone (ALDACTONE) 25 MG tablet     Other   Class 3 severe obesity due to excess calories with serious comorbidity and body mass index (BMI) of 60.0 to 69.9 in adult [facility])   Relevant Medications    metFORMIN (GLUCOPHAGE) 500 MG tablet   Hypokalemia   Prediabetes   Relevant Medications   metFORMIN (GLUCOPHAGE) 500 MG tablet   glucose blood (ACCU-CHEK AVIVA PLUS) test strip   Blood Glucose Monitoring Suppl (  ACCU-CHEK AVIVA) device   Accu-Chek Softclix Lancets lancets   Other Relevant Orders   HgB A1c (Completed)    Other Visit Diagnoses    Screening for lipid disorders       Relevant Orders   Lipid panel (Completed)   Encounter for screening examination for sexually transmitted disease       Relevant Orders   RPR (Completed)   HIV antibody (with reflex) (Completed)   Other chest pain       Relevant Orders   TSH (Completed)   EKG 12-Lead (Completed)   Encounter for [place]-term (current) use of medications       Relevant Orders   CBC with Differential/Platelet (Completed)   Comp. Metabolic Panel (12) (Completed)   Unprotected sex       Relevant Orders   Cytology, urine (performed at [facility])      Outpatient Encounter Medications as of 06/23/2019  Medication Sig  . carvedilol (COREG) 12.5 MG tablet Take 1 tablet (12.5 mg total) by mouth 2 (two) times daily.  . furosemide (LASIX) 80 MG tablet Take 1 tablet (80 mg total) by mouth 2 (two) times daily.  [name] lisinopril (ZESTRIL) 40 MG tablet Take 1 tablet (40 mg total) by mouth daily.  . potassium chloride SA (KLOR-CON) 20 MEQ tablet Take 2 tablets (40 mEq total) by mouth 2 (two) times daily.  [name] spironolactone (ALDACTONE) 25 MG tablet Take 1 tablet (25 mg total) by mouth daily.  . [DISCONTINUED] carvedilol (COREG) 12.5 MG tablet TAKE 1 TABLET BY MOUTH 2 TIMES DAILY.  . [DISCONTINUED] cyclobenzaprine (FLEXERIL) 10 MG tablet Take 1 tablet (10 mg total) by mouth 2 (two) times daily as needed for muscle spasms.  . [DISCONTINUED] furosemide (LASIX) 80 MG tablet TAKE 1 TABLET (80 MG TOTAL) BY MOUTH 2 (TWO) TIMES DAILY.  . [DISCONTINUED] ibuprofen (ADVIL,MOTRIN) 200 MG tablet Take 800 mg by mouth every 6 (six) hours as needed for moderate  pain.  . [DISCONTINUED] lisinopril (PRINIVIL,ZESTRIL) 40 MG tablet Take 1 tablet (40 mg total) by mouth daily.  . [DISCONTINUED] potassium chloride SA (K-DUR,KLOR-CON) 20 MEQ tablet Take 2 tablets (40 mEq total) by mouth 2 (two) times daily.  . [DISCONTINUED] spironolactone (ALDACTONE) 25 MG tablet Take 1 tablet (25 mg total) by mouth daily.  . Accu-Chek Softclix Lancets lancets Use as instructed  . bacitracin ointment Apply 1 application topically 2 (two) times daily. (Patient not taking: Reported on 06/23/2019)  . Blood Glucose Monitoring Suppl (ACCU-CHEK AVIVA) device Use as instructed  . glucose blood (ACCU-CHEK AVIVA PLUS) test strip Use as instructed  . metFORMIN (GLUCOPHAGE) 500 MG tablet Take 1 tablet (500 mg total) by mouth daily with breakfast.  . [DISCONTINUED] predniSONE (STERAPRED UNI-PAK 21 TAB) 10 MG (21) TBPK tablet 6 tabs for 1 day, then 5 tabs for 1 das, then 4 tabs for 1 day, then 3 tabs for 1 day, 2 tabs for 1 day, then 1 tab for 1 day   No facility-administered encounter medications on file as of 06/23/2019.   1. Essential hypertension Upon review of patient's chart, his last office visit with cardiologist was in April 2020.  He does have a significant history of chronic heart failure type unknown, hypertension, hyperlipidemia, diabetes, morbid obesity, obstructive sleep apnea, and venous insufficiency.  He was admitted to the hospital in January 2018 for acute heart failure exacerbation.  Did see a reading of hemoglobin A1c in 2017 of 6.6.  Will recheck today, patient is not on any medication for diabetes  Patient was given an appointment to establish primary care provider at [facility].  - carvedilol (COREG) 12.5 MG tablet; Take 1 tablet (12.5 mg total) by mouth 2 (two) times daily.  Dispense: 180 tablet; Refill: 0 - furosemide (LASIX) 80 MG tablet; Take 1 tablet (80 mg total) by mouth 2 (two) times daily.  Dispense: 180 tablet; Refill: 0 - lisinopril (ZESTRIL)  40 MG tablet; Take 1 tablet (40 mg total) by mouth daily.  Dispense: 90 tablet; Refill: 0 - potassium chloride SA (KLOR-CON) 20 MEQ tablet; Take 2 tablets (40 mEq total) by mouth 2 (two) times daily.  Dispense: 360 tablet; Refill: 0 - spironolactone (ALDACTONE) 25 MG tablet; Take 1 tablet (25 mg total) by mouth daily.  Dispense: 90 tablet; Refill: 0  2. Hypokalemia Patient reported he has been out of potassium for the last month, reports chest fluttering, EKG completed, difficult to hear heart sounds due to body habitus   3. Chronic congestive heart failure, unspecified heart failure type ([place])   4. Screening for lipid disorders Patient endorsed that he has not eaten in the past 6-1/2 hours. - Lipid panel  5. Encounter for screening examination for sexually transmitted disease  - Cytology - PAP([place]) - RPR - HIV antibody (with reflex)  6. Class 3 severe obesity due to excess calories with serious comorbidity and body mass index (BMI) of 60.0 to 69.9 in adult (HCC)   7. Other chest pain  - TSH  8. Encounter for [place]-term (current) use of medications  - CBC with Differential/Platelet - Comp. Metabolic Panel (12)  9. Pre-diabetes  patient has previous A1c of 6.6 in the past, has diabetes type 2 on his problem list, does state that he was never treated for this and his A1c did improve after he was discharged from the hospital.  His A1c today is 6.2, patient does agree to trial Metformin 500 mg once daily, prescription given for blood glucose monitoring kit, encouraged to check blood glucose and keep a log and take with him to primary care office visit.  Patient education given on ways to help improve his A1C.  - HgB A1c - metFORMIN (GLUCOPHAGE) 500 MG tablet; Take 1 tablet (500 mg total) by mouth daily with breakfast.  Dispense: 90 tablet; Refill: 0 - glucose blood (ACCU-CHEK AVIVA PLUS) test strip; Use as instructed  Dispense: 100 each; Refill: 12 - Blood Glucose Monitoring  Suppl (ACCU-CHEK AVIVA) device; Use as instructed  Dispense: 1 each; Refill: 0 - Accu-Chek Softclix Lancets lancets; Use as instructed  Dispense: 100 each; Refill: 12     I have reviewed the patient's medical history (PMH, PSH, Social History, Family History, Medications, and allergies) , and have been updated if relevant. I spent 40 minutes reviewing chart and  face to face time with patient.    Follow-up: Return in 7 weeks (on 08/12/2019) for To establish PCP with Dr. Juleen China.   Loraine Grip Mayers, PA-C

## 2019-06-23 NOTE — Patient Instructions (Addendum)
Make sure to sign up for my chart, so you were able to see your test results through there, we will call you to discuss your results when they become available.  I sent your medications to your pharmacy, resume the potassium.  Your A1c was 6.2, this is considered prediabetes, it is recommended that you start Metformin 500 mg once a day in the morning.  Continue to keep a log of your chest flutters, take this with you to your primary care provider's appointment for further evaluation.  If the chest discomfort becomes bothersome, you experience shortness of breath, it is best to be evaluated at the emergency department.  Thank you for allowing [place] to take care of you today!  Kennieth Rad, PA-C Physician Assistant [facility] [email or web address]      Preventing Type 2 Diabetes Mellitus Type 2 diabetes (type 2 diabetes mellitus) is a [place]-term (chronic) disease that affects blood sugar (glucose) levels. Normally, a hormone called insulin allows glucose to enter cells in the body. The cells use glucose for energy. In type 2 diabetes, one or both of these problems may be present:  The body does not make enough insulin.  The body does not respond properly to insulin that it makes (insulin resistance). Insulin resistance or lack of insulin causes excess glucose to build up in the blood instead of going into cells. As a result, high blood glucose (hyperglycemia) develops, which can cause many complications. Being overweight or obese and having an inactive (sedentary) lifestyle can increase your risk for diabetes. Type 2 diabetes can be delayed or prevented by making certain nutrition and lifestyle changes. What nutrition changes can be made?   Eat healthy meals and snacks regularly. Keep a healthy snack with you for when you get hungry between meals, such as fruit or a handful of nuts.  Eat lean meats and proteins that are low in saturated  fats, such as chicken, fish, egg whites, and beans. Avoid processed meats.  Eat plenty of fruits and vegetables and plenty of grains that have not been processed (whole grains). It is recommended that you eat: ? 1?2 cups of fruit every day. ? 2?3 cups of vegetables every day. ? 6?8 oz of whole grains every day, such as oats, whole wheat, bulgur, brown rice, quinoa, and millet.  Eat low-fat dairy products, such as milk, yogurt, and cheese.  Eat foods that contain healthy fats, such as nuts, avocado, olive oil, and canola oil.  Drink water throughout the day. Avoid drinks that contain added sugar, such as soda or sweet tea.  Follow instructions from your health care provider about specific eating or drinking restrictions.  Control how much food you eat at a time (portion size). ? Check food labels to find out the serving sizes of foods. ? Use a kitchen scale to weigh amounts of foods.  Saute or steam food instead of frying it. Cook with water or broth instead of oils or butter.  Limit your intake of: ? Salt (sodium). Have no more than 1 tsp (2,400 mg) of sodium a day. If you have heart disease or high blood pressure, have less than ? tsp (1,500 mg) of sodium a day. ? Saturated fat. This is fat that is solid at room temperature, such as butter or fat on meat. What lifestyle changes can be made? Activity   Do moderate-intensity physical activity for at least 30 minutes on at least 5 days of the week, or as much as told by  your health care provider.  Ask your health care provider what activities are safe for you. A mix of physical activities may be best, such as walking, swimming, cycling, and strength training.  Try to add physical activity into your day. For example: ? Park in spots that are farther away than usual, so that you walk more. For example, park in a far corner of the parking lot when you go to the office or the grocery store. ? Take a walk during your lunch  break. ? Use stairs instead of elevators or escalators. Weight Loss  Lose weight as directed. Your health care provider can determine how much weight loss is best for you and can help you lose weight safely.  If you are overweight or obese, you may be instructed to lose at least 5?7 % of your body weight. Alcohol and Tobacco   Limit alcohol intake to no more than 1 drink a day for nonpregnant women and 2 drinks a day for men. One drink equals 12 oz of beer, 5 oz of wine, or 1 oz of hard liquor.  Do not use any tobacco products, such as cigarettes, chewing tobacco, and e-cigarettes. If you need help quitting, ask your health care provider. Work With Your Health Care Provider  Have your blood glucose tested regularly, as told by your health care provider.  Discuss your risk factors and how you can reduce your risk for diabetes.  Get screening tests as told by your health care provider. You may have screening tests regularly, especially if you have certain risk factors for type 2 diabetes.  Make an appointment with a diet and nutrition specialist (registered dietitian). A registered dietitian can help you make a healthy eating plan and can help you understand portion sizes and food labels. Why are these changes important?  It is possible to prevent or delay type 2 diabetes and related health problems by making lifestyle and nutrition changes.  It can be difficult to recognize signs of type 2 diabetes. The best way to avoid possible damage to your body is to take actions to prevent the disease before you develop symptoms. What can happen if changes are not made?  Your blood glucose levels may keep increasing. Having high blood glucose for a Larsen time is dangerous. Too much glucose in your blood can damage your blood vessels, heart, kidneys, nerves, and eyes.  You may develop prediabetes or type 2 diabetes. Type 2 diabetes can lead to many chronic health problems and complications, such  as: ? Heart disease. ? Stroke. ? Blindness. ? Kidney disease. ? Depression. ? Poor circulation in the feet and legs, which could lead to surgical removal (amputation) in severe cases. Where to find support  Ask your health care provider to recommend a registered dietitian, diabetes educator, or weight loss program.  Look for local or online weight loss groups.  Join a gym, fitness club, or outdoor activity group, such as a walking club. Where to find more information To learn more about diabetes and diabetes prevention, visit:  American Diabetes Association (ADA): www.diabetes.[employer] of Diabetes and Digestive and Kidney Diseases: ToyArticles.ca To learn more about healthy eating, visit:  The U.S. Department of Agriculture Architect), Choose My Plate: http://yates.biz/  Office of Disease Prevention and Health Promotion (ODPHP), Dietary Guidelines: ListingMagazine.si Summary  You can reduce your risk for type 2 diabetes by increasing your physical activity, eating healthy foods, and losing weight as directed.  Talk with your health care provider  about your risk for type 2 diabetes. Ask about any blood tests or screening tests that you need to have. This information is not intended to replace advice given to you by your health care provider. Make sure you discuss any questions you have with your health care provider. Document Revised: 06/19/2018 Document Reviewed: 04/18/2015 Elsevier Patient Education  2020 [employer].

## 2019-06-24 ENCOUNTER — Encounter: Payer: Self-pay | Admitting: Physician Assistant

## 2019-06-24 DIAGNOSIS — R7303 Prediabetes: Secondary | ICD-10-CM | POA: Insufficient documentation

## 2019-06-24 LAB — CBC WITH DIFFERENTIAL/PLATELET
Basophils Absolute: 0.1 10*3/uL (ref 0.0–0.2)
Basos: 1 %
EOS (ABSOLUTE): 0.2 10*3/uL (ref 0.0–0.4)
Eos: 2 %
Hematocrit: 42.7 % (ref 37.5–51.0)
Hemoglobin: 14 g/dL (ref 13.0–17.7)
Immature Grans (Abs): 0.1 10*3/uL (ref 0.0–0.1)
Immature Granulocytes: 1 %
Lymphocytes Absolute: 1.8 10*3/uL (ref 0.7–3.1)
Lymphs: 21 %
MCH: 26 pg — ABNORMAL LOW (ref 26.6–33.0)
MCHC: 32.8 g/dL (ref 31.5–35.7)
MCV: 79 fL (ref 79–97)
Monocytes Absolute: 0.6 10*3/uL (ref 0.1–0.9)
Monocytes: 7 %
Neutrophils Absolute: 6.1 10*3/uL (ref 1.4–7.0)
Neutrophils: 68 %
Platelets: 279 10*3/uL (ref 150–450)
RBC: 5.39 x10E6/uL (ref 4.14–5.80)
RDW: 15.1 % (ref 11.6–15.4)
WBC: 8.7 10*3/uL (ref 3.4–10.8)

## 2019-06-24 LAB — COMP. METABOLIC PANEL (12)
AST: 23 IU/L (ref 0–40)
Albumin/Globulin Ratio: 1.5 (ref 1.2–2.2)
Albumin: 4.5 g/dL (ref 4.0–5.0)
Alkaline Phosphatase: 63 IU/L (ref 39–117)
BUN/Creatinine Ratio: 15 (ref 9–20)
BUN: 18 mg/dL (ref 6–20)
Bilirubin Total: 0.3 mg/dL (ref 0.0–1.2)
Calcium: 9.6 mg/dL (ref 8.7–10.2)
Chloride: 98 mmol/L (ref 96–106)
Creatinine, Ser: 1.18 mg/dL (ref 0.76–1.27)
GFR calc Af Amer: 89 mL/min/{1.73_m2} (ref >59)
GFR calc non Af Amer: 77 mL/min/{1.73_m2} (ref >59)
Globulin, Total: 3.1 g/dL (ref 1.5–4.5)
Glucose: 159 mg/dL — ABNORMAL HIGH (ref 65–99)
Potassium: 3.8 mmol/L (ref 3.5–5.2)
Sodium: 139 mmol/L (ref 134–144)
Total Protein: 7.6 g/dL (ref 6.0–8.5)

## 2019-06-24 LAB — LIPID PANEL
Chol/HDL Ratio: 4.2 ratio (ref 0.0–5.0)
Cholesterol, Total: 174 mg/dL (ref 100–199)
HDL: 41 mg/dL (ref >39)
LDL Chol Calc (NIH): 111 mg/dL — ABNORMAL HIGH (ref 0–99)
Triglycerides: 122 mg/dL (ref 0–149)
VLDL Cholesterol Cal: 22 mg/dL (ref 5–40)

## 2019-06-24 LAB — TSH: TSH: 1.27 u[IU]/mL (ref 0.450–4.500)

## 2019-06-24 LAB — CYTOLOGY, URINE

## 2019-06-24 LAB — HIV ANTIBODY (ROUTINE TESTING W REFLEX): HIV Screen 4th Generation wRfx: NONREACTIVE

## 2019-06-24 LAB — RPR: RPR Ser Ql: NONREACTIVE

## 2019-06-24 MED ORDER — ACCU-CHEK AVIVA DEVI: 0 refills | Status: AC

## 2019-06-24 MED ORDER — METFORMIN HCL 500 MG PO TABS: 500.0000 mg | ORAL_TABLET | Freq: Every day | ORAL | 0 refills | Status: DC

## 2019-06-24 MED ORDER — ACCU-CHEK AVIVA PLUS VI STRP: ORAL_STRIP | 12 refills | Status: DC

## 2019-06-24 MED ORDER — ACCU-CHEK SOFTCLIX LANCETS MISC: 12 refills | Status: DC

## 2019-08-10 ENCOUNTER — Telehealth (INDEPENDENT_AMBULATORY_CARE_PROVIDER_SITE_OTHER): Payer: Self-pay | Admitting: Internal Medicine

## 2019-08-10 ENCOUNTER — Encounter: Payer: Self-pay | Admitting: Internal Medicine

## 2019-08-10 DIAGNOSIS — I10 Essential (primary) hypertension: Secondary | ICD-10-CM

## 2019-08-10 DIAGNOSIS — E119 Type 2 diabetes mellitus without complications: Secondary | ICD-10-CM

## 2019-08-10 MED ORDER — SPIRONOLACTONE 25 MG PO TABS: 25.0000 mg | ORAL_TABLET | Freq: Every day | ORAL | 0 refills | Status: DC

## 2019-08-10 NOTE — Progress Notes (Signed)
Virtual Visit via Telephone Note  I connected with DARAY POLGAR, on 08/10/2019 at 2:30 PM by telephone due to the COVID-19 pandemic and verified that I am speaking with the correct person using two identifiers.   Consent: I discussed the limitations, risks, security and privacy concerns of performing an evaluation and management service by telephone and the availability of in person appointments. I also discussed with the patient that there may be a patient responsible charge related to this service. The patient expressed understanding and agreed to proceed.   Location of Patient: Home   Location of Provider: Clinic    Persons participating in Telemedicine visit: Rock Sobol [facility] Dr. Juleen China    History of Present Illness: Patient has a visit to follow up on chronic medical conditions.   Diabetes mellitus, Type 2 Disease Monitoring             Blood Sugar Ranges: Does not monitor. States can't afford supplies.              Polyuria: No              Visual problems: no   Last A1C: 6.2 (June 23 2019)   Medications: Metformin 500 mg daily  Medication Compliance: yes  Medication Side Effects             Hypoglycemia: no   Chronic HTN Disease Monitoring:  Home BP Monitoring - 124/80 earlier this week, using friends cuff  Chest pain- no  Dyspnea- no Headache - no  Medications: Coreg 12.5 mg BID, Lisinopril 40 mg, Spironolactone 25 mg  Compliance- yes Lightheadedness- no  Edema- no       Past Medical History:  Diagnosis Date  . Chronic venous insufficiency    a. as a premature infant, required venous access for care with subsequent DVT and some sort of procedure -> eventually went on to have vein bypass around 2005.  [name] Hypertension   . Lymphedema   . Morbid obesity ([place])   . OSA on CPAP    Allergies  Allergen Reactions  . Lidocaine Other (See Comments)    "burns my skin"  . Peanut-Containing Drug Products Swelling    Tree nuts     Current Outpatient Medications on File Prior to Visit  Medication Sig Dispense Refill  . Accu-Chek Softclix Lancets lancets Use as instructed 100 each 12  . Blood Glucose Monitoring Suppl (ACCU-CHEK AVIVA) device Use as instructed 1 each 0  . carvedilol (COREG) 12.5 MG tablet Take 1 tablet (12.5 mg total) by mouth 2 (two) times daily. 180 tablet 0  . furosemide (LASIX) 80 MG tablet Take 1 tablet (80 mg total) by mouth 2 (two) times daily. 180 tablet 0  . glucose blood (ACCU-CHEK AVIVA PLUS) test strip Use as instructed 100 each 12  . lisinopril (ZESTRIL) 40 MG tablet Take 1 tablet (40 mg total) by mouth daily. 90 tablet 0  . metFORMIN (GLUCOPHAGE) 500 MG tablet Take 1 tablet (500 mg total) by mouth daily with breakfast. 90 tablet 0  . potassium chloride SA (KLOR-CON) 20 MEQ tablet Take 2 tablets (40 mEq total) by mouth 2 (two) times daily. 360 tablet 0  . spironolactone (ALDACTONE) 25 MG tablet Take 1 tablet (25 mg total) by mouth daily. 90 tablet 0   No current facility-administered medications on file prior to visit.    Observations/Objective: NAD. Speaking clearly.  Work of breathing normal.  Alert and oriented. Mood appropriate.   Assessment and Plan: 1.  Essential hypertension BP sounds well controlled. Patient asymptomatic. Continue current regimen. Requests refill for Spironolactone.  - spironolactone (ALDACTONE) 25 MG tablet; Take 1 tablet (25 mg total) by mouth daily.  Dispense: 90 tablet; Refill: 0  2. Type 2 diabetes mellitus without complication, without [place]-term current use of insulin (HCC) Recent A1c shows good glycemic control. Continue Metformin. Will plan to repeat A1c at next visit.    Follow Up Instructions: Annual exam 7/16    I discussed the assessment and treatment plan with the patient. The patient was provided an opportunity to ask questions and all were answered. The patient agreed with the plan and demonstrated an understanding of the instructions.    The patient was advised to call back or seek an in-person evaluation if the symptoms worsen or if the condition fails to improve as anticipated.     I provided 12 minutes total of non-face-to-face time during this encounter including median intraservice time, reviewing previous notes, investigations, ordering medications, medical decision making, coordinating care and patient verbalized understanding at the end of the visit.    Marcy Siren, D.O. Primary Care at [facility]  08/10/2019, 2:30 PM

## 2019-08-29 ENCOUNTER — Other Ambulatory Visit: Payer: Self-pay

## 2019-08-29 ENCOUNTER — Encounter (HOSPITAL_COMMUNITY): Payer: Self-pay

## 2019-08-29 ENCOUNTER — Emergency Department (HOSPITAL_COMMUNITY)
Admission: EM | Admit: 2019-08-29 | Discharge: 2019-08-29 | Disposition: A | Discharge status: Home / Self Care | Payer: Self-pay | Attending: Emergency Medicine | Admitting: Emergency Medicine

## 2019-08-29 DIAGNOSIS — E669 Obesity, unspecified: Secondary | ICD-10-CM | POA: Insufficient documentation

## 2019-08-29 DIAGNOSIS — Z6841 Body Mass Index (BMI) 40.0 and over, adult: Secondary | ICD-10-CM | POA: Insufficient documentation

## 2019-08-29 DIAGNOSIS — I10 Essential (primary) hypertension: Secondary | ICD-10-CM | POA: Insufficient documentation

## 2019-08-29 DIAGNOSIS — Z79899 Other long term (current) drug therapy: Secondary | ICD-10-CM | POA: Insufficient documentation

## 2019-08-29 DIAGNOSIS — M5441 Lumbago with sciatica, right side: Secondary | ICD-10-CM | POA: Insufficient documentation

## 2019-08-29 MED ORDER — METHOCARBAMOL 750 MG PO TABS: 750.0000 mg | ORAL_TABLET | Freq: Two times a day (BID) | ORAL | 0 refills | Status: DC | PRN

## 2019-08-29 MED ORDER — PREDNISONE 10 MG (21) PO TBPK: ORAL_TABLET | ORAL | 0 refills | Status: DC

## 2019-08-29 NOTE — Discharge Instructions (Signed)
  Expect your soreness to increase over the next 2-3 days. Take it easy, but do not lay around too much as this may make any stiffness worse.  Antiinflammatory medications: Take 600 mg of ibuprofen every 6 hours or 440 mg (over the counter dose) to 500 mg (prescription dose) of naproxen every 12 hours for the next 3 days. After this time, these medications may be used as needed for pain. Take these medications with food to avoid upset stomach. Choose only one of these medications, do not take them together. Acetaminophen (generic for Tylenol): Should you continue to have additional pain while taking the ibuprofen or naproxen, you may add in acetaminophen as needed. Your daily total maximum amount of acetaminophen from all sources should be limited to 4000mg /day for persons without liver problems, or 2000mg /day for those with liver problems. Prednisone: Take the prednisone, as prescribed, until finished. If you are a diabetic, please know prednisone can raise your blood sugar temporarily. Methocarbamol: Methocarbamol (generic for Robaxin) is a muscle relaxer and can help relieve stiff muscles or muscle spasms.  Do not drive or perform other dangerous activities while taking this medication as it can cause drowsiness as well as changes in reaction time and judgement. Ice: May apply ice to the area over the next 24 hours for 15 minutes at a time to reduce pain, inflammation, and swelling, if present. Exercises: Be sure to perform the attached exercises starting with three times a week and working up to performing them daily. This is an essential part of preventing [place] term problems.  Follow up: Follow up with a primary care provider for any future management of these complaints. Be sure to follow up within 7-10 days. Return: Return to the ED should symptoms worsen.  For prescription assistance, may try using prescription discount sites or apps, such as goodrx.com

## 2019-08-29 NOTE — ED Notes (Signed)
Patient given discharge instructions. Questions were answered. Patient verbalized understanding of discharge instructions and care at home.  

## 2019-08-29 NOTE — ED Provider Notes (Signed)
[facility] EMERGENCY DEPARTMENT Provider Note   CSN: [id number] Arrival date & time: 08/29/19  1136     History Chief Complaint  Patient presents with  . Back Pain    [name] is a 39 y.o. male.  HPI      [name] is a 39 y.o. male, with a history of morbid obesity, venous insufficiency, HTN, presenting to the ED with right lower back pain for the past 2 days. Pain is severe, described as a tightness as well as shooting pain extending intermittently into the buttock and back of the right leg. He states this occurred when he bent over to get something out of the oven. Denies falls/trauma, neurologic deficits, saddle anesthesias, changes in bowel or bladder function, abdominal pain, or any other complaints.     Past Medical History:  Diagnosis Date  . Chronic venous insufficiency    a. as a premature infant, required venous access for care with subsequent DVT and some sort of procedure -> eventually went on to have vein bypass around 2005.  [name] Hypertension   . Lymphedema   . Morbid obesity ([place])   . OSA on CPAP     Patient Active Problem List   Diagnosis Date Noted  . Prediabetes 06/24/2019  . Chronic congestive heart failure ([place])   . Generalized edema   . NSVT (nonsustained ventricular tachycardia) ([place]) 03/25/2016  . Hypokalemia 03/25/2016  . Acute respiratory failure ([place]) 03/21/2016  . SOB (shortness of breath) 03/20/2016  . Acute respiratory failure with hypoxia ([place]) 03/20/2016  . Hypertensive urgency 03/20/2016  . Abdominal pain 03/20/2016  . OSA (obstructive sleep apnea) 03/20/2016  . Type 2 diabetes mellitus ([place]) 12/29/2015  . Wound infection   . Hyperglycemia   . Cellulitis 12/26/2015  . Chronic acquired lymphedema 12/26/2015  . Shortness of breath   . Venous stasis ulcer ([place]) 01/09/2011  . Hypertension 01/09/2011  . Class 3 severe obesity due to excess calories with serious comorbidity and body mass index (BMI) of 60.0  to 69.9 in adult [facility]) 01/09/2011    Past Surgical History:  Procedure Laterality Date  . balloon stenting Right    right leg  . VEIN LIGATION AND STRIPPING         Family History  Problem Relation Age of Onset  . Diabetes Mother   . Cancer Father        colon  . Cancer Maternal Grandmother   . Cancer Maternal Grandfather     Social History   Tobacco Use  . Smoking status: Never Smoker  . Smokeless tobacco: Never Used  Substance Use Topics  . Alcohol use: Yes    Alcohol/week: 4.0 standard drinks    Types: 4 Standard drinks or equivalent per week    Comment: liquor - pt states once a week  . Drug use: No    Home Medications Prior to Admission medications   Medication Sig Start Date End Date Taking? Authorizing Provider  Accu-Chek Softclix Lancets lancets Use as instructed Patient not taking: Reported on 08/10/2019 06/24/19   Mayers, Cari S, PA-C  Blood Glucose Monitoring Suppl (ACCU-CHEK AVIVA) device Use as instructed Patient not taking: Reported on 08/10/2019 06/24/19 06/23/20  Mayers, Cari S, PA-C  carvedilol (COREG) 12.5 MG tablet Take 1 tablet (12.5 mg total) by mouth 2 (two) times daily. 06/23/19   Mayers, Cari S, PA-C  furosemide (LASIX) 80 MG tablet Take 1 tablet (80 mg total) by mouth 2 (two) times daily.  06/23/19   Mayers, Cari S, PA-C  glucose blood (ACCU-CHEK AVIVA PLUS) test strip Use as instructed Patient not taking: Reported on 08/10/2019 06/24/19   Mayers, Cari S, PA-C  lisinopril (ZESTRIL) 40 MG tablet Take 1 tablet (40 mg total) by mouth daily. 06/23/19   Mayers, Cari S, PA-C  metFORMIN (GLUCOPHAGE) 500 MG tablet Take 1 tablet (500 mg total) by mouth daily with breakfast. 06/24/19   Mayers, Cari S, PA-C  methocarbamol (ROBAXIN) 750 MG tablet Take 1 tablet (750 mg total) by mouth 2 (two) times daily as needed for muscle spasms (or muscle tightness). 08/29/19   [place] President C, PA-C  potassium chloride SA (KLOR-CON) 20 MEQ tablet Take 2 tablets (40 mEq total) by mouth 2  (two) times daily. 06/23/19   Mayers, Cari S, PA-C  predniSONE (STERAPRED UNI-PAK 21 TAB) 10 MG (21) TBPK tablet Take 6 tabs (60mg ) day 1, 5 tabs (50mg ) day 2, 4 tabs (40mg ) day 3, 3 tabs (30mg ) day 4, 2 tabs (20mg ) day 5, and 1 tab (10mg ) day 6. 08/29/19   Zanai Mallari C, PA-C  spironolactone (ALDACTONE) 25 MG tablet Take 1 tablet (25 mg total) by mouth daily. 08/10/19   , DO    Allergies    Lidocaine and Peanut-containing drug products  Review of Systems   Review of Systems  Gastrointestinal: Negative for abdominal pain.  Genitourinary: Negative for difficulty urinating.  Musculoskeletal: Positive for back pain.  Neurological: Negative for weakness and numbness.    Physical Exam Updated Vital Signs BP (!) 154/99 (BP Location: Left Arm)   Pulse 96   Temp 98.1 F (36.7 C) (Oral)   Resp 20   SpO2 100%   Physical Exam Vitals and nursing note reviewed.  Constitutional:      General: He is not in acute distress.    Appearance: He is well-developed. He is obese. He is not diaphoretic.  HENT:     Head: Normocephalic and atraumatic.  Eyes:     Conjunctiva/sclera: Conjunctivae normal.  Cardiovascular:     Rate and Rhythm: Normal rate and regular rhythm.     Pulses:          Dorsalis pedis pulses are 2+ on the right side and 2+ on the left side.  Pulmonary:     Effort: Pulmonary effort is normal.  Musculoskeletal:     Cervical back: Neck supple.       Back:     Comments: Tenderness to the right lower lumbar musculature into the right buttocks. Normal motor function intact in the lower extremities. No midline spinal tenderness.   Skin:    General: Skin is warm and dry.     Coloration: Skin is not pale.  Neurological:     Mental Status: He is alert.     Comments: Sensation grossly intact to light touch in the lower extremities bilaterally. No saddle anesthesias. Strength 5/5 in the bilateral lower extremities. No noted gait deficit. Coordination intact.    Psychiatric:        Behavior: Behavior normal.     ED Results / Procedures / Treatments   Labs (all labs ordered are listed, but only abnormal results are displayed) Labs Reviewed - No data to display  EKG None  Radiology No results found.  Procedures Procedures (including critical care time)  Medications Ordered in ED Medications - No data to display  ED Course  I have reviewed the triage vital signs and the nursing notes.  Pertinent labs &  imaging results that were available during my care of the patient were reviewed by me and considered in my medical decision making (see chart for details).    MDM Rules/Calculators/A&P                          Patient presents with lower back pain with symptoms suggestive of sciatica. No focal neurologic deficits or red flag symptoms. The patient was given instructions for home care as well as return precautions. Patient voices understanding of these instructions, accepts the plan, and is comfortable with discharge.     Final Clinical Impression(s) / ED Diagnoses Final diagnoses:  Acute right-sided low back pain with right-sided sciatica    Rx / DC Orders ED Discharge Orders         Ordered    predniSONE (STERAPRED UNI-PAK 21 TAB) 10 MG (21) TBPK tablet     Discontinue  Reprint     08/29/19 1344    methocarbamol (ROBAXIN) 750 MG tablet  2 times daily PRN     Discontinue  Reprint     08/29/19 1344           Anselm Pancoast, PA-C 08/29/19 2231    Little, Ambrose Finland, MD 09/01/19 [phone]

## 2019-08-29 NOTE — ED Triage Notes (Signed)
Pt presents with sciatica pain x2 days. Pt reports increase pain to Right hip area radiating to his Right leg, unable to get comfortable with standing or sitting. Pt states he bent over to get something out of the oven

## 2019-09-24 ENCOUNTER — Encounter: Payer: BLUE CROSS/BLUE SHIELD | Admitting: Internal Medicine

## 2019-09-28 ENCOUNTER — Encounter: Payer: Self-pay | Admitting: Internal Medicine

## 2019-09-28 ENCOUNTER — Ambulatory Visit (INDEPENDENT_AMBULATORY_CARE_PROVIDER_SITE_OTHER): Payer: Self-pay | Admitting: Internal Medicine

## 2019-09-28 ENCOUNTER — Other Ambulatory Visit: Payer: Self-pay

## 2019-09-28 VITALS — BP 92/62 | HR 83 | Temp 97.5°F | Resp 19 | Ht 73.0 in | Wt >= 6400 oz

## 2019-09-28 DIAGNOSIS — Z1159 Encounter for screening for other viral diseases: Secondary | ICD-10-CM

## 2019-09-28 DIAGNOSIS — M5441 Lumbago with sciatica, right side: Secondary | ICD-10-CM

## 2019-09-28 DIAGNOSIS — Z0000 Encounter for general adult medical examination without abnormal findings: Secondary | ICD-10-CM

## 2019-09-28 DIAGNOSIS — E119 Type 2 diabetes mellitus without complications: Secondary | ICD-10-CM

## 2019-09-28 DIAGNOSIS — I10 Essential (primary) hypertension: Secondary | ICD-10-CM

## 2019-09-28 MED ORDER — BACLOFEN 10 MG PO TABS: 10.0000 mg | ORAL_TABLET | Freq: Three times a day (TID) | ORAL | 0 refills | Status: DC

## 2019-09-28 MED ORDER — MELOXICAM 15 MG PO TABS: 15.0000 mg | ORAL_TABLET | Freq: Every day | ORAL | 0 refills | Status: DC

## 2019-09-28 NOTE — Progress Notes (Signed)
Subjective:    [name] - 39 y.o. male MRN [id number]  Date of birth: May 14, 1980  HPI  [name] is here for annual exam.  Reports that he was bending to get something out of the oven a couple weeks ago and immediately his back muscles cramped up. At first he was in very bad pain and limited in his mobility. Seems to be improving now but still having some right sided low back pain with pain radiating down leg occasionally. No changes in bowel or bladder function. No fevers or saddle anesthesia.   Monitors BP at home and typically in the 120s/80s. Not feeling lightheaded or dizzy today.      Health Maintenance:  Health Maintenance Due  Topic Date Due  . FOOT EXAM  Never done  . COVID-19 Vaccine (1) Never done    -  reports that he has never smoked. He has never used smokeless tobacco. - Review of Systems: Per HPI. - Past Medical History: Patient Active Problem List   Diagnosis Date Noted  . Prediabetes 06/24/2019  . Chronic congestive heart failure (HCC)   . Generalized edema   . NSVT (nonsustained ventricular tachycardia) (HCC) 03/25/2016  . Hypokalemia 03/25/2016  . Acute respiratory failure (HCC) 03/21/2016  . SOB (shortness of breath) 03/20/2016  . Acute respiratory failure with hypoxia (HCC) 03/20/2016  . Hypertensive urgency 03/20/2016  . Abdominal pain 03/20/2016  . OSA (obstructive sleep apnea) 03/20/2016  . Type 2 diabetes mellitus (HCC) 12/29/2015  . Wound infection   . Hyperglycemia   . Cellulitis 12/26/2015  . Chronic acquired lymphedema 12/26/2015  . Shortness of breath   . Venous stasis ulcer (HCC) 01/09/2011  . Hypertension 01/09/2011  . Class 3 severe obesity due to excess calories with serious comorbidity and body mass index (BMI) of 60.0 to 69.9 in adult [facility]) 01/09/2011   - Medications: reviewed and updated   Objective:   Physical Exam BP 92/62   Pulse 83   Temp (!) 97.5 F (36.4 C) (Temporal)   Resp 19   Ht 6\' 1"  (1.854 m)   Wt  (!) 522 lb (236.8 kg)   SpO2 95%   BMI 68.87 kg/m  Physical Exam Constitutional:      Appearance: He is not diaphoretic.  HENT:     Head: Normocephalic and atraumatic.  Eyes:     Conjunctiva/sclera: Conjunctivae normal.     Pupils: Pupils are equal, round, and reactive to light.  Neck:     Thyroid: No thyromegaly.  Cardiovascular:     Rate and Rhythm: Normal rate and regular rhythm.     Heart sounds: Normal heart sounds. No murmur heard.   Pulmonary:     Effort: Pulmonary effort is normal. No respiratory distress.     Breath sounds: Normal breath sounds. No wheezing.  Abdominal:     General: Bowel sounds are normal. There is no distension.     Palpations: Abdomen is soft.     Tenderness: There is no abdominal tenderness. There is no guarding or rebound.  Musculoskeletal:        General: No deformity. Normal range of motion.     Cervical back: Normal range of motion and neck supple.  Lymphadenopathy:     Cervical: No cervical adenopathy.  Skin:    General: Skin is warm and dry.     Findings: No rash.  Neurological:     Mental Status: He is alert and oriented to person, place, and time.  Sensory: No sensory deficit.     Motor: No weakness.     Gait: Gait is intact. Gait normal.  Psychiatric:        Mood and Affect: Mood and affect normal.        Judgment: Judgment normal.            Assessment & Plan:  1. Annual physical exam Counseled on 150 minutes of exercise per week, healthy eating (including decreased daily intake of saturated fats, cholesterol, added sugars, sodium), STI prevention, routine healthcare maintenance. - Basic Metabolic Panel  2. Type 2 diabetes mellitus without complication, without Nin-term current use of insulin (HCC) Prior A1c trend shows good glycemic control. Will repeat today. Continue Metformin.  - Hemoglobin A1c  3. Need for hepatitis C screening test - Hepatitis C Antibody  4. Essential hypertension BP is actually on lower  side at office visit but patient asymptomatic. Seems to have normotensive pressures at home. Continue current regimen.  - Basic Metabolic Panel  5. Acute right-sided low back pain with right-sided sciatica Suspect related to muscle spasm/strain. No red flags on exam or history. Unable to perform straight leg test due to body habitus. Will treat with muscle relaxer and anti-inflammatory. Discussed precautions to seek emergency care and return if not improvement in 4-6 weeks.  - baclofen (LIORESAL) 10 MG tablet; Take 1 tablet (10 mg total) by mouth 3 (three) times daily.  Dispense: 30 each; Refill: 0 - meloxicam (MOBIC) 15 MG tablet; Take 1 tablet (15 mg total) by mouth daily.  Dispense: 30 tablet; Refill: 0     Marcy Siren, D.O. 09/29/2019, 4:22 PM Primary Care at [facility]

## 2019-09-29 LAB — BASIC METABOLIC PANEL
BUN/Creatinine Ratio: 14 (ref 9–20)
BUN: 14 mg/dL (ref 6–20)
CO2: 26 mmol/L (ref 20–29)
Calcium: 9.1 mg/dL (ref 8.7–10.2)
Chloride: 103 mmol/L (ref 96–106)
Creatinine, Ser: 0.99 mg/dL (ref 0.76–1.27)
GFR calc Af Amer: 110 mL/min/{1.73_m2} (ref >59)
GFR calc non Af Amer: 96 mL/min/{1.73_m2} (ref >59)
Glucose: 98 mg/dL (ref 65–99)
Potassium: 4 mmol/L (ref 3.5–5.2)
Sodium: 142 mmol/L (ref 134–144)

## 2019-09-29 LAB — HEMOGLOBIN A1C
Est. average glucose Bld gHb Est-mCnc: 151 mg/dL
Hgb A1c MFr Bld: 6.9 % — ABNORMAL HIGH (ref 4.8–5.6)

## 2019-09-29 LAB — HEPATITIS C ANTIBODY: Hep C Virus Ab: <0.1 s/co ratio (ref 0.0–0.9)

## 2019-12-24 ENCOUNTER — Other Ambulatory Visit: Payer: Self-pay

## 2019-12-24 DIAGNOSIS — I10 Essential (primary) hypertension: Secondary | ICD-10-CM

## 2019-12-24 DIAGNOSIS — R7303 Prediabetes: Secondary | ICD-10-CM

## 2019-12-24 MED ORDER — LISINOPRIL 40 MG PO TABS: 40.0000 mg | ORAL_TABLET | Freq: Every day | ORAL | 0 refills | Status: DC

## 2019-12-24 MED ORDER — SPIRONOLACTONE 25 MG PO TABS: 25.0000 mg | ORAL_TABLET | Freq: Every day | ORAL | 0 refills | Status: DC

## 2019-12-24 MED ORDER — METFORMIN HCL 500 MG PO TABS: 500.0000 mg | ORAL_TABLET | Freq: Every day | ORAL | 0 refills | Status: DC

## 2019-12-24 MED ORDER — CARVEDILOL 12.5 MG PO TABS: 12.5000 mg | ORAL_TABLET | Freq: Two times a day (BID) | ORAL | 0 refills | Status: DC

## 2019-12-24 MED ORDER — POTASSIUM CHLORIDE CRYS ER 20 MEQ PO TBCR: 40.0000 meq | EXTENDED_RELEASE_TABLET | Freq: Two times a day (BID) | ORAL | 0 refills | Status: DC

## 2019-12-24 MED ORDER — FUROSEMIDE 80 MG PO TABS: 80.0000 mg | ORAL_TABLET | Freq: Two times a day (BID) | ORAL | 0 refills | Status: DC

## 2020-01-05 ENCOUNTER — Telehealth: Payer: Self-pay

## 2020-01-05 DIAGNOSIS — R7303 Prediabetes: Secondary | ICD-10-CM

## 2020-01-05 DIAGNOSIS — I10 Essential (primary) hypertension: Secondary | ICD-10-CM

## 2020-01-05 NOTE — Telephone Encounter (Signed)
1) Medication(s) Requested (by name):   carvedilol (COREG) 12.5 MG tablet   metFORMIN (GLUCOPHAGE) 500 MG tablet   furosemide (LASIX) 80 MG tablet   lisinopril (ZESTRIL) 40 MG tablet   potassium chloride SA (KLOR-CON) 20 MEQ tablet   spironolactone (ALDACTONE) 25 MG tablet   2) Pharmacy of Choice: [facility] [phone] Jacky Kindle [address]  [address] [place], [place] [address]  [phone]  3) Special Requests:   Approved medications will be sent to the pharmacy, we will reach out if there is an issue.  Requests made after 3pm may not be addressed until the following business day!  If a patient is unsure of the name of the medication(s) please note and ask patient to call back when they are able to provide all info, do not send to responsible party until all information is available!

## 2020-01-06 MED ORDER — CARVEDILOL 12.5 MG PO TABS: 12.5000 mg | ORAL_TABLET | Freq: Two times a day (BID) | ORAL | 0 refills | Status: DC

## 2020-01-06 MED ORDER — POTASSIUM CHLORIDE CRYS ER 20 MEQ PO TBCR: 40.0000 meq | EXTENDED_RELEASE_TABLET | Freq: Two times a day (BID) | ORAL | 0 refills | Status: DC

## 2020-01-06 MED ORDER — METFORMIN HCL 500 MG PO TABS: 500.0000 mg | ORAL_TABLET | Freq: Every day | ORAL | 0 refills | Status: DC

## 2020-01-06 MED ORDER — FUROSEMIDE 80 MG PO TABS: 80.0000 mg | ORAL_TABLET | Freq: Two times a day (BID) | ORAL | 0 refills | Status: DC

## 2020-01-06 MED ORDER — SPIRONOLACTONE 25 MG PO TABS: 25.0000 mg | ORAL_TABLET | Freq: Every day | ORAL | 0 refills | Status: DC

## 2020-01-06 MED ORDER — LISINOPRIL 40 MG PO TABS: 40.0000 mg | ORAL_TABLET | Freq: Every day | ORAL | 0 refills | Status: DC

## 2020-01-06 NOTE — Addendum Note (Signed)
Addended by: Heidi Dach on: 01/06/2020 09:37 AM   Modules accepted: Orders

## 2020-01-06 NOTE — Telephone Encounter (Signed)
Refills sent. Please schedule patient a DM/BP f/up(virtual with labs) prior to patient needing refills.

## 2020-01-17 ENCOUNTER — Ambulatory Visit
Admission: EM | Admit: 2020-01-17 | Discharge: 2020-01-17 | Disposition: A | Discharge status: Home / Self Care | Payer: Self-pay | Attending: Emergency Medicine | Admitting: Emergency Medicine

## 2020-01-17 ENCOUNTER — Encounter: Payer: Self-pay | Admitting: Emergency Medicine

## 2020-01-17 ENCOUNTER — Telehealth: Payer: Self-pay

## 2020-01-17 ENCOUNTER — Ambulatory Visit: Payer: Self-pay | Admitting: Physician Assistant

## 2020-01-17 ENCOUNTER — Other Ambulatory Visit: Payer: Self-pay

## 2020-01-17 DIAGNOSIS — L03115 Cellulitis of right lower limb: Secondary | ICD-10-CM

## 2020-01-17 DIAGNOSIS — S81801A Unspecified open wound, right lower leg, initial encounter: Secondary | ICD-10-CM

## 2020-01-17 MED ORDER — KETOROLAC TROMETHAMINE 30 MG/ML IJ SOLN
30.0000 mg | Freq: Once | INTRAMUSCULAR | Status: AC
  Administered 2020-01-17: 30 mg via INTRAMUSCULAR

## 2020-01-17 MED ORDER — DOXYCYCLINE HYCLATE 100 MG PO CAPS: 100.0000 mg | ORAL_CAPSULE | Freq: Two times a day (BID) | ORAL | 0 refills | Status: DC

## 2020-01-17 NOTE — ED Provider Notes (Signed)
[name]    CSN: [id number] Arrival date & time: 01/17/20  1009      History   Chief Complaint Chief Complaint  Patient presents with  . Leg Pain    HPI LIVIO LEDWITH is a 39 y.o. male  With history as below presenting for suspected cellulitis of right lower leg.  Patient provides history: Endorsing right lower leg pain for the last week.  Noted rash around that time.  Endorsing subjective fevers at home.  Has had cellulitis in affected leg which felt similar in 2017.  Does not actively check sugars at home, though denies symptoms of poor control.  States his girlfriend is a Engineer, civil (consulting) who has been dressing wound.  Denies trauma, bug bite, discharge.   Past Medical History:  Diagnosis Date  . Chronic venous insufficiency    a. as a premature infant, required venous access for [name] with subsequent DVT and some sort of procedure -> eventually went on to have vein bypass around 2005.  [name] Hypertension   . Lymphedema   . Morbid obesity (HCC)   . OSA on CPAP     Patient Active Problem List   Diagnosis Date Noted  . Prediabetes 06/24/2019  . Chronic congestive heart failure (HCC)   . Generalized edema   . NSVT (nonsustained ventricular tachycardia) (HCC) 03/25/2016  . Hypokalemia 03/25/2016  . Acute respiratory failure (HCC) 03/21/2016  . SOB (shortness of breath) 03/20/2016  . Acute respiratory failure with hypoxia (HCC) 03/20/2016  . Hypertensive urgency 03/20/2016  . Abdominal pain 03/20/2016  . OSA (obstructive sleep apnea) 03/20/2016  . Type 2 diabetes mellitus (HCC) 12/29/2015  . Wound infection   . Hyperglycemia   . Cellulitis 12/26/2015  . Chronic acquired lymphedema 12/26/2015  . Shortness of breath   . Venous stasis ulcer (HCC) 01/09/2011  . Hypertension 01/09/2011  . Class 3 severe obesity due to excess calories with serious comorbidity and body mass index (BMI) of 60.0 to 69.9 in adult [facility]) 01/09/2011    Past Surgical History:  Procedure  Laterality Date  . balloon stenting Right    right leg  . VEIN LIGATION AND STRIPPING         Home Medications    Prior to Admission medications   Medication Sig Start Date End Date Taking? Authorizing Provider  carvedilol (COREG) 12.5 MG tablet Take 1 tablet (12.5 mg total) by mouth 2 (two) times daily. 01/06/20  Yes Arvilla Market, DO  furosemide (LASIX) 80 MG tablet Take 1 tablet (80 mg total) by mouth 2 (two) times daily. 01/06/20  Yes Arvilla Market, DO  lisinopril (ZESTRIL) 40 MG tablet Take 1 tablet (40 mg total) by mouth daily. 01/06/20  Yes Arvilla Market, DO  metFORMIN (GLUCOPHAGE) 500 MG tablet Take 1 tablet (500 mg total) by mouth daily with breakfast. 01/06/20  Yes Arvilla Market, DO  potassium chloride SA (KLOR-CON) 20 MEQ tablet Take 2 tablets (40 mEq total) by mouth 2 (two) times daily. 01/06/20  Yes Arvilla Market, DO  spironolactone (ALDACTONE) 25 MG tablet Take 1 tablet (25 mg total) by mouth daily. 01/06/20  Yes Arvilla Market, DO  Accu-Chek Softclix Lancets lancets Use as instructed 06/24/19   Mayers, Cari S, PA-C  baclofen (LIORESAL) 10 MG tablet Take 1 tablet (10 mg total) by mouth 3 (three) times daily. 09/28/19   Arvilla Market, DO  Blood Glucose Monitoring Suppl (ACCU-CHEK AVIVA) device Use as instructed 06/24/19 06/23/20  Mayers, Rulon Eisenmenger  S, PA-C  doxycycline (VIBRAMYCIN) 100 MG capsule Take 1 capsule (100 mg total) by mouth 2 (two) times daily. 01/17/20   Hall-Potvin, [place], PA-C  glucose blood (ACCU-CHEK AVIVA PLUS) test strip Use as instructed 06/24/19   Mayers, Cari S, PA-C  meloxicam (MOBIC) 15 MG tablet Take 1 tablet (15 mg total) by mouth daily. 09/28/19   Arvilla Market, DO    Family History Family History  Problem Relation Age of Onset  . Diabetes Mother   . Cancer Father        colon  . Cancer Maternal Grandmother   . Cancer Maternal Grandfather     Social  History Social History   Tobacco Use  . Smoking status: Never Smoker  . Smokeless tobacco: Never Used  Substance Use Topics  . Alcohol use: Yes    Alcohol/week: 4.0 standard drinks    Types: 4 Standard drinks or equivalent per week    Comment: liquor - pt states once a week  . Drug use: No     Allergies   Lidocaine and Peanut-containing drug products   Review of Systems Review of Systems  Constitutional: Positive for fever. Negative for fatigue.       Subjective  Respiratory: Negative for cough and shortness of breath.   Cardiovascular: Negative for chest pain and palpitations.  Musculoskeletal:       Positive for right leg pain, swelling  Skin: Positive for wound.  Neurological: Negative for weakness and numbness.     Physical Exam Triage Vital Signs ED Triage Vitals  Enc Vitals Group     BP 01/17/20 1022 (!) 152/97     Pulse Rate 01/17/20 1022 80     Resp 01/17/20 1022 20     Temp 01/17/20 1022 98.3 F (36.8 C)     Temp Source 01/17/20 1022 Oral     SpO2 01/17/20 1022 97 %     Weight --      Height --      Head Circumference --      Peak Flow --      Pain Score 01/17/20 1019 8     Pain Loc --      Pain Edu? --      Excl. in GC? --    No data found.  Updated Vital Signs BP (!) 152/97 (BP Location: Right Arm)   Pulse 80   Temp 98.3 F (36.8 C) (Oral)   Resp 20   SpO2 97%   Visual Acuity Right Eye Distance:   Left Eye Distance:   Bilateral Distance:    Right Eye Near:   Left Eye Near:    Bilateral Near:     Physical Exam Constitutional:      General: He is not in acute distress.    Appearance: He is obese.  HENT:     Head: Normocephalic and atraumatic.  Eyes:     General: No scleral icterus.    Pupils: Pupils are equal, round, and reactive to light.  Cardiovascular:     Rate and Rhythm: Normal rate.  Pulmonary:     Effort: Pulmonary effort is normal. No respiratory distress.     Breath sounds: No wheezing.  Musculoskeletal:         General: Swelling and tenderness present. Normal range of motion.     Right lower leg: Edema present.     Left lower leg: Edema present.     Comments: NVI  Skin:    Coloration: Skin is not  jaundiced or pale.     Findings: Erythema present.     Comments: RLL w/ TPP, no crepitus.  2 open wounds, ~1 cm in diameter noted (anterior, posterior)  Neurological:     Mental Status: He is alert and oriented to person, place, and time.      UC Treatments / Results  Labs (all labs ordered are listed, but only abnormal results are displayed) Labs Reviewed - No data to display  EKG   Radiology No results found.  Procedures Procedures (including critical [name] time)  Medications Ordered in UC Medications  ketorolac (TORADOL) 30 MG/ML injection 30 mg (30 mg Intramuscular Given 01/17/20 1052)    Initial Impression / Assessment and Plan / UC Course  I have reviewed the triage vital signs and the nursing notes.  Pertinent labs & imaging results that were available during my [name] of the patient were reviewed by me and considered in my medical decision making (see chart for details).     Afebrile, nontoxic in office today.  Given history, comorbidities, exam, will start doxy for suspected cellulitis, which he has tolerated well in past.  Established pt w/ wound clinic; will call to schedule follow up.  Wound cleaned and dressed today.  Return precautions discussed, pt verbalized understanding and is agreeable to plan. Final Clinical Impressions(s) / UC Diagnoses   Final diagnoses:  Open wound of right lower leg, initial encounter  Cellulitis of right lower leg     Discharge Instructions     Important to continue diabetic medications and check sugars! Call wound clinic to schedule appointment for follow up. Go to ER for worsening infection, fever.    ED Prescriptions    Medication Sig Dispense Auth. Provider   doxycycline (VIBRAMYCIN) 100 MG capsule Take 1 capsule (100 mg total) by  mouth 2 (two) times daily. 20 capsule Hall-Potvin, [place], PA-C     PDMP not reviewed this encounter.   Hall-Potvin, [place], [place] 01/17/20 1109

## 2020-01-17 NOTE — Discharge Instructions (Addendum)
Important to continue diabetic medications and check sugars! Call wound clinic to schedule appointment for follow up. Go to ER for worsening infection, fever.

## 2020-01-17 NOTE — Telephone Encounter (Signed)
MA spoke with patient and informed him of additional wound care appointment not being needed today. Patient was seen by UC this morning and wound was address and cleaned at this appointment. Patient is aware of PCP receiving a message for a referral request to wound care to have the wound followed up and dressed by the specialist. Patient understood and had no concerns.

## 2020-01-17 NOTE — ED Triage Notes (Signed)
Patient c/o RT leg pain x 1 week.   Patient states "leg is red and hot to the touch".   Patient endorses chills and body aches.   Patient states temp was 99.7 F at home.   Patient has history of cellulitis (2017).

## 2020-01-17 NOTE — Telephone Encounter (Signed)
Pt came in asking for wound care referral to be placed had been seen at [facility] next door

## 2020-03-26 ENCOUNTER — Other Ambulatory Visit: Payer: Self-pay | Admitting: Internal Medicine

## 2020-03-26 DIAGNOSIS — R7303 Prediabetes: Secondary | ICD-10-CM

## 2020-03-26 DIAGNOSIS — I10 Essential (primary) hypertension: Secondary | ICD-10-CM

## 2020-05-10 ENCOUNTER — Other Ambulatory Visit: Payer: Self-pay | Admitting: Internal Medicine

## 2020-05-10 DIAGNOSIS — I10 Essential (primary) hypertension: Secondary | ICD-10-CM

## 2020-06-05 ENCOUNTER — Other Ambulatory Visit: Payer: Self-pay | Admitting: Internal Medicine

## 2020-06-05 DIAGNOSIS — I10 Essential (primary) hypertension: Secondary | ICD-10-CM

## 2020-06-09 ENCOUNTER — Other Ambulatory Visit: Payer: Self-pay | Admitting: Internal Medicine

## 2020-06-09 DIAGNOSIS — I10 Essential (primary) hypertension: Secondary | ICD-10-CM

## 2020-06-26 ENCOUNTER — Other Ambulatory Visit: Payer: Self-pay | Admitting: Internal Medicine

## 2020-06-26 DIAGNOSIS — I10 Essential (primary) hypertension: Secondary | ICD-10-CM

## 2020-07-06 ENCOUNTER — Other Ambulatory Visit: Payer: Self-pay | Admitting: Internal Medicine

## 2020-07-06 DIAGNOSIS — I10 Essential (primary) hypertension: Secondary | ICD-10-CM

## 2020-07-28 ENCOUNTER — Other Ambulatory Visit: Payer: Self-pay | Admitting: Internal Medicine

## 2020-07-28 DIAGNOSIS — I10 Essential (primary) hypertension: Secondary | ICD-10-CM

## 2020-08-17 ENCOUNTER — Other Ambulatory Visit: Payer: Self-pay | Admitting: Internal Medicine

## 2020-08-17 DIAGNOSIS — I10 Essential (primary) hypertension: Secondary | ICD-10-CM

## 2020-09-07 ENCOUNTER — Ambulatory Visit: Payer: Self-pay | Admitting: Physician Assistant

## 2020-09-07 NOTE — Progress Notes (Signed)
Patient ID: JAECION DEMPSTER, male    DOB: 02-Mar-1981  MRN: [id number]  CC: Hypertension and Diabetes Follow-Up   Subjective: [name] is a 40 y.o. male who presents for hypertension and diabetes follow-up.   His concerns today include:   HYPERTENSION FOLLOW-UP: 09/28/2019 per DO note: BP is actually on lower side at office visit but patient asymptomatic. Seems to have normotensive pressures at home. Continue current regimen.   09/08/2020: Taking Carvedilol, Lisinopril, and Furosemide. Ran out of Aldactone for several weeks. Reports previously managed by Cardiology for blood pressure. Reports he would like to return but previous provider moved offices.  Currently taking: see medication list Med Adherence: [x]  Yes    []  No Medication side effects: []  Yes    [x]  No Adherence with salt restriction (low-salt diet): [x]  Yes    []  No Exercise: Yes [x]  No []  Home Monitoring?: [x]  Yes, 120's/80's-90's Smoking []  Yes [x]  No SOB? []  Yes    [x]  No Chest Pain?: []  Yes    [x]  No  2. DIABETES TYPE 2 FOLLOW-UP: 09/28/2019 per DO note: Prior A1c trend shows good glycemic control. Will repeat today. Continue Metformin.   09/08/2020: Doing well on current regimen. No side effects. No issues/concerns. Med Adherence:  [x]  Yes    []  No Medication side effects:  []  Yes    [x]  No  Patient Active Problem List   Diagnosis Date Noted   Prediabetes 06/24/2019   Chronic congestive heart failure (HCC)    Generalized edema    NSVT (nonsustained ventricular tachycardia) (HCC) 03/25/2016   Hypokalemia 03/25/2016   Acute respiratory failure (HCC) 03/21/2016   SOB (shortness of breath) 03/20/2016   Acute respiratory failure with hypoxia (HCC) 03/20/2016   Hypertensive urgency 03/20/2016   Abdominal pain 03/20/2016   OSA (obstructive sleep apnea) 03/20/2016   Type 2 diabetes mellitus (HCC) 12/29/2015   Wound infection    Hyperglycemia    Cellulitis 12/26/2015   Chronic acquired lymphedema 12/26/2015    Shortness of breath    Venous stasis ulcer (HCC) 01/09/2011   Hypertension 01/09/2011   Class 3 severe obesity due to excess calories with serious comorbidity and body mass index (BMI) of 60.0 to 69.9 in adult [facility]) 01/09/2011     Current Outpatient Medications on File Prior to Visit  Medication Sig Dispense Refill   Accu-Chek Softclix Lancets lancets Use as instructed 100 each 12   baclofen (LIORESAL) 10 MG tablet Take 1 tablet (10 mg total) by mouth 3 (three) times daily. 30 each 0   doxycycline (VIBRAMYCIN) 100 MG capsule Take 1 capsule (100 mg total) by mouth 2 (two) times daily. 20 capsule 0   glucose blood (ACCU-CHEK AVIVA PLUS) test strip Use as instructed 100 each 12   meloxicam (MOBIC) 15 MG tablet Take 1 tablet (15 mg total) by mouth daily. 30 tablet 0   potassium chloride SA (KLOR-CON) 20 MEQ tablet Take 2 tablets (40 mEq total) by mouth 2 (two) times daily. 360 tablet 0   No current facility-administered medications on file prior to visit.    Allergies  Allergen Reactions   Lidocaine Other (See Comments)    "burns my skin"   Peanut-Containing Drug Products Swelling    Tree nuts    Social History   Socioeconomic History   Marital status: Single    Spouse name: Not on file   Number of children: Not on file   Years of education: Not on file   Highest education level:  Not on file  Occupational History   Not on file  Tobacco Use   Smoking status: Never   Smokeless tobacco: Never  Substance and Sexual Activity   Alcohol use: Yes    Alcohol/week: 4.0 standard drinks    Types: 4 Standard drinks or equivalent per week    Comment: liquor - pt states once a week   Drug use: No   Sexual activity: Not on file  Other Topics Concern   Not on file  Social History Narrative   Not on file   Social Determinants of Health   Financial Resource Strain: Not on file  Food Insecurity: Not on file  Transportation Needs: Not on file  Physical Activity: Not on file  Stress:  Not on file  Social Connections: Not on file  Intimate Partner Violence: Not on file    Family History  Problem Relation Age of Onset   Diabetes Mother    Cancer Father        colon   Cancer Maternal Grandmother    Cancer Maternal Grandfather     Past Surgical History:  Procedure Laterality Date   balloon stenting Right    right leg   VEIN LIGATION AND STRIPPING      ROS: Review of Systems Negative except as stated above  PHYSICAL EXAM: BP (!) 155/98 (BP Location: Right Arm, Patient Position: Sitting, Cuff Size: Large)   Pulse 90   Temp 98.1 F (36.7 C)   Resp 18   Ht 6' 0.99" (1.854 m)   Wt (!) 525 lb (238.1 kg)   SpO2 93%   BMI 69.28 kg/m   Physical Exam HENT:     Head: Normocephalic and atraumatic.  Eyes:     Extraocular Movements: Extraocular movements intact.     Conjunctiva/sclera: Conjunctivae normal.     Pupils: Pupils are equal, round, and reactive to light.  Cardiovascular:     Rate and Rhythm: Normal rate and regular rhythm.     Pulses: Normal pulses.     Heart sounds: Normal heart sounds.  Pulmonary:     Effort: Pulmonary effort is normal.     Breath sounds: Normal breath sounds.  Musculoskeletal:     Cervical back: Normal range of motion and neck supple.  Neurological:     General: No focal deficit present.     Mental Status: He is alert and oriented to person, place, and time.  Psychiatric:        Mood and Affect: Mood normal.        Behavior: Behavior normal.    ASSESSMENT AND PLAN: 1. Essential hypertension: - Blood pressure not at goal during today's visit. Patient asymptomatic without chest pressure, chest pain, palpitations, shortness of breath, and worst headache of life. - Patient endorses home blood pressures are 120's/80's-90's.  - Patient was previously managed by Cardiology for blood pressure and would like to return.  - Counseled on blood pressure goal of less than 130/80, low-sodium, DASH diet, medication compliance, 150  minutes of moderate intensity exercise per week as tolerated. Discussed medication compliance, adverse effects. - BMP to evaluate kidney function and electrolyte balance. - [facility] for further evaluation and management.  - Follow-up with primary provider as scheduled.  - Basic Metabolic Panel - Ambulatory referral to [facility] - [facility] [address] - carvedilol (COREG) 12.5 MG tablet; Take 1 tablet (12.5 mg total) by mouth 2 (two) times daily.  Dispense: 60 tablet; Refill: 2 - furosemide (LASIX)  80 MG tablet; Take 1 tablet (80 mg total) by mouth 2 (two) times daily.  Dispense: 60 tablet; Refill: 2 - lisinopril (ZESTRIL) 40 MG tablet; Take 1 tablet (40 mg total) by mouth daily.  Dispense: 90 tablet; Refill: 0 - spironolactone (ALDACTONE) 25 MG tablet; Take 1 tablet (25 mg total) by mouth daily.  Dispense: 90 tab  2. Type 2 diabetes mellitus without complication, without [place]-term current use of insulin (HCC): - Hemoglobin A1c today at goal at 6.5%, goal < 7%. This is improved from previous hemoglobin A1c of 6.9% on 09/28/2019. Next hemoglobin A1c due October 2022.  - Continue Metformin as prescribed.  - Discussed the importance of healthy eating habits, low-carbohydrate diet, low-sugar diet, regular aerobic exercise (at least 150 minutes a week as tolerated) and medication compliance to achieve or maintain control of diabetes. - Follow-up with primary provider in 3 months or sooner if needed. - POCT glycosylated hemoglobin (Hb A1C) - metFORMIN (GLUCOPHAGE) 500 MG tablet; Take 1 tablet (500 mg total) by mouth daily with breakfast.  Dispense: 90 tablet; Refill: 0   Patient was given the opportunity to ask questions.  Patient verbalized understanding of the plan and was able to repeat key elements of the plan. Patient was given clear instructions to go to Emergency Department or return to medical center if symptoms don't improve, worsen, or new  problems develop.The patient verbalized understanding.   Orders Placed This Encounter  Procedures   Basic Metabolic Panel   Ambulatory referral to [facility] [address]   POCT glycosylated hemoglobin (Hb A1C)     Requested Prescriptions   Signed Prescriptions Disp Refills   carvedilol (COREG) 12.5 MG tablet 60 tablet 2    Sig: Take 1 tablet (12.5 mg total) by mouth 2 (two) times daily.   furosemide (LASIX) 80 MG tablet 60 tablet 2    Sig: Take 1 tablet (80 mg total) by mouth 2 (two) times daily.   lisinopril (ZESTRIL) 40 MG tablet 90 tablet 0    Sig: Take 1 tablet (40 mg total) by mouth daily.   spironolactone (ALDACTONE) 25 MG tablet 90 tablet 0    Sig: Take 1 tablet (25 mg total) by mouth daily.   metFORMIN (GLUCOPHAGE) 500 MG tablet 90 tablet 0    Sig: Take 1 tablet (500 mg total) by mouth daily with breakfast.    Follow-up with primary provider as scheduled.  Rema Fendt, NP

## 2020-09-08 ENCOUNTER — Other Ambulatory Visit: Payer: Self-pay

## 2020-09-08 ENCOUNTER — Ambulatory Visit (INDEPENDENT_AMBULATORY_CARE_PROVIDER_SITE_OTHER): Payer: Self-pay | Admitting: Family

## 2020-09-08 ENCOUNTER — Encounter: Payer: Self-pay | Admitting: Family

## 2020-09-08 VITALS — BP 155/98 | HR 90 | Temp 98.1°F | Resp 18 | Ht 72.99 in | Wt >= 6400 oz

## 2020-09-08 DIAGNOSIS — I10 Essential (primary) hypertension: Secondary | ICD-10-CM

## 2020-09-08 DIAGNOSIS — E119 Type 2 diabetes mellitus without complications: Secondary | ICD-10-CM

## 2020-09-08 LAB — POCT GLYCOSYLATED HEMOGLOBIN (HGB A1C): Hemoglobin A1C: 6.5 % — AB (ref 4.0–5.6)

## 2020-09-08 NOTE — Progress Notes (Signed)
othePt presents for HTN and diabetes f/u. Pt needs refill on medications

## 2020-09-09 LAB — BASIC METABOLIC PANEL
BUN/Creatinine Ratio: 13 (ref 9–20)
BUN: 14 mg/dL (ref 6–24)
CO2: 26 mmol/L (ref 20–29)
Calcium: 9.3 mg/dL (ref 8.7–10.2)
Chloride: 98 mmol/L (ref 96–106)
Creatinine, Ser: 1.09 mg/dL (ref 0.76–1.27)
Glucose: 113 mg/dL — ABNORMAL HIGH (ref 65–99)
Potassium: 3.9 mmol/L (ref 3.5–5.2)
Sodium: 141 mmol/L (ref 134–144)
eGFR: 88 mL/min/{1.73_m2} (ref >59)

## 2020-09-09 MED ORDER — LISINOPRIL 40 MG PO TABS: 40.0000 mg | ORAL_TABLET | Freq: Every day | ORAL | 0 refills | Status: DC

## 2020-09-09 MED ORDER — SPIRONOLACTONE 25 MG PO TABS: 25.0000 mg | ORAL_TABLET | Freq: Every day | ORAL | 0 refills | Status: DC

## 2020-09-09 MED ORDER — METFORMIN HCL 500 MG PO TABS: 500.0000 mg | ORAL_TABLET | Freq: Every day | ORAL | 0 refills | Status: DC

## 2020-09-09 MED ORDER — FUROSEMIDE 80 MG PO TABS: 80.0000 mg | ORAL_TABLET | Freq: Two times a day (BID) | ORAL | 2 refills | Status: DC

## 2020-09-09 MED ORDER — CARVEDILOL 12.5 MG PO TABS: 12.5000 mg | ORAL_TABLET | Freq: Two times a day (BID) | ORAL | 2 refills | Status: DC

## 2020-09-09 NOTE — Progress Notes (Signed)
Kidney function normal.   Diabetes discussed in office.

## 2020-09-19 ENCOUNTER — Other Ambulatory Visit: Payer: Self-pay

## 2020-09-19 ENCOUNTER — Encounter (HOSPITAL_BASED_OUTPATIENT_CLINIC_OR_DEPARTMENT_OTHER): Payer: 59 | Attending: Internal Medicine | Admitting: Internal Medicine

## 2020-09-19 DIAGNOSIS — G4733 Obstructive sleep apnea (adult) (pediatric): Secondary | ICD-10-CM | POA: Diagnosis not present

## 2020-09-19 DIAGNOSIS — I87331 Chronic venous hypertension (idiopathic) with ulcer and inflammation of right lower extremity: Secondary | ICD-10-CM | POA: Insufficient documentation

## 2020-09-19 DIAGNOSIS — I509 Heart failure, unspecified: Secondary | ICD-10-CM | POA: Insufficient documentation

## 2020-09-19 DIAGNOSIS — Z86718 Personal history of other venous thrombosis and embolism: Secondary | ICD-10-CM | POA: Diagnosis not present

## 2020-09-19 DIAGNOSIS — L97818 Non-pressure chronic ulcer of other part of right lower leg with other specified severity: Secondary | ICD-10-CM | POA: Diagnosis present

## 2020-09-19 DIAGNOSIS — I110 Hypertensive heart disease with heart failure: Secondary | ICD-10-CM | POA: Insufficient documentation

## 2020-09-19 DIAGNOSIS — E785 Hyperlipidemia, unspecified: Secondary | ICD-10-CM | POA: Diagnosis not present

## 2020-09-19 DIAGNOSIS — E11622 Type 2 diabetes mellitus with other skin ulcer: Secondary | ICD-10-CM | POA: Diagnosis not present

## 2020-09-19 DIAGNOSIS — I890 Lymphedema, not elsewhere classified: Secondary | ICD-10-CM | POA: Insufficient documentation

## 2020-09-19 NOTE — Progress Notes (Addendum)
Scalisi, Leon T ([id number]) . Visit Report for 09/19/2020 Chief Complaint Document Details Patient Name: Date of Service: [name] 09/19/2020 2:45 PM Medical Record Number: [id number] Patient Account Number: [id number] Date of Birth/Sex: Treating RN: November 09, 1980 (40 y.o. [name], [name] Primary Care Provider: Marcy [name] Other Clinician: Referring Provider: Treating Provider/Extender: [name] in Treatment: 0 Information Obtained from: Patient Chief Complaint pt has blockage or absence of r iliac vein 05/22/2018; patient is here for review of wound on his right lower extremity 09/19/2020; patient is again here for wound review of wounds on his right lower leg Electronic Signature(s) Signed: 09/19/2020 5:25:56 PM By: Baltazar Najjar MD Entered By: Baltazar Najjar on 09/19/2020 16:42:21 -------------------------------------------------------------------------------- HPI Details Patient Name: Date of Service: [name], Shawn Oppenheim T. 09/19/2020 2:45 PM Medical Record Number: [id number] Patient Account Number: [id number] Date of Birth/Sex: Treating RN: 1980/12/15 (40 y.o. [name] Primary Care Provider: Marcy [name] Other Clinician: Referring Provider: Treating Provider/Extender: [name] in Treatment: 0 History of Present Illness Location: Patient presents with a wound to right lower leg. Quality: very chronic but clean Duration: over 1 year Modifying Factors: r iliac obs or absence, obesity HPI Description: The patient is a 40 yrs old bm here for evaluation of his right leg ulcer. He has an extensive history of lymphedema and ulcers. He is being [facility] by Dr. [name] with [name] boots and the [facility]. He has pumps at home but he is only using them once a day. 08/30/14 selective debridement done of surface eschar. The wound cleans up quite nicely in the bed of this looks healthy. He is using  a wound VAC under an Unna wrap. 11/17/14; selective debridement done of surface eschar. Once again the wound beds at clean up quite nicely. He is no longer using a wound VAC. Recent dressing changes include Hydrofera Blue. Apparently he has done a wide variety of different topical dressings including advanced treatment options like Apligraf's without success. 03/21/15; surgical debridement done of surface eschar and nonviable subcutaneous tissue. The area on the medial aspect of the right leg has closed and the wound on the lateral right leg looks improved has been using Silver Collagen 04/11/15; I think attendance here is sporadic. The area on the right medial leg remains healed. He has a new tiny area on the back of the right leg. Again a surgical debridement of the surface eschar and nonviable subcutaneous tissue of the major wound on the right lateral leg. He has been using Silver Collagen and at home, he does his own Unna wraps at home edema control seems reasonable. 04/20/15; the area on the right medial leg has a very superficial area which may not even be open nevertheless I felt needed to be dressed today [this is his original chronic wound] the new wound is on the right anterior lateral leg is underwent a surgical debridement. He has a small wound on the right posterior leg. He puts on his own Unna boots, according to our nurses he does this fairly well. We have been using Silver collagen. 05/02/2015 -- I understand in the past his venous studies have been done at [facility] and he was advised weight loss before they would attempt to tackle his iliac vein blockage. He has not gone back for review. 06/27/2015 -- we have applied for Apligraf and are awaiting his insurance clearance. 07/25/2015 -- he still has to use her back from  his insurance agent regarding his copayment for his Apligraf. 07/31/2015 -- the patient got a reply from the insurance agent regarding the dollar payment for his  application but is not sure whether it is for 5 or for one. 11/28/2015 -- has been hurting a little bit more and he has had change in color of his wound especially on the medial part. 12/05/2015 -- his culture was positive for Proteus mirabilis and K Oxytoca which are sensitive to ciprofloxacin which he is already on. 10/3/17still on ciprofloxacin. His mother reports of dressing had to be changed last Friday due to odor. He has not been systemically unwell 12/19/15; patient came in today complaining of increasing pain and tightness in his upper right thigh. He completed the ciprofloxacin 2 or 3 days ago. He is not running a fever today. 12/26/2015 -- last week he had been seen by Dr. [name] who got a lower extremity venous duplex evaluation done which did not show DVT or SVT in the right lower extremity. he had also put him on Augmentin in addition to the previous ciprofloxacin and he had received for 2 weeks 01/02/2016 -- -- was admitted to the hospital on 12/26/2015 and discharged on 12/29/2015 and was treated for cellulitis. He was also newly diagnosed with type 2 diabetes mellitus and outpatient monitoring and initiation of treatment was recommended. Patients hemoglobin A1c was 6.6 No osteomyelitis detected on x-rays and recent Doppler study was negative for DVT Oral doxycycline and Levaquin were recommended for the patient as an . outpatient for a 14 day treatment. IV Zosyn and vancomycin was given due to concerns of Pseudomonas infection while he was in hospital. 02/13/2016 -- he has not gone back to [facility] where he had his vascular opinion earlier and I have urged him to regroup with them to see if there is any surgical options available. 02/27/2016 -- has an appointment to see [facility] vascular surgeons on January 3 and may be seeing Dr. Verdie Drown 03/19/2016 -- I was not able to find any notes on Epic but the patient did go to the vascular clinic at [facility] and saw  the PA to Dr. Verdie Drown. I understand she has recommended a CT scan and MRI to be done on February 1 and they would review this with him on the same day. 04/09/2016 -- was admitted to the hospital on 03/20/2016 acute respiratory failure with hypoxia, abdominal discomfort with erythema, hypertensive urgency and chronic wound of the right leg with morbid obesity. he was discharged home on 04/05/2016 and was to continue on IV antibiotics for 18 more days follow-up with the wound center and continue with his cardiologist. he has lost approximately 130 pounds and diuresed over 48 L. He was treated with IV Zosyn and ID recommended he continue this for 3 weeks more. The notes from [facility] wound clinic were noted where he was seen by the PA and she had taken cultures which grew MSSA and Pseudomonas. She had recommended edema control with a 4-layer compression and also referred him to the lymphedema clinic. A CT venogram was also planned for the future. 04/16/2016 -- at [facility] a CT pelvic venogram runoff was done on 04/11/2016 -- IMPRESSION:-Limited study secondary to poor opacification of venous structures. No definite evidence of acute deep vein thrombosis. 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 -Extensive right iliac chain and right inguinal adenopathy, favored to be  reactive/secondary to congestion. The patient continues on his IV antibiotics through his PICC line and has his cardiology opinion and also a bariatric surgery opinion coming up. 04/30/2016 -- He has completed his IV antibiotics through his PICC line. 05/14/2016 --he was seen by the PA, Ms Sluss, recommended alternate day dressing with compression and use Hibiclens and Dakin's solution with acetic acid on his wounds. 07/30/2016 -- he has still not got his custom-made compression stockings and I have urged him to go and get him self measured for  these. 08/06/2016 -- he has been measured for his custom stocking and will get in 2 weeks. He has lost 20 pounds since March 08/27/2016 -- he has not got his custom stockings yet and he tried another pair which has not fit well and he has had a lot of maceration increase the size of his wound. 09/03/2016 -- the patient says that he has not been very compliant with his dressing changes and his elevation and exercise and this week he is notices wound get rather large with maceration. 09/18/16; according to our intake nurse the measurements on this patient's wound are slightly larger. I note previous compliance concerns. I note that his wounds measured larger last week which was noted by Dr. Meyer RusselBritto. Culture done last week was negative. 10/01/2016 -- the patient has received some lymphedema pumps which are new and he says he is being compliant with this. He is going to be [place] for [place] 10/15/[place] -- he returns after 2 weeks and tells me he has been diligent with his dressing and has lost 25 pounds over the last 3 months. 10/22/2016 -- his last hemoglobin A1c was 6.2 and he is now diet controlled. 11/19/2016 -- he has been having problems with his compression stockings [facility] He has not yet been able to get them. He had taken out his compression because he had gone there and had no dressing on when he came here today READMISSION 05/22/2018 Mr. Bousquet is a now 40 year old man with a [place] history of wounds in his lower extremities secondary to chronic venous insufficiency with secondary lymphedema. He has had previous vein ligations on the right although I do not have this information in front of me. As I understand things he also has central venous obstruction with a vein bypass in 2005 I [facility] He tells me over the last 2 months he has noted mid reopening in the right mid anterior lower extremity. This is a rectangular  shaped wound which is kind of odd in terms of how that formed. He has been using silver alginate and Unna boots that we used on him when he was here in 2018. He buys his product supply online thinks this is cheaper than ordering through intermediary's Past medical history; CHF, morbid obesity, hypertension, hyperlipidemia, chronic venous insufficiency, vein bypass in 2005 previous vein ablations or ligations. He has a stent in the right lower extremity. ABI in this clinic was 1.04. He is using his compression pumps at home 3/27; 2-week follow-up. Right lower extremity wounds related to chronic venous insufficiency and lymphedema. He has been using silver alginate under an Radio broadcast assistantUnna boot. He change the Foot LockerUnna boot himself at home last week. He is making nice progress 4/10; 2-week follow-up. Right lower extremity wound is just about closed a small open area in the middle of the and large rectangular wound he had at the beginning. His  edema control is excellent. He says he is using his compression pumps religiously 4/17; 2-week follow-up. Right lower extremity wound is totally closed. He had a large rectangular wound which is progressively closed. His edema control is very good. He is using his compression pumps once to twice a day READMISSION 09/19/2020 Mr. Maute is now a 40 year old man we have had for several stays in this clinic including 2017, 2018 and most recently in 2020 with a wound on his right lower leg. He has compression pumps which he claims to be using twice a day. He also has stockings however I am not sure how much he uses them. Things have broken down over the last month or 2 he has an extensive wound area on the right mid tibial area I think this is similar to what he has had in the past. He has been using Xeroform and an Ace wrap that his girlfriend is applying. Past medical history; the patient has known chronic venous insufficiency with secondary lymphedema. He had an iliac vein blockage  and he had a bypass at [facility] although the patient is not followed up with them. The surgeon may have been Dr. Jacolyn Reedy. As noted he has lymphedema pumps. Type 2 diabetes congestive heart failure obstructive sleep apnea. He has a history of a right leg DVT although this may have been the iliac vein he is not currently on anticoagulation ABI in our clinic is 1.37 on the right Imaging Results - in this encounter CT Pelvic Venogram Run Off Imaging Results - CT Pelvic Venogram Run Off Impressions Performed At -Limited study secondary to poor opacification of venous structures. Nodefinite evidence of acute deep vein thrombosis. 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 -Extensive right iliac chain and right inguinal adenopathy, favored to bereactive/secondary to congestion. [facility] Imaging Results - CT Pelvic Venogram Run Off Narrative Performed At EXAM: CT PELVIC VENOGRAM RUN OFF DATE: 04/11/2016 1:28 PM ACCESSION: [id number] UN DICTATED: 04/11/2016 2:18 PM INTERPRETATION LOCATION: [facility] CLINICAL INDICATION: 40 years old Male with h/o RLE DVT and massive lymphedema of both LE's. Eval for venous outflow obstruction vs extrinsic mass- L97.203-Skin ulcer of calf with necrosis of muscle, unspecified laterality(RAF-HCC) COMPARISON: CT abdomen pelvis dated 02/11/2007 TECHNIQUE: A spiral CT scan was obtained approximately 3 minutes after administration of IV contrast from the kidneys to the knees.Images were reconstructed in the axial plane. Multiplanar reformatted and MIP images were provided for further evaluation of the vessels. For selected cases, 3D volumerendered images are also provided. VASCULAR FINDINGS: There is overall poor contrast opacification of the venous structures, limitingevaluation. IVC: No evidence of thrombus. Iliac veins: The right iliac veins are chronically occluded/diminutive  in size. There are extensive venous collaterals noted throughout the right pelvis extending into the right lower extremity. The number and caliber of the venous collaterals have increased when compared to 02/11/2007. The left iliac veins appear patent. There are also small caliber venous collaterals within the left pelvis extending into the left lower extremity. Anterior abdominal wall venouscollaterals are also noted but fully imaged secondary to patient diameter. The bilateral femoral and popliteal veins appear patent. There is no evidence of acute thrombus. The arterial vasculature is unremarkable on this venous phase time study. NONVASCULAR FINDINGS: LOWER CHEST Unremarkable. : ABDOMEN: HEPATOBILIARY: Unremarkable liver. No biliary ductal dilatation. Gallbladder isremarkable. PANCREAS: Unremarkable. SPLEEN: Unremarkable. ADRENAL GLANDS: Unremarkable. KIDNEYS/URETERS: Unremarkable. BLADDER: Unremarkable. BOWEL/PERITONEUM/RETROPERITONEUM: No bowel obstruction. No acute inflammatoryprocess. No  ascites. LYMPH NODES: Extensive right iliac chain and right inguinal adenopathy. A nodal conglomerate in the right inguinal region measures 6.8 x 3.4 cm (2:124). This is only slightly larger when compared to 02/11/2007. Given interval stabilityover 10 years, these are favored to be reactive/secondary to congestion. REPRODUCTIVE: Unremarkable. BONES/SOFT TISSUES: No acute osseous abnormalities. Postsurgical changes in the right inguinal and left medial thigh region. Right greater than left softtissue edema throughout the lower extremities. [facility] Imaging Results - CT Pelvic Venogram Run Off Procedure Note Interface, Rad Results In - 04/11/2016 3:28 PM EST EXAM: CT PELVIC VENOGRAM RUN OFF DATE: 04/11/2016 1:28 PM ACCESSION: [id number] UN DICTATED: 04/11/2016 2:18 PM INTERPRETATION LOCATION: [facility] CLINICAL INDICATION: 40 years old Male with h/o RLE DVT and massive lymphedema of both LE's. Eval  for venous outflow obstruction vs extrinsic mass- L97.203-Skin ulcer of calf with necrosis of muscle, unspecified laterality (RAF-HCC) COMPARISON: CT abdomen pelvis dated 02/11/2007 TECHNIQUE: A spiral CT scan was obtained approximately 3 minutes after administration of IV contrast from the kidneys to the knees. Images were reconstructed in the axial plane. Multiplanar reformatted and MIP images were provided for further evaluation of the vessels. For selected cases, 3D volume rendered images are also provided. VASCULAR FINDINGS: There is overall poor contrast opacification of the venous structures, limiting evaluation. IVC: No evidence of thrombus. Iliac veins: The right iliac veins are chronically occluded/diminutive in size. There are extensive venous collaterals noted throughout the right pelvis extending into the right lower extremity. The number and caliber of the venous collaterals have increased when compared to 02/11/2007. The left iliac veins appear patent. There are also small caliber venous collaterals within the left pelvis extending into the left lower extremity. Anterior abdominal wall venous collaterals are also noted but fully imaged secondary to patient diameter. The bilateral femoral and popliteal veins appear patent. There is no evidence of acute thrombus. The arterial vasculature is unremarkable on this venous phase time study. NONVASCULAR FINDINGS: LOWER CHEST Unremarkable. : ABDOMEN: HEPATOBILIARY: Unremarkable liver. No biliary ductal dilatation. Gallbladder is remarkable. PANCREAS: Unremarkable. SPLEEN: Unremarkable. ADRENAL GLANDS: Unremarkable. KIDNEYS/URETERS: Unremarkable. BLADDER: Unremarkable. BOWEL/PERITONEUM/RETROPERITONEUM: No bowel obstruction. No acute inflammatory process. No ascites. LYMPH NODES: Extensive right iliac chain and right inguinal adenopathy. A nodal conglomerate in the right inguinal region measures 6.8 x 3.4 cm (2:124). This is only  slightly larger when compared to 02/11/2007. Given interval stability over 10 years, these are favored to be reactive/secondary to congestion. REPRODUCTIVE: Unremarkable. BONES/SOFT TISSUES: No acute osseous abnormalities. Postsurgical changes in the right inguinal and left medial thigh region. Right greater than left soft tissue edema throughout the lower extremities. IMPRESSION: -Limited study secondary to poor opacification of venous structures. No definite evidence of acute deep vein thrombosis. 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 -Extensive right iliac chain and right inguinal adenopathy, favored to be reactive/secondary to congestion. Imaging Results - CT Pelvic Venogram Run Off Performing Organization Address City/State/Zipcode Phone Number [facility] [address] [place], [place] [address] Surgical summary as listed below; Assessment: Jemaine Prokop is a 40 y.o. male with Blea history of right lower extremity chronic obstructive deep venous disease with recurrent ulceration, absence of right iliac vein, prior left to right femoral vein bypass. Patient continues with recurrent ulceration, his weight precludes him from further vascular studies at this time. If he is able to lose 50-100 pounds we would be able to do left lower extremity venogram possible  intervention with 50% chance of re- opening occlusion per Dr. Jacolyn Reedy. For global health, weight loss, potential improvement in his chronic venous insufficiency would appreciateinput from bariatric surgery group for recommendations. Electronic Signature(s) Signed: 09/20/2020 5:03:28 PM By: Baltazar Najjar MD Previous Signature: 09/19/2020 5:25:56 PM Version By: Baltazar Najjar MD Entered By: Baltazar Najjar on 09/20/2020 07:31:55 -------------------------------------------------------------------------------- Physical Exam Details Patient Name:  Date of Service: [name], [name] 09/19/2020 2:45 PM Medical Record Number: [id number] Patient Account Number: [id number] Date of Birth/Sex: Treating RN: 30-Jan-1981 (40 y.o. [name] Primary Care Provider: Marcy [name] Other Clinician: Referring Provider: Treating Provider/Extender: [name] in Treatment: 0 Constitutional Patient is hypertensive. Repeated at a blood pressure of 90 diastolic. Pulse regular and within target range for patient.[name] Respirations regular, non-labored and within target range.. Temperature is normal and within the target range for the patient.[name] Appears in no distress. Cardiovascular Heart rhythm and rate regular, without murmur or gallop.. Pedal pulses are palpable. Poorly controlled nonpitting lymphedema extending well up into his right thigh. Much larger than the left side.. Notes Wound exam; he has a narrow rectangle of wounds extending from the anterior all the way posteriorly in the right calf.'s probably 4 areas that are deeper than the superficial open area. All of these are covered in adherent surface slough but I did not debride these because of the amount of edema. No clear evidence of infection in the right lower leg HOWEVER on the medial right upper leg there is significant inflammation and some tenderness. The medial right thigh is warmer than the left side Electronic Signature(s) Signed: 09/19/2020 5:25:56 PM By: Baltazar Najjar MD Entered By: Baltazar Najjar on 09/19/2020 16:50:12 -------------------------------------------------------------------------------- Physician Orders Details Patient Name: Date of Service: [name], Shawn Oppenheim T. 09/19/2020 2:45 PM Medical Record Number: [id number] Patient Account Number: [id number] Date of Birth/Sex: Treating RN: December 22, 1980 (40 y.o. [name] Primary Care Provider: Marcy [name] Other Clinician: Referring Provider: Treating Provider/Extender:  [name] in Treatment: 0 Verbal / Phone Orders: No Diagnosis Coding Follow-up Appointments ppointment in 1 week. - Tuesday with Dr. [name] Return A Bathing/ Shower/ Hygiene May shower with protection but do not get wound dressing(s) wet. - May use a cast wrap to put over wraps to shower; you can buy these at [facility] or [facility] Edema Control - Lymphedema / SCD / Other Lymphedema Pumps. Use Lymphedema pumps on leg(s) 2-3 times a day for 45-60 minutes. If wearing any wraps or hose, do not remove them. Continue exercising as instructed. Elevate legs to the level of the heart or above for 30 minutes daily and/or when sitting, a frequency of: Avoid standing for Plaza periods of time. Patient to wear own compression stockings every day. - on Left Leg Wound Treatment Wound #32 - Lower Leg Wound Laterality: Right, Medial Cleanser: Soap and Water 1 x Per Week/15 Days Discharge Instructions: May shower and wash wound with dial antibacterial soap and water prior to dressing change. Cleanser: Wound Cleanser 1 x Per Week/15 Days Discharge Instructions: Cleanse the wound with wound cleanser prior to applying a clean dressing using gauze sponges, not tissue or cotton balls. Peri-Wound Care: Triamcinolone 15 (g) 1 x Per Week/15 Days Discharge Instructions: Use triamcinolone 15 (g) as directed Peri-Wound Care: Sween Lotion (Moisturizing lotion) 1 x Per Week/15 Days Discharge Instructions: Apply moisturizing lotion as directed Prim Dressing: KerraCel Ag Gelling Fiber Dressing, 4x5 in (silver alginate) 1 x Per Week/15 Days ary Discharge  Instructions: Apply silver alginate to wound bed as instructed Secondary Dressing: Woven Gauze Sponge, Non-Sterile 4x4 in 1 x Per Week/15 Days Discharge Instructions: Apply over primary dressing as directed. Secondary Dressing: ABD Pad, 5x9 1 x Per Week/15 Days Discharge Instructions: Apply over primary dressing as directed. Secondary  Dressing: Zetuvit Plus 4x8 in 1 x Per Week/15 Days Discharge Instructions: Apply over primary dressing as directed. Compression Wrap: FourPress (4 layer compression wrap) 1 x Per Week/15 Days Discharge Instructions: Apply four layer compression as directed. May also use Miliken CoFlex 2 layer compression system as alternative. Wound #33 - Lower Leg Wound Laterality: Right, Lateral Cleanser: Soap and Water 1 x Per Week/15 Days Discharge Instructions: May shower and wash wound with dial antibacterial soap and water prior to dressing change. Cleanser: Wound Cleanser 1 x Per Week/15 Days Discharge Instructions: Cleanse the wound with wound cleanser prior to applying a clean dressing using gauze sponges, not tissue or cotton balls. Peri-Wound Care: Triamcinolone 15 (g) 1 x Per Week/15 Days Discharge Instructions: Use triamcinolone 15 (g) as directed Peri-Wound Care: Sween Lotion (Moisturizing lotion) 1 x Per Week/15 Days Discharge Instructions: Apply moisturizing lotion as directed Prim Dressing: KerraCel Ag Gelling Fiber Dressing, 4x5 in (silver alginate) 1 x Per Week/15 Days ary Discharge Instructions: Apply silver alginate to wound bed as instructed Secondary Dressing: Woven Gauze Sponge, Non-Sterile 4x4 in 1 x Per Week/15 Days Discharge Instructions: Apply over primary dressing as directed. Secondary Dressing: ABD Pad, 5x9 1 x Per Week/15 Days Discharge Instructions: Apply over primary dressing as directed. Secondary Dressing: Zetuvit Plus 4x8 in 1 x Per Week/15 Days Discharge Instructions: Apply over primary dressing as directed. Compression Wrap: FourPress (4 layer compression wrap) 1 x Per Week/15 Days Discharge Instructions: Apply four layer compression as directed. May also use Miliken CoFlex 2 layer compression system as alternative. Patient Medications llergies: peanut, lidocaine A Notifications Medication Indication Start End cellulitis right leg 09/19/2020 cephalexin DOSE oral  500 mg capsule - 1 capsule oral q6h for 7 days Electronic Signature(s) Signed: 09/19/2020 4:55:18 PM By: Baltazar Najjar MD Entered By: Baltazar Najjar on 09/19/2020 16:55:17 -------------------------------------------------------------------------------- Problem List Details Patient Name: Date of Service: [name], Shawn Oppenheim T. 09/19/2020 2:45 PM Medical Record Number: [id number] Patient Account Number: [id number] Date of Birth/Sex: Treating RN: 10/04/1980 (40 y.o. [name], [name] Primary Care Provider: Marcy [name] Other Clinician: Referring Provider: Treating Provider/Extender: [name] in Treatment: 0 Active Problems ICD-10 Encounter Code Description Active Date MDM Diagnosis I87.331 Chronic venous hypertension (idiopathic) with ulcer and inflammation of right 09/19/2020 No Yes lower extremity I89.0 Lymphedema, not elsewhere classified 09/19/2020 No Yes L97.818 Non-pressure chronic ulcer of other part of right lower leg with other specified 09/19/2020 No Yes severity Inactive Problems Resolved Problems Electronic Signature(s) Signed: 09/19/2020 5:25:56 PM By: Baltazar Najjar MD Entered By: Baltazar Najjar on 09/19/2020 16:41:38 -------------------------------------------------------------------------------- Progress Note Details Patient Name: Date of Service: [name], Shawn Oppenheim T. 09/19/2020 2:45 PM Medical Record Number: [id number] Patient Account Number: [id number] Date of Birth/Sex: Treating RN: 1981/02/21 (40 y.o. [name], [name] Primary Care Provider: Marcy [name] Other Clinician: Referring Provider: Treating Provider/Extender: [name] in Treatment: 0 Subjective Chief Complaint Information obtained from Patient pt has blockage or absence of r iliac vein 05/22/2018; patient is here for review of wound on his right lower extremity 09/19/2020; patient is again here for wound review of wounds on  his right lower leg History of Present Illness (HPI) The following  HPI elements were documented for the patient's wound: Location: Patient presents with a wound to right lower leg. Quality: very chronic but clean Duration: over 1 year Modifying Factors: r iliac obs or absence, obesity The patient is a 40 yrs old bm here for evaluation of his right leg ulcer. He has an extensive history of lymphedema and ulcers. He is being treated at the [facility] by Dr. [name] with [name] boots and the [facility]. He has pumps at home but he is only using them once a day. 08/30/14 selective debridement done of surface eschar. The wound cleans up quite nicely in the bed of this looks healthy. He is using a wound VAC under an Unna wrap. 11/17/14; selective debridement done of surface eschar. Once again the wound beds at clean up quite nicely. He is no longer using a wound VAC. Recent dressing changes include Hydrofera Blue. Apparently he has done a wide variety of different topical dressings including advanced treatment options like Apligraf's without success. 03/21/15; surgical debridement done of surface eschar and nonviable subcutaneous tissue. The area on the medial aspect of the right leg has closed and the wound on the lateral right leg looks improved has been using Silver Collagen 04/11/15; I think attendance here is sporadic. The area on the right medial leg remains healed. He has a new tiny area on the back of the right leg. Again a surgical debridement of the surface eschar and nonviable subcutaneous tissue of the major wound on the right lateral leg. He has been using Silver Collagen and at home, he does his own Unna wraps at home edema control seems reasonable. 04/20/15; the area on the right medial leg has a very superficial area which may not even be open nevertheless I felt needed to be dressed today [this is his original chronic wound] the new wound is on the right anterior lateral leg is underwent a surgical  debridement. He has a small wound on the right posterior leg. He puts on his own Unna boots, according to our nurses he does this fairly well. We have been using Silver collagen. 05/02/2015 -- I understand in the past his venous studies have been done at [facility] and he was advised weight loss before they would attempt to tackle his iliac vein blockage. He has not gone back for review. 06/27/2015 -- we have applied for Apligraf and are awaiting his insurance clearance. 07/25/2015 -- he still has to use her back from his insurance agent regarding his copayment for his Apligraf. 07/31/2015 -- the patient got a reply from the insurance agent regarding the dollar payment for his application but is not sure whether it is for 5 or for one. 11/28/2015 -- has been hurting a little bit more and he has had change in color of his wound especially on the medial part. 12/05/2015 -- his culture was positive for Proteus mirabilis and K Oxytoca which are sensitive to ciprofloxacin which he is already on. 12/12/15oostill on ciprofloxacin. His mother reports of dressing had to be changed last Friday due to odor. He has not been systemically unwell 12/19/15; patient came in today complaining of increasing pain and tightness in his upper right thigh. He completed the ciprofloxacin 2 or 3 days ago. He is not running a fever today. 12/26/2015 -- last week he had been seen by Dr. [name] who got a lower extremity venous duplex evaluation done which did not show DVT or SVT in the right lower extremity. he had also put him on  Augmentin in addition to the previous ciprofloxacin and he had received for 2 weeks 01/02/2016 -- -- was admitted to the hospital on 12/26/2015 and discharged on 12/29/2015 and was treated for cellulitis. He was also newly diagnosed with type 2 diabetes mellitus and outpatient monitoring and initiation of treatment was recommended. Patientoos hemoglobin A1c was 6.6 No osteomyelitis detected on x-rays  and recent Doppler study was negative for DVT Oral doxycycline and Levaquin were recommended for the patient as an . outpatient for a 14 day treatment. IV Zosyn and vancomycin was given due to concerns of Pseudomonas infection while he was in hospital. 02/13/2016 -- he has not gone back to [facility] where he had his vascular opinion earlier and I have urged him to regroup with them to see if there is any surgical options available. 02/27/2016 -- has an appointment to see [facility] vascular surgeons on January 3 and may be seeing Dr. Verdie Drown 03/19/2016 -- I was not able to find any notes on Epic but the patient did go to the vascular clinic at [facility] and saw the PA to Dr. Verdie Drown. I understand she has recommended a CT scan and MRI to be done on February 1 and they would review this with him on the same day. 04/09/2016 -- was admitted to the hospital on 03/20/2016 acute respiratory failure with hypoxia, abdominal discomfort with erythema, hypertensive urgency and chronic wound of the right leg with morbid obesity. he was discharged home on 04/05/2016 and was to continue on IV antibiotics for 18 more days follow-up with the wound center and continue with his cardiologist. he has lost approximately 130 pounds and diuresed over 48 L. He was treated with IV Zosyn and ID recommended he continue this for 3 weeks more. The notes from [facility] wound clinic were noted where he was seen by the PA and she had taken cultures which grew MSSA and Pseudomonas. She had recommended edema control with a 4-layer compression and also referred him to the lymphedema clinic. A CT venogram was also planned for the future. 04/16/2016 -- at [facility] a CT pelvic venogram runoff was done on 04/11/2016 -- IMPRESSION:-Limited study secondary to poor opacification of venous structures. No definite evidence of acute deep vein thrombosis. 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 -Extensive right iliac chain and right inguinal adenopathy, favored to be reactive/secondary to congestion. The patient continues on his IV antibiotics through his PICC line and has his cardiology opinion and also a bariatric surgery opinion coming up. 04/30/2016 -- He has completed his IV antibiotics through his PICC line. 05/14/2016 --he was seen by the PA, Ms Sluss, recommended alternate day dressing with compression and use Hibiclens and Dakin's solution with acetic acid on his wounds. 07/30/2016 -- he has still not got his custom-made compression stockings and I have urged him to go and get him self measured for these. 08/06/2016 -- he has been measured for his custom stocking and will get in 2 weeks. He has lost 20 pounds since March 08/27/2016 -- he has not got his custom stockings yet and he tried another pair which has not fit well and he has had a lot of maceration increase the size of his wound. 09/03/2016 -- the patient says that he has not been very compliant with his dressing changes and his elevation and exercise and this week he is notices wound get rather  large with maceration. 09/18/16; according to our intake nurse the measurements on this patient's wound are slightly larger. I note previous compliance concerns. I note that his wounds measured larger last week which was noted by Dr. Meyer Russel. Culture done last week was negative. 10/01/2016 -- the patient has received some lymphedema pumps which are new and he says he is being compliant with this. He is going to be away for 2 weeks on vacation to [facility] 10/15/2016 -- he returns after 2 weeks and tells me he has been diligent with his dressing and has lost 25 pounds over the last 3 months. 10/22/2016 -- his last hemoglobin A1c was 6.2 and he is now diet controlled. 11/19/2016 -- he has been having problems with his compression stockings which are custom made at  [facility]. He has not yet been able to get them. He had taken out his compression because he had gone there and had no dressing on when he came here today READMISSION 05/22/2018 Mr. Ayyad is a now 40 year old man with a Jerrett history of wounds in his lower extremities secondary to chronic venous insufficiency with secondary lymphedema. He has had previous vein ligations on the right although I do not have this information in front of me. 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 He tells me over the last 2 months he has noted mid reopening in the right mid anterior lower extremity. This is a rectangular shaped wound which is kind of odd in terms of how that formed. He has been using silver alginate and Unna boots that we used on him when he was here in 2018. He buys his product supply online thinks this is cheaper than ordering through intermediary's Past medical history; CHF, morbid obesity, hypertension, hyperlipidemia, chronic venous insufficiency, vein bypass in 2005 previous vein ablations or ligations. He has a stent in the right lower extremity. ABI in this clinic was 1.04. He is using his compression pumps at home 3/27; 2-week follow-up. Right lower extremity wounds related to chronic venous insufficiency and lymphedema. He has been using silver alginate under an Radio broadcast assistant. He change the [employer] himself at home last week. He is making nice progress 4/10; 2-week follow-up. Right lower extremity wound is just about closed a small open area in the middle of the and large rectangular wound he had at the beginning. His edema control is excellent. He says he is using his compression pumps religiously 4/17; 2-week follow-up. Right lower extremity wound is totally closed. He had a large rectangular wound which is progressively closed. His edema control is very good. He is using his compression pumps once to twice a  day READMISSION 09/19/2020 Mr. Makarewicz is now a 40 year old man we have had for several stays in this clinic including 2017, 2018 and most recently in 2020 with a wound on his right lower leg. He has compression pumps which he claims to be using twice a day. He also has stockings however I am not sure how much he uses them. Things have broken down over the last month or 2 he has an extensive wound area on the right mid tibial area I think this is similar to what he has had in the past. He has been using Xeroform and an Ace wrap that his girlfriend is applying. Past medical history; the patient has known chronic venous insufficiency with secondary lymphedema. He had an iliac vein  blockage and he had a bypass at [facility] although the patient is not followed up with them. The surgeon may have been Dr. Jacolyn Reedy. As noted he has lymphedema pumps. Type 2 diabetes congestive heart failure obstructive sleep apnea. He has a history of a right leg DVT although this may have been the iliac vein he is not currently on anticoagulation ABI in our clinic is 1.37 on the right Imaging Results - in this encounter CT Pelvic Venogram Run Off Imaging Results - CT Pelvic Venogram Run Off Impressions Performed At -Limited study secondary to poor opacification of venous structures. Nodefinite evidence of acute deep vein thrombosis. 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 -Extensive right iliac chain and right inguinal adenopathy, favored to bereactive/secondary to congestion. [facility] Imaging Results - CT Pelvic Venogram Run Off Narrative Performed At EXAM: CT PELVIC VENOGRAM RUN OFF DATE: 04/11/2016 1:28 PM ACCESSION: [id number] UN DICTATED: 04/11/2016 2:18 PM INTERPRETATION LOCATION: [facility] CLINICAL INDICATION: 40 years old Male with h/o RLE DVT and massive lymphedema of both LE's. Eval for venous outflow obstruction vs  extrinsic mass- L97.203-Skin ulcer of calf with necrosis of muscle, unspecified laterality(RAF-HCC) COMPARISON: CT abdomen pelvis dated 02/11/2007 TECHNIQUE: A spiral CT scan was obtained approximately 3 minutes after administration of IV contrast from the kidneys to the knees. Images were reconstructed in the axial plane. Multiplanar reformatted and MIP images were provided for further evaluation of the vessels. For selected cases, 3D volumerendered images are also provided. VASCULAR FINDINGS: There is overall poor contrast opacification of the venous structures, limitingevaluation. IVC: No evidence of thrombus. Iliac veins: The right iliac veins are chronically occluded/diminutive in size. There are extensive venous collaterals noted throughout the right pelvis extending into the right lower extremity. The number and caliber of the venous collaterals have increased when compared to 02/11/2007. The left iliac veins appear patent. There are also small caliber venous collaterals within the left pelvis extending into the left lower extremity. Anterior abdominal wall venouscollaterals are also noted but fully imaged secondary to patient diameter. The bilateral femoral and popliteal veins appear patent. There is no evidence of acute thrombus. The arterial vasculature is unremarkable on this venous phase time study. NONVASCULAR FINDINGS: LOWER CHEST Unremarkable. : ABDOMEN: HEPATOBILIARY: Unremarkable liver. No biliary ductal dilatation. Gallbladder isremarkable. PANCREAS: Unremarkable. SPLEEN: Unremarkable. ADRENAL GLANDS: Unremarkable. KIDNEYS/URETERS: Unremarkable. BLADDER: Unremarkable. BOWEL/PERITONEUM/RETROPERITONEUM: No bowel obstruction. No acute inflammatoryprocess. No ascites. LYMPH NODES: Extensive right iliac chain and right inguinal adenopathy. A nodal conglomerate in the right inguinal region measures 6.8 x 3.4 cm (2:124). This is only slightly larger when compared to 02/11/2007.  Given interval stabilityover 10 years, these are favored to be reactive/secondary to congestion. REPRODUCTIVE: Unremarkable. BONES/SOFT TISSUES: No acute osseous abnormalities. Postsurgical changes in the right inguinal and left medial thigh region. Right greater than left softtissue edema throughout the lower extremities. [facility] Imaging Results - CT Pelvic Venogram Run Off Procedure Note Interface, Rad Results In - 04/11/2016 3:28 PM EST EXAM: CT PELVIC VENOGRAM RUN OFF DATE: 04/11/2016 1:28 PM ACCESSION: [id number] UN DICTATED: 04/11/2016 2:18 PM INTERPRETATION LOCATION: [facility] CLINICAL INDICATION: 40 years old Male with h/o RLE DVT and massive lymphedema of both LE's. Eval for venous outflow obstruction vs extrinsic mass- L97.203-Skin ulcer of calf with necrosis of muscle, unspecified laterality (RAF-HCC) COMPARISON: CT abdomen pelvis dated 02/11/2007 TECHNIQUE: A spiral CT scan was obtained approximately 3 minutes after administration of IV contrast from the kidneys to  the knees. Images were reconstructed in the axial plane. Multiplanar reformatted and MIP images were provided for further evaluation of the vessels. For selected cases, 3D volume rendered images are also provided. VASCULAR FINDINGS: There is overall poor contrast opacification of the venous structures, limiting evaluation. IVC: No evidence of thrombus. Iliac veins: The right iliac veins are chronically occluded/diminutive in size. There are extensive venous collaterals noted throughout the right pelvis extending into the right lower extremity. The number and caliber of the venous collaterals have increased when compared to 02/11/2007. The left iliac veins appear patent. There are also small caliber venous collaterals within the left pelvis extending into the left lower extremity. Anterior abdominal wall venous collaterals are also noted but fully imaged secondary to patient diameter. The bilateral femoral and popliteal  veins appear patent. There is no evidence of acute thrombus. The arterial vasculature is unremarkable on this venous phase time study. NONVASCULAR FINDINGS: LOWER CHEST Unremarkable. : ABDOMEN: HEPATOBILIARY: Unremarkable liver. No biliary ductal dilatation. Gallbladder is remarkable. PANCREAS: Unremarkable. SPLEEN: Unremarkable. ADRENAL GLANDS: Unremarkable. KIDNEYS/URETERS: Unremarkable. BLADDER: Unremarkable. BOWEL/PERITONEUM/RETROPERITONEUM: No bowel obstruction. No acute inflammatory process. No ascites. LYMPH NODES: Extensive right iliac chain and right inguinal adenopathy. A nodal conglomerate in the right inguinal region measures 6.8 x 3.4 cm (2:124). This is only slightly larger when compared to 02/11/2007. Given interval stability over 10 years, these are favored to be reactive/secondary to congestion. REPRODUCTIVE: Unremarkable. BONES/SOFT TISSUES: No acute osseous abnormalities. Postsurgical changes in the right inguinal and left medial thigh region. Right greater than left soft tissue edema throughout the lower extremities. IMPRESSION: -Limited study secondary to poor opacification of venous structures. No definite evidence of acute deep vein thrombosis. 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 -Extensive right iliac chain and right inguinal adenopathy, favored to be reactive/secondary to congestion. Imaging Results - CT Pelvic Venogram Run Off Performing Organization Address City/State/Zipcode Phone Number [facility] [address] [place], [place] [address] Surgical summary as listed below; Assessment: Wilhelm Ganaway is a 40 y.o. male with Rings history of right lower extremity chronic obstructive deep venous disease with recurrent ulceration, absence of right iliac vein, prior left to right femoral vein bypass. Patient continues with recurrent ulceration, his weight precludes him from  further vascular studies at this time. If he is able to lose 50-100 pounds we would be able to do left lower extremity venogram possible intervention with 50% chance of re- opening occlusion per Dr. Jacolyn Reedy. For global health, weight loss, potential improvement in his chronic venous insufficiency would appreciateinput from bariatric surgery group for recommendations. Patient History Information obtained from Patient. Allergies peanut, lidocaine (Reaction: burns) Family History Diabetes - Mother, No family history of Cancer, Heart Disease, Hereditary Spherocytosis, Hypertension, Kidney Disease, Lung Disease, Seizures, Stroke, Thyroid Problems, Tuberculosis. Social History Unknown if ever smoked, Marital Status - Single, Alcohol Use - Rarely, Drug Use - No History. Medical History Eyes Denies history of Cataracts, Glaucoma, Optic Neuritis Ear/Nose/Mouth/Throat Denies history of Chronic sinus problems/congestion, Middle ear problems Hematologic/Lymphatic Patient has history of Lymphedema - right leg Denies history of Anemia, Hemophilia, Human Immunodeficiency Virus, Sickle Cell Disease Respiratory Denies history of Aspiration, Asthma, Chronic Obstructive Pulmonary Disease (COPD), Pneumothorax, Sleep Apnea, Tuberculosis Cardiovascular Patient has history of Deep Vein Thrombosis - hx right leg, Hypertension, Peripheral Venous Disease - venous insufficiency and varicosities Denies history of Angina, Arrhythmia, Congestive Heart Failure, Coronary Artery Disease, Hypotension, Myocardial Infarction, Peripheral Arterial  Disease, Phlebitis, Vasculitis Gastrointestinal Denies history of Cirrhosis , Colitis, Crohnoos, Hepatitis A, Hepatitis B, Hepatitis C Endocrine Patient has history of Type II Diabetes Denies history of Type I Diabetes Genitourinary Denies history of End Stage Renal Disease Immunological Denies history of Lupus Erythematosus, Raynaudoos, Scleroderma Integumentary  (Skin) Denies history of History of Burn Musculoskeletal Denies history of Gout, Rheumatoid Arthritis, Osteoarthritis, Osteomyelitis Neurologic Denies history of Dementia, Neuropathy, Quadriplegia, Paraplegia, Seizure Disorder Oncologic Denies history of Received Chemotherapy, Received Radiation Psychiatric Denies history of Anorexia/bulimia, Confinement Anxiety Patient is treated with Controlled Diet, Oral Agents. Blood sugar is not tested. Hospitalization/Surgery History - tonsillectomy. - right knee ligament repair. Medical A Surgical History Notes nd Constitutional Symptoms (General Health) morbid obesity Review of Systems (ROS) Constitutional Symptoms (General Health) Denies complaints or symptoms of Fatigue, Fever, Chills, Marked Weight Change. Eyes Denies complaints or symptoms of Dry Eyes, Vision Changes, Glasses / Contacts. Ear/Nose/Mouth/Throat Denies complaints or symptoms of Chronic sinus problems or rhinitis. Respiratory Denies complaints or symptoms of Chronic or frequent coughs, Shortness of Breath. Cardiovascular Denies complaints or symptoms of Chest pain. Gastrointestinal Denies complaints or symptoms of Frequent diarrhea, Nausea, Vomiting. Endocrine Denies complaints or symptoms of Heat/cold intolerance. Genitourinary Denies complaints or symptoms of Frequent urination. Integumentary (Skin) Complains or has symptoms of Wounds - currently right leg. Musculoskeletal Denies complaints or symptoms of Muscle Pain, Muscle Weakness. Neurologic Denies complaints or symptoms of Numbness/parasthesias. Psychiatric Denies complaints or symptoms of Claustrophobia, Suicidal. Objective Constitutional Patient is hypertensive. Repeated at a blood pressure of 90 diastolic. Pulse regular and within target range for patient.[name] Respirations regular, non-labored and within target range.. Temperature is normal and within the target range for the patient.[name] Appears in no  distress. Vitals Time Taken: 3:00 PM, Height: 74 in, Source: Stated, Weight: 425 lbs, Source: Stated, BMI: 54.6, Temperature: 98.1 F, Pulse: 86 bpm, Respiratory Rate: 22 breaths/min, Blood Pressure: 152/113 mmHg. Cardiovascular Heart rhythm and rate regular, without murmur or gallop.. Pedal pulses are palpable. Poorly controlled nonpitting lymphedema extending well up into his right thigh. Much larger than the left side.. General Notes: Wound exam; he has a narrow rectangle of wounds extending from the anterior all the way posteriorly in the right calf.'s probably 4 areas that are deeper than the superficial open area. All of these are covered in adherent surface slough but I did not debride these because of the amount of edema. No clear evidence of infection in the right lower leg HOWEVER on the medial right upper leg there is significant inflammation and some tenderness. The medial right thigh is warmer than the left side Integumentary (Hair, Skin) Wound #32 status is Open. Original cause of wound was Gradually Appeared. The date acquired was: 07/11/2020. The wound is located on the Right,Medial Lower Leg. The wound measures 5.5cm length x 25cm width x 0.4cm depth; 107.992cm^2 area and 43.197cm^3 volume. There is Fat Layer (Subcutaneous Tissue) exposed. There is no tunneling or undermining noted. There is a large amount of serosanguineous drainage noted. The wound margin is distinct with the outline attached to the wound base. There is small (1-33%) red granulation within the wound bed. There is a large (67-100%) amount of necrotic tissue within the wound bed including Adherent Slough. Wound #33 status is Open. Original cause of wound was Gradually Appeared. The date acquired was: 07/10/2020. The wound is located on the Right,Lateral Lower Leg. The wound measures 2.5cm length x 2.5cm width x 0.1cm depth; 4.909cm^2 area and 0.491cm^3 volume. There is Fat Layer (Subcutaneous  Tissue) exposed. There is  no tunneling or undermining noted. There is a medium amount of serosanguineous drainage noted. The wound margin is distinct with the outline attached to the wound base. There is large (67-100%) red granulation within the wound bed. There is no necrotic tissue within the wound bed. Assessment Active Problems ICD-10 Chronic venous hypertension (idiopathic) with ulcer and inflammation of right lower extremity Lymphedema, not elsewhere classified Non-pressure chronic ulcer of other part of right lower leg with other specified severity Procedures Wound #32 Pre-procedure diagnosis of Wound #32 is a Lymphedema located on the Right,Medial Lower Leg . There was a Four Layer Compression Therapy Procedure by Fonnie Mu, RN. Post procedure Diagnosis Wound #32: Same as Pre-Procedure Wound #33 Pre-procedure diagnosis of Wound #33 is a Lymphedema located on the Right,Lateral Lower Leg . There was a Four Layer Compression Therapy Procedure by Fonnie Mu, RN. Post procedure Diagnosis Wound #33: Same as Pre-Procedure Plan Follow-up Appointments: Return Appointment in 1 week. - Tuesday with Dr. Chauncey Mann Shower/ Hygiene: May shower with protection but do not get wound dressing(s) wet. - May use a cast wrap to put over wraps to shower; you can buy these at [facility] or [facility] Edema Control - Lymphedema / SCD / Other: Lymphedema Pumps. Use Lymphedema pumps on leg(s) 2-3 times a day for 45-60 minutes. If wearing any wraps or hose, do not remove them. Continue exercising as instructed. Elevate legs to the level of the heart or above for 30 minutes daily and/or when sitting, a frequency of: Avoid standing for Mcwethy periods of time. Patient to wear own compression stockings every day. - on Left Leg The following medication(s) was prescribed: cephalexin oral 500 mg capsule 1 capsule oral q6h for 7 days for cellulitis right leg starting 09/19/2020 WOUND #32: - Lower Leg Wound Laterality: Right,  Medial Cleanser: Soap and Water 1 x Per Week/15 Days Discharge Instructions: May shower and wash wound with dial antibacterial soap and water prior to dressing change. Cleanser: Wound Cleanser 1 x Per Week/15 Days Discharge Instructions: Cleanse the wound with wound cleanser prior to applying a clean dressing using gauze sponges, not tissue or cotton balls. Peri-Wound Care: Triamcinolone 15 (g) 1 x Per Week/15 Days Discharge Instructions: Use triamcinolone 15 (g) as directed Peri-Wound Care: Sween Lotion (Moisturizing lotion) 1 x Per Week/15 Days Discharge Instructions: Apply moisturizing lotion as directed Prim Dressing: KerraCel Ag Gelling Fiber Dressing, 4x5 in (silver alginate) 1 x Per Week/15 Days ary Discharge Instructions: Apply silver alginate to wound bed as instructed Secondary Dressing: Woven Gauze Sponge, Non-Sterile 4x4 in 1 x Per Week/15 Days Discharge Instructions: Apply over primary dressing as directed. Secondary Dressing: ABD Pad, 5x9 1 x Per Week/15 Days Discharge Instructions: Apply over primary dressing as directed. Secondary Dressing: Zetuvit Plus 4x8 in 1 x Per Week/15 Days Discharge Instructions: Apply over primary dressing as directed. Com pression Wrap: FourPress (4 layer compression wrap) 1 x Per Week/15 Days Discharge Instructions: Apply four layer compression as directed. May also use Miliken CoFlex 2 layer compression system as alternative. WOUND #33: - Lower Leg Wound Laterality: Right, Lateral Cleanser: Soap and Water 1 x Per Week/15 Days Discharge Instructions: May shower and wash wound with dial antibacterial soap and water prior to dressing change. Cleanser: Wound Cleanser 1 x Per Week/15 Days Discharge Instructions: Cleanse the wound with wound cleanser prior to applying a clean dressing using gauze sponges, not tissue or cotton balls. Peri-Wound Care: Triamcinolone 15 (g) 1 x Per Week/15 Days Discharge  Instructions: Use triamcinolone 15 (g) as  directed Peri-Wound Care: Sween Lotion (Moisturizing lotion) 1 x Per Week/15 Days Discharge Instructions: Apply moisturizing lotion as directed Prim Dressing: KerraCel Ag Gelling Fiber Dressing, 4x5 in (silver alginate) 1 x Per Week/15 Days ary Discharge Instructions: Apply silver alginate to wound bed as instructed Secondary Dressing: Woven Gauze Sponge, Non-Sterile 4x4 in 1 x Per Week/15 Days Discharge Instructions: Apply over primary dressing as directed. Secondary Dressing: ABD Pad, 5x9 1 x Per Week/15 Days Discharge Instructions: Apply over primary dressing as directed. Secondary Dressing: Zetuvit Plus 4x8 in 1 x Per Week/15 Days Discharge Instructions: Apply over primary dressing as directed. Com pression Wrap: FourPress (4 layer compression wrap) 1 x Per Week/15 Days Discharge Instructions: Apply four layer compression as directed. May also use Miliken CoFlex 2 layer compression system as alternative. 1. We will use silver alginate under 4-layer compression. I do not think the patient has an arterial at issue that should inhibit this 2. He is going to use his compression pumps over our wrap. He will need stockings for the left leg 3. I am concerned about the amount of swelling in the right leg. Vis--vis the patency of what ever was done to his iliac vein at [facility] 7 years ago. I am going to have to have a look at what was done and who did it but I think it is likely he will need follow-up at [facility]. 4. I am going to start him on cephalexin 504 times a day for possible cellulitis in the right medial thigh in the setting of lymphedema. However all of this may be severe venous stasis related to central venous obstruction I spent 35 minutes in review of this patient's past medical history, face-to-face evaluation and preparation of this record Addendum; 09/20/2020; I have reviewed is much as I could at the [facility] vein and vascular Center. It is clear he has had central vein interventions. The  last notes I can find is 2015 at which time he was felt to be not a candidate for a standard venogram or surgery related to among other things morbid obesity. He was referred to bariatric surgery although I am not sure he ever went. I am doubtful that anything is changed since their last note of 2015. He had a CT venogram in 2018 I have included this in our record although I cannot really see who address this. Electronic Signature(s) Signed: 09/20/2020 7:35:40 AM By: Baltazar Najjar MD Previous Signature: 09/19/2020 5:25:56 PM Version By: Baltazar Najjar MD Entered By: Baltazar Najjar on 09/20/2020 07:35:40 -------------------------------------------------------------------------------- HxROS Details Patient Name: Date of Service: [name], A DRIA Dorris Carnes T. 09/19/2020 2:45 PM Medical Record Number: [id number] Patient Account Number: [id number] Date of Birth/Sex: Treating RN: 1980-06-12 (40 y.o. [name] Primary Care Provider: Marcy [name] Other Clinician: Referring Provider: Treating Provider/Extender: [name] in Treatment: 0 Information Obtained From Patient Constitutional Symptoms (General Health) Complaints and Symptoms: Negative for: Fatigue; Fever; Chills; Marked Weight Change Medical History: Past Medical History Notes: morbid obesity Eyes Complaints and Symptoms: Negative for: Dry Eyes; Vision Changes; Glasses / Contacts Medical History: Negative for: Cataracts; Glaucoma; Optic Neuritis Ear/Nose/Mouth/Throat Complaints and Symptoms: Negative for: Chronic sinus problems or rhinitis Medical History: Negative for: Chronic sinus problems/congestion; Middle ear problems Respiratory Complaints and Symptoms: Negative for: Chronic or frequent coughs; Shortness of Breath Medical History: Negative for: Aspiration; Asthma; Chronic Obstructive Pulmonary Disease (COPD); Pneumothorax; Sleep Apnea; Tuberculosis Cardiovascular Complaints and  Symptoms: Negative for: Chest  pain Medical History: Positive for: Deep Vein Thrombosis - hx right leg; Hypertension; Peripheral Venous Disease - venous insufficiency and varicosities Negative for: Angina; Arrhythmia; Congestive Heart Failure; Coronary Artery Disease; Hypotension; Myocardial Infarction; Peripheral Arterial Disease; Phlebitis; Vasculitis Gastrointestinal Complaints and Symptoms: Negative for: Frequent diarrhea; Nausea; Vomiting Medical History: Negative for: Cirrhosis ; Colitis; Crohns; Hepatitis A; Hepatitis B; Hepatitis C Endocrine Complaints and Symptoms: Negative for: Heat/cold intolerance Medical History: Positive for: Type II Diabetes Negative for: Type I Diabetes Time with diabetes: 3 years Treated with: Oral agents, Diet Blood sugar tested every day: No Genitourinary Complaints and Symptoms: Negative for: Frequent urination Medical History: Negative for: End Stage Renal Disease Integumentary (Skin) Complaints and Symptoms: Positive for: Wounds - currently right leg Medical History: Negative for: History of Burn Musculoskeletal Complaints and Symptoms: Negative for: Muscle Pain; Muscle Weakness Medical History: Negative for: Gout; Rheumatoid Arthritis; Osteoarthritis; Osteomyelitis Neurologic Complaints and Symptoms: Negative for: Numbness/parasthesias Medical History: Negative for: Dementia; Neuropathy; Quadriplegia; Paraplegia; Seizure Disorder Psychiatric Complaints and Symptoms: Negative for: Claustrophobia; Suicidal Medical History: Negative for: Anorexia/bulimia; Confinement Anxiety Hematologic/Lymphatic Medical History: Positive for: Lymphedema - right leg Negative for: Anemia; Hemophilia; Human Immunodeficiency Virus; Sickle Cell Disease Immunological Medical History: Negative for: Lupus Erythematosus; Raynauds; Scleroderma Oncologic Medical History: Negative for: Received Chemotherapy; Received Radiation Immunizations Pneumococcal  Vaccine: Received Pneumococcal Vaccination: No Implantable Devices None Hospitalization / Surgery History Type of Hospitalization/Surgery tonsillectomy right knee ligament repair Family and Social History Cancer: No; Diabetes: Yes - Mother; Heart Disease: No; Hereditary Spherocytosis: No; Hypertension: No; Kidney Disease: No; Lung Disease: No; Seizures: No; Stroke: No; Thyroid Problems: No; Tuberculosis: No; Unknown if ever smoked; Marital Status - Single; Alcohol Use: Rarely; Drug Use: No History; Financial Concerns: No; Food, Clothing or Shelter Needs: No; Support System Lacking: No; Transportation Concerns: No Electronic Signature(s) Signed: 09/19/2020 5:25:56 PM By: Baltazar Najjar MD Signed: 09/19/2020 6:16:29 PM By: Shawn Stall Entered By: Shawn Stall on 09/19/2020 15:07:50 -------------------------------------------------------------------------------- SuperBill Details Patient Name: Date of Service: [name], Shawn Oppenheim T. 09/19/2020 Medical Record Number: [id number] Patient Account Number: [id number] Date of Birth/Sex: Treating RN: 03/23/1980 (40 y.o. [name], [name] Primary Care Provider: Marcy [name] Other Clinician: Referring Provider: Treating Provider/Extender: [name] in Treatment: 0 Diagnosis Coding ICD-10 Codes Code Description [phone] Chronic venous hypertension (idiopathic) with ulcer and inflammation of right lower extremity I89.0 Lymphedema, not elsewhere classified L97.818 Non-pressure chronic ulcer of other part of right lower leg with other specified severity Facility Procedures CPT4 Code: [id number] Description: [phone] - WOUND CARE VISIT-LEV 5 EST PT Modifier: Quantity: 1 Physician Procedures : CPT4 Code Description Modifier [id number] - WC PHYS LEVEL 4 - EST PT ICD-10 Diagnosis Description I87.331 Chronic venous hypertension (idiopathic) with ulcer and inflammation of right lower extremity I89.0 Lymphedema, not  elsewhere classified  L97.818 Non-pressure chronic ulcer of other part of right lower leg with other specified severity Quantity: 1 Electronic Signature(s) Signed: 09/19/2020 5:25:56 PM By: Baltazar Najjar MD Entered By: Baltazar Najjar on 09/19/2020 16:56:03

## 2020-09-19 NOTE — Progress Notes (Signed)
Kerins, Faizan T ([id number]) . Visit Report for 09/19/2020 Abuse/Suicide Risk Screen Details Patient Name: Date of Service: [name] 09/19/2020 2:45 PM Medical Record Number: [id number] Patient Account Number: [id number] Date of Birth/Sex: Treating RN: [birth year] (40 y.o. [name] Primary Care Kalel Harty: Marcy Siren Other Clinician: Referring Zhana Jeangilles: Treating Eyleen Rawlinson/Extender: [name] in Treatment: 0 Abuse/Suicide Risk Screen Items Answer ABUSE RISK SCREEN: Has anyone close to you tried to hurt or harm you recentlyo No Do you feel uncomfortable with anyone in your familyo No Has anyone forced you do things that you didnt want to doo No Electronic Signature(s) Signed: 09/19/2020 6:16:29 PM By: Shawn Stall Entered By: Shawn Stall on 09/19/2020 15:08:17 -------------------------------------------------------------------------------- Activities of Daily Living Details Patient Name: Date of Service: [name] 09/19/2020 2:45 PM Medical Record Number: [id number] Patient Account Number: [id number] Date of Birth/Sex: Treating RN: 29-Mar-1980 (40 y.o. [name] Primary Care Smt Lokey: Marcy Siren Other Clinician: Referring Ruthella Kirchman: Treating Keayra Graham/Extender: [name] in Treatment: 0 Activities of Daily Living Items Answer Activities of Daily Living (Please select one for each item) Drive Automobile Completely Able T Medications ake Completely Able Use T elephone Completely Able Care for Appearance Completely Able Use T oilet Completely Able Bath / Shower Completely Able Dress Self Completely Able Feed Self Completely Able Walk Completely Able Get In / Out Bed Completely Able Housework Completely Able Prepare Meals Completely Able Handle Money Completely Able Shop for Self Completely Able Electronic Signature(s) Signed: 09/19/2020 6:16:29 PM By: Shawn Stall Entered By: Shawn Stall on 09/19/2020 15:08:33 -------------------------------------------------------------------------------- Education Screening Details Patient Name: Date of Service: [name], [name] 09/19/2020 2:45 PM Medical Record Number: [id number] Patient Account Number: [id number] Date of Birth/Sex: Treating RN: September 08, 1980 (40 y.o. [name] Primary Care Amitai Delaughter: Marcy Siren Other Clinician: Referring Kayd Launer: Treating Laycee Fitzsimmons/Extender: [name] in Treatment: 0 Primary Learner Assessed: Patient Learning Preferences/Education Level/Primary Language Learning Preference: Explanation, Demonstration, Printed Material Highest Education Level: College or Above Preferred Language: English Cognitive Barrier Language Barrier: No Translator Needed: No Memory Deficit: No Emotional Barrier: No Cultural/Religious Beliefs Affecting Medical Care: No Physical Barrier Impaired Vision: No Impaired Hearing: No Decreased Hand dexterity: No Knowledge/Comprehension Knowledge Level: High Comprehension Level: High Ability to understand written instructions: High Ability to understand verbal instructions: High Motivation Anxiety Level: Calm Cooperation: Cooperative Education Importance: Acknowledges Need Interest in Health Problems: Asks Questions Perception: Coherent Willingness to Engage in Self-Management High Activities: Readiness to Engage in Self-Management High Activities: Electronic Signature(s) Signed: 09/19/2020 6:16:29 PM By: Shawn Stall Entered By: Shawn Stall on 09/19/2020 15:08:55 -------------------------------------------------------------------------------- Fall Risk Assessment Details Patient Name: Date of Service: [name], [name] 09/19/2020 2:45 PM Medical Record Number: [id number] Patient Account Number: [id number] Date of Birth/Sex: Treating RN: March 04, 1981 (40 y.o. Harlon Flor, Millard.Loa Primary Care  Marielis Samara: Marcy Siren Other Clinician: Referring Payson Evrard: Treating Patrcia Schnepp/Extender: [name] in Treatment: 0 Fall Risk Assessment Items Have you had 2 or more falls in the last 12 monthso 0 No Have you had any fall that resulted in injury in the last 12 monthso 0 No FALLS RISK SCREEN History of falling - immediate or within 3 months 0 No Secondary diagnosis (Do you have 2 or more medical diagnoseso) 0 No Ambulatory aid None/bed rest/wheelchair/nurse 0 Yes Crutches/cane/walker 0 No Furniture 0 No Intravenous therapy Access/Saline/Heparin Lock 0 No Gait/Transferring Normal/ bed rest/ wheelchair 0 Yes  Weak (short steps with or without shuffle, stooped but able to lift head while walking, may seek 0 No support from furniture) Impaired (short steps with shuffle, may have difficulty arising from chair, head down, impaired 0 No balance) Mental Status Oriented to own ability 0 Yes Electronic Signature(s) Signed: 09/19/2020 6:16:29 PM By: Shawn Stall Entered By: Shawn Stall on 09/19/2020 15:09:02 -------------------------------------------------------------------------------- Foot Assessment Details Patient Name: Date of Service: [name] 09/19/2020 2:45 PM Medical Record Number: [id number] Patient Account Number: [id number] Date of Birth/Sex: Treating RN: [birth year] (40 y.o. [name] Primary Care Murphy Bundick: Marcy Siren Other Clinician: Referring Mende Biswell: Treating Gennett Garcia/Extender: [name] in Treatment: 0 Foot Assessment Items Site Locations + = Sensation present, - = Sensation absent, C = Callus, U = Ulcer R = Redness, W = Warmth, M = Maceration, PU = Pre-ulcerative lesion F = Fissure, S = Swelling, D = Dryness Assessment Right: Left: Other Deformity: No No Prior Foot Ulcer: No No Prior Amputation: No No Charcot Joint: No No Ambulatory Status: Ambulatory Without  Help Gait: Steady Electronic Signature(s) Signed: 09/19/2020 6:16:29 PM By: Shawn Stall Entered By: Shawn Stall on 09/19/2020 15:19:33 -------------------------------------------------------------------------------- Nutrition Risk Screening Details Patient Name: Date of Service: [name] 09/19/2020 2:45 PM Medical Record Number: [id number] Patient Account Number: [id number] Date of Birth/Sex: Treating RN: [birth year] (40 y.o. Harlon Flor, Millard.Loa Primary Care Rosamary Boudreau: Marcy Siren Other Clinician: Referring Tylee Newby: Treating Wren Gallaga/Extender: [name] in Treatment: 0 Height (in): 74 Weight (lbs): 425 Body Mass Index (BMI): 54.6 Nutrition Risk Screening Items Score Screening NUTRITION RISK SCREEN: I have an illness or condition that made me change the kind and/or amount of food I eat 2 Yes I eat fewer than two meals per day 0 No I eat few fruits and vegetables, or milk products 0 No I have three or more drinks of beer, liquor or wine almost every day 0 No I have tooth or mouth problems that make it hard for me to eat 0 No I don't always have enough money to buy the food I need 0 No I eat alone most of the time 0 No I take three or more different prescribed or over-the-counter drugs a day 1 Yes Without wanting to, I have lost or gained 10 pounds in the last six months 0 No I am not always physically able to shop, cook and/or feed myself 0 No Nutrition Protocols Good Risk Protocol Provide education on elevated blood Moderate Risk Protocol 0 sugars and impact on wound healing, as applicable High Risk Proctocol Risk Level: Moderate Risk Score: 3 Electronic Signature(s) Signed: 09/19/2020 6:16:29 PM By: Shawn Stall Entered By: Shawn Stall on 09/19/2020 15:09:11

## 2020-09-25 ENCOUNTER — Telehealth: Payer: Self-pay | Admitting: Internal Medicine

## 2020-09-25 NOTE — Progress Notes (Signed)
Anna, Dakwon T ([id number]) . Visit Report for 09/19/2020 Allergy List Details Patient Name: Date of Service: Pierce Crane 09/19/2020 2:45 PM Medical Record Number: [id number] Patient Account Number: [id number] Date of Birth/Sex: Treating RN: January 09, 1981 (40 y.o. [name] Primary Care Trevontae Lindahl: Marcy Siren Other Clinician: Referring Riaan Toledo: Treating Mishka Stegemann/Extender: [name] in Treatment: 0 Allergies Active Allergies peanut Type: Food lidocaine Reaction: burns Allergy Notes Electronic Signature(s) Signed: 09/19/2020 6:16:29 PM By: Shawn Stall Entered By: Shawn Stall on 09/19/2020 15:31:08 -------------------------------------------------------------------------------- Arrival Information Details Patient Name: Date of Service: [name], Shawn Oppenheim T. 09/19/2020 2:45 PM Medical Record Number: [id number] Patient Account Number: [id number] Date of Birth/Sex: Treating RN: 1980/08/06 (40 y.o. Harlon Flor, Millard.Loa Primary Care Cheralyn Oliver: Marcy Siren Other Clinician: Referring Akaylah Lalley: Treating Shanette Tamargo/Extender: [name] in Treatment: 0 Visit Information Patient Arrived: Ambulatory Arrival Time: 15:00 Accompanied By: self Transfer Assistance: None Patient Identification Verified: Yes Secondary Verification Process Completed: Yes Patient Requires Transmission-Based Precautions: No Patient Has Alerts: No History Since Last Visit Added or deleted any medications: No Any new allergies or adverse reactions: No Had a fall or experienced change in activities of daily living that may affect risk of falls: No Signs or symptoms of abuse/neglect since last visito No Hospitalized since last visit: No Implantable device outside of the clinic excluding cellular tissue based products placed in the center since last visit: No Electronic Signature(s) Signed: 09/19/2020 6:16:29 PM By: Shawn Stall Entered  By: Shawn Stall on 09/19/2020 15:03:00 -------------------------------------------------------------------------------- Clinic Level of Care Assessment Details Patient Name: Date of Service: Pierce Crane 09/19/2020 2:45 PM Medical Record Number: [id number] Patient Account Number: [id number] Date of Birth/Sex: Treating RN: [birth year] (40 y.o. [name], [name] Primary Care Pratyush Ammon: Marcy Siren Other Clinician: Referring Tanielle Emigh: Treating Areesha Dehaven/Extender: [name] in Treatment: 0 Clinic Level of Care Assessment Items TOOL 3 Quantity Score X- 1 0 Use when EandM and Procedure is performed on FOLLOW-UP visit ASSESSMENTS - Nursing Assessment / Reassessment X- 1 10 Reassessment of Co-morbidities (includes updates in patient status) X- 1 5 Reassessment of Adherence to Treatment Plan ASSESSMENTS - Wound and Skin Assessment / Reassessment []  - Points for Wound Assessment can only be taken for a new wound of unknown or different etiology and a procedure is 0 NOT performed to that wound []  - 0 Simple Wound Assessment / Reassessment - one wound X- 2 5 Complex Wound Assessment / Reassessment - multiple wounds X- 1 10 Dermatologic / Skin Assessment (not related to wound area) ASSESSMENTS - Focused Assessment X- 1 5 Circumferential Edema Measurements - multi extremities X- 1 10 Nutritional Assessment / Counseling / Intervention []  - 0 Lower Extremity Assessment (monofilament, tuning fork, pulses) []  - 0 Peripheral Arterial Disease Assessment (using hand held doppler) ASSESSMENTS - Ostomy and/or Continence Assessment and Care []  - 0 Incontinence Assessment and Management []  - 0 Ostomy Care Assessment and Management (repouching, etc.) PROCESS - Coordination of Care []  - Points for Discharge Coordination can only be taken for a new wound of unknown or different etiology and a procedure 0 is NOT performed to that wound []  - 0 Simple  Patient / Family Education for ongoing care X- 1 20 Complex (extensive) Patient / Family Education for ongoing care X- 1 10 Staff obtains , Records, T Results / Process Orders est []  - 0 Staff telephones HHA, Nursing Homes / Clarify orders / etc []  - 0 Routine Transfer  to another Facility (non-emergent condition)  - 0 Routine Hospital Admission (non-emergent condition) X- 1 15 New Admissions / Manufacturing engineer / Ordering NPWT Apligraf, etc. ,  - 0 Emergency Hospital Admission (emergent condition)  - 0 Simple Discharge Coordination X- 1 15 Complex (extensive) Discharge Coordination PROCESS - Special Needs  - 0 Pediatric / Minor Patient Management  - 0 Isolation Patient Management  - 0 Hearing / Language / Visual special needs  - 0 Assessment of Community assistance (transportation, D/C planning, etc.)  - 0 Additional assistance / Altered mentation  - 0 Support Surface(s) Assessment (bed, cushion, seat, etc.) INTERVENTIONS - Wound Cleansing / Measurement  - Points for Wound Cleaning / Measurement, Wound Dressing, Specimen Collection and Specimen taken to lab can only 0 be taken for a new wound of unknown or different etiology and a procedure is NOT performed to that wound  - 0 Simple Wound Cleansing - one wound X- 2 5 Complex Wound Cleansing - multiple wounds X- 1 5 Wound Imaging (photographs - any number of wounds)  - 0 Wound Tracing (instead of photographs)  - 0 Simple Wound Measurement - one wound X- 2 5 Complex Wound Measurement - multiple wounds INTERVENTIONS - Wound Dressings  - 0 Small Wound Dressing one or multiple wounds  - 0 Medium Wound Dressing one or multiple wounds X- 2 20 Large Wound Dressing one or multiple wounds INTERVENTIONS - Miscellaneous  - 0 External ear exam  - 0 Specimen Collection (cultures, biopsies, blood, body fluids, etc.)  - 0 Specimen(s) / Culture(s) sent or taken to Lab  for analysis  - 0 Patient Transfer (multiple staff / Nurse, adult / Similar devices)  - 0 Simple Staple / Suture removal (25 or less)  - 0 Complex Staple / Suture removal (26 or more)  - 0 Hypo / Hyperglycemic Management (close monitor of Blood Glucose)  - 0 Ankle / Brachial Index (ABI) - do not check if billed separately X- 1 5 Vital Signs Has the patient been seen at the hospital within the last three years: Yes Total Score: 180 Level Of Care: New/Established - Level 5 Electronic Signature(s) Signed: 09/20/2020 6:12:45 PM By: Fonnie Mu RN Entered By: Fonnie Mu on 09/19/2020 16:05:27 -------------------------------------------------------------------------------- Compression Therapy Details Patient Name: Date of Service: [name], Shawn Oppenheim T. 09/19/2020 2:45 PM Medical Record Number: [id number] Patient Account Number: [id number] Date of Birth/Sex: Treating RN: [birth year] (40 y.o. [name], [name] Primary Care Jamillia Closson: Marcy Siren Other Clinician: Referring Lido Maske: Treating Janaisa Birkland/Extender: [name] in Treatment: 0 Compression Therapy Performed for Wound Assessment: Wound #32 Right,Medial Lower Leg Performed By: Clinician Fonnie Mu, RN Compression Type: Four Layer Post Procedure Diagnosis Same as Pre-procedure Electronic Signature(s) Signed: 09/20/2020 6:12:45 PM By: Fonnie Mu RN Entered By: Fonnie Mu on 09/19/2020 16:03:13 -------------------------------------------------------------------------------- Compression Therapy Details Patient Name: Date of Service: [name] 09/19/2020 2:45 PM Medical Record Number: [id number] Patient Account Number: [id number] Date of Birth/Sex: Treating RN: 19-Dec-1980 (40 y.o. [name], [name] Primary Care Sherita Decoste: Marcy Siren Other Clinician: Referring Taygan Connell: Treating Gennie Dib/Extender: [name] in Treatment: 0 Compression Therapy Performed for Wound Assessment: Wound #33 Right,Lateral Lower Leg Performed By: Clinician Fonnie Mu, RN Compression Type: Four Layer Post Procedure Diagnosis Same as Pre-procedure Electronic Signature(s) Signed: 09/20/2020 6:12:45 PM By: Fonnie Mu RN Entered By: Fonnie Mu on 09/19/2020 16:03:14 -------------------------------------------------------------------------------- Encounter Discharge Information Details Patient Name: Date of Service: [name], [name] 09/19/2020  2:45 PM Medical Record Number: [id number] Patient Account Number: [id number] Date of Birth/Sex: Treating RN: Dec 29, 1980 (40 y.o. Lytle Michaels Primary Care Annisha Baar: Marcy Siren Other Clinician: Referring Nello Corro: Treating Kaslyn Richburg/Extender: [name] in Treatment: 0 Encounter Discharge Information Items Discharge Condition: Stable Ambulatory Status: Ambulatory Discharge Destination: Home Transportation: Private Auto Schedule Follow-up Appointment: Yes Clinical Summary of Care: Provided on 09/19/2020 Form Type Recipient Paper Patient Patient Electronic Signature(s) Signed: 09/19/2020 4:34:55 PM By: Antonieta Iba Entered By: Antonieta Iba on 09/19/2020 16:34:55 -------------------------------------------------------------------------------- Lower Extremity Assessment Details Patient Name: Date of Service: [name], Shawn Oppenheim T. 09/19/2020 2:45 PM Medical Record Number: [id number] Patient Account Number: [id number] Date of Birth/Sex: Treating RN: Aug 17, 1980 (40 y.o. [name] Primary Care Allexus Ovens: Other Clinician: Marcy Siren Referring Magic Mohler: Treating Mckenley Birenbaum/Extender: [name] in Treatment: 0 Edema Assessment Assessed: Kyra Searles: No] Franne Forts: Yes] Edema: [Left: Ye] [Right: s] Calf Left: Right: Point of Measurement: 36 cm From Medial Instep  63.5 cm Ankle Left: Right: Point of Measurement: 10 cm From Medial Instep 34 cm Knee To Floor Left: Right: From Medial Instep 47 cm Vascular Assessment Pulses: Dorsalis Pedis Palpable: [Right:Yes] Doppler Audible: [Right:Yes] Posterior Tibial Palpable: [Right:No] Doppler Audible: [Right:Inaudible] Blood Pressure: Brachial: [Right:152] Ankle: [Right:Dorsalis Pedis: 208 1.37] Electronic Signature(s) Signed: 09/19/2020 6:16:29 PM By: Shawn Stall Entered By: Shawn Stall on 09/19/2020 15:27:02 -------------------------------------------------------------------------------- Multi Wound Chart Details Patient Name: Date of Service: [name], Shawn Oppenheim T. 09/19/2020 2:45 PM Medical Record Number: [id number] Patient Account Number: [id number] Date of Birth/Sex: Treating RN: 22-Feb-1981 (40 y.o. [name], [name] Primary Care Shakima Nisley: Marcy Siren Other Clinician: Referring Khalis Hittle: Treating Sinia Antosh/Extender: [name] in Treatment: 0 Vital Signs Height(in): 74 Pulse(bpm): 86 Weight(lbs): 425 Blood Pressure(mmHg): 152/113 Body Mass Index(BMI): 55 Temperature(F): 98.1 Respiratory Rate(breaths/min): 22 Photos: [32:No Photos Right, Medial Lower Leg] [33:No Photos Right, Lateral Lower Leg] [N/A:N/A N/A] Wound Location: [32:Gradually Appeared] [33:Gradually Appeared] [N/A:N/A] Wounding Event: [32:Lymphedema] [33:Lymphedema] [N/A:N/A] Primary Etiology: [32:Lymphedema, Deep Vein Thrombosis,] [33:Lymphedema, Deep Vein Thrombosis,] [N/A:N/A] Comorbid History: [32:Hypertension, Peripheral Venous Disease, Type II Diabetes 07/11/2020] [33:Hypertension, Peripheral Venous Disease, Type II Diabetes 07/10/2020] [N/A:N/A] Date Acquired: [32:0] [33:0] [N/A:N/A] Weeks of Treatment: [32:Open] [33:Open] [N/A:N/A] Wound Status: [32:No] [33:Yes] [N/A:N/A] Clustered Wound: [32:N/A] [33:4] [N/A:N/A] Clustered Quantity: [32:5.5x25x0.4] [33:2.5x2.5x0.1]  [N/A:N/A] Measurements L x W x D (cm) [32:107.992] [33:4.909] [N/A:N/A] A (cm) : rea [32:43.197] [33:0.491] [N/A:N/A] Volume (cm) : [32:Full Thickness Without Exposed] [33:Full Thickness Without Exposed] [N/A:N/A] Classification: [32:Support Structures Large] [33:Support Structures Medium] [N/A:N/A] Exudate Amount: [32:Serosanguineous] [33:Serosanguineous] [N/A:N/A] Exudate Type: [32:red, brown] [33:red, brown] [N/A:N/A] Exudate Color: [32:Distinct, outline attached] [33:Distinct, outline attached] [N/A:N/A] Wound Margin: [32:Small (1-33%)] [33:Large (67-100%)] [N/A:N/A] Granulation Amount: [32:Red] [33:Red] [N/A:N/A] Granulation Quality: [32:Large (67-100%)] [33:None Present (0%)] [N/A:N/A] Necrotic Amount: [32:Fat Layer (Subcutaneous Tissue): Yes Fat Layer (Subcutaneous Tissue): Yes N/A] Exposed Structures: [32:Fascia: No Tendon: No Muscle: No Joint: No Bone: No None] [33:Fascia: No Tendon: No Muscle: No Joint: No Bone: No Small (1-33%)] [N/A:N/A] Epithelialization: [32:Compression Therapy] [33:Compression Therapy] [N/A:N/A] Treatment Notes Wound #32 (Lower Leg) Wound Laterality: Right, Medial Cleanser Soap and Water Discharge Instruction: May shower and wash wound with dial antibacterial soap and water prior to dressing change. Wound Cleanser Discharge Instruction: Cleanse the wound with wound cleanser prior to applying a clean dressing using gauze sponges, not tissue or cotton balls. Peri-Wound Care Triamcinolone 15 (g) Discharge Instruction: Use triamcinolone 15 (g) as directed Sween Lotion (Moisturizing lotion) Discharge Instruction: Apply moisturizing lotion as directed  Topical Primary Dressing KerraCel Ag Gelling Fiber Dressing, 4x5 in (silver alginate) Discharge Instruction: Apply silver alginate to wound bed as instructed Secondary Dressing Woven Gauze Sponge, Non-Sterile 4x4 in Discharge Instruction: Apply over primary dressing as directed. ABD Pad, 5x9 Discharge  Instruction: Apply over primary dressing as directed. Zetuvit Plus 4x8 in Discharge Instruction: Apply over primary dressing as directed. Secured With Compression Wrap FourPress (4 layer compression wrap) Discharge Instruction: Apply four layer compression as directed. May also use Miliken CoFlex 2 layer compression system as alternative. Compression Stockings Add-Ons Wound #33 (Lower Leg) Wound Laterality: Right, Lateral Cleanser Soap and Water Discharge Instruction: May shower and wash wound with dial antibacterial soap and water prior to dressing change. Wound Cleanser Discharge Instruction: Cleanse the wound with wound cleanser prior to applying a clean dressing using gauze sponges, not tissue or cotton balls. Peri-Wound Care Triamcinolone 15 (g) Discharge Instruction: Use triamcinolone 15 (g) as directed Sween Lotion (Moisturizing lotion) Discharge Instruction: Apply moisturizing lotion as directed Topical Primary Dressing KerraCel Ag Gelling Fiber Dressing, 4x5 in (silver alginate) Discharge Instruction: Apply silver alginate to wound bed as instructed Secondary Dressing Woven Gauze Sponge, Non-Sterile 4x4 in Discharge Instruction: Apply over primary dressing as directed. ABD Pad, 5x9 Discharge Instruction: Apply over primary dressing as directed. Zetuvit Plus 4x8 in Discharge Instruction: Apply over primary dressing as directed. Secured With Compression Wrap FourPress (4 layer compression wrap) Discharge Instruction: Apply four layer compression as directed. May also use Miliken CoFlex 2 layer compression system as alternative. Compression Stockings Add-Ons Electronic Signature(s) Signed: 09/19/2020 5:25:56 PM By: Baltazar Najjarobson, Michael MD Signed: 09/20/2020 6:12:45 PM By: Fonnie MuBreedlove, [name] RN Entered By: Baltazar Najjarobson, Michael on 09/19/2020 16:41:57 -------------------------------------------------------------------------------- Multi-Disciplinary Care Plan Details Patient  Name: Date of Service: Shawn GongLO NG, Shawn OppenheimA DRIA N T. 09/19/2020 2:45 PM Medical Record Number: [id number] Patient Account Number: [id number] Date of Birth/Sex: Treating RN: 07/30/1980 (40 y.o. Charlean MerlM) Breedlove, [name] Primary Care Marshal Eskew: Marcy SirenWallace, Catherine Other Clinician: Referring Evalyse Stroope: Treating Eugen Jeansonne/Extender: Mikki Harborobson, Michael Wallace, Catherine Weeks in Treatment: 0 Active Inactive Orientation to the Wound Care Program Nursing Diagnoses: Knowledge deficit related to the wound healing center program Goals: Patient/caregiver will verbalize understanding of the Wound Healing Center Program Date Initiated: 09/19/2020 Target Resolution Date: 10/12/2020 Goal Status: Active Interventions: Provide education on orientation to the wound center Notes: Wound/Skin Impairment Nursing Diagnoses: Impaired tissue integrity Knowledge deficit related to ulceration/compromised skin integrity Goals: Patient will have a decrease in wound volume by X% from date: (specify in notes) Date Initiated: 09/19/2020 Target Resolution Date: 10/12/2020 Goal Status: Active Patient/caregiver will verbalize understanding of skin care regimen Date Initiated: 09/19/2020 Target Resolution Date: 12/12/2020 Goal Status: Active Ulcer/skin breakdown will have a volume reduction of 30% by week 4 Date Initiated: 09/19/2020 Target Resolution Date: 10/11/2020 Goal Status: Active Ulcer/skin breakdown will have a volume reduction of 50% by week 8 Date Initiated: 09/19/2020 Target Resolution Date: 10/08/2020 Goal Status: Active Interventions: Assess patient/caregiver ability to obtain necessary supplies Assess patient/caregiver ability to perform ulcer/skin care regimen upon admission and as needed Assess ulceration(s) every visit Notes: Electronic Signature(s) Signed: 09/20/2020 6:12:45 PM By: Fonnie MuBreedlove, [name] RN Entered By: Fonnie MuBreedlove, [name] on 09/19/2020  16:04:10 -------------------------------------------------------------------------------- Pain Assessment Details Patient Name: Date of Service: Laurena BeringLO NG, [name] 09/19/2020 2:45 PM Medical Record Number: [id number] Patient Account Number: [id number] Date of Birth/Sex: Treating RN: 07/30/1980 (40 y.o. Tammy SoursM) Deaton, Bobbi Primary Care Kailan Laws: Marcy SirenWallace, Catherine Other Clinician: Referring Maribella Kuna: Treating Ashford Clouse/Extender: Mikki Harborobson, Michael Wallace, Catherine Weeks in Treatment: 0 Active Problems Location  of Pain Severity and Description of Pain Patient Has Paino No Site Locations Rate the pain. Current Pain Level: 0 Pain Management and Medication Current Pain Management: Medication: No Cold Application: No Rest: No Massage: No Activity: No SerranoE.N.S.: No Heat Application: No Leg drop or elevation: No Is the Current Pain Management Adequate: Adequate How does your wound impact your activities of daily livingo Sleep: No Bathing: No Appetite: No Relationship With Others: No Bladder Continence: No Emotions: No Bowel Continence: No Work: No Toileting: No Drive: No Dressing: No Hobbies: No Notes Pain only at night. Electronic Signature(s) Signed: 09/19/2020 6:16:29 PM By: Shawn Stall Entered By: Shawn Stall on 09/19/2020 15:09:34 -------------------------------------------------------------------------------- Patient/Caregiver Education Details Patient Name: Date of Service: Pierce Crane 7/12/2022andnbsp2:45 PM Medical Record Number: [id number] Patient Account Number: [id number] Date of Birth/Gender: Treating RN: [birth year] (40 y.o. [name] Primary Care Physician: Marcy Siren Other Clinician: Referring Physician: Treating Physician/Extender: [name] in Treatment: 0 Education Assessment Education Provided To: Patient Education Topics Provided Welcome T [facility]: o Methods:  Explain/Verbal Responses: State content correctly Electronic Signature(s) Signed: 09/20/2020 6:12:45 PM By: Fonnie Mu RN Entered By: Fonnie Mu on 09/19/2020 16:04:24 -------------------------------------------------------------------------------- Wound Assessment Details Patient Name: Date of Service: [name], Shawn Oppenheim T. 09/19/2020 2:45 PM Medical Record Number: [id number] Patient Account Number: [id number] Date of Birth/Sex: Treating RN: 13-Dec-1980 (40 y.o. [name] Primary Care Reyaan Thoma: Marcy Siren Other Clinician: Referring Wilba Mutz: Treating Gissele Narducci/Extender: [name] in Treatment: 0 Wound Status Wound Number: 32 Primary Lymphedema Etiology: Wound Location: Right, Medial Lower Leg Wound Open Wounding Event: Gradually Appeared Status: Date Acquired: 07/11/2020 Comorbid Lymphedema, Deep Vein Thrombosis, Hypertension, Peripheral Weeks Of Treatment: 0 History: Venous Disease, Type II Diabetes Clustered Wound: No Photos Wound Measurements Length: (cm) 5.5 Width: (cm) 25 Depth: (cm) 0.4 Area: (cm) 107.992 Volume: (cm) 43.197 % Reduction in Area: 0% % Reduction in Volume: 0% Epithelialization: None Tunneling: No Undermining: No Wound Description Classification: Full Thickness Without Exposed Support Structures Wound Margin: Distinct, outline attached Exudate Amount: Large Exudate Type: Serosanguineous Exudate Color: red, brown Foul Odor After Cleansing: No Slough/Fibrino Yes Wound Bed Granulation Amount: Small (1-33%) Exposed Structure Granulation Quality: Red Fascia Exposed: No Necrotic Amount: Large (67-100%) Fat Layer (Subcutaneous Tissue) Exposed: Yes Necrotic Quality: Adherent Slough Tendon Exposed: No Muscle Exposed: No Joint Exposed: No Bone Exposed: No Treatment Notes Wound #32 (Lower Leg) Wound Laterality: Right, Medial Cleanser Soap and Water Discharge Instruction: May shower and wash  wound with dial antibacterial soap and water prior to dressing change. Wound Cleanser Discharge Instruction: Cleanse the wound with wound cleanser prior to applying a clean dressing using gauze sponges, not tissue or cotton balls. Peri-Wound Care Triamcinolone 15 (g) Discharge Instruction: Use triamcinolone 15 (g) as directed Sween Lotion (Moisturizing lotion) Discharge Instruction: Apply moisturizing lotion as directed Topical Primary Dressing KerraCel Ag Gelling Fiber Dressing, 4x5 in (silver alginate) Discharge Instruction: Apply silver alginate to wound bed as instructed Secondary Dressing Woven Gauze Sponge, Non-Sterile 4x4 in Discharge Instruction: Apply over primary dressing as directed. ABD Pad, 5x9 Discharge Instruction: Apply over primary dressing as directed. Zetuvit Plus 4x8 in Discharge Instruction: Apply over primary dressing as directed. Secured With Compression Wrap FourPress (4 layer compression wrap) Discharge Instruction: Apply four layer compression as directed. May also use Miliken CoFlex 2 layer compression system as alternative. Compression Stockings Add-Ons Electronic Signature(s) Signed: 09/19/2020 6:16:29 PM By: Shawn Stall Signed: 09/25/2020 3:49:49 PM By:  Dawkins, Destiny Entered By: Karl Ito on 09/19/2020 17:04:02 -------------------------------------------------------------------------------- Wound Assessment Details Patient Name: Date of Service: Pierce Crane 09/19/2020 2:45 PM Medical Record Number: [id number] Patient Account Number: [id number] Date of Birth/Sex: Treating RN: [birth year] (40 y.o. Harlon Flor, Millard.Loa Primary Care Jermari Tamargo: Marcy Siren Other Clinician: Referring Ota Ebersole: Treating Darrin Koman/Extender: [name] in Treatment: 0 Wound Status Wound Number: 33 Primary Lymphedema Etiology: Wound Location: Right, Lateral Lower Leg Wound Open Wounding Event: Gradually  Appeared Status: Date Acquired: 07/10/2020 Comorbid Lymphedema, Deep Vein Thrombosis, Hypertension, Peripheral Weeks Of Treatment: 0 History: Venous Disease, Type II Diabetes Clustered Wound: Yes Photos Wound Measurements Length: (cm) 2.5 Width: (cm) 2.5 Depth: (cm) 0.1 Clustered Quantity: 4 Area: (cm) 4.909 Volume: (cm) 0.491 % Reduction in Area: 0% % Reduction in Volume: 0% Epithelialization: Small (1-33%) Tunneling: No Undermining: No Wound Description Classification: Full Thickness Without Exposed Support Structures Wound Margin: Distinct, outline attached Exudate Amount: Medium Exudate Type: Serosanguineous Exudate Color: red, brown Foul Odor After Cleansing: No Slough/Fibrino No Wound Bed Granulation Amount: Large (67-100%) Exposed Structure Granulation Quality: Red Fascia Exposed: No Necrotic Amount: None Present (0%) Fat Layer (Subcutaneous Tissue) Exposed: Yes Tendon Exposed: No Muscle Exposed: No Joint Exposed: No Bone Exposed: No Treatment Notes Wound #33 (Lower Leg) Wound Laterality: Right, Lateral Cleanser Soap and Water Discharge Instruction: May shower and wash wound with dial antibacterial soap and water prior to dressing change. Wound Cleanser Discharge Instruction: Cleanse the wound with wound cleanser prior to applying a clean dressing using gauze sponges, not tissue or cotton balls. Peri-Wound Care Triamcinolone 15 (g) Discharge Instruction: Use triamcinolone 15 (g) as directed Sween Lotion (Moisturizing lotion) Discharge Instruction: Apply moisturizing lotion as directed Topical Primary Dressing KerraCel Ag Gelling Fiber Dressing, 4x5 in (silver alginate) Discharge Instruction: Apply silver alginate to wound bed as instructed Secondary Dressing Woven Gauze Sponge, Non-Sterile 4x4 in Discharge Instruction: Apply over primary dressing as directed. ABD Pad, 5x9 Discharge Instruction: Apply over primary dressing as directed. Zetuvit Plus 4x8  in Discharge Instruction: Apply over primary dressing as directed. Secured With Compression Wrap FourPress (4 layer compression wrap) Discharge Instruction: Apply four layer compression as directed. May also use Miliken CoFlex 2 layer compression system as alternative. Compression Stockings Add-Ons Electronic Signature(s) Signed: 09/19/2020 6:16:29 PM By: Shawn Stall Signed: 09/25/2020 3:49:49 PM By: Karl Ito Entered By: Karl Ito on 09/19/2020 17:07:09 -------------------------------------------------------------------------------- Vitals Details Patient Name: Date of Service: [name], A DRIA Dorris Carnes T. 09/19/2020 2:45 PM Medical Record Number: [id number] Patient Account Number: [id number] Date of Birth/Sex: Treating RN: 1980-08-12 (40 y.o. Harlon Flor, Millard.Loa Primary [facility] Mialee Weyman: Marcy Siren Other Clinician: Referring Orpha Dain: Treating Ayahna Solazzo/Extender: [name] in Treatment: 0 Vital Signs Time Taken: 15:00 Temperature (F): 98.1 Height (in): 74 Pulse (bpm): 86 Source: Stated Respiratory Rate (breaths/min): 22 Weight (lbs): 425 Blood Pressure (mmHg): 152/113 Source: Stated Reference Range: 80 - 120 mg / dl Body Mass Index (BMI): 54.6 Electronic Signature(s) Signed: 09/19/2020 6:16:29 PM By: Shawn Stall Entered By: Shawn Stall on 09/19/2020 15:08:08

## 2020-09-25 NOTE — Telephone Encounter (Signed)
Patient called in asking for an order for a new mask and hoses for his CPAP machine.

## 2020-09-26 ENCOUNTER — Other Ambulatory Visit: Payer: Self-pay

## 2020-09-26 ENCOUNTER — Encounter (HOSPITAL_BASED_OUTPATIENT_CLINIC_OR_DEPARTMENT_OTHER): Payer: 59 | Admitting: Internal Medicine

## 2020-09-26 DIAGNOSIS — I87331 Chronic venous hypertension (idiopathic) with ulcer and inflammation of right lower extremity: Secondary | ICD-10-CM | POA: Diagnosis not present

## 2020-09-26 NOTE — Progress Notes (Addendum)
[name], Shawn T ([id number]) . Visit Report for 09/26/2020 Debridement Details Patient Name: Date of Service: [name] 09/26/2020 2:15 PM Medical Record Number: [id number] Patient Account Number: [id number] Date of Birth/Sex: Treating RN: 24-Jun-1980 (40 y.o. [name], [name] Primary Care Provider: Marcy [name] Other Clinician: Referring Provider: Treating Provider/Extender: [name] in Treatment: 1 Debridement Performed for Assessment: Wound #33 Right,Lateral Lower Leg Performed By: Physician Shawn Caul., MD Debridement Type: Debridement Level of Consciousness (Pre-procedure): Awake and Alert Pre-procedure Verification/Time Out Yes - 15:11 Taken: Start Time: 15:11 Pain Control: Lidocaine T Area Debrided (L x W): otal 0.4 (cm) x 0.4 (cm) = 0.16 (cm) Tissue and other material debrided: Viable, Non-Viable, Slough, Subcutaneous, Skin: Dermis , Skin: Epidermis, Slough Level: Skin/Subcutaneous Tissue Debridement Description: Excisional Instrument: Curette Bleeding: Minimum Hemostasis Achieved: Pressure End Time: 15:12 Procedural Pain: 0 Post Procedural Pain: 0 Response to Treatment: Procedure was tolerated well Level of Consciousness (Post- Awake and Alert procedure): Post Debridement Measurements of Total Wound Length: (cm) 0.4 Width: (cm) 0.4 Depth: (cm) 0.1 Volume: (cm) 0.013 Character of Wound/Ulcer Post Debridement: Improved Post Procedure Diagnosis Same as Pre-procedure Electronic Signature(s) Signed: 09/26/2020 5:24:30 PM By: Shawn Najjar MD Signed: 09/26/2020 5:32:12 PM By: Shawn Mu RN Entered By: [name] on 09/26/2020 15:15:55 -------------------------------------------------------------------------------- Debridement Details Patient Name: Date of Service: [name], Shawn Oppenheim T. 09/26/2020 2:15 PM Medical Record Number: [id number] Patient Account Number: [id number] Date of Birth/Sex: Treating  RN: 25-Apr-1980 (40 y.o. [name], [name] Primary Care Provider: Marcy [name] Other Clinician: Referring Provider: Treating Provider/Extender: [name] in Treatment: 1 Debridement Performed for Assessment: Wound #32 Right,Medial Lower Leg Performed By: Physician Shawn Caul., MD Debridement Type: Debridement Level of Consciousness (Pre-procedure): Awake and Alert Pre-procedure Verification/Time Out Yes - 15:10 Taken: Start Time: 15:10 Pain Control: Lidocaine T Area Debrided (L x W): otal 5.4 (cm) x 5 (cm) = 27 (cm) Tissue and other material debrided: Viable, Non-Viable, Slough, Subcutaneous, Skin: Dermis , Skin: Epidermis, Slough Level: Skin/Subcutaneous Tissue Debridement Description: Excisional Instrument: Curette Bleeding: Minimum Hemostasis Achieved: Pressure End Time: 15:11 Procedural Pain: 0 Post Procedural Pain: 0 Response to Treatment: Procedure was tolerated well Level of Consciousness (Post- Awake and Alert procedure): Post Debridement Measurements of Total Wound Length: (cm) 5.7 Width: (cm) 23 Depth: (cm) 0.2 Volume: (cm) 20.593 Character of Wound/Ulcer Post Debridement: Improved Post Procedure Diagnosis Same as Pre-procedure Electronic Signature(s) Signed: 09/26/2020 5:24:30 PM By: Shawn Najjar MD Signed: 09/26/2020 5:32:12 PM By: Shawn Mu RN Entered By: [name] on 09/26/2020 17:15:32 -------------------------------------------------------------------------------- HPI Details Patient Name: Date of Service: [name], Shawn DRIA N T. 09/26/2020 2:15 PM Medical Record Number: [id number] Patient Account Number: [id number] Date of Birth/Sex: Treating RN: [birth year] (40 y.o. [name] Primary Care Provider: Marcy [name] Other Clinician: Referring Provider: Treating Provider/Extender: [name] in Treatment: 1 History of Present Illness Location:  Patient presents with Shawn wound to right lower leg. Quality: very chronic but clean Duration: over 1 year Modifying Factors: r iliac obs or absence, obesity HPI Description: The patient is Shawn 40 yrs old bm here for evaluation of his right leg ulcer. He has an extensive history of lymphedema and ulcers. He is being treated at the [facility] by Dr. Wiliam [name] with [name] boots and the [facility]. He has pumps at home but he is only using them once Shawn day. 08/30/14 selective debridement done of surface eschar. The wound cleans up quite nicely  in the bed of this looks healthy. He is using Shawn wound VAC under an Unna wrap. 11/17/14; selective debridement done of surface eschar. Once again the wound beds at clean up quite nicely. He is no longer using Shawn wound VAC. Recent dressing changes include Hydrofera Blue. Apparently he has done Shawn wide variety of different topical dressings including advanced treatment options like Apligraf's without success. 03/21/15; surgical debridement done of surface eschar and nonviable subcutaneous tissue. The area on the medial aspect of the right leg has closed and the wound on the lateral right leg looks improved has been using Silver Collagen 04/11/15; I think attendance here is sporadic. The area on the right medial leg remains healed. He has Shawn new tiny area on the back of the right leg. Again Shawn surgical debridement of the surface eschar and nonviable subcutaneous tissue of the major wound on the right lateral leg. He has been using Silver Collagen and at home, he does his own Unna wraps at home edema control seems reasonable. 04/20/15; the area on the right medial leg has Shawn very superficial area which may not even be open nevertheless I felt needed to be dressed today [this is his original chronic wound] the new wound is on the right anterior lateral leg is underwent Shawn surgical debridement. He has Shawn small wound on the right posterior leg. He puts on his own Unna boots, according to our nurses he does  this fairly well. We have been using Silver collagen. 05/02/2015 -- I understand in the past his venous studies have been done at [facility] and he was advised weight loss before they would attempt to tackle his iliac vein blockage. He has not gone back for review. 06/27/2015 -- we have applied for Apligraf and are awaiting his insurance clearance. 07/25/2015 -- he still has to use her back from his insurance agent regarding his copayment for his Apligraf. 07/31/2015 -- the patient got Shawn reply from the insurance agent regarding the dollar payment for his application but is not sure whether it is for 5 or for one. 11/28/2015 -- has been hurting Shawn little bit more and he has had change in color of his wound especially on the medial part. 12/05/2015 -- his culture was positive for Proteus mirabilis and K Oxytoca which are sensitive to ciprofloxacin which he is already on. 10/3/17still on ciprofloxacin. His mother reports of dressing had to be changed last Friday due to odor. He has not been systemically unwell 12/19/15; patient came in today complaining of increasing pain and tightness in his upper right thigh. He completed the ciprofloxacin 2 or 3 days ago. He is not running Shawn fever today. 12/26/2015 -- last week he had been seen by Dr. [name] who got Shawn lower extremity venous duplex evaluation done which did not show DVT or SVT in the right lower extremity. he had also put him on Augmentin in addition to the previous ciprofloxacin and he had received for 2 weeks 01/02/2016 -- -- was admitted to the hospital on 12/26/2015 and discharged on 12/29/2015 and was treated for cellulitis. He was also newly diagnosed with type 2 diabetes mellitus and outpatient monitoring and initiation of treatment was recommended. Patients hemoglobin A1c was 6.6 No osteomyelitis detected on x-rays and recent Doppler study was negative for DVT Oral doxycycline and Levaquin were recommended for the patient as an . outpatient  for Shawn 14 day treatment. IV Zosyn and vancomycin was given due to concerns of Pseudomonas infection while he  was in hospital. 02/13/2016 -- he has not gone back to [facility] where he had his vascular opinion earlier and I have urged him to regroup with them to see if there is any surgical options available. 02/27/2016 -- has an appointment to see [facility] vascular surgeons on January 3 and may be seeing Dr. Verdie Drown 03/19/2016 -- I was not able to find any notes on Epic but the patient did go to the vascular clinic at [facility] and saw the PA to Dr. Verdie Drown. I understand she has recommended Shawn CT scan and MRI to be done on February 1 and they would review this with him on the same day. 04/09/2016 -- was admitted to the hospital on 03/20/2016 acute respiratory failure with hypoxia, abdominal discomfort with erythema, hypertensive urgency and chronic wound of the right leg with morbid obesity. he was discharged home on 04/05/2016 and was to continue on IV antibiotics for 18 more days follow-up with the wound center and continue with his cardiologist. he has lost approximately 130 pounds and diuresed over 48 L. He was treated with IV Zosyn and ID recommended he continue this for 3 weeks more. The notes from [facility] wound clinic were noted where he was seen by the PA and she had taken cultures which grew MSSA and Pseudomonas. She had recommended edema control with Shawn 4-layer compression and also referred him to the lymphedema clinic. Shawn CT venogram was also planned for the future. 04/16/2016 -- at [facility] Shawn CT pelvic venogram runoff was done on 04/11/2016 -- IMPRESSION:-Limited study secondary to poor opacification of venous structures. No definite evidence of acute deep vein thrombosis. 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 -Extensive right iliac chain and right inguinal adenopathy,  favored to be reactive/secondary to congestion. The patient continues on his IV antibiotics through his PICC line and has his cardiology opinion and also Shawn bariatric surgery opinion coming up. 04/30/2016 -- He has completed his IV antibiotics through his PICC line. 05/14/2016 --he was seen by the PA, Ms Sluss, recommended alternate day dressing with compression and use Hibiclens and Dakin's solution with acetic acid on his wounds. 07/30/2016 -- he has still not got his custom-made compression stockings and I have urged him to go and get him self measured for these. 08/06/2016 -- he has been measured for his custom stocking and will get in 2 weeks. He has lost 20 pounds since March 08/27/2016 -- he has not got his custom stockings yet and he tried another pair which has not fit well and he has had Shawn lot of maceration increase the size of his wound. 09/03/2016 -- the patient says that he has not been very compliant with his dressing changes and his elevation and exercise and this week he is notices wound get rather large with maceration. 09/18/16; according to our intake nurse the measurements on this patient's wound are slightly larger. I note previous compliance concerns. I note that his wounds measured larger last week which was noted by Dr. Meyer Russel. Culture done last week was negative. 10/01/2016 -- the patient has received some lymphedema pumps which are new and he says he is being compliant with this. He is going to be away for 2 weeks on vacation to [facility] 10/15/2016 -- he returns after 2 weeks and tells me he has been diligent with his dressing and has lost 25 pounds over the last  3 months. 10/22/2016 -- his last hemoglobin A1c was 6.2 and he is now diet controlled. 11/19/2016 -- he has been having problems with his compression stockings which [facility] He has not yet been able to get them. He had taken out his compression because he had gone there and had no  dressing on when he came here today READMISSION 05/22/2018 [name] is Shawn now 40 year old man with Shawn Odea history of wounds in his lower extremities secondary to chronic venous insufficiency with secondary lymphedema. He has had previous vein ligations on the right although I do not have this information in front of me. As I understand things he also has central venous obstruction with Shawn vein bypass in 2005 I [facility] He tells me over the last 2 months he has noted mid reopening in the right mid anterior lower extremity. This is Shawn rectangular shaped wound which is kind of odd in terms of how that formed. He has been using silver alginate and Unna boots that we used on him when he was here in 2018. He buys his product supply online thinks this is cheaper than ordering through intermediary's Past medical history; CHF, morbid obesity, hypertension, hyperlipidemia, chronic venous insufficiency, vein bypass in 2005 previous vein ablations or ligations. He has Shawn stent in the right lower extremity. ABI in this clinic was 1.04. He is using his compression pumps at home 3/27; 2-week follow-up. Right lower extremity wounds related to chronic venous insufficiency and lymphedema. He has been using silver alginate under an Radio broadcast assistantUnna boot. He change the Foot LockerUnna boot himself at home last week. He is making nice progress 4/10; 2-week follow-up. Right lower extremity wound is just about closed Shawn small open area in the middle of the and large rectangular wound he had at the beginning. His edema control is excellent. He says he is using his compression pumps religiously 4/17; 2-week follow-up. Right lower extremity wound is totally closed. He had Shawn large rectangular wound which is progressively closed. His edema control is very good. He is using his compression pumps once to twice Shawn day READMISSION 09/19/2020 [name] is now Shawn 40 year old man we have had for several stays in this clinic including 2017,  2018 and most recently in 2020 with Shawn wound on his right lower leg. He has compression pumps which he claims to be using twice Shawn day. He also has stockings however I am not sure how much he uses them. Things have broken down over the last month or 2 he has an extensive wound area on the right mid tibial area I think this is similar to what he has had in the past. He has been using Xeroform and an Ace wrap that his girlfriend is applying. Past medical history; the patient has known chronic venous insufficiency with secondary lymphedema. He had an iliac vein blockage and he [facility] Shawn [facility] although the patient is not followed up with them. The surgeon may have been Dr. Jacolyn ReedyMarston. As noted he has lymphedema pumps. Type 2 diabetes congestive heart failure obstructive sleep apnea. He has Shawn history of Shawn right leg DVT although this may have been the iliac vein he is not currently on anticoagulation ABI in our clinic is 1.37 on the right Imaging Results - in this encounter CT Pelvic Venogram Run Off Imaging Results - CT Pelvic Venogram Run Off Impressions Performed At -Limited study secondary to poor opacification of venous structures. Nodefinite  evidence of acute deep vein thrombosis. 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 -Extensive right iliac chain and right inguinal adenopathy, favored to bereactive/secondary to congestion. [facility] Imaging Results - CT Pelvic Venogram Run Off Narrative Performed At EXAM: CT PELVIC VENOGRAM RUN OFF DATE: 04/11/2016 1:28 PM ACCESSION: [id number] UN DICTATED: 04/11/2016 2:18 PM INTERPRETATION LOCATION: [facility] CLINICAL INDICATION: 40 years old Male with h/o RLE DVT and massive lymphedema of both LE's. Eval for venous outflow obstruction vs extrinsic mass- L97.203-Skin ulcer of calf with necrosis of muscle, unspecified laterality(RAF-HCC) COMPARISON: CT  abdomen pelvis dated 02/11/2007 TECHNIQUE: Shawn spiral CT scan was obtained approximately 3 minutes after administration of IV contrast from the kidneys to the knees.Images were reconstructed in the axial plane. Multiplanar reformatted and MIP images were provided for further evaluation of the vessels. For selected cases, 3D volumerendered images are also provided. VASCULAR FINDINGS: There is overall poor contrast opacification of the venous structures, limitingevaluation. IVC: No evidence of thrombus. Iliac veins: The right iliac veins are chronically occluded/diminutive in size. There are extensive venous collaterals noted throughout the right pelvis extending into the right lower extremity. The number and caliber of the venous collaterals have increased when compared to 02/11/2007. The left iliac veins appear patent. There are also small caliber venous collaterals within the left pelvis extending into the left lower extremity. Anterior abdominal wall venouscollaterals are also noted but fully imaged secondary to patient diameter. The bilateral femoral and popliteal veins appear patent. There is no evidence of acute thrombus. The arterial vasculature is unremarkable on this venous phase time study. NONVASCULAR FINDINGS: LOWER CHEST Unremarkable. : ABDOMEN: HEPATOBILIARY: Unremarkable liver. No biliary ductal dilatation. Gallbladder isremarkable. PANCREAS: Unremarkable. SPLEEN: Unremarkable. ADRENAL GLANDS: Unremarkable. KIDNEYS/URETERS: Unremarkable. BLADDER: Unremarkable. BOWEL/PERITONEUM/RETROPERITONEUM: No bowel obstruction. No acute inflammatoryprocess. No ascites. LYMPH NODES: Extensive right iliac chain and right inguinal adenopathy. Shawn nodal conglomerate in the right inguinal region measures 6.8 x 3.4 cm (2:124). This is only slightly larger when compared to 02/11/2007. Given interval stabilityover 10 years, these are favored to be reactive/secondary to congestion. REPRODUCTIVE:  Unremarkable. BONES/SOFT TISSUES: No acute osseous abnormalities. Postsurgical changes in the right inguinal and left medial thigh region. Right greater than left softtissue edema throughout the lower extremities. [facility] Imaging Results - CT Pelvic Venogram Run Off Procedure Note Interface, Rad Results In - 04/11/2016 3:28 PM EST EXAM: CT PELVIC VENOGRAM RUN OFF DATE: 04/11/2016 1:28 PM ACCESSION: [id number] UN DICTATED: 04/11/2016 2:18 PM INTERPRETATION LOCATION: [facility] CLINICAL INDICATION: 40 years old Male with h/o RLE DVT and massive lymphedema of both LE's. Eval for venous outflow obstruction vs extrinsic mass- L97.203-Skin ulcer of calf with necrosis of muscle, unspecified laterality (RAF-HCC) COMPARISON: CT abdomen pelvis dated 02/11/2007 TECHNIQUE: Shawn spiral CT scan was obtained approximately 3 minutes after administration of IV contrast from the kidneys to the knees. Images were reconstructed in the axial plane. Multiplanar reformatted and MIP images were provided for further evaluation of the vessels. For selected cases, 3D volume rendered images are also provided. VASCULAR FINDINGS: There is overall poor contrast opacification of the venous structures, limiting evaluation. IVC: No evidence of thrombus. Iliac veins: The right iliac veins are chronically occluded/diminutive in size. There are extensive venous collaterals noted throughout the right pelvis extending into the right lower extremity. The number and caliber of the venous collaterals have increased when compared to 02/11/2007. The left iliac veins appear patent. There are also small caliber venous  collaterals within the left pelvis extending into the left lower extremity. Anterior abdominal wall venous collaterals are also noted but fully imaged secondary to patient diameter. The bilateral femoral and popliteal veins appear patent. There is no evidence of acute thrombus. The arterial vasculature is unremarkable on this  venous phase time study. NONVASCULAR FINDINGS: LOWER CHEST Unremarkable. : ABDOMEN: HEPATOBILIARY: Unremarkable liver. No biliary ductal dilatation. Gallbladder is remarkable. PANCREAS: Unremarkable. SPLEEN: Unremarkable. ADRENAL GLANDS: Unremarkable. KIDNEYS/URETERS: Unremarkable. BLADDER: Unremarkable. BOWEL/PERITONEUM/RETROPERITONEUM: No bowel obstruction. No acute inflammatory process. No ascites. LYMPH NODES: Extensive right iliac chain and right inguinal adenopathy. Shawn nodal conglomerate in the right inguinal region measures 6.8 x 3.4 cm (2:124). This is only slightly larger when compared to 02/11/2007. Given interval stability over 10 years, these are favored to be reactive/secondary to congestion. REPRODUCTIVE: Unremarkable. BONES/SOFT TISSUES: No acute osseous abnormalities. Postsurgical changes in the right inguinal and left medial thigh region. Right greater than left soft tissue edema throughout the lower extremities. IMPRESSION: -Limited study secondary to poor opacification of venous structures. No definite evidence of acute deep vein thrombosis. 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 -Extensive right iliac chain and right inguinal adenopathy, favored to be reactive/secondary to congestion. Imaging Results - CT Pelvic Venogram Run Off Performing Organization Address City/State/Zipcode Phone Number [facility] [address] [place], [place] [address] Surgical summary as listed below; Assessment: [name] is Shawn 40 y.o. male with Vaughan history of right lower extremity chronic obstructive deep venous disease with recurrent ulceration, absence of right iliac vein, prior left to right femoral vein bypass. Patient continues with recurrent ulceration, his weight precludes him from further vascular studies at this time. If he is able to lose 50-100 pounds we would be able to do left lower  extremity venogram possible intervention with 50% chance of re- opening occlusion per Dr. Jacolyn Reedy. For global health, weight loss, potential improvement in his chronic venous insufficiency would appreciateinput from bariatric surgery group for recommendations. 09/26/20; patient comes in today with not much improvement. He has also some odor. 3 open areas and this linear area in his mid calf require debridement. We have been using silver alginate under compression. I gave him Keflex last week for what I thought was cellulitis in the right medial thigh he is completing this and I think this is better. Electronic Signature(s) Signed: 09/26/2020 5:24:30 PM By: Shawn Najjar MD Entered By: [name] on 09/26/2020 17:19:42 -------------------------------------------------------------------------------- Physical Exam Details Patient Name: Date of Service: [name], Shawn DRIA N T. 09/26/2020 2:15 PM Medical Record Number: [id number] Patient Account Number: [id number] Date of Birth/Sex: Treating RN: [birth year] (40 y.o. [name] Primary Care Provider: Marcy [name] Other Clinician: Referring Provider: Treating Provider/Extender: [name] in Treatment: 1 Constitutional Patient is hypertensive.. Pulse regular and within target range for patient.[name] Respirations regular, non-labored and within target range.. Temperature is normal and within the target range for the patient.[name] Appears in no distress. Notes Wound exam; narrow rectangle of the de-epithelialized area with 4 areas that are deeper. All of these areas require debridement. I used an open curette removing subcutaneous tissue to remove surface debris Electronic Signature(s) Signed: 09/26/2020 5:24:30 PM By: Shawn Najjar MD Entered By: [name] on 09/26/2020 17:19:04 -------------------------------------------------------------------------------- Physician Orders Details Patient Name: Date  of Service: [name], Shawn DRIA N T. 09/26/2020 2:15 PM Medical Record Number: [id number] Patient Account Number: [id number] Date of Birth/Sex: Treating  RN: 1980-10-05 (40 y.o. [name] Primary Care Provider: Marcy [name] Other Clinician: Referring Provider: Treating Provider/Extender: [name] in Treatment: 1 Verbal / Phone Orders: No Diagnosis Coding Follow-up Appointments ppointment in 1 week. - Tuesday with Dr. [name] Return Shawn Other: - I will call [facility] to schedule you Shawn follow up appt. Please be on the lookout for Shawn phone call from them! Bathing/ Shower/ Hygiene May shower with protection but do not get wound dressing(s) wet. - May use Shawn cast wrap to put over wraps to shower; you can buy these at [facility] or [facility] Edema Control - Lymphedema / SCD / Other Lymphedema Pumps. Use Lymphedema pumps on leg(s) 2-3 times Shawn day for 45-60 minutes. If wearing any wraps or hose, do not remove them. Continue exercising as instructed. Elevate legs to the level of the heart or above for 30 minutes daily and/or when sitting, Shawn frequency of: Avoid standing for Zellner periods of time. Patient to wear own compression stockings every day. - on Left Leg Wound Treatment Wound #32 - Lower Leg Wound Laterality: Right, Medial Cleanser: Soap and Water 1 x Per Week/15 Days Discharge Instructions: May shower and wash wound with dial antibacterial soap and water prior to dressing change. Cleanser: Wound Cleanser 1 x Per Week/15 Days Discharge Instructions: Cleanse the wound with wound cleanser prior to applying Shawn clean dressing using gauze sponges, not tissue or cotton balls. Peri-Wound Care: Triamcinolone 15 (g) 1 x Per Week/15 Days Discharge Instructions: Use triamcinolone 15 (g) as directed Peri-Wound Care: Sween Lotion (Moisturizing lotion) 1 x Per Week/15 Days Discharge Instructions: Apply moisturizing lotion as directed Prim Dressing: KerraCel Ag Gelling  Fiber Dressing, 4x5 in (silver alginate) 1 x Per Week/15 Days ary Discharge Instructions: Apply silver alginate to wound bed as instructed Secondary Dressing: Woven Gauze Sponge, Non-Sterile 4x4 in 1 x Per Week/15 Days Discharge Instructions: Apply over primary dressing as directed. Secondary Dressing: ABD Pad, 5x9 1 x Per Week/15 Days Discharge Instructions: Apply over primary dressing as directed. Secondary Dressing: Zetuvit Plus 4x8 in 1 x Per Week/15 Days Discharge Instructions: Apply over primary dressing as directed. Compression Wrap: FourPress (4 layer compression wrap) 1 x Per Week/15 Days Discharge Instructions: Apply four layer compression as directed. May also use Miliken CoFlex 2 layer compression system as alternative. Wound #33 - Lower Leg Wound Laterality: Right, Lateral Cleanser: Soap and Water 1 x Per Week/15 Days Discharge Instructions: May shower and wash wound with dial antibacterial soap and water prior to dressing change. Cleanser: Wound Cleanser 1 x Per Week/15 Days Discharge Instructions: Cleanse the wound with wound cleanser prior to applying Shawn clean dressing using gauze sponges, not tissue or cotton balls. Peri-Wound Care: Triamcinolone 15 (g) 1 x Per Week/15 Days Discharge Instructions: Use triamcinolone 15 (g) as directed Peri-Wound Care: Sween Lotion (Moisturizing lotion) 1 x Per Week/15 Days Discharge Instructions: Apply moisturizing lotion as directed Prim Dressing: KerraCel Ag Gelling Fiber Dressing, 4x5 in (silver alginate) 1 x Per Week/15 Days ary Discharge Instructions: Apply silver alginate to wound bed as instructed Secondary Dressing: Woven Gauze Sponge, Non-Sterile 4x4 in 1 x Per Week/15 Days Discharge Instructions: Apply over primary dressing as directed. Secondary Dressing: ABD Pad, 5x9 1 x Per Week/15 Days Discharge Instructions: Apply over primary dressing as directed. Secondary Dressing: Zetuvit Plus 4x8 in 1 x Per Week/15 Days Discharge  Instructions: Apply over primary dressing as directed. Compression Wrap: FourPress (4 layer compression wrap) 1 x Per Week/15 Days Discharge  Instructions: Apply four layer compression as directed. May also use Miliken CoFlex 2 layer compression system as alternative. [facility] - Referral r/t hx of venous insufficiency and lymphedema with wound on Right leg that has continued to re-open over the last several years. Pt. was followed by [facility] for his venous issues, but pt. has not been back since 2015 and the [facility]in Center will not consider seeing pt. at this time. See notes attached. Electronic Signature(s) Signed: 09/28/2020 7:48:44 PM By: Shawn Mu RN Signed: 10/02/2020 12:43:15 PM By: Shawn Najjar MD Previous Signature: 09/26/2020 5:24:30 PM Version By: Shawn Najjar MD Previous Signature: 09/26/2020 5:32:12 PM Version By: Shawn Mu RN Entered By: [name] on 09/28/2020 19:41:54 -------------------------------------------------------------------------------- Problem List Details Patient Name: Date of Service: [name], Shawn DRIA N T. 09/26/2020 2:15 PM Medical Record Number: [id number] Patient Account Number: [id number] Date of Birth/Sex: Treating RN: 04-23-1980 (40 y.o. [name], [name] Primary Care Provider: Marcy [name] Other Clinician: Referring Provider: Treating Provider/Extender: [name] in Treatment: 1 Active Problems ICD-10 Encounter Code Description Active Date MDM Diagnosis I87.331 Chronic venous hypertension (idiopathic) with ulcer and inflammation of right 09/19/2020 No Yes lower extremity I89.0 Lymphedema, not elsewhere classified 09/19/2020 No Yes L97.818 Non-pressure chronic ulcer of other part of right lower leg with other specified 09/19/2020 No Yes severity Inactive Problems Resolved Problems Electronic Signature(s) Signed: 09/26/2020 5:24:30 PM By: Shawn Najjar MD Entered By: [name] on 09/26/2020 17:13:36 -------------------------------------------------------------------------------- Progress Note Details Patient Name: Date of Service: [name], Shawn Oppenheim T. 09/26/2020 2:15 PM Medical Record Number: [id number] Patient Account Number: [id number] Date of Birth/Sex: Treating RN: April 26, 1980 (40 y.o. [name], [name] Primary Care Provider: Marcy [name] Other Clinician: Referring Provider: Treating Provider/Extender: [name] in Treatment: 1 Subjective History of Present Illness (HPI) The following HPI elements were documented for the patient's wound: Location: Patient presents with Shawn wound to right lower leg. Quality: very chronic but clean Duration: over 1 year Modifying Factors: r iliac obs or absence, obesity The patient is Shawn 40 yrs old bm here for evaluation of his right leg ulcer. He has an extensive history of lymphedema and ulcers. He is being treated at the [facility] by Dr. Wiliam [name] with [name] boots and the [facility]. He has pumps at home but he is only using them once Shawn day. 08/30/14 selective debridement done of surface eschar. The wound cleans up quite nicely in the bed of this looks healthy. He is using Shawn wound VAC under an Unna wrap. 11/17/14; selective debridement done of surface eschar. Once again the wound beds at clean up quite nicely. He is no longer using Shawn wound VAC. Recent dressing changes include Hydrofera Blue. Apparently he has done Shawn wide variety of different topical dressings including advanced treatment options like Apligraf's without success. 03/21/15; surgical debridement done of surface eschar and nonviable subcutaneous tissue. The area on the medial aspect of the right leg has closed and the wound on the lateral right leg looks improved has been using Silver Collagen 04/11/15; I think attendance here is sporadic. The area on the right medial leg remains healed. He has Shawn new tiny area on  the back of the right leg. Again Shawn surgical debridement of the surface eschar and nonviable subcutaneous tissue of the major wound on the right lateral leg. He has been using Silver Collagen and at home, he does his own Unna wraps at home edema control seems  reasonable. 04/20/15; the area on the right medial leg has Shawn very superficial area which may not even be open nevertheless I felt needed to be dressed today [this is his original chronic wound] the new wound is on the right anterior lateral leg is underwent Shawn surgical debridement. He has Shawn small wound on the right posterior leg. He puts on his own Unna boots, according to our nurses he does this fairly well. We have been using Silver collagen. 05/02/2015 -- I understand in the past his venous studies have been done at [facility] and he was advised weight loss before they would attempt to tackle his iliac vein blockage. He has not gone back for review. 06/27/2015 -- we have applied for Apligraf and are awaiting his insurance clearance. 07/25/2015 -- he still has to use her back from his insurance agent regarding his copayment for his Apligraf. 07/31/2015 -- the patient got Shawn reply from the insurance agent regarding the dollar payment for his application but is not sure whether it is for 5 or for one. 11/28/2015 -- has been hurting Shawn little bit more and he has had change in color of his wound especially on the medial part. 12/05/2015 -- his culture was positive for Proteus mirabilis and K Oxytoca which are sensitive to ciprofloxacin which he is already on. 12/12/15oostill on ciprofloxacin. His mother reports of dressing had to be changed last Friday due to odor. He has not been systemically unwell 12/19/15; patient came in today complaining of increasing pain and tightness in his upper right thigh. He completed the ciprofloxacin 2 or 3 days ago. He is not running Shawn fever today. 12/26/2015 -- last week he had been seen by Dr. [name] who got Shawn lower  extremity venous duplex evaluation done which did not show DVT or SVT in the right lower extremity. he had also put him on Augmentin in addition to the previous ciprofloxacin and he had received for 2 weeks 01/02/2016 -- -- was admitted to the hospital on 12/26/2015 and discharged on 12/29/2015 and was treated for cellulitis. He was also newly diagnosed with type 2 diabetes mellitus and outpatient monitoring and initiation of treatment was recommended. Patientoos hemoglobin A1c was 6.6 No osteomyelitis detected on x-rays and recent Doppler study was negative for DVT Oral doxycycline and Levaquin were recommended for the patient as an . outpatient for Shawn 14 day treatment. IV Zosyn and vancomycin was given due to concerns of Pseudomonas infection while he was in hospital. 02/13/2016 -- he has not gone back to [facility] where he had his vascular opinion earlier and I have urged him to regroup with them to see if there is any surgical options available. 02/27/2016 -- has an appointment to see [facility] vascular surgeons on January 3 and may be seeing Dr. Verdie Drown 03/19/2016 -- I was not able to find any notes on Epic but the patient did go to the vascular clinic at [facility] and saw the PA to Dr. Verdie Drown. I understand she has recommended Shawn CT scan and MRI to be done on February 1 and they would review this with him on the same day. 04/09/2016 -- was admitted to the hospital on 03/20/2016 acute respiratory failure with hypoxia, abdominal discomfort with erythema, hypertensive urgency and chronic wound of the right leg with morbid obesity. he was discharged home on 04/05/2016 and was to continue on IV antibiotics for 18 more days follow-up with the wound center and continue with  his cardiologist. he has lost approximately 130 pounds and diuresed over 48 L. He was treated with IV Zosyn and ID recommended he continue this for 3 weeks more. The notes from [facility] wound clinic were noted  where he was seen by the PA and she had taken cultures which grew MSSA and Pseudomonas. She had recommended edema control with Shawn 4-layer compression and also referred him to the lymphedema clinic. Shawn CT venogram was also planned for the future. 04/16/2016 -- at [facility] Shawn [facility] pelvic venogram runoff was done on 04/11/2016 -- IMPRESSION:-Limited study secondary to poor opacification of venous structures. No definite evidence of acute deep vein thrombosis. 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 -Extensive right iliac chain and right inguinal adenopathy, favored to be reactive/secondary to congestion. The patient continues on his IV antibiotics through his PICC line and has his cardiology opinion and also Shawn bariatric surgery opinion coming up. 04/30/2016 -- He has completed his IV antibiotics through his PICC line. 05/14/2016 --he was seen by the PA, Ms Sluss, recommended alternate day dressing with compression and use Hibiclens and Dakin's solution with acetic acid on his wounds. 07/30/2016 -- he has still not got his custom-made compression stockings and I have urged him to go and get him self measured for these. 08/06/2016 -- he has been measured for his custom stocking and will get in 2 weeks. He has lost 20 pounds since March 08/27/2016 -- he has not got his custom stockings yet and he tried another pair which has not fit well and he has had Shawn lot of maceration increase the size of his wound. 09/03/2016 -- the patient says that he has not been very compliant with his dressing changes and his elevation and exercise and this week he is notices wound get rather large with maceration. 09/18/16; according to our intake nurse the measurements on this patient's wound are slightly larger. I note previous compliance concerns. I note that his wounds measured larger last week which was noted by Dr. Meyer Russel. Culture  done last week was negative. 10/01/2016 -- the patient has received some lymphedema pumps which are new and he says he is being compliant with this. He is going to be away for 2 weeks on vacation to [facility] 10/15/2016 -- he returns after 2 weeks and tells me he has been diligent with his dressing and has lost 25 pounds over the last 3 months. 10/22/2016 -- his last hemoglobin A1c was 6.2 and he is now diet controlled. 11/19/2016 -- he has been having problems with his compression stockings which are custom made at [facility]. He has not yet been able to get them. He had taken out his compression because he had gone there and had no dressing on when he came here today READMISSION 05/22/2018 Mr. Ruttan is Shawn now 40 year old man with Shawn Rothschild history of wounds in his lower extremities secondary to chronic venous insufficiency with secondary lymphedema. He has had previous vein ligations on the right although I do not have this information in front of me. As I understand things he also has central venous obstruction with Shawn vein bypass in 2005 I believe this was done at [facility]. He tells me over the last 2 months he has noted mid reopening in the right mid anterior lower extremity. This is Shawn rectangular shaped wound which is kind of odd in terms of how that formed. He has been using  silver alginate and Unna boots that we used on him when he was here in 2018. He buys his product supply online thinks this is cheaper than ordering through intermediary's Past medical history; CHF, morbid obesity, hypertension, hyperlipidemia, chronic venous insufficiency, vein bypass in 2005 previous vein ablations or ligations. He has Shawn stent in the right lower extremity. ABI in this clinic was 1.04. He is using his compression pumps at home 3/27; 2-week follow-up. Right lower extremity wounds related to chronic venous insufficiency and lymphedema. He has been using silver alginate under an Radio broadcast assistant. He change the [employer]  himself at home last week. He is making nice progress 4/10; 2-week follow-up. Right lower extremity wound is just about closed Shawn small open area in the middle of the and large rectangular wound he had at the beginning. His edema control is excellent. He says he is using his compression pumps religiously 4/17; 2-week follow-up. Right lower extremity wound is totally closed. He had Shawn large rectangular wound which is progressively closed. His edema control is very good. He is using his compression pumps once to twice Shawn day READMISSION 09/19/2020 [name] is now Shawn 40 year old man we have had for several stays in this clinic including 2017, 2018 and most recently in 2020 with Shawn wound on his right lower leg. He has compression pumps which he claims to be using twice Shawn day. He also has stockings however I am not sure how much he uses them. Things have broken down over the last month or 2 he has an extensive wound area on the right mid tibial area I think this is similar to what he has had in the past. He has been using Xeroform and an Ace wrap that his girlfriend is applying. Past medical history; the patient has known chronic venous insufficiency with secondary lymphedema. He had an iliac vein blockage and he had Shawn bypass at [facility] although the patient is not followed up with them. The surgeon may have been Dr. Jacolyn Reedy. As noted he has lymphedema pumps. Type 2 diabetes congestive heart failure obstructive sleep apnea. He has Shawn history of Shawn right leg DVT although this may have been the iliac vein he is not currently on anticoagulation ABI in our clinic is 1.37 on the right Imaging Results - in this encounter CT Pelvic Venogram Run Off Imaging Results - CT Pelvic Venogram Run Off Impressions Performed At -Limited study secondary to poor opacification of venous structures. Nodefinite evidence of acute deep vein thrombosis. 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 -Extensive right iliac chain and right inguinal adenopathy, favored to bereactive/secondary to congestion. [facility] Imaging Results - CT Pelvic Venogram Run Off Narrative Performed At EXAM: CT PELVIC VENOGRAM RUN OFF DATE: 04/11/2016 1:28 PM ACCESSION: [id number] UN DICTATED: 04/11/2016 2:18 PM INTERPRETATION LOCATION: [facility] CLINICAL INDICATION: 40 years old Male with h/o RLE DVT and massive lymphedema of both LE's. Eval for venous outflow obstruction vs extrinsic mass- L97.203-Skin ulcer of calf with necrosis of muscle, unspecified laterality(RAF-HCC) COMPARISON: CT abdomen pelvis dated 02/11/2007 TECHNIQUE: Shawn spiral CT scan was obtained approximately 3 minutes after administration of IV contrast from the kidneys to the knees. Images were reconstructed in the axial plane. Multiplanar reformatted and MIP images were provided for further evaluation of the vessels. For selected cases, 3D volumerendered images are also provided. VASCULAR FINDINGS: There is overall poor contrast opacification of the venous structures, limitingevaluation. IVC:  No evidence of thrombus. Iliac veins: The right iliac veins are chronically occluded/diminutive in size. There are extensive venous collaterals noted throughout the right pelvis extending into the right lower extremity. The number and caliber of the venous collaterals have increased when compared to 02/11/2007. The left iliac veins appear patent. There are also small caliber venous collaterals within the left pelvis extending into the left lower extremity. Anterior abdominal wall venouscollaterals are also noted but fully imaged secondary to patient diameter. The bilateral femoral and popliteal veins appear patent. There is no evidence of acute thrombus. The arterial vasculature is unremarkable on this venous phase time study. NONVASCULAR FINDINGS: LOWER CHEST  Unremarkable. : ABDOMEN: HEPATOBILIARY: Unremarkable liver. No biliary ductal dilatation. Gallbladder isremarkable. PANCREAS: Unremarkable. SPLEEN: Unremarkable. ADRENAL GLANDS: Unremarkable. KIDNEYS/URETERS: Unremarkable. BLADDER: Unremarkable. BOWEL/PERITONEUM/RETROPERITONEUM: No bowel obstruction. No acute inflammatoryprocess. No ascites. LYMPH NODES: Extensive right iliac chain and right inguinal adenopathy. Shawn nodal conglomerate in the right inguinal region measures 6.8 x 3.4 cm (2:124). This is only slightly larger when compared to 02/11/2007. Given interval stabilityover 10 years, these are favored to be reactive/secondary to congestion. REPRODUCTIVE: Unremarkable. BONES/SOFT TISSUES: No acute osseous abnormalities. Postsurgical changes in the right inguinal and left medial thigh region. Right greater than left softtissue edema throughout the lower extremities. [facility] Imaging Results - CT Pelvic Venogram Run Off Procedure Note Interface, Rad Results In - 04/11/2016 3:28 PM EST EXAM: CT PELVIC VENOGRAM RUN OFF DATE: 04/11/2016 1:28 PM ACCESSION: [id number] UN DICTATED: 04/11/2016 2:18 PM INTERPRETATION LOCATION: [facility] CLINICAL INDICATION: 40 years old Male with h/o RLE DVT and massive lymphedema of both LE's. Eval for venous outflow obstruction vs extrinsic mass- L97.203-Skin ulcer of calf with necrosis of muscle, unspecified laterality (RAF-HCC) COMPARISON: CT abdomen pelvis dated 02/11/2007 TECHNIQUE: Shawn spiral CT scan was obtained approximately 3 minutes after administration of IV contrast from the kidneys to the knees. Images were reconstructed in the axial plane. Multiplanar reformatted and MIP images were provided for further evaluation of the vessels. For selected cases, 3D volume rendered images are also provided. VASCULAR FINDINGS: There is overall poor contrast opacification of the venous structures, limiting evaluation. IVC: No evidence of thrombus. Iliac veins: The  right iliac veins are chronically occluded/diminutive in size. There are extensive venous collaterals noted throughout the right pelvis extending into the right lower extremity. The number and caliber of the venous collaterals have increased when compared to 02/11/2007. The left iliac veins appear patent. There are also small caliber venous collaterals within the left pelvis extending into the left lower extremity. Anterior abdominal wall venous collaterals are also noted but fully imaged secondary to patient diameter. The bilateral femoral and popliteal veins appear patent. There is no evidence of acute thrombus. The arterial vasculature is unremarkable on this venous phase time study. NONVASCULAR FINDINGS: LOWER CHEST Unremarkable. : ABDOMEN: HEPATOBILIARY: Unremarkable liver. No biliary ductal dilatation. Gallbladder is remarkable. PANCREAS: Unremarkable. SPLEEN: Unremarkable. ADRENAL GLANDS: Unremarkable. KIDNEYS/URETERS: Unremarkable. BLADDER: Unremarkable. BOWEL/PERITONEUM/RETROPERITONEUM: No bowel obstruction. No acute inflammatory process. No ascites. LYMPH NODES: Extensive right iliac chain and right inguinal adenopathy. Shawn nodal conglomerate in the right inguinal region measures 6.8 x 3.4 cm (2:124). This is only slightly larger when compared to 02/11/2007. Given interval stability over 10 years, these are favored to be reactive/secondary to congestion. REPRODUCTIVE: Unremarkable. BONES/SOFT TISSUES: No acute osseous abnormalities. Postsurgical changes in the right inguinal and left medial thigh region. Right greater than left soft tissue edema throughout the lower extremities. IMPRESSION: -Limited study secondary to poor opacification  of venous structures. No definite evidence of acute deep vein thrombosis. 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 -Extensive right iliac chain and  right inguinal adenopathy, favored to be reactive/secondary to congestion. Imaging Results - CT Pelvic Venogram Run Off Performing Organization Address City/State/Zipcode Phone Number [facility] [address] [place], [place] [address] Surgical summary as listed below; Assessment: [name] is Shawn 40 y.o. male with Giambalvo history of right lower extremity chronic obstructive deep venous disease with recurrent ulceration, absence of right iliac vein, prior left to right femoral vein bypass. Patient continues with recurrent ulceration, his weight precludes him from further vascular studies at this time. If he is able to lose 50-100 pounds we would be able to do left lower extremity venogram possible intervention with 50% chance of re- opening occlusion per Dr. Jacolyn Reedy. For global health, weight loss, potential improvement in his chronic venous insufficiency would appreciateinput from bariatric surgery group for recommendations. 09/26/20; patient comes in today with not much improvement. He has also some odor. 3 open areas and this linear area in his mid calf require debridement. We have been using silver alginate under compression. I gave him Keflex last week for what I thought was cellulitis in the right medial thigh he is completing this and I think this is better. Objective Constitutional Patient is hypertensive.. Pulse regular and within target range for patient.[name] Respirations regular, non-labored and within target range.. Temperature is normal and within the target range for the patient.[name] Appears in no distress. Vitals Time Taken: 2:48 PM, Height: 74 in, Weight: 425 lbs, BMI: 54.6, Temperature: 98.7 F, Pulse: 87 bpm, Respiratory Rate: 18 breaths/min, Blood Pressure: 166/95 mmHg. General Notes: Wound exam; narrow rectangle of the de-epithelialized area with 4 areas that are deeper. All of these areas require debridement. I used an open curette removing subcutaneous tissue to remove surface  debris Integumentary (Hair, Skin) Wound #32 status is Open. Original cause of wound was Gradually Appeared. The date acquired was: 07/11/2020. The wound has been in treatment 1 weeks. The wound is located on the Right,Medial Lower Leg. The wound measures 5.7cm length x 23cm width x 0.2cm depth; 102.966cm^2 area and 20.593cm^3 volume. There is Fat Layer (Subcutaneous Tissue) exposed. There is no tunneling or undermining noted. There is Shawn large amount of serosanguineous drainage noted. Foul odor after cleansing was noted. The wound margin is distinct with the outline attached to the wound base. There is medium (34-66%) red granulation within the wound bed. There is Shawn medium (34-66%) amount of necrotic tissue within the wound bed including Adherent Slough. Wound #33 status is Open. Original cause of wound was Gradually Appeared. The date acquired was: 07/10/2020. The wound has been in treatment 1 weeks. The wound is located on the Right,Lateral Lower Leg. The wound measures 0.4cm length x 0.4cm width x 0.1cm depth; 0.126cm^2 area and 0.013cm^3 volume. There is Fat Layer (Subcutaneous Tissue) exposed. There is no tunneling or undermining noted. There is Shawn medium amount of serosanguineous drainage noted. The wound margin is distinct with the outline attached to the wound base. There is large (67-100%) red granulation within the wound bed. There is no necrotic tissue within the wound bed. Assessment Active Problems ICD-10 Chronic venous hypertension (idiopathic) with ulcer and inflammation of right lower extremity Lymphedema, not elsewhere classified Non-pressure chronic ulcer of other part of right lower leg with other specified severity Procedures Wound #32 Pre-procedure diagnosis of  Wound #32 is Shawn Lymphedema located on the Right,Medial Lower Leg . There was Shawn Excisional Skin/Subcutaneous Tissue Debridement with Shawn total area of 27 sq cm performed by Shawn Caul., MD. With the following  instrument(s): Curette to remove Viable and Non-Viable tissue/material. Material removed includes Subcutaneous Tissue, Slough, Skin: Dermis, and Skin: Epidermis after achieving pain control using Lidocaine. No specimens were taken. Shawn time out was conducted at 15:10, prior to the start of the procedure. Shawn Minimum amount of bleeding was controlled with Pressure. The procedure was tolerated well with Shawn pain level of 0 throughout and Shawn pain level of 0 following the procedure. Post Debridement Measurements: 5.7cm length x 23cm width x 0.2cm depth; 20.593cm^3 volume. Character of Wound/Ulcer Post Debridement is improved. Post procedure Diagnosis Wound #32: Same as Pre-Procedure Pre-procedure diagnosis of Wound #32 is Shawn Lymphedema located on the Right,Medial Lower Leg . There was Shawn Four Layer Compression Therapy Procedure by Shawn Mu, RN. Post procedure Diagnosis Wound #32: Same as Pre-Procedure Wound #33 Pre-procedure diagnosis of Wound #33 is Shawn Lymphedema located on the Right,Lateral Lower Leg . There was Shawn Excisional Skin/Subcutaneous Tissue Debridement with Shawn total area of 0.16 sq cm performed by Shawn Caul., MD. With the following instrument(s): Curette to remove Viable and Non-Viable tissue/material. Material removed includes Subcutaneous Tissue, Slough, Skin: Dermis, and Skin: Epidermis after achieving pain control using Lidocaine. No specimens were taken. Shawn time out was conducted at 15:11, prior to the start of the procedure. Shawn Minimum amount of bleeding was controlled with Pressure. The procedure was tolerated well with Shawn pain level of 0 throughout and Shawn pain level of 0 following the procedure. Post Debridement Measurements: 0.4cm length x 0.4cm width x 0.1cm depth; 0.013cm^3 volume. Character of Wound/Ulcer Post Debridement is improved. Post procedure Diagnosis Wound #33: Same as Pre-Procedure Plan Follow-up Appointments: Return Appointment in 1 week. - Tuesday with Dr.  [name] Other: - I will call [facility] to schedule you Shawn follow up appt. Please be on the lookout for Shawn phone call from them! Bathing/ Shower/ Hygiene: May shower with protection but do not get wound dressing(s) wet. - May use Shawn cast wrap to put over wraps to shower; you can buy these at [facility] or [facility] Edema Control - Lymphedema / SCD / Other: Lymphedema Pumps. Use Lymphedema pumps on leg(s) 2-3 times Shawn day for 45-60 minutes. If wearing any wraps or hose, do not remove them. Continue exercising as instructed. Elevate legs to the level of the heart or above for 30 minutes daily and/or when sitting, Shawn frequency of: Avoid standing for [place] periods of time. Patient to wear own compression stockings every day. - on Left Leg WOUND #32: - Lower Leg Wound Laterality: Right, Medial Cleanser: Soap and Water 1 x Per Week/15 Days Discharge Instructions: May shower and wash wound with dial antibacterial soap and water prior to dressing change. Cleanser: Wound Cleanser 1 x Per Week/15 Days Discharge Instructions: Cleanse the wound with wound cleanser prior to applying Shawn clean dressing using gauze sponges, not tissue or cotton balls. Peri-Wound Care: Triamcinolone 15 (g) 1 x Per Week/15 Days Discharge Instructions: Use triamcinolone 15 (g) as directed Peri-Wound Care: Sween Lotion (Moisturizing lotion) 1 x Per Week/15 Days Discharge Instructions: Apply moisturizing lotion as directed Prim Dressing: KerraCel Ag Gelling Fiber Dressing, 4x5 in (silver alginate) 1 x Per Week/15 Days ary Discharge Instructions: Apply silver alginate to wound bed as instructed Secondary Dressing: Woven Gauze Sponge, Non-Sterile 4x4 in 1  x Per Week/15 Days Discharge Instructions: Apply over primary dressing as directed. Secondary Dressing: ABD Pad, 5x9 1 x Per Week/15 Days Discharge Instructions: Apply over primary dressing as directed. Secondary Dressing: Zetuvit Plus 4x8 in 1 x Per Week/15 Days Discharge Instructions: Apply  over primary dressing as directed. Com pression Wrap: FourPress (4 layer compression wrap) 1 x Per Week/15 Days Discharge Instructions: Apply four layer compression as directed. May also use Miliken CoFlex 2 layer compression system as alternative. WOUND #33: - Lower Leg Wound Laterality: Right, Lateral Cleanser: Soap and Water 1 x Per Week/15 Days Discharge Instructions: May shower and wash wound with dial antibacterial soap and water prior to dressing change. Cleanser: Wound Cleanser 1 x Per Week/15 Days Discharge Instructions: Cleanse the wound with wound cleanser prior to applying Shawn clean dressing using gauze sponges, not tissue or cotton balls. Peri-Wound Care: Triamcinolone 15 (g) 1 x Per Week/15 Days Discharge Instructions: Use triamcinolone 15 (g) as directed Peri-Wound Care: Sween Lotion (Moisturizing lotion) 1 x Per Week/15 Days Discharge Instructions: Apply moisturizing lotion as directed Prim Dressing: KerraCel Ag Gelling Fiber Dressing, 4x5 in (silver alginate) 1 x Per Week/15 Days ary Discharge Instructions: Apply silver alginate to wound bed as instructed Secondary Dressing: Woven Gauze Sponge, Non-Sterile 4x4 in 1 x Per Week/15 Days Discharge Instructions: Apply over primary dressing as directed. Secondary Dressing: ABD Pad, 5x9 1 x Per Week/15 Days Discharge Instructions: Apply over primary dressing as directed. Secondary Dressing: Zetuvit Plus 4x8 in 1 x Per Week/15 Days Discharge Instructions: Apply over primary dressing as directed. Com pression Wrap: FourPress (4 layer compression wrap) 1 x Per Week/15 Days Discharge Instructions: Apply four layer compression as directed. May also use Miliken CoFlex 2 layer compression system as alternative. 1. Continue with silver alginate as the primary dressing here. ABDs under 4-layer compression 2. After giving this some thought I thought I would send him back to vein and vascular at [facility]. While their last notes did not sound  optimistic that anything could be done because of among other things as morbid obesity etc. however I think the amount of edema that he is developing in the right leg is worrisome. Electronic Signature(s) Signed: 09/26/2020 5:24:30 PM By: Shawn Najjar MD Entered By: [name] on 09/26/2020 17:21:14 -------------------------------------------------------------------------------- SuperBill Details Patient Name: Date of Service: [name], Shawn DRIA Dorris Carnes T. 09/26/2020 Medical Record Number: [id number] Patient Account Number: [id number] Date of Birth/Sex: Treating RN: 31-Jul-1980 (40 y.o. [name], [name] Primary Care Provider: Marcy [name] Other Clinician: Referring Provider: Treating Provider/Extender: [name] in Treatment: 1 Diagnosis Coding ICD-10 Codes Code Description [phone] Chronic venous hypertension (idiopathic) with ulcer and inflammation of right lower extremity I89.0 Lymphedema, not elsewhere classified L97.818 Non-pressure chronic ulcer of other part of right lower leg with other specified severity Facility Procedures CPT4 Code: [id number] Description: 11042 - DEB SUBQ TISSUE 20 SQ CM/< ICD-10 Diagnosis Description L97.818 Non-pressure chronic ulcer of other part of right lower leg with other specified Modifier: severity Quantity: 1 CPT4 Code: [id number] Description: 11045 - DEB SUBQ TISS EA ADDL 20CM ICD-10 Diagnosis Description L97.818 Non-pressure chronic ulcer of other part of right lower leg with other specified Modifier: severity Quantity: 6 Physician Procedures : CPT4 Code Description Modifier [id number] 11042 - WC PHYS SUBQ TISS 20 SQ CM ICD-10 Diagnosis Description L97.818 Non-pressure chronic ulcer of other part of right lower leg with other specified severity Quantity: 1 : [id number] 11045 - WC PHYS SUBQ TISS EA ADDL 20 CM ICD-10  Diagnosis Description L97.818 Non-pressure chronic ulcer of other part of right lower leg with other  specified severity Quantity: 6 Electronic Signature(s) Signed: 09/26/2020 5:24:30 PM By: Shawn Najjar MD Entered By: [name] on 09/26/2020 17:21:27

## 2020-09-26 NOTE — Progress Notes (Signed)
[name], Shawn T ([id number]) . Visit Report for 09/26/2020 Arrival Information Details Patient Name: Date of Service: [name] 09/26/2020 2:15 PM Medical Record Number: [id number] Patient Account Number: [id number] Date of Birth/Sex: Treating RN: 10-27-1980 (40 y.o. [name] Primary Care Kishaun Erekson: Marcy Siren Other Clinician: Referring Lashai Grosch: Treating Bora Bost/Extender: [name] in Treatment: 1 Visit Information History Since Last Visit Added or deleted any medications: No Patient Arrived: Ambulatory Any new allergies or adverse reactions: No Arrival Time: 14:48 Had a fall or experienced change in No Accompanied By: alone activities of daily living that may affect Transfer Assistance: None risk of falls: Patient Identification Verified: Yes Signs or symptoms of abuse/neglect since last visito No Secondary Verification Process Completed: Yes Hospitalized since last visit: No Patient Requires Transmission-Based Precautions: No Implantable device outside of the clinic excluding No Patient Has Alerts: No cellular tissue based products placed in the center since last visit: Has Dressing in Place as Prescribed: Yes Has Compression in Place as Prescribed: Yes Pain Present Now: No Electronic Signature(s) Signed: 09/26/2020 6:29:35 PM By: Zandra Abts RN, BSN Entered By: Zandra Abts on 09/26/2020 14:55:24 -------------------------------------------------------------------------------- Compression Therapy Details Patient Name: Date of Service: [name], Shawn Oppenheim T. 09/26/2020 2:15 PM Medical Record Number: [id number] Patient Account Number: [id number] Date of Birth/Sex: Treating RN: 1980/05/12 (40 y.o. [name], [name] Primary Care Shanielle Correll: Marcy Siren Other Clinician: Referring Sirr Kabel: Treating Tyden Kann/Extender: [name] in Treatment: 1 Compression Therapy Performed for Wound  Assessment: Wound #32 Right,Medial Lower Leg Performed By: Clinician Fonnie Mu, RN Compression Type: Four Layer Post Procedure Diagnosis Same as Pre-procedure Electronic Signature(s) Signed: 09/26/2020 5:32:12 PM By: Fonnie Mu RN Entered By: Fonnie Mu on 09/26/2020 15:16:40 -------------------------------------------------------------------------------- Encounter Discharge Information Details Patient Name: Date of Service: [name], Shawn Oppenheim T. 09/26/2020 2:15 PM Medical Record Number: [id number] Patient Account Number: [id number] Date of Birth/Sex: Treating RN: 1980/06/05 (40 y.o. [name] Primary Care Dahiana Kulak: Marcy Siren Other Clinician: Referring Janicia Monterrosa: Treating Mellina Benison/Extender: [name] in Treatment: 1 Encounter Discharge Information Items Post Procedure Vitals Discharge Condition: Stable Temperature (F): 98.7 Ambulatory Status: Ambulatory Pulse (bpm): 87 Discharge Destination: Home Respiratory Rate (breaths/min): 18 Transportation: Private Auto Blood Pressure (mmHg): 166/95 Schedule Follow-up Appointment: Yes Clinical Summary of Care: Provided on 09/26/2020 Form Type Recipient Paper Patient Patient Electronic Signature(s) Signed: 09/26/2020 4:06:35 PM By: Antonieta Iba Entered By: Antonieta Iba on 09/26/2020 16:06:35 -------------------------------------------------------------------------------- Lower Extremity Assessment Details Patient Name: Date of Service: [name], Shawn Oppenheim T. 09/26/2020 2:15 PM Medical Record Number: [id number] Patient Account Number: [id number] Date of Birth/Sex: Treating RN: 07-08-1980 (40 y.o. [name] Primary Care Bryen Hinderman: Marcy Siren Other Clinician: Referring Niklaus Mamaril: Treating Jadda Hunsucker/Extender: [name] in Treatment: 1 Edema Assessment Assessed: [name]: No] [name]: No] Edema: [Left: Ye] [Right: s] Calf Left:  Right: Point of Measurement: 36 cm From Medial Instep 58 cm Ankle Left: Right: Point of Measurement: 10 cm From Medial Instep 32.5 cm Vascular Assessment Pulses: Dorsalis Pedis Palpable: [Right:Yes] Electronic Signature(s) Signed: 09/26/2020 6:29:35 PM By: Zandra Abts RN, BSN Entered By: Zandra Abts on 09/26/2020 15:00:19 -------------------------------------------------------------------------------- Multi Wound Chart Details Patient Name: Date of Service: [name], Shawn Oppenheim T. 09/26/2020 2:15 PM Medical Record Number: [id number] Patient Account Number: [id number] Date of Birth/Sex: Treating RN: [birth year] (40 y.o. [name], [name] Primary Care Paislie Tessler: Marcy Siren Other Clinician: Referring Zaeem Kandel: Treating Hutton Pellicane/Extender: [name] in  Treatment: 1 Vital Signs Height(in): 74 Pulse(bpm): 87 Weight(lbs): 425 Blood Pressure(mmHg): 166/95 Body Mass Index(BMI): 55 Temperature(F): 98.7 Respiratory Rate(breaths/min): 18 Photos: [32:No Photos Right, Medial Lower Leg] [33:No Photos Right, Lateral Lower Leg] [N/A:N/A N/A] Wound Location: [32:Gradually Appeared] [33:Gradually Appeared] [N/A:N/A] Wounding Event: [32:Lymphedema] [33:Lymphedema] [N/A:N/A] Primary Etiology: [32:Lymphedema, Deep Vein Thrombosis,] [33:Lymphedema, Deep Vein Thrombosis,] [N/A:N/A] Comorbid History: [32:Hypertension, Peripheral Venous Disease, Type II Diabetes 07/11/2020] [33:Hypertension, Peripheral Venous Disease, Type II Diabetes 07/10/2020] [N/A:N/A] Date Acquired: [32:1] [33:1] [N/A:N/A] Weeks of Treatment: [32:Open] [33:Open] [N/A:N/A] Wound Status: [32:No] [33:Yes] [N/A:N/A] Clustered Wound: [32:N/A] [33:1] [N/A:N/A] Clustered Quantity: [32:5.7x23x0.2] [33:0.4x0.4x0.1] [N/A:N/A] Measurements L x W x D (cm) [32:102.966] [33:0.126] [N/A:N/A] A (cm) : rea [32:20.593] [33:0.013] [N/A:N/A] Volume (cm) : [32:4.70%] [33:97.40%] [N/A:N/A] % Reduction in  Area: [32:52.30%] [33:97.40%] [N/A:N/A] % Reduction in Volume: [32:Full Thickness Without Exposed] [33:Full Thickness Without Exposed] [N/A:N/A] Classification: [32:Support Structures Large] [33:Support Structures Medium] [N/A:N/A] Exudate Amount: [32:Serosanguineous] [33:Serosanguineous] [N/A:N/A] Exudate Type: [32:red, brown] [33:red, brown] [N/A:N/A] Exudate Color: [32:Yes] [33:No] [N/A:N/A] Foul Odor A Cleansing: [32:fter No] [33:N/A] [N/A:N/A] Odor Anticipated Due to Product Use: [32:Distinct, outline attached] [33:Distinct, outline attached] [N/A:N/A] Wound Margin: [32:Medium (34-66%)] [33:Large (67-100%)] [N/A:N/A] Granulation A mount: [32:Red] [33:Red] [N/A:N/A] Granulation Quality: [32:Medium (34-66%)] [33:None Present (0%)] [N/A:N/A] Necrotic Amount: [32:Fat Layer (Subcutaneous Tissue): Yes Fat Layer (Subcutaneous Tissue): Yes N/A] Exposed Structures: [32:Fascia: No Tendon: No Muscle: No Joint: No Bone: No Small (1-33%)] [33:Fascia: No Tendon: No Muscle: No Joint: No Bone: No Medium (34-66%)] [N/A:N/A] Epithelialization: [32:Debridement - Excisional] [33:Debridement - Excisional] [N/A:N/A] Debridement: Pre-procedure Verification/Time Out 15:10 [33:15:11] [N/A:N/A] Taken: [32:Lidocaine] [33:Lidocaine] [N/A:N/A] Pain Control: [32:Subcutaneous, Slough] [33:Subcutaneous, Slough] [N/A:N/A] Tissue Debrided: [32:Skin/Subcutaneous Tissue] [33:Skin/Subcutaneous Tissue] [N/A:N/A] Level: [32:131.1] [33:0.16] [N/A:N/A] Debridement A (sq cm): [32:rea Curette] [33:Curette] [N/A:N/A] Instrument: [32:Minimum] [33:Minimum] [N/A:N/A] Bleeding: [32:Pressure] [33:Pressure] [N/A:N/A] Hemostasis A chieved: [32:0] [33:0] [N/A:N/A] Procedural Pain: [32:0] [33:0] [N/A:N/A] Post Procedural Pain: [32:Procedure was tolerated well] [33:Procedure was tolerated well] [N/A:N/A] Debridement Treatment Response: [32:5.7x23x0.2] [33:0.4x0.4x0.1] [N/A:N/A] Post Debridement Measurements L x W x D (cm) [32:20.593]  [33:0.013] [N/A:N/A] Post Debridement Volume: (cm) [32:Compression Therapy] [33:Debridement] [N/A:N/A] Procedures Performed: [32:Debridement] Treatment Notes Wound #32 (Lower Leg) Wound Laterality: Right, Medial Cleanser Soap and Water Discharge Instruction: May shower and wash wound with dial antibacterial soap and water prior to dressing change. Wound Cleanser Discharge Instruction: Cleanse the wound with wound cleanser prior to applying a clean dressing using gauze sponges, not tissue or cotton balls. Peri-Wound Care Triamcinolone 15 (g) Discharge Instruction: Use triamcinolone 15 (g) as directed Sween Lotion (Moisturizing lotion) Discharge Instruction: Apply moisturizing lotion as directed Topical Primary Dressing KerraCel Ag Gelling Fiber Dressing, 4x5 in (silver alginate) Discharge Instruction: Apply silver alginate to wound bed as instructed Secondary Dressing Woven Gauze Sponge, Non-Sterile 4x4 in Discharge Instruction: Apply over primary dressing as directed. ABD Pad, 5x9 Discharge Instruction: Apply over primary dressing as directed. Zetuvit Plus 4x8 in Discharge Instruction: Apply over primary dressing as directed. Secured With Compression Wrap FourPress (4 layer compression wrap) Discharge Instruction: Apply four layer compression as directed. May also use Miliken CoFlex 2 layer compression system as alternative. Compression Stockings Add-Ons Wound #33 (Lower Leg) Wound Laterality: Right, Lateral Cleanser Soap and Water Discharge Instruction: May shower and wash wound with dial antibacterial soap and water prior to dressing change. Wound Cleanser Discharge Instruction: Cleanse the wound with wound cleanser prior to applying a clean dressing using gauze sponges, not tissue or cotton balls. Peri-Wound Care Triamcinolone 15 (g) Discharge Instruction: Use triamcinolone 15 (g) as directed Sween  Lotion (Moisturizing lotion) Discharge Instruction: Apply moisturizing  lotion as directed Topical Primary Dressing KerraCel Ag Gelling Fiber Dressing, 4x5 in (silver alginate) Discharge Instruction: Apply silver alginate to wound bed as instructed Secondary Dressing Woven Gauze Sponge, Non-Sterile 4x4 in Discharge Instruction: Apply over primary dressing as directed. ABD Pad, 5x9 Discharge Instruction: Apply over primary dressing as directed. Zetuvit Plus 4x8 in Discharge Instruction: Apply over primary dressing as directed. Secured With Compression Wrap FourPress (4 layer compression wrap) Discharge Instruction: Apply four layer compression as directed. May also use Miliken CoFlex 2 layer compression system as alternative. Compression Stockings Add-Ons Electronic Signature(s) Signed: 09/26/2020 5:24:30 PM By: Baltazar Najjar MD Signed: 09/26/2020 5:32:12 PM By: Fonnie Mu RN Entered By: Baltazar Najjar on 09/26/2020 17:13:45 -------------------------------------------------------------------------------- Multi-Disciplinary Care Plan Details Patient Name: Date of Service: [name], Shawn Oppenheim T. 09/26/2020 2:15 PM Medical Record Number: [id number] Patient Account Number: [id number] Date of Birth/Sex: Treating RN: 02-13-1981 (40 y.o. [name], [name] Primary Care Carmelia Tiner: Marcy Siren Other Clinician: Referring Ahmya Bernick: Treating Fusae Florio/Extender: [name] in Treatment: 1 Active Inactive Wound/Skin Impairment Nursing Diagnoses: Impaired tissue integrity Knowledge deficit related to ulceration/compromised skin integrity Goals: Patient will have a decrease in wound volume by X% from date: (specify in notes) Date Initiated: 09/19/2020 Target Resolution Date: 10/12/2020 Goal Status: Active Patient/caregiver will verbalize understanding of skin care regimen Date Initiated: 09/19/2020 Target Resolution Date: 12/12/2020 Goal Status: Active Ulcer/skin breakdown will have a volume reduction of 30% by week  4 Date Initiated: 09/19/2020 Target Resolution Date: 10/11/2020 Goal Status: Active Ulcer/skin breakdown will have a volume reduction of 50% by week 8 Date Initiated: 09/19/2020 Target Resolution Date: 10/08/2020 Goal Status: Active Interventions: Assess patient/caregiver ability to obtain necessary supplies Assess patient/caregiver ability to perform ulcer/skin care regimen upon admission and as needed Assess ulceration(s) every visit Notes: Electronic Signature(s) Signed: 09/26/2020 5:32:12 PM By: Fonnie Mu RN Entered By: Fonnie Mu on 09/26/2020 15:19:39 -------------------------------------------------------------------------------- Pain Assessment Details Patient Name: Date of Service: [name], Shawn Oppenheim T. 09/26/2020 2:15 PM Medical Record Number: [id number] Patient Account Number: [id number] Date of Birth/Sex: Treating RN: [birth year] (40 y.o. [name] Primary Care Derica Leiber: Marcy Siren Other Clinician: Referring Kazuma Elena: Treating Tiffanyann Deroo/Extender: [name] in Treatment: 1 Active Problems Location of Pain Severity and Description of Pain Patient Has Paino No Site Locations Pain Management and Medication Current Pain Management: Electronic Signature(s) Signed: 09/26/2020 6:29:35 PM By: Zandra Abts RN, BSN Entered By: Zandra Abts on 09/26/2020 14:55:49 -------------------------------------------------------------------------------- Patient/Caregiver Education Details Patient Name: Date of Service: [name] 7/19/2022andnbsp2:15 PM Medical Record Number: [id number] Patient Account Number: [id number] Date of Birth/Gender: Treating RN: 12/18/1980 (40 y.o. [name] Primary Care Physician: Marcy Siren Other Clinician: Referring Physician: Treating Physician/Extender: [name] in Treatment: 1 Education Assessment Education Provided  To: Patient Education Topics Provided Wound/Skin Impairment: Methods: Explain/Verbal Responses: State content correctly Electronic Signature(s) Signed: 09/26/2020 5:32:12 PM By: Fonnie Mu RN Entered By: Fonnie Mu on 09/26/2020 15:19:56 -------------------------------------------------------------------------------- Wound Assessment Details Patient Name: Date of Service: [name], Shawn Oppenheim T. 09/26/2020 2:15 PM Medical Record Number: [id number] Patient Account Number: [id number] Date of Birth/Sex: Treating RN: [birth year] (40 y.o. [name] Primary Care Carina Chaplin: Marcy Siren Other Clinician: Referring Sukari Grist: Treating Arelys Glassco/Extender: [name] in Treatment: 1 Wound Status Wound Number: 32 Primary Lymphedema Etiology: Wound Location: Right, Medial Lower Leg Wound Open Wounding Event: Gradually Appeared Status: Date Acquired: 07/11/2020  Comorbid Lymphedema, Deep Vein Thrombosis, Hypertension, Peripheral Weeks Of Treatment: 1 History: Venous Disease, Type II Diabetes Clustered Wound: No Photos Wound Measurements Length: (cm) 5.7 Width: (cm) 23 Depth: (cm) 0.2 Area: (cm) 102.966 Volume: (cm) 20.593 % Reduction in Area: 4.7% % Reduction in Volume: 52.3% Epithelialization: Small (1-33%) Tunneling: No Undermining: No Wound Description Classification: Full Thickness Without Exposed Support Structures Wound Margin: Distinct, outline attached Exudate Amount: Large Exudate Type: Serosanguineous Exudate Color: red, brown Foul Odor After Cleansing: Yes Due to Product Use: No Slough/Fibrino Yes Wound Bed Granulation Amount: Medium (34-66%) Exposed Structure Granulation Quality: Red Fascia Exposed: No Necrotic Amount: Medium (34-66%) Fat Layer (Subcutaneous Tissue) Exposed: Yes Necrotic Quality: Adherent Slough Tendon Exposed: No Muscle Exposed: No Joint Exposed: No Bone Exposed: No Treatment Notes Wound  #32 (Lower Leg) Wound Laterality: Right, Medial Cleanser Soap and Water Discharge Instruction: May shower and wash wound with dial antibacterial soap and water prior to dressing change. Wound Cleanser Discharge Instruction: Cleanse the wound with wound cleanser prior to applying a clean dressing using gauze sponges, not tissue or cotton balls. Peri-Wound Care Triamcinolone 15 (g) Discharge Instruction: Use triamcinolone 15 (g) as directed Sween Lotion (Moisturizing lotion) Discharge Instruction: Apply moisturizing lotion as directed Topical Primary Dressing KerraCel Ag Gelling Fiber Dressing, 4x5 in (silver alginate) Discharge Instruction: Apply silver alginate to wound bed as instructed Secondary Dressing Woven Gauze Sponge, Non-Sterile 4x4 in Discharge Instruction: Apply over primary dressing as directed. ABD Pad, 5x9 Discharge Instruction: Apply over primary dressing as directed. Zetuvit Plus 4x8 in Discharge Instruction: Apply over primary dressing as directed. Secured With Compression Wrap FourPress (4 layer compression wrap) Discharge Instruction: Apply four layer compression as directed. May also use Miliken CoFlex 2 layer compression system as alternative. Compression Stockings Add-Ons Electronic Signature(s) Signed: 09/26/2020 5:17:35 PM By: Karl Itoawkins, Destiny Signed: 09/26/2020 6:29:35 PM By: Zandra AbtsLynch, Shatara RN, BSN Entered By: Karl Itoawkins, Destiny on 09/26/2020 17:15:11 -------------------------------------------------------------------------------- Wound Assessment Details Patient Name: Date of Service: [name], Shawn OppenheimA DRIA N T. 09/26/2020 2:15 PM Medical Record Number: [id number] Patient Account Number: [id number] Date of Birth/Sex: Treating RN: 1980/10/07 (40 y.o. Shawn KollerM) [name], Shawn Primary Care Tanecia Mccay: Marcy SirenWallace, Catherine Other Clinician: Referring Arihant Pennings: Treating Roderica Cathell/Extender: Mikki Harborobson, Michael Wallace, Catherine Weeks in Treatment: 1 Wound Status Wound Number: 33  Primary Lymphedema Etiology: Wound Location: Right, Lateral Lower Leg Wound Open Wounding Event: Gradually Appeared Status: Date Acquired: 07/10/2020 Comorbid Lymphedema, Deep Vein Thrombosis, Hypertension, Peripheral Weeks Of Treatment: 1 History: Venous Disease, Type II Diabetes Clustered Wound: Yes Photos Wound Measurements Length: (cm) 0.4 Width: (cm) 0.4 Depth: (cm) 0.1 Clustered Quantity: 1 Area: (cm) 0.126 Volume: (cm) 0.013 % Reduction in Area: 97.4% % Reduction in Volume: 97.4% Epithelialization: Medium (34-66%) Tunneling: No Undermining: No Wound Description Classification: Full Thickness Without Exposed Support Structures Wound Margin: Distinct, outline attached Exudate Amount: Medium Exudate Type: Serosanguineous Exudate Color: red, brown Foul Odor After Cleansing: No Slough/Fibrino No Wound Bed Granulation Amount: Large (67-100%) Exposed Structure Granulation Quality: Red Fascia Exposed: No Necrotic Amount: None Present (0%) Fat Layer (Subcutaneous Tissue) Exposed: Yes Tendon Exposed: No Muscle Exposed: No Joint Exposed: No Bone Exposed: No Treatment Notes Wound #33 (Lower Leg) Wound Laterality: Right, Lateral Cleanser Soap and Water Discharge Instruction: May shower and wash wound with dial antibacterial soap and water prior to dressing change. Wound Cleanser Discharge Instruction: Cleanse the wound with wound cleanser prior to applying a clean dressing using gauze sponges, not tissue or cotton balls. Peri-Wound Care Triamcinolone 15 (g) Discharge Instruction: Use triamcinolone 15 (  g) as directed Sween Lotion (Moisturizing lotion) Discharge Instruction: Apply moisturizing lotion as directed Topical Primary Dressing KerraCel Ag Gelling Fiber Dressing, 4x5 in (silver alginate) Discharge Instruction: Apply silver alginate to wound bed as instructed Secondary Dressing Woven Gauze Sponge, Non-Sterile 4x4 in Discharge Instruction: Apply over primary  dressing as directed. ABD Pad, 5x9 Discharge Instruction: Apply over primary dressing as directed. Zetuvit Plus 4x8 in Discharge Instruction: Apply over primary dressing as directed. Secured With Compression Wrap FourPress (4 layer compression wrap) Discharge Instruction: Apply four layer compression as directed. May also use Miliken CoFlex 2 layer compression system as alternative. Compression Stockings Add-Ons Electronic Signature(s) Signed: 09/26/2020 5:17:35 PM By: Karl Ito Signed: 09/26/2020 6:29:35 PM By: Zandra Abts RN, BSN Entered By: Karl Ito on 09/26/2020 17:14:24 -------------------------------------------------------------------------------- Vitals Details Patient Name: Date of Service: [name], [name] 09/26/2020 2:15 PM Medical Record Number: [id number] Patient Account Number: [id number] Date of Birth/Sex: Treating RN: [birth year] (40 y.o. [name] Primary Care Safiya Girdler: Marcy Siren Other Clinician: Referring Ivon Oelkers: Treating Preston Weill/Extender: [name] in Treatment: 1 Vital Signs Time Taken: 14:48 Temperature (F): 98.7 Height (in): 74 Pulse (bpm): 87 Weight (lbs): 425 Respiratory Rate (breaths/min): 18 Body Mass Index (BMI): 54.6 Blood Pressure (mmHg): 166/95 Reference Range: 80 - 120 mg / dl Electronic Signature(s) Signed: 09/26/2020 6:29:35 PM By: Zandra Abts RN, BSN Entered By: Zandra Abts on 09/26/2020 14:55:42

## 2020-10-02 ENCOUNTER — Telehealth: Payer: Self-pay | Admitting: Cardiovascular Disease

## 2020-10-02 NOTE — Telephone Encounter (Signed)
Pt c/o swelling: STAT is pt has developed SOB within 24 hours  How much weight have you gained and in what time span?  Patient states he assumes he gained weight, but he doesn't record his daily weights  If swelling, where is the swelling located?  Left leg  Are you currently taking a fluid pill? yes, patient take spironolactone (ALDACTONE) 25 MG tablet and furosemide (LASIX) 80 MG tablet as prescribed  Are you currently SOB?  No   Do you have a log of your daily weights (if so, list)?  No log available  Have you gained 3 pounds in a day or 5 pounds in a week?  Unsure   Have you traveled recently?  No

## 2020-10-02 NOTE — Telephone Encounter (Signed)
Returned the call to the patient. He was last seen in 2020.  He has been having lower extremity swelling bilaterally for the past two weeks. He does not weigh himself daily and denies shortness of breath. He stated that the swelling does get better overnight. He has been advised to elevate his legs when possible and to watch his sodium intake.   He takes Furosemide 80 mg twice daily and Spironolactone 25 mg once daily.  Patient has an appointment on 7/29

## 2020-10-03 ENCOUNTER — Encounter (HOSPITAL_BASED_OUTPATIENT_CLINIC_OR_DEPARTMENT_OTHER): Payer: 59 | Admitting: Internal Medicine

## 2020-10-03 ENCOUNTER — Other Ambulatory Visit: Payer: Self-pay

## 2020-10-03 DIAGNOSIS — I87331 Chronic venous hypertension (idiopathic) with ulcer and inflammation of right lower extremity: Secondary | ICD-10-CM | POA: Diagnosis not present

## 2020-10-03 NOTE — Progress Notes (Signed)
Lundblad, Swade [name] ([id number]) . Visit Report for 10/03/2020 Arrival Information Details Patient Name: Date of Service: [name], [name]. 10/03/2020 2:15 PM Medical Record Number: [id number] Patient Account Number: [id number] Date of Birth/Sex: Treating RN: Jul 24, 1980 (40 y.o. Damaris SchoonerM) Boehlein, Linda Primary Care Mickey Esguerra: Marcy SirenWallace, Catherine Other Clinician: Referring Jhanvi Drakeford: Treating Mykell Genao/Extender: Mikki Harborobson, Michael Wallace, Catherine Weeks in Treatment: 2 Visit Information History Since Last Visit Added or deleted any medications: No Patient Arrived: Ambulatory Any new allergies or adverse reactions: No Arrival Time: 14:28 Had Shawn fall or experienced change in No Accompanied By: self activities of daily living that may affect Transfer Assistance: None risk of falls: Patient Identification Verified: Yes Signs or symptoms of abuse/neglect since last visito No Secondary Verification Process Completed: Yes Hospitalized since last visit: No Patient Requires Transmission-Based Precautions: No Implantable device outside of the clinic excluding No Patient Has Alerts: No cellular tissue based products placed in the center since last visit: Has Dressing in Place as Prescribed: Yes Has Compression in Place as Prescribed: Yes Pain Present Now: Yes Electronic Signature(s) Signed: 10/03/2020 5:08:38 PM By: Zenaida DeedBoehlein, Linda RN, BSN Entered By: Zenaida DeedBoehlein, Linda on 10/03/2020 14:30:42 -------------------------------------------------------------------------------- Encounter Discharge Information Details Patient Name: Date of Service: [name], [name]. 10/03/2020 2:15 PM Medical Record Number: [id number] Patient Account Number: [id number] Date of Birth/Sex: Treating RN: Jul 24, 1980 (40 y.o. Tammy SoursM) Deaton, Bobbi Primary Care Zahrah Sutherlin: Marcy SirenWallace, Catherine Other Clinician: Referring Meric Joye: Treating Valetta Mulroy/Extender: Mikki Harborobson, Michael Wallace, Catherine Weeks in Treatment: 2 Encounter Discharge  Information Items Post Procedure Vitals Discharge Condition: Stable Temperature (F): 98.5 Ambulatory Status: Ambulatory Pulse (bpm): 93 Discharge Destination: Home Respiratory Rate (breaths/min): 20 Transportation: Private Auto Blood Pressure (mmHg): 148/85 Accompanied By: self Schedule Follow-up Appointment: Yes Clinical Summary of Care: Electronic Signature(s) Signed: 10/03/2020 5:46:34 PM By: Shawn Stalleaton, Bobbi Entered By: Shawn Stalleaton, Bobbi on 10/03/2020 17:20:45 -------------------------------------------------------------------------------- Lower Extremity Assessment Details Patient Name: Date of Service: [name], [name]. 10/03/2020 2:15 PM Medical Record Number: [id number] Patient Account Number: [id number] Date of Birth/Sex: Treating RN: Jul 24, 1980 (40 y.o. Damaris SchoonerM) Boehlein, Linda Primary Care Maxcine Strong: Marcy SirenWallace, Catherine Other Clinician: Referring Shekina Cordell: Treating Birttany Dechellis/Extender: Mikki Harborobson, Michael Wallace, Catherine Weeks in Treatment: 2 Edema Assessment Assessed: Kyra Searles[Left: No] Franne Forts[Right: No] Edema: [Left: Ye] [Right: s] Calf Left: Right: Point of Measurement: 36 cm From Medial Instep 57.5 cm Ankle Left: Right: Point of Measurement: 10 cm From Medial Instep 34 cm Vascular Assessment Pulses: Dorsalis Pedis Palpable: [Right:Yes] Electronic Signature(s) Signed: 10/03/2020 5:08:38 PM By: Zenaida DeedBoehlein, Linda RN, BSN Entered By: Zenaida DeedBoehlein, Linda on 10/03/2020 14:42:02 -------------------------------------------------------------------------------- Multi Wound Chart Details Patient Name: Date of Service: [name], [name]. 10/03/2020 2:15 PM Medical Record Number: [id number] Patient Account Number: [id number] Date of Birth/Sex: Treating RN: Jul 24, 1980 (40 y.o. Charlean MerlM) Breedlove, [name] Primary Care Anniemae Haberkorn: Marcy SirenWallace, Catherine Other Clinician: Referring Davetta Olliff: Treating Brayli Klingbeil/Extender: Mikki Harborobson, Michael Wallace, Catherine Weeks in Treatment: 2 Vital Signs Height(in): 74 Pulse(bpm):  93 Weight(lbs): 425 Blood Pressure(mmHg): 148/85 Body Mass Index(BMI): 55 Temperature(F): 98.5 Respiratory Rate(breaths/min): 20 Photos: [N/Shawn:N/Shawn] Right, Medial Lower Leg Right, Lateral Lower Leg N/Shawn Wound Location: Gradually Appeared Gradually Appeared N/Shawn Wounding Event: Lymphedema Lymphedema N/Shawn Primary Etiology: Lymphedema, Deep Vein Thrombosis, Lymphedema, Deep Vein Thrombosis, N/Shawn Comorbid History: Hypertension, Peripheral Venous Hypertension, Peripheral Venous Disease, Type II Diabetes Disease, Type II Diabetes 07/11/2020 07/10/2020 N/Shawn Date Acquired: 2 2 N/Shawn Weeks of Treatment: Open Open N/Shawn Wound Status: No Yes N/Shawn Clustered Wound: N/Shawn 1 N/Shawn Clustered Quantity: 5.7x23.5x0.5 0.3x0.3x0.1 N/Shawn Measurements L x W x  D (cm) 105.204 0.071 N/Shawn Shawn (cm) : rea 52.602 0.007 N/Shawn Volume (cm) : 2.60% 98.60% N/Shawn % Reduction in Area: -21.80% 98.60% N/Shawn % Reduction in Volume: Full Thickness Without Exposed Full Thickness Without Exposed N/Shawn Classification: Support Structures Support Structures Large Small N/Shawn Exudate Amount: Purulent Serosanguineous N/Shawn Exudate Type: yellow, brown, green red, brown N/Shawn Exudate Color: Yes No N/Shawn Foul Odor Shawn Cleansing: fter No N/Shawn N/Shawn Odor Anticipated Due to Product Use: Distinct, outline attached Flat and Intact N/Shawn Wound Margin: Small (1-33%) Large (67-100%) N/Shawn Granulation Shawn mount: Red Red N/Shawn Granulation Quality: Large (67-100%) None Present (0%) N/Shawn Necrotic Amount: Fat Layer (Subcutaneous Tissue): Yes Fat Layer (Subcutaneous Tissue): Yes N/Shawn Exposed Structures: Fascia: No Fascia: No Tendon: No Tendon: No Muscle: No Muscle: No Joint: No Joint: No Bone: No Bone: No Small (1-33%) Medium (34-66%) N/Shawn Epithelialization: Debridement - Excisional N/Shawn N/Shawn Debridement: Pre-procedure Verification/Time Out 15:22 N/Shawn N/Shawn Taken: Lidocaine N/Shawn N/Shawn Pain Control: Subcutaneous, Slough N/Shawn N/Shawn Tissue  Debrided: Skin/Subcutaneous Tissue N/Shawn N/Shawn Level: 133.95 N/Shawn N/Shawn Debridement Shawn (sq cm): rea Curette N/Shawn N/Shawn Instrument: Minimum N/Shawn N/Shawn Bleeding: Pressure N/Shawn N/Shawn Hemostasis Shawn chieved: 0 N/Shawn N/Shawn Procedural Pain: 0 N/Shawn N/Shawn Post Procedural Pain: Procedure was tolerated well N/Shawn N/Shawn Debridement Treatment Response: 5.7x23.5x0.5 N/Shawn N/Shawn Post Debridement Measurements L x W x D (cm) 52.602 N/Shawn N/Shawn Post Debridement Volume: (cm) Debridement N/Shawn N/Shawn Procedures Performed: Treatment Notes Electronic Signature(s) Signed: 10/03/2020 5:43:33 PM By: Baltazar Najjar MD Signed: 10/03/2020 5:55:16 PM By: Fonnie Mu RN Entered By: Baltazar Najjar on 10/03/2020 17:01:16 -------------------------------------------------------------------------------- Multi-Disciplinary Care Plan Details Patient Name: Date of Service: Shawn Gong, Shawn Oppenheim [name]. 10/03/2020 2:15 PM Medical Record Number: [id number] Patient Account Number: [id number] Date of Birth/Sex: Treating RN: 10-06-1980 (40 y.o. [name], [name] Primary Care Katreena Schupp: Marcy Siren Other Clinician: Referring Deon Ivey: Treating Polette Nofsinger/Extender: [name] in Treatment: 2 Active Inactive Wound/Skin Impairment Nursing Diagnoses: Impaired tissue integrity Knowledge deficit related to ulceration/compromised skin integrity Goals: Patient will have Shawn decrease in wound volume by X% from date: (specify in notes) Date Initiated: 09/19/2020 Target Resolution Date: 10/12/2020 Goal Status: Active Patient/caregiver will verbalize understanding of skin care regimen Date Initiated: 09/19/2020 Target Resolution Date: 12/12/2020 Goal Status: Active Ulcer/skin breakdown will have Shawn volume reduction of 30% by week 4 Date Initiated: 09/19/2020 Target Resolution Date: 10/11/2020 Goal Status: Active Ulcer/skin breakdown will have Shawn volume reduction of 50% by week 8 Date Initiated: 09/19/2020 Target Resolution Date:  10/08/2020 Goal Status: Active Interventions: Assess patient/caregiver ability to obtain necessary supplies Assess patient/caregiver ability to perform ulcer/skin care regimen upon admission and as needed Assess ulceration(s) every visit Notes: Electronic Signature(s) Signed: 10/03/2020 5:55:16 PM By: Fonnie Mu RN Entered By: Fonnie Mu on 10/03/2020 15:18:26 -------------------------------------------------------------------------------- Pain Assessment Details Patient Name: Date of Service: Shawn Bering [name]. 10/03/2020 2:15 PM Medical Record Number: [id number] Patient Account Number: [id number] Date of Birth/Sex: Treating RN: [birth year] (40 y.o. [name] Primary Care Elan Brainerd: Marcy Siren Other Clinician: Referring Jasline Buskirk: Treating Mailen Newborn/Extender: [name] in Treatment: 2 Active Problems Location of Pain Severity and Description of Pain Patient Has Paino Yes Site Locations Pain Location: Pain in Ulcers With Dressing Change: Yes Duration of the Pain. Constant / Intermittento Intermittent Rate the pain. Current Pain Level: 6 Worst Pain Level: 7 Least Pain Level: 0 Character of Pain Describe the Pain: Stabbing Pain Management and Medication Current Pain Management: Medication: Yes Other: time Rest: Yes Is  the Current Pain Management Adequate: Adequate How does your wound impact your activities of daily livingo Sleep: No Bathing: No Appetite: No Relationship With Others: No Bladder Continence: No Emotions: No Bowel Continence: No Work: No Toileting: No Drive: No Dressing: No Hobbies: No Electronic Signature(s) Signed: 10/03/2020 5:08:38 PM By: Zenaida Deed RN, BSN Entered By: Zenaida Deed on 10/03/2020 14:41:39 -------------------------------------------------------------------------------- Patient/Caregiver Education Details Patient Name: Date of Service: Shawn Crane  7/26/2022andnbsp2:15 PM Medical Record Number: [id number] Patient Account Number: [id number] Date of Birth/Gender: Treating RN: February 02, 1981 (40 y.o. [name] Primary Care Physician: Marcy Siren Other Clinician: Referring Physician: Treating Physician/Extender: [name] in Treatment: 2 Education Assessment Education Provided To: Patient Education Topics Provided Wound/Skin Impairment: Methods: Explain/Verbal Responses: State content correctly Electronic Signature(s) Signed: 10/03/2020 5:55:16 PM By: Fonnie Mu RN Entered By: Fonnie Mu on 10/03/2020 15:20:02 -------------------------------------------------------------------------------- Wound Assessment Details Patient Name: Date of Service: Shawn Bering [name]. 10/03/2020 2:15 PM Medical Record Number: [id number] Patient Account Number: [id number] Date of Birth/Sex: Treating RN: 05-11-1980 (40 y.o. [name] Primary Care Itzel Lowrimore: Marcy Siren Other Clinician: Referring Mossie Gilder: Treating Joselin Crandell/Extender: [name] in Treatment: 2 Wound Status Wound Number: [id number] Primary Lymphedema Etiology: Wound Location: Right, Medial Lower Leg Wound Open Wounding Event: Gradually Appeared Status: Date Acquired: 07/11/2020 Comorbid Lymphedema, Deep Vein Thrombosis, Hypertension, Peripheral Weeks Of Treatment: 2 History: Venous Disease, Type II Diabetes Clustered Wound: No Photos Wound Measurements Length: (cm) 5.7 Width: (cm) 23.5 Depth: (cm) 0.5 Area: (cm) 105.204 Volume: (cm) 52.602 % Reduction in Area: 2.6% % Reduction in Volume: -21.8% Epithelialization: Small (1-33%) Tunneling: No Undermining: No Wound Description Classification: Full Thickness Without Exposed Support Structures Wound Margin: Distinct, outline attached Exudate Amount: Large Exudate Type: Purulent Exudate Color: yellow, brown, green Foul Odor  After Cleansing: Yes Due to Product Use: No Slough/Fibrino Yes Wound Bed Granulation Amount: Small (1-33%) Exposed Structure Granulation Quality: Red Fascia Exposed: No Necrotic Amount: Large (67-100%) Fat Layer (Subcutaneous Tissue) Exposed: Yes Necrotic Quality: Adherent Slough Tendon Exposed: No Muscle Exposed: No Joint Exposed: No Bone Exposed: No Treatment Notes Wound #32 (Lower Leg) Wound Laterality: Right, Medial Cleanser Soap and Water Discharge Instruction: May shower and wash wound with dial antibacterial soap and water prior to dressing change. Wound Cleanser Discharge Instruction: Cleanse the wound with wound cleanser prior to applying Shawn clean dressing using gauze sponges, not tissue or cotton balls. Peri-Wound Care Triamcinolone 15 (g) Discharge Instruction: Use triamcinolone 15 (g) as directed Sween Lotion (Moisturizing lotion) Discharge Instruction: Apply moisturizing lotion as directed Topical Gentamicin Discharge Instruction: As directed by physician Primary Dressing KerraCel Ag Gelling Fiber Dressing, 4x5 in (silver alginate) Discharge Instruction: Apply silver alginate to wound bed as instructed Secondary Dressing Woven Gauze Sponge, Non-Sterile 4x4 in Discharge Instruction: Apply over primary dressing as directed. ABD Pad, 5x9 Discharge Instruction: Apply over primary dressing as directed. Zetuvit Plus 4x8 in Discharge Instruction: Apply over primary dressing as directed. Secured With Compression Wrap FourPress (4 layer compression wrap) Discharge Instruction: Apply four layer compression as directed. May also use Miliken CoFlex 2 layer compression system as alternative. Compression Stockings Add-Ons Electronic Signature(s) Signed: 10/03/2020 5:08:38 PM By: Zenaida Deed RN, BSN Entered By: Zenaida Deed on 10/03/2020 14:49:03 -------------------------------------------------------------------------------- Wound Assessment Details Patient  Name: Date of Service: [name], Shawn Oppenheim [name]. 10/03/2020 2:15 PM Medical Record Number: [id number] Patient Account Number: [id number] Date of Birth/Sex: Treating RN: June 12, 1980 (40 y.o. [name] Primary  Care Ailyn Gladd: Marcy Siren Other Clinician: Referring Jewel Mcafee: Treating Johnnie Goynes/Extender: [name] in Treatment: 2 Wound Status Wound Number: 33 Primary Lymphedema Etiology: Wound Location: Right, Lateral Lower Leg Wound Open Wounding Event: Gradually Appeared Status: Date Acquired: 07/10/2020 Comorbid Lymphedema, Deep Vein Thrombosis, Hypertension, Peripheral Weeks Of Treatment: 2 History: Venous Disease, Type II Diabetes Clustered Wound: Yes Photos Wound Measurements Length: (cm) 0.3 Width: (cm) 0.3 Depth: (cm) 0.1 Clustered Quantity: 1 Area: (cm) 0. Volume: (cm) 0. % Reduction in Area: 98.6% % Reduction in Volume: 98.6% Epithelialization: Medium (34-66%) Tunneling: No 071 Undermining: No 007 Wound Description Classification: Full Thickness Without Exposed Support Structures Wound Margin: Flat and Intact Exudate Amount: Small Exudate Type: Serosanguineous Exudate Color: red, brown Foul Odor After Cleansing: No Slough/Fibrino No Wound Bed Granulation Amount: Large (67-100%) Exposed Structure Granulation Quality: Red Fascia Exposed: No Necrotic Amount: None Present (0%) Fat Layer (Subcutaneous Tissue) Exposed: Yes Tendon Exposed: No Muscle Exposed: No Joint Exposed: No Bone Exposed: No Treatment Notes Wound #33 (Lower Leg) Wound Laterality: Right, Lateral Cleanser Soap and Water Discharge Instruction: May shower and wash wound with dial antibacterial soap and water prior to dressing change. Wound Cleanser Discharge Instruction: Cleanse the wound with wound cleanser prior to applying Shawn clean dressing using gauze sponges, not tissue or cotton balls. Peri-Wound Care Triamcinolone 15 (g) Discharge Instruction:  Use triamcinolone 15 (g) as directed Sween Lotion (Moisturizing lotion) Discharge Instruction: Apply moisturizing lotion as directed Topical Gentamicin Discharge Instruction: As directed by physician Primary Dressing KerraCel Ag Gelling Fiber Dressing, 4x5 in (silver alginate) Discharge Instruction: Apply silver alginate to wound bed as instructed Secondary Dressing Woven Gauze Sponge, Non-Sterile 4x4 in Discharge Instruction: Apply over primary dressing as directed. ABD Pad, 5x9 Discharge Instruction: Apply over primary dressing as directed. Zetuvit Plus 4x8 in Discharge Instruction: Apply over primary dressing as directed. Secured With Compression Wrap FourPress (4 layer compression wrap) Discharge Instruction: Apply four layer compression as directed. May also use Miliken CoFlex 2 layer compression system as alternative. Compression Stockings Add-Ons Electronic Signature(s) Signed: 10/03/2020 5:08:38 PM By: Zenaida Deed RN, BSN Entered By: Zenaida Deed on 10/03/2020 14:50:21 -------------------------------------------------------------------------------- Vitals Details Patient Name: Date of Service: [name], [name]. 10/03/2020 2:15 PM Medical Record Number: [id number] Patient Account Number: [id number] Date of Birth/Sex: Treating RN: 06/19/1980 (40 y.o. [name] Primary Care Uma Jerde: Marcy Siren Other Clinician: Referring Dailey Buccheri: Treating Ophie Burrowes/Extender: [name] in Treatment: 2 Vital Signs Time Taken: 14:31 Temperature (F): 98.5 Height (in): 74 Pulse (bpm): 93 Source: Stated Respiratory Rate (breaths/min): 20 Weight (lbs): 425 Blood Pressure (mmHg): 148/85 Source: Stated Reference Range: 80 - 120 mg / dl Body Mass Index (BMI): 54.6 Electronic Signature(s) Signed: 10/03/2020 5:08:38 PM By: Zenaida Deed RN, BSN Entered By: Zenaida Deed on 10/03/2020 14:32:01

## 2020-10-03 NOTE — Progress Notes (Signed)
Schoening, Rawley T ([id number]) . Visit Report for 10/03/2020 Debridement Details Patient Name: Date of Service: [name], Shawn DRIA N T. 10/03/2020 2:15 PM Medical Record Number: [id number] Patient Account Number: [id number] Date of Birth/Sex: Treating RN: 10-Aug-1980 (40 y.o. Charlean MerlM) Breedlove, [name] Primary Care Provider: Marcy SirenWallace, Catherine Other Clinician: Referring Provider: Treating Provider/Extender: Mikki Harborobson, Linard Daft Wallace, Catherine Weeks in Treatment: 2 Debridement Performed for Assessment: Wound #32 Right,Medial Lower Leg Performed By: Physician Maxwell Caulobson, Morgana Rowley G., MD Debridement Type: Debridement Level of Consciousness (Pre-procedure): Awake and Alert Pre-procedure Verification/Time Out Yes - 15:22 Taken: Start Time: 15:22 Pain Control: Lidocaine T Area Debrided (L x W): otal 5.7 (cm) x 5 (cm) = 28.5 (cm) Tissue and other material debrided: Viable, Non-Viable, Slough, Subcutaneous, Skin: Dermis , Skin: Epidermis, Slough Level: Skin/Subcutaneous Tissue Debridement Description: Excisional Instrument: Curette Specimen: Tissue Culture Number of Specimens T aken: 1 Bleeding: Minimum Hemostasis Achieved: Pressure End Time: 15:23 Procedural Pain: 0 Post Procedural Pain: 0 Response to Treatment: Procedure was tolerated well Level of Consciousness (Post- Awake and Alert procedure): Post Debridement Measurements of Total Wound Length: (cm) 5.7 Width: (cm) 23.5 Depth: (cm) 0.5 Volume: (cm) 52.602 Character of Wound/Ulcer Post Debridement: Improved Post Procedure Diagnosis Same as Pre-procedure Electronic Signature(s) Signed: 10/03/2020 5:43:33 PM By: Baltazar Najjarobson, Ameisha Mcclellan MD Signed: 10/03/2020 5:55:16 PM By: Fonnie MuBreedlove, [name] RN Entered By: Baltazar Najjarobson, Joquan Lotz on 10/03/2020 17:02:06 -------------------------------------------------------------------------------- HPI Details Patient Name: Date of Service: Shawn GongLO [name], Shawn OppenheimA DRIA N T. 10/03/2020 2:15 PM Medical Record Number: [id number] Patient Account  Number: [id number] Date of Birth/Sex: Treating RN: 10-Aug-1980 (40 y.o. Lucious GrovesM) Breedlove, [name] Primary Care Provider: Marcy SirenWallace, Catherine Other Clinician: Referring Provider: Treating Provider/Extender: Mikki Harborobson, Brittony Billick Wallace, Catherine Weeks in Treatment: 2 History of Present Illness Location: Patient presents with Shawn wound to right lower leg. Quality: very chronic but clean Duration: over 1 year Modifying Factors: r iliac obs or absence, obesity HPI Description: The patient is Shawn 40 yrs old bm here for evaluation of his right leg ulcer. He has an extensive history of lymphedema and ulcers. [facility] Wiliam KeArkin with Roland RackUnna boots and [facility] He has pumps at home but he is only using them once Shawn day. 08/30/14 selective debridement done of surface eschar. The wound cleans up quite nicely in the bed of this looks healthy. He is using Shawn wound VAC under an Unna wrap. 11/17/14; selective debridement done of surface eschar. Once again the wound beds at clean up quite nicely. He is no longer using Shawn wound VAC. Recent dressing changes include Hydrofera Blue. Apparently he has done Shawn wide variety of different topical dressings including advanced treatment options like Apligraf's without success. 03/21/15; surgical debridement done of surface eschar and nonviable subcutaneous tissue. The area on the medial aspect of the right leg has closed and the wound on the lateral right leg looks improved has been using Silver Collagen 04/11/15; I think attendance here is sporadic. The area on the right medial leg remains healed. He has Shawn new tiny area on the back of the right leg. Again Shawn surgical debridement of the surface eschar and nonviable subcutaneous tissue of the major wound on the right lateral leg. He has been using Silver Collagen and at home, he does his own Unna wraps at home edema control seems reasonable. 04/20/15; the area on the right medial leg has Shawn very superficial area which may not  even be open nevertheless I felt needed to be dressed today [this is his original chronic  wound] the new wound is on the right anterior lateral leg is underwent Shawn surgical debridement. He has Shawn small wound on the right posterior leg. He puts on his own Unna boots, according to our nurses he does this fairly well. We have been using Silver collagen. 05/02/2015 -- I understand in the past his venous studies have been done at [facility] and he was advised weight loss before they would attempt to tackle his iliac vein blockage. He has not gone back for review. 06/27/2015 -- we have applied for Apligraf and are awaiting his insurance clearance. 07/25/2015 -- he still has to use her back from his insurance agent regarding his copayment for his Apligraf. 07/31/2015 -- the patient got Shawn reply from the insurance agent regarding the dollar payment for his application but is not sure whether it is for 5 or for one. 11/28/2015 -- has been hurting Shawn little bit more and he has had change in color of his wound especially on the medial part. 12/05/2015 -- his culture was positive for Proteus mirabilis and K Oxytoca which are sensitive to ciprofloxacin which he is already on. 10/3/17still on ciprofloxacin. His mother reports of dressing had to be changed last Friday due to odor. He has not been systemically unwell 12/19/15; patient came in today complaining of increasing pain and tightness in his upper right thigh. He completed the ciprofloxacin 2 or 3 days ago. He is not running Shawn fever today. 12/26/2015 -- last week he had been seen by Dr. [name] who got Shawn lower extremity venous duplex evaluation done which did not show DVT or SVT in the right lower extremity. he had also put him on Augmentin in addition to the previous ciprofloxacin and he had received for 2 weeks 01/02/2016 -- -- was admitted to the hospital on 12/26/2015 and discharged on 12/29/2015 and was treated for cellulitis. He was also newly diagnosed  with type 2 diabetes mellitus and outpatient monitoring and initiation of treatment was recommended. Patients hemoglobin A1c was 6.6 No osteomyelitis detected on x-rays and recent Doppler study was negative for DVT Oral doxycycline and Levaquin were recommended for the patient as an . outpatient for Shawn 14 day treatment. IV Zosyn and vancomycin was given due to concerns of Pseudomonas infection while he was in hospital. 02/13/2016 -- he has not gone back to [facility] where he had his vascular opinion earlier and I have urged him to regroup with them to see if there is any surgical options available. 02/27/2016 -- has an appointment to see [facility] vascular surgeons on January 3 and may be seeing Dr. Verdie Drown 03/19/2016 -- I was not able to find any notes on Epic but the patient did go to the vascular clinic at [facility] and saw the PA to Dr. Verdie Drown. I understand she has recommended Shawn CT scan and MRI to be done on February 1 and they would review this with him on the same day. 04/09/2016 -- was admitted to the hospital on 03/20/2016 acute respiratory failure with hypoxia, abdominal discomfort with erythema, hypertensive urgency and chronic wound of the right leg with morbid obesity. he was discharged home on 04/05/2016 and was to continue on IV antibiotics for 18 more days follow-up with the wound center and continue with his cardiologist. he has lost approximately 130 pounds and diuresed over 48 L. He was treated with IV Zosyn and ID recommended he continue this for 3 weeks more. The notes from [facility]  wound clinic were noted where he was seen by the PA and she had taken cultures which grew MSSA and Pseudomonas. She had recommended edema control with Shawn 4-layer compression and also referred him to the lymphedema clinic. Shawn CT venogram was also planned for the future. 04/16/2016 -- at [facility] Shawn CT pelvic venogram runoff was done on 04/11/2016 -- IMPRESSION:-Limited study secondary to  poor opacification of venous structures. No definite evidence of acute deep vein thrombosis. 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 -Extensive right iliac chain and right inguinal adenopathy, favored to be reactive/secondary to congestion. The patient continues on his IV antibiotics through his PICC line and has his cardiology opinion and also Shawn bariatric surgery opinion coming up. 04/30/2016 -- He has completed his IV antibiotics through his PICC line. 05/14/2016 --he was seen by the PA, Ms Sluss, recommended alternate day dressing with compression and use Hibiclens and Dakin's solution with acetic acid on his wounds. 07/30/2016 -- he has still not got his custom-made compression stockings and I have urged him to go and get him self measured for these. 08/06/2016 -- he has been measured for his custom stocking and will get in 2 weeks. He has lost 20 pounds since March 08/27/2016 -- he has not got his custom stockings yet and he tried another pair which has not fit well and he has had Shawn lot of maceration increase the size of his wound. 09/03/2016 -- the patient says that he has not been very compliant with his dressing changes and his elevation and exercise and this week he is notices wound get rather large with maceration. 09/18/16; according to our intake nurse the measurements on this patient's wound are slightly larger. I note previous compliance concerns. I note that his wounds measured larger last week which was noted by Dr. Meyer Russel. Culture done last week was negative. 10/01/2016 -- the patient has received some lymphedema pumps which are new and he says he is being compliant with this. He is going to be away for 2 weeks on vacation to [facility] 10/15/2016 -- he returns after 2 weeks and tells me he has been diligent with his dressing and has lost 25 pounds over the last 3 months. 10/22/2016  -- his last hemoglobin A1c was 6.2 and he is now diet controlled. 11/19/2016 -- he has been having problems with his compression stockings which are custom made at [facility]. He has not yet been able to get them. He had taken out his compression because he had gone there and had no dressing on when he came here today READMISSION 05/22/2018 [name] is Shawn now 40 year old man with Shawn Say history of wounds in his lower extremities secondary to chronic venous insufficiency with secondary lymphedema. He has had previous vein ligations on the right although I do not have this information in front of me. As I understand things he also has central venous obstruction with Shawn vein bypass in 2005 I believe this was done at [facility]. He tells me over the last 2 months he has noted mid reopening in the right mid anterior lower extremity. This is Shawn rectangular shaped wound which is kind of odd in terms of how that formed. He has been using silver alginate and Unna boots that we used on him when he was here in 2018. He buys his product supply online thinks this is cheaper than ordering through intermediary's Past medical history;  CHF, morbid obesity, hypertension, hyperlipidemia, chronic venous insufficiency, vein bypass in 2005 previous vein ablations or ligations. He has Shawn stent in the right lower extremity. ABI in this clinic was 1.04. He is using his compression pumps at home 3/27; 2-week follow-up. Right lower extremity wounds related to chronic venous insufficiency and lymphedema. He has been using silver alginate under an Radio broadcast assistant. He change the [employer] himself at home last week. He is making nice progress 4/10; 2-week follow-up. Right lower extremity wound is just about closed Shawn small open area in the middle of the and large rectangular wound he had at the beginning. His edema control is excellent. He says he is using his compression pumps religiously 4/17; 2-week follow-up. Right lower extremity wound  is totally closed. He had Shawn large rectangular wound which is progressively closed. His edema control is very good. He is using his compression pumps once to twice Shawn day READMISSION 09/19/2020 [name] is now Shawn 40 year old man we have had for several stays in this clinic including 2017, 2018 and most recently in 2020 with Shawn wound on his right lower leg. He has compression pumps which he claims to be using twice Shawn day. He also has stockings however I am not sure how much he uses them. Things have broken down over the last month or 2 he has an extensive wound area on the right mid tibial area I think this is similar to what he has had in the past. He has been using Xeroform and an Ace wrap that his girlfriend is applying. Past medical history; the patient has known chronic venous insufficiency with secondary lymphedema. He had an iliac vein blockage and he had Shawn bypass at [facility] although the patient is not followed up with them. The surgeon may have been Dr. Jacolyn Reedy. As noted he has lymphedema pumps. Type 2 diabetes congestive heart failure obstructive sleep apnea. He has Shawn history of Shawn right leg DVT although this may have been the iliac vein he is not currently on anticoagulation ABI in our clinic is 1.37 on the right Imaging Results - in this encounter CT Pelvic Venogram Run Off Imaging Results - CT Pelvic Venogram Run Off Impressions Performed At -Limited study secondary to poor opacification of venous structures. Nodefinite evidence of acute deep vein thrombosis. 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 -Extensive right iliac chain and right inguinal adenopathy, favored to bereactive/secondary to congestion. [facility] Imaging Results - CT Pelvic Venogram Run Off Narrative Performed At EXAM: CT PELVIC VENOGRAM RUN OFF DATE: 04/11/2016 1:28 PM ACCESSION: [id number] UN DICTATED: 04/11/2016 2:18  PM INTERPRETATION LOCATION: [facility] CLINICAL INDICATION: 40 years old Male with h/o RLE DVT and massive lymphedema of both LE's. Eval for venous outflow obstruction vs extrinsic mass- L97.203-Skin ulcer of calf with necrosis of muscle, unspecified laterality(RAF-HCC) COMPARISON: CT abdomen pelvis dated 02/11/2007 TECHNIQUE: Shawn spiral CT scan was obtained approximately 3 minutes after administration of IV contrast from the kidneys to the knees.Images were reconstructed in the axial plane. Multiplanar reformatted and MIP images were provided for further evaluation of the vessels. For selected cases, 3D volumerendered images are also provided. VASCULAR FINDINGS: There is overall poor contrast opacification of the venous structures, limitingevaluation. IVC: No evidence of thrombus. Iliac veins: The right iliac veins are chronically occluded/diminutive in size. There are extensive venous collaterals noted throughout the right pelvis extending into the right lower extremity. The number and  caliber of the venous collaterals have increased when compared to 02/11/2007. The left iliac veins appear patent. There are also small caliber venous collaterals within the left pelvis extending into the left lower extremity. Anterior abdominal wall venouscollaterals are also noted but fully imaged secondary to patient diameter. The bilateral femoral and popliteal veins appear patent. There is no evidence of acute thrombus. The arterial vasculature is unremarkable on this venous phase time study. NONVASCULAR FINDINGS: LOWER CHEST Unremarkable. : ABDOMEN: HEPATOBILIARY: Unremarkable liver. No biliary ductal dilatation. Gallbladder isremarkable. PANCREAS: Unremarkable. SPLEEN: Unremarkable. ADRENAL GLANDS: Unremarkable. KIDNEYS/URETERS: Unremarkable. BLADDER: Unremarkable. BOWEL/PERITONEUM/RETROPERITONEUM: No bowel obstruction. No acute inflammatoryprocess. No ascites. LYMPH NODES: Extensive right iliac chain  and right inguinal adenopathy. Shawn nodal conglomerate in the right inguinal region measures 6.8 x 3.4 cm (2:124). This is only slightly larger when compared to 02/11/2007. Given interval stabilityover 10 years, these are favored to be reactive/secondary to congestion. REPRODUCTIVE: Unremarkable. BONES/SOFT TISSUES: No acute osseous abnormalities. Postsurgical changes in the right inguinal and left medial thigh region. Right greater than left softtissue edema throughout the lower extremities. [facility] Imaging Results - CT Pelvic Venogram Run Off Procedure Note Interface, Rad Results In - 04/11/2016 3:28 PM EST EXAM: CT PELVIC VENOGRAM RUN OFF DATE: 04/11/2016 1:28 PM ACCESSION: [id number] UN DICTATED: 04/11/2016 2:18 PM INTERPRETATION LOCATION: [facility] CLINICAL INDICATION: 40 years old Male with h/o RLE DVT and massive lymphedema of both LE's. Eval for venous outflow obstruction vs extrinsic mass- L97.203-Skin ulcer of calf with necrosis of muscle, unspecified laterality (RAF-HCC) COMPARISON: CT abdomen pelvis dated 02/11/2007 TECHNIQUE: Shawn spiral CT scan was obtained approximately 3 minutes after administration of IV contrast from the kidneys to the knees. Images were reconstructed in the axial plane. Multiplanar reformatted and MIP images were provided for further evaluation of the vessels. For selected cases, 3D volume rendered images are also provided. VASCULAR FINDINGS: There is overall poor contrast opacification of the venous structures, limiting evaluation. IVC: No evidence of thrombus. Iliac veins: The right iliac veins are chronically occluded/diminutive in size. There are extensive venous collaterals noted throughout the right pelvis extending into the right lower extremity. The number and caliber of the venous collaterals have increased when compared to 02/11/2007. The left iliac veins appear patent. There are also small caliber venous collaterals within the left pelvis extending into  the left lower extremity. Anterior abdominal wall venous collaterals are also noted but fully imaged secondary to patient diameter. The bilateral femoral and popliteal veins appear patent. There is no evidence of acute thrombus. The arterial vasculature is unremarkable on this venous phase time study. NONVASCULAR FINDINGS: LOWER CHEST Unremarkable. : ABDOMEN: HEPATOBILIARY: Unremarkable liver. No biliary ductal dilatation. Gallbladder is remarkable. PANCREAS: Unremarkable. SPLEEN: Unremarkable. ADRENAL GLANDS: Unremarkable. KIDNEYS/URETERS: Unremarkable. BLADDER: Unremarkable. BOWEL/PERITONEUM/RETROPERITONEUM: No bowel obstruction. No acute inflammatory process. No ascites. LYMPH NODES: Extensive right iliac chain and right inguinal adenopathy. Shawn nodal conglomerate in the right inguinal region measures 6.8 x 3.4 cm (2:124). This is only slightly larger when compared to 02/11/2007. Given interval stability over 10 years, these are favored to be reactive/secondary to congestion. REPRODUCTIVE: Unremarkable. BONES/SOFT TISSUES: No acute osseous abnormalities. Postsurgical changes in the right inguinal and left medial thigh region. Right greater than left soft tissue edema throughout the lower extremities. IMPRESSION: -Limited study secondary to poor opacification of venous structures. No definite evidence of acute deep vein thrombosis. 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 -Extensive right iliac  chain and right inguinal adenopathy, favored to be reactive/secondary to congestion. Imaging Results - CT Pelvic Venogram Run Off Performing Organization Address City/State/Zipcode Phone Number [facility] [address] [place], [place] [address] Surgical summary as listed below; Assessment: [name] is Shawn 40 y.o. male with [place] history of right lower extremity chronic obstructive deep venous disease with  recurrent ulceration, absence of right iliac vein, prior left to right femoral vein bypass. Patient continues with recurrent ulceration, his weight precludes him from further vascular studies at this time. If he is able to lose 50-100 pounds we would be able to do left lower extremity venogram possible intervention with 50% chance of re- opening occlusion per Dr. Jacolyn Reedy. For global health, weight loss, potential improvement in his chronic venous insufficiency would appreciateinput from bariatric surgery group for recommendations. 09/26/20; patient comes in today with not much improvement. He has also some odor. 3 open areas and this linear area in his mid calf require debridement. We have been using silver alginate under compression. I gave him Keflex last week for what I thought was cellulitis in the right medial thigh he is completing this and I think this is better. 7/26; patient comes in today with again necrotic debris over these wounds. According to our intake nurse extreme malodor and drainage. We have been using silver alginate under compression. He is complaining about pain in the wound area. Electronic Signature(s) Signed: 10/03/2020 5:43:33 PM By: Baltazar Najjar MD Entered By: Baltazar Najjar on 10/03/2020 17:06:03 -------------------------------------------------------------------------------- Physical Exam Details Patient Name: Date of Service: [name], Shawn Oppenheim T. 10/03/2020 2:15 PM Medical Record Number: [id number] Patient Account Number: [id number] Date of Birth/Sex: Treating RN: 07/17/1980 (40 y.o. [name] Primary Care Provider: Marcy Siren Other Clinician: Referring Provider: Treating Provider/Extender: [name] in Treatment: 2 Constitutional Patient is hypertensive.. Pulse regular and within target range for patient.[name] Respirations regular, non-labored and within target range.. Temperature is normal and within the target  range for the patient.[name] Appears in no distress. Notes Wound exam; most of this has epithelialized however he has 3 small necrotic areas anteriorly 1 laterally and 1 posterior laterally. All of these covered in thick black necrotic debris significant malodor. I used Shawn #5 curette to remove necrotic debris from the surface of all these wounds. Hemostasis with direct pressure. On the lateral wound after debridement I used Shawn curette to remove Shawn piece of deep tissue for PCR. Electronic Signature(s) Signed: 10/03/2020 5:43:33 PM By: Baltazar Najjar MD Entered By: Baltazar Najjar on 10/03/2020 17:08:27 -------------------------------------------------------------------------------- Physician Orders Details Patient Name: Date of Service: [name], Shawn Oppenheim T. 10/03/2020 2:15 PM Medical Record Number: [id number] Patient Account Number: [id number] Date of Birth/Sex: Treating RN: 1980-10-17 (40 y.o. [name] Primary Care Provider: Marcy Siren Other Clinician: Referring Provider: Treating Provider/Extender: [name] in Treatment: 2 Verbal / Phone Orders: No Diagnosis Coding Follow-up Appointments ppointment in 1 week. - Tuesday Return Shawn Bathing/ Shower/ Hygiene May shower with protection but do not get wound dressing(s) wet. - May use Shawn cast wrap to put over wraps to shower; you can buy these at [facility] or [facility] Edema Control - Lymphedema / SCD / Other Lymphedema Pumps. Use Lymphedema pumps on leg(s) 2-3 times Shawn day for 45-60 minutes. If wearing any wraps or hose, do not remove them. Continue exercising as instructed. Elevate legs to the level of the heart or above for 30 minutes daily and/or when sitting, Shawn frequency  of: Avoid standing for Koike periods of time. Patient to wear own compression stockings every day. - on Left Leg Wound Treatment Wound #32 - Lower Leg Wound Laterality: Right, Medial Cleanser: Soap and Water 2 x Per Week/15  Days Discharge Instructions: May shower and wash wound with dial antibacterial soap and water prior to dressing change. Cleanser: Wound Cleanser (DME) (Generic) 2 x Per Week/15 Days Discharge Instructions: Cleanse the wound with wound cleanser prior to applying Shawn clean dressing using gauze sponges, not tissue or cotton balls. Peri-Wound Care: Triamcinolone 15 (g) 2 x Per Week/15 Days Discharge Instructions: Use triamcinolone 15 (g) as directed Peri-Wound Care: Sween Lotion (Moisturizing lotion) 2 x Per Week/15 Days Discharge Instructions: Apply moisturizing lotion as directed Topical: Gentamicin 2 x Per Week/15 Days Discharge Instructions: As directed by physician Prim Dressing: KerraCel Ag Gelling Fiber Dressing, 4x5 in (silver alginate) (DME) (Generic) 2 x Per Week/15 Days ary Discharge Instructions: Apply silver alginate to wound bed as instructed Secondary Dressing: Woven Gauze Sponge, Non-Sterile 4x4 in (DME) (Generic) 2 x Per Week/15 Days Discharge Instructions: Apply over primary dressing as directed. Secondary Dressing: ABD Pad, 5x9 (DME) (Generic) 2 x Per Week/15 Days Discharge Instructions: Apply over primary dressing as directed. Secondary Dressing: Zetuvit Plus 4x8 in (DME) (Generic) 2 x Per Week/15 Days Discharge Instructions: Apply over primary dressing as directed. Compression Wrap: FourPress (4 layer compression wrap) (DME) (Generic) 2 x Per Week/15 Days Discharge Instructions: Apply four layer compression as directed. May also use Miliken CoFlex 2 layer compression system as alternative. Wound #33 - Lower Leg Wound Laterality: Right, Lateral Cleanser: Soap and Water 1 x Per Week/15 Days Discharge Instructions: May shower and wash wound with dial antibacterial soap and water prior to dressing change. Cleanser: Wound Cleanser (DME) (Generic) 1 x Per Week/15 Days Discharge Instructions: Cleanse the wound with wound cleanser prior to applying Shawn clean dressing using gauze  sponges, not tissue or cotton balls. Peri-Wound Care: Triamcinolone 15 (g) 1 x Per Week/15 Days Discharge Instructions: Use triamcinolone 15 (g) as directed Peri-Wound Care: Sween Lotion (Moisturizing lotion) 1 x Per Week/15 Days Discharge Instructions: Apply moisturizing lotion as directed Topical: Gentamicin 1 x Per Week/15 Days Discharge Instructions: As directed by physician Prim Dressing: KerraCel Ag Gelling Fiber Dressing, 4x5 in (silver alginate) (DME) (Generic) 1 x Per Week/15 Days ary Discharge Instructions: Apply silver alginate to wound bed as instructed Secondary Dressing: Woven Gauze Sponge, Non-Sterile 4x4 in (DME) (Generic) 1 x Per Week/15 Days Discharge Instructions: Apply over primary dressing as directed. Secondary Dressing: ABD Pad, 5x9 (DME) (Generic) 1 x Per Week/15 Days Discharge Instructions: Apply over primary dressing as directed. Secondary Dressing: Zetuvit Plus 4x8 in (DME) (Generic) 1 x Per Week/15 Days Discharge Instructions: Apply over primary dressing as directed. Compression Wrap: FourPress (4 layer compression wrap) (DME) (Generic) 1 x Per Week/15 Days Discharge Instructions: Apply four layer compression as directed. May also use Miliken CoFlex 2 layer compression system as alternative. Laboratory naerobe culture (MICRO) - PCR culture right medial LE Bacteria identified in Unspecified specimen by Shawn LOINC Code: 635-3 Convenience Name: Anerobic culture Patient Medications llergies: peanut, lidocaine Shawn Notifications Medication Indication Start End 10/03/2020 tramadol DOSE oral 50 mg tablet - 1 tablet oral q 6 hours PRN pain Electronic Signature(s) Signed: 10/03/2020 5:43:33 PM By: Baltazar Najjar MD Signed: 10/03/2020 5:55:16 PM By: Fonnie Mu RN Entered By: Fonnie Mu on 10/03/2020 15:19:39 Prescription 10/03/2020 -------------------------------------------------------------------------------- Jacqulyn Bath Marinus Maw MD Patient  Name: Provider: Sep 16, 1980 [id number] Date  of Birth: NPI#Judie Petit [id number] Sex: DEA #: [phone] Phone #: License #: Eligha Bridegroom [facility] Patient Address: [address] [place], [place] [address] [address] [place], [place] [address] [phone] Allergies peanut; lidocaine Medication Note: This prescription was automatically generated because the medication is Shawn controlled substance, and cannot be prescribed electronically. Medication: Route: Strength: Form: tramadol 50 mg tablet oral 50 mg tablet Class: OPIOID ANALGESICS Dose: Frequency / Time: Indication: 1 tablet oral q 6 hours PRN pain Number of Refills: Number of Units: 0 Generic Substitution: Start Date: End Date: One Time Use: Substitution Permitted 10/03/2020 No Note to Pharmacy: Hand Signature: Date(s): Electronic Signature(s) Signed: 10/03/2020 5:43:33 PM By: Baltazar Najjar MD Signed: 10/03/2020 5:55:16 PM By: Fonnie Mu RN Entered By: Fonnie Mu on 10/03/2020 15:19:41 -------------------------------------------------------------------------------- Problem List Details Patient Name: Date of Service: [name], Shawn Oppenheim T. 10/03/2020 2:15 PM Medical Record Number: [id number] Patient Account Number: [id number] Date of Birth/Sex: Treating RN: 12-02-1980 (40 y.o. [name], [name] Primary Care Provider: Marcy Siren Other Clinician: Referring Provider: Treating Provider/Extender: [name] in Treatment: 2 Active Problems ICD-10 Encounter Code Description Active Date MDM Diagnosis I87.331 Chronic venous hypertension (idiopathic) with ulcer and inflammation of right 09/19/2020 No Yes lower extremity I89.0 Lymphedema, not elsewhere classified 09/19/2020 No Yes L97.818 Non-pressure chronic ulcer of other part of right lower leg with other specified 09/19/2020 No Yes severity Inactive Problems Resolved  Problems Electronic Signature(s) Signed: 10/03/2020 5:43:33 PM By: Baltazar Najjar MD Entered By: Baltazar Najjar on 10/03/2020 17:01:08 -------------------------------------------------------------------------------- Progress Note Details Patient Name: Date of Service: [name], Shawn Oppenheim T. 10/03/2020 2:15 PM Medical Record Number: [id number] Patient Account Number: [id number] Date of Birth/Sex: Treating RN: [birth year] (40 y.o. [name], [name] Primary Care Provider: Marcy Siren Other Clinician: Referring Provider: Treating Provider/Extender: [name] in Treatment: 2 Subjective History of Present Illness (HPI) The following HPI elements were documented for the patient's wound: Location: Patient presents with Shawn wound to right lower leg. Quality: very chronic but clean Duration: over 1 year Modifying Factors: r iliac obs or absence, obesity The patient is Shawn 40 yrs old bm here for evaluation of his right leg ulcer. He has an extensive history of lymphedema and ulcers. He is being treated at the [facility] by Dr. [name] with [name] boots and the [facility]. He has pumps at home but he is only using them once Shawn day. 08/30/14 selective debridement done of surface eschar. The wound cleans up quite nicely in the bed of this looks healthy. He is using Shawn wound VAC under an Unna wrap. 11/17/14; selective debridement done of surface eschar. Once again the wound beds at clean up quite nicely. He is no longer using Shawn wound VAC. Recent dressing changes include Hydrofera Blue. Apparently he has done Shawn wide variety of different topical dressings including advanced treatment options like Apligraf's without success. 03/21/15; surgical debridement done of surface eschar and nonviable subcutaneous tissue. The area on the medial aspect of the right leg has closed and the wound on the lateral right leg looks improved has been using Silver Collagen 04/11/15; I think attendance here is  sporadic. The area on the right medial leg remains healed. He has Shawn new tiny area on the back of the right leg. Again Shawn surgical debridement of the surface eschar and nonviable subcutaneous tissue of the major wound on the right lateral leg. He has been using Silver  Collagen and at home, he does his own Unna wraps at home edema control seems reasonable. 04/20/15; the area on the right medial leg has Shawn very superficial area which may not even be open nevertheless I felt needed to be dressed today [this is his original chronic wound] the new wound is on the right anterior lateral leg is underwent Shawn surgical debridement. He has Shawn small wound on the right posterior leg. He puts on his own Unna boots, according to our nurses he does this fairly well. We have been using Silver collagen. 05/02/2015 -- I understand in the past his venous studies have been done at [facility] and he was advised weight loss before they would attempt to tackle his iliac vein blockage. He has not gone back for review. 06/27/2015 -- we have applied for Apligraf and are awaiting his insurance clearance. 07/25/2015 -- he still has to use her back from his insurance agent regarding his copayment for his Apligraf. 07/31/2015 -- the patient got Shawn reply from the insurance agent regarding the dollar payment for his application but is not sure whether it is for 5 or for one. 11/28/2015 -- has been hurting Shawn little bit more and he has had change in color of his wound especially on the medial part. 12/05/2015 -- his culture was positive for Proteus mirabilis and K Oxytoca which are sensitive to ciprofloxacin which he is already on. 12/12/15oostill on ciprofloxacin. His mother reports of dressing had to be changed last Friday due to odor. He has not been systemically unwell 12/19/15; patient came in today complaining of increasing pain and tightness in his upper right thigh. He completed the ciprofloxacin 2 or 3 days ago. He is not running Shawn  fever today. 12/26/2015 -- last week he had been seen by Dr. [name] who got Shawn lower extremity venous duplex evaluation done which did not show DVT or SVT in the right lower extremity. he had also put him on Augmentin in addition to the previous ciprofloxacin and he had received for 2 weeks 01/02/2016 -- -- was admitted to the hospital on 12/26/2015 and discharged on 12/29/2015 and was treated for cellulitis. He was also newly diagnosed with type 2 diabetes mellitus and outpatient monitoring and initiation of treatment was recommended. Patientoos hemoglobin A1c was 6.6 No osteomyelitis detected on x-rays and recent Doppler study was negative for DVT Oral doxycycline and Levaquin were recommended for the patient as an . outpatient for Shawn 14 day treatment. IV Zosyn and vancomycin was given due to concerns of Pseudomonas infection while he was in hospital. 02/13/2016 -- he has not gone back to [facility] where he had his vascular opinion earlier and I have urged him to regroup with them to see if there is any surgical options available. 02/27/2016 -- has an appointment to see [facility] vascular surgeons on January 3 and may be seeing Dr. Verdie Drown 03/19/2016 -- I was not able to find any notes on Epic but the patient did go to the vascular clinic at [facility] and saw the PA to Dr. Verdie Drown. I understand she has recommended Shawn CT scan and MRI to be done on February 1 and they would review this with him on the same day. 04/09/2016 -- was admitted to the hospital on 03/20/2016 acute respiratory failure with hypoxia, abdominal discomfort with erythema, hypertensive urgency and chronic wound of the right leg with morbid obesity. he was discharged home on 04/05/2016 and was to continue  on IV antibiotics for 18 more days follow-up with the wound center and continue with his cardiologist. he has lost approximately 130 pounds and diuresed over 48 L. He was treated with IV Zosyn and  ID recommended he continue this for 3 weeks more. The notes from [facility] wound clinic were noted where he was seen by the PA and she had taken cultures which grew MSSA and Pseudomonas. She had recommended edema control with Shawn 4-layer compression and also referred him to the lymphedema clinic. Shawn CT venogram was also planned for the future. 04/16/2016 -- at [facility] Shawn CT pelvic venogram runoff was done on 04/11/2016 -- IMPRESSION:-Limited study secondary to poor opacification of venous structures. No definite evidence of acute deep vein thrombosis. 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 -Extensive right iliac chain and right inguinal adenopathy, favored to be reactive/secondary to congestion. The patient continues on his IV antibiotics through his PICC line and has his cardiology opinion and also Shawn bariatric surgery opinion coming up. 04/30/2016 -- He has completed his IV antibiotics through his PICC line. 05/14/2016 --he was seen by the PA, Ms Sluss, recommended alternate day dressing with compression and use Hibiclens and Dakin's solution with acetic acid on his wounds. 07/30/2016 -- he has still not got his custom-made compression stockings and I have urged him to go and get him self measured for these. 08/06/2016 -- he has been measured for his custom stocking and will get in 2 weeks. He has lost 20 pounds since March 08/27/2016 -- he has not got his custom stockings yet and he tried another pair which has not fit well and he has had Shawn lot of maceration increase the size of his wound. 09/03/2016 -- the patient says that he has not been very compliant with his dressing changes and his elevation and exercise and this week he is notices wound get rather large with maceration. 09/18/16; according to our intake nurse the measurements on this patient's wound are slightly larger. I note previous compliance  concerns. I note that his wounds measured larger last week which was noted by Dr. Meyer Russel. Culture done last week was negative. 10/01/2016 -- the patient has received some lymphedema pumps which are new and he says he is being compliant with this. He is going to be away for 2 weeks on vacation to [facility] 10/15/2016 -- he returns after 2 weeks and tells me he has been diligent with his dressing and has lost 25 pounds over the last 3 months. 10/22/2016 -- his last hemoglobin A1c was 6.2 and he is now diet controlled. 11/19/2016 -- he has been having problems with his compression stockings which are custom made at [facility] medical. He has not yet been able to get them. He had taken out his compression because he had gone there and had no dressing on when he came here today READMISSION 05/22/2018 [name] is Shawn now 40 year old man with Shawn [place] history of wounds in his lower extremities secondary to chronic venous insufficiency with secondary lymphedema. He has had previous vein ligations on the right although I do not have this information in front of me. As I understand things he also has central venous obstruction with Shawn vein bypass in 2005 I believe this was done at [facility]. He tells me over the last 2 months he has noted mid reopening in the right mid anterior lower extremity. This is Shawn rectangular shaped wound  which is kind of odd in terms of how that formed. He has been using silver alginate and Unna boots that we used on him when he was here in 2018. He buys his product supply online thinks this is cheaper than ordering through intermediary's Past medical history; CHF, morbid obesity, hypertension, hyperlipidemia, chronic venous insufficiency, vein bypass in 2005 previous vein ablations or ligations. He has Shawn stent in the right lower extremity. ABI in this clinic was 1.04. He is using his compression pumps at home 3/27; 2-week follow-up. Right lower extremity wounds related to chronic venous  insufficiency and lymphedema. He has been using silver alginate under an Radio broadcast assistant. He change the [employer] himself at home last week. He is making nice progress 4/10; 2-week follow-up. Right lower extremity wound is just about closed Shawn small open area in the middle of the and large rectangular wound he had at the beginning. His edema control is excellent. He says he is using his compression pumps religiously 4/17; 2-week follow-up. Right lower extremity wound is totally closed. He had Shawn large rectangular wound which is progressively closed. His edema control is very good. He is using his compression pumps once to twice Shawn day READMISSION 09/19/2020 [name] is now Shawn 40 year old man we have had for several stays in this clinic including 2017, 2018 and most recently in 2020 with Shawn wound on his right lower leg. He has compression pumps which he claims to be using twice Shawn day. He also has stockings however I am not sure how much he uses them. Things have broken down over the last month or 2 he has an extensive wound area on the right mid tibial area I think this is similar to what he has had in the past. He has been using Xeroform and an Ace wrap that his girlfriend is applying. Past medical history; the patient has known chronic venous insufficiency with secondary lymphedema. He had an iliac vein blockage and he had Shawn bypass at [facility] although the patient is not followed up with them. The surgeon may have been Dr. Jacolyn Reedy. As noted he has lymphedema pumps. Type 2 diabetes congestive heart failure obstructive sleep apnea. He has Shawn history of Shawn right leg DVT although this may have been the iliac vein he is not currently on anticoagulation ABI in our clinic is 1.37 on the right Imaging Results - in this encounter CT Pelvic Venogram Run Off Imaging Results - CT Pelvic Venogram Run Off Impressions Performed At -Limited study secondary to poor opacification of venous structures. Nodefinite evidence of acute  deep vein thrombosis. 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 -Extensive right iliac chain and right inguinal adenopathy, favored to bereactive/secondary to congestion. [facility] Imaging Results - CT Pelvic Venogram Run Off Narrative Performed At EXAM: CT PELVIC VENOGRAM RUN OFF DATE: 04/11/2016 1:28 PM ACCESSION: [id number] UN DICTATED: 04/11/2016 2:18 PM INTERPRETATION LOCATION: [facility] CLINICAL INDICATION: 40 years old Male with h/o RLE DVT and massive lymphedema of both LE's. Eval for venous outflow obstruction vs extrinsic mass- L97.203-Skin ulcer of calf with necrosis of muscle, unspecified laterality(RAF-HCC) COMPARISON: CT abdomen pelvis dated 02/11/2007 TECHNIQUE: Shawn spiral CT scan was obtained approximately 3 minutes after administration of IV contrast from the kidneys to the knees. Images were reconstructed in the axial plane. Multiplanar reformatted and MIP images were provided for further evaluation of the vessels. For selected cases, 3D volumerendered images are also  provided. VASCULAR FINDINGS: There is overall poor contrast opacification of the venous structures, limitingevaluation. IVC: No evidence of thrombus. Iliac veins: The right iliac veins are chronically occluded/diminutive in size. There are extensive venous collaterals noted throughout the right pelvis extending into the right lower extremity. The number and caliber of the venous collaterals have increased when compared to 02/11/2007. The left iliac veins appear patent. There are also small caliber venous collaterals within the left pelvis extending into the left lower extremity. Anterior abdominal wall venouscollaterals are also noted but fully imaged secondary to patient diameter. The bilateral femoral and popliteal veins appear patent. There is no evidence of acute thrombus. The arterial vasculature is unremarkable  on this venous phase time study. NONVASCULAR FINDINGS: LOWER CHEST Unremarkable. : ABDOMEN: HEPATOBILIARY: Unremarkable liver. No biliary ductal dilatation. Gallbladder isremarkable. PANCREAS: Unremarkable. SPLEEN: Unremarkable. ADRENAL GLANDS: Unremarkable. KIDNEYS/URETERS: Unremarkable. BLADDER: Unremarkable. BOWEL/PERITONEUM/RETROPERITONEUM: No bowel obstruction. No acute inflammatoryprocess. No ascites. LYMPH NODES: Extensive right iliac chain and right inguinal adenopathy. Shawn nodal conglomerate in the right inguinal region measures 6.8 x 3.4 cm (2:124). This is only slightly larger when compared to 02/11/2007. Given interval stabilityover 10 years, these are favored to be reactive/secondary to congestion. REPRODUCTIVE: Unremarkable. BONES/SOFT TISSUES: No acute osseous abnormalities. Postsurgical changes in the right inguinal and left medial thigh region. Right greater than left softtissue edema throughout the lower extremities. [facility] Imaging Results - CT Pelvic Venogram Run Off Procedure Note Interface, Rad Results In - 04/11/2016 3:28 PM EST EXAM: CT PELVIC VENOGRAM RUN OFF DATE: 04/11/2016 1:28 PM ACCESSION: [id number] UN DICTATED: 04/11/2016 2:18 PM INTERPRETATION LOCATION: [facility] CLINICAL INDICATION: 40 years old Male with h/o RLE DVT and massive lymphedema of both LE's. Eval for venous outflow obstruction vs extrinsic mass- L97.203-Skin ulcer of calf with necrosis of muscle, unspecified laterality (RAF-HCC) COMPARISON: CT abdomen pelvis dated 02/11/2007 TECHNIQUE: Shawn spiral CT scan was obtained approximately 3 minutes after administration of IV contrast from the kidneys to the knees. Images were reconstructed in the axial plane. Multiplanar reformatted and MIP images were provided for further evaluation of the vessels. For selected cases, 3D volume rendered images are also provided. VASCULAR FINDINGS: There is overall poor contrast opacification of the venous structures,  limiting evaluation. IVC: No evidence of thrombus. Iliac veins: The right iliac veins are chronically occluded/diminutive in size. There are extensive venous collaterals noted throughout the right pelvis extending into the right lower extremity. The number and caliber of the venous collaterals have increased when compared to 02/11/2007. The left iliac veins appear patent. There are also small caliber venous collaterals within the left pelvis extending into the left lower extremity. Anterior abdominal wall venous collaterals are also noted but fully imaged secondary to patient diameter. The bilateral femoral and popliteal veins appear patent. There is no evidence of acute thrombus. The arterial vasculature is unremarkable on this venous phase time study. NONVASCULAR FINDINGS: LOWER CHEST Unremarkable. : ABDOMEN: HEPATOBILIARY: Unremarkable liver. No biliary ductal dilatation. Gallbladder is remarkable. PANCREAS: Unremarkable. SPLEEN: Unremarkable. ADRENAL GLANDS: Unremarkable. KIDNEYS/URETERS: Unremarkable. BLADDER: Unremarkable. BOWEL/PERITONEUM/RETROPERITONEUM: No bowel obstruction. No acute inflammatory process. No ascites. LYMPH NODES: Extensive right iliac chain and right inguinal adenopathy. Shawn nodal conglomerate in the right inguinal region measures 6.8 x 3.4 cm (2:124). This is only slightly larger when compared to 02/11/2007. Given interval stability over 10 years, these are favored to be reactive/secondary to congestion. REPRODUCTIVE: Unremarkable. BONES/SOFT TISSUES: No acute osseous abnormalities. Postsurgical changes in the right inguinal and left medial thigh region. Right greater than  left soft tissue edema throughout the lower extremities. IMPRESSION: -Limited study secondary to poor opacification of venous structures. No definite evidence of acute deep vein thrombosis. 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 -Extensive right iliac chain and right inguinal adenopathy, favored to be reactive/secondary to congestion. Imaging Results - CT Pelvic Venogram Run Off Performing Organization Address City/State/Zipcode Phone Number [employer] [facility] [address] [place], [place] [address] Surgical summary as listed below; Assessment: [name] is Shawn 40 y.o. male with [place] history of right lower extremity chronic obstructive deep venous disease with recurrent ulceration, absence of right iliac vein, prior left to right femoral vein bypass. Patient continues with recurrent ulceration, his weight precludes him from further vascular studies at this time. If he is able to lose 50-100 pounds we would be able to do left lower extremity venogram possible intervention with 50% chance of re- opening occlusion per Dr. Jacolyn Reedy. For global health, weight loss, potential improvement in his chronic venous insufficiency would appreciateinput from bariatric surgery group for recommendations. 09/26/20; patient comes in today with not much improvement. He has also some odor. 3 open areas and this linear area in his mid calf require debridement. We have been using silver alginate under compression. I gave him Keflex last week for what I thought was cellulitis in the right medial thigh he is completing this and I think this is better. 7/26; patient comes in today with again necrotic debris over these wounds. According to our intake nurse extreme malodor and drainage. We have been using silver alginate under compression. He is complaining about pain in the wound area. Objective Constitutional Patient is hypertensive.. Pulse regular and within target range for patient.[name] Respirations regular, non-labored and within target range.. Temperature is normal and within the target range for the patient.[name] Appears in no distress. Vitals Time Taken: 2:31 PM, Height: 74 in, Source: Stated, Weight: 425 lbs, Source:  Stated, BMI: 54.6, Temperature: 98.5 F, Pulse: 93 bpm, Respiratory Rate: 20 breaths/min, Blood Pressure: 148/85 mmHg. General Notes: Wound exam; most of this has epithelialized however he has 3 small necrotic areas anteriorly 1 laterally and 1 posterior laterally. All of these covered in thick black necrotic debris significant malodor. I used Shawn #5 curette to remove necrotic debris from the surface of all these wounds. Hemostasis with direct pressure. On the lateral wound after debridement I used Shawn curette to remove Shawn piece of deep tissue for PCR. Integumentary (Hair, Skin) Wound #32 status is Open. Original cause of wound was Gradually Appeared. The date acquired was: 07/11/2020. The wound has been in treatment 2 weeks. The wound is located on the Right,Medial Lower Leg. The wound measures 5.7cm length x 23.5cm width x 0.5cm depth; 105.204cm^2 area and 52.602cm^3 volume. There is Fat Layer (Subcutaneous Tissue) exposed. There is no tunneling or undermining noted. There is Shawn large amount of purulent drainage noted. Foul odor after cleansing was noted. The wound margin is distinct with the outline attached to the wound base. There is small (1-33%) red granulation within the wound bed. There is Shawn large (67-100%) amount of necrotic tissue within the wound bed including Adherent Slough. Wound #33 status is Open. Original cause of wound was Gradually Appeared. The date acquired was: 07/10/2020. The wound has been in treatment 2 weeks. The wound is located on the Right,Lateral Lower Leg. The wound measures 0.3cm length x 0.3cm width x 0.1cm depth; 0.071cm^2 area and 0.007cm^3  volume. There is Fat Layer (Subcutaneous Tissue) exposed. There is no tunneling or undermining noted. There is Shawn small amount of serosanguineous drainage noted. The wound margin is flat and intact. There is large (67-100%) red granulation within the wound bed. There is no necrotic tissue within the wound bed. Assessment Active  Problems ICD-10 Chronic venous hypertension (idiopathic) with ulcer and inflammation of right lower extremity Lymphedema, not elsewhere classified Non-pressure chronic ulcer of other part of right lower leg with other specified severity Procedures Wound #32 Pre-procedure diagnosis of Wound #32 is Shawn Lymphedema located on the Right,Medial Lower Leg . There was Shawn Excisional Skin/Subcutaneous Tissue Debridement with Shawn total area of 28.5 sq cm performed by Maxwell Caul., MD. With the following instrument(s): Curette to remove Viable and Non-Viable tissue/material. Material removed includes Subcutaneous Tissue, Slough, Skin: Dermis, and Skin: Epidermis after achieving pain control using Lidocaine. 1 specimen was taken by Shawn Tissue Culture and sent to the lab per facility protocol. Shawn time out was conducted at 15:22, prior to the start of the procedure. Shawn Minimum amount of bleeding was controlled with Pressure. The procedure was tolerated well with Shawn pain level of 0 throughout and Shawn pain level of 0 following the procedure. Post Debridement Measurements: 5.7cm length x 23.5cm width x 0.5cm depth; 52.602cm^3 volume. Character of Wound/Ulcer Post Debridement is improved. Post procedure Diagnosis Wound #32: Same as Pre-Procedure Plan Follow-up Appointments: Return Appointment in 1 week. - Tuesday Bathing/ Shower/ Hygiene: May shower with protection but do not get wound dressing(s) wet. - May use Shawn cast wrap to put over wraps to shower; you can buy these at [facility] or [facility] Edema Control - Lymphedema / SCD / Other: Lymphedema Pumps. Use Lymphedema pumps on leg(s) 2-3 times Shawn day for 45-60 minutes. If wearing any wraps or hose, do not remove them. Continue exercising as instructed. Elevate legs to the level of the heart or above for 30 minutes daily and/or when sitting, Shawn frequency of: Avoid standing for [place] periods of time. Patient to wear own compression stockings every day. - on Left  Leg Laboratory ordered were: Anerobic culture - PCR culture right medial LE The following medication(s) was prescribed: tramadol oral 50 mg tablet 1 tablet oral q 6 hours PRN pain starting 10/03/2020 WOUND #32: - Lower Leg Wound Laterality: Right, Medial Cleanser: Soap and Water 2 x Per Week/15 Days Discharge Instructions: May shower and wash wound with dial antibacterial soap and water prior to dressing change. Cleanser: Wound Cleanser (DME) (Generic) 2 x Per Week/15 Days Discharge Instructions: Cleanse the wound with wound cleanser prior to applying Shawn clean dressing using gauze sponges, not tissue or cotton balls. Peri-Wound Care: Triamcinolone 15 (g) 2 x Per Week/15 Days Discharge Instructions: Use triamcinolone 15 (g) as directed Peri-Wound Care: Sween Lotion (Moisturizing lotion) 2 x Per Week/15 Days Discharge Instructions: Apply moisturizing lotion as directed Topical: Gentamicin 2 x Per Week/15 Days Discharge Instructions: As directed by physician Prim Dressing: KerraCel Ag Gelling Fiber Dressing, 4x5 in (silver alginate) (DME) (Generic) 2 x Per Week/15 Days ary Discharge Instructions: Apply silver alginate to wound bed as instructed Secondary Dressing: Woven Gauze Sponge, Non-Sterile 4x4 in (DME) (Generic) 2 x Per Week/15 Days Discharge Instructions: Apply over primary dressing as directed. Secondary Dressing: ABD Pad, 5x9 (DME) (Generic) 2 x Per Week/15 Days Discharge Instructions: Apply over primary dressing as directed. Secondary Dressing: Zetuvit Plus 4x8 in (DME) (Generic) 2 x Per Week/15 Days Discharge Instructions: Apply over primary dressing as directed.  Com pression Wrap: FourPress (4 layer compression wrap) (DME) (Generic) 2 x Per Week/15 Days Discharge Instructions: Apply four layer compression as directed. May also use Miliken CoFlex 2 layer compression system as alternative. WOUND #33: - Lower Leg Wound Laterality: Right, Lateral Cleanser: Soap and Water 1 x Per Week/15  Days Discharge Instructions: May shower and wash wound with dial antibacterial soap and water prior to dressing change. Cleanser: Wound Cleanser (DME) (Generic) 1 x Per Week/15 Days Discharge Instructions: Cleanse the wound with wound cleanser prior to applying Shawn clean dressing using gauze sponges, not tissue or cotton balls. Peri-Wound Care: Triamcinolone 15 (g) 1 x Per Week/15 Days Discharge Instructions: Use triamcinolone 15 (g) as directed Peri-Wound Care: Sween Lotion (Moisturizing lotion) 1 x Per Week/15 Days Discharge Instructions: Apply moisturizing lotion as directed Topical: Gentamicin 1 x Per Week/15 Days Discharge Instructions: As directed by physician Prim Dressing: KerraCel Ag Gelling Fiber Dressing, 4x5 in (silver alginate) (DME) (Generic) 1 x Per Week/15 Days ary Discharge Instructions: Apply silver alginate to wound bed as instructed Secondary Dressing: Woven Gauze Sponge, Non-Sterile 4x4 in (DME) (Generic) 1 x Per Week/15 Days Discharge Instructions: Apply over primary dressing as directed. Secondary Dressing: ABD Pad, 5x9 (DME) (Generic) 1 x Per Week/15 Days Discharge Instructions: Apply over primary dressing as directed. Secondary Dressing: Zetuvit Plus 4x8 in (DME) (Generic) 1 x Per Week/15 Days Discharge Instructions: Apply over primary dressing as directed. Com pression Wrap: FourPress (4 layer compression wrap) (DME) (Generic) 1 x Per Week/15 Days Discharge Instructions: Apply four layer compression as directed. May also use Miliken CoFlex 2 layer compression system as alternative. 1. Continuing with silver alginate although I put gentamicin under all of these open areas 2. Aggressive debridement as noted. At this point I think further debridement is going to be necessary. 3. I am going to bring him back in for Shawn nurse visit to check the status of his leg. He is complaining of pain in the wound area but I could not convince myself there is cellulitis or Shawn DVT. 4. I  gave him Shawn prescription for tramadol 30 mg p.o. every 6 hours as needed 5. He says his girlfriend is Shawn Engineer, civil (consulting) and should be able to do compression wraps 6. He has lymphedema pumps I did not ask him about his compliance today. 7. Based on the PCR culture he may be Shawn candidate for Shawn compounded topical antibiotic which might help with the severe wound surface bioburden that is likely to be present here. Electronic Signature(s) Signed: 10/03/2020 5:43:33 PM By: Baltazar Najjar MD Entered By: Baltazar Najjar on 10/03/2020 17:10:41 -------------------------------------------------------------------------------- SuperBill Details Patient Name: Date of Service: [name], Theodosia Quay 10/03/2020 Medical Record Number: [id number] Patient Account Number: [id number] Date of Birth/Sex: Treating RN: Jun 17, 1980 (40 y.o. [name], [name] Primary Care Provider: Marcy Siren Other Clinician: Referring Provider: Treating Provider/Extender: [name] in Treatment: 2 Diagnosis Coding ICD-10 Codes Code Description [phone] Chronic venous hypertension (idiopathic) with ulcer and inflammation of right lower extremity I89.0 Lymphedema, not elsewhere classified L97.818 Non-pressure chronic ulcer of other part of right lower leg with other specified severity Facility Procedures CPT4 Code: [id number] Description: 11042 - DEB SUBQ TISSUE 20 SQ CM/< ICD-10 Diagnosis Description L97.818 Non-pressure chronic ulcer of other part of right lower leg with other specified Modifier: severity Quantity: 1 CPT4 Code: [id number] Description: 11045 - DEB SUBQ TISS EA ADDL 20CM ICD-10 Diagnosis Description L97.818 Non-pressure chronic ulcer of other part of right lower leg  with other specified Modifier: severity Quantity: 1 Physician Procedures : CPT4 Code Description Modifier [id number] 11042 - WC PHYS SUBQ TISS 20 SQ CM ICD-10 Diagnosis Description L97.818 Non-pressure chronic ulcer of other  part of right lower leg with other specified severity Quantity: 1 : [id number] 11045 - WC PHYS SUBQ TISS EA ADDL 20 CM ICD-10 Diagnosis Description L97.818 Non-pressure chronic ulcer of other part of right lower leg with other specified severity Quantity: 1 Electronic Signature(s) Signed: 10/03/2020 5:43:33 PM By: Baltazar Najjar MD Entered By: Baltazar Najjar on 10/03/2020 17:13:05

## 2020-10-06 ENCOUNTER — Encounter (HOSPITAL_BASED_OUTPATIENT_CLINIC_OR_DEPARTMENT_OTHER): Payer: 59 | Admitting: Internal Medicine

## 2020-10-06 ENCOUNTER — Ambulatory Visit (INDEPENDENT_AMBULATORY_CARE_PROVIDER_SITE_OTHER): Payer: 59 | Admitting: Family

## 2020-10-06 ENCOUNTER — Encounter (HOSPITAL_BASED_OUTPATIENT_CLINIC_OR_DEPARTMENT_OTHER): Payer: Self-pay | Admitting: Family

## 2020-10-06 ENCOUNTER — Other Ambulatory Visit: Payer: Self-pay

## 2020-10-06 ENCOUNTER — Ambulatory Visit: Payer: Self-pay | Admitting: Family

## 2020-10-06 VITALS — BP 148/94 | HR 95 | Ht 74.0 in | Wt >= 6400 oz

## 2020-10-06 DIAGNOSIS — I890 Lymphedema, not elsewhere classified: Secondary | ICD-10-CM | POA: Diagnosis not present

## 2020-10-06 DIAGNOSIS — I87331 Chronic venous hypertension (idiopathic) with ulcer and inflammation of right lower extremity: Secondary | ICD-10-CM | POA: Diagnosis not present

## 2020-10-06 DIAGNOSIS — I10 Essential (primary) hypertension: Secondary | ICD-10-CM

## 2020-10-06 DIAGNOSIS — L97818 Non-pressure chronic ulcer of other part of right lower leg with other specified severity: Secondary | ICD-10-CM

## 2020-10-06 DIAGNOSIS — I5042 Chronic combined systolic (congestive) and diastolic (congestive) heart failure: Secondary | ICD-10-CM

## 2020-10-06 DIAGNOSIS — G4733 Obstructive sleep apnea (adult) (pediatric): Secondary | ICD-10-CM | POA: Diagnosis not present

## 2020-10-06 MED ORDER — METOLAZONE 2.5 MG PO TABS: ORAL_TABLET | ORAL | 0 refills | Status: DC

## 2020-10-06 NOTE — Patient Instructions (Signed)
Medication Instructions:  Your physician has recommended you make the following change in your medication:   START Metolazone one 2.5mg  tablet thirty minutes prior to your morning Furosemide on Saturday and Tuesday  *If you need a refill on your cardiac medications before your next appointment, please call your pharmacy*   Lab Work: Your physician recommends that you return for lab work in 1 week at  for [facility] and CBC - okay to collect at primary care - Alver Sorrow, NP will send a note to your PCP office  If you have labs (blood work) drawn today and your tests are completely normal, you will receive your results only by: MyChart Message (if you have MyChart) OR A paper copy in the mail If you have any lab test that is abnormal or we need to change your treatment, we will call you to review the results.   Testing/Procedures: Your EKG today showed normal sinus rhythm which is a good result.   Follow-Up: At [facility], you and your health needs are our priority.  As part of our continuing mission to provide you with exceptional heart care, we have created designated Provider Care Teams.  These Care Teams include your primary Cardiologist (physician) and Advanced Practice Providers (APPs -  Physician Assistants and Nurse Practitioners) who all work together to provide you with the care you need, when you need it.  We recommend signing up for the patient portal called "MyChart".  Sign up information is provided on this After Visit Summary.  MyChart is used to connect with patients for Virtual Visits (Telemedicine).  Patients are able to view lab/test results, encounter notes, upcoming appointments, etc.  Non-urgent messages can be sent to your provider as well.   To learn more about what you can do with MyChart, go to [email or web address].    Your next appointment:   3 week(s)  The format for your next appointment:   In Person  Provider:   Chilton Si, MD or Alver Sorrow, NP    Other Instructions  Heart Healthy Diet Recommendations: A low-salt diet is recommended. Meats should be grilled, baked, or boiled. Avoid fried foods. Focus on lean protein sources like fish or chicken with vegetables and fruits. The American Heart Association is a Chief Technology Officer!  Exercise recommendations: The American Heart Association recommends 150 minutes of moderate intensity exercise weekly. Try 30 minutes of moderate intensity exercise 4-5 times per week. This could include walking, jogging, or swimming.  To prevent or reduce lower extremity swelling: Eat a low salt diet. Salt makes the body hold onto extra fluid which causes swelling. Sit with legs elevated. For example, in the recliner or on an ottoman.  Wear knee-high compression stockings during the daytime. Ones labeled 15-20 mmHg provide good compression.   Recommend weighing daily and keeping a log. Please call our office if you have weight gain of 2 pounds overnight or 5 pounds in 1 week.   Date  Time Weight

## 2020-10-06 NOTE — Progress Notes (Signed)
[name] ([id number]) . Visit Report for 10/06/2020 Chief Complaint Document Details Patient Name: Date of Service: [name] 10/06/2020 11:15 [name] Medical Record Number: [id number] Patient Account Number: [id number] Date of Birth/Sex: Treating RN: [birth year] (40 y.o. [name] Primary Care Provider: Marcy [name] Other Clinician: Referring Provider: Treating Provider/Extender: [name] in Treatment: 2 Information Obtained from: Patient Chief Complaint pt has blockage or absence of r iliac vein 05/22/2018; patient is here for review of wound on his right lower extremity 09/19/2020; patient is again here for wound review of wounds on his right lower leg Electronic Signature(s) Signed: 10/06/2020 12:52:39 PM By: Geralyn Corwin DO Entered By: Geralyn Corwin on 10/06/2020 12:43:52 -------------------------------------------------------------------------------- HPI Details Patient Name: Date of Service: [name], Shawn DRIA N [name]. 10/06/2020 11:15 [name] Medical Record Number: [id number] Patient Account Number: [id number] Date of Birth/Sex: Treating RN: 1980/08/12 (40 y.o. [name] Primary Care Provider: Marcy [name] Other Clinician: Referring Provider: Treating Provider/Extender: [name] in Treatment: 2 History of Present Illness Location: Patient presents with Shawn wound to right lower leg. Quality: very chronic but clean Duration: over 1 year Modifying Factors: r iliac obs or absence, obesity HPI Description: The patient is Shawn 40 yrs old bm here for evaluation of his right leg ulcer. He has an extensive history of lymphedema and ulcers. He is being treated at the [facility] by Dr. [name] with [name] boots and the [facility]. He has pumps at home but he is only using them once Shawn day. 08/30/14 selective debridement done of surface eschar. The wound cleans up quite nicely in the bed of this looks healthy. He is  using Shawn wound VAC under an Unna wrap. 11/17/14; selective debridement done of surface eschar. Once again the wound beds at clean up quite nicely. He is no longer using Shawn wound VAC. Recent dressing changes include Hydrofera Blue. Apparently he has done Shawn wide variety of different topical dressings including advanced treatment options like Apligraf's without success. 03/21/15; surgical debridement done of surface eschar and nonviable subcutaneous tissue. The area on the medial aspect of the right leg has closed and the wound on the lateral right leg looks improved has been using Silver Collagen 04/11/15; I think attendance here is sporadic. The area on the right medial leg remains healed. He has Shawn new tiny area on the back of the right leg. Again Shawn surgical debridement of the surface eschar and nonviable subcutaneous tissue of the major wound on the right lateral leg. He has been using Silver Collagen and at home, he does his own Unna wraps at home edema control seems reasonable. 04/20/15; the area on the right medial leg has Shawn very superficial area which may not even be open nevertheless I felt needed to be dressed today [this is his original chronic wound] the new wound is on the right anterior lateral leg is underwent Shawn surgical debridement. He has Shawn small wound on the right posterior leg. He puts on his own Unna boots, according to our nurses he does this fairly well. We have been using Silver collagen. 05/02/2015 -- I understand in the past his venous studies have been done at [facility] and he was advised weight loss before they would attempt to tackle his iliac vein blockage. He has not gone back for review. 06/27/2015 -- we have applied for Apligraf and are awaiting his insurance clearance. 07/25/2015 -- he still has to use her  back from his insurance agent regarding his copayment for his Apligraf. 07/31/2015 -- the patient got Shawn reply from the insurance agent regarding the dollar payment for his  application but is not sure whether it is for 5 or for one. 11/28/2015 -- has been hurting Shawn little bit more and he has had change in color of his wound especially on the medial part. 12/05/2015 -- his culture was positive for Proteus mirabilis and K Oxytoca which are sensitive to ciprofloxacin which he is already on. 10/3/17still on ciprofloxacin. His mother reports of dressing had to be changed last Friday due to odor. He has not been systemically unwell 12/19/15; patient came in today complaining of increasing pain and tightness in his upper right thigh. He completed the ciprofloxacin 2 or 3 days ago. He is not running Shawn fever today. 12/26/2015 -- last week he had been seen by Dr. [name] who got Shawn lower extremity venous duplex evaluation done which did not show DVT or SVT in the right lower extremity. he had also put him on Augmentin in addition to the previous ciprofloxacin and he had received for 2 weeks 01/02/2016 -- -- was admitted to the hospital on 12/26/2015 and discharged on 12/29/2015 and was treated for cellulitis. He was also newly diagnosed with type 2 diabetes mellitus and outpatient monitoring and initiation of treatment was recommended. Patients hemoglobin A1c was 6.6 No osteomyelitis detected on x-rays and recent Doppler study was negative for DVT Oral doxycycline and Levaquin were recommended for the patient as an . outpatient for Shawn 14 day treatment. IV Zosyn and vancomycin was given due to concerns of Pseudomonas infection while he was in hospital. 02/13/2016 -- he has not gone back to [facility] where he had his vascular opinion earlier and I have urged him to regroup with them to see if there is any surgical options available. 02/27/2016 -- has an appointment to see [facility] vascular surgeons on January 3 and may be seeing Dr. Verdie Drown 03/19/2016 -- I was not able to find any notes on Epic but the patient did go to the vascular clinic at [facility] and saw  the PA to Dr. Verdie Drown. I understand she has recommended Shawn CT scan and MRI to be done on February 1 and they would review this with him on the same day. 04/09/2016 -- was admitted to the hospital on 03/20/2016 acute respiratory failure with hypoxia, abdominal discomfort with erythema, hypertensive urgency and chronic wound of the right leg with morbid obesity. he was discharged home on 04/05/2016 and was to continue on IV antibiotics for 18 more days follow-up with the wound center and continue with his cardiologist. he has lost approximately 130 pounds and diuresed over 48 L. He was treated with IV Zosyn and ID recommended he continue this for 3 weeks more. The notes from [facility] wound clinic were noted where he was seen by the PA and she had taken cultures which grew MSSA and Pseudomonas. She had recommended edema control with Shawn 4-layer compression and also referred him to the lymphedema clinic. Shawn CT venogram was also planned for the future. 04/16/2016 -- at [facility] Shawn CT pelvic venogram runoff was done on 04/11/2016 -- IMPRESSION:-Limited study secondary to poor opacification of venous structures. No definite evidence of acute deep vein thrombosis. 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 -Extensive right iliac chain and right inguinal adenopathy, favored  to be reactive/secondary to congestion. The patient continues on his IV antibiotics through his PICC line and has his cardiology opinion and also Shawn bariatric surgery opinion coming up. 04/30/2016 -- He has completed his IV antibiotics through his PICC line. 05/14/2016 --he was seen by the PA, Ms Sluss, recommended alternate day dressing with compression and use Hibiclens and Dakin's solution with acetic acid on his wounds. 07/30/2016 -- he has still not got his custom-made compression stockings and I have urged him to go and get him self measured for  these. 08/06/2016 -- he has been measured for his custom stocking and will get in 2 weeks. He has lost 20 pounds since March 08/27/2016 -- he has not got his custom stockings yet and he tried another pair which has not fit well and he has had Shawn lot of maceration increase the size of his wound. 09/03/2016 -- the patient says that he has not been very compliant with his dressing changes and his elevation and exercise and this week he is notices wound get rather large with maceration. 09/18/16; according to our intake nurse the measurements on this patient's wound are slightly larger. I note previous compliance concerns. I note that his wounds measured larger last week which was noted by Dr. Meyer Russel. Culture done last week was negative. 10/01/2016 -- the patient has received some lymphedema pumps which are new and he says he is being compliant with this. He is going to be away for 2 weeks on vacation to [facility] 10/15/2016 -- he returns after 2 weeks and tells me he has been diligent with his dressing and has lost 25 pounds over the last 3 months. 10/22/2016 -- his last hemoglobin A1c was 6.2 and he is now diet controlled. 11/19/2016 -- he has been having problems with his compression stockings which are custom made at [facility]. He has not yet been able to get them. He had taken out his compression because he had gone there and had no dressing on when he came here today READMISSION 05/22/2018 [name] is Shawn now 40 year old man with Shawn Murillo history of wounds in his lower extremities secondary to chronic venous insufficiency with secondary lymphedema. He has had previous vein ligations on the right although I do not have this information in front of me. As I understand things he also has central venous obstruction with Shawn vein bypass in 2005 I believe this was done at [facility] He tells me over the last 2 months he has noted mid reopening in the right mid anterior lower extremity. This is Shawn rectangular  shaped wound which is kind of odd in terms of how that formed. He has been using silver alginate and Unna boots that we used on him when he was here in 2018. He buys his product supply online thinks this is cheaper than ordering through intermediary's Past medical history; CHF, morbid obesity, hypertension, hyperlipidemia, chronic venous insufficiency, vein bypass in 2005 previous vein ablations or ligations. He has Shawn stent in the right lower extremity. ABI in this clinic was 1.04. He is using his compression pumps at home 3/27; 2-week follow-up. Right lower extremity wounds related to chronic venous insufficiency and lymphedema. He has been using silver alginate under an Radio broadcast assistant. He change the [employer] himself at home last week. He is making nice progress 4/10; 2-week follow-up. Right lower extremity wound is just about closed Shawn small open area in the middle of the and large rectangular wound he had at the  beginning. His edema control is excellent. He says he is using his compression pumps religiously 4/17; 2-week follow-up. Right lower extremity wound is totally closed. He had Shawn large rectangular wound which is progressively closed. His edema control is very good. He is using his compression pumps once to twice Shawn day READMISSION 09/19/2020 [name] is now Shawn 40 year old man we have had for several stays in this clinic including 2017, 2018 and most recently in 2020 with Shawn wound on his right lower leg. He has compression pumps which he claims to be using twice Shawn day. He also has stockings however I am not sure how much he uses them. Things have broken down over the last month or 2 he has an extensive wound area on the right mid tibial area I think this is similar to what he has had in the past. He has been using Xeroform and an Ace wrap that his girlfriend is applying. Past medical history; the patient has known chronic venous insufficiency with secondary lymphedema. He had an iliac vein blockage  and he had Shawn bypass at [facility] although the patient is not followed up with them. The surgeon may have been Dr. Jacolyn Reedy. As noted he has lymphedema pumps. Type 2 diabetes congestive heart failure obstructive sleep apnea. He has Shawn history of Shawn right leg DVT although this may have been the iliac vein he is not currently on anticoagulation ABI in our clinic is 1.37 on the right Imaging Results - in this encounter CT Pelvic Venogram Run Off Imaging Results - CT Pelvic Venogram Run Off Impressions Performed At -Limited study secondary to poor opacification of venous structures. Nodefinite evidence of acute deep vein thrombosis. 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 -Extensive right iliac chain and right inguinal adenopathy, favored to bereactive/secondary to congestion. [facility] Imaging Results - CT Pelvic Venogram Run Off Narrative Performed At EXAM: CT PELVIC VENOGRAM RUN OFF DATE: 04/11/2016 1:28 PM ACCESSION: [id number] UN DICTATED: 04/11/2016 2:18 PM INTERPRETATION LOCATION: [facility] CLINICAL INDICATION: 40 years old Male with h/o RLE DVT and massive lymphedema of both LE's. Eval for venous outflow obstruction vs extrinsic mass- L97.203-Skin ulcer of calf with necrosis of muscle, unspecified laterality(RAF-HCC) COMPARISON: CT abdomen pelvis dated 02/11/2007 TECHNIQUE: Shawn spiral CT scan was obtained approximately 3 minutes after administration of IV contrast from the kidneys to the knees.Images were reconstructed in the axial plane. Multiplanar reformatted and MIP images were provided for further evaluation of the vessels. For selected cases, 3D volumerendered images are also provided. VASCULAR FINDINGS: There is overall poor contrast opacification of the venous structures, limitingevaluation. IVC: No evidence of thrombus. Iliac veins: The right iliac veins are chronically occluded/diminutive  in size. There are extensive venous collaterals noted throughout the right pelvis extending into the right lower extremity. The number and caliber of the venous collaterals have increased when compared to 02/11/2007. The left iliac veins appear patent. There are also small caliber venous collaterals within the left pelvis extending into the left lower extremity. Anterior abdominal wall venouscollaterals are also noted but fully imaged secondary to patient diameter. The bilateral femoral and popliteal veins appear patent. There is no evidence of acute thrombus. The arterial vasculature is unremarkable on this venous phase time study. NONVASCULAR FINDINGS: LOWER CHEST Unremarkable. : ABDOMEN: HEPATOBILIARY: Unremarkable liver. No biliary ductal dilatation. Gallbladder isremarkable. PANCREAS: Unremarkable. SPLEEN: Unremarkable. ADRENAL GLANDS: Unremarkable. KIDNEYS/URETERS: Unremarkable. BLADDER: Unremarkable. BOWEL/PERITONEUM/RETROPERITONEUM: No bowel obstruction. No acute  inflammatoryprocess. No ascites. LYMPH NODES: Extensive right iliac chain and right inguinal adenopathy. Shawn nodal conglomerate in the right inguinal region measures 6.8 x 3.4 cm (2:124). This is only slightly larger when compared to 02/11/2007. Given interval stabilityover 10 years, these are favored to be reactive/secondary to congestion. REPRODUCTIVE: Unremarkable. BONES/SOFT TISSUES: No acute osseous abnormalities. Postsurgical changes in the right inguinal and left medial thigh region. Right greater than left softtissue edema throughout the lower extremities. [facility] Imaging Results - CT Pelvic Venogram Run Off Procedure Note Interface, Rad Results In - 04/11/2016 3:28 PM EST EXAM: CT PELVIC VENOGRAM RUN OFF DATE: 04/11/2016 1:28 PM ACCESSION: [id number] UN DICTATED: 04/11/2016 2:18 PM INTERPRETATION LOCATION: [facility] CLINICAL INDICATION: 40 years old Male with h/o RLE DVT and massive lymphedema of both LE's. Eval  for venous outflow obstruction vs extrinsic mass- L97.203-Skin ulcer of calf with necrosis of muscle, unspecified laterality (RAF-HCC) COMPARISON: CT abdomen pelvis dated 02/11/2007 TECHNIQUE: Shawn spiral CT scan was obtained approximately 3 minutes after administration of IV contrast from the kidneys to the knees. Images were reconstructed in the axial plane. Multiplanar reformatted and MIP images were provided for further evaluation of the vessels. For selected cases, 3D volume rendered images are also provided. VASCULAR FINDINGS: There is overall poor contrast opacification of the venous structures, limiting evaluation. IVC: No evidence of thrombus. Iliac veins: The right iliac veins are chronically occluded/diminutive in size. There are extensive venous collaterals noted throughout the right pelvis extending into the right lower extremity. The number and caliber of the venous collaterals have increased when compared to 02/11/2007. The left iliac veins appear patent. There are also small caliber venous collaterals within the left pelvis extending into the left lower extremity. Anterior abdominal wall venous collaterals are also noted but fully imaged secondary to patient diameter. The bilateral femoral and popliteal veins appear patent. There is no evidence of acute thrombus. The arterial vasculature is unremarkable on this venous phase time study. NONVASCULAR FINDINGS: LOWER CHEST Unremarkable. : ABDOMEN: HEPATOBILIARY: Unremarkable liver. No biliary ductal dilatation. Gallbladder is remarkable. PANCREAS: Unremarkable. SPLEEN: Unremarkable. ADRENAL GLANDS: Unremarkable. KIDNEYS/URETERS: Unremarkable. BLADDER: Unremarkable. BOWEL/PERITONEUM/RETROPERITONEUM: No bowel obstruction. No acute inflammatory process. No ascites. LYMPH NODES: Extensive right iliac chain and right inguinal adenopathy. Shawn nodal conglomerate in the right inguinal region measures 6.8 x 3.4 cm (2:124). This is only  slightly larger when compared to 02/11/2007. Given interval stability over 10 years, these are favored to be reactive/secondary to congestion. REPRODUCTIVE: Unremarkable. BONES/SOFT TISSUES: No acute osseous abnormalities. Postsurgical changes in the right inguinal and left medial thigh region. Right greater than left soft tissue edema throughout the lower extremities. IMPRESSION: -Limited study secondary to poor opacification of venous structures. No definite evidence of acute deep vein thrombosis. 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 -Extensive right iliac chain and right inguinal adenopathy, favored to be reactive/secondary to congestion. Imaging Results - CT Pelvic Venogram Run Off Performing Organization Address City/State/Zipcode Phone Number [facility] [name] [place], [place] [address] Surgical summary as listed below; Assessment: [name] is Shawn 40 y.o. male with Laraia history of right lower extremity chronic obstructive deep venous disease with recurrent ulceration, absence of right iliac vein, prior left to right femoral vein bypass. Patient continues with recurrent ulceration, his weight precludes him from further vascular studies at this time. If he is able to lose 50-100 pounds we would be able to do left lower extremity  venogram possible intervention with 50% chance of re- opening occlusion per Dr. Jacolyn ReedyMarston. For global health, weight loss, potential improvement in his chronic venous insufficiency would appreciateinput from bariatric surgery group for recommendations. 09/26/20; patient comes in today with not much improvement. He has also some odor. 3 open areas and this linear area in his mid calf require debridement. We have been using silver alginate under compression. I gave him Keflex last week for what I thought was cellulitis in the right medial thigh he is completing this and  I think this is better. 7/26; patient comes in today with again necrotic debris over these wounds. According to our intake nurse extreme malodor and drainage. We have been using silver alginate under compression. He is complaining about pain in the wound area. 7/29; Patient presents for follow-up. He reports improvement to the wound. He has finished taking Keflex. He has no issues or complaints today. Electronic Signature(s) Signed: 10/06/2020 12:52:39 PM By: Geralyn CorwinHoffman, Mayling Aber DO Entered By: Geralyn CorwinHoffman, Soloman Mckeithan on 10/06/2020 12:45:59 -------------------------------------------------------------------------------- Physical Exam Details Patient Name: Date of Service: [name], Shawn Oppenheim DRIA N [name]. 10/06/2020 11:15 [name] Medical Record Number: [id number] Patient Account Number: [id number] Date of Birth/Sex: Treating RN: March 07, 1981 (40 y.o. Shawn SchoonerM) Boehlein, Linda Primary Care Provider: Marcy SirenWallace, Catherine Other Clinician: Referring Provider: Treating Provider/Extender: Shawn ConnorsHoffman, Gorge Almanza Wallace, Catherine Weeks in Treatment: 2 Constitutional respirations regular, non-labored and within target range for patient.. Cardiovascular 2+ dorsalis pedis/posterior tibialis pulses. Psychiatric pleasant and cooperative. Notes Right lower extremity: Open wound with granulation tissue and Some darkened patches [name] the margins. No increased warmth, erythema or purulent drainage o noted. Electronic Signature(s) Signed: 10/06/2020 12:52:39 PM By: Geralyn CorwinHoffman, Edgard Debord DO Entered By: Geralyn CorwinHoffman, Drezden Seitzinger on 10/06/2020 12:48:14 -------------------------------------------------------------------------------- Physician Orders Details Patient Name: Date of Service: [name], Shawn DRIA N [name]. 10/06/2020 11:15 [name] Medical Record Number: [id number] Patient Account Number: [id number] Date of Birth/Sex: Treating RN: March 07, 1981 (40 y.o. Shawn SchoonerM) Boehlein, Linda Primary Care Provider: Marcy SirenWallace, Catherine Other Clinician: Referring Provider: Treating  Provider/Extender: Shawn ConnorsHoffman, Tyrisha Benninger Wallace, Catherine Weeks in Treatment: 2 Verbal / Phone Orders: No Diagnosis Coding ICD-10 Coding Code Description [id number] Chronic venous hypertension (idiopathic) with ulcer and inflammation of right lower extremity I89.0 Lymphedema, not elsewhere classified L97.818 Non-pressure chronic ulcer of other part of right lower leg with other specified severity Follow-up Appointments ppointment in 2 weeks. - Tuesday Return Shawn Nurse Visit: - Tuesday Bathing/ Shower/ Hygiene May shower with protection but do not get wound dressing(s) wet. - May use Shawn cast wrap to put over wraps to shower; [facility] these [facility] or [facility] Edema Control - Lymphedema / SCD / Other Lymphedema Pumps. Use Lymphedema pumps on leg(s) 2-3 times Shawn day for 45-60 minutes. If wearing any wraps or hose, do not remove them. Continue exercising as instructed. Elevate legs to the level of the heart or above for 30 minutes daily and/or when sitting, Shawn frequency of: Avoid standing for Makela periods of time. Patient to wear own compression stockings every day. - on Left Leg Wound Treatment Wound #32 - Lower Leg Wound Laterality: Right, Medial Cleanser: Soap and Water 2 x Per Week/15 Days Discharge Instructions: May shower and wash wound with dial antibacterial soap and water prior to dressing change. Cleanser: Wound Cleanser (Generic) 2 x Per Week/15 Days Discharge Instructions: Cleanse the wound with wound cleanser prior to applying Shawn clean dressing using gauze sponges, not tissue or cotton balls. Peri-Wound Care: Triamcinolone 15 (g) 2 x Per Week/15 Days Discharge Instructions: Use triamcinolone  15 (g) as directed Peri-Wound Care: Sween Lotion (Moisturizing lotion) 2 x Per Week/15 Days Discharge Instructions: Apply moisturizing lotion as directed Prim Dressing: KerraCel Ag Gelling Fiber Dressing, 4x5 in (silver alginate) (Generic) 2 x Per Week/15 Days ary Discharge Instructions: Apply  silver alginate to wound bed as instructed Secondary Dressing: Woven Gauze Sponge, Non-Sterile 4x4 in (Generic) 2 x Per Week/15 Days Discharge Instructions: Apply over primary dressing as directed. Secondary Dressing: ABD Pad, 5x9 (Generic) 2 x Per Week/15 Days Discharge Instructions: Apply over primary dressing as directed. Secondary Dressing: Zetuvit Plus 4x8 in (Generic) 2 x Per Week/15 Days Discharge Instructions: Apply over primary dressing as directed. Compression Wrap: FourPress (4 layer compression wrap) (Generic) 2 x Per Week/15 Days Discharge Instructions: Apply four layer compression as directed. May also use Miliken CoFlex 2 layer compression system as alternative. Wound #33 - Lower Leg Wound Laterality: Right, Lateral Cleanser: Soap and Water 1 x Per Week/15 Days Discharge Instructions: May shower and wash wound with dial antibacterial soap and water prior to dressing change. Cleanser: Wound Cleanser (Generic) 1 x Per Week/15 Days Discharge Instructions: Cleanse the wound with wound cleanser prior to applying Shawn clean dressing using gauze sponges, not tissue or cotton balls. Peri-Wound Care: Triamcinolone 15 (g) 1 x Per Week/15 Days Discharge Instructions: Use triamcinolone 15 (g) as directed Peri-Wound Care: Sween Lotion (Moisturizing lotion) 1 x Per Week/15 Days Discharge Instructions: Apply moisturizing lotion as directed Topical: Gentamicin 1 x Per Week/15 Days Discharge Instructions: As directed by physician Prim Dressing: KerraCel Ag Gelling Fiber Dressing, 4x5 in (silver alginate) (Generic) 1 x Per Week/15 Days ary Discharge Instructions: Apply silver alginate to wound bed as instructed Secondary Dressing: Woven Gauze Sponge, Non-Sterile 4x4 in (Generic) 1 x Per Week/15 Days Discharge Instructions: Apply over primary dressing as directed. Secondary Dressing: ABD Pad, 5x9 (Generic) 1 x Per Week/15 Days Discharge Instructions: Apply over primary dressing as  directed. Secondary Dressing: Zetuvit Plus 4x8 in (Generic) 1 x Per Week/15 Days Discharge Instructions: Apply over primary dressing as directed. Compression Wrap: FourPress (4 layer compression wrap) (Generic) 1 x Per Week/15 Days Discharge Instructions: Apply four layer compression as directed. May also use Miliken CoFlex 2 layer compression system as alternative. Patient Medications llergies: peanut, lidocaine Shawn Notifications Medication Indication Start End 10/06/2020 Keflex DOSE 1 - oral 500 mg capsule - 1 capsule twice daily for 5 days Electronic Signature(s) Signed: 10/06/2020 12:51:48 PM By: Geralyn Corwin DO Entered By: Geralyn Corwin on 10/06/2020 12:51:46 -------------------------------------------------------------------------------- Problem List Details Patient Name: Date of Service: [name], Shawn Oppenheim [name]. 10/06/2020 11:15 [name] Medical Record Number: [id number] Patient Account Number: [id number] Date of Birth/Sex: Treating RN: 1980-07-03 (40 y.o. [name] Primary Care Provider: Marcy [name] Other Clinician: Referring Provider: Treating Provider/Extender: [name] in Treatment: 2 Active Problems ICD-10 Encounter Code Description Active Date MDM Diagnosis I87.331 Chronic venous hypertension (idiopathic) with ulcer and inflammation of right 09/19/2020 No Yes lower extremity I89.0 Lymphedema, not elsewhere classified 09/19/2020 No Yes L97.818 Non-pressure chronic ulcer of other part of right lower leg with other specified 09/19/2020 No Yes severity Inactive Problems Resolved Problems Electronic Signature(s) Signed: 10/06/2020 12:52:39 PM By: Geralyn Corwin DO Entered By: Geralyn Corwin on 10/06/2020 12:43:30 -------------------------------------------------------------------------------- Progress Note Details Patient Name: Date of Service: [name], Shawn Oppenheim [name]. 10/06/2020 11:15 [name] Medical Record Number:  [id number] Patient Account Number: [id number] Date of Birth/Sex: Treating RN: May 02, 1980 (40 y.o. 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 Quality: very chronic but clean Duration: over 1 year Modifying Factors: r iliac obs or absence, obesity The patient is Shawn 40 yrs old bm here for evaluation of his right leg ulcer. He has an extensive history of lymphedema and ulcers. He is being treated at the [facility] by Dr. [name] with [name] boots and the [facility]. He has pumps at home but he is only using them once Shawn day. 08/30/14 selective debridement done of surface eschar. The wound cleans up quite nicely in the bed of this looks healthy. He is using Shawn wound VAC under an Unna wrap. 11/17/14; selective debridement done of surface eschar. Once again the wound beds at clean up quite nicely. He is no longer using Shawn wound VAC. Recent dressing changes include Hydrofera Blue. Apparently he has done Shawn wide variety of different topical dressings including advanced treatment options like Apligraf's without success. 03/21/15; surgical debridement done of surface eschar and nonviable subcutaneous tissue. The area on the medial aspect of the right leg has closed and the wound on the lateral right leg looks improved has been using Silver Collagen 04/11/15; I think attendance here is sporadic. The area on the right medial leg remains healed. He has Shawn new tiny area on the  back of the right leg. Again Shawn surgical debridement of the surface eschar and nonviable subcutaneous tissue of the major wound on the right lateral leg. He has been using Silver Collagen and at home, he does his own Unna wraps at home edema control seems reasonable. 04/20/15; the area on the right medial leg has Shawn very superficial area which may not even be open nevertheless I felt needed to be dressed today [this is his original chronic wound] the new wound is on the right anterior lateral leg is underwent Shawn surgical debridement. He has Shawn small wound on the right posterior leg. He puts on his own Unna boots, according to our nurses he does this fairly well. We have been using Silver collagen. 05/02/2015 -- I understand in the past his venous studies have been done at [facility] and he was advised weight loss before they would attempt to tackle his iliac vein blockage. He has not gone back for review. 06/27/2015 -- we have applied for Apligraf and are awaiting his insurance clearance. 07/25/2015 -- he still has to use her back from his insurance agent regarding his copayment for his Apligraf. 07/31/2015 -- the patient got Shawn reply from the insurance agent regarding the dollar payment for his application but is not sure whether it is for 5 or for one. 11/28/2015 -- has been hurting Shawn little bit more and he has had change in color of his wound especially on the medial part. 12/05/2015 -- his culture was positive for Proteus mirabilis and K Oxytoca which are sensitive to ciprofloxacin which he is already on. 12/12/15oostill on ciprofloxacin. His mother reports of dressing had to be changed last Friday due to odor. He has not been systemically unwell 12/19/15; patient came in today  complaining of increasing pain and tightness in his upper right thigh. He completed the ciprofloxacin 2 or 3 days ago. He is not running Shawn fever today. 12/26/2015 -- last week he had been seen by Dr. [name] who got Shawn lower  extremity venous duplex evaluation done which did not show DVT or SVT in the right lower extremity. he had also put him on Augmentin in addition to the previous ciprofloxacin and he had received for 2 weeks 01/02/2016 -- -- was admitted to the hospital on 12/26/2015 and discharged on 12/29/2015 and was treated for cellulitis. He was also newly diagnosed with type 2 diabetes mellitus and outpatient monitoring and initiation of treatment was recommended. Patientoos hemoglobin A1c was 6.6 No osteomyelitis detected on x-rays and recent Doppler study was negative for DVT Oral doxycycline and Levaquin were recommended for the patient as an . outpatient for Shawn 14 day treatment. IV Zosyn and vancomycin was given due to concerns of Pseudomonas infection while he was in hospital. 02/13/2016 -- he has not gone back to [facility] where he had his vascular opinion earlier and I have urged him to regroup with them to see if there is any surgical options available. 02/27/2016 -- has an appointment to see [facility] vascular surgeons on January 3 and may be seeing Dr. Verdie Drown 03/19/2016 -- I was not able to find any notes on Epic but the patient did go to the vascular clinic at [facility] and saw the PA to Dr. Verdie Drown. I understand she has recommended Shawn CT scan and MRI to be done on February 1 and they would review this with him on the same day. 04/09/2016 -- was admitted to the hospital on 03/20/2016 acute respiratory failure with hypoxia, abdominal discomfort with erythema, hypertensive urgency and chronic wound of the right leg with morbid obesity. he was discharged home on 04/05/2016 and was to continue on IV antibiotics for 18 more days follow-up with the wound center and continue with his cardiologist. he has lost approximately 130 pounds and diuresed over 48 L. He was treated with IV Zosyn and ID recommended he continue this for 3 weeks more. The notes from [facility] wound clinic were noted  where he was seen by the PA and she had taken cultures which grew MSSA and Pseudomonas. She had recommended edema control with Shawn 4-layer compression and also referred him to the lymphedema clinic. Shawn CT venogram was also planned for the future. 04/16/2016 -- at [facility] Shawn CT pelvic venogram runoff was done on 04/11/2016 -- IMPRESSION:-Limited study secondary to poor opacification of venous structures. No definite evidence of acute deep vein thrombosis. 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 -Extensive right iliac chain and right inguinal adenopathy, favored to be reactive/secondary to congestion. The patient continues on his IV antibiotics through his PICC line and has his cardiology opinion and also Shawn bariatric surgery opinion coming up. 04/30/2016 -- He has completed his IV antibiotics through his PICC line. 05/14/2016 --he was seen by the PA, Ms Sluss, recommended alternate day dressing with compression and use Hibiclens and Dakin's solution with acetic acid on his wounds. 07/30/2016 -- he has still not got his custom-made compression stockings and I have urged him to go and get him self measured for these. 08/06/2016 -- he has been measured for his custom stocking and will get in 2 weeks. He has lost 20 pounds since  March 08/27/2016 -- he has not got his custom stockings yet and he tried another pair which has not fit well and he has had Shawn lot of maceration increase the size of his wound. 09/03/2016 -- the patient says that he has not been very compliant with his dressing changes and his elevation and exercise and this week he is notices wound get rather large with maceration. 09/18/16; according to our intake nurse the measurements on this patient's wound are slightly larger. I note previous compliance concerns. I note that his wounds measured larger last week which was noted by Dr. Meyer Russel. Culture  done last week was negative. 10/01/2016 -- the patient has received some lymphedema pumps which are new and he says he is being compliant with this. He is going to be away for 2 weeks on vacation to [facility] 10/15/2016 -- he returns after 2 weeks and tells me he has been diligent with his dressing and has lost 25 pounds over the last 3 months. 10/22/2016 -- his last hemoglobin A1c was 6.2 and he is now diet controlled. 11/19/2016 -- he has been having problems with his compression stockings which are custom made at [facility]. He has not yet been able to get them. He had taken out his compression because he had gone there and had no dressing on when he came here today READMISSION 05/22/2018 [name] is Shawn now 40 year old man with Shawn [place] history of wounds in his lower extremities secondary to chronic venous insufficiency with secondary lymphedema. He has had previous vein ligations on the right although I do not have this information in front of me. As I understand things he also has central venous obstruction with Shawn vein bypass in 2005 I believe this was done at [facility]. He tells me over the last 2 months he has noted mid reopening in the right mid anterior lower extremity. This is Shawn rectangular shaped wound which is kind of odd in terms of how that formed. He has been using silver alginate and Unna boots that we used on him when he was here in 2018. He buys his product supply online thinks this is cheaper than ordering through intermediary's Past medical history; CHF, morbid obesity, hypertension, hyperlipidemia, chronic venous insufficiency, vein bypass in 2005 previous vein ablations or ligations. He has Shawn stent in the right lower extremity. ABI in this clinic was 1.04. He is using his compression pumps at home 3/27; 2-week follow-up. Right lower extremity wounds related to chronic venous insufficiency and lymphedema. He has been using silver alginate under an Radio broadcast assistant. He change the [employer]  himself at home last week. He is making nice progress 4/10; 2-week follow-up. Right lower extremity wound is just about closed Shawn small open area in the middle of the and large rectangular wound he had at the beginning. His edema control is excellent. He says he is using his compression pumps religiously 4/17; 2-week follow-up. Right lower extremity wound is totally closed. He had Shawn large rectangular wound which is progressively closed. His edema control is very good. He is using his compression pumps once to twice Shawn day READMISSION 09/19/2020 [name] is now Shawn 40 year old man we have had for several stays in this clinic including 2017, 2018 and most recently in 2020 with Shawn wound on his right lower leg. He has compression pumps which he claims to be using twice Shawn day. He also has stockings however I am not sure how much he uses them. Things have  broken down over the last month or 2 he has an extensive wound area on the right mid tibial area I think this is similar to what he has had in the past. He has been using Xeroform and an Ace wrap that his girlfriend is applying. Past medical history; the patient has known chronic venous insufficiency with secondary lymphedema. He had an iliac vein blockage and he had Shawn bypass at [facility] although the patient is not followed up with them. The surgeon may have been Dr. Jacolyn Reedy. As noted he has lymphedema pumps. Type 2 diabetes congestive heart failure obstructive sleep apnea. He has Shawn history of Shawn right leg DVT although this may have been the iliac vein he is not currently on anticoagulation ABI in our clinic is 1.37 on the right Imaging Results - in this encounter CT Pelvic Venogram Run Off Imaging Results - CT Pelvic Venogram Run Off Impressions Performed At -Limited study secondary to poor opacification of venous structures. Nodefinite evidence of acute deep vein thrombosis. 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 -Extensive right iliac chain and right inguinal adenopathy, favored to bereactive/secondary to congestion. [facility] Imaging Results - CT Pelvic Venogram Run Off Narrative Performed At EXAM: CT PELVIC VENOGRAM RUN OFF DATE: 04/11/2016 1:28 PM ACCESSION: [id number] UN DICTATED: 04/11/2016 2:18 PM INTERPRETATION LOCATION: [facility] CLINICAL INDICATION: 40 years old Male with h/o RLE DVT and massive lymphedema of both LE's. Eval for venous outflow obstruction vs extrinsic mass- L97.203-Skin ulcer of calf with necrosis of muscle, unspecified laterality(RAF-HCC) COMPARISON: CT abdomen pelvis dated 02/11/2007 TECHNIQUE: Shawn spiral CT scan was obtained approximately 3 minutes after administration of IV contrast from the kidneys to the knees. Images were reconstructed in the axial plane. Multiplanar reformatted and MIP images were provided for further evaluation of the vessels. For selected cases, 3D volumerendered images are also provided. VASCULAR FINDINGS: There is overall poor contrast opacification of the venous structures, limitingevaluation. IVC: No evidence of thrombus. Iliac veins: The right iliac veins are chronically occluded/diminutive in size. There are extensive venous collaterals noted throughout the right pelvis extending into the right lower extremity. The number and caliber of the venous collaterals have increased when compared to 02/11/2007. The left iliac veins appear patent. There are also small caliber venous collaterals within the left pelvis extending into the left lower extremity. Anterior abdominal wall venouscollaterals are also noted but fully imaged secondary to patient diameter. The bilateral femoral and popliteal veins appear patent. There is no evidence of acute thrombus. The arterial vasculature is unremarkable on this venous phase time study. NONVASCULAR FINDINGS: LOWER CHEST  Unremarkable. : ABDOMEN: HEPATOBILIARY: Unremarkable liver. No biliary ductal dilatation. Gallbladder isremarkable. PANCREAS: Unremarkable. SPLEEN: Unremarkable. ADRENAL GLANDS: Unremarkable. KIDNEYS/URETERS: Unremarkable. BLADDER: Unremarkable. BOWEL/PERITONEUM/RETROPERITONEUM: No bowel obstruction. No acute inflammatoryprocess. No ascites. LYMPH NODES: Extensive right iliac chain and right inguinal adenopathy. Shawn nodal conglomerate in the right inguinal region measures 6.8 x 3.4 cm (2:124). This is only slightly larger when compared to 02/11/2007. Given interval stabilityover 10 years, these are favored to be reactive/secondary to congestion. REPRODUCTIVE: Unremarkable. BONES/SOFT TISSUES: No acute osseous abnormalities. Postsurgical changes in the right inguinal and left medial thigh region. Right greater than left softtissue edema throughout the lower extremities. [facility] Imaging Results - CT Pelvic Venogram Run Off Procedure Note Interface, Rad Results In - 04/11/2016 3:28 PM EST EXAM: CT PELVIC VENOGRAM RUN OFF DATE: 04/11/2016 1:28 PM ACCESSION: [id number] UN DICTATED: 04/11/2016 2:18  PM INTERPRETATION LOCATION: [facility] CLINICAL INDICATION: 40 years old Male with h/o RLE DVT and massive lymphedema of both LE's. Eval for venous outflow obstruction vs extrinsic mass- L97.203-Skin ulcer of calf with necrosis of muscle, unspecified laterality (RAF-HCC) COMPARISON: CT abdomen pelvis dated 02/11/2007 TECHNIQUE: Shawn spiral CT scan was obtained approximately 3 minutes after administration of IV contrast from the kidneys to the knees. Images were reconstructed in the axial plane. Multiplanar reformatted and MIP images were provided for further evaluation of the vessels. For selected cases, 3D volume rendered images are also provided. VASCULAR FINDINGS: There is overall poor contrast opacification of the venous structures, limiting evaluation. IVC: No evidence of thrombus. Iliac veins: The  right iliac veins are chronically occluded/diminutive in size. There are extensive venous collaterals noted throughout the right pelvis extending into the right lower extremity. The number and caliber of the venous collaterals have increased when compared to 02/11/2007. The left iliac veins appear patent. There are also small caliber venous collaterals within the left pelvis extending into the left lower extremity. Anterior abdominal wall venous collaterals are also noted but fully imaged secondary to patient diameter. The bilateral femoral and popliteal veins appear patent. There is no evidence of acute thrombus. The arterial vasculature is unremarkable on this venous phase time study. NONVASCULAR FINDINGS: LOWER CHEST Unremarkable. : ABDOMEN: HEPATOBILIARY: Unremarkable liver. No biliary ductal dilatation. Gallbladder is remarkable. PANCREAS: Unremarkable. SPLEEN: Unremarkable. ADRENAL GLANDS: Unremarkable. KIDNEYS/URETERS: Unremarkable. BLADDER: Unremarkable. BOWEL/PERITONEUM/RETROPERITONEUM: No bowel obstruction. No acute inflammatory process. No ascites. LYMPH NODES: Extensive right iliac chain and right inguinal adenopathy. Shawn nodal conglomerate in the right inguinal region measures 6.8 x 3.4 cm (2:124). This is only slightly larger when compared to 02/11/2007. Given interval stability over 10 years, these are favored to be reactive/secondary to congestion. REPRODUCTIVE: Unremarkable. BONES/SOFT TISSUES: No acute osseous abnormalities. Postsurgical changes in the right inguinal and left medial thigh region. Right greater than left soft tissue edema throughout the lower extremities. IMPRESSION: -Limited study secondary to poor opacification of venous structures. No definite evidence of acute deep vein thrombosis. 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 -Extensive right iliac chain and  right inguinal adenopathy, favored to be reactive/secondary to congestion. Imaging Results - CT Pelvic Venogram Run Off Performing Organization Address City/State/Zipcode Phone Number [facility] [name] [place], [place] [address] Surgical summary as listed below; Assessment: [name] is Shawn 40 y.o. male with [place] history of right lower extremity chronic obstructive deep venous disease with recurrent ulceration, absence of right iliac vein, prior left to right femoral vein bypass. Patient continues with recurrent ulceration, his weight precludes him from further vascular studies at this time. If he is able to lose 50-100 pounds we would be able to do left lower extremity venogram possible intervention with 50% chance of re- opening occlusion per Dr. Jacolyn Reedy. For global health, weight loss, potential improvement in his chronic venous insufficiency would appreciateinput from bariatric surgery group for recommendations. 09/26/20; patient comes in today with not much improvement. He has also some odor. 3 open areas and this linear area in his mid calf require debridement. We have been using silver alginate under compression. I gave him Keflex last week for what I thought was cellulitis in the right medial thigh he is completing this and I think this is better. 7/26; patient comes in today with again necrotic debris over these wounds. According to our intake nurse extreme malodor  and drainage. We have been using silver alginate under compression. He is complaining about pain in the wound area. 7/29; Patient presents for follow-up. He reports improvement to the wound. He has finished taking Keflex. He has no issues or complaints today. Patient History Information obtained from Patient. Family History Diabetes - Mother, No family history of Cancer, Heart Disease, Hereditary Spherocytosis, Hypertension, Kidney Disease, Lung Disease, Seizures, Stroke, Thyroid Problems, Tuberculosis. Social History Unknown  if ever smoked, Marital Status - Single, Alcohol Use - Rarely, Drug Use - No History. Medical History Eyes Denies history of Cataracts, Glaucoma, Optic Neuritis Ear/Nose/Mouth/Throat Denies history of Chronic sinus problems/congestion, Middle ear problems Hematologic/Lymphatic Patient has history of Lymphedema - right leg Denies history of Anemia, Hemophilia, Human Immunodeficiency Virus, Sickle Cell Disease Respiratory Denies history of Aspiration, Asthma, Chronic Obstructive Pulmonary Disease (COPD), Pneumothorax, Sleep Apnea, Tuberculosis Cardiovascular Patient has history of Deep Vein Thrombosis - hx right leg, Hypertension, Peripheral Venous Disease - venous insufficiency and varicosities Denies history of Angina, Arrhythmia, Congestive Heart Failure, Coronary Artery Disease, Hypotension, Myocardial Infarction, Peripheral Arterial Disease, Phlebitis, Vasculitis Gastrointestinal Denies history of Cirrhosis , Colitis, Crohnoos, Hepatitis Shawn, Hepatitis B, Hepatitis C Endocrine Patient has history of Type II Diabetes Denies history of Type I Diabetes Genitourinary Denies history of End Stage Renal Disease Immunological Denies history of Lupus Erythematosus, Raynaudoos, Scleroderma Integumentary (Skin) Denies history of History of Burn Musculoskeletal Denies history of Gout, Rheumatoid Arthritis, Osteoarthritis, Osteomyelitis Neurologic Denies history of Dementia, Neuropathy, Quadriplegia, Paraplegia, Seizure Disorder Oncologic Denies history of Received Chemotherapy, Received Radiation Psychiatric Denies history of Anorexia/bulimia, Confinement Anxiety Hospitalization/Surgery History - tonsillectomy. - right knee ligament repair. Medical Shawn Surgical History Notes nd Constitutional Symptoms (General Health) morbid obesity Objective Constitutional respirations regular, non-labored and within target range for patient.. Vitals Time Taken: 11:45 AM, Height: 74 in, Weight: 425  lbs, BMI: 54.6, Temperature: 98.3 F, Pulse: 87 bpm, Respiratory Rate: 22 breaths/min, Blood Pressure: 159/95 mmHg. Cardiovascular 2+ dorsalis pedis/posterior tibialis pulses. Psychiatric pleasant and cooperative. General Notes: Right lower extremity: Open wound with granulation tissue and Some darkened patches [name] the margins. No increased warmth, erythema or o purulent drainage noted. Integumentary (Hair, Skin) Wound #32 status is Open. Original cause of wound was Gradually Appeared. The date acquired was: 07/11/2020. The wound has been in treatment 2 weeks. The wound is located on the Right,Medial Lower Leg. The wound measures 5.8cm length x 23cm width x 0.5cm depth; 104.772cm^2 area and 52.386cm^3 volume. There is Fat Layer (Subcutaneous Tissue) exposed. There is no tunneling or undermining noted. There is Shawn large amount of serosanguineous drainage noted. The wound margin is distinct with the outline attached to the wound base. There is small (1-33%) red, pink granulation within the wound bed. There is Shawn large (67- 100%) amount of necrotic tissue within the wound bed including Adherent Slough. Wound #33 status is Open. Original cause of wound was Gradually Appeared. The date acquired was: 07/10/2020. The wound has been in treatment 2 weeks. The wound is located on the Right,Lateral Lower Leg. The wound measures 0.3cm length x 0.2cm width x 0.1cm depth; 0.047cm^2 area and 0.005cm^3 volume. There is Fat Layer (Subcutaneous Tissue) exposed. There is no tunneling or undermining noted. There is Shawn small amount of serosanguineous drainage noted. The wound margin is flat and intact. There is large (67-100%) red granulation within the wound bed. There is no necrotic tissue within the wound bed. Assessment Active Problems ICD-10 Chronic venous hypertension (idiopathic) with ulcer and inflammation of right lower  extremity Lymphedema, not elsewhere classified Non-pressure chronic ulcer of other part of  right lower leg with other specified severity Patient's wound has shown improvement since last clinic visit. He had Shawn culture that grew showed E. coli, Proteus mirabilis and staph aureus sensitive to Keflex. He has taken 10 days worth I will continue this for 5 more days. He is interested in doing [place] antibiotics and we will send this off. I recommended continuing silver alginate under compression. Procedures Wound #32 Pre-procedure diagnosis of Wound #32 is Shawn Lymphedema located on the Right,Medial Lower Leg . There was Shawn Four Layer Compression Therapy Procedure by Zandra Abts, RN. Post procedure Diagnosis Wound #32: Same as Pre-Procedure Wound #33 Pre-procedure diagnosis of Wound #33 is Shawn Lymphedema located on the Right,Lateral Lower Leg . There was Shawn Four Layer Compression Therapy Procedure by Zandra Abts, RN. Post procedure Diagnosis Wound #33: Same as Pre-Procedure Plan Follow-up Appointments: Return Appointment in 2 weeks. - Tuesday Nurse Visit: - Tuesday Bathing/ Shower/ Hygiene: May shower with protection but do not get wound dressing(s) wet. - May use Shawn cast wrap to put over wraps to shower; you can buy these at [facility] or [facility] Edema Control - Lymphedema / SCD / Other: Lymphedema Pumps. Use Lymphedema pumps on leg(s) 2-3 times Shawn day for 45-60 minutes. If wearing any wraps or hose, do not remove them. Continue exercising as instructed. Elevate legs to the level of the heart or above for 30 minutes daily and/or when sitting, Shawn frequency of: Avoid standing for [place] periods of time. Patient to wear own compression stockings every day. - on Left Leg The following medication(s) was prescribed: Keflex oral 500 mg capsule 1 1 capsule twice daily for 5 days starting 10/06/2020 WOUND #32: - Lower Leg Wound Laterality: Right, Medial Cleanser: Soap and Water 2 x Per Week/15 Days Discharge Instructions: May shower and wash wound with dial antibacterial soap and water prior to  dressing change. Cleanser: Wound Cleanser (Generic) 2 x Per Week/15 Days Discharge Instructions: Cleanse the wound with wound cleanser prior to applying Shawn clean dressing using gauze sponges, not tissue or cotton balls. Peri-Wound Care: Triamcinolone 15 (g) 2 x Per Week/15 Days Discharge Instructions: Use triamcinolone 15 (g) as directed Peri-Wound Care: Sween Lotion (Moisturizing lotion) 2 x Per Week/15 Days Discharge Instructions: Apply moisturizing lotion as directed Prim Dressing: KerraCel Ag Gelling Fiber Dressing, 4x5 in (silver alginate) (Generic) 2 x Per Week/15 Days ary Discharge Instructions: Apply silver alginate to wound bed as instructed Secondary Dressing: Woven Gauze Sponge, Non-Sterile 4x4 in (Generic) 2 x Per Week/15 Days Discharge Instructions: Apply over primary dressing as directed. Secondary Dressing: ABD Pad, 5x9 (Generic) 2 x Per Week/15 Days Discharge Instructions: Apply over primary dressing as directed. Secondary Dressing: Zetuvit Plus 4x8 in (Generic) 2 x Per Week/15 Days Discharge Instructions: Apply over primary dressing as directed. Com pression Wrap: FourPress (4 layer compression wrap) (Generic) 2 x Per Week/15 Days Discharge Instructions: Apply four layer compression as directed. May also use [place] CoFlex 2 layer compression system as alternative. WOUND #33: - Lower Leg Wound Laterality: Right, Lateral Cleanser: Soap and Water 1 x Per Week/15 Days Discharge Instructions: May shower and wash wound with dial antibacterial soap and water prior to dressing change. Cleanser: Wound Cleanser (Generic) 1 x Per Week/15 Days Discharge Instructions: Cleanse the wound with wound cleanser prior to applying Shawn clean dressing using gauze sponges, not tissue or cotton balls. Peri-Wound Care: Triamcinolone 15 (g) 1 x Per Week/15 Days Discharge  Instructions: Use triamcinolone 15 (g) as directed Peri-Wound Care: Sween Lotion (Moisturizing lotion) 1 x Per Week/15 Days Discharge  Instructions: Apply moisturizing lotion as directed Topical: Gentamicin 1 x Per Week/15 Days Discharge Instructions: As directed by physician Prim Dressing: KerraCel Ag Gelling Fiber Dressing, 4x5 in (silver alginate) (Generic) 1 x Per Week/15 Days ary Discharge Instructions: Apply silver alginate to wound bed as instructed Secondary Dressing: Woven Gauze Sponge, Non-Sterile 4x4 in (Generic) 1 x Per Week/15 Days Discharge Instructions: Apply over primary dressing as directed. Secondary Dressing: ABD Pad, 5x9 (Generic) 1 x Per Week/15 Days Discharge Instructions: Apply over primary dressing as directed. Secondary Dressing: Zetuvit Plus 4x8 in (Generic) 1 x Per Week/15 Days Discharge Instructions: Apply over primary dressing as directed. Com pression Wrap: FourPress (4 layer compression wrap) (Generic) 1 x Per Week/15 Days Discharge Instructions: Apply four layer compression as directed. May also use [place]iken CoFlex 2 layer compression system as alternative. 1. Continue silver alginate under compression 2. Follow-up next week for nurse visit Electronic Signature(s) Signed: 10/06/2020 12:52:39 PM By: Geralyn Corwin DO Entered By: Geralyn Corwin on 10/06/2020 12:51:58 -------------------------------------------------------------------------------- HxROS Details Patient Name: Date of Service: [name], Shawn DRIA N [name]. 10/06/2020 11:15 [name] Medical Record Number: [id number] Patient Account Number: [id number] Date of Birth/Sex: Treating RN: 11-29-1980 (40 y.o. [name] Primary Care Provider: Marcy [name] Other Clinician: Referring Provider: Treating Provider/Extender: [name] in Treatment: 2 Information Obtained From Patient Constitutional Symptoms (General Health) Medical History: Past Medical History Notes: morbid obesity Eyes Medical History: Negative for: Cataracts; Glaucoma; Optic Neuritis Ear/Nose/Mouth/Throat Medical  History: Negative for: Chronic sinus problems/congestion; Middle ear problems Hematologic/Lymphatic Medical History: Positive for: Lymphedema - right leg Negative for: Anemia; Hemophilia; Human Immunodeficiency Virus; Sickle Cell Disease Respiratory Medical History: Negative for: Aspiration; Asthma; Chronic Obstructive Pulmonary Disease (COPD); Pneumothorax; Sleep Apnea; Tuberculosis Cardiovascular Medical History: Positive for: Deep Vein Thrombosis - hx right leg; Hypertension; Peripheral Venous Disease - venous insufficiency and varicosities Negative for: Angina; Arrhythmia; Congestive Heart Failure; Coronary Artery Disease; Hypotension; Myocardial Infarction; Peripheral Arterial Disease; Phlebitis; Vasculitis Gastrointestinal Medical History: Negative for: Cirrhosis ; Colitis; Crohns; Hepatitis Shawn; Hepatitis B; Hepatitis C Endocrine Medical History: Positive for: Type II Diabetes Negative for: Type I Diabetes Time with diabetes: 3 years Treated with: Oral agents, Diet Blood sugar tested every day: No Genitourinary Medical History: Negative for: End Stage Renal Disease Immunological Medical History: Negative for: Lupus Erythematosus; Raynauds; Scleroderma Integumentary (Skin) Medical History: Negative for: History of Burn Musculoskeletal Medical History: Negative for: Gout; Rheumatoid Arthritis; Osteoarthritis; Osteomyelitis Neurologic Medical History: Negative for: Dementia; Neuropathy; Quadriplegia; Paraplegia; Seizure Disorder Oncologic Medical History: Negative for: Received Chemotherapy; Received Radiation Psychiatric Medical History: Negative for: Anorexia/bulimia; Confinement Anxiety Immunizations Pneumococcal Vaccine: Received Pneumococcal Vaccination: No Implantable Devices None Hospitalization / Surgery History Type of Hospitalization/Surgery tonsillectomy right knee ligament repair Family and Social History Cancer: No; Diabetes: Yes - Mother; Heart  Disease: No; Hereditary Spherocytosis: No; Hypertension: No; Kidney Disease: No; Lung Disease: No; Seizures: No; Stroke: No; Thyroid Problems: No; Tuberculosis: No; Unknown if ever smoked; Marital Status - Single; Alcohol Use: Rarely; Drug Use: No History; Financial Concerns: No; Food, Clothing or Shelter Needs: No; Support System Lacking: No; Transportation Concerns: No Electronic Signature(s) Signed: 10/06/2020 12:52:39 PM By: Geralyn Corwin DO Signed: 10/06/2020 2:27:43 PM By: Zenaida Deed RN, BSN Entered By: Geralyn Corwin on 10/06/2020 12:46:06 -------------------------------------------------------------------------------- SuperBill Details Patient Name: Date of Service: [name], Shawn Oppenheim [name]. 10/06/2020 Medical Record Number: [id number] Patient  Account Number: [id number] Date of Birth/Sex: Treating RN: 02/24/1981 (40 y.o. Bayard Hugger, Bonita Quin Primary Care Provider: Marcy [name] Other Clinician: Referring Provider: Treating Provider/Extender: [name] in Treatment: 2 Diagnosis Coding ICD-10 Codes Code Description [phone] Chronic venous hypertension (idiopathic) with ulcer and inflammation of right lower extremity I89.0 Lymphedema, not elsewhere classified L97.818 Non-pressure chronic ulcer of other part of right lower leg with other specified severity Facility Procedures CPT4 Code: [id number] (F Description: acility Use Only) [id number] - APPLY MULTLAY COMPRS LWR RT LEG Modifier: Quantity: 1 Physician Procedures : CPT4 Code Description Modifier [id number] - WC PHYS LEVEL 3 - EST PT ICD-10 Diagnosis Description I87.331 Chronic venous hypertension (idiopathic) with ulcer and inflammation of right lower extremity I89.0 Lymphedema, not elsewhere classified  L97.818 Non-pressure chronic ulcer of other part of right lower leg with other specified severity Quantity: 1 Electronic Signature(s) Signed: 10/06/2020 2:15:47 PM By: Geralyn Corwin  DO Signed: 10/06/2020 2:27:43 PM By: Zenaida Deed RN, BSN Previous Signature: 10/06/2020 12:52:39 PM Version By: Geralyn Corwin DO Entered By: Zenaida Deed on 10/06/2020 14:12:22

## 2020-10-06 NOTE — Progress Notes (Signed)
Office Visit    Patient Name: [name] Date of Encounter: 10/06/2020  PCP:  Arvilla Market, MD   [facility]  Cardiologist:  Chilton Si, MD  Advanced Practice Provider:  No care team member to display Electrophysiologist:  None    Chief Complaint    [name] is a 40 y.o. male with a hx of chronic heart failure, hypertension, hyperlipidemia, diabetes, morbid obesity, OSA, venous insufficiency presents today for 05/04/18 by Dr. Duke Salvia   Past Medical History    Past Medical History:  Diagnosis Date   Chronic venous insufficiency    a. as a premature infant, required venous access for care with subsequent DVT and some sort of procedure -> eventually went on to have vein bypass around 2005.   Hypertension    Lymphedema    Morbid obesity (HCC)    OSA on CPAP    Past Surgical History:  Procedure Laterality Date   balloon stenting Right    right leg   VEIN LIGATION AND STRIPPING      Allergies  Allergies  Allergen Reactions   Lidocaine Other (See Comments)    "burns my skin"   Peanut-Containing Drug Products Swelling    Tree nuts    History of Present Illness    [name] is a 40 y.o. male with a hx of chronic heart failure, hypertension, hyperlipidemia, diabetes, morbid obesity, OSA, venous insufficiency last seen 07/07/18 via telemedicine.  He was admitted 03/20/16-04/05/16 for acute heart failure. Echocardiogram was not interpretable due to body habitus. He was diuresed with IV Lasix with negative output of 47.2 L. H e was discharged from 631 pounds on admission to 488 pounds on discharge. His creatnine remained stable.   He was last seen 05/04/18 by Dr. Duke Salvia. He was going to the gym 3-4 days per week. He was wearing Radio broadcast assistant. He was recommended for follow up in 2 months which was not completed.  He presents today for follow up. Notes worsening LE edema over the last month or two. He has taken intermittent  extra Furosemide with improvement. He is also on course of Keflex with wound care with resolution of RLE erythema. He notes weeping to bilateral LE. Reports no shortness of breath at rest. Does endorse dyspnea on exertion. Reports no chest pain, pressure, or tightness. Reports no palpitations.  Tells me he needs new CPAP supplies, he has upcoming appointment with primary care to discuss. He does not remember who initiated his CPAP therapy.   EKGs/Labs/Other Studies Reviewed:   The following studies were reviewed today:   EKG:  EKG is ordered today.  The ekg ordered today demonstrates NSR 95 bpm with no acute ST/T wave changes.   Recent Labs: 09/08/2020: BUN 14; Creatinine, Ser 1.09; Potassium 3.9; Sodium 141  Recent Lipid Panel    Component Value Date/Time   CHOL 174 06/23/2019 1750   TRIG 122 06/23/2019 1750   HDL 41 06/23/2019 1750   CHOLHDL 4.2 06/23/2019 1750   LDLCALC 111 (H) 06/23/2019 1750    Home Medications   Current Meds  Medication Sig   Accu-Chek Softclix Lancets lancets Use as instructed   baclofen (LIORESAL) 10 MG tablet Take 1 tablet (10 mg total) by mouth 3 (three) times daily.   carvedilol (COREG) 12.5 MG tablet Take 1 tablet (12.5 mg total) by mouth 2 (two) times daily.   cephALEXin (KEFLEX) 500 MG capsule Take 500 mg by mouth every 6 (six) hours.  doxycycline (VIBRAMYCIN) 100 MG capsule Take 1 capsule (100 mg total) by mouth 2 (two) times daily.   furosemide (LASIX) 80 MG tablet Take 1 tablet (80 mg total) by mouth 2 (two) times daily.   glucose blood (ACCU-CHEK AVIVA PLUS) test strip Use as instructed   lisinopril (ZESTRIL) 40 MG tablet Take 1 tablet (40 mg total) by mouth daily.   meloxicam (MOBIC) 15 MG tablet Take 1 tablet (15 mg total) by mouth daily.   metFORMIN (GLUCOPHAGE) 500 MG tablet Take 1 tablet (500 mg total) by mouth daily with breakfast.   spironolactone (ALDACTONE) 25 MG tablet Take 1 tablet (25 mg total) by mouth daily.   traMADol (ULTRAM) 50 MG  tablet Take 50 mg by mouth every 6 (six) hours as needed for moderate pain.     Review of Systems    All other systems reviewed and are otherwise negative except as noted above.  Physical Exam    VS:  BP (!) 148/94   Pulse 95   Ht 6\' 2"  (1.88 m)   Wt (!) 490 lb (222.3 kg)   SpO2 95%   BMI 62.91 kg/m  , BMI Body mass index is 62.91 kg/m.  Wt Readings from Last 3 Encounters:  10/06/20 (!) 490 lb (222.3 kg)  09/08/20 (!) 525 lb (238.1 kg)  09/28/19 (!) 522 lb (236.8 kg)     GEN: Well nourished, morbidly obese, well developed, in no acute distress. HEENT: normal. Neck: Supple, no JVD, carotid bruits, or masses. Cardiac: RRR, no murmurs, rubs, or gallops. No clubbing, cyanosis.  Radials/PT 2+ and equal bilaterally.  Respiratory:  Respirations regular and unlabored, clear to auscultation bilaterally. GI: Soft, nontender, nondistended. MS: No deformity or atrophy. Skin: Warm and dry, no rash. RLE in 09/30/19. Bilateral LE with woody edema. Neuro:  Strength and sensation are intact. Psych: Normal affect.  Assessment & Plan    Chronic heart failure, type unknown - Volume status difficult to ascertain due to body habitus. Bilateral LE with woody edema. RLE with Unna boot in place. Weight has been labile at home, daily weight encouraged and log provided. GDMT includes Lisinopril, Carvedilol, Lasix. He does have significant edema to bilateral lower extremity. Start Metolazone 2.5mg  thirty minutes prior to morning Lasix on Fridays and Tuesdays. BM Pin 1 week Continue Furosemide 80mg  BID, Spironolactone 25mg  QD. Future considerations include transition to [facility] or addition of SLGT2i. Discussion regarding fluid restriction <2L and low sodium diet.   HTN - BP elevated. Likely due to volume overload. Addition of Metolazone, as above. Future considerations include transition to [facility] though may be cost prohibitive.   Morbid obesity - Weight loss via diet and exercise encouraged.  Discussed the impact being overweight would have on cardiovascular risk, heart failure, hypertension.   OSA - CPAP compliance encouraged. Reports needing new CPAP supplies. He has upcoming appointment at primary care office to discuss. The CPAP is >82 years old. He is not certain who originally prescribed it. Will route note to primary care to see if they would like him to see Dr. [facility] if needed.   Disposition: Follow up in 3 week(s) with Dr. [facility] or APP.  Signed, 5, NP 10/06/2020, 3:34 PM  [facility]

## 2020-10-09 NOTE — Progress Notes (Signed)
[name], [name] ([id number]) . Visit Report for 10/06/2020 Arrival Information Details Patient Name: Date of Service: [name] 10/06/2020 11:15 [name] Medical Record Number: [id number] Patient Account Number: [id number] Date of Birth/Sex: Treating RN: May 09, 1980 (40 y.o. [name], Emeterio Reeve Primary Care Maily Debarge: Marcy Siren Other Clinician: Referring Gregoire Bennis: Treating Danika Kluender/Extender: Otilio Connors in Treatment: 2 Visit Information History Since Last Visit Added or deleted any medications: No Patient Arrived: Ambulatory Any new allergies or adverse reactions: No Arrival Time: 11:45 Had Shawn fall or experienced change in No Accompanied By: mother activities of daily living that may affect Transfer Assistance: None risk of falls: Patient Identification Verified: Yes Signs or symptoms of abuse/neglect since last visito No Secondary Verification Process Completed: Yes Hospitalized since last visit: No Patient Requires Transmission-Based Precautions: No Implantable device outside of the clinic excluding No Patient Has Alerts: No cellular tissue based products placed in the center since last visit: Has Dressing in Place as Prescribed: Yes Has Compression in Place as Prescribed: Yes Pain Present Now: No Electronic Signature(s) Signed: 10/09/2020 5:51:46 PM By: Zandra Abts RN, BSN Entered By: Zandra Abts on 10/06/2020 11:45:49 -------------------------------------------------------------------------------- Compression Therapy Details Patient Name: Date of Service: [name], [name]. 10/06/2020 11:15 [name] Medical Record Number: [id number] Patient Account Number: [id number] Date of Birth/Sex: Treating RN: 1981/01/10 (40 y.o. [name] Primary Care Valentino Saavedra: Marcy Siren Other Clinician: Referring Jess Toney: Treating Gean Larose/Extender: Otilio Connors in Treatment: 2 Compression Therapy Performed for  Wound Assessment: Wound #33 Right,Lateral Lower Leg Performed By: Clinician Zandra Abts, RN Compression Type: Four Layer Post Procedure Diagnosis Same as Pre-procedure Electronic Signature(s) Signed: 10/06/2020 2:27:43 PM By: Zenaida Deed RN, BSN Entered By: Zenaida Deed on 10/06/2020 12:37:33 -------------------------------------------------------------------------------- Compression Therapy Details Patient Name: Date of Service: [name]. 10/06/2020 11:15 [name] Medical Record Number: [id number] Patient Account Number: [id number] Date of Birth/Sex: Treating RN: [birth year] (40 y.o. [name] Primary Care Witt Plitt: Marcy Siren Other Clinician: Referring Ioanna Colquhoun: Treating Emara Lichter/Extender: Otilio Connors in Treatment: 2 Compression Therapy Performed for Wound Assessment: Wound #32 Right,Medial Lower Leg Performed By: Clinician Zandra Abts, RN Compression Type: Four Layer Post Procedure Diagnosis Same as Pre-procedure Electronic Signature(s) Signed: 10/06/2020 2:27:43 PM By: Zenaida Deed RN, BSN Entered By: Zenaida Deed on 10/06/2020 12:37:33 -------------------------------------------------------------------------------- Encounter Discharge Information Details Patient Name: Date of Service: [name], [name]. 10/06/2020 11:15 [name] Medical Record Number: [id number] Patient Account Number: [id number] Date of Birth/Sex: Treating RN: November 14, 1980 (40 y.o. [name] Primary Care Jessa Stinson: Marcy Siren Other Clinician: Referring Wandalene Abrams: Treating Malakie Balis/Extender: Otilio Connors in Treatment: 2 Encounter Discharge Information Items Discharge Condition: Stable Ambulatory Status: Ambulatory Discharge Destination: Home Transportation: Private Auto Accompanied By: mother Schedule Follow-up Appointment: Yes Clinical Summary of Care: Electronic Signature(s) Signed: 10/06/2020  1:14:47 PM By: Shawn Stall Entered By: Shawn Stall on 10/06/2020 13:04:58 -------------------------------------------------------------------------------- Lower Extremity Assessment Details Patient Name: Date of Service: [name]. 10/06/2020 11:15 [name] Medical Record Number: [id number] Patient Account Number: [id number] Date of Birth/Sex: Treating RN: May 05, 1980 (40 y.o. [name] Primary Care Makai Dumond: Marcy Siren Other Clinician: Referring Orvill Coulthard: Treating Terryl Niziolek/Extender: Otilio Connors in Treatment: 2 Edema Assessment Assessed: [name]: No] [name]: No] Edema: [Left: Ye] [Right: s] Calf Left: Right: Point of Measurement: 36 cm From Medial Instep 60 cm Ankle Left: Right: Point of Measurement: 10 cm From  Medial Instep 34 cm Vascular Assessment Pulses: Dorsalis Pedis Palpable: [Right:Yes] Electronic Signature(s) Signed: 10/09/2020 5:51:46 PM By: Zandra Abts RN, BSN Entered By: Zandra Abts on 10/06/2020 11:57:48 -------------------------------------------------------------------------------- Multi Wound Chart Details Patient Name: Date of Service: [name], [name]. 10/06/2020 11:15 [name] Medical Record Number: [id number] Patient Account Number: [id number] Date of Birth/Sex: Treating RN: 12/21/1980 (40 y.o. [name] Primary Care Khoen Genet: Marcy Siren Other Clinician: Referring Lovie Zarling: Treating Linville Decarolis/Extender: Otilio Connors in Treatment: 2 Vital Signs Height(in): 74 Pulse(bpm): 87 Weight(lbs): 425 Blood Pressure(mmHg): 159/95 Body Mass Index(BMI): 55 Temperature(F): 98.3 Respiratory Rate(breaths/min): 22 Photos: [N/Shawn:N/Shawn] Right, Medial Lower Leg Right, Lateral Lower Leg N/Shawn Wound Location: Gradually Appeared Gradually Appeared N/Shawn Wounding Event: Lymphedema Lymphedema N/Shawn Primary Etiology: Lymphedema, Deep Vein Thrombosis, Lymphedema, Deep Vein  Thrombosis, N/Shawn Comorbid History: Hypertension, Peripheral Venous Hypertension, Peripheral Venous Disease, Type II Diabetes Disease, Type II Diabetes 07/11/2020 07/10/2020 N/Shawn Date Acquired: 2 2 N/Shawn Weeks of Treatment: Open Open N/Shawn Wound Status: No Yes N/Shawn Clustered Wound: N/Shawn 1 N/Shawn Clustered Quantity: 5.8x23x0.5 0.3x0.2x0.1 N/Shawn Measurements L x W x D (cm) 104.772 0.047 N/Shawn Shawn (cm) : rea 52.386 0.005 N/Shawn Volume (cm) : 3.00% 99.00% N/Shawn % Reduction in Area: -21.30% 99.00% N/Shawn % Reduction in Volume: Full Thickness Without Exposed Full Thickness Without Exposed N/Shawn Classification: Support Structures Support Structures Large Small N/Shawn Exudate Amount: Serosanguineous Serosanguineous N/Shawn Exudate Type: red, brown red, brown N/Shawn Exudate Color: Distinct, outline attached Flat and Intact N/Shawn Wound Margin: Small (1-33%) Large (67-100%) N/Shawn Granulation Amount: Red, Pink Red N/Shawn Granulation Quality: Large (67-100%) None Present (0%) N/Shawn Necrotic Amount: Fat Layer (Subcutaneous Tissue): Yes Fat Layer (Subcutaneous Tissue): Yes N/Shawn Exposed Structures: Fascia: No Fascia: No Tendon: No Tendon: No Muscle: No Muscle: No Joint: No Joint: No Bone: No Bone: No Small (1-33%) Medium (34-66%) N/Shawn Epithelialization: Compression Therapy Compression Therapy N/Shawn Procedures Performed: Treatment Notes Electronic Signature(s) Signed: 10/06/2020 12:52:39 PM By: Geralyn Corwin DO Signed: 10/06/2020 2:27:43 PM By: Zenaida Deed RN, BSN Entered By: Geralyn Corwin on 10/06/2020 12:43:39 -------------------------------------------------------------------------------- Pain Assessment Details Patient Name: Date of Service: [name], [name]. 10/06/2020 11:15 [name] Medical Record Number: [id number] Patient Account Number: [id number] Date of Birth/Sex: Treating RN: 12-16-1980 (40 y.o. [name] Primary Care Leandro Berkowitz: Marcy Siren Other Clinician: Referring  Daya Dutt: Treating Melicia Esqueda/Extender: Otilio Connors in Treatment: 2 Active Problems Location of Pain Severity and Description of Pain Patient Has Paino No Site Locations Pain Management and Medication Current Pain Management: Electronic Signature(s) Signed: 10/09/2020 5:51:46 PM By: Zandra Abts RN, BSN Entered By: Zandra Abts on 10/06/2020 11:46:10 -------------------------------------------------------------------------------- Wound Assessment Details Patient Name: Date of Service: [name], [name]. 10/06/2020 11:15 [name] Medical Record Number: [id number] Patient Account Number: [id number] Date of Birth/Sex: Treating RN: 1980-04-03 (40 y.o. [name] Primary Care Nicoletta Hush: Marcy Siren Other Clinician: Referring Aspynn Clover: Treating Shail Urbas/Extender: Otilio Connors in Treatment: 2 Wound Status Wound Number: 32 Primary Lymphedema Etiology: Wound Location: Right, Medial Lower Leg Wound Open Wounding Event: Gradually Appeared Status: Date Acquired: 07/11/2020 Comorbid Lymphedema, Deep Vein Thrombosis, Hypertension, Peripheral Weeks Of Treatment: 2 History: Venous Disease, Type II Diabetes Clustered Wound: No Photos Wound Measurements Length: (cm) 5.8 Width: (cm) 23 Depth: (cm) 0.5 Area: (cm) 104.772 Volume: (cm) 52.386 % Reduction in Area: 3% % Reduction in Volume: -21.3% Epithelialization: Small (1-33%) Tunneling: No Undermining: No Wound Description Classification: Full Thickness Without Exposed Support Structures  Wound Margin: Distinct, outline attached Exudate Amount: Large Exudate Type: Serosanguineous Exudate Color: red, brown Foul Odor After Cleansing: No Slough/Fibrino Yes Wound Bed Granulation Amount: Small (1-33%) Exposed Structure Granulation Quality: Red, Pink Fascia Exposed: No Necrotic Amount: Large (67-100%) Fat Layer (Subcutaneous Tissue) Exposed: Yes Necrotic Quality:  Adherent Slough Tendon Exposed: No Muscle Exposed: No Joint Exposed: No Bone Exposed: No Treatment Notes Wound #32 (Lower Leg) Wound Laterality: Right, Medial Cleanser Soap and Water Discharge Instruction: May shower and wash wound with dial antibacterial soap and water prior to dressing change. Wound Cleanser Discharge Instruction: Cleanse the wound with wound cleanser prior to applying Shawn clean dressing using gauze sponges, not tissue or cotton balls. Peri-Wound Care Triamcinolone 15 (g) Discharge Instruction: Use triamcinolone 15 (g) as directed Sween Lotion (Moisturizing lotion) Discharge Instruction: Apply moisturizing lotion as directed Topical Primary Dressing KerraCel Ag Gelling Fiber Dressing, 4x5 in (silver alginate) Discharge Instruction: Apply silver alginate to wound bed as instructed Secondary Dressing Woven Gauze Sponge, Non-Sterile 4x4 in Discharge Instruction: Apply over primary dressing as directed. ABD Pad, 5x9 Discharge Instruction: Apply over primary dressing as directed. Zetuvit Plus 4x8 in Discharge Instruction: Apply over primary dressing as directed. Secured With Compression Wrap FourPress (4 layer compression wrap) Discharge Instruction: Apply four layer compression as directed. May also use Miliken CoFlex 2 layer compression system as alternative. Compression Stockings Add-Ons Electronic Signature(s) Signed: 10/06/2020 2:27:43 PM By: Zenaida Deed RN, BSN Signed: 10/09/2020 5:51:46 PM By: Zandra Abts RN, BSN Entered By: Zandra Abts on 10/06/2020 11:58:26 -------------------------------------------------------------------------------- Wound Assessment Details Patient Name: Date of Service: [name], Shawn DRIA N [name]. 10/06/2020 11:15 [name] Medical Record Number: [id number] Patient Account Number: [id number] Date of Birth/Sex: Treating RN: 04/02/1980 (40 y.o. [name] Primary Care Akanksha Bellmore: Marcy Siren Other Clinician: Referring  Kerie Badger: Treating Shewanda Sharpe/Extender: Otilio Connors in Treatment: 2 Wound Status Wound Number: 33 Primary Lymphedema Etiology: Wound Location: Right, Lateral Lower Leg Wound Open Wounding Event: Gradually Appeared Status: Date Acquired: 07/10/2020 Comorbid Lymphedema, Deep Vein Thrombosis, Hypertension, Peripheral Weeks Of Treatment: 2 History: Venous Disease, Type II Diabetes Clustered Wound: Yes Photos Wound Measurements Length: (cm) 0.3 Width: (cm) 0.2 Depth: (cm) 0.1 Clustered Quantity: 1 Area: (cm) 0.047 Volume: (cm) 0.005 % Reduction in Area: 99% % Reduction in Volume: 99% Epithelialization: Medium (34-66%) Tunneling: No Undermining: No Wound Description Classification: Full Thickness Without Exposed Support Structures Wound Margin: Flat and Intact Exudate Amount: Small Exudate Type: Serosanguineous Exudate Color: red, brown Foul Odor After Cleansing: No Slough/Fibrino No Wound Bed Granulation Amount: Large (67-100%) Exposed Structure Granulation Quality: Red Fascia Exposed: No Necrotic Amount: None Present (0%) Fat Layer (Subcutaneous Tissue) Exposed: Yes Tendon Exposed: No Muscle Exposed: No Joint Exposed: No Bone Exposed: No Treatment Notes Wound #33 (Lower Leg) Wound Laterality: Right, Lateral Cleanser Soap and Water Discharge Instruction: May shower and wash wound with dial antibacterial soap and water prior to dressing change. Wound Cleanser Discharge Instruction: Cleanse the wound with wound cleanser prior to applying Shawn clean dressing using gauze sponges, not tissue or cotton balls. Peri-Wound Care Triamcinolone 15 (g) Discharge Instruction: Use triamcinolone 15 (g) as directed Sween Lotion (Moisturizing lotion) Discharge Instruction: Apply moisturizing lotion as directed Topical Gentamicin Discharge Instruction: As directed by physician Primary Dressing KerraCel Ag Gelling Fiber Dressing, 4x5 in (silver  alginate) Discharge Instruction: Apply silver alginate to wound bed as instructed Secondary Dressing Woven Gauze Sponge, Non-Sterile 4x4 in Discharge Instruction: Apply over primary dressing as directed. ABD Pad, 5x9 Discharge Instruction:  Apply over primary dressing as directed. Zetuvit Plus 4x8 in Discharge Instruction: Apply over primary dressing as directed. Secured With Compression Wrap FourPress (4 layer compression wrap) Discharge Instruction: Apply four layer compression as directed. May also use Miliken CoFlex 2 layer compression system as alternative. Compression Stockings Add-Ons Electronic Signature(s) Signed: 10/06/2020 2:27:43 PM By: Zenaida DeedBoehlein, Linda RN, BSN Signed: 10/09/2020 5:51:46 PM By: Zandra AbtsLynch, Shatara RN, BSN Entered By: Zandra AbtsLynch, Shatara on 10/06/2020 11:58:55 -------------------------------------------------------------------------------- Vitals Details Patient Name: Date of Service: [name], Shawn DRIA N [name]. 10/06/2020 11:15 [name] Medical Record Number: [id number] Patient Account Number: [id number] Date of Birth/Sex: Treating RN: 08-12-1980 (40 y.o. Shawn KollerM) Lynch, Shatara Primary Care Tomas Schamp: Marcy SirenWallace, Catherine Other Clinician: Referring Rick Warnick: Treating Auryn Paige/Extender: Otilio ConnorsHoffman, Jessica Wallace, Catherine Weeks in Treatment: 2 Vital Signs Time Taken: 11:45 Temperature (F): 98.3 Height (in): 74 Pulse (bpm): 87 Weight (lbs): 425 Respiratory Rate (breaths/min): 22 Body Mass Index (BMI): 54.6 Blood Pressure (mmHg): 159/95 Reference Range: 80 - 120 mg / dl Electronic Signature(s) Signed: 10/09/2020 5:51:46 PM By: Zandra AbtsLynch, Shatara RN, BSN Entered By: Zandra AbtsLynch, Shatara on 10/06/2020 11:46:03

## 2020-10-10 ENCOUNTER — Encounter (HOSPITAL_BASED_OUTPATIENT_CLINIC_OR_DEPARTMENT_OTHER): Payer: 59 | Attending: Internal Medicine | Admitting: Internal Medicine

## 2020-10-10 ENCOUNTER — Other Ambulatory Visit: Payer: Self-pay

## 2020-10-10 ENCOUNTER — Telehealth: Payer: Self-pay | Admitting: Internal Medicine

## 2020-10-10 DIAGNOSIS — Z86718 Personal history of other venous thrombosis and embolism: Secondary | ICD-10-CM | POA: Diagnosis not present

## 2020-10-10 DIAGNOSIS — I872 Venous insufficiency (chronic) (peripheral): Secondary | ICD-10-CM | POA: Insufficient documentation

## 2020-10-10 DIAGNOSIS — E11622 Type 2 diabetes mellitus with other skin ulcer: Secondary | ICD-10-CM | POA: Insufficient documentation

## 2020-10-10 DIAGNOSIS — I890 Lymphedema, not elsewhere classified: Secondary | ICD-10-CM | POA: Insufficient documentation

## 2020-10-10 DIAGNOSIS — Z6841 Body Mass Index (BMI) 40.0 and over, adult: Secondary | ICD-10-CM | POA: Insufficient documentation

## 2020-10-10 DIAGNOSIS — I110 Hypertensive heart disease with heart failure: Secondary | ICD-10-CM | POA: Diagnosis not present

## 2020-10-10 DIAGNOSIS — L97818 Non-pressure chronic ulcer of other part of right lower leg with other specified severity: Secondary | ICD-10-CM | POA: Diagnosis not present

## 2020-10-10 DIAGNOSIS — I509 Heart failure, unspecified: Secondary | ICD-10-CM | POA: Diagnosis not present

## 2020-10-10 NOTE — Progress Notes (Signed)
Wellons, Ryo T ([id number]) . Visit Report for 10/10/2020 SuperBill Details Patient Name: Date of Service: Pierce Crane 10/10/2020 Medical Record Number: [id number] Patient Account Number: [id number] Date of Birth/Sex: Treating RN: 10-26-1980 (40 y.o. [name] Primary Care Provider: Marcy Siren Other Clinician: Referring Provider: Treating Provider/Extender: Otilio Connors in Treatment: 3 Diagnosis Coding ICD-10 Codes Code Description [phone] Chronic venous hypertension (idiopathic) with ulcer and inflammation of right lower extremity I89.0 Lymphedema, not elsewhere classified L97.818 Non-pressure chronic ulcer of other part of right lower leg with other specified severity Facility Procedures CPT4 Code Description Modifier Quantity [id number] (Facility Use Only) [phone] - APPLY MULTLAY COMPRS LWR RT LEG 1 Electronic Signature(s) Signed: 10/10/2020 5:11:13 PM By: Shawn Stall Signed: 10/10/2020 5:41:50 PM By: Geralyn Corwin DO Entered By: Shawn Stall on 10/10/2020 16:58:53

## 2020-10-10 NOTE — Progress Notes (Signed)
[place], Fleetwood T ([id number]) . Visit Report for 10/10/2020 Arrival Information Details Patient Name: Date of Service: [name] 10/10/2020 3:00 PM Medical Record Number: [id number] Patient Account Number: [id number] Date of Birth/Sex: Treating RN: 1981/01/22 (40 y.o. Harlon Flor, Millard.Loa Primary Care Annelie Boak: Marcy Siren Other Clinician: Referring Naziyah Tieszen: Treating Kamaljit Hizer/Extender: Otilio Connors in Treatment: 3 Visit Information History Since Last Visit Added or deleted any medications: No Patient Arrived: Ambulatory Any new allergies or adverse reactions: No Arrival Time: 15:55 Had Shawn fall or experienced change in No Accompanied By: self activities of daily living that may affect Transfer Assistance: None risk of falls: Patient Identification Verified: Yes Signs or symptoms of abuse/neglect since last visito No Secondary Verification Process Completed: Yes Hospitalized since last visit: No Patient Requires Transmission-Based Precautions: No Implantable device outside of the clinic excluding No Patient Has Alerts: No cellular tissue based products placed in the center since last visit: Has Dressing in Place as Prescribed: Yes Has Compression in Place as Prescribed: Yes Pain Present Now: No Electronic Signature(s) Signed: 10/10/2020 5:11:13 PM By: Shawn Stall Entered By: Shawn Stall on 10/10/2020 16:55:53 -------------------------------------------------------------------------------- Compression Therapy Details Patient Name: Date of Service: [name], Shawn Oppenheim T. 10/10/2020 3:00 PM Medical Record Number: [id number] Patient Account Number: [id number] Date of Birth/Sex: Treating RN: August 18, 1980 (40 y.o. [name] Primary Care Zenia Guest: Marcy Siren Other Clinician: Referring Nan Maya: Treating Tashena Ibach/Extender: Otilio Connors in Treatment: 3 Compression Therapy Performed for Wound Assessment: Wound  #32 Right,Medial Lower Leg Performed By: Clinician Shawn Stall, RN Compression Type: Four Layer Electronic Signature(s) Signed: 10/10/2020 5:11:13 PM By: Shawn Stall Entered By: Shawn Stall on 10/10/2020 16:57:00 -------------------------------------------------------------------------------- Compression Therapy Details Patient Name: Date of Service: [name], Shawn Oppenheim T. 10/10/2020 3:00 PM Medical Record Number: [id number] Patient Account Number: [id number] Date of Birth/Sex: Treating RN: 1980-08-09 (40 y.o. [name] Primary Care Elvena Oyer: Marcy Siren Other Clinician: Referring Rodnisha Blomgren: Treating Deejay Koppelman/Extender: Otilio Connors in Treatment: 3 Compression Therapy Performed for Wound Assessment: Wound #33 Right,Lateral Lower Leg Performed By: Clinician Shawn Stall, RN Compression Type: Four Layer Electronic Signature(s) Signed: 10/10/2020 5:11:13 PM By: Shawn Stall Entered By: Shawn Stall on 10/10/2020 16:57:01 -------------------------------------------------------------------------------- Encounter Discharge Information Details Patient Name: Date of Service: [name], Shawn Oppenheim T. 10/10/2020 3:00 PM Medical Record Number: [id number] Patient Account Number: [id number] Date of Birth/Sex: Treating RN: [birth year] (40 y.o. [name] Primary Care Kareena Arrambide: Marcy Siren Other Clinician: Referring Ziare Cryder: Treating Jatasia Gundrum/Extender: Otilio Connors in Treatment: 3 Encounter Discharge Information Items Discharge Condition: Stable Ambulatory Status: Ambulatory Discharge Destination: Home Transportation: Private Auto Accompanied By: girlfriend Schedule Follow-up Appointment: Yes Clinical Summary of Care: Electronic Signature(s) Signed: 10/10/2020 5:11:13 PM By: Shawn Stall Entered By: Shawn Stall on 10/10/2020  16:58:14 -------------------------------------------------------------------------------- Patient/Caregiver Education Details Patient Name: Date of Service: [name] 8/2/2022andnbsp3:00 PM Medical Record Number: [id number] Patient Account Number: [id number] Date of Birth/Gender: Treating RN: 02-05-1981 (40 y.o. [name] Primary Care Physician: Marcy Siren Other Clinician: Referring Physician: Treating Physician/Extender: Otilio Connors in Treatment: 3 Education Assessment Education Provided To: Patient Education Topics Provided Wound/Skin Impairment: Handouts: Skin Care Do's and Dont's Methods: Explain/Verbal Responses: Reinforcements needed Electronic Signature(s) Signed: 10/10/2020 5:11:13 PM By: Shawn Stall Entered By: Shawn Stall on 10/10/2020 16:57:45 -------------------------------------------------------------------------------- Wound Assessment Details Patient Name: Date of Service: [name], [name] T. 10/10/2020 3:00 PM Medical Record Number:  [id number] Patient Account Number: [id number] Date of Birth/Sex: Treating RN: Jul 18, 1980 (40 y.o. Harlon Flor, Millard.Loa Primary Care Niam Nepomuceno: Marcy Siren Other Clinician: Referring Rawlin Reaume: Treating Natally Ribera/Extender: Otilio Connors in Treatment: 3 Wound Status Wound Number: 32 Primary Etiology: Lymphedema Wound Location: Right, Medial Lower Leg Wound Status: Open Wounding Event: Gradually Appeared Date Acquired: 07/11/2020 Weeks Of Treatment: 3 Clustered Wound: No Wound Measurements Length: (cm) 5.8 Width: (cm) 23 Depth: (cm) 0.5 Area: (cm) 104.772 Volume: (cm) 52.386 % Reduction in Area: 3% % Reduction in Volume: -21.3% Wound Description Classification: Full Thickness Without Exposed Support Struct Exudate Amount: Large Exudate Type: Serosanguineous Exudate Color: red, brown ures Treatment Notes Wound #32 (Lower Leg) Wound  Laterality: Right, Medial Cleanser Soap and Water Discharge Instruction: May shower and wash wound with dial antibacterial soap and water prior to dressing change. Wound Cleanser Discharge Instruction: Cleanse the wound with wound cleanser prior to applying Shawn clean dressing using gauze sponges, not tissue or cotton balls. Peri-Wound Care Triamcinolone 15 (g) Discharge Instruction: Use triamcinolone 15 (g) as directed Sween Lotion (Moisturizing lotion) Discharge Instruction: Apply moisturizing lotion as directed Topical Primary Dressing KerraCel Ag Gelling Fiber Dressing, 4x5 in (silver alginate) Discharge Instruction: Apply silver alginate to wound bed as instructed Secondary Dressing Woven Gauze Sponge, Non-Sterile 4x4 in Discharge Instruction: Apply over primary dressing as directed. ABD Pad, 5x9 Discharge Instruction: Apply over primary dressing as directed. Zetuvit Plus 4x8 in Discharge Instruction: Apply over primary dressing as directed. Secured With Compression Wrap FourPress (4 layer compression wrap) Discharge Instruction: Apply four layer compression as directed. May also use Miliken CoFlex 2 layer compression system as alternative. Compression Stockings Add-Ons Electronic Signature(s) Signed: 10/10/2020 5:11:13 PM By: Shawn Stall Entered By: Shawn Stall on 10/10/2020 16:56:40 -------------------------------------------------------------------------------- Wound Assessment Details Patient Name: Date of Service: [name], Shawn Oppenheim T. 10/10/2020 3:00 PM Medical Record Number: [id number] Patient Account Number: [id number] Date of Birth/Sex: Treating RN: Jul 13, 1980 (40 y.o. Harlon Flor, Millard.Loa Primary Care Diva Lemberger: Marcy Siren Other Clinician: Referring Kendy Haston: Treating Aisha Greenberger/Extender: Otilio Connors in Treatment: 3 Wound Status Wound Number: 33 Primary Etiology: Lymphedema Wound Location: Right, Lateral Lower Leg Wound Status:  Open Wounding Event: Gradually Appeared Date Acquired: 07/10/2020 Weeks Of Treatment: 3 Clustered Wound: Yes Wound Measurements Length: (cm) 0.3 Width: (cm) 0.2 Depth: (cm) 0.1 Area: (cm) 0.047 Volume: (cm) 0.005 % Reduction in Area: 99% % Reduction in Volume: 99% Wound Description Classification: Full Thickness Without Exposed Support Struct Exudate Amount: Small Exudate Type: Serosanguineous Exudate Color: red, brown ures Treatment Notes Wound #33 (Lower Leg) Wound Laterality: Right, Lateral Cleanser Soap and Water Discharge Instruction: May shower and wash wound with dial antibacterial soap and water prior to dressing change. Wound Cleanser Discharge Instruction: Cleanse the wound with wound cleanser prior to applying Shawn clean dressing using gauze sponges, not tissue or cotton balls. Peri-Wound Care Triamcinolone 15 (g) Discharge Instruction: Use triamcinolone 15 (g) as directed Sween Lotion (Moisturizing lotion) Discharge Instruction: Apply moisturizing lotion as directed Topical Gentamicin Discharge Instruction: As directed by physician Primary Dressing KerraCel Ag Gelling Fiber Dressing, 4x5 in (silver alginate) Discharge Instruction: Apply silver alginate to wound bed as instructed Secondary Dressing Woven Gauze Sponge, Non-Sterile 4x4 in Discharge Instruction: Apply over primary dressing as directed. ABD Pad, 5x9 Discharge Instruction: Apply over primary dressing as directed. Zetuvit Plus 4x8 in Discharge Instruction: Apply over primary dressing as directed. Secured With Compression Wrap FourPress (4 layer compression wrap) Discharge Instruction: Apply four layer compression as directed.  May also use Miliken CoFlex 2 layer compression system as alternative. Compression Stockings Add-Ons Electronic Signature(s) Signed: 10/10/2020 5:11:13 PM By: Shawn Stall Entered By: Shawn Stall on 10/10/2020  16:56:40 -------------------------------------------------------------------------------- Vitals Details Patient Name: Date of Service: [name], [name] T. 10/10/2020 3:00 PM Medical Record Number: [id number] Patient Account Number: [id number] Date of Birth/Sex: Treating RN: [birth year] (40 y.o. Harlon Flor, Millard.Loa Primary Care Analyce Tavares: Marcy Siren Other Clinician: Referring Lorma Heater: Treating Raidyn Wassink/Extender: Otilio Connors in Treatment: 3 Vital Signs Time Taken: 15:55 Temperature (F): 97.7 Height (in): 74 Pulse (bpm): 89 Weight (lbs): 425 Respiratory Rate (breaths/min): 22 Body Mass Index (BMI): 54.6 Blood Pressure (mmHg): 150/97 Reference Range: 80 - 120 mg / dl Electronic Signature(s) Signed: 10/10/2020 5:11:13 PM By: Shawn Stall Entered By: Shawn Stall on 10/10/2020 16:56:09

## 2020-10-10 NOTE — Progress Notes (Signed)
Virtual Visit via Telephone Note  I connected with [name], on 10/10/2020 at 6:46 PM by telephone due to the COVID-19 pandemic and verified that I am speaking with the correct person using two identifiers.  Due to current restrictions/limitations of in-office visits due to the COVID-19 pandemic, this scheduled clinical appointment was converted to a telehealth visit.   Consent: I discussed the limitations, risks, security and privacy concerns of performing an evaluation and management service by telephone and the availability of in person appointments. I also discussed with the patient that there may be a patient responsible charge related to this service. The patient expressed understanding and agreed to proceed.   Location of Patient: Home  Location of Provider: [facility]   Persons participating in Telemedicine visit: DELMER KOWALSKI Ricky Stabs, NP Margorie John, CMA   History of Present Illness: Subjective: [name]  is a 40 y.o. male who presents for CPAP Supplies.    His concerns today include:  CPAP: Visit 10/06/2020 at [facility] [address] [facility] per NP note: OSA - CPAP compliance encouraged. Reports needing new CPAP supplies. He has upcoming appointment at primary care office to discuss. The CPAP is >50 years old. He is not certain who originally prescribed it. Will route note to primary care to see if they would like him to see Dr. Mayford Knife if needed.   10/11/2020: Patient called previous provider at [facility] to have medical records sent to our office. Requesting updated sleep study for new CPAP machine/equipment.   2. LEG COMPRESSIONS: Requesting leg compressions to help with right leg wounds.  3. SHOULDER NUMBNESS: Began 1 week ago. Left shoulder and radiates to left hand. Comes and goes. Happens randomly making difficult to pick up objects. Tried Tylenol and doesn't help. Denies shortness of breath and chest pain.      Past Medical History:  Diagnosis Date   Chronic venous insufficiency    a. as a premature infant, required venous access for care with subsequent DVT and some sort of procedure -> eventually went on to have vein bypass around 2005.   Hypertension    Lymphedema    Morbid obesity (HCC)    OSA on CPAP    Allergies  Allergen Reactions   Lidocaine Other (See Comments)    "burns my skin"   Peanut-Containing Drug Products Swelling    Tree nuts    Current Outpatient Medications on File Prior to Visit  Medication Sig Dispense Refill   Accu-Chek Softclix Lancets lancets Use as instructed 100 each 12   baclofen (LIORESAL) 10 MG tablet Take 1 tablet (10 mg total) by mouth 3 (three) times daily. 30 each 0   carvedilol (COREG) 12.5 MG tablet Take 1 tablet (12.5 mg total) by mouth 2 (two) times daily. 60 tablet 2   cephALEXin (KEFLEX) 500 MG capsule Take 500 mg by mouth every 6 (six) hours.     doxycycline (VIBRAMYCIN) 100 MG capsule Take 1 capsule (100 mg total) by mouth 2 (two) times daily. 20 capsule 0   furosemide (LASIX) 80 MG tablet Take 1 tablet (80 mg total) by mouth 2 (two) times daily. 60 tablet 2   glucose blood (ACCU-CHEK AVIVA PLUS) test strip Use as instructed 100 each 12   lisinopril (ZESTRIL) 40 MG tablet Take 1 tablet (40 mg total) by mouth daily. 90 tablet 0   meloxicam (MOBIC) 15 MG tablet Take 1 tablet (15 mg total) by mouth daily. 30 tablet 0  metFORMIN (GLUCOPHAGE) 500 MG tablet Take 1 tablet (500 mg total) by mouth daily with breakfast. 90 tablet 0   metolazone (ZAROXOLYN) 2.5 MG tablet Take one tablet 30 minutes prior to your morning Furosemide (Lasix) on Saturdays and Tuesday or as directed by cardiology 30 tablet 0   potassium chloride SA (KLOR-CON) 20 MEQ tablet Take 2 tablets (40 mEq total) by mouth 2 (two) times daily. (Patient not taking: Reported on 10/06/2020) 360 tablet 0   spironolactone (ALDACTONE) 25 MG tablet Take 1 tablet (25 mg total) by mouth daily. 90  tablet 0   traMADol (ULTRAM) 50 MG tablet Take 50 mg by mouth every 6 (six) hours as needed for moderate pain.     No current facility-administered medications on file prior to visit.    Observations/Objective: Alert and oriented x 3. Not in acute distress. Physical examination not completed as this is a telemedicine visit.  Assessment and Plan: 1. OSA on CPAP: - Patient due for updated sleep study and new CPAP machine and equipment.  - Referral to Sleep Study for further evaluation and management.  - Follow-up with primary provider as scheduled.  - [facility] Sleep Study; Future  2. Wound infection: - Per patient request sequential compression devices to assist with right leg wound healing.  - Keep appointments with Wound Care.  - Follow-up with primary provider as scheduled.  - Misc. Devices (SCD SOFT SLEEVES/KNEE LENGTH) MISC; 2 each by Does not apply route as needed.  Dispense: 2 each; Refill: 0  3. Left upper extremity numbness: - Begin Gabapentin as described. May cause drowsiness. Counseled patient to not consume if operating heavy machinery or driving. Counseled patient to not consume with alcohol or illicit substances. Patient verbalized understanding.  - Patient denies red flag symptoms. Counseled should red flag symptoms occur to report to the Emergency Department. Patient verbalized understanding.  - Follow-up with primary provider in 4 weeks or sooner if needed.  - gabapentin (NEURONTIN) 300 MG capsule; Take 1 capsule (300 mg total) by mouth at bedtime as needed.  Dispense: 30 capsule; Refill: 0   Follow Up Instructions: Follow-up with primary provider in 4 weeks or sooner if needed.    Patient was given clear instructions to go to Emergency Department or return to medical center if symptoms don't improve, worsen, or new problems develop.The patient verbalized understanding.  I discussed the assessment and treatment plan with the patient. The patient was provided an  opportunity to ask questions and all were answered. The patient agreed with the plan and demonstrated an understanding of the instructions.   The patient was advised to call back or seek an in-person evaluation if the symptoms worsen or if the condition fails to improve as anticipated.    I provided 10 minutes total of non-face-to-face time during this encounter.   Rema Fendt, NP  [facility] at [facility] [place], [place] [phone] 10/10/2020, 6:46 PM

## 2020-10-10 NOTE — Telephone Encounter (Signed)
.  [name], brands are scheduled for a virtual visit with your provider today.    Just as we do with appointments in the office, we must obtain your consent to participate.  Your consent will be active for this visit and any virtual visit you may have with one of our providers in the next 365 days.    If you have a MyChart account, I can also send a copy of this consent to you electronically.  All virtual visits are billed to your insurance company just like a traditional visit in the office.  As this is a virtual visit, video technology does not allow for your provider to perform a traditional examination.  This may limit your provider's ability to fully assess your condition.  If your provider identifies any concerns that need to be evaluated in person or the need to arrange testing such as labs, EKG, etc, we will make arrangements to do so.    Although advances in technology are sophisticated, we cannot ensure that it will always work on either your end or our end.  If the connection with a video visit is poor, we may have to switch to a telephone visit.  With either a video or telephone visit, we are not always able to ensure that we have a secure connection.   I need to obtain your verbal consent now.   Are you willing to proceed with your visit today?   BRYDEN DARDEN has provided verbal consent on 10/10/2020 for a virtual visit (video or telephone).   Katharine Look 10/10/2020  12:10 PM

## 2020-10-11 ENCOUNTER — Encounter: Payer: Self-pay | Admitting: Family

## 2020-10-11 ENCOUNTER — Telehealth (INDEPENDENT_AMBULATORY_CARE_PROVIDER_SITE_OTHER): Payer: 59 | Admitting: Family

## 2020-10-11 DIAGNOSIS — T148XXA Other injury of unspecified body region, initial encounter: Secondary | ICD-10-CM

## 2020-10-11 DIAGNOSIS — L089 Local infection of the skin and subcutaneous tissue, unspecified: Secondary | ICD-10-CM

## 2020-10-11 DIAGNOSIS — Z9989 Dependence on other enabling machines and devices: Secondary | ICD-10-CM

## 2020-10-11 DIAGNOSIS — G4733 Obstructive sleep apnea (adult) (pediatric): Secondary | ICD-10-CM | POA: Diagnosis not present

## 2020-10-11 DIAGNOSIS — R200 Anesthesia of skin: Secondary | ICD-10-CM

## 2020-10-11 MED ORDER — GABAPENTIN 300 MG PO CAPS: 300.0000 mg | ORAL_CAPSULE | Freq: Every evening | ORAL | 0 refills | Status: DC | PRN

## 2020-10-11 MED ORDER — SCD SOFT SLEEVES/KNEE LENGTH MISC: 2.0000 | 0 refills | Status: AC | PRN

## 2020-10-11 NOTE — Progress Notes (Signed)
Pt presents for telemedicine visit request referral for sleep study needs new Cpap machine  States he needs new leg pumps  For approx 1 week pt experiencing left arm weakness

## 2020-10-17 ENCOUNTER — Other Ambulatory Visit: Payer: Self-pay

## 2020-10-17 ENCOUNTER — Encounter (HOSPITAL_BASED_OUTPATIENT_CLINIC_OR_DEPARTMENT_OTHER): Payer: 59 | Admitting: Physician Assistant

## 2020-10-17 NOTE — Progress Notes (Signed)
Villalpando, Jatinder T ([id number]) . Visit Report for 10/17/2020 Arrival Information Details Patient Name: Date of Service: [name] 10/17/2020 12:30 PM Medical Record Number: [id number] Patient Account Number: [id number] Date of Birth/Sex: Treating RN: 1980/12/12 (40 y.o. Bayard Hugger, Bonita Quin Primary Care Angy Swearengin: Marcy Siren Other Clinician: Referring Nancyann Cotterman: Treating Skylynne Schlechter/Extender: Vicente Males in Treatment: 4 Visit Information History Since Last Visit Added or deleted any medications: No Patient Arrived: Ambulatory Any new allergies or adverse reactions: No Arrival Time: 12:52 Had a fall or experienced change in No Accompanied By: self activities of daily living that may affect Transfer Assistance: None risk of falls: Patient Identification Verified: Yes Signs or symptoms of abuse/neglect since last visito No Secondary Verification Process Completed: Yes Hospitalized since last visit: No Patient Requires Transmission-Based Precautions: No Implantable device outside of the clinic excluding No Patient Has Alerts: No cellular tissue based products placed in the center since last visit: Has Dressing in Place as Prescribed: Yes Has Compression in Place as Prescribed: Yes Pain Present Now: Yes Electronic Signature(s) Signed: 10/17/2020 5:39:27 PM By: Zenaida Deed RN, BSN Entered By: Zenaida Deed on 10/17/2020 12:53:40 -------------------------------------------------------------------------------- Encounter Discharge Information Details Patient Name: Date of Service: [name], [name] T. 10/17/2020 12:30 PM Medical Record Number: [id number] Patient Account Number: [id number] Date of Birth/Sex: Treating RN: 1981/01/30 (40 y.o. Lytle Michaels Primary Care Vivia Rosenburg: Marcy Siren Other Clinician: Referring Kassondra Geil: Treating Cloris Flippo/Extender: Vicente Males in Treatment: 4 Encounter Discharge  Information Items Discharge Condition: Stable Ambulatory Status: Ambulatory Discharge Destination: Home Transportation: Private Auto Schedule Follow-up Appointment: Yes Clinical Summary of Care: Provided on 10/17/2020 Form Type Recipient Paper Patient Patient Electronic Signature(s) Signed: 10/17/2020 1:35:44 PM By: Antonieta Iba Entered By: Antonieta Iba on 10/17/2020 13:35:44 -------------------------------------------------------------------------------- Lower Extremity Assessment Details Patient Name: Date of Service: [name], [name] T. 10/17/2020 12:30 PM Medical Record Number: [id number] Patient Account Number: [id number] Date of Birth/Sex: Treating RN: 01-Sep-1980 (40 y.o. [name] Primary Care Ronetta Molla: Marcy Siren Other Clinician: Referring Kayo Zion: Treating Jonasia Coiner/Extender: Vicente Males in Treatment: 4 Edema Assessment Assessed: [Left: No] [Right: No] Edema: [Left: Ye] [Right: s] Calf Left: Right: Point of Measurement: 36 cm From Medial Instep 55 cm Ankle Left: Right: Point of Measurement: 10 cm From Medial Instep 34 cm Vascular Assessment Pulses: Dorsalis Pedis Palpable: [Right:Yes] Electronic Signature(s) Signed: 10/17/2020 5:39:27 PM By: Zenaida Deed RN, BSN Entered By: Zenaida Deed on 10/17/2020 12:58:04 -------------------------------------------------------------------------------- Multi-Disciplinary Care Plan Details Patient Name: Date of Service: [name], [name] T. 10/17/2020 12:30 PM Medical Record Number: [id number] Patient Account Number: [id number] Date of Birth/Sex: Treating RN: Feb 02, 1981 (40 y.o. [name], [name] Primary Care Donnie Panik: Marcy Siren Other Clinician: Referring Maygen Sirico: Treating Alyra Patty/Extender: Vicente Males in Treatment: 4 Active Inactive Wound/Skin Impairment Nursing Diagnoses: Impaired tissue integrity Knowledge deficit related to  ulceration/compromised skin integrity Goals: Patient will have a decrease in wound volume by X% from date: (specify in notes) Date Initiated: 09/19/2020 Target Resolution Date: 11/11/2020 Goal Status: Active Patient/caregiver will verbalize understanding of skin care regimen Date Initiated: 09/19/2020 Target Resolution Date: 12/12/2020 Goal Status: Active Ulcer/skin breakdown will have a volume reduction of 30% by week 4 Date Initiated: 09/19/2020 Target Resolution Date: 11/11/2020 Goal Status: Active Ulcer/skin breakdown will have a volume reduction of 50% by week 8 Date Initiated: 09/19/2020 Target Resolution Date: 11/11/2020 Goal Status: Active Interventions: Assess patient/caregiver ability to obtain necessary  supplies Assess patient/caregiver ability to perform ulcer/skin care regimen upon admission and as needed Assess ulceration(s) every visit Notes: Electronic Signature(s) Signed: 10/17/2020 5:35:30 PM By: Fonnie MuBreedlove, [name] RN Entered By: Fonnie MuBreedlove, [name] on 10/17/2020 08:49:05 -------------------------------------------------------------------------------- Pain Assessment Details Patient Name: Date of Service: [name], Shawn OppenheimA DRIA N T. 10/17/2020 12:30 PM Medical Record Number: [id number] Patient Account Number: [id number] Date of Birth/Sex: Treating RN: Jul 23, 1980 (40 y.o. Damaris SchoonerM) Boehlein, Linda Primary Care Zain Bingman: Marcy SirenWallace, Catherine Other Clinician: Referring Itzamar Traynor: Treating Tsuyako Jolley/Extender: Vicente MalesStone III, Hoyt Wallace, Catherine Weeks in Treatment: 4 Active Problems Location of Pain Severity and Description of Pain Patient Has Paino Yes Site Locations Pain Location: Pain in Ulcers With Dressing Change: Yes Duration of the Pain. Constant / Intermittento Constant Rate the pain. Current Pain Level: 6 Worst Pain Level: 8 Least Pain Level: 4 Character of Pain Describe the Pain: Stabbing, Throbbing Pain Management and Medication Current Pain Management: Medication: Yes Is  the Current Pain Management Adequate: Adequate How does your wound impact your activities of daily livingo Sleep: Yes Bathing: No Appetite: No Relationship With Others: No Bladder Continence: No Emotions: No Bowel Continence: No Work: No Toileting: No Drive: No Dressing: No Hobbies: No Electronic Signature(s) Signed: 10/17/2020 5:39:27 PM By: Zenaida DeedBoehlein, Linda RN, BSN Entered By: Zenaida DeedBoehlein, Linda on 10/17/2020 12:57:49 -------------------------------------------------------------------------------- Patient/Caregiver Education Details Patient Name: Date of Service: Shawn CraneLO [name], [name] 8/9/2022andnbsp12:30 PM Medical Record Number: [id number] Patient Account Number: [id number] Date of Birth/Gender: Treating RN: Jul 23, 1980 (40 y.o. Lucious GrovesM) Breedlove, [name] Primary Care Physician: Marcy SirenWallace, Catherine Other Clinician: Referring Physician: Treating Physician/Extender: Vicente MalesStone III, Hoyt Wallace, Catherine Weeks in Treatment: 4 Education Assessment Education Provided To: Patient Education Topics Provided Wound/Skin Impairment: Methods: Explain/Verbal Responses: State content correctly Electronic Signature(s) Signed: 10/17/2020 5:35:30 PM By: Fonnie MuBreedlove, [name] RN Entered By: Fonnie MuBreedlove, [name] on 10/17/2020 08:49:19 -------------------------------------------------------------------------------- Wound Assessment Details Patient Name: Date of Service: [name], Shawn OppenheimA DRIA N T. 10/17/2020 12:30 PM Medical Record Number: [id number] Patient Account Number: [id number] Date of Birth/Sex: Treating RN: Jul 23, 1980 (40 y.o. Damaris SchoonerM) Boehlein, Linda Primary Care Cheyenna Pankowski: Marcy SirenWallace, Catherine Other Clinician: Referring Ylonda Storr: Treating Zaylyn Bergdoll/Extender: Vicente MalesStone III, Hoyt Wallace, Catherine Weeks in Treatment: 4 Wound Status Wound Number: 32 Primary Lymphedema Etiology: Wound Location: Right, Medial Lower Leg Wound Open Wounding Event: Gradually Appeared Status: Date Acquired: 07/11/2020 Comorbid Lymphedema,  Deep Vein Thrombosis, Hypertension, Peripheral Weeks Of Treatment: 4 History: Venous Disease, Type II Diabetes Clustered Wound: No Photos Wound Measurements Length: (cm) 6.5 Width: (cm) 22 Depth: (cm) 0.3 Area: (cm) 112.312 Volume: (cm) 33.694 % Reduction in Area: -4% % Reduction in Volume: 22% Epithelialization: Small (1-33%) Tunneling: No Undermining: No Wound Description Classification: Full Thickness Without Exposed Support Structures Wound Margin: Distinct, outline attached Exudate Amount: Large Exudate Type: Purulent Exudate Color: yellow, brown, green Foul Odor After Cleansing: Yes Due to Product Use: No Slough/Fibrino Yes Wound Bed Granulation Amount: Small (1-33%) Exposed Structure Granulation Quality: Red Fascia Exposed: No Necrotic Amount: Large (67-100%) Fat Layer (Subcutaneous Tissue) Exposed: Yes Necrotic Quality: Adherent Slough Tendon Exposed: No Muscle Exposed: No Joint Exposed: No Bone Exposed: No Treatment Notes Wound #32 (Lower Leg) Wound Laterality: Right, Medial Cleanser Soap and Water Discharge Instruction: May shower and wash wound with dial antibacterial soap and water prior to dressing change. Wound Cleanser Discharge Instruction: Cleanse the wound with wound cleanser prior to applying a clean dressing using gauze sponges, not tissue or cotton balls. Peri-Wound Care Triamcinolone 15 (g) Discharge Instruction: Use triamcinolone 15 (g) as directed Sween Lotion (Moisturizing lotion) Discharge Instruction:  Apply moisturizing lotion as directed Topical Primary Dressing KerraCel Ag Gelling Fiber Dressing, 4x5 in (silver alginate) Discharge Instruction: Apply silver alginate to wound bed as instructed Secondary Dressing Woven Gauze Sponge, Non-Sterile 4x4 in Discharge Instruction: Apply over primary dressing as directed. ABD Pad, 5x9 Discharge Instruction: Apply over primary dressing as directed. Zetuvit Plus 4x8 in Discharge  Instruction: Apply over primary dressing as directed. Secured With Compression Wrap FourPress (4 layer compression wrap) Discharge Instruction: Apply four layer compression as directed. May also use Miliken CoFlex 2 layer compression system as alternative. Compression Stockings Add-Ons Electronic Signature(s) Signed: 10/17/2020 5:39:27 PM By: Zenaida Deed RN, BSN Signed: 10/17/2020 5:41:18 PM By: Shawn Stall Entered By: Shawn Stall on 10/17/2020 12:57:12 -------------------------------------------------------------------------------- Wound Assessment Details Patient Name: Date of Service: [name], [name] T. 10/17/2020 12:30 PM Medical Record Number: [id number] Patient Account Number: [id number] Date of Birth/Sex: Treating RN: 1980/05/24 (40 y.o. [name] Primary Care Everhett Bozard: Marcy Siren Other Clinician: Referring Drucella Karbowski: Treating Jamoni Broadfoot/Extender: Vicente Males in Treatment: 4 Wound Status Wound Number: 33 Primary Lymphedema Etiology: Wound Location: Right, Lateral Lower Leg Wound Open Wounding Event: Gradually Appeared Status: Date Acquired: 07/10/2020 Comorbid Lymphedema, Deep Vein Thrombosis, Hypertension, Peripheral Weeks Of Treatment: 4 History: Venous Disease, Type II Diabetes Clustered Wound: Yes Photos Wound Measurements Length: (cm) 0.1 Width: (cm) 0.1 Depth: (cm) 0.1 Area: (cm) 0.008 Volume: (cm) 0.001 % Reduction in Area: 99.8% % Reduction in Volume: 99.8% Epithelialization: Large (67-100%) Tunneling: No Undermining: No Wound Description Classification: Full Thickness Without Exposed Support Structures Wound Margin: Distinct, outline attached Exudate Amount: Small Exudate Type: Serosanguineous Exudate Color: red, brown Foul Odor After Cleansing: No Slough/Fibrino No Wound Bed Granulation Amount: Large (67-100%) Exposed Structure Granulation Quality: Pink Fascia Exposed: No Necrotic Amount: None  Present (0%) Fat Layer (Subcutaneous Tissue) Exposed: Yes Tendon Exposed: No Muscle Exposed: No Joint Exposed: No Bone Exposed: No Treatment Notes Wound #33 (Lower Leg) Wound Laterality: Right, Lateral Cleanser Soap and Water Discharge Instruction: May shower and wash wound with dial antibacterial soap and water prior to dressing change. Wound Cleanser Discharge Instruction: Cleanse the wound with wound cleanser prior to applying a clean dressing using gauze sponges, not tissue or cotton balls. Peri-Wound Care Triamcinolone 15 (g) Discharge Instruction: Use triamcinolone 15 (g) as directed Sween Lotion (Moisturizing lotion) Discharge Instruction: Apply moisturizing lotion as directed Topical Gentamicin Discharge Instruction: As directed by physician Primary Dressing KerraCel Ag Gelling Fiber Dressing, 4x5 in (silver alginate) Discharge Instruction: Apply silver alginate to wound bed as instructed Secondary Dressing Woven Gauze Sponge, Non-Sterile 4x4 in Discharge Instruction: Apply over primary dressing as directed. ABD Pad, 5x9 Discharge Instruction: Apply over primary dressing as directed. Zetuvit Plus 4x8 in Discharge Instruction: Apply over primary dressing as directed. Secured With Compression Wrap FourPress (4 layer compression wrap) Discharge Instruction: Apply four layer compression as directed. May also use Miliken CoFlex 2 layer compression system as alternative. Compression Stockings Add-Ons Electronic Signature(s) Signed: 10/17/2020 5:39:27 PM By: Zenaida Deed RN, BSN Signed: 10/17/2020 5:41:18 PM By: Shawn Stall Entered By: Shawn Stall on 10/17/2020 12:57:53 -------------------------------------------------------------------------------- Vitals Details Patient Name: Date of Service: [name], [name] 10/17/2020 12:30 PM Medical Record Number: [id number] Patient Account Number: [id number] Date of Birth/Sex: Treating RN: 06-30-1980 (40 y.o. [name] Primary Care Jalyah Weinheimer: Marcy Siren Other Clinician: Referring Afrah Burlison: Treating Munirah Doerner/Extender: Vicente Males in Treatment: 4 Vital Signs Time Taken: 12:56 Temperature (F): 98.8 Height (in): 74 Pulse (  bpm): 90 Source: Stated Respiratory Rate (breaths/min): 20 Weight (lbs): 425 Blood Pressure (mmHg): 119/80 Source: Stated Reference Range: 80 - 120 mg / dl Body Mass Index (BMI): 54.6 Electronic Signature(s) Signed: 10/17/2020 5:39:27 PM By: Zenaida Deed RN, BSN Entered By: Zenaida Deed on 10/17/2020 12:57:00

## 2020-10-17 NOTE — Progress Notes (Addendum)
[name], [name] ([id number]) . Visit Report for 10/17/2020 Chief Complaint Document Details Patient Name: Date of Service: [name] 10/17/2020 12:30 PM Medical Record Number: [id number] Patient Account Number: [id number] Date of Birth/Sex: Treating RN: October 15, 1980 (40 y.o. Male) Fonnie Mu Primary Care Provider: Marcy Siren Other Clinician: Referring Provider: Treating Provider/Extender: Vicente Males in Treatment: 4 Information Obtained from: Patient Chief Complaint pt has blockage or absence of r iliac vein 05/22/2018; patient is here for review of wound on his right lower extremity 09/19/2020; patient is again here for wound review of wounds on his right lower leg Electronic Signature(s) Signed: 10/17/2020 12:59:27 PM By: Lenda Kelp PA-C Entered By: Lenda Kelp on 10/17/2020 12:59:27 -------------------------------------------------------------------------------- HPI Details Patient Name: Date of Service: [name], [name]. 10/17/2020 12:30 PM Medical Record Number: [id number] Patient Account Number: [id number] Date of Birth/Sex: Treating RN: [birth year] (40 y.o. Male) Fonnie Mu Primary Care Provider: Marcy Siren Other Clinician: Referring Provider: Treating Provider/Extender: Vicente Males in Treatment: 4 History of Present Illness HPI Description: The patient is Shawn 40 yrs old bm here for evaluation of his right leg ulcer. He has an extensive history of lymphedema and ulcers. He is being treated at the [facility] by Dr. [name] with [name] boots and the [facility]. He has pumps at home but he is only using them once Shawn day. 08/30/14 selective debridement done of surface eschar. The wound cleans up quite nicely in the bed of this looks healthy. He is using Shawn wound VAC under an Unna wrap. 11/17/14; selective debridement done of surface eschar. Once again the wound beds at clean up quite nicely. He is no longer using  Shawn wound VAC. Recent dressing changes include Hydrofera Blue. Apparently he has done Shawn wide variety of different topical dressings including advanced treatment options like Apligraf's without success. 03/21/15; surgical debridement done of surface eschar and nonviable subcutaneous tissue. The area on the medial aspect of the right leg has closed and the wound on the lateral right leg looks improved has been using Silver Collagen 04/11/15; I think attendance here is sporadic. The area on the right medial leg remains healed. He has Shawn new tiny area on the back of the right leg. Again Shawn surgical debridement of the surface eschar and nonviable subcutaneous tissue of the major wound on the right lateral leg. He has been using Silver Collagen and at home, he does his own Unna wraps at home edema control seems reasonable. 04/20/15; the area on the right medial leg has Shawn very superficial area which may not even be open nevertheless I felt needed to be dressed today [this is his original chronic wound] the new wound is on the right anterior lateral leg is underwent Shawn surgical debridement. He has Shawn small wound on the right posterior leg. He puts on his own Unna boots, according to our nurses he does this fairly well. We have been using Silver collagen. 05/02/2015 -- I understand in the past his venous studies have been done at [facility] and he was advised weight loss before they would attempt to tackle his iliac vein blockage. He has not gone back for review. 06/27/2015 -- we have applied for Apligraf and are awaiting his insurance clearance. 07/25/2015 -- he still has to use her back from his insurance agent regarding his copayment for his Apligraf. 07/31/2015 -- the patient got Shawn reply from the insurance agent regarding the dollar  payment for his application but is not sure whether it is for 5 or for one. 11/28/2015 -- has been hurting Shawn little bit more and he has had change in color of his wound especially on  the medial part. 12/05/2015 -- his culture was positive for Proteus mirabilis and K Oxytoca which are sensitive to ciprofloxacin which he is already on. 10/3/17still on ciprofloxacin. His mother reports of dressing had to be changed last Friday due to odor. He has not been systemically unwell 12/19/15; patient came in today complaining of increasing pain and tightness in his upper right thigh. He completed the ciprofloxacin 2 or 3 days ago. He is not running Shawn fever today. 12/26/2015 -- last week he had been seen by Dr. [name] who got Shawn lower extremity venous duplex evaluation done which did not show DVT or SVT in the right lower extremity. he had also put him on Augmentin in addition to the previous ciprofloxacin and he had received for 2 weeks 01/02/2016 -- -- was admitted to the hospital on 12/26/2015 and discharged on 12/29/2015 and was treated for cellulitis. He was also newly diagnosed with type 2 diabetes mellitus and outpatient monitoring and initiation of treatment was recommended. Patients hemoglobin A1c was 6.6 No osteomyelitis detected on x-rays and recent Doppler study was negative for DVT Oral doxycycline and Levaquin were recommended for the patient as an . outpatient for Shawn 14 day treatment. IV Zosyn and vancomycin was given due to concerns of Pseudomonas infection while he was in hospital. 02/13/2016 -- he has not gone back to [facility] where he had his vascular opinion earlier and I have urged him to regroup with them to see if there is any surgical options available. 02/27/2016 -- has an appointment to see [facility] vascular surgeons on January 3 and may be seeing Dr. Verdie Drown 03/19/2016 -- I was not able to find any notes on Epic but the patient did go to the vascular clinic at [facility] and saw the PA to Dr. Verdie Drown. I understand she has recommended Shawn CT scan and MRI to be done on February 1 and they would review this with him on the same  day. 04/09/2016 -- was admitted to the hospital on 03/20/2016 acute respiratory failure with hypoxia, abdominal discomfort with erythema, hypertensive urgency and chronic wound of the right leg with morbid obesity. he was discharged home on 04/05/2016 and was to continue on IV antibiotics for 18 more days follow-up with the wound center and continue with his cardiologist. he has lost approximately 130 pounds and diuresed over 48 L. He was treated with IV Zosyn and ID recommended he continue this for 3 weeks more. The notes from [facility] wound clinic were noted where he was seen by the PA and she had taken cultures which grew MSSA and Pseudomonas. She had recommended edema control with Shawn 4-layer compression and also referred him to the lymphedema clinic. Shawn CT venogram was also planned for the future. 04/16/2016 -- at [facility] Shawn CT pelvic venogram runoff was done on 04/11/2016 -- IMPRESSION:-Limited study secondary to poor opacification of venous structures. No definite evidence of acute deep vein thrombosis. 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 -Extensive right iliac chain and right inguinal adenopathy, favored to be reactive/secondary to congestion. The patient continues on his IV antibiotics through his PICC line and has his cardiology opinion and also Shawn bariatric  surgery opinion coming up. 04/30/2016 -- He has completed his IV antibiotics through his PICC line. 05/14/2016 --he was seen by the PA, Ms Sluss, recommended alternate day dressing with compression and use Hibiclens and Dakin's solution with acetic acid on his wounds. 07/30/2016 -- he has still not got his custom-made compression stockings and I have urged him to go and get him self measured for these. 08/06/2016 -- he has been measured for his custom stocking and will get in 2 weeks. He has lost 20 pounds since March 08/27/2016 -- he has not  got his custom stockings yet and he tried another pair which has not fit well and he has had Shawn lot of maceration increase the size of his wound. 09/03/2016 -- the patient says that he has not been very compliant with his dressing changes and his elevation and exercise and this week he is notices wound get rather large with maceration. 09/18/16; according to our intake nurse the measurements on this patient's wound are slightly larger. I note previous compliance concerns. I note that his wounds measured larger last week which was noted by Dr. Meyer Russel. Culture done last week was negative. 10/01/2016 -- the patient has received some lymphedema pumps which are new and he says he is being compliant with this. He is going to be away for 2 weeks on vacation to [facility] 10/15/2016 -- he returns after 2 weeks and tells me he has been diligent with his dressing and has lost 25 pounds over the last 3 months. 10/22/2016 -- his last hemoglobin A1c was 6.2 and he is now diet controlled. 11/19/2016 -- he has been having problems with his compression stockings which are custom made at [facility]. He has not yet been able to get them. He had taken out his compression because he had gone there and had no dressing on when he came here today READMISSION 05/22/2018 [name] is Shawn now 40 year old man with Shawn Cicalese history of wounds in his lower extremities secondary to chronic venous insufficiency with secondary lymphedema. He has had previous vein ligations on the right although I do not have this information in front of me. As I understand things he also has central venous obstruction with Shawn vein bypass in 2005 I believe this was done at [facility]. He tells me over the last 2 months he has noted mid reopening in the right mid anterior lower extremity. This is Shawn rectangular shaped wound which is kind of odd in terms of how that formed. He has been using silver alginate and Unna boots that we used on him when he was here in  2018. He buys his product supply online thinks this is cheaper than ordering through intermediary's Past medical history; CHF, morbid obesity, hypertension, hyperlipidemia, chronic venous insufficiency, vein bypass in 2005 previous vein ablations or ligations. He has Shawn stent in the right lower extremity. ABI in this clinic was 1.04. He is using his compression pumps at home 3/27; 2-week follow-up. Right lower extremity wounds related to chronic venous insufficiency and lymphedema. He has been using silver alginate under an Radio broadcast assistant. He change the [employer] himself at home last week. He is making nice progress 4/10; 2-week follow-up. Right lower extremity wound is just about closed Shawn small open area in the middle of the and large rectangular wound he had at the beginning. His edema control is excellent. He says he is using his compression pumps religiously 4/17; 2-week follow-up. Right lower extremity wound is totally closed.  He had Shawn large rectangular wound which is progressively closed. His edema control is very good. He is using his compression pumps once to twice Shawn day READMISSION 09/19/2020 [name] is now Shawn 40 year old man we have had for several stays in this clinic including 2017, 2018 and most recently in 2020 with Shawn wound on his right lower leg. He has compression pumps which he claims to be using twice Shawn day. He also has stockings however I am not sure how much he uses them. Things have broken down over the last month or 2 he has an extensive wound area on the right mid tibial area I think this is similar to what he has had in the past. He has been using Xeroform and an Ace wrap that his girlfriend is applying. Past medical history; the patient has known chronic venous insufficiency with secondary lymphedema. He had an iliac vein blockage and he had Shawn bypass at [facility] although the patient is not followed up with them. The surgeon may have been Dr. Jacolyn Reedy. As noted he has lymphedema pumps.  Type 2 diabetes congestive heart failure obstructive sleep apnea. He has Shawn history of Shawn right leg DVT although this may have been the iliac vein he is not currently on anticoagulation ABI in our clinic is 1.37 on the right Imaging Results - in this encounter CT Pelvic Venogram Run Off Imaging Results - CT Pelvic Venogram Run Off Impressions Performed At -Limited study secondary to poor opacification of venous structures. Nodefinite evidence of acute deep vein thrombosis. 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 -Extensive right iliac chain and right inguinal adenopathy, favored to bereactive/secondary to congestion. [facility] Imaging Results - CT Pelvic Venogram Run Off Narrative Performed At EXAM: CT PELVIC VENOGRAM RUN OFF DATE: 04/11/2016 1:28 PM ACCESSION: [id number] UN DICTATED: 04/11/2016 2:18 PM INTERPRETATION LOCATION: [facility] CLINICAL INDICATION: 40 years old Male with h/o RLE DVT and massive lymphedema of both LE's. Eval for venous outflow obstruction vs extrinsic mass- L97.203-Skin ulcer of calf with necrosis of muscle, unspecified laterality(RAF-HCC) COMPARISON: CT abdomen pelvis dated 02/11/2007 TECHNIQUE: Shawn spiral CT scan was obtained approximately 3 minutes after administration of IV contrast from the kidneys to the knees.Images were reconstructed in the axial plane. Multiplanar reformatted and MIP images were provided for further evaluation of the vessels. For selected cases, 3D volumerendered images are also provided. VASCULAR FINDINGS: There is overall poor contrast opacification of the venous structures, limitingevaluation. IVC: No evidence of thrombus. Iliac veins: The right iliac veins are chronically occluded/diminutive in size. There are extensive venous collaterals noted throughout the right pelvis extending into the right lower extremity. The number and caliber of  the venous collaterals have increased when compared to 02/11/2007. The left iliac veins appear patent. There are also small caliber venous collaterals within the left pelvis extending into the left lower extremity. Anterior abdominal wall venouscollaterals are also noted but fully imaged secondary to patient diameter. The bilateral femoral and popliteal veins appear patent. There is no evidence of acute thrombus. The arterial vasculature is unremarkable on this venous phase time study. NONVASCULAR FINDINGS: LOWER CHEST Unremarkable. : ABDOMEN: HEPATOBILIARY: Unremarkable liver. No biliary ductal dilatation. Gallbladder isremarkable. PANCREAS: Unremarkable. SPLEEN: Unremarkable. ADRENAL GLANDS: Unremarkable. KIDNEYS/URETERS: Unremarkable. BLADDER: Unremarkable. BOWEL/PERITONEUM/RETROPERITONEUM: No bowel obstruction. No acute inflammatoryprocess. No ascites. LYMPH NODES: Extensive right iliac chain and right inguinal adenopathy. Shawn nodal conglomerate in the right inguinal region measures 6.8 x 3.4  cm (2:124). This is only slightly larger when compared to 02/11/2007. Given interval stabilityover 10 years, these are favored to be reactive/secondary to congestion. REPRODUCTIVE: Unremarkable. BONES/SOFT TISSUES: No acute osseous abnormalities. Postsurgical changes in the right inguinal and left medial thigh region. Right greater than left softtissue edema throughout the lower extremities. [facility] Imaging Results - CT Pelvic Venogram Run Off Procedure Note Interface, Rad Results In - 04/11/2016 3:28 PM EST EXAM: CT PELVIC VENOGRAM RUN OFF DATE: 04/11/2016 1:28 PM ACCESSION: [id number] UN DICTATED: 04/11/2016 2:18 PM INTERPRETATION LOCATION: [facility] CLINICAL INDICATION: 40 years old Male with h/o RLE DVT and massive lymphedema of both LE's. Eval for venous outflow obstruction vs extrinsic mass- L97.203-Skin ulcer of calf with necrosis of muscle, unspecified laterality (RAF-HCC) COMPARISON: CT  abdomen pelvis dated 02/11/2007 TECHNIQUE: Shawn spiral CT scan was obtained approximately 3 minutes after administration of IV contrast from the kidneys to the knees. Images were reconstructed in the axial plane. Multiplanar reformatted and MIP images were provided for further evaluation of the vessels. For selected cases, 3D volume rendered images are also provided. VASCULAR FINDINGS: There is overall poor contrast opacification of the venous structures, limiting evaluation. IVC: No evidence of thrombus. Iliac veins: The right iliac veins are chronically occluded/diminutive in size. There are extensive venous collaterals noted throughout the right pelvis extending into the right lower extremity. The number and caliber of the venous collaterals have increased when compared to 02/11/2007. The left iliac veins appear patent. There are also small caliber venous collaterals within the left pelvis extending into the left lower extremity. Anterior abdominal wall venous collaterals are also noted but fully imaged secondary to patient diameter. The bilateral femoral and popliteal veins appear patent. There is no evidence of acute thrombus. The arterial vasculature is unremarkable on this venous phase time study. NONVASCULAR FINDINGS: LOWER CHEST Unremarkable. : ABDOMEN: HEPATOBILIARY: Unremarkable liver. No biliary ductal dilatation. Gallbladder is remarkable. PANCREAS: Unremarkable. SPLEEN: Unremarkable. ADRENAL GLANDS: Unremarkable. KIDNEYS/URETERS: Unremarkable. BLADDER: Unremarkable. BOWEL/PERITONEUM/RETROPERITONEUM: No bowel obstruction. No acute inflammatory process. No ascites. LYMPH NODES: Extensive right iliac chain and right inguinal adenopathy. Shawn nodal conglomerate in the right inguinal region measures 6.8 x 3.4 cm (2:124). This is only slightly larger when compared to 02/11/2007. Given interval stability over 10 years, these are favored to be reactive/secondary to congestion. REPRODUCTIVE:  Unremarkable. BONES/SOFT TISSUES: No acute osseous abnormalities. Postsurgical changes in the right inguinal and left medial thigh region. Right greater than left soft tissue edema throughout the lower extremities. IMPRESSION: -Limited study secondary to poor opacification of venous structures. No definite evidence of acute deep vein thrombosis. 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 -Extensive right iliac chain and right inguinal adenopathy, favored to be reactive/secondary to congestion. Imaging Results - CT Pelvic Venogram Run Off Performing Organization Address City/State/Zipcode Phone Number [facility] [name] [place], [place] [address] Surgical summary as listed below; Assessment: [name] is Shawn 40 y.o. male with Kazmierski history of right lower extremity chronic obstructive deep venous disease with recurrent ulceration, absence of right iliac vein, prior left to right femoral vein bypass. Patient continues with recurrent ulceration, his weight precludes him from further vascular studies at this time. If he is able to lose 50-100 pounds we would be able to do left lower extremity venogram possible intervention with 50% chance of re- opening occlusion per Dr. Jacolyn Reedy. For global health, weight loss, potential improvement in his chronic venous insufficiency  would appreciateinput from bariatric surgery group for recommendations. 09/26/20; patient comes in today with not much improvement. He has also some odor. 3 open areas and this linear area in his mid calf require debridement. We have been using silver alginate under compression. I gave him Keflex last week for what I thought was cellulitis in the right medial thigh he is completing this and I think this is better. 7/26; patient comes in today with again necrotic debris over these wounds. According to our intake nurse extreme malodor and  drainage. We have been using silver alginate under compression. He is complaining about pain in the wound area. 7/29; Patient presents for follow-up. He reports improvement to the wound. He has finished taking Keflex. He has no issues or complaints today. 10/17/2020 upon evaluation today patient appears to be doing poorly in regard to his leg ulcer. He has been tolerating the dressing changes without complication. Fortunately there does not appear to be any signs of active infection at this time. No fevers, chills, nausea, vomiting, or diarrhea. Electronic Signature(s) Signed: 10/17/2020 1:34:18 PM By: Lenda Kelp PA-C Entered By: Lenda Kelp on 10/17/2020 13:34:18 -------------------------------------------------------------------------------- Physical Exam Details Patient Name: Date of Service: [name], Shawn Oppenheim [name]. 10/17/2020 12:30 PM Medical Record Number: [id number] Patient Account Number: [id number] Date of Birth/Sex: Treating RN: 04-15-1980 (40 y.o. Male) Fonnie Mu Primary Care Provider: Marcy Siren Other Clinician: Referring Provider: Treating Provider/Extender: Vicente Males in Treatment: 4 Constitutional Obese and well-hydrated in no acute distress. Respiratory normal breathing without difficulty. Psychiatric this patient is able to make decisions and demonstrates good insight into disease process. Alert and Oriented x 3. pleasant and cooperative. Notes Upon inspection patient appears to still have very discolored tissue which does not appear to be healthy at all with regard to his leg. He has still some pus noted overall I do not feel like the Keflex really did much for him. I am really thinking that he is going require different oral antibiotic. I think Levaquin may be the best way to go. I discussed that with him today based on the PCR culture result I think this may be the best option. Electronic Signature(s) Signed: 10/17/2020 1:35:05  PM By: Lenda Kelp PA-C Entered By: Lenda Kelp on 10/17/2020 13:35:04 -------------------------------------------------------------------------------- Physician Orders Details Patient Name: Date of Service: [name], [name]. 10/17/2020 12:30 PM Medical Record Number: [id number] Patient Account Number: [id number] Date of Birth/Sex: Treating RN: 17-Nov-1980 (40 y.o. Male) Fonnie Mu Primary Care Provider: Marcy Siren Other Clinician: Referring Provider: Treating Provider/Extender: Vicente Males in Treatment: 4 Verbal / Phone Orders: No Diagnosis Coding ICD-10 Coding Code Description I87.331 Chronic venous hypertension (idiopathic) with ulcer and inflammation of right lower extremity I89.0 Lymphedema, not elsewhere classified L97.818 Non-pressure chronic ulcer of other part of right lower leg with other specified severity Follow-up Appointments ppointment in 1 week. - Dr. [name] Return Shawn Bathing/ Shower/ Hygiene May shower with protection but do not get wound dressing(s) wet. - May use Shawn cast wrap to put over wraps to shower; you can buy these at [facility] or [facility] Edema Control - Lymphedema / SCD / Other Lymphedema Pumps. Use Lymphedema pumps on leg(s) 2-3 times Shawn day for 45-60 minutes. If wearing any wraps or hose, do not remove them. Continue exercising as instructed. Elevate legs to the level of the heart or above for 30 minutes daily and/or when sitting, Shawn frequency of: Avoid  standing for Ky periods of time. Patient to wear own compression stockings every day. - on Left Leg Wound Treatment Wound #32 - Lower Leg Wound Laterality: Right, Medial Cleanser: Soap and Water 2 x Per Week/15 Days Discharge Instructions: May shower and wash wound with dial antibacterial soap and water prior to dressing change. Cleanser: Wound Cleanser (Generic) 2 x Per Week/15 Days Discharge Instructions: Cleanse the wound with wound cleanser prior to  applying Shawn clean dressing using gauze sponges, not tissue or cotton balls. Peri-Wound Care: Triamcinolone 15 (g) 2 x Per Week/15 Days Discharge Instructions: Use triamcinolone 15 (g) as directed Peri-Wound Care: Sween Lotion (Moisturizing lotion) 2 x Per Week/15 Days Discharge Instructions: Apply moisturizing lotion as directed Prim Dressing: KerraCel Ag Gelling Fiber Dressing, 4x5 in (silver alginate) (Generic) 2 x Per Week/15 Days ary Discharge Instructions: Apply silver alginate to wound bed as instructed Secondary Dressing: Woven Gauze Sponge, Non-Sterile 4x4 in (Generic) 2 x Per Week/15 Days Discharge Instructions: Apply over primary dressing as directed. Secondary Dressing: ABD Pad, 5x9 (Generic) 2 x Per Week/15 Days Discharge Instructions: Apply over primary dressing as directed. Secondary Dressing: Zetuvit Plus 4x8 in (Generic) 2 x Per Week/15 Days Discharge Instructions: Apply over primary dressing as directed. Compression Wrap: FourPress (4 layer compression wrap) (Generic) 2 x Per Week/15 Days Discharge Instructions: Apply four layer compression as directed. May also use Miliken CoFlex 2 layer compression system as alternative. Wound #33 - Lower Leg Wound Laterality: Right, Lateral Cleanser: Soap and Water 1 x Per Week/15 Days Discharge Instructions: May shower and wash wound with dial antibacterial soap and water prior to dressing change. Cleanser: Wound Cleanser (Generic) 1 x Per Week/15 Days Discharge Instructions: Cleanse the wound with wound cleanser prior to applying Shawn clean dressing using gauze sponges, not tissue or cotton balls. Peri-Wound Care: Triamcinolone 15 (g) 1 x Per Week/15 Days Discharge Instructions: Use triamcinolone 15 (g) as directed Peri-Wound Care: Sween Lotion (Moisturizing lotion) 1 x Per Week/15 Days Discharge Instructions: Apply moisturizing lotion as directed Topical: Gentamicin 1 x Per Week/15 Days Discharge Instructions: As directed by  physician Prim Dressing: KerraCel Ag Gelling Fiber Dressing, 4x5 in (silver alginate) (Generic) 1 x Per Week/15 Days ary Discharge Instructions: Apply silver alginate to wound bed as instructed Secondary Dressing: Woven Gauze Sponge, Non-Sterile 4x4 in (Generic) 1 x Per Week/15 Days Discharge Instructions: Apply over primary dressing as directed. Secondary Dressing: ABD Pad, 5x9 (Generic) 1 x Per Week/15 Days Discharge Instructions: Apply over primary dressing as directed. Secondary Dressing: Zetuvit Plus 4x8 in (Generic) 1 x Per Week/15 Days Discharge Instructions: Apply over primary dressing as directed. Compression Wrap: FourPress (4 layer compression wrap) (Generic) 1 x Per Week/15 Days Discharge Instructions: Apply four layer compression as directed. May also use Miliken CoFlex 2 layer compression system as alternative. Patient Medications llergies: peanut, lidocaine Shawn Notifications Medication Indication Start End 10/17/2020 Levaquin DOSE 1 - oral 750 mg tablet - 1 tablet oral taken 1 time per day for 14 days Electronic Signature(s) Signed: 10/17/2020 1:36:04 PM By: Lenda Kelp PA-C Entered By: Lenda Kelp on 10/17/2020 13:36:00 -------------------------------------------------------------------------------- Problem List Details Patient Name: Date of Service: [name], Shawn Oppenheim [name]. 10/17/2020 12:30 PM Medical Record Number: [id number] Patient Account Number: [id number] Date of Birth/Sex: Treating RN: [birth year] (40 y.o. Male) Fonnie Mu Primary Care Provider: Marcy Siren Other Clinician: Referring Provider: Treating Provider/Extender: Vicente Males in Treatment: 4 Active Problems ICD-10 Encounter Code Description Active Date MDM Diagnosis [phone]  Chronic venous hypertension (idiopathic) with ulcer and inflammation of right 09/19/2020 No Yes lower extremity I89.0 Lymphedema, not elsewhere classified 09/19/2020 No Yes L97.818  Non-pressure chronic ulcer of other part of right lower leg with other specified 09/19/2020 No Yes severity Inactive Problems Resolved Problems Electronic Signature(s) Signed: 10/17/2020 12:59:20 PM By: Lenda Kelp PA-C Entered By: Lenda Kelp on 10/17/2020 12:59:20 -------------------------------------------------------------------------------- Progress Note Details Patient Name: Date of Service: [name], [name]. 10/17/2020 12:30 PM Medical Record Number: [id number] Patient Account Number: [id number] Date of Birth/Sex: Treating RN: [birth year] (40 y.o. Male) Fonnie Mu Primary Care Provider: Marcy Siren Other Clinician: Referring Provider: Treating Provider/Extender: Vicente Males in Treatment: 4 Subjective Chief Complaint Information obtained from Patient pt has blockage or absence of r iliac vein 05/22/2018; patient is here for review of wound on his right lower extremity 09/19/2020; patient is again here for wound review of wounds on his right lower leg History of Present Illness (HPI) The patient is Shawn 40 yrs old bm here for evaluation of his right leg ulcer. He has an extensive history of lymphedema and ulcers. He is being treated at the [facility] by Dr. [name] with [name] boots and the [facility]. He has pumps at home but he is only using them once Shawn day. 08/30/14 selective debridement done of surface eschar. The wound cleans up quite nicely in the bed of this looks healthy. He is using Shawn wound VAC under an Unna wrap. 11/17/14; selective debridement done of surface eschar. Once again the wound beds at clean up quite nicely. He is no longer using Shawn wound VAC. Recent dressing changes include Hydrofera Blue. Apparently he has done Shawn wide variety of different topical dressings including advanced treatment options like Apligraf's without success. 03/21/15; surgical debridement done of surface eschar and nonviable subcutaneous tissue. The area on the medial aspect  of the right leg has closed and the wound on the lateral right leg looks improved has been using Silver Collagen 04/11/15; I think attendance here is sporadic. The area on the right medial leg remains healed. He has Shawn new tiny area on the back of the right leg. Again Shawn surgical debridement of the surface eschar and nonviable subcutaneous tissue of the major wound on the right lateral leg. He has been using Silver Collagen and at home, he does his own Unna wraps at home edema control seems reasonable. 04/20/15; the area on the right medial leg has Shawn very superficial area which may not even be open nevertheless I felt needed to be dressed today [this is his original chronic wound] the new wound is on the right anterior lateral leg is underwent Shawn surgical debridement. He has Shawn small wound on the right posterior leg. He puts on his own Unna boots, according to our nurses he does this fairly well. We have been using Silver collagen. 05/02/2015 -- I understand in the past his venous studies have been done at [facility] and he was advised weight loss before they would attempt to tackle his iliac vein blockage. He has not gone back for review. 06/27/2015 -- we have applied for Apligraf and are awaiting his insurance clearance. 07/25/2015 -- he still has to use her back from his insurance agent regarding his copayment for his Apligraf. 07/31/2015 -- the patient got Shawn reply from the insurance agent regarding the dollar payment for his application but is not sure whether it is for 5 or for one. 11/28/2015 -- has  been hurting Shawn little bit more and he has had change in color of his wound especially on the medial part. 12/05/2015 -- his culture was positive for Proteus mirabilis and K Oxytoca which are sensitive to ciprofloxacin which he is already on. 12/12/15oostill on ciprofloxacin. His mother reports of dressing had to be changed last Friday due to odor. He has not been systemically unwell 12/19/15; patient came  in today complaining of increasing pain and tightness in his upper right thigh. He completed the ciprofloxacin 2 or 3 days ago. He is not running Shawn fever today. 12/26/2015 -- last week he had been seen by Dr. Leanord Hawkingobson who got Shawn lower extremity venous duplex evaluation done which did not show DVT or SVT in the right lower extremity. he had also put him on Augmentin in addition to the previous ciprofloxacin and he had received for 2 weeks 01/02/2016 -- -- was admitted to the hospital on 12/26/2015 and discharged on 12/29/2015 and was treated for cellulitis. He was also newly diagnosed with type 2 diabetes mellitus and outpatient monitoring and initiation of treatment was recommended. Patientoos hemoglobin A1c was 6.6 No osteomyelitis detected on x-rays and recent Doppler study was negative for DVT Oral doxycycline and Levaquin were recommended for the patient as an . outpatient for Shawn 14 day treatment. IV Zosyn and vancomycin was given due to concerns of Pseudomonas infection while he was in hospital. 02/13/2016 -- he has not [facility] opinion earlier and I have urged him to regroup with them to see if there is any surgical options available. 02/27/2016 -- has an appointment to [facility] on January 3 and may be seeing Dr. Verdie DrownBill Marston 03/19/2016 -- I was not able to find any notes on Epic but the patient did go to the [facility] and saw the PA to Dr. Verdie DrownBill Marston. I understand she has recommended Shawn CT scan and MRI to be done on February 1 and they would review this with him on the same day. 04/09/2016 -- was admitted to the hospital on 03/20/2016 acute respiratory failure with hypoxia, abdominal discomfort with erythema, hypertensive urgency and chronic wound of the right leg with morbid obesity. he was discharged home on 04/05/2016 and was to continue on IV antibiotics for 18 more days follow-up with the  wound center and continue with his cardiologist. he has lost approximately 130 pounds and diuresed over 48 L. He was treated with IV Zosyn and ID recommended he continue this for 3 weeks more. [facility] were [facility] where he was seen by the PA and she had taken cultures which grew MSSA and Pseudomonas. She had recommended edema control with Shawn 4-layer compression and also referred him to the lymphedema clinic. Shawn CT venogram was also planned for the future. 04/16/2016 -- [facility] pelvic venogram runoff was done on 04/11/2016 -- IMPRESSION:-Limited study secondary to poor opacification of venous structures. No definite evidence of acute deep vein thrombosis. 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 -Extensive right iliac chain and right inguinal adenopathy, favored to be reactive/secondary to congestion. The patient continues on his IV antibiotics through his PICC line and has his cardiology opinion and also Shawn bariatric surgery opinion coming up. 04/30/2016 -- He has completed his IV antibiotics through his PICC line. 05/14/2016 --he was  seen by the PA, Ms Sluss, recommended alternate day dressing with compression and use Hibiclens and Dakin's solution with acetic acid on his wounds. 07/30/2016 -- he has still not got his custom-made compression stockings and I have urged him to go and get him self measured for these. 08/06/2016 -- he has been measured for his custom stocking and will get in 2 weeks. He has lost 20 pounds since March 08/27/2016 -- he has not got his custom stockings yet and he tried another pair which has not fit well and he has had Shawn lot of maceration increase the size of his wound. 09/03/2016 -- the patient says that he has not been very compliant with his dressing changes and his elevation and exercise and this week he is notices wound get rather large with  maceration. 09/18/16; according to our intake nurse the measurements on this patient's wound are slightly larger. I note previous compliance concerns. I note that his wounds measured larger last week which was noted by Dr. Meyer Russel. Culture done last week was negative. 10/01/2016 -- the patient has received some lymphedema pumps which are new and he says he is being compliant with this. He is going to be away for 2 weeks on vacation to [facility] 10/15/2016 -- he returns after 2 weeks and tells me he has been diligent with his dressing and has lost 25 pounds over the last 3 months. 10/22/2016 -- his last hemoglobin A1c was 6.2 and he is now diet controlled. 11/19/2016 -- he has been having problems with his compression stockings which are custom made at [facility]. He has not yet been able to get them. He had taken out his compression because he had gone there and had no dressing on when he came here today READMISSION 05/22/2018 [name] is Shawn now 40 year old man with Shawn Rossa history of wounds in his lower extremities secondary to chronic venous insufficiency with secondary lymphedema. He has had previous vein ligations on the right although I do not have this information in front of me. As I understand things he also has central venous obstruction with Shawn vein bypass in 2005 I believe this was done at [facility]. He tells me over the last 2 months he has noted mid reopening in the right mid anterior lower extremity. This is Shawn rectangular shaped wound which is kind of odd in terms of how that formed. He has been using silver alginate and Unna boots that we used on him when he was here in 2018. He buys his product supply online thinks this is cheaper than ordering through intermediary's Past medical history; CHF, morbid obesity, hypertension, hyperlipidemia, chronic venous insufficiency, vein bypass in 2005 previous vein ablations or ligations. He has Shawn stent in the right lower extremity. ABI in this clinic  was 1.04. He is using his compression pumps at home 3/27; 2-week follow-up. Right lower extremity wounds related to chronic venous insufficiency and lymphedema. He has been using silver alginate under an Radio broadcast assistant. He change the [employer] himself at home last week. He is making nice progress 4/10; 2-week follow-up. Right lower extremity wound is just about closed Shawn small open area in the middle of the and large rectangular wound he had at the beginning. His edema control is excellent. He says he is using his compression pumps religiously 4/17; 2-week follow-up. Right lower extremity wound is totally closed. He had Shawn large rectangular wound which is progressively closed. His edema control is very good. He is using  his compression pumps once to twice Shawn day READMISSION 09/19/2020 [name] is now Shawn 40 year old man we have had for several stays in this clinic including 2017, 2018 and most recently in 2020 with Shawn wound on his right lower leg. He has compression pumps which he claims to be using twice Shawn day. He also has stockings however I am not sure how much he uses them. Things have broken down over the last month or 2 he has an extensive wound area on the right mid tibial area I think this is similar to what he has had in the past. He has been using Xeroform and an Ace wrap that his girlfriend is applying. Past medical history; the patient has known chronic venous insufficiency with secondary lymphedema. He had an iliac vein blockage and he had Shawn bypass at [facility] although the patient is not followed up with them. The surgeon may have been Dr. Jacolyn Reedy. As noted he has lymphedema pumps. Type 2 diabetes congestive heart failure obstructive sleep apnea. He has Shawn history of Shawn right leg DVT although this may have been the iliac vein he is not currently on anticoagulation ABI in our clinic is 1.37 on the right Imaging Results - in this encounter CT Pelvic Venogram Run Off Imaging Results - CT Pelvic Venogram  Run Off Impressions Performed At -Limited study secondary to poor opacification of venous structures. Nodefinite evidence of acute deep vein thrombosis. 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 -Extensive right iliac chain and right inguinal adenopathy, favored to bereactive/secondary to congestion. [facility] Imaging Results - CT Pelvic Venogram Run Off Narrative Performed At EXAM: CT PELVIC VENOGRAM RUN OFF DATE: 04/11/2016 1:28 PM ACCESSION: [id number] UN DICTATED: 04/11/2016 2:18 PM INTERPRETATION LOCATION: [facility] CLINICAL INDICATION: 40 years old Male with h/o RLE DVT and massive lymphedema of both LE's. Eval for venous outflow obstruction vs extrinsic mass- L97.203-Skin ulcer of calf with necrosis of muscle, unspecified laterality(RAF-HCC) COMPARISON: CT abdomen pelvis dated 02/11/2007 TECHNIQUE: Shawn spiral CT scan was obtained approximately 3 minutes after administration of IV contrast from the kidneys to the knees. Images were reconstructed in the axial plane. Multiplanar reformatted and MIP images were provided for further evaluation of the vessels. For selected cases, 3D volumerendered images are also provided. VASCULAR FINDINGS: There is overall poor contrast opacification of the venous structures, limitingevaluation. IVC: No evidence of thrombus. Iliac veins: The right iliac veins are chronically occluded/diminutive in size. There are extensive venous collaterals noted throughout the right pelvis extending into the right lower extremity. The number and caliber of the venous collaterals have increased when compared to 02/11/2007. The left iliac veins appear patent. There are also small caliber venous collaterals within the left pelvis extending into the left lower extremity. Anterior abdominal wall venouscollaterals are also noted but fully imaged secondary to patient diameter. The  bilateral femoral and popliteal veins appear patent. There is no evidence of acute thrombus. The arterial vasculature is unremarkable on this venous phase time study. NONVASCULAR FINDINGS: LOWER CHEST Unremarkable. : ABDOMEN: HEPATOBILIARY: Unremarkable liver. No biliary ductal dilatation. Gallbladder isremarkable. PANCREAS: Unremarkable. SPLEEN: Unremarkable. ADRENAL GLANDS: Unremarkable. KIDNEYS/URETERS: Unremarkable. BLADDER: Unremarkable. BOWEL/PERITONEUM/RETROPERITONEUM: No bowel obstruction. No acute inflammatoryprocess. No ascites. LYMPH NODES: Extensive right iliac chain and right inguinal adenopathy. Shawn nodal conglomerate in the right inguinal region measures 6.8 x 3.4 cm (2:124). This is only slightly larger when compared to 02/11/2007. Given interval stabilityover 10 years, these are  favored to be reactive/secondary to congestion. REPRODUCTIVE: Unremarkable. BONES/SOFT TISSUES: No acute osseous abnormalities. Postsurgical changes in the right inguinal and left medial thigh region. Right greater than left softtissue edema throughout the lower extremities. [facility] Imaging Results - CT Pelvic Venogram Run Off Procedure Note Interface, Rad Results In - 04/11/2016 3:28 PM EST EXAM: CT PELVIC VENOGRAM RUN OFF DATE: 04/11/2016 1:28 PM ACCESSION: [id number] UN DICTATED: 04/11/2016 2:18 PM INTERPRETATION LOCATION: [facility] CLINICAL INDICATION: 40 years old Male with h/o RLE DVT and massive lymphedema of both LE's. Eval for venous outflow obstruction vs extrinsic mass- L97.203-Skin ulcer of calf with necrosis of muscle, unspecified laterality (RAF-HCC) COMPARISON: CT abdomen pelvis dated 02/11/2007 TECHNIQUE: Shawn spiral CT scan was obtained approximately 3 minutes after administration of IV contrast from the kidneys to the knees. Images were reconstructed in the axial plane. Multiplanar reformatted and MIP images were provided for further evaluation of the vessels. For selected cases, 3D  volume rendered images are also provided. VASCULAR FINDINGS: There is overall poor contrast opacification of the venous structures, limiting evaluation. IVC: No evidence of thrombus. Iliac veins: The right iliac veins are chronically occluded/diminutive in size. There are extensive venous collaterals noted throughout the right pelvis extending into the right lower extremity. The number and caliber of the venous collaterals have increased when compared to 02/11/2007. The left iliac veins appear patent. There are also small caliber venous collaterals within the left pelvis extending into the left lower extremity. Anterior abdominal wall venous collaterals are also noted but fully imaged secondary to patient diameter. The bilateral femoral and popliteal veins appear patent. There is no evidence of acute thrombus. The arterial vasculature is unremarkable on this venous phase time study. NONVASCULAR FINDINGS: LOWER CHEST Unremarkable. : ABDOMEN: HEPATOBILIARY: Unremarkable liver. No biliary ductal dilatation. Gallbladder is remarkable. PANCREAS: Unremarkable. SPLEEN: Unremarkable. ADRENAL GLANDS: Unremarkable. KIDNEYS/URETERS: Unremarkable. BLADDER: Unremarkable. BOWEL/PERITONEUM/RETROPERITONEUM: No bowel obstruction. No acute inflammatory process. No ascites. LYMPH NODES: Extensive right iliac chain and right inguinal adenopathy. Shawn nodal conglomerate in the right inguinal region measures 6.8 x 3.4 cm (2:124). This is only slightly larger when compared to 02/11/2007. Given interval stability over 10 years, these are favored to be reactive/secondary to congestion. REPRODUCTIVE: Unremarkable. BONES/SOFT TISSUES: No acute osseous abnormalities. Postsurgical changes in the right inguinal and left medial thigh region. Right greater than left soft tissue edema throughout the lower extremities. IMPRESSION: -Limited study secondary to poor opacification of venous structures. No definite evidence of  acute deep vein thrombosis. 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 -Extensive right iliac chain and right inguinal adenopathy, favored to be reactive/secondary to congestion. Imaging Results - CT Pelvic Venogram Run Off Performing Organization Address City/State/Zipcode Phone Number [facility] [name] [place], [place] [address] Surgical summary as listed below; Assessment: [name] is Shawn 39 y.o. male with Mckone history of right lower extremity chronic obstructive deep venous disease with recurrent ulceration, absence of right iliac vein, prior left to right femoral vein bypass. Patient continues with recurrent ulceration, his weight precludes him from further vascular studies at this time. If he is able to lose 50-100 pounds we would be able to do left lower extremity venogram possible intervention with 50% chance of re- opening occlusion per Dr. Jacolyn Reedy. For global health, weight loss, potential improvement in his chronic venous insufficiency would appreciateinput from bariatric surgery group for recommendations. 09/26/20; patient comes in today with not much improvement. He  has also some odor. 3 open areas and this linear area in his mid calf require debridement. We have been using silver alginate under compression. I gave him Keflex last week for what I thought was cellulitis in the right medial thigh he is completing this and I think this is better. 7/26; patient comes in today with again necrotic debris over these wounds. According to our intake nurse extreme malodor and drainage. We have been using silver alginate under compression. He is complaining about pain in the wound area. 7/29; Patient presents for follow-up. He reports improvement to the wound. He has finished taking Keflex. He has no issues or complaints today. 10/17/2020 upon evaluation today patient appears to be doing poorly in  regard to his leg ulcer. He has been tolerating the dressing changes without complication. Fortunately there does not appear to be any signs of active infection at this time. No fevers, chills, nausea, vomiting, or diarrhea. Objective Constitutional Obese and well-hydrated in no acute distress. Vitals Time Taken: 12:56 PM, Height: 74 in, Source: Stated, Weight: 425 lbs, Source: Stated, BMI: 54.6, Temperature: 98.8 F, Pulse: 90 bpm, Respiratory Rate: 20 breaths/min, Blood Pressure: 119/80 mmHg. Respiratory normal breathing without difficulty. Psychiatric this patient is able to make decisions and demonstrates good insight into disease process. Alert and Oriented x 3. pleasant and cooperative. General Notes: Upon inspection patient appears to still have very discolored tissue which does not appear to be healthy at all with regard to his leg. He has still some pus noted overall I do not feel like the Keflex really did much for him. I am really thinking that he is going require different oral antibiotic. I think Levaquin may be the best way to go. I discussed that with him today based on the PCR culture result I think this may be the best option. Integumentary (Hair, Skin) Wound #32 status is Open. Original cause of wound was Gradually Appeared. The date acquired was: 07/11/2020. The wound has been in treatment 4 weeks. The wound is located on the Right,Medial Lower Leg. The wound measures 6.5cm length x 22cm width x 0.3cm depth; 112.312cm^2 area and 33.694cm^3 volume. There is Fat Layer (Subcutaneous Tissue) exposed. There is no tunneling or undermining noted. There is Shawn large amount of purulent drainage noted. Foul odor after cleansing was noted. The wound margin is distinct with the outline attached to the wound base. There is small (1-33%) red granulation within the wound bed. There is Shawn large (67-100%) amount of necrotic tissue within the wound bed including Adherent Slough. Wound #33 status is  Open. Original cause of wound was Gradually Appeared. The date acquired was: 07/10/2020. The wound has been in treatment 4 weeks. The wound is located on the Right,Lateral Lower Leg. The wound measures 0.1cm length x 0.1cm width x 0.1cm depth; 0.008cm^2 area and 0.001cm^3 volume. There is Fat Layer (Subcutaneous Tissue) exposed. There is no tunneling or undermining noted. There is Shawn small amount of serosanguineous drainage noted. The wound margin is distinct with the outline attached to the wound base. There is large (67-100%) pink granulation within the wound bed. There is no necrotic tissue within the wound bed. Assessment Active Problems ICD-10 Chronic venous hypertension (idiopathic) with ulcer and inflammation of right lower extremity Lymphedema, not elsewhere classified Non-pressure chronic ulcer of other part of right lower leg with other specified severity Plan Follow-up Appointments: Return Appointment in 1 week. - Dr. [name] Bathing/ Shower/ Hygiene: May shower with protection but do not get wound  dressing(s) wet. - May use Shawn cast wrap to put over wraps to shower; you can buy these at [facility] or [facility] Edema Control - Lymphedema / SCD / Other: Lymphedema Pumps. Use Lymphedema pumps on leg(s) 2-3 times Shawn day for 45-60 minutes. If wearing any wraps or hose, do not remove them. Continue exercising as instructed. Elevate legs to the level of the heart or above for 30 minutes daily and/or when sitting, Shawn frequency of: Avoid standing for [place] periods of time. Patient to wear own compression stockings every day. - on Left Leg The following medication(s) was prescribed: Levaquin oral 750 mg tablet 1 1 tablet oral taken 1 time per day for 14 days starting 10/17/2020 WOUND #32: - Lower Leg Wound Laterality: Right, Medial Cleanser: Soap and Water 2 x Per Week/15 Days Discharge Instructions: May shower and wash wound with dial antibacterial soap and water prior to dressing change. Cleanser: Wound  Cleanser (Generic) 2 x Per Week/15 Days Discharge Instructions: Cleanse the wound with wound cleanser prior to applying Shawn clean dressing using gauze sponges, not tissue or cotton balls. Peri-Wound Care: Triamcinolone 15 (g) 2 x Per Week/15 Days Discharge Instructions: Use triamcinolone 15 (g) as directed Peri-Wound Care: Sween Lotion (Moisturizing lotion) 2 x Per Week/15 Days Discharge Instructions: Apply moisturizing lotion as directed Prim Dressing: KerraCel Ag Gelling Fiber Dressing, 4x5 in (silver alginate) (Generic) 2 x Per Week/15 Days ary Discharge Instructions: Apply silver alginate to wound bed as instructed Secondary Dressing: Woven Gauze Sponge, Non-Sterile 4x4 in (Generic) 2 x Per Week/15 Days Discharge Instructions: Apply over primary dressing as directed. Secondary Dressing: ABD Pad, 5x9 (Generic) 2 x Per Week/15 Days Discharge Instructions: Apply over primary dressing as directed. Secondary Dressing: Zetuvit Plus 4x8 in (Generic) 2 x Per Week/15 Days Discharge Instructions: Apply over primary dressing as directed. Com pression Wrap: FourPress (4 layer compression wrap) (Generic) 2 x Per Week/15 Days Discharge Instructions: Apply four layer compression as directed. May also use [place] CoFlex 2 layer compression system as alternative. WOUND #33: - Lower Leg Wound Laterality: Right, Lateral Cleanser: Soap and Water 1 x Per Week/15 Days Discharge Instructions: May shower and wash wound with dial antibacterial soap and water prior to dressing change. Cleanser: Wound Cleanser (Generic) 1 x Per Week/15 Days Discharge Instructions: Cleanse the wound with wound cleanser prior to applying Shawn clean dressing using gauze sponges, not tissue or cotton balls. Peri-Wound Care: Triamcinolone 15 (g) 1 x Per Week/15 Days Discharge Instructions: Use triamcinolone 15 (g) as directed Peri-Wound Care: Sween Lotion (Moisturizing lotion) 1 x Per Week/15 Days Discharge Instructions: Apply moisturizing  lotion as directed Topical: Gentamicin 1 x Per Week/15 Days Discharge Instructions: As directed by physician Prim Dressing: KerraCel Ag Gelling Fiber Dressing, 4x5 in (silver alginate) (Generic) 1 x Per Week/15 Days ary Discharge Instructions: Apply silver alginate to wound bed as instructed Secondary Dressing: Woven Gauze Sponge, Non-Sterile 4x4 in (Generic) 1 x Per Week/15 Days Discharge Instructions: Apply over primary dressing as directed. Secondary Dressing: ABD Pad, 5x9 (Generic) 1 x Per Week/15 Days Discharge Instructions: Apply over primary dressing as directed. Secondary Dressing: Zetuvit Plus 4x8 in (Generic) 1 x Per Week/15 Days Discharge Instructions: Apply over primary dressing as directed. Com pression Wrap: FourPress (4 layer compression wrap) (Generic) 1 x Per Week/15 Days Discharge Instructions: Apply four layer compression as directed. May also use Miliken CoFlex 2 layer compression system as alternative. 1. Would recommend currently that we go ahead and initiate treatment with Levaquin 750 mg daily  for 14 days. 2. I am also can recommend that as soon as he gets the topical [facility] pharmacy antibiotics he should start using those as well. 3. I am also can recommend that we going continue with the compression wrap his significant other is actually good to be doing these for him at home as well. That way can be changed more frequently which will be good. 4. I would also recommend he continue to elevate his legs much as possible to try to help with edema control I think that still can to be of utmost importance. We will see patient back for reevaluation in 1 week here in the clinic. If anything worsens or changes patient will contact our office for additional recommendations. Electronic Signature(s) Signed: 10/17/2020 1:36:21 PM By: Lenda Kelp PA-C Entered By: Lenda Kelp on 10/17/2020  13:36:20 -------------------------------------------------------------------------------- SuperBill Details Patient Name: Date of Service: [name], Shawn DRIA Dorris Carnes [name]. 10/17/2020 Medical Record Number: [id number] Patient Account Number: [id number] Date of Birth/Sex: Treating RN: 18-Aug-1980 (40 y.o. Male) Fonnie Mu Primary Care Provider: Marcy Siren Other Clinician: Referring Provider: Treating Provider/Extender: Vicente Males in Treatment: 4 Diagnosis Coding ICD-10 Codes Code Description [phone] Chronic venous hypertension (idiopathic) with ulcer and inflammation of right lower extremity I89.0 Lymphedema, not elsewhere classified L97.818 Non-pressure chronic ulcer of other part of right lower leg with other specified severity Physician Procedures : CPT4 Code Description Modifier [id number] - WC PHYS LEVEL 4 - EST PT ICD-10 Diagnosis Description I87.331 Chronic venous hypertension (idiopathic) with ulcer and inflammation of right lower extremity I89.0 Lymphedema, not elsewhere classified  L97.818 Non-pressure chronic ulcer of other part of right lower leg with other specified severity Quantity: 1 Electronic Signature(s) Signed: 10/17/2020 1:36:40 PM By: Lenda Kelp PA-C Entered By: Lenda Kelp on 10/17/2020 13:36:40

## 2020-10-24 ENCOUNTER — Encounter: Payer: Self-pay | Admitting: *Deleted

## 2020-10-29 NOTE — Progress Notes (Signed)
Office Visit    Patient Name: [name] Date of Encounter: 10/30/2020  PCP:  Nicolette Bang, MD   [place]  Cardiologist:  Skeet Latch, MD  Advanced Practice Provider:  No care team member to display Electrophysiologist:  None    Chief Complaint    [name] is a 40 y.o. male with a hx of chronic heart failure, hypertension, hyperlipidemia, diabetes, morbid obesity, OSA, venous insufficiency presents today for heart failure follow up.  Past Medical History    Past Medical History:  Diagnosis Date   Chronic venous insufficiency    a. as a premature infant, required venous access for care with subsequent DVT and some sort of procedure -> eventually went on to have vein bypass around 2005.   Hypertension    Lymphedema    Morbid obesity ([place])    OSA on CPAP    Past Surgical History:  Procedure Laterality Date   balloon stenting Right    right leg   VEIN LIGATION AND STRIPPING      Allergies  Allergies  Allergen Reactions   Lidocaine Other (See Comments)    "burns my skin"   Peanut-Containing Drug Products Swelling    Tree nuts    History of Present Illness    [name] is a 40 y.o. male with a hx of chronic heart failure, hypertension, hyperlipidemia, diabetes, morbid obesity, OSA, venous insufficiency last seen 10/06/20.  He was admitted 03/20/16-04/05/16 for acute heart failure. Echocardiogram was not interpretable due to body habitus. He was diuresed with IV Lasix with negative output of 47.2 L. H e was discharged from 631 pounds on admission to 488 pounds on discharge. His creatnine remained stable.   He was last seen 05/04/18 by Dr. Oval Linsey. He was going to the gym 3-4 days per week. He was wearing Haematologist. He was recommended for follow up in 2 months which was not completed.  Seen 10/06/20 with worsening LE edema, dyspnea on exertion. Metolazone 2.82m one tablet thirty minutes prior to Furosemide was  started on Saturdays and Tuesdays.   Presents today for follow up.  He has a myriad of concerns. Tells me he is "still feeling some different stuff" but cannot describe. He recently got back from a cruise. Tells me it feels like a "fluttering". Tells me it is happening today and happened on his recent trip cruise.  Reports he may have been dehydrated on recent cruise.  He was started on Gabapentin by primary care for shoulder and hand numbness  but stopped taking while on his recent trip.  Notes persistent bilateral hand numbness and encouraged to follow-up with primary care provider.  Also reports a slight stomach ace "like something going on in my stomach". Does report a daily bowel movement.   He has been taking his Metolazone on Tuesdays and Saturdays. Tell sme he did not have repeat lab work done as his primary care fvisit was transitioned to virtual. [facility]our[facility] [place] [facility]   His wife tells me he felt "motion sick" per his description on the trip but she thought the symptoms were different than motion sickness.. But was more symptomatic with elevated heart rate in the 150s with movement, shortness of breath, heavy sweating.   EKGs/Labs/Other Studies Reviewed:   The following studies were reviewed today:   EKG: EKG is ordered today.  The ekg performed today demonstrates NSR 91 bpm  with no acute ST/T wave changes.  Recent Labs: 09/08/2020: BUN 14; Creatinine, Ser 1.09; Potassium 3.9; Sodium 141  Recent Lipid Panel    Component Value Date/Time   CHOL 174 06/23/2019 1750   TRIG 122 06/23/2019 1750   HDL 41 06/23/2019 1750   CHOLHDL 4.2 06/23/2019 1750   LDLCALC 111 (H) 06/23/2019 1750   Home Medications   Current Meds  Medication Sig   Accu-Chek Softclix Lancets lancets Use as instructed   carvedilol (COREG) 12.5 MG tablet Take 1 tablet (12.5 mg total) by mouth 2 (two) times daily.   furosemide (LASIX) 80 MG tablet Take 1 tablet (80  mg total) by mouth 2 (two) times daily.   gabapentin (NEURONTIN) 300 MG capsule Take 1 capsule (300 mg total) by mouth at bedtime as needed.   glucose blood (ACCU-CHEK AVIVA PLUS) test strip Use as instructed   levofloxacin (LEVAQUIN) 750 MG tablet Take 750 mg by mouth daily.   lisinopril (ZESTRIL) 40 MG tablet Take 1 tablet (40 mg total) by mouth daily.   meloxicam (MOBIC) 15 MG tablet Take 1 tablet (15 mg total) by mouth daily.   metFORMIN (GLUCOPHAGE) 500 MG tablet Take 1 tablet (500 mg total) by mouth daily with breakfast.   metolazone (ZAROXOLYN) 2.5 MG tablet Take one tablet 30 minutes prior to your morning Furosemide (Lasix) on Saturdays and Tuesday or as directed by cardiology   Misc. Devices (SCD SOFT SLEEVES/KNEE LENGTH) MISC 2 each by Does not apply route as needed.   potassium chloride SA (KLOR-CON) 20 MEQ tablet Take 2 tablets (40 mEq total) by mouth 2 (two) times daily.   spironolactone (ALDACTONE) 25 MG tablet Take 1 tablet (25 mg total) by mouth daily.   traMADol (ULTRAM) 50 MG tablet Take 50 mg by mouth every 6 (six) hours as needed for moderate pain.   [DISCONTINUED] baclofen (LIORESAL) 10 MG tablet Take 1 tablet (10 mg total) by mouth 3 (three) times daily.   [DISCONTINUED] cephALEXin (KEFLEX) 500 MG capsule Take 500 mg by mouth every 6 (six) hours.   [DISCONTINUED] doxycycline (VIBRAMYCIN) 100 MG capsule Take 1 capsule (100 mg total) by mouth 2 (two) times daily.     Review of Systems    All other systems reviewed and are otherwise negative except as noted above.  Physical Exam    VS:  BP 118/80 (BP Location: Left Arm, Patient Position: Sitting, Cuff Size: Large)   Pulse 95   Ht _0  (1.88 m)   Wt (!) 484 lb 6.4 oz (219.7 kg)   SpO2 98%   BMI 62.19 kg/m  , BMI Body mass index is 62.19 kg/m.  Wt Readings from Last 3 Encounters:  10/30/20 (!) 484 lb 6.4 oz (219.7 kg)  10/06/20 (!) 490 lb (222.3 kg)  09/08/20 (!) 525 lb (238.1 kg)    GEN: Well nourished,  morbidly obese, well developed, in no acute distress. HEENT: normal. Neck: Supple, no JVD, carotid bruits, or masses. Cardiac: RRR, no murmurs, rubs, or gallops. No clubbing, cyanosis.  Radials/PT 2+ and equal bilaterally.  Respiratory:  Respirations regular and unlabored, clear to auscultation bilaterally. GI: Soft, nontender, nondistended. MS: No deformity or atrophy. Skin: Warm and dry, no rash. RLE in [employer]. Bilateral LE with woody edema. Neuro:  Strength and sensation are intact. Psych: Normal affect.  Assessment & Plan    Chronic heart failure, type unknown - Volume status difficult to ascertain due to body habitus. Down 6 pounds since last clinic visit and  addition of metolazone twice per week.  Low suspicion for dietary compliance as he recently returned from a cruise.. Bilateral LE with woody edema. RLE with Unna boot in place. Tells me edema overall improved. No recent weights as just got back from cruise. GDMT includes Lisinopril, Carvedilol, Lasix, Metolazone, Spironolactone, Metolazone two times per week. Future considerations include transition to [facility] or addition of SLGT2i. Discussion regarding fluid restriction <2L and low sodium diet.  Discussed the importance of adherence with heart failure lifestyle changes.  Labs today including CMP, CBC, TSH, magnesium.  HTN -  BP well controlled. Continue current antihypertensive regimen.    Morbid obesity - Weight loss via diet and exercise encouraged. Discussed the impact being overweight would have on cardiovascular risk, heart failure, hypertension.   OSA - Repeat sleep study ordered by primary care.  Hand numbness - Started on gabapentin by primary care provider. Reports continued numbness. Encouraged to follow up with his primary care provider.   Palpitations - EKG today shows normal sinus rhythm no acute ST/T wave changes.  We discussed that dehydration are stress related to recent symptoms could be contributory to  palpitations.. Continue current dose Carvedilol. Recommend avoidance of caffeine. TSH, magnesium, CMP ordered to assess for electrolyte or thyroid abnormality as contributory.   Disposition: Follow up in 6 week(s)  with Dr. Oval Linsey or APP.  Signed, Loel Dubonnet, NP 10/30/2020, 9:03 AM [place]

## 2020-10-30 ENCOUNTER — Ambulatory Visit (INDEPENDENT_AMBULATORY_CARE_PROVIDER_SITE_OTHER): Payer: 59 | Admitting: Family

## 2020-10-30 ENCOUNTER — Other Ambulatory Visit: Payer: Self-pay

## 2020-10-30 ENCOUNTER — Encounter (HOSPITAL_BASED_OUTPATIENT_CLINIC_OR_DEPARTMENT_OTHER): Payer: Self-pay | Admitting: Family

## 2020-10-30 VITALS — BP 118/80 | HR 95 | Ht 74.0 in | Wt >= 6400 oz

## 2020-10-30 DIAGNOSIS — R002 Palpitations: Secondary | ICD-10-CM

## 2020-10-30 DIAGNOSIS — G4733 Obstructive sleep apnea (adult) (pediatric): Secondary | ICD-10-CM

## 2020-10-30 DIAGNOSIS — I5042 Chronic combined systolic (congestive) and diastolic (congestive) heart failure: Secondary | ICD-10-CM | POA: Diagnosis not present

## 2020-10-30 DIAGNOSIS — I10 Essential (primary) hypertension: Secondary | ICD-10-CM

## 2020-10-30 DIAGNOSIS — R0609 Other forms of dyspnea: Secondary | ICD-10-CM

## 2020-10-30 DIAGNOSIS — R0600 Dyspnea, unspecified: Secondary | ICD-10-CM

## 2020-10-30 MED ORDER — FUROSEMIDE 80 MG PO TABS: 80.0000 mg | ORAL_TABLET | Freq: Two times a day (BID) | ORAL | 2 refills | Status: DC

## 2020-10-30 MED ORDER — CARVEDILOL 12.5 MG PO TABS: 12.5000 mg | ORAL_TABLET | Freq: Two times a day (BID) | ORAL | 2 refills | Status: DC

## 2020-10-30 MED ORDER — LISINOPRIL 40 MG PO TABS: 40.0000 mg | ORAL_TABLET | Freq: Every day | ORAL | 2 refills | Status: DC

## 2020-10-30 NOTE — Patient Instructions (Addendum)
Medication Instructions:  Continue your current medications.   We have sent refills of your cardiac medications to your pharmacy.   *If you need a refill on your cardiac medications before your next appointment, please call your pharmacy*  Lab Work: Your physician recommends that you return for lab work today at [facility] for CMP, magnesium, CBC, TSH  This will let us check your kidneys, liver, electrolytes, thyroid, and blood counts to assess cause of your symptoms.  If you have labs (blood work) drawn today and your tests are completely normal, you will receive your results only by: MyChart Message (if you have MyChart) OR A paper copy in the mail If you have any lab test that is abnormal or we need to change your treatment, we will call you to review the results.  Testing/Procedures: Complete sleep study as ordered by primary care provider.   Your EKG today showed normal sinus rhythm with no abnormal beats nor signs of blockages. This is a good result!  Follow-Up: At [facility], you and your health needs are our priority.  As part of our continuing mission to provide you with exceptional heart care, we have created designated Provider Care Teams.  These Care Teams include your primary Cardiologist (physician) and Advanced Practice Providers (APPs -  Physician Assistants and Nurse Practitioners) who all work together to provide you with the care you need, when you need it.  We recommend signing up for the patient portal called "MyChart".  Sign up information is provided on this After Visit Summary.  MyChart is used to connect with patients for Virtual Visits (Telemedicine).  Patients are able to view lab/test results, encounter notes, upcoming appointments, etc.  Non-urgent messages can be sent to your provider as well.   To learn more about what you can do with MyChart, go to [email or web address].    Your next appointment:   6 week(s)  The format for your next appointment:    In Person  Provider:   Chilton Si, MD or Alver Sorrow, NP    Other Instructions  Recommend continuing Gabapentin for your hand numbness and if it does not improve discuss with primary care at upcoming appointment. Numbness is most often a nerve related injury. Your pulses and blood flow in your hands were good on exam today.  Recommend eating a bland diet for a few days as your stomach settles after cruise.   To prevent palpitations: Avoid and/or limit caffeine containing beverages like soda or tea. Exercise regularly.  Manage stress well. Some over the counter medications can cause palpitations such as Benadryl, AdvilPM, TylenolPM. Regular Advil or Tylenol do not cause palpitations.    To prevent or reduce lower extremity swelling: Eat a low salt diet. Salt makes the body hold onto extra fluid which causes swelling. Sit with legs elevated. For example, in the recliner or on an ottoman.  Wear knee-high compression stockings during the daytime. Ones labeled 15-20 mmHg provide good compression.

## 2020-10-31 ENCOUNTER — Encounter (HOSPITAL_BASED_OUTPATIENT_CLINIC_OR_DEPARTMENT_OTHER): Payer: 59 | Admitting: Internal Medicine

## 2020-10-31 ENCOUNTER — Telehealth: Payer: Self-pay | Admitting: *Deleted

## 2020-10-31 DIAGNOSIS — E11622 Type 2 diabetes mellitus with other skin ulcer: Secondary | ICD-10-CM | POA: Diagnosis not present

## 2020-10-31 DIAGNOSIS — I5042 Chronic combined systolic (congestive) and diastolic (congestive) heart failure: Secondary | ICD-10-CM

## 2020-10-31 LAB — COMPREHENSIVE METABOLIC PANEL
ALT: 17 IU/L (ref 0–44)
AST: 20 IU/L (ref 0–40)
Albumin/Globulin Ratio: 1.4 (ref 1.2–2.2)
Albumin: 4.5 g/dL (ref 4.0–5.0)
Alkaline Phosphatase: 71 IU/L (ref 44–121)
BUN/Creatinine Ratio: 27 — ABNORMAL HIGH (ref 9–20)
BUN: 39 mg/dL — ABNORMAL HIGH (ref 6–24)
Bilirubin Total: 0.3 mg/dL (ref 0.0–1.2)
CO2: 24 mmol/L (ref 20–29)
Calcium: 9.8 mg/dL (ref 8.7–10.2)
Chloride: 92 mmol/L — ABNORMAL LOW (ref 96–106)
Creatinine, Ser: 1.44 mg/dL — ABNORMAL HIGH (ref 0.76–1.27)
Globulin, Total: 3.3 g/dL (ref 1.5–4.5)
Glucose: 128 mg/dL — ABNORMAL HIGH (ref 65–99)
Potassium: 3.6 mmol/L (ref 3.5–5.2)
Sodium: 136 mmol/L (ref 134–144)
Total Protein: 7.8 g/dL (ref 6.0–8.5)
eGFR: 63 mL/min/{1.73_m2} (ref >59)

## 2020-10-31 LAB — CBC
Hematocrit: 45.2 % (ref 37.5–51.0)
Hemoglobin: 14.7 g/dL (ref 13.0–17.7)
MCH: 24.5 pg — ABNORMAL LOW (ref 26.6–33.0)
MCHC: 32.5 g/dL (ref 31.5–35.7)
MCV: 75 fL — ABNORMAL LOW (ref 79–97)
Platelets: 245 10*3/uL (ref 150–450)
RBC: 6.01 x10E6/uL — ABNORMAL HIGH (ref 4.14–5.80)
RDW: 15.9 % — ABNORMAL HIGH (ref 11.6–15.4)
WBC: 8.4 10*3/uL (ref 3.4–10.8)

## 2020-10-31 LAB — TSH: TSH: 2.19 u[IU]/mL (ref 0.450–4.500)

## 2020-10-31 LAB — MAGNESIUM: Magnesium: 2.1 mg/dL (ref 1.6–2.3)

## 2020-10-31 NOTE — Telephone Encounter (Signed)
The patient has been notified of the result and verbalized understanding.  All questions (if any) were answered. Aware to have BMP rechecked Aug 30 ,2022. Lab order placed. Placed Metolazone on hold - medication list  , Cherre Robins 10/31/2020 3:41 PM

## 2020-10-31 NOTE — Progress Notes (Addendum)
Givan, Brittany T ([id number]) . Visit Report for 10/31/2020 Arrival Information Details Patient Name: Date of Service: [name] 10/31/2020 2:15 PM Medical Record Number: [id number] Patient Account Number: [id number] Date of Birth/Sex: Treating RN: [birth year] (40 y.o. [name] Primary Care Kodie Kishi: Marcy Siren Other Clinician: Referring Janara Klett: Treating Odelia Graciano/Extender: [name] in Treatment: 6 Visit Information History Since Last Visit Added or deleted any medications: No Patient Arrived: Ambulatory Any new allergies or adverse reactions: No Arrival Time: 15:16 Had a fall or experienced change in No Transfer Assistance: None activities of daily living that may affect Patient Identification Verified: Yes risk of falls: Secondary Verification Process Completed: Yes Signs or symptoms of abuse/neglect since last visito No Patient Requires Transmission-Based Precautions: No Hospitalized since last visit: No Patient Has Alerts: No Implantable device outside of the clinic excluding No cellular tissue based products placed in the center since last visit: Has Dressing in Place as Prescribed: Yes Has Compression in Place as Prescribed: Yes Pain Present Now: No Electronic Signature(s) Signed: 10/31/2020 5:51:35 PM By: Antonieta Iba Entered By: Antonieta Iba on 10/31/2020 15:20:25 -------------------------------------------------------------------------------- Compression Therapy Details Patient Name: Date of Service: Shawn Bering T. 10/31/2020 2:15 PM Medical Record Number: [id number] Patient Account Number: [id number] Date of Birth/Sex: Treating RN: Mar 22, 1980 (40 y.o. [name], [name] Primary Care Alvie Speltz: Marcy Siren Other Clinician: Referring Gabriana Wilmott: Treating Venicia Vandall/Extender: [name] in Treatment: 6 Compression Therapy Performed for Wound Assessment: Wound #32  Right,Medial Lower Leg Performed By: Clinician Fonnie Mu, RN Compression Type: Four Layer Post Procedure Diagnosis Same as Pre-procedure Electronic Signature(s) Signed: 10/31/2020 5:31:05 PM By: Fonnie Mu RN Entered By: Fonnie Mu on 10/31/2020 17:12:32 -------------------------------------------------------------------------------- Lower Extremity Assessment Details Patient Name: Date of Service: [name], Theodosia Quay 10/31/2020 2:15 PM Medical Record Number: [id number] Patient Account Number: [id number] Date of Birth/Sex: Treating RN: 25-Nov-1980 (40 y.o. [name] Primary Care Yasmine Kilbourne: Marcy Siren Other Clinician: Referring Sueellen Kayes: Treating Tammie Yanda/Extender: [name] in Treatment: 6 Edema Assessment Assessed: Kyra Searles: No] Franne Forts: Yes] Edema: [Left: Ye] [Right: s] Calf Left: Right: Point of Measurement: 36 cm From Medial Instep 57 cm Ankle Left: Right: Point of Measurement: 10 cm From Medial Instep 33.8 cm Vascular Assessment Pulses: Dorsalis Pedis Palpable: [Right:Yes] Electronic Signature(s) Signed: 10/31/2020 5:51:35 PM By: Antonieta Iba Entered By: Antonieta Iba on 10/31/2020 15:29:27 -------------------------------------------------------------------------------- Multi Wound Chart Details Patient Name: Date of Service: Shawn Bering T. 10/31/2020 2:15 PM Medical Record Number: [id number] Patient Account Number: [id number] Date of Birth/Sex: Treating RN: 11/06/1980 (40 y.o. [name], [name] Primary Care Johnetta Sloniker: Marcy Siren Other Clinician: Referring Jaun Galluzzo: Treating Jenette Rayson/Extender: [name] in Treatment: 6 Vital Signs Height(in): 74 Pulse(bpm): 107 Weight(lbs): 425 Blood Pressure(mmHg): 132/81 Body Mass Index(BMI): 55 Temperature(F): 98.3 Respiratory Rate(breaths/min): 18 Photos: [32:No Photos Right, Medial Lower Leg] [33:No Photos  Right, Lateral Lower Leg] [N/A:N/A N/A] Wound Location: [32:Gradually Appeared] [33:Gradually Appeared] [N/A:N/A] Wounding Event: [32:Lymphedema] [33:Lymphedema] [N/A:N/A] Primary Etiology: [32:Lymphedema, Deep Vein Thrombosis,] [33:Lymphedema, Deep Vein Thrombosis,] [N/A:N/A] Comorbid History: [32:Hypertension, Peripheral Venous Disease, Type II Diabetes 07/11/2020] [33:Hypertension, Peripheral Venous Disease, Type II Diabetes 07/10/2020] [N/A:N/A] Date Acquired: [32:6] [33:6] [N/A:N/A] Weeks of Treatment: [32:Open] [33:Healed - Epithelialized] [N/A:N/A] Wound Status: [32:No] [33:Yes] [N/A:N/A] Clustered Wound: [32:7.2x21x0.3] [33:0x0x0] [N/A:N/A] Measurements L x W x D (cm) [32:118.752] [33:0] [N/A:N/A] A (cm) : rea [32:35.626] [33:0] [N/A:N/A] Volume (cm) : [32:-10.00%] [33:100.00%] [N/A:N/A] % Reduction in Area: [32:17.50%] [  33:100.00%] [N/A:N/A] % Reduction in Volume: [32:Full Thickness Without Exposed] [33:Full Thickness Without Exposed] [N/A:N/A] Classification: [32:Support Structures Large] [33:Support Structures N/A] [N/A:N/A] Exudate A mount: [32:Purulent] [33:N/A] [N/A:N/A] Exudate Type: [32:yellow, brown, green] [33:N/A] [N/A:N/A] Exudate Color: [32:Distinct, outline attached] [33:N/A] [N/A:N/A] Wound Margin: [32:Medium (34-66%)] [33:N/A] [N/A:N/A] Granulation A mount: [32:Red] [33:N/A] [N/A:N/A] Granulation Quality: [32:Medium (34-66%)] [33:N/A] [N/A:N/A] Necrotic A mount: [32:Fat Layer (Subcutaneous Tissue): Yes N/A] [N/A:N/A] Exposed Structures: [32:Fascia: No Tendon: No Muscle: No Joint: No Bone: No Small (1-33%)] [33:N/A] [N/A:N/A] Epithelialization: [32:Debridement - Excisional] [33:N/A] [N/A:N/A] Debridement: Pre-procedure Verification/Time Out 16:28 [33:N/A] [N/A:N/A] Taken: [32:Lidocaine] [33:N/A] [N/A:N/A] Pain Control: [32:Subcutaneous, Slough] [33:N/A] [N/A:N/A] Tissue Debrided: [32:Skin/Subcutaneous Tissue] [33:N/A] [N/A:N/A] Level: [32:25] [33:N/A]  [N/A:N/A] Debridement A (sq cm): [32:rea Curette] [33:N/A] [N/A:N/A] Instrument: [32:Minimum] [33:N/A] [N/A:N/A] Bleeding: [32:Pressure] [33:N/A] [N/A:N/A] Hemostasis A chieved: [32:0] [33:N/A] [N/A:N/A] Procedural Pain: [32:0] [33:N/A] [N/A:N/A] Post Procedural Pain: [32:Procedure was tolerated well] [33:N/A] [N/A:N/A] Debridement Treatment Response: [32:7.2x21x0.3] [33:N/A] [N/A:N/A] Post Debridement Measurements L x W x D (cm) [32:35.626] [33:N/A] [N/A:N/A] Post Debridement Volume: (cm) [32:Compression Therapy] [33:N/A] [N/A:N/A] Procedures Performed: [32:Debridement] Treatment Notes Electronic Signature(s) Signed: 11/01/2020 8:02:04 AM By: Baltazar Najjar MD Signed: 11/01/2020 5:14:18 PM By: Fonnie Mu RN Entered By: Baltazar Najjar on 11/01/2020 07:32:13 -------------------------------------------------------------------------------- Multi-Disciplinary Care Plan Details Patient Name: Date of Service: [name], [name] T. 10/31/2020 2:15 PM Medical Record Number: [id number] Patient Account Number: [id number] Date of Birth/Sex: Treating RN: [birth year] (40 y.o. [name], [name] Primary Care Alix Lahmann: Marcy Siren Other Clinician: Referring Caz Weaver: Treating Kaloni Bisaillon/Extender: [name] in Treatment: 6 Active Inactive Wound/Skin Impairment Nursing Diagnoses: Impaired tissue integrity Knowledge deficit related to ulceration/compromised skin integrity Goals: Patient will have a decrease in wound volume by X% from date: (specify in notes) Date Initiated: 09/19/2020 Target Resolution Date: 11/30/2020 Goal Status: Active Patient/caregiver will verbalize understanding of skin care regimen Date Initiated: 09/19/2020 Target Resolution Date: 01/06/2021 Goal Status: Active Ulcer/skin breakdown will have a volume reduction of 30% by week 4 Date Initiated: 09/19/2020 Target Resolution Date: 01/08/2021 Goal Status: Active Ulcer/skin  breakdown will have a volume reduction of 50% by week 8 Date Initiated: 09/19/2020 Target Resolution Date: 01/08/2021 Goal Status: Active Interventions: Assess patient/caregiver ability to obtain necessary supplies Assess patient/caregiver ability to perform ulcer/skin care regimen upon admission and as needed Assess ulceration(s) every visit Notes: Electronic Signature(s) Signed: 10/31/2020 5:31:05 PM By: Fonnie Mu RN Entered By: Fonnie Mu on 10/31/2020 17:11:30 -------------------------------------------------------------------------------- Pain Assessment Details Patient Name: Date of Service: Shawn Bering T. 10/31/2020 2:15 PM Medical Record Number: [id number] Patient Account Number: [id number] Date of Birth/Sex: Treating RN: Mar 08, 1981 (40 y.o. [name] Primary Care Tetsuo Coppola: Marcy Siren Other Clinician: Referring Chimamanda Siegfried: Treating Syriana Croslin/Extender: [name] in Treatment: 6 Active Problems Location of Pain Severity and Description of Pain Patient Has Paino No Site Locations Pain Management and Medication Current Pain Management: Electronic Signature(s) Signed: 10/31/2020 5:51:35 PM By: Antonieta Iba Entered By: Antonieta Iba on 10/31/2020 15:20:37 -------------------------------------------------------------------------------- Patient/Caregiver Education Details Patient Name: Date of Service: [name] 8/23/2022andnbsp2:15 PM Medical Record Number: [id number] Patient Account Number: [id number] Date of Birth/Gender: Treating RN: [birth year] (40 y.o. [name] Primary Care Physician: Marcy Siren Other Clinician: Referring Physician: Treating Physician/Extender: [name] in Treatment: 6 Education Assessment Education Provided To: Patient Education Topics Provided Wound/Skin Impairment: Methods: Explain/Verbal Responses: Reinforcements  needed, Return demonstration correctly, See progress note, State content correctly Electronic Signature(s) Signed: 10/31/2020 5:31:05 PM  By: Fonnie Mu RN Entered By: Fonnie Mu on 10/31/2020 17:11:56 -------------------------------------------------------------------------------- Wound Assessment Details Patient Name: Date of Service: [name], [name] T. 10/31/2020 2:15 PM Medical Record Number: [id number] Patient Account Number: [id number] Date of Birth/Sex: Treating RN: 1981/01/10 (40 y.o. [name] Primary Care Alaya Iverson: Marcy Siren Other Clinician: Referring Alonso Gapinski: Treating Evita Merida/Extender: [name] in Treatment: 6 Wound Status Wound Number: [id number] Primary Lymphedema Etiology: Wound Location: Right, Medial Lower Leg Wound Open Wounding Event: Gradually Appeared Status: Date Acquired: 07/11/2020 Comorbid Lymphedema, Deep Vein Thrombosis, Hypertension, Peripheral Weeks Of Treatment: 6 History: Venous Disease, Type II Diabetes Clustered Wound: No Wound Measurements Length: (cm) 7.2 Width: (cm) 21 Depth: (cm) 0.3 Area: (cm) 118.752 Volume: (cm) 35.626 % Reduction in Area: -10% % Reduction in Volume: 17.5% Epithelialization: Small (1-33%) Tunneling: No Undermining: No Wound Description Classification: Full Thickness Without Exposed Support Structures Wound Margin: Distinct, outline attached Exudate Amount: Large Exudate Type: Purulent Exudate Color: yellow, brown, green Foul Odor After Cleansing: No Slough/Fibrino Yes Wound Bed Granulation Amount: Medium (34-66%) Exposed Structure Granulation Quality: Red Fascia Exposed: No Necrotic Amount: Medium (34-66%) Fat Layer (Subcutaneous Tissue) Exposed: Yes Necrotic Quality: Adherent Slough Tendon Exposed: No Muscle Exposed: No Joint Exposed: No Bone Exposed: No Electronic Signature(s) Signed: 10/31/2020 5:51:35 PM By: Antonieta Iba Entered By: Antonieta Iba on 10/31/2020 15:31:58 -------------------------------------------------------------------------------- Wound Assessment Details Patient Name: Date of Service: Shawn Bering T. 10/31/2020 2:15 PM Medical Record Number: [id number] Patient Account Number: [id number] Date of Birth/Sex: Treating RN: 03/05/1981 (40 y.o. [name] Primary Care Odile Veloso: Marcy Siren Other Clinician: Referring Ceferino Lang: Treating Lezley Bedgood/Extender: [name] in Treatment: 6 Wound Status Wound Number: [id number] Primary Lymphedema Etiology: Wound Location: Right, Lateral Lower Leg Wound Healed - Epithelialized Wounding Event: Gradually Appeared Status: Date Acquired: 07/10/2020 Comorbid Lymphedema, Deep Vein Thrombosis, Hypertension, Peripheral Weeks Of Treatment: 6 History: Venous Disease, Type II Diabetes Clustered Wound: Yes Wound Measurements Length: (cm) Width: (cm) Depth: (cm) Area: (cm) Volume: (cm) 0 % Reduction in Area: 100% 0 % Reduction in Volume: 100% 0 0 0 Wound Description Classification: Full Thickness Without Exposed Support Structur es Electronic Signature(s) Signed: 10/31/2020 5:51:35 PM By: Antonieta Iba Entered By: Antonieta Iba on 10/31/2020 15:32:34 -------------------------------------------------------------------------------- Vitals Details Patient Name: Date of Service: [name], [name] T. 10/31/2020 2:15 PM Medical Record Number: [id number] Patient Account Number: [id number] Date of Birth/Sex: Treating RN: May 11, 1980 (40 y.o. [name] Primary Care Freada Twersky: Marcy Siren Other Clinician: Referring Samit Sylve: Treating Davide Risdon/Extender: [name] in Treatment: 6 Vital Signs Time Taken: 15:21 Temperature (F): 98.3 Height (in): 74 Pulse (bpm): 107 Weight (lbs): 425 Respiratory Rate (breaths/min): 18 Body Mass Index (BMI): 54.6 Blood Pressure (mmHg): 132/81 Reference  Range: 80 - 120 mg / dl Electronic Signature(s) Signed: 10/31/2020 5:51:35 PM By: Antonieta Iba Entered By: Antonieta Iba on 10/31/2020 15:22:45

## 2020-10-31 NOTE — Telephone Encounter (Signed)
-----   Message from Alver Sorrow, NP sent at 10/31/2020  2:45 PM EDT ----- Normal magnesium and thyroid function. CBC with no evidence of anemia nor infection. Kidney function worse than baseline. Suggestive of dehydration. Recommend stopping Metolazone. Continue Lasix, Spironolactone, Potassium, and Lisinopril. Recommend repeat BMP in 1 week at [facility].

## 2020-11-01 ENCOUNTER — Other Ambulatory Visit: Payer: Self-pay

## 2020-11-01 DIAGNOSIS — I872 Venous insufficiency (chronic) (peripheral): Secondary | ICD-10-CM

## 2020-11-01 NOTE — Progress Notes (Signed)
Marrocco, Montford T ([id number]) . Visit Report for 10/31/2020 Debridement Details Patient Name: Date of Service: [name] 10/31/2020 2:15 PM Medical Record Number: [id number] Patient Account Number: [id number] Date of Birth/Sex: Treating RN: [birth year] (40 y.o. [name], [name] Primary Care Provider: Marcy [name] Other Clinician: Referring Provider: Treating Provider/Extender: [name] in Treatment: 6 Debridement Performed for Assessment: Wound #32 Right,Medial Lower Leg Performed By: Physician [name]., MD Debridement Type: Debridement Level of Consciousness (Pre-procedure): Awake and Alert Pre-procedure Verification/Time Out Yes - 16:28 Taken: Start Time: 16:28 Pain Control: Lidocaine T Area Debrided (L x W): otal 5 (cm) x 5 (cm) = 25 (cm) Tissue and other material debrided: Viable, Non-Viable, Slough, Subcutaneous, Skin: Dermis , Skin: Epidermis, Slough Level: Skin/Subcutaneous Tissue Debridement Description: Excisional Instrument: Curette Bleeding: Minimum Hemostasis Achieved: Pressure End Time: 16:28 Procedural Pain: 0 Post Procedural Pain: 0 Response to Treatment: Procedure was tolerated well Level of Consciousness (Post- Awake and Alert procedure): Post Debridement Measurements of Total Wound Length: (cm) 7.2 Width: (cm) 21 Depth: (cm) 0.3 Volume: (cm) 35.626 Character of Wound/Ulcer Post Debridement: Improved Post Procedure Diagnosis Same as Pre-procedure Electronic Signature(s) Signed: 11/01/2020 8:02:04 AM By: Shawn Najjar MD Signed: 11/01/2020 5:14:18 PM By: Shawn Mu RN Previous Signature: 10/31/2020 5:31:05 PM Version By: Shawn Mu RN Entered By: [name] on 11/01/2020 07:32:29 -------------------------------------------------------------------------------- HPI Details Patient Name: Date of Service: [name], Shawn Oppenheim T. 10/31/2020 2:15 PM Medical Record Number:  [id number] Patient Account Number: [id number] Date of Birth/Sex: Treating RN: 04-01-1980 (40 y.o. [name] Primary Care Provider: Marcy [name] Other Clinician: Referring Provider: Treating Provider/Extender: [name] in Treatment: 6 History of Present Illness HPI Description: The patient is a 40 yrs old bm here for evaluation of his right leg ulcer. He has an extensive history of lymphedema and ulcers. He is being treated at the [name] by [name] with [name] and the [name]. He has pumps at home but he is only using them once a day. 08/30/14 selective debridement done of surface eschar. The wound cleans up quite nicely in the bed of this looks healthy. He is using a wound VAC under an Unna wrap. 11/17/14; selective debridement done of surface eschar. Once again the wound beds at clean up quite nicely. He is no longer using a wound VAC. Recent dressing changes include Hydrofera Blue. Apparently he has done a wide variety of different topical dressings including advanced treatment options like Apligraf's without success. 03/21/15; surgical debridement done of surface eschar and nonviable subcutaneous tissue. The area on the medial aspect of the right leg has closed and the wound on the lateral right leg looks improved has been using Silver Collagen 04/11/15; I think attendance here is sporadic. The area on the right medial leg remains healed. He has a new tiny area on the back of the right leg. Again a surgical debridement of the surface eschar and nonviable subcutaneous tissue of the major wound on the right lateral leg. He has been using Silver Collagen and at home, he does his own Unna wraps at home edema control seems reasonable. 04/20/15; the area on the right medial leg has a very superficial area which may not even be open nevertheless I felt needed to be dressed today [this is his original chronic wound] the new wound is on the right anterior  lateral leg is underwent a surgical debridement. He has a small wound on the right posterior leg.  He puts on his own Unna [name], according to our nurses he does this fairly well. We have been using Silver collagen. 05/02/2015 -- I understand in the past his venous studies have been done at [facility] and he was advised weight loss before they would attempt to tackle his iliac vein blockage. He has not gone back for review. 06/27/2015 -- we have applied for Apligraf and are awaiting his insurance clearance. 07/25/2015 -- he still has to use her back from his insurance agent regarding his copayment for his Apligraf. 07/31/2015 -- the patient got a reply from the insurance agent regarding the dollar payment for his application but is not sure whether it is for 5 or for one. 11/28/2015 -- has been hurting a little bit more and he has had change in color of his wound especially on the medial part. 12/05/2015 -- his culture was positive for Proteus mirabilis and K Oxytoca which are sensitive to ciprofloxacin which he is already on. 10/3/17still on ciprofloxacin. His mother reports of dressing had to be changed last Friday due to odor. He has not been systemically unwell 12/19/15; patient came in today complaining of increasing pain and tightness in his upper right thigh. He completed the ciprofloxacin 2 or 3 days ago. He is not running a fever today. 12/26/2015 -- last week he had been seen by [name] who got a lower extremity venous duplex evaluation done which did not show DVT or SVT in the right lower extremity. he had also put him on Augmentin in addition to the previous ciprofloxacin and he had received for 2 weeks 01/02/2016 -- -- was admitted to the [name] on 12/26/2015 and discharged on 12/29/2015 and was treated for cellulitis. He was also newly diagnosed with type 2 diabetes mellitus and outpatient monitoring and initiation of treatment was recommended. Patients hemoglobin A1c was 6.6 No  osteomyelitis detected on x-rays and recent Doppler study was negative for DVT Oral doxycycline and Levaquin were recommended for the patient as an . outpatient for a 14 day treatment. IV Zosyn and vancomycin was given due to concerns of Pseudomonas infection while he was in [name]. 02/13/2016 -- he has not gone back to [name] where he had his vascular opinion earlier and I have urged him to regroup with them to see if there is any surgical options available. 02/27/2016 -- has an appointment to see [facility] vascular surgeons on January 3 and may be seeing Dr. Verdie Drown 03/19/2016 -- I was not able to find any notes on Epic but the patient did go to the vascular clinic at [facility] and saw the PA to Dr. Verdie Drown. I understand she has recommended a CT scan and MRI to be done on February 1 and they would review this with him on the same day. 04/09/2016 -- was admitted to the [name] on 03/20/2016 acute respiratory failure with hypoxia, abdominal discomfort with erythema, hypertensive urgency and chronic wound of the right leg with morbid obesity. he was discharged home on 04/05/2016 and was to continue on IV antibiotics for 18 more days follow-up with the wound center and continue with his cardiologist. he has lost approximately 130 pounds and diuresed over 48 L. He was treated with IV Zosyn and ID recommended he continue this for 3 weeks more. The notes from [facility] wound clinic were noted where he was seen by the PA and she had taken cultures which grew MSSA and Pseudomonas. She had recommended edema control  with a 4-layer compression and also referred him to the lymphedema clinic. A CT venogram was also planned for the future. 04/16/2016 -- at [facility] a CT pelvic venogram runoff was done on 04/11/2016 -- IMPRESSION:-Limited study secondary to poor opacification of venous structures. No definite evidence of acute deep vein thrombosis. 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 -Extensive right iliac chain and right inguinal adenopathy, favored to be reactive/secondary to congestion. The patient continues on his IV antibiotics through his PICC line and has his cardiology opinion and also a bariatric surgery opinion coming up. 04/30/2016 -- He has completed his IV antibiotics through his PICC line. 05/14/2016 --he was seen by the PA, Ms Sluss, recommended alternate day dressing with compression and use Hibiclens and Dakin's solution with acetic acid on his wounds. 07/30/2016 -- he has still not got his custom-made compression stockings and I have urged him to go and get him self measured for these. 08/06/2016 -- he has been measured for his custom stocking and will get in 2 weeks. He has lost 20 pounds since March 08/27/2016 -- he has not got his custom stockings yet and he tried another pair which has not fit well and he has had a lot of maceration increase the size of his wound. 09/03/2016 -- the patient says that he has not been very compliant with his dressing changes and his elevation and exercise and this week he is notices wound get rather large with maceration. 09/18/16; according to our intake nurse the measurements on this patient's wound are slightly larger. I note previous compliance concerns. I note that his wounds measured larger last week which was noted by Dr. Meyer Russel. Culture done last week was negative. 10/01/2016 -- the patient has received some lymphedema pumps which are new and he says he is being compliant with this. He is going to be away for 2 weeks on vacation to [facility] 10/15/2016 -- he returns after 2 weeks and tells me he has been diligent with his dressing and has lost 25 pounds over the last 3 months. 10/22/2016 -- his last hemoglobin A1c was 6.2 and he is now diet controlled. 11/19/2016 -- he has been having problems with his compression stockings  which are custom made at [name]. He has not yet been able to get them. He had taken out his compression because he had gone there and had no dressing on when he came here today READMISSION 05/22/2018 [name] is a now 40 year old man with a Berkowitz history of wounds in his lower extremities secondary to chronic venous insufficiency with secondary lymphedema. He has had previous vein ligations on the right although I do not have this information in front of me. As I understand things he also has central venous obstruction with a vein bypass in 2005 I believe this was done at [facility]. He tells me over the last 2 months he has noted mid reopening in the right mid anterior lower extremity. This is a rectangular shaped wound which is kind of odd in terms of how that formed. He has been using silver alginate and Unna [name] that we used on him when he was here in 2018. He buys his product supply online thinks this is cheaper than ordering through intermediary's Past medical history; CHF, morbid obesity, hypertension, hyperlipidemia, chronic venous insufficiency, vein bypass in 2005 previous vein ablations or ligations. He has a stent in the right lower extremity.  ABI in this clinic was 1.04. He is using his compression pumps at home 3/27; 2-week follow-up. Right lower extremity wounds related to chronic venous insufficiency and lymphedema. He has been using silver alginate under an Radio broadcast assistantUnna boot. He change the Foot LockerUnna boot himself at home last week. He is making nice progress 4/10; 2-week follow-up. Right lower extremity wound is just about closed a small open area in the middle of the and large rectangular wound he had at the beginning. His edema control is excellent. He says he is using his compression pumps religiously 4/17; 2-week follow-up. Right lower extremity wound is totally closed. He had a large rectangular wound which is progressively closed. His edema control is very good. He is using his  compression pumps once to twice a day READMISSION 09/19/2020 [name] is now a 40 year old man we have had for several stays in this clinic including 2017, 2018 and most recently in 2020 with a wound on his right lower leg. He has compression pumps which he claims to be using twice a day. He also has stockings however I am not sure how much he uses them. Things have broken down over the last month or 2 he has an extensive wound area on the right mid tibial area I think this is similar to what he has had in the past. He has been using Xeroform and an Ace wrap that his girlfriend is applying. Past medical history; the patient has known chronic venous insufficiency with secondary lymphedema. He had an iliac vein blockage and he [facility] although the patient is not followed up with them. The surgeon may have been Dr. Jacolyn ReedyMarston. As noted he has lymphedema pumps. Type 2 diabetes congestive heart failure obstructive sleep apnea. He has a history of a right leg DVT although this may have been the iliac vein he is not currently on anticoagulation ABI in our clinic is 1.37 on the right Imaging Results - in this encounter CT Pelvic Venogram Run Off Imaging Results - CT Pelvic Venogram Run Off Impressions Performed At -Limited study secondary to poor opacification of venous structures. Nodefinite evidence of acute deep vein thrombosis. 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 -Extensive right iliac chain and right inguinal adenopathy, favored to bereactive/secondary to congestion. [facility] Imaging Results - CT Pelvic Venogram Run Off Narrative Performed At EXAM: CT PELVIC VENOGRAM RUN OFF DATE: 04/11/2016 1:28 PM ACCESSION: [id number] UN DICTATED: 04/11/2016 2:18 PM INTERPRETATION LOCATION: Main [facility] CLINICAL INDICATION: 40 years old Male with h/o RLE DVT and massive lymphedema of both LE's. Eval  for venous outflow obstruction vs extrinsic mass- L97.203-Skin ulcer of calf with necrosis of muscle, unspecified laterality(RAF-HCC) COMPARISON: CT abdomen pelvis dated 02/11/2007 TECHNIQUE: A spiral CT scan was obtained approximately 3 minutes after administration of IV contrast from the kidneys to the knees.Images were reconstructed in the axial plane. Multiplanar reformatted and MIP images were provided for further evaluation of the vessels. For selected cases, 3D volumerendered images are also provided. VASCULAR FINDINGS: There is overall poor contrast opacification of the venous structures, limitingevaluation. IVC: No evidence of thrombus. Iliac veins: The right iliac veins are chronically occluded/diminutive in size. There are extensive venous collaterals noted throughout the right pelvis extending into the right lower extremity. The number and caliber of the venous collaterals have increased when compared to 02/11/2007. The left iliac veins appear patent. There are also small caliber venous collaterals within the  left pelvis extending into the left lower extremity. Anterior abdominal wall venouscollaterals are also noted but fully imaged secondary to patient diameter. The bilateral femoral and popliteal veins appear patent. There is no evidence of acute thrombus. The arterial vasculature is unremarkable on this venous phase time study. NONVASCULAR FINDINGS: LOWER CHEST Unremarkable. : ABDOMEN: HEPATOBILIARY: Unremarkable liver. No biliary ductal dilatation. Gallbladder isremarkable. PANCREAS: Unremarkable. SPLEEN: Unremarkable. ADRENAL GLANDS: Unremarkable. KIDNEYS/URETERS: Unremarkable. BLADDER: Unremarkable. BOWEL/PERITONEUM/RETROPERITONEUM: No bowel obstruction. No acute inflammatoryprocess. No ascites. LYMPH NODES: Extensive right iliac chain and right inguinal adenopathy. A nodal conglomerate in the right inguinal region measures 6.8 x 3.4 cm (2:124). This is only slightly  larger when compared to 02/11/2007. Given interval stabilityover 10 years, these are favored to be reactive/secondary to congestion. REPRODUCTIVE: Unremarkable. BONES/SOFT TISSUES: No acute osseous abnormalities. Postsurgical changes in the right inguinal and left medial thigh region. Right greater than left softtissue edema throughout the lower extremities. [facility] Imaging Results - CT Pelvic Venogram Run Off Procedure Note Interface, Rad Results In - 04/11/2016 3:28 PM EST EXAM: CT PELVIC VENOGRAM RUN OFF DATE: 04/11/2016 1:28 PM ACCESSION: [id number] UN DICTATED: 04/11/2016 2:18 PM INTERPRETATION LOCATION: Main [facility] CLINICAL INDICATION: 40 years old Male with h/o RLE DVT and massive lymphedema of both LE's. Eval for venous outflow obstruction vs extrinsic mass- L97.203-Skin ulcer of calf with necrosis of muscle, unspecified laterality (RAF-HCC) COMPARISON: CT abdomen pelvis dated 02/11/2007 TECHNIQUE: A spiral CT scan was obtained approximately 3 minutes after administration of IV contrast from the kidneys to the knees. Images were reconstructed in the axial plane. Multiplanar reformatted and MIP images were provided for further evaluation of the vessels. For selected cases, 3D volume rendered images are also provided. VASCULAR FINDINGS: There is overall poor contrast opacification of the venous structures, limiting evaluation. IVC: No evidence of thrombus. Iliac veins: The right iliac veins are chronically occluded/diminutive in size. There are extensive venous collaterals noted throughout the right pelvis extending into the right lower extremity. The number and caliber of the venous collaterals have increased when compared to 02/11/2007. The left iliac veins appear patent. There are also small caliber venous collaterals within the left pelvis extending into the left lower extremity. Anterior abdominal wall venous collaterals are also noted but fully imaged secondary to patient diameter. The  bilateral femoral and popliteal veins appear patent. There is no evidence of acute thrombus. The arterial vasculature is unremarkable on this venous phase time study. NONVASCULAR FINDINGS: LOWER CHEST Unremarkable. : ABDOMEN: HEPATOBILIARY: Unremarkable liver. No biliary ductal dilatation. Gallbladder is remarkable. PANCREAS: Unremarkable. SPLEEN: Unremarkable. ADRENAL GLANDS: Unremarkable. KIDNEYS/URETERS: Unremarkable. BLADDER: Unremarkable. BOWEL/PERITONEUM/RETROPERITONEUM: No bowel obstruction. No acute inflammatory process. No ascites. LYMPH NODES: Extensive right iliac chain and right inguinal adenopathy. A nodal conglomerate in the right inguinal region measures 6.8 x 3.4 cm (2:124). This is only slightly larger when compared to 02/11/2007. Given interval stability over 10 years, these are favored to be reactive/secondary to congestion. REPRODUCTIVE: Unremarkable. BONES/SOFT TISSUES: No acute osseous abnormalities. Postsurgical changes in the right inguinal and left medial thigh region. Right greater than left soft tissue edema throughout the lower extremities. IMPRESSION: -Limited study secondary to poor opacification of venous structures. No definite evidence of acute deep vein thrombosis. 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 -Extensive right iliac chain and right inguinal adenopathy, favored to be reactive/secondary to congestion. Imaging Results - CT Pelvic Venogram Run Off Performing Organization Address City/State/Zipcode Phone Number [name]  [facility] [address] [place], [place] [address] Surgical summary as listed below; Assessment: [name] is a 40 y.o. male with Thayne history of right lower extremity chronic obstructive deep venous disease with recurrent ulceration, absence of right iliac vein, prior left to right femoral vein bypass. Patient continues with recurrent ulceration,  his weight precludes him from further vascular studies at this time. If he is able to lose 50-100 pounds we would be able to do left lower extremity venogram possible intervention with 50% chance of re- opening occlusion per Dr. Jacolyn Reedy. For global health, weight loss, potential improvement in his chronic venous insufficiency would appreciateinput from bariatric surgery group for recommendations. 09/26/20; patient comes in today with not much improvement. He has also some odor. 3 open areas and this linear area in his mid calf require debridement. We have been using silver alginate under compression. I gave him Keflex last week for what I thought was cellulitis in the right medial thigh he is completing this and I think this is better. 7/26; patient comes in today with again necrotic debris over these wounds. According to our intake nurse extreme malodor and drainage. We have been using silver alginate under compression. He is complaining about pain in the wound area. 7/29; Patient presents for follow-up. He reports improvement to the wound. He has finished taking Keflex. He has no issues or complaints today. 10/17/2020 upon evaluation today patient appears to be doing poorly in regard to his leg ulcer. He has been tolerating the dressing changes without complication. Fortunately there does not appear to be any signs of active infection at this time. No fevers, chills, nausea, vomiting, or diarrhea. 8/23; the patient has 2 areas anteriorly and a larger area medially on the right lower leg. He comes in today with his compression wrap slipping down. We do not have good edema control. We have been using silver alginate. I believe he completed a course of antibioticso Levaquin given to him 2 weeks ago at his last visit. He states his girlfriend who is a nurse changes his dressings. We have not seen this in 2 weeks. He just received a compounded topical antibiotic based on I believe a deep tissue culture that  was done. I do not have a result of the deep tissue culture today and he forgot the compounded antibiotic. I will try to clarify this next time I tried to send him back to vascular at [facility] for review of his central venous obstruction although they would not even see him I think because of noncompliance, or at least perceived noncompliance with regards to the recommendations for weight loss, consideration of bariatric assessment for surgery etc. Electronic Signature(s) Signed: 11/01/2020 8:02:04 AM By: Shawn Najjar MD Entered By: [name] on 11/01/2020 07:37:53 -------------------------------------------------------------------------------- Physical Exam Details Patient Name: Date of Service: Shawn Bering T. 10/31/2020 2:15 PM Medical Record Number: [id number] Patient Account Number: [id number] Date of Birth/Sex: Treating RN: [birth year] (40 y.o. [name] Primary Care Provider: Marcy [name] Other Clinician: Referring Provider: Treating Provider/Extender: [name] in Treatment: 6 Constitutional Sitting or standing Blood Pressure is within target range for patient.. Pulse regular and within target range for patient.[name] Respirations regular, non-labored and within target range.. Temperature is normal and within the target range for the patient.[name] Appears in no distress. Cardiovascular Both dorsalis pedis posterior tibial pulses are palpable.. Edema control is better certainly not perfect in the right leg nonpitting no evidence of cellulitis or DVT. Notes  Wound exam; he has 2 wounds anteriorly in close juxtaposition also an area medially I think these were part of an original connected wound at 1 point. The surfaces of these under illumination are all about the same. Completely/100% necrotic material on the surface. I used an open curette to aggressively remove this. Electronic Signature(s) Signed: 11/01/2020 8:02:04 AM By: Shawn Najjar  MD Entered By: [name] on 11/01/2020 07:39:48 -------------------------------------------------------------------------------- Physician Orders Details Patient Name: Date of Service: [name], Shawn Oppenheim T. 10/31/2020 2:15 PM Medical Record Number: [id number] Patient Account Number: [id number] Date of Birth/Sex: Treating RN: 07/15/1980 (40 y.o. [name], [name] Primary Care Provider: Marcy [name] Other Clinician: Referring Provider: Treating Provider/Extender: [name] in Treatment: 6 Verbal / Phone Orders: No Diagnosis Coding Follow-up Appointments ppointment in 1 week. - [name] Return A Bathing/ Shower/ Hygiene May shower with protection but do not get wound dressing(s) wet. - May use a cast wrap to put over wraps to shower; you can buy these at [facility] or [facility] Edema Control - Lymphedema / SCD / Other Lymphedema Pumps. Use Lymphedema pumps on leg(s) 2-3 times a day for 45-60 minutes. If wearing any wraps or hose, do not remove them. Continue exercising as instructed. Elevate legs to the level of the heart or above for 30 minutes daily and/or when sitting, a frequency of: Avoid standing for K[place]ler periods of time. Patient to wear own compression stockings every day. - on Left Leg Wound Treatment Wound #32 - Lower Leg Wound Laterality: Right, Medial Cleanser: Soap and Water 2 x Per Week/15 Days Discharge Instructions: May shower and wash wound with dial antibacterial soap and water prior to dressing change. Cleanser: Wound Cleanser (DME) (Generic) 2 x Per Week/15 Days Discharge Instructions: Cleanse the wound with wound cleanser prior to applying a clean dressing using gauze sponges, not tissue or cotton balls. Peri-Wound Care: Triamcinolone 15 (g) 2 x Per Week/15 Days Discharge Instructions: Use triamcinolone 15 (g) as directed Peri-Wound Care: Sween Lotion (Moisturizing lotion) 2 x Per Week/15 Days Discharge Instructions: Apply  moisturizing lotion as directed Prim Dressing: KerraCel Ag Gelling Fiber Dressing, 4x5 in (silver alginate) (DME) (Generic) 2 x Per Week/15 Days ary Discharge Instructions: Apply silver alginate to wound bed as instructed Secondary Dressing: Woven Gauze Sponge, Non-Sterile 4x4 in (DME) (Generic) 2 x Per Week/15 Days Discharge Instructions: Apply over primary dressing as directed. Secondary Dressing: ABD Pad, 5x9 (DME) (Generic) 2 x Per Week/15 Days Discharge Instructions: Apply over primary dressing as directed. Secondary Dressing: Zetuvit Plus 4x8 in (DME) (Generic) 2 x Per Week/15 Days Discharge Instructions: Apply over primary dressing as directed. Secondary Dressing: CarboFLEX Odor Control Dressing, 4x4 in (DME) (Generic) 2 x Per Week/15 Days Discharge Instructions: Apply over primary dressing as directed. Compression Wrap: FourPress (4 layer compression wrap) (DME) (Generic) 2 x Per Week/15 Days Discharge Instructions: Apply four layer compression as directed. May also use Miliken CoFlex 2 layer compression system as alternative. Electronic Signature(s) Signed: 10/31/2020 5:31:05 PM By: Shawn Mu RN Signed: 11/01/2020 8:02:04 AM By: Shawn Najjar MD Entered By: [name] on 10/31/2020 17:10:47 -------------------------------------------------------------------------------- Problem List Details Patient Name: Date of Service: [name], Theodosia Quay 10/31/2020 2:15 PM Medical Record Number: [id number] Patient Account Number: [id number] Date of Birth/Sex: Treating RN: 02-Nov-1980 (40 y.o. [name] Primary Care Provider: Marcy [name] Other Clinician: Referring Provider: Treating Provider/Extender: [name] in Treatment: 6 Active Problems ICD-10 Encounter Code Description Active Date MDM Diagnosis  Z61.096 Chronic venous hypertension (idiopathic) with ulcer and inflammation of right 09/19/2020 No Yes lower extremity I89.0  Lymphedema, not elsewhere classified 09/19/2020 No Yes L97.818 Non-pressure chronic ulcer of other part of right lower leg with other specified 09/19/2020 No Yes severity Inactive Problems Resolved Problems Electronic Signature(s) Signed: 11/01/2020 8:02:04 AM By: Shawn Najjar MD Entered By: [name] on 11/01/2020 07:32:05 -------------------------------------------------------------------------------- Progress Note Details Patient Name: Date of Service: Shawn Bering T. 10/31/2020 2:15 PM Medical Record Number: [id number] Patient Account Number: [id number] Date of Birth/Sex: Treating RN: [birth year] (40 y.o. [name] Primary Care Provider: Marcy [name] Other Clinician: Referring Provider: Treating Provider/Extender: [name] in Treatment: 6 Subjective History of Present Illness (HPI) The patient is a 40 yrs old bm here for evaluation of his right leg ulcer. He has an extensive history of lymphedema and ulcers. He is being treated at the [name] by [name] with [name] and the [facility]. He has pumps at home but he is only using them once a day. 08/30/14 selective debridement done of surface eschar. The wound cleans up quite nicely in the bed of this looks healthy. He is using a wound VAC under an Unna wrap. 11/17/14; selective debridement done of surface eschar. Once again the wound beds at clean up quite nicely. He is no longer using a wound VAC. Recent dressing changes include Hydrofera Blue. Apparently he has done a wide variety of different topical dressings including advanced treatment options like Apligraf's without success. 03/21/15; surgical debridement done of surface eschar and nonviable subcutaneous tissue. The area on the medial aspect of the right leg has closed and the wound on the lateral right leg looks improved has been using Silver Collagen 04/11/15; I think attendance here is sporadic. The area on the right medial leg  remains healed. He has a new tiny area on the back of the right leg. Again a surgical debridement of the surface eschar and nonviable subcutaneous tissue of the major wound on the right lateral leg. He has been using Silver Collagen and at home, he does his own Unna wraps at home edema control seems reasonable. 04/20/15; the area on the right medial leg has a very superficial area which may not even be open nevertheless I felt needed to be dressed today [this is his original chronic wound] the new wound is on the right anterior lateral leg is underwent a surgical debridement. He has a small wound on the right posterior leg. He puts on his own Unna [name], according to our nurses he does this fairly well. We have been using Silver collagen. 05/02/2015 -- I understand in the past his venous studies have been done at [facility] and he was advised weight loss before they would attempt to tackle his iliac vein blockage. He has not gone back for review. 06/27/2015 -- we have applied for Apligraf and are awaiting his insurance clearance. 07/25/2015 -- he still has to use her back from his insurance agent regarding his copayment for his Apligraf. 07/31/2015 -- the patient got a reply from the insurance agent regarding the dollar payment for his application but is not sure whether it is for 5 or for one. 11/28/2015 -- has been hurting a little bit more and he has had change in color of his wound especially on the medial part. 12/05/2015 -- his culture was positive for Proteus mirabilis and K Oxytoca which are sensitive to ciprofloxacin which he is already on. 12/12/15oostill on  ciprofloxacin. His mother reports of dressing had to be changed last Friday due to odor. He has not been systemically unwell 12/19/15; patient came in today complaining of increasing pain and tightness in his upper right thigh. He completed the ciprofloxacin 2 or 3 days ago. He is not running a fever today. 12/26/2015 -- last week he had  been seen by [name] who got a lower extremity venous duplex evaluation done which did not show DVT or SVT in the right lower extremity. he had also put him on Augmentin in addition to the previous ciprofloxacin and he had received for 2 weeks 01/02/2016 -- -- was admitted to the [name] on 12/26/2015 and discharged on 12/29/2015 and was treated for cellulitis. He was also newly diagnosed with type 2 diabetes mellitus and outpatient monitoring and initiation of treatment was recommended. Patientoos hemoglobin A1c was 6.6 No osteomyelitis detected on x-rays and recent Doppler study was negative for DVT Oral doxycycline and Levaquin were recommended for the patient as an . outpatient for a 14 day treatment. IV Zosyn and vancomycin was given due to concerns of Pseudomonas infection while he was in [name]. 02/13/2016 -- he has not gone back to [facility] where he had his vascular opinion earlier and I have urged him to regroup with them to see if there is any surgical options available. 02/27/2016 -- has an appointment to see [name] vascular surgeons on January 3 and may be seeing Dr. Verdie Drown 03/19/2016 -- I was not able to find any notes on Epic but the patient did go to the vascular clinic at [facility] and saw the PA to Dr. Verdie Drown. I understand she has recommended a CT scan and MRI to be done on February 1 and they would review this with him on the same day. 04/09/2016 -- was admitted to the [name] on 03/20/2016 acute respiratory failure with hypoxia, abdominal discomfort with erythema, hypertensive urgency and chronic wound of the right leg with morbid obesity. he was discharged home on 04/05/2016 and was to continue on IV antibiotics for 18 more days follow-up with the wound center and continue with his cardiologist. he has lost approximately 130 pounds and diuresed over 48 L. He was treated with IV Zosyn and ID recommended he continue this for 3 weeks  more. The notes from [name] wound clinic were noted where he was seen by the PA and she had taken cultures which grew MSSA and Pseudomonas. She had recommended edema control with a 4-layer compression and also referred him to the lymphedema clinic. A CT venogram was also planned for the future. 04/16/2016 -- at [name] a CT pelvic venogram runoff was done on 04/11/2016 -- IMPRESSION:-Limited study secondary to poor opacification of venous structures. No definite evidence of acute deep vein thrombosis. 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 -Extensive right iliac chain and right inguinal adenopathy, favored to be reactive/secondary to congestion. The patient continues on his IV antibiotics through his PICC line and has his cardiology opinion and also a bariatric surgery opinion coming up. 04/30/2016 -- He has completed his IV antibiotics through his PICC line. 05/14/2016 --he was seen by the PA, Ms Sluss, recommended alternate day dressing with compression and use Hibiclens and Dakin's solution with acetic acid on his wounds. 07/30/2016 -- he has still not got his custom-made compression stockings and I have urged him to go and get him  self measured for these. 08/06/2016 -- he has been measured for his custom stocking and will get in 2 weeks. He has lost 20 pounds since March 08/27/2016 -- he has not got his custom stockings yet and he tried another pair which has not fit well and he has had a lot of maceration increase the size of his wound. 09/03/2016 -- the patient says that he has not been very compliant with his dressing changes and his elevation and exercise and this week he is notices wound get rather large with maceration. 09/18/16; according to our intake nurse the measurements on this patient's wound are slightly larger. I note previous compliance concerns. I note that his wounds measured larger  last week which was noted by Dr. Meyer Russel. Culture done last week was negative. 10/01/2016 -- the patient has received some lymphedema pumps which are new and he says he is being compliant with this. He is going to be away for 2 weeks on vacation to [name] 10/15/2016 -- he returns after 2 weeks and tells me he has been diligent with his dressing and has lost 25 pounds over the last 3 months. 10/22/2016 -- his last hemoglobin A1c was 6.2 and he is now diet controlled. 11/19/2016 -- he has been having problems with his compression stockings which are custom made at [facility] medical. He has not yet been able to get them. He had taken out his compression because he had gone there and had no dressing on when he came here today READMISSION 05/22/2018 Mr. Anselmo is a now 40 year old man with a [place] history of wounds in his lower extremities secondary to chronic venous insufficiency with secondary lymphedema. He has had previous vein ligations on the right although I do not have this information in front of me. 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 He tells me over the last 2 months he has noted mid reopening in the right mid anterior lower extremity. This is a rectangular shaped wound which is kind of odd in terms of how that formed. He has been using silver alginate and Unna [name] that we used on him when he was here in 2018. He buys his product supply online thinks this is cheaper than ordering through intermediary's Past medical history; CHF, morbid obesity, hypertension, hyperlipidemia, chronic venous insufficiency, vein bypass in 2005 previous vein ablations or ligations. He has a stent in the right lower extremity. ABI in this clinic was 1.04. He is using his compression pumps at home 3/27; 2-week follow-up. Right lower extremity wounds related to chronic venous insufficiency and lymphedema. He has been using silver  alginate under an Radio broadcast assistant. He change the [employer] himself at home last week. He is making nice progress 4/10; 2-week follow-up. Right lower extremity wound is just about closed a small open area in the middle of the and large rectangular wound he had at the beginning. His edema control is excellent. He says he is using his compression pumps religiously 4/17; 2-week follow-up. Right lower extremity wound is totally closed. He had a large rectangular wound which is progressively closed. His edema control is very good. He is using his compression pumps once to twice a day READMISSION 09/19/2020 [name] is now a 40 year old man we have had for several stays in this clinic including 2017, 2018 and most recently in 2020 with a wound on his right lower leg. He has compression  pumps which he claims to be using twice a day. He also has stockings however I am not sure how much he uses them. Things have broken down over the last month or 2 he has an extensive wound area on the right mid tibial area I think this is similar to what he has had in the past. He has been using Xeroform and an Ace wrap that his girlfriend is applying. Past medical history; the patient has known chronic venous insufficiency with secondary lymphedema. He had an iliac vein blockage and he had a bypass at [name] although the patient is not followed up with them. The surgeon may have been Dr. Jacolyn Reedy. As noted he has lymphedema pumps. Type 2 diabetes congestive heart failure obstructive sleep apnea. He has a history of a right leg DVT although this may have been the iliac vein he is not currently on anticoagulation ABI in our clinic is 1.37 on the right Imaging Results - in this encounter CT Pelvic Venogram Run Off Imaging Results - CT Pelvic Venogram Run Off Impressions Performed At -Limited study secondary to poor opacification of venous structures. Nodefinite evidence of acute deep vein thrombosis. -Chronic occlusion of the right  iliac veins with interval increase in size andcaliber of extensive pelvic/lower extremity venous collaterals. -Extensive right iliac chain and right inguinal adenopathy, favored to bereactive/secondary to congestion. [facility] Imaging Results - CT Pelvic Venogram Run Off Narrative Performed At EXAM: CT PELVIC VENOGRAM RUN OFF DATE: 04/11/2016 1:28 PM ACCESSION: [id number] UN DICTATED: 04/11/2016 2:18 PM INTERPRETATION LOCATION: [facility] CLINICAL INDICATION: 40 years old Male with h/o RLE DVT and massive lymphedema of both LE's. Eval for venous outflow obstruction vs extrinsic mass- L97.203-Skin ulcer of calf with necrosis of muscle, unspecified laterality(RAF-HCC) COMPARISON: CT abdomen pelvis dated 02/11/2007 TECHNIQUE: A spiral CT scan was obtained approximately 3 minutes after administration of IV contrast from the kidneys to the knees. Images were reconstructed in the axial plane. Multiplanar reformatted and MIP images were provided for further evaluation of the vessels. For selected cases, 3D volumerendered images are also provided. VASCULAR FINDINGS: There is overall poor contrast opacification of the venous structures, limitingevaluation. IVC: No evidence of thrombus. Iliac veins: The right iliac veins are chronically occluded/diminutive in size. There are extensive venous collaterals noted throughout the right pelvis extending into the right lower extremity. The number and caliber of the venous collaterals have increased when compared to 02/11/2007. The left iliac veins appear patent. There are also small caliber venous collaterals within the left pelvis extending into the left lower extremity. Anterior abdominal wall venouscollaterals are also noted but fully imaged secondary to patient diameter. The bilateral femoral and popliteal veins appear patent. There is no evidence of acute thrombus. The arterial vasculature is unremarkable on this venous phase time study. NONVASCULAR  FINDINGS: LOWER CHEST Unremarkable. : ABDOMEN: HEPATOBILIARY: Unremarkable liver. No biliary ductal dilatation. Gallbladder isremarkable. PANCREAS: Unremarkable. SPLEEN: Unremarkable. ADRENAL GLANDS: Unremarkable. KIDNEYS/URETERS: Unremarkable. BLADDER: Unremarkable. BOWEL/PERITONEUM/RETROPERITONEUM: No bowel obstruction. No acute inflammatoryprocess. No ascites. LYMPH NODES: Extensive right iliac chain and right inguinal adenopathy. A nodal conglomerate in the right inguinal region measures 6.8 x 3.4 cm (2:124). This is only slightly larger when compared to 02/11/2007. Given interval stabilityover 10 years, these are favored to be reactive/secondary to congestion. REPRODUCTIVE: Unremarkable. BONES/SOFT TISSUES: No acute osseous abnormalities. Postsurgical changes in the right inguinal and left medial thigh region. Right greater than left softtissue edema throughout the lower extremities. [facility] Imaging Results - CT Pelvic Venogram Run Off  Procedure Note Interface, Rad Results In - 04/11/2016 3:28 PM EST EXAM: CT PELVIC VENOGRAM RUN OFF DATE: 04/11/2016 1:28 PM ACCESSION: [id number] UN DICTATED: 04/11/2016 2:18 PM INTERPRETATION LOCATION: [facility] CLINICAL INDICATION: 40 years old Male with h/o RLE DVT and massive lymphedema of both LE's. Eval for venous outflow obstruction vs extrinsic mass- L97.203-Skin ulcer of calf with necrosis of muscle, unspecified laterality (RAF-HCC) COMPARISON: CT abdomen pelvis dated 02/11/2007 TECHNIQUE: A spiral CT scan was obtained approximately 3 minutes after administration of IV contrast from the kidneys to the knees. Images were reconstructed in the axial plane. Multiplanar reformatted and MIP images were provided for further evaluation of the vessels. For selected cases, 3D volume rendered images are also provided. VASCULAR FINDINGS: There is overall poor contrast opacification of the venous structures, limiting evaluation. IVC: No evidence of  thrombus. Iliac veins: The right iliac veins are chronically occluded/diminutive in size. There are extensive venous collaterals noted throughout the right pelvis extending into the right lower extremity. The number and caliber of the venous collaterals have increased when compared to 02/11/2007. The left iliac veins appear patent. There are also small caliber venous collaterals within the left pelvis extending into the left lower extremity. Anterior abdominal wall venous collaterals are also noted but fully imaged secondary to patient diameter. The bilateral femoral and popliteal veins appear patent. There is no evidence of acute thrombus. The arterial vasculature is unremarkable on this venous phase time study. NONVASCULAR FINDINGS: LOWER CHEST Unremarkable. : ABDOMEN: HEPATOBILIARY: Unremarkable liver. No biliary ductal dilatation. Gallbladder is remarkable. PANCREAS: Unremarkable. SPLEEN: Unremarkable. ADRENAL GLANDS: Unremarkable. KIDNEYS/URETERS: Unremarkable. BLADDER: Unremarkable. BOWEL/PERITONEUM/RETROPERITONEUM: No bowel obstruction. No acute inflammatory process. No ascites. LYMPH NODES: Extensive right iliac chain and right inguinal adenopathy. A nodal conglomerate in the right inguinal region measures 6.8 x 3.4 cm (2:124). This is only slightly larger when compared to 02/11/2007. Given interval stability over 10 years, these are favored to be reactive/secondary to congestion. REPRODUCTIVE: Unremarkable. BONES/SOFT TISSUES: No acute osseous abnormalities. Postsurgical changes in the right inguinal and left medial thigh region. Right greater than left soft tissue edema throughout the lower extremities. IMPRESSION: -Limited study secondary to poor opacification of venous structures. No definite evidence of acute deep vein thrombosis. 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 -Extensive right iliac chain and right inguinal adenopathy, favored to be reactive/secondary to congestion. Imaging Results - CT Pelvic Venogram Run Off Performing Organization Address City/State/Zipcode Phone Number [facility] [address] [place], [place] [address] Surgical summary as listed below; Assessment: [name] is a 40 y.o. male with [place] history of right lower extremity chronic obstructive deep venous disease with recurrent ulceration, absence of right iliac vein, prior left to right femoral vein bypass. Patient continues with recurrent ulceration, his weight precludes him from further vascular studies at this time. If he is able to lose 50-100 pounds we would be able to do left lower extremity venogram possible intervention with 50% chance of re- opening occlusion per Dr. Jacolyn Reedy. For global health, weight loss, potential improvement in his chronic venous insufficiency would appreciateinput from bariatric surgery group for recommendations. 09/26/20; patient comes in today with not much improvement. He has also some odor. 3 open areas and this linear area in his mid calf require debridement. We have been using silver alginate under compression. I gave him Keflex last week for what I thought was cellulitis in the right medial thigh he is completing  this and I think this is better. 7/26; patient comes in today with again necrotic debris over these wounds. According to our intake nurse extreme malodor and drainage. We have been using silver alginate under compression. He is complaining about pain in the wound area. 7/29; Patient presents for follow-up. He reports improvement to the wound. He has finished taking Keflex. He has no issues or complaints today. 10/17/2020 upon evaluation today patient appears to be doing poorly in regard to his leg ulcer. He has been tolerating the dressing changes without complication. Fortunately there does not appear to be any signs of active infection  at this time. No fevers, chills, nausea, vomiting, or diarrhea. 8/23; the patient has 2 areas anteriorly and a larger area medially on the right lower leg. He comes in today with his compression wrap slipping down. We do not have good edema control. We have been using silver alginate. I believe he completed a course of antibioticso Levaquin given to him 2 weeks ago at his last visit. He states his girlfriend who is a nurse changes his dressings. We have not seen this in 2 weeks. He just received a compounded topical antibiotic based on I believe a deep tissue culture that was done. I do not have a result of the deep tissue culture today and he forgot the compounded antibiotic. I will try to clarify this next time I tried to send him back to vascular at [facility] for review of his central venous obstruction although they would not even see him I think because of noncompliance, or at least perceived noncompliance with regards to the recommendations for weight loss, consideration of bariatric assessment for surgery etc. Objective Constitutional Sitting or standing Blood Pressure is within target range for patient.. Pulse regular and within target range for patient.[name] Respirations regular, non-labored and within target range.. Temperature is normal and within the target range for the patient.[name] Appears in no distress. Vitals Time Taken: 3:21 PM, Height: 74 in, Weight: 425 lbs, BMI: 54.6, Temperature: 98.3 F, Pulse: 107 bpm, Respiratory Rate: 18 breaths/min, Blood Pressure: 132/81 mmHg. Cardiovascular Both dorsalis pedis posterior tibial pulses are palpable.. Edema control is better certainly not perfect in the right leg nonpitting no evidence of cellulitis or DVT. General Notes: Wound exam; he has 2 wounds anteriorly in close juxtaposition also an area medially I think these were part of an original connected wound at 1 point. The surfaces of these under illumination are all about the same. Completely/100%  necrotic material on the surface. I used an open curette to aggressively remove this. Integumentary (Hair, Skin) Wound #32 status is Open. Original cause of wound was Gradually Appeared. The date acquired was: 07/11/2020. The wound has been in treatment 6 weeks. The wound is located on the Right,Medial Lower Leg. The wound measures 7.2cm length x 21cm width x 0.3cm depth; 118.752cm^2 area and 35.626cm^3 volume. There is Fat Layer (Subcutaneous Tissue) exposed. There is no tunneling or undermining noted. There is a large amount of purulent drainage noted. The wound margin is distinct with the outline attached to the wound base. There is medium (34-66%) red granulation within the wound bed. There is a medium (34-66%) amount of necrotic tissue within the wound bed including Adherent Slough. Wound #33 status is Healed - Epithelialized. Original cause of wound was Gradually Appeared. The date acquired was: 07/10/2020. The wound has been in treatment 6 weeks. The wound is located on the Right,Lateral Lower Leg. The wound measures 0cm length x 0cm width x  0cm depth; 0cm^2 area and 0cm^3 volume. Assessment Active Problems ICD-10 Chronic venous hypertension (idiopathic) with ulcer and inflammation of right lower extremity Lymphedema, not elsewhere classified Non-pressure chronic ulcer of other part of right lower leg with other specified severity Procedures Wound #32 Pre-procedure diagnosis of Wound #32 is a Lymphedema located on the Right,Medial Lower Leg . There was a Excisional Skin/Subcutaneous Tissue Debridement with a total area of 25 sq cm performed by [name]., MD. With the following instrument(s): Curette to remove Viable and Non-Viable tissue/material. Material removed includes Subcutaneous Tissue, Slough, Skin: Dermis, and Skin: Epidermis after achieving pain control using Lidocaine. No specimens were taken. A time out was conducted at 16:28, prior to the start of the procedure. A  Minimum amount of bleeding was controlled with Pressure. The procedure was tolerated well with a pain level of 0 throughout and a pain level of 0 following the procedure. Post Debridement Measurements: 7.2cm length x 21cm width x 0.3cm depth; 35.626cm^3 volume. Character of Wound/Ulcer Post Debridement is improved. Post procedure Diagnosis Wound #32: Same as Pre-Procedure Pre-procedure diagnosis of Wound #32 is a Lymphedema located on the Right,Medial Lower Leg . There was a Four Layer Compression Therapy Procedure by Shawn Mu, RN. Post procedure Diagnosis Wound #32: Same as Pre-Procedure Plan Follow-up Appointments: Return Appointment in 1 week. - [name] Bathing/ Shower/ Hygiene: May shower with protection but do not get wound dressing(s) wet. - May use a cast wrap to put over wraps to shower; you can buy these at [facility] or [facility] Edema Control - Lymphedema / SCD / Other: Lymphedema Pumps. Use Lymphedema pumps on leg(s) 2-3 times a day for 45-60 minutes. If wearing any wraps or hose, do not remove them. Continue exercising as instructed. Elevate legs to the level of the heart or above for 30 minutes daily and/or when sitting, a frequency of: Avoid standing for Rauch periods of time. Patient to wear own compression stockings every day. - on Left Leg WOUND #32: - Lower Leg Wound Laterality: Right, Medial Cleanser: Soap and Water 2 x Per Week/15 Days Discharge Instructions: May shower and wash wound with dial antibacterial soap and water prior to dressing change. Cleanser: Wound Cleanser (DME) (Generic) 2 x Per Week/15 Days Discharge Instructions: Cleanse the wound with wound cleanser prior to applying a clean dressing using gauze sponges, not tissue or cotton balls. Peri-Wound Care: Triamcinolone 15 (g) 2 x Per Week/15 Days Discharge Instructions: Use triamcinolone 15 (g) as directed Peri-Wound Care: Sween Lotion (Moisturizing lotion) 2 x Per Week/15 Days Discharge Instructions:  Apply moisturizing lotion as directed Prim Dressing: KerraCel Ag Gelling Fiber Dressing, 4x5 in (silver alginate) (DME) (Generic) 2 x Per Week/15 Days ary Discharge Instructions: Apply silver alginate to wound bed as instructed Secondary Dressing: Woven Gauze Sponge, Non-Sterile 4x4 in (DME) (Generic) 2 x Per Week/15 Days Discharge Instructions: Apply over primary dressing as directed. Secondary Dressing: ABD Pad, 5x9 (DME) (Generic) 2 x Per Week/15 Days Discharge Instructions: Apply over primary dressing as directed. Secondary Dressing: Zetuvit Plus 4x8 in (DME) (Generic) 2 x Per Week/15 Days Discharge Instructions: Apply over primary dressing as directed. Secondary Dressing: CarboFLEX Odor Control Dressing, 4x4 in (DME) (Generic) 2 x Per Week/15 Days Discharge Instructions: Apply over primary dressing as directed. Com pression Wrap: FourPress (4 layer compression wrap) (DME) (Generic) 2 x Per Week/15 Days Discharge Instructions: Apply four layer compression as directed. May also use Miliken CoFlex 2 layer compression system as alternative. 1. This still represents a difficult  clinical situation. The patient has a [facility] however they will not see him in follow-up stating he is not a surgical or interventional candidate based I think on noncompliance. 2. The patient tolerates 4-layer compression and I think it is using his compression pumps reliably although I will need to talk to him about this 3 his wounds have a completely nonviable surface and he is going to require more frequent debridement along with topical compounded antibiotics based I think on a PCR culture that we did although I will need to research all of this. I suspect part of his nonhealing is biofilm/bioburden. 4. His girlfriend is doing his compression wraps every other week but that is only part of the issue here. I may need to bring him in weekly 5. I do  not think he has an arterial issue although technically he is noncompressible. I would be more concerned if his wounds were in his feet Electronic Signature(s) Signed: 11/01/2020 8:02:04 AM By: Shawn Najjar MD Entered By: [name] on 11/01/2020 07:43:20 -------------------------------------------------------------------------------- SuperBill Details Patient Name: Date of Service: [name] 10/31/2020 Medical Record Number: [id number] Patient Account Number: [id number] Date of Birth/Sex: Treating RN: [birth year] (40 y.o. [name], [name] Primary Care Provider: Marcy [name] Other Clinician: Referring Provider: Treating Provider/Extender: [name] in Treatment: 6 Diagnosis Coding ICD-10 Codes Code Description [phone] Chronic venous hypertension (idiopathic) with ulcer and inflammation of right lower extremity I89.0 Lymphedema, not elsewhere classified L97.818 Non-pressure chronic ulcer of other part of right lower leg with other specified severity Facility Procedures CPT4 Code: [id number] Description: 11042 - DEB SUBQ TISSUE 20 SQ CM/< ICD-10 Diagnosis Description L97.818 Non-pressure chronic ulcer of other part of right lower leg with other specified Modifier: severity Quantity: 1 CPT4 Code: [id number] Description: 11045 - DEB SUBQ TISS EA ADDL 20CM ICD-10 Diagnosis Description I87.331 Chronic venous hypertension (idiopathic) with ulcer and inflammation of right low Modifier: er extremity Quantity: 1 Physician Procedures : CPT4 Code Description Modifier [id number] 11042 - WC PHYS SUBQ TISS 20 SQ CM ICD-10 Diagnosis Description L97.818 Non-pressure chronic ulcer of other part of right lower leg with other specified severity Quantity: 1 : [id number] 11045 - WC PHYS SUBQ TISS EA ADDL 20 CM ICD-10 Diagnosis Description I87.331 Chronic venous hypertension (idiopathic) with ulcer and inflammation of right lower extremity Quantity:  1 Electronic Signature(s) Signed: 11/01/2020 8:02:04 AM By: Shawn Najjar MD Previous Signature: 10/31/2020 5:31:05 PM Version By: Shawn Mu RN Entered By: [name] on 11/01/2020 07:43:40

## 2020-11-02 ENCOUNTER — Ambulatory Visit (HOSPITAL_COMMUNITY)
Admission: RE | Admit: 2020-11-02 | Discharge: 2020-11-02 | Disposition: A | Discharge status: Home / Self Care | Payer: 59 | Source: Ambulatory Visit | Attending: Vascular Surgery | Admitting: Vascular Surgery

## 2020-11-02 ENCOUNTER — Other Ambulatory Visit: Payer: Self-pay

## 2020-11-02 ENCOUNTER — Ambulatory Visit (INDEPENDENT_AMBULATORY_CARE_PROVIDER_SITE_OTHER): Payer: 59 | Admitting: Vascular Surgery

## 2020-11-02 ENCOUNTER — Encounter: Payer: Self-pay | Admitting: Vascular Surgery

## 2020-11-02 VITALS — BP 138/90 | HR 88 | Temp 98.1°F | Resp 22 | Ht 74.0 in | Wt >= 6400 oz

## 2020-11-02 DIAGNOSIS — I872 Venous insufficiency (chronic) (peripheral): Secondary | ICD-10-CM | POA: Insufficient documentation

## 2020-11-02 DIAGNOSIS — I83019 Varicose veins of right lower extremity with ulcer of unspecified site: Secondary | ICD-10-CM

## 2020-11-02 DIAGNOSIS — L97919 Non-pressure chronic ulcer of unspecified part of right lower leg with unspecified severity: Secondary | ICD-10-CM | POA: Diagnosis not present

## 2020-11-02 NOTE — Progress Notes (Signed)
ASSESSMENT & PLAN   CHRONIC VENOUS INSUFFICIENCY: This patient has CEAP C6 venous disease.  We have discussed the importance of intermittent leg elevation and the proper positioning for this.  We will continue to do aggressive wound care with wound care center and compression therapy.  I have discussed with the importance of of exercise.  I have encouraged him to avoid prolonged sitting and standing.  We also have discussed the importance of maintaining a healthy weight as central obesity especially increases lower extremity venous pressure.  His BMI is over 60 so certainly this is a huge component of the problem.  Based on his duplex scan today he is not a candidate for any further venous procedures on the right.  I think he should have formal arterial testing although I cannot palpate a dorsalis pedis pulse and he has biphasic signals on my exam.  I plan on seeing him back in 3 months and will obtain arterial Doppler studies at that time.  If there has been no improvement in his wounds and certainly we could consider arteriography to be sure there is nothing from an arterial standpoint that would facilitate healing.  However based on his exam he should have adequate circulation for healing.  REASON FOR CONSULT:    Venous ulcer.  The consult is requested by Dr. Baltazar Najjar.  HPI:   [name] is a 40 y.o. male who was referred by the wound care center with a venous ulcer of the right leg.  I have reviewed the records from the referring office.  The patient was seen on 10/31/2020.  At that point the wound had been treated for 6 weeks.  He has a wound to the right medial malleolus.  On my history, the patient has had a wound intermittently in the right leg for 15 to 20 years.  He has been followed in the wound care center since January.  They have been doing compression therapy and aggressive debridement.  Of note he has undergone previous ligation and stripping of the right great saphenous vein.   Has had multiple previous DVTs in the right leg he tells me.  The most recent one was over 10 years ago.  When the ulcer had healed last time he had been doing water aerobics which was very helpful.  He does have some pain associated with the ulcer but no significant aching pain when he is on his feet.  He is on the Levaquin.  I do not get any history of claudication though I think his activity is fairly limited.  He denies any history of rest pain.  He does have a old venous lymphatic pump but is working on getting a new one through the wound care center.  Past Medical History:  Diagnosis Date   Chronic venous insufficiency    a. as a premature infant, required venous access for care with subsequent DVT and some sort of procedure -> eventually went on to have vein bypass around 2005.   Hypertension    Lymphedema    Morbid obesity (HCC)    OSA on CPAP     Family History  Problem Relation Age of Onset   Diabetes Mother    Cancer Father        colon   Cancer Maternal Grandmother    Cancer Maternal Grandfather     SOCIAL HISTORY: Social History   Tobacco Use   Smoking status: Never   Smokeless tobacco: Never  Substance Use Topics  Alcohol use: Yes    Alcohol/week: 4.0 standard drinks    Types: 4 Standard drinks or equivalent per week    Comment: liquor - pt states once a week    Allergies  Allergen Reactions   Lidocaine Other (See Comments)    "burns my skin"   Peanut-Containing Drug Products Swelling    Tree nuts    Current Outpatient Medications  Medication Sig Dispense Refill   Accu-Chek Softclix Lancets lancets Use as instructed 100 each 12   carvedilol (COREG) 12.5 MG tablet Take 1 tablet (12.5 mg total) by mouth 2 (two) times daily. 60 tablet 2   furosemide (LASIX) 80 MG tablet Take 1 tablet (80 mg total) by mouth 2 (two) times daily. 60 tablet 2   gabapentin (NEURONTIN) 300 MG capsule Take 1 capsule (300 mg total) by mouth at bedtime as needed. 30 capsule 0    glucose blood (ACCU-CHEK AVIVA PLUS) test strip Use as instructed 100 each 12   levofloxacin (LEVAQUIN) 750 MG tablet Take 750 mg by mouth daily.     lisinopril (ZESTRIL) 40 MG tablet Take 1 tablet (40 mg total) by mouth daily. 30 tablet 2   meloxicam (MOBIC) 15 MG tablet Take 1 tablet (15 mg total) by mouth daily. 30 tablet 0   metFORMIN (GLUCOPHAGE) 500 MG tablet Take 1 tablet (500 mg total) by mouth daily with breakfast. 90 tablet 0   metolazone (ZAROXOLYN) 2.5 MG tablet Take one tablet 30 minutes prior to your morning Furosemide (Lasix) on Saturdays and Tuesday or as directed by cardiology 30 tablet 0   Misc. Devices (SCD SOFT SLEEVES/KNEE LENGTH) MISC 2 each by Does not apply route as needed. 2 each 0   potassium chloride SA (KLOR-CON) 20 MEQ tablet Take 2 tablets (40 mEq total) by mouth 2 (two) times daily. 360 tablet 0   spironolactone (ALDACTONE) 25 MG tablet Take 1 tablet (25 mg total) by mouth daily. 90 tablet 0   traMADol (ULTRAM) 50 MG tablet Take 50 mg by mouth every 6 (six) hours as needed for moderate pain.     No current facility-administered medications for this visit.    REVIEW OF SYSTEMS:  [X]  denotes positive finding, [ ]  denotes negative finding Cardiac  Comments:  Chest pain or chest pressure:    Shortness of breath upon exertion:    Short of breath when lying flat:    Irregular heart rhythm:        Vascular    Pain in calf, thigh, or hip brought on by ambulation:    Pain in feet at night that wakes you up from your sleep:     Blood clot in your veins:    Leg swelling:  x       Pulmonary    Oxygen at home:    Productive cough:     Wheezing:         Neurologic    Sudden weakness in arms or legs:     Sudden numbness in arms or legs:     Sudden onset of difficulty speaking or slurred speech:    Temporary loss of vision in one eye:     Problems with dizziness:         Gastrointestinal    Blood in stool:     Vomited blood:         Genitourinary    Burning  when urinating:     Blood in urine:        Psychiatric  Major depression:         Hematologic    Bleeding problems:    Problems with blood clotting too easily:        Skin    Rashes or ulcers: x       Constitutional    Fever or chills:    -  PHYSICAL EXAM:   Vitals:   11/02/20 1524  BP: 138/90  Pulse: 88  Resp: (!) 22  Temp: 98.1 F (36.7 C)  TempSrc: Temporal  SpO2: 97%  Weight: (!) 495 lb (224.5 kg)  Height: 6\' 2"  (1.88 m)   Body mass index is 63.55 kg/m.  GENERAL: The patient is a well-nourished male, in no acute distress. The vital signs are documented above. CARDIAC: There is a regular rate and rhythm.  VASCULAR: I do not detect carotid bruits. He does have a palpable right femoral pulse.  I cannot palpate his left femoral pulse although it is very difficult to examine him because of his body habitus.  He is 495 pounds. He does have palpable dorsalis pedis pulses bilaterally. He has a biphasic dorsalis pedis and posterior tibial signal bilaterally. He has significant hyperpigmentation bilaterally with an extensive wound on his right leg as documented in the photograph below.  He has significant lipodermatosclerosis.      PULMONARY: There is good air exchange bilaterally without wheezing or rales. ABDOMEN: Soft and non-tender with normal pitched bowel sounds.  MUSCULOSKELETAL: There are no major deformities. NEUROLOGIC: No focal weakness or paresthesias are detected. SKIN: There are no ulcers or rashes noted. PSYCHIATRIC: The patient has a normal affect.  DATA:    VENOUS DUPLEX: I have independently interpreted his venous duplex scan of the right lower extremity.  He has no evidence of DVT on the right.  He does have deep venous reflux involving the common femoral vein.  There is no significant superficial venous reflux.  He has previously had a right great saphenous vein ablated.     [facility]

## 2020-11-06 ENCOUNTER — Other Ambulatory Visit: Payer: Self-pay

## 2020-11-06 DIAGNOSIS — I872 Venous insufficiency (chronic) (peripheral): Secondary | ICD-10-CM

## 2020-11-07 ENCOUNTER — Other Ambulatory Visit: Payer: Self-pay

## 2020-11-07 ENCOUNTER — Encounter (HOSPITAL_BASED_OUTPATIENT_CLINIC_OR_DEPARTMENT_OTHER): Payer: 59 | Admitting: Internal Medicine

## 2020-11-07 DIAGNOSIS — I5042 Chronic combined systolic (congestive) and diastolic (congestive) heart failure: Secondary | ICD-10-CM

## 2020-11-07 DIAGNOSIS — E11622 Type 2 diabetes mellitus with other skin ulcer: Secondary | ICD-10-CM | POA: Diagnosis not present

## 2020-11-07 MED ORDER — METOLAZONE 2.5 MG PO TABS: ORAL_TABLET | ORAL | 10 refills | Status: DC

## 2020-11-08 NOTE — Progress Notes (Signed)
Geibel, Brentin T ([id number]) . Visit Report for 11/07/2020 Arrival Information Details Patient Name: Date of Service: [name] 11/07/2020 3:30 PM Medical Record Number: [id number] Patient Account Number: [id number] Date of Birth/Sex: Treating RN: 08/13/1980 (40 y.o. [name], [name] Primary Care [place]ya Point: Marcy Siren Other Clinician: Referring Julieanne Hadsall: Treating Cleora Karnik/Extender: [name] in Treatment: 7 Visit Information History Since Last Visit Added or deleted any medications: No Patient Arrived: Ambulatory Any new allergies or adverse reactions: No Arrival Time: 15:44 Had a fall or experienced change in No Accompanied By: self activities of daily living that may affect Transfer Assistance: None risk of falls: Patient Identification Verified: Yes Signs or symptoms of abuse/neglect since last visito No Secondary Verification Process Completed: Yes Hospitalized since last visit: No Patient Requires Transmission-Based Precautions: No Implantable device outside of the clinic excluding No Patient Has Alerts: No cellular tissue based products placed in the center since last visit: Has Dressing in Place as Prescribed: Yes Pain Present Now: Yes Electronic Signature(s) Signed: 11/08/2020 8:29:32 AM By: Karl Ito Entered By: Karl Ito on 11/07/2020 15:44:34 -------------------------------------------------------------------------------- Compression Therapy Details Patient Name: Date of Service: Shawn Bering T. 11/07/2020 3:30 PM Medical Record Number: [id number] Patient Account Number: [id number] Date of Birth/Sex: Treating RN: 06-14-1980 (40 y.o. [name], [name] Primary Care Camiyah Friberg: Marcy Siren Other Clinician: Referring Coleson Kant: Treating Sher Hellinger/Extender: [name] in Treatment: 7 Compression Therapy Performed for Wound Assessment: Wound #32 Right,Medial Lower  Leg Performed By: Clinician Fonnie Mu, RN Compression Type: Four Layer Post Procedure Diagnosis Same as Pre-procedure Electronic Signature(s) Signed: 11/07/2020 5:24:19 PM By: Fonnie Mu RN Entered By: Fonnie Mu on 11/07/2020 17:14:30 -------------------------------------------------------------------------------- Encounter Discharge Information Details Patient Name: Date of Service: Shawn Bering T. 11/07/2020 3:30 PM Medical Record Number: [id number] Patient Account Number: [id number] Date of Birth/Sex: Treating RN: 21-Dec-1980 (40 y.o. [name], [name] Primary Care Ladarrian Asencio: Marcy Siren Other Clinician: Referring Verner Mccrone: Treating Johathon Overturf/Extender: [name] in Treatment: 7 Encounter Discharge Information Items Post Procedure Vitals Discharge Condition: Stable Temperature (F): 98.7 Ambulatory Status: Ambulatory Pulse (bpm): 74 Discharge Destination: Home Respiratory Rate (breaths/min): 17 Transportation: Private Auto Blood Pressure (mmHg): 157/94 Accompanied By: self Schedule Follow-up Appointment: Yes Clinical Summary of Care: Patient Declined Electronic Signature(s) Signed: 11/07/2020 5:24:19 PM By: Fonnie Mu RN Entered By: Fonnie Mu on 11/07/2020 17:19:07 -------------------------------------------------------------------------------- Lower Extremity Assessment Details Patient Name: Date of Service: Shawn Bering T. 11/07/2020 3:30 PM Medical Record Number: [id number] Patient Account Number: [id number] Date of Birth/Sex: Treating RN: 05-02-1980 (40 y.o. [name], [name] Primary Care Alexandrya Chim: Marcy Siren Other Clinician: Referring Mirtha Jain: Treating Duncan Alejandro/Extender: [name] in Treatment: 7 Edema Assessment Assessed: Kyra Searles: No] Franne Forts: Yes] Edema: [Left: Ye] [Right: s] Calf Left: Right: Point of Measurement: 36 cm From Medial Instep  57 cm Ankle Left: Right: Point of Measurement: 10 cm From Medial Instep 33.8 cm Vascular Assessment Pulses: Dorsalis Pedis Palpable: [Right:Yes] Posterior Tibial Palpable: [Right:Yes] Electronic Signature(s) Signed: 11/07/2020 5:24:19 PM By: Fonnie Mu RN Entered By: Fonnie Mu on 11/07/2020 16:01:39 -------------------------------------------------------------------------------- Multi-Disciplinary Care Plan Details Patient Name: Date of Service: Shawn Bering T. 11/07/2020 3:30 PM Medical Record Number: [id number] Patient Account Number: [id number] Date of Birth/Sex: Treating RN: Jan 17, 1981 (40 y.o. [name] Primary Care Earlie Arciga: Marcy Siren Other Clinician: Referring Jovanni Eckhart: Treating Paulina Muchmore/Extender: [name] in Treatment: 7 Active Inactive Wound/Skin Impairment Nursing Diagnoses: Impaired  tissue integrity Knowledge deficit related to ulceration/compromised skin integrity Goals: Patient will have a decrease in wound volume by X% from date: (specify in notes) Date Initiated: 09/19/2020 Target Resolution Date: 11/30/2020 Goal Status: Active Patient/caregiver will verbalize understanding of skin care regimen Date Initiated: 09/19/2020 Target Resolution Date: 01/06/2021 Goal Status: Active Ulcer/skin breakdown will have a volume reduction of 30% by week 4 Date Initiated: 09/19/2020 Target Resolution Date: 01/08/2021 Goal Status: Active Ulcer/skin breakdown will have a volume reduction of 50% by week 8 Date Initiated: 09/19/2020 Target Resolution Date: 01/06/2021 Goal Status: Active Interventions: Assess patient/caregiver ability to obtain necessary supplies Assess patient/caregiver ability to perform ulcer/skin care regimen upon admission and as needed Assess ulceration(s) every visit Notes: Electronic Signature(s) Signed: 11/07/2020 5:24:19 PM By: Fonnie Mu RN Entered By: Fonnie Mu on  11/07/2020 17:14:47 -------------------------------------------------------------------------------- Pain Assessment Details Patient Name: Date of Service: Shawn Bering T. 11/07/2020 3:30 PM Medical Record Number: [id number] Patient Account Number: [id number] Date of Birth/Sex: Treating RN: Oct 30, 1980 (40 y.o. [name], [name] Primary Care Khrystal Jeanmarie: Marcy Siren Other Clinician: Referring Grae Cannata: Treating Victorian Gunn/Extender: [name] in Treatment: 7 Active Problems Location of Pain Severity and Description of Pain Patient Has Paino Yes Site Locations Rate the pain. Rate the pain. Current Pain Level: 4 Pain Management and Medication Current Pain Management: Electronic Signature(s) Signed: 11/07/2020 5:24:19 PM By: Fonnie Mu RN Signed: 11/08/2020 8:29:32 AM By: Karl Ito Entered By: Karl Ito on 11/07/2020 15:44:47 -------------------------------------------------------------------------------- Patient/Caregiver Education Details Patient Name: Date of Service: [name] 8/30/2022andnbsp3:30 PM Medical Record Number: [id number] Patient Account Number: [id number] Date of Birth/Gender: Treating RN: 1980-11-11 (40 y.o. [name] Primary Care Physician: Marcy Siren Other Clinician: Referring Physician: Treating Physician/Extender: [name] in Treatment: 7 Education Assessment Education Provided To: Patient Education Topics Provided Basic Hygiene: Methods: Explain/Verbal Responses: State content correctly Wound/Skin Impairment: Methods: Explain/Verbal Responses: State content correctly Electronic Signature(s) Signed: 11/07/2020 5:24:19 PM By: Fonnie Mu RN Entered By: Fonnie Mu on 11/07/2020 17:15:25 -------------------------------------------------------------------------------- Wound Assessment Details Patient Name: Date of  Service: Shawn Bering T. 11/07/2020 3:30 PM Medical Record Number: [id number] Patient Account Number: [id number] Date of Birth/Sex: Treating RN: [birth year] (40 y.o. [name], [name] Primary Care Erinn Mendosa: Marcy Siren Other Clinician: Referring Ronnita Paz: Treating Ashly Yepez/Extender: [name] in Treatment: 7 Wound Status Wound Number: 32 Primary Lymphedema Etiology: Wound Location: Right, Medial Lower Leg Wound Open Wounding Event: Gradually Appeared Status: Date Acquired: 07/11/2020 Comorbid Lymphedema, Deep Vein Thrombosis, Hypertension, Peripheral Weeks Of Treatment: 7 History: Venous Disease, Type II Diabetes Clustered Wound: No Photos Wound Measurements Length: (cm) 7 Width: (cm) 22.5 Depth: (cm) 0.3 Area: (cm) 123.7 Volume: (cm) 37.11 % Reduction in Area: -14.5% % Reduction in Volume: 14.1% Epithelialization: Small (1-33%) Wound Description Classification: Full Thickness Without Exposed Support Structures Wound Margin: Distinct, outline attached Exudate Amount: Large Exudate Type: Purulent Exudate Color: yellow, brown, green Foul Odor After Cleansing: No Slough/Fibrino Yes Wound Bed Granulation Amount: Medium (34-66%) Exposed Structure Granulation Quality: Red Fascia Exposed: No Necrotic Amount: Medium (34-66%) Fat Layer (Subcutaneous Tissue) Exposed: Yes Necrotic Quality: Adherent Slough Tendon Exposed: No Muscle Exposed: No Joint Exposed: No Bone Exposed: No Treatment Notes Wound #32 (Lower Leg) Wound Laterality: Right, Medial Cleanser Soap and Water Discharge Instruction: May shower and wash wound with dial antibacterial soap and water prior to dressing change. Wound Cleanser Discharge Instruction: Cleanse the wound with wound cleanser prior to applying a  clean dressing using gauze sponges, not tissue or cotton balls. Peri-Wound Care Triamcinolone 15 (g) Discharge Instruction: Use triamcinolone 15 (g) as  directed Sween Lotion (Moisturizing lotion) Discharge Instruction: Apply moisturizing lotion as directed Topical Primary Dressing Hydrofera Blue Classic Foam, 4x4 in Discharge Instruction: Moisten with saline prior to applying to wound bed Secondary Dressing Woven Gauze Sponge, Non-Sterile 4x4 in Discharge Instruction: Apply over primary dressing as directed. ABD Pad, 5x9 Discharge Instruction: Apply over primary dressing as directed. Zetuvit Plus 4x8 in Discharge Instruction: Apply over primary dressing as directed. CarboFLEX Odor Control Dressing, 4x4 in Discharge Instruction: Apply over primary dressing as directed. Secured With Compression Wrap FourPress (4 layer compression wrap) Discharge Instruction: Apply four layer compression as directed. May also use Miliken CoFlex 2 layer compression system as alternative. Compression Stockings Add-Ons Electronic Signature(s) Signed: 11/07/2020 5:24:19 PM By: Fonnie Mu RN Signed: 11/08/2020 8:29:32 AM By: Karl Ito Entered By: Karl Ito on 11/07/2020 15:58:05 -------------------------------------------------------------------------------- Vitals Details Patient Name: Date of Service: [name], Shawn Oppenheim T. 11/07/2020 3:30 PM Medical Record Number: [id number] Patient Account Number: [id number] Date of Birth/Sex: Treating RN: [birth year] (40 y.o. [name], [name] Primary Care Zareah Hunzeker: Marcy Siren Other Clinician: Referring Lorrayne Ismael: Treating Kanav Kazmierczak/Extender: [name] in Treatment: 7 Vital Signs Time Taken: 15:58 Temperature (F): 98.1 Height (in): 74 Pulse (bpm): 88 Weight (lbs): 425 Respiratory Rate (breaths/min): 18 Body Mass Index (BMI): 54.6 Blood Pressure (mmHg): 157/94 Reference Range: 80 - 120 mg / dl Electronic Signature(s) Signed: 11/08/2020 8:29:32 AM By: Karl Ito Entered By: Karl Ito on 11/07/2020 16:00:06

## 2020-11-09 NOTE — Progress Notes (Addendum)
Scalici, Derak T ([id number]) . Visit Report for 11/07/2020 Debridement Details Patient Name: Date of Service: [name] 11/07/2020 3:30 PM Medical Record Number: [id number] Patient Account Number: [id number] Date of Birth/Sex: Treating RN: 1980-11-22 (40 y.o. [name] Primary Care Provider: Marcy Siren Other Clinician: Referring Provider: Treating Provider/Extender: [name] in Treatment: 7 Debridement Performed for Assessment: Wound #32 Right,Medial Lower Leg Performed By: Physician Maxwell Caul., MD Debridement Type: Debridement Level of Consciousness (Pre-procedure): Awake and Alert Pre-procedure Verification/Time Out Yes - 15:45 Taken: Start Time: 15:45 Pain Control: Lidocaine T Area Debrided (L x W): otal 7 (cm) x 22.2 (cm) = 155.4 (cm) Tissue and other material debrided: Viable, Non-Viable, Slough, Subcutaneous, Skin: Dermis , Skin: Epidermis, Slough Level: Skin/Subcutaneous Tissue Debridement Description: Excisional Instrument: Curette Bleeding: Minimum Hemostasis Achieved: Pressure End Time: 15:45 Procedural Pain: 0 Post Procedural Pain: 0 Response to Treatment: Procedure was tolerated well Level of Consciousness (Post- Awake and Alert procedure): Post Debridement Measurements of Total Wound Length: (cm) 7 Width: (cm) 22.5 Depth: (cm) 0.3 Volume: (cm) 37.11 Character of Wound/Ulcer Post Debridement: Improved Post Procedure Diagnosis Same as Pre-procedure Electronic Signature(s) Signed: 11/23/2020 4:58:15 PM By: Baltazar Najjar MD Signed: 12/07/2020 4:50:18 PM By: Zandra Abts RN, BSN Previous Signature: 11/08/2020 7:53:49 AM Version By: Baltazar Najjar MD Previous Signature: 11/09/2020 4:23:50 PM Version By: Fonnie Mu RN Previous Signature: 11/07/2020 5:24:19 PM Version By: Fonnie Mu RN Entered By: Zandra Abts on 11/18/2020  11:37:24 -------------------------------------------------------------------------------- HPI Details Patient Name: Date of Service: [name], Shawn Oppenheim T. 11/07/2020 3:30 PM Medical Record Number: [id number] Patient Account Number: [id number] Date of Birth/Sex: Treating RN: 1980/12/17 (40 y.o. [name] Primary Care Provider: Marcy Siren Other Clinician: Referring Provider: Treating Provider/Extender: [name] in Treatment: 7 History of Present Illness HPI Description: The patient is a 40 yrs old bm here for evaluation of his right leg ulcer. He has an extensive history of lymphedema and ulcers. He is being treated at the [facility] by Dr. [name] with [name] boots and the [facility]. He has pumps at home but he is only using them once a day. 08/30/14 selective debridement done of surface eschar. The wound cleans up quite nicely in the bed of this looks healthy. He is using a wound VAC under an Unna wrap. 11/17/14; selective debridement done of surface eschar. Once again the wound beds at clean up quite nicely. He is no longer using a wound VAC. Recent dressing changes include Hydrofera Blue. Apparently he has done a wide variety of different topical dressings including advanced treatment options like Apligraf's without success. 03/21/15; surgical debridement done of surface eschar and nonviable subcutaneous tissue. The area on the medial aspect of the right leg has closed and the wound on the lateral right leg looks improved has been using Silver Collagen 04/11/15; I think attendance here is sporadic. The area on the right medial leg remains healed. He has a new tiny area on the back of the right leg. Again a surgical debridement of the surface eschar and nonviable subcutaneous tissue of the major wound on the right lateral leg. He has been using Silver Collagen and at home, he does his own Unna wraps at home edema control seems reasonable. 04/20/15; the area on the  right medial leg has a very superficial area which may not even be open nevertheless I felt needed to be dressed today [this is his original chronic wound] the new wound is  on the right anterior lateral leg is underwent a surgical debridement. He has a small wound on the right posterior leg. He puts on his own Unna boots, according to our nurses he does this fairly well. We have been using Silver collagen. 05/02/2015 -- I understand in the past his venous studies have been done at [facility] and he was advised weight loss before they would attempt to tackle his iliac vein blockage. He has not gone back for review. 06/27/2015 -- we have applied for Apligraf and are awaiting his insurance clearance. 07/25/2015 -- he still has to use her back from his insurance agent regarding his copayment for his Apligraf. 07/31/2015 -- the patient got a reply from the insurance agent regarding the dollar payment for his application but is not sure whether it is for 5 or for one. 11/28/2015 -- has been hurting a little bit more and he has had change in color of his wound especially on the medial part. 12/05/2015 -- his culture was positive for Proteus mirabilis and K Oxytoca which are sensitive to ciprofloxacin which he is already on. 10/3/17still on ciprofloxacin. His mother reports of dressing had to be changed last Friday due to odor. He has not been systemically unwell 12/19/15; patient came in today complaining of increasing pain and tightness in his upper right thigh. He completed the ciprofloxacin 2 or 3 days ago. He is not running a fever today. 12/26/2015 -- last week he had been seen by Dr. [name] who got a lower extremity venous duplex evaluation done which did not show DVT or SVT in the right lower extremity. he had also put him on Augmentin in addition to the previous ciprofloxacin and he had received for 2 weeks 01/02/2016 -- -- was admitted to the hospital on 12/26/2015 and discharged on 12/29/2015 and  was treated for cellulitis. He was also newly diagnosed with type 2 diabetes mellitus and outpatient monitoring and initiation of treatment was recommended. Patients hemoglobin A1c was 6.6 No osteomyelitis detected on x-rays and recent Doppler study was negative for DVT Oral doxycycline and Levaquin were recommended for the patient as an . outpatient for a 14 day treatment. IV Zosyn and vancomycin was given due to concerns of Pseudomonas infection while he was in hospital. 02/13/2016 -- he has not gone back to [facility] where he had his vascular opinion earlier and I have urged him to regroup with them to see if there is any surgical options available. 02/27/2016 -- has an appointment to see [facility] vascular surgeons on January 3 and may be seeing Dr. Verdie Drown 03/19/2016 -- I was not able to find any notes on Epic but the patient did go to the vascular clinic at [facility] and saw the PA to Dr. Verdie Drown. I understand she has recommended a CT scan and MRI to be done on February 1 and they would review this with him on the same day. 04/09/2016 -- was admitted to the hospital on 03/20/2016 acute respiratory failure with hypoxia, abdominal discomfort with erythema, hypertensive urgency and chronic wound of the right leg with morbid obesity. he was discharged home on 04/05/2016 and was to continue on IV antibiotics for 18 more days follow-up with the wound center and continue with his cardiologist. he has lost approximately 130 pounds and diuresed over 48 L. He was treated with IV Zosyn and ID recommended he continue this for 3 weeks more. The notes from [facility] wound clinic were noted where  he was seen by the PA and she had taken cultures which grew MSSA and Pseudomonas. She had recommended edema control with a 4-layer compression and also referred him to the lymphedema clinic. A CT venogram was also planned for the future. 04/16/2016 -- at [facility] a CT pelvic venogram runoff was done  on 04/11/2016 -- IMPRESSION:-Limited study secondary to poor opacification of venous structures. No definite evidence of acute deep vein thrombosis. 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 -Extensive right iliac chain and right inguinal adenopathy, favored to be reactive/secondary to congestion. The patient continues on his IV antibiotics through his PICC line and has his cardiology opinion and also a bariatric surgery opinion coming up. 04/30/2016 -- He has completed his IV antibiotics through his PICC line. 05/14/2016 --he was seen by the PA, Ms Sluss, recommended alternate day dressing with compression and use Hibiclens and Dakin's solution with acetic acid on his wounds. 07/30/2016 -- he has still not got his custom-made compression stockings and I have urged him to go and get him self measured for these. 08/06/2016 -- he has been measured for his custom stocking and will get in 2 weeks. He has lost 20 pounds since March 08/27/2016 -- he has not got his custom stockings yet and he tried another pair which has not fit well and he has had a lot of maceration increase the size of his wound. 09/03/2016 -- the patient says that he has not been very compliant with his dressing changes and his elevation and exercise and this week he is notices wound get rather large with maceration. 09/18/16; according to our intake nurse the measurements on this patient's wound are slightly larger. I note previous compliance concerns. I note that his wounds measured larger last week which was noted by Dr. Meyer Russel. Culture done last week was negative. 10/01/2016 -- the patient has received some lymphedema pumps which are new and he says he is being compliant with this. He is going to be away for 2 weeks on vacation to [facility] 10/15/2016 -- he returns after 2 weeks and tells me he has been diligent with his dressing and  has lost 25 pounds over the last 3 months. 10/22/2016 -- his last hemoglobin A1c was 6.2 and he is now diet controlled. 11/19/2016 -- he has been having problems with his compression stockings which are custom made at [facility]. He has not yet been able to get them. He had taken out his compression because he had gone there and had no dressing on when he came here today READMISSION 05/22/2018 Mr. Ragon is a now 40 year old man with a Manfredi history of wounds in his lower extremities secondary to chronic venous insufficiency with secondary lymphedema. He has had previous vein ligations on the right although I do not have this information in front of me. As I understand things he also has central venous obstruction with a vein bypass in 2005 I believe this was done at [facility]. He tells me over the last 2 months he has noted mid reopening in the right mid anterior lower extremity. This is a rectangular shaped wound which is kind of odd in terms of how that formed. He has been using silver alginate and Unna boots that we used on him when he was here in 2018. He buys his product supply online thinks this is cheaper than ordering through intermediary's Past medical history; CHF, morbid obesity, hypertension, hyperlipidemia,  chronic venous insufficiency, vein bypass in 2005 previous vein ablations or ligations. He has a stent in the right lower extremity. ABI in this clinic was 1.04. He is using his compression pumps at home 3/27; 2-week follow-up. Right lower extremity wounds related to chronic venous insufficiency and lymphedema. He has been using silver alginate under an Radio broadcast assistant. He change the [employer] himself at home last week. He is making nice progress 4/10; 2-week follow-up. Right lower extremity wound is just about closed a small open area in the middle of the and large rectangular wound he had at the beginning. His edema control is excellent. He says he is using his compression pumps  religiously 4/17; 2-week follow-up. Right lower extremity wound is totally closed. He had a large rectangular wound which is progressively closed. His edema control is very good. He is using his compression pumps once to twice a day READMISSION 09/19/2020 Mr. Fleishman is now a 40 year old man we have had for several stays in this clinic including 2017, 2018 and most recently in 2020 with a wound on his right lower leg. He has compression pumps which he claims to be using twice a day. He also has stockings however I am not sure how much he uses them. Things have broken down over the last month or 2 he has an extensive wound area on the right mid tibial area I think this is similar to what he has had in the past. He has been using Xeroform and an Ace wrap that his girlfriend is applying. Past medical history; the patient has known chronic venous insufficiency with secondary lymphedema. He had an iliac vein blockage and he had a bypass at [facility] although the patient is not followed up with them. The surgeon may have been Dr. Jacolyn Reedy. As noted he has lymphedema pumps. Type 2 diabetes congestive heart failure obstructive sleep apnea. He has a history of a right leg DVT although this may have been the iliac vein he is not currently on anticoagulation ABI in our clinic is 1.37 on the right Imaging Results - in this encounter CT Pelvic Venogram Run Off Imaging Results - CT Pelvic Venogram Run Off Impressions Performed At -Limited study secondary to poor opacification of venous structures. Nodefinite evidence of acute deep vein thrombosis. 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 -Extensive right iliac chain and right inguinal adenopathy, favored to bereactive/secondary to congestion. [facility] Imaging Results - CT Pelvic Venogram Run Off Narrative Performed At EXAM: CT PELVIC VENOGRAM RUN OFF DATE: 04/11/2016  1:28 PM ACCESSION: [id number] UN DICTATED: 04/11/2016 2:18 PM INTERPRETATION LOCATION: [facility] CLINICAL INDICATION: 40 years old Male with h/o RLE DVT and massive lymphedema of both LE's. Eval for venous outflow obstruction vs extrinsic mass- L97.203-Skin ulcer of calf with necrosis of muscle, unspecified laterality(RAF-HCC) COMPARISON: CT abdomen pelvis dated 02/11/2007 TECHNIQUE: A spiral CT scan was obtained approximately 3 minutes after administration of IV contrast from the kidneys to the knees.Images were reconstructed in the axial plane. Multiplanar reformatted and MIP images were provided for further evaluation of the vessels. For selected cases, 3D volumerendered images are also provided. VASCULAR FINDINGS: There is overall poor contrast opacification of the venous structures, limitingevaluation. IVC: No evidence of thrombus. Iliac veins: The right iliac veins are chronically occluded/diminutive in size. There are extensive venous collaterals noted throughout the right pelvis extending into the right lower extremity. The number and caliber of the venous collaterals  have increased when compared to 02/11/2007. The left iliac veins appear patent. There are also small caliber venous collaterals within the left pelvis extending into the left lower extremity. Anterior abdominal wall venouscollaterals are also noted but fully imaged secondary to patient diameter. The bilateral femoral and popliteal veins appear patent. There is no evidence of acute thrombus. The arterial vasculature is unremarkable on this venous phase time study. NONVASCULAR FINDINGS: LOWER CHEST Unremarkable. : ABDOMEN: HEPATOBILIARY: Unremarkable liver. No biliary ductal dilatation. Gallbladder isremarkable. PANCREAS: Unremarkable. SPLEEN: Unremarkable. ADRENAL GLANDS: Unremarkable. KIDNEYS/URETERS: Unremarkable. BLADDER: Unremarkable. BOWEL/PERITONEUM/RETROPERITONEUM: No bowel obstruction. No acute  inflammatoryprocess. No ascites. LYMPH NODES: Extensive right iliac chain and right inguinal adenopathy. A nodal conglomerate in the right inguinal region measures 6.8 x 3.4 cm (2:124). This is only slightly larger when compared to 02/11/2007. Given interval stabilityover 10 years, these are favored to be reactive/secondary to congestion. REPRODUCTIVE: Unremarkable. BONES/SOFT TISSUES: No acute osseous abnormalities. Postsurgical changes in the right inguinal and left medial thigh region. Right greater than left softtissue edema throughout the lower extremities. [facility] Imaging Results - CT Pelvic Venogram Run Off Procedure Note Interface, Rad Results In - 04/11/2016 3:28 PM EST EXAM: CT PELVIC VENOGRAM RUN OFF DATE: 04/11/2016 1:28 PM ACCESSION: [id number] UN DICTATED: 04/11/2016 2:18 PM INTERPRETATION LOCATION: [facility] CLINICAL INDICATION: 40 years old Male with h/o RLE DVT and massive lymphedema of both LE's. Eval for venous outflow obstruction vs extrinsic mass- L97.203-Skin ulcer of calf with necrosis of muscle, unspecified laterality (RAF-HCC) COMPARISON: CT abdomen pelvis dated 02/11/2007 TECHNIQUE: A spiral CT scan was obtained approximately 3 minutes after administration of IV contrast from the kidneys to the knees. Images were reconstructed in the axial plane. Multiplanar reformatted and MIP images were provided for further evaluation of the vessels. For selected cases, 3D volume rendered images are also provided. VASCULAR FINDINGS: There is overall poor contrast opacification of the venous structures, limiting evaluation. IVC: No evidence of thrombus. Iliac veins: The right iliac veins are chronically occluded/diminutive in size. There are extensive venous collaterals noted throughout the right pelvis extending into the right lower extremity. The number and caliber of the venous collaterals have increased when compared to 02/11/2007. The left iliac veins appear patent. There are  also small caliber venous collaterals within the left pelvis extending into the left lower extremity. Anterior abdominal wall venous collaterals are also noted but fully imaged secondary to patient diameter. The bilateral femoral and popliteal veins appear patent. There is no evidence of acute thrombus. The arterial vasculature is unremarkable on this venous phase time study. NONVASCULAR FINDINGS: LOWER CHEST Unremarkable. : ABDOMEN: HEPATOBILIARY: Unremarkable liver. No biliary ductal dilatation. Gallbladder is remarkable. PANCREAS: Unremarkable. SPLEEN: Unremarkable. ADRENAL GLANDS: Unremarkable. KIDNEYS/URETERS: Unremarkable. BLADDER: Unremarkable. BOWEL/PERITONEUM/RETROPERITONEUM: No bowel obstruction. No acute inflammatory process. No ascites. LYMPH NODES: Extensive right iliac chain and right inguinal adenopathy. A nodal conglomerate in the right inguinal region measures 6.8 x 3.4 cm (2:124). This is only slightly larger when compared to 02/11/2007. Given interval stability over 10 years, these are favored to be reactive/secondary to congestion. REPRODUCTIVE: Unremarkable. BONES/SOFT TISSUES: No acute osseous abnormalities. Postsurgical changes in the right inguinal and left medial thigh region. Right greater than left soft tissue edema throughout the lower extremities. IMPRESSION: -Limited study secondary to poor opacification of venous structures. No definite evidence of acute deep vein thrombosis. 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 -Extensive right iliac chain and right inguinal adenopathy,  favored to be reactive/secondary to congestion. Imaging Results - CT Pelvic Venogram Run Off Performing Organization Address City/State/Zipcode Phone Number [facility] [address] [place], [place] [address] Surgical summary as listed below; Assessment: Farris Geiman is a 40 y.o. male with Leinberger  history of right lower extremity chronic obstructive deep venous disease with recurrent ulceration, absence of right iliac vein, prior left to right femoral vein bypass. Patient continues with recurrent ulceration, his weight precludes him from further vascular studies at this time. If he is able to lose 50-100 pounds we would be able to do left lower extremity venogram possible intervention with 50% chance of re- opening occlusion per Dr. Jacolyn Reedy. For global health, weight loss, potential improvement in his chronic venous insufficiency would appreciateinput from bariatric surgery group for recommendations. 09/26/20; patient comes in today with not much improvement. He has also some odor. 3 open areas and this linear area in his mid calf require debridement. We have been using silver alginate under compression. I gave him Keflex last week for what I thought was cellulitis in the right medial thigh he is completing this and I think this is better. 7/26; patient comes in today with again necrotic debris over these wounds. According to our intake nurse extreme malodor and drainage. We have been using silver alginate under compression. He is complaining about pain in the wound area. 7/29; Patient presents for follow-up. He reports improvement to the wound. He has finished taking Keflex. He has no issues or complaints today. 10/17/2020 upon evaluation today patient appears to be doing poorly in regard to his leg ulcer. He has been tolerating the dressing changes without complication. Fortunately there does not appear to be any signs of active infection at this time. No fevers, chills, nausea, vomiting, or diarrhea. 8/23; the patient has 2 areas anteriorly and a larger area medially on the right lower leg. He comes in today with his compression wrap slipping down. We do not have good edema control. We have been using silver alginate. I believe he completed a course of antibioticso Levaquin given to him 2 weeks  ago at his last visit. He states his girlfriend who is a nurse changes his dressings. We have not seen this in 2 weeks. He just received a compounded topical antibiotic based on I believe a deep tissue culture that was done. I do not have a result of the deep tissue culture today and he forgot the compounded antibiotic. I will try to clarify this next time I tried to send him back to vascular at [facility] for review of his central venous obstruction although they would not even see him I think because of noncompliance, or at least perceived noncompliance with regards to the recommendations for weight loss, consideration of bariatric assessment for surgery etc. 8/30; although the patient's wounds anteriorly on the right lower leg extending medially were debrided today. Better looking wound surface he has better edema control still under 4-layer compression. He tells me he is using his compression pump twice a day however they are greater than 110 years old and he might benefit from a new set. He is using his Jodie Echevaria compounded antibiotic Since the last time he was here he saw Dr. Durwin Nora at vein and vascular at our request. He was felt to have adequate arterial flow for healing although there was still the issue about whether he might need an angiogram if things still. 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 He  did not talk about the s previous central venous procedures that have been done [facility]. That information is available in care everywhere however based on the duplex exam he was not felt to be a candidate for any venous procedures on the right. He has no significant venous reflux on the right and has had previously had a right greater saphenous vein ablated Electronic Signature(s) Signed: 11/08/2020 7:53:49 AM By: Baltazar Najjar MD Entered By: Baltazar Najjar on 11/07/2020 16:24:08 -------------------------------------------------------------------------------- Physical Exam  Details Patient Name: Date of Service: Shawn Bering T. 11/07/2020 3:30 PM Medical Record Number: [id number] Patient Account Number: [id number] Date of Birth/Sex: Treating RN: 04-03-1980 (40 y.o. [name] Primary Care Provider: Marcy Siren Other Clinician: Referring Provider: Treating Provider/Extender: [name] in Treatment: 7 Constitutional Patient is hypertensive.. Pulse regular and within target range for patient.[name] Respirations regular, non-labored and within target range.. Temperature is normal and within the target range for the patient.[name] Appears in no distress. Notes Wound exam; he has the wounds anteriorly and then the larger area medially. No real change in dimensions. Necrotic debris on the surface is removed with a #5 curette hemostasis with direct pressure. I am able to get down to a better looking wound surface although there is certainly not been any evidence of epithelialization and the wounds look about the same this week. Electronic Signature(s) Signed: 11/08/2020 7:53:49 AM By: Baltazar Najjar MD Entered By: Baltazar Najjar on 11/07/2020 16:23:20 -------------------------------------------------------------------------------- Physician Orders Details Patient Name: Date of Service: [name], [name] 11/07/2020 3:30 PM Medical Record Number: [id number] Patient Account Number: [id number] Date of Birth/Sex: Treating RN: 1980-11-14 (40 y.o. [name], [name] Primary Care Provider: Marcy Siren Other Clinician: Referring Provider: Treating Provider/Extender: [name] in Treatment: 7 Verbal / Phone Orders: No Diagnosis Coding Follow-up Appointments ppointment in 1 week. - Dr. [name] Return A Bathing/ Shower/ Hygiene May shower with protection but do not get wound dressing(s) wet. - May use a cast wrap to put over wraps to shower; you can buy these at [facility] or [facility] Edema  Control - Lymphedema / SCD / Other Lymphedema Pumps. Use Lymphedema pumps on leg(s) 2-3 times a day for 45-60 minutes. If wearing any wraps or hose, do not remove them. Continue exercising as instructed. Elevate legs to the level of the heart or above for 30 minutes daily and/or when sitting, a frequency of: Avoid standing for Lemonds periods of time. Patient to wear own compression stockings every day. - on Left Leg Wound Treatment Wound #32 - Lower Leg Wound Laterality: Right, Medial Cleanser: Soap and Water 2 x Per Week/15 Days Discharge Instructions: May shower and wash wound with dial antibacterial soap and water prior to dressing change. Cleanser: Wound Cleanser (Generic) 2 x Per Week/15 Days Discharge Instructions: Cleanse the wound with wound cleanser prior to applying a clean dressing using gauze sponges, not tissue or cotton balls. Peri-Wound Care: Triamcinolone 15 (g) 2 x Per Week/15 Days Discharge Instructions: Use triamcinolone 15 (g) as directed Peri-Wound Care: Sween Lotion (Moisturizing lotion) 2 x Per Week/15 Days Discharge Instructions: Apply moisturizing lotion as directed Prim Dressing: Hydrofera Blue Classic Foam, 4x4 in (DME) (Generic) 2 x Per Week/15 Days ary Discharge Instructions: Moisten with saline prior to applying to wound bed Secondary Dressing: Woven Gauze Sponge, Non-Sterile 4x4 in (Generic) 2 x Per Week/15 Days Discharge Instructions: Apply over primary dressing as directed. Secondary Dressing: ABD Pad, 5x9 (Generic) 2 x Per  Week/15 Days Discharge Instructions: Apply over primary dressing as directed. Secondary Dressing: Zetuvit Plus 4x8 in (Generic) 2 x Per Week/15 Days Discharge Instructions: Apply over primary dressing as directed. Secondary Dressing: CarboFLEX Odor Control Dressing, 4x4 in (Generic) 2 x Per Week/15 Days Discharge Instructions: Apply over primary dressing as directed. Compression Wrap: FourPress (4 layer compression wrap) (Generic) 2 x Per  Week/15 Days Discharge Instructions: Apply four layer compression as directed. May also use Miliken CoFlex 2 layer compression system as alternative. Electronic Signature(s) Signed: 11/08/2020 7:53:49 AM By: Baltazar Najjar MD Signed: 11/08/2020 8:29:32 AM By: Karl Ito Entered By: Karl Ito on 11/07/2020 16:14:50 -------------------------------------------------------------------------------- Problem List Details Patient Name: Date of Service: [name], [name] 11/07/2020 3:30 PM Medical Record Number: [id number] Patient Account Number: [id number] Date of Birth/Sex: Treating RN: 08-14-1980 (40 y.o. [name], [name] Primary Care Provider: Marcy Siren Other Clinician: Referring Provider: Treating Provider/Extender: [name] in Treatment: 7 Active Problems ICD-10 Encounter Code Description Active Date MDM Diagnosis I87.331 Chronic venous hypertension (idiopathic) with ulcer and inflammation of right 09/19/2020 No Yes lower extremity I89.0 Lymphedema, not elsewhere classified 09/19/2020 No Yes L97.818 Non-pressure chronic ulcer of other part of right lower leg with other specified 09/19/2020 No Yes severity Inactive Problems Resolved Problems Electronic Signature(s) Signed: 11/08/2020 7:53:49 AM By: Baltazar Najjar MD Entered By: Baltazar Najjar on 11/07/2020 16:17:15 -------------------------------------------------------------------------------- Progress Note Details Patient Name: Date of Service: Shawn Bering T. 11/07/2020 3:30 PM Medical Record Number: [id number] Patient Account Number: [id number] Date of Birth/Sex: Treating RN: [birth year] (40 y.o. [name] Primary Care Provider: Marcy Siren Other Clinician: Referring Provider: Treating Provider/Extender: [name] in Treatment: 7 Subjective History of Present Illness (HPI) The patient is a 40 yrs old bm here for  evaluation of his right leg ulcer. He has an extensive history of lymphedema and ulcers. He is being treated at the [facility] by Dr. [name] with [name] boots and the [facility]. He has pumps at home but he is only using them once a day. 08/30/14 selective debridement done of surface eschar. The wound cleans up quite nicely in the bed of this looks healthy. He is using a wound VAC under an Unna wrap. 11/17/14; selective debridement done of surface eschar. Once again the wound beds at clean up quite nicely. He is no longer using a wound VAC. Recent dressing changes include Hydrofera Blue. Apparently he has done a wide variety of different topical dressings including advanced treatment options like Apligraf's without success. 03/21/15; surgical debridement done of surface eschar and nonviable subcutaneous tissue. The area on the medial aspect of the right leg has closed and the wound on the lateral right leg looks improved has been using Silver Collagen 04/11/15; I think attendance here is sporadic. The area on the right medial leg remains healed. He has a new tiny area on the back of the right leg. Again a surgical debridement of the surface eschar and nonviable subcutaneous tissue of the major wound on the right lateral leg. He has been using Silver Collagen and at home, he does his own Unna wraps at home edema control seems reasonable. 04/20/15; the area on the right medial leg has a very superficial area which may not even be open nevertheless I felt needed to be dressed today [this is his original chronic wound] the new wound is on the right anterior lateral leg is underwent a surgical debridement. He has a small wound on the  right posterior leg. He puts on his own Unna boots, according to our nurses he does this fairly well. We have been using Silver collagen. 05/02/2015 -- I understand in the past his venous studies have been done at [facility] and he was advised weight loss before they would attempt to tackle his  iliac vein blockage. He has not gone back for review. 06/27/2015 -- we have applied for Apligraf and are awaiting his insurance clearance. 07/25/2015 -- he still has to use her back from his insurance agent regarding his copayment for his Apligraf. 07/31/2015 -- the patient got a reply from the insurance agent regarding the dollar payment for his application but is not sure whether it is for 5 or for one. 11/28/2015 -- has been hurting a little bit more and he has had change in color of his wound especially on the medial part. 12/05/2015 -- his culture was positive for Proteus mirabilis and K Oxytoca which are sensitive to ciprofloxacin which he is already on. 12/12/15oostill on ciprofloxacin. His mother reports of dressing had to be changed last Friday due to odor. He has not been systemically unwell 12/19/15; patient came in today complaining of increasing pain and tightness in his upper right thigh. He completed the ciprofloxacin 2 or 3 days ago. He is not running a fever today. 12/26/2015 -- last week he had been seen by Dr. [name] who got a lower extremity venous duplex evaluation done which did not show DVT or SVT in the right lower extremity. he had also put him on Augmentin in addition to the previous ciprofloxacin and he had received for 2 weeks 01/02/2016 -- -- was admitted to the hospital on 12/26/2015 and discharged on 12/29/2015 and was treated for cellulitis. He was also newly diagnosed with type 2 diabetes mellitus and outpatient monitoring and initiation of treatment was recommended. Patientoos hemoglobin A1c was 6.6 No osteomyelitis detected on x-rays and recent Doppler study was negative for DVT Oral doxycycline and Levaquin were recommended for the patient as an . outpatient for a 14 day treatment. IV Zosyn and vancomycin was given due to concerns of Pseudomonas infection while he was in hospital. 02/13/2016 -- he has not gone back to [facility] where he had his vascular  opinion earlier and I have urged him to regroup with them to see if there is any surgical options available. 02/27/2016 -- has an appointment to see [facility] vascular surgeons on January 3 and may be seeing Dr. Verdie Drown 03/19/2016 -- I was not able to find any notes on Epic but the patient did go to the vascular clinic at [facility] and saw the PA to Dr. Verdie Drown. I understand she has recommended a CT scan and MRI to be done on February 1 and they would review this with him on the same day. 04/09/2016 -- was admitted to the hospital on 03/20/2016 acute respiratory failure with hypoxia, abdominal discomfort with erythema, hypertensive urgency and chronic wound of the right leg with morbid obesity. he was discharged home on 04/05/2016 and was to continue on IV antibiotics for 18 more days follow-up with the wound center and continue with his cardiologist. he has lost approximately 130 pounds and diuresed over 48 L. He was treated with IV Zosyn and ID recommended he continue this for 3 weeks more. The notes from [facility] wound clinic were noted where he was seen by the PA and she had taken cultures which grew MSSA and Pseudomonas. She  had recommended edema control with a 4-layer compression and also referred him to the lymphedema clinic. A CT venogram was also planned for the future. 04/16/2016 -- at [facility] a CT pelvic venogram runoff was done on 04/11/2016 -- IMPRESSION:-Limited study secondary to poor opacification of venous structures. No definite evidence of acute deep vein thrombosis. 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 -Extensive right iliac chain and right inguinal adenopathy, favored to be reactive/secondary to congestion. The patient continues on his IV antibiotics through his PICC line and has his cardiology opinion and also a bariatric surgery opinion coming up. 04/30/2016 --  He has completed his IV antibiotics through his PICC line. 05/14/2016 --he was seen by the PA, Ms Sluss, recommended alternate day dressing with compression and use Hibiclens and Dakin's solution with acetic acid on his wounds. 07/30/2016 -- he has still not got his custom-made compression stockings and I have urged him to go and get him self measured for these. 08/06/2016 -- he has been measured for his custom stocking and will get in 2 weeks. He has lost 20 pounds since March 08/27/2016 -- he has not got his custom stockings yet and he tried another pair which has not fit well and he has had a lot of maceration increase the size of his wound. 09/03/2016 -- the patient says that he has not been very compliant with his dressing changes and his elevation and exercise and this week he is notices wound get rather large with maceration. 09/18/16; according to our intake nurse the measurements on this patient's wound are slightly larger. I note previous compliance concerns. I note that his wounds measured larger last week which was noted by Dr. Meyer Russel. Culture done last week was negative. 10/01/2016 -- the patient has received some lymphedema pumps which are new and he says he is being compliant with this. He is going to be away for 2 weeks on vacation to [facility] 10/15/2016 -- he returns after 2 weeks and tells me he has been diligent with his dressing and has lost 25 pounds over the last 3 months. 10/22/2016 -- his last hemoglobin A1c was 6.2 and he is now diet controlled. 11/19/2016 -- he has been having problems with his compression stockings which are custom made at [facility]. He has not yet been able to get them. He had taken out his compression because he had gone there and had no dressing on when he came here today READMISSION 05/22/2018 Mr. Grimmett is a now 40 year old man with a Gangi history of wounds in his lower extremities secondary to chronic venous insufficiency with  secondary lymphedema. He has had previous vein ligations on the right although I do not have this information in front of me. 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 He tells me over the last 2 months he has noted mid reopening in the right mid anterior lower extremity. This is a rectangular shaped wound which is kind of odd in terms of how that formed. He has been using silver alginate and Unna boots that we used on him when he was here in 2018. He buys his product supply online thinks this is cheaper than ordering through intermediary's Past medical history; CHF, morbid obesity, hypertension, hyperlipidemia, chronic venous insufficiency, vein bypass in 2005 previous vein ablations or ligations. He has a stent in  the right lower extremity. ABI in this clinic was 1.04. He is using his compression pumps at home 3/27; 2-week follow-up. Right lower extremity wounds related to chronic venous insufficiency and lymphedema. He has been using silver alginate under an Radio broadcast assistant. He change the [employer] himself at home last week. He is making nice progress 4/10; 2-week follow-up. Right lower extremity wound is just about closed a small open area in the middle of the and large rectangular wound he had at the beginning. His edema control is excellent. He says he is using his compression pumps religiously 4/17; 2-week follow-up. Right lower extremity wound is totally closed. He had a large rectangular wound which is progressively closed. His edema control is very good. He is using his compression pumps once to twice a day READMISSION 09/19/2020 Mr. Damron is now a 40 year old man we have had for several stays in this clinic including 2017, 2018 and most recently in 2020 with a wound on his right lower leg. He has compression pumps which he claims to be using twice a day. He also has stockings however I am not sure how much he uses  them. Things have broken down over the last month or 2 he has an extensive wound area on the right mid tibial area I think this is similar to what he has had in the past. He has been using Xeroform and an Ace wrap that his girlfriend is applying. Past medical history; the patient has known chronic venous insufficiency with secondary lymphedema. He had an iliac vein blockage and he had a bypass at [facility] although the patient is not followed up with them. The surgeon may have been Dr. Jacolyn Reedy. As noted he has lymphedema pumps. Type 2 diabetes congestive heart failure obstructive sleep apnea. He has a history of a right leg DVT although this may have been the iliac vein he is not currently on anticoagulation ABI in our clinic is 1.37 on the right Imaging Results - in this encounter CT Pelvic Venogram Run Off Imaging Results - CT Pelvic Venogram Run Off Impressions Performed At -Limited study secondary to poor opacification of venous structures. Nodefinite evidence of acute deep vein thrombosis. 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 -Extensive right iliac chain and right inguinal adenopathy, favored to bereactive/secondary to congestion. [facility] Imaging Results - CT Pelvic Venogram Run Off Narrative Performed At EXAM: CT PELVIC VENOGRAM RUN OFF DATE: 04/11/2016 1:28 PM ACCESSION: [id number] UN DICTATED: 04/11/2016 2:18 PM INTERPRETATION LOCATION: [facility] CLINICAL INDICATION: 40 years old Male with h/o RLE DVT and massive lymphedema of both LE's. Eval for venous outflow obstruction vs extrinsic mass- L97.203-Skin ulcer of calf with necrosis of muscle, unspecified laterality(RAF-HCC) COMPARISON: CT abdomen pelvis dated 02/11/2007 TECHNIQUE: A spiral CT scan was obtained approximately 3 minutes after administration of IV contrast from the kidneys to the knees. Images were reconstructed in the  axial plane. Multiplanar reformatted and MIP images were provided for further evaluation of the vessels. For selected cases, 3D volumerendered images are also provided. VASCULAR FINDINGS: There is overall poor contrast opacification of the venous structures, limitingevaluation. IVC: No evidence of thrombus. Iliac veins: The right iliac veins are chronically occluded/diminutive in size. There are extensive venous collaterals noted throughout the right pelvis extending into the right lower extremity. The number and caliber of the venous collaterals have increased when compared to 02/11/2007. The left iliac veins appear patent. There are also small  caliber venous collaterals within the left pelvis extending into the left lower extremity. Anterior abdominal wall venouscollaterals are also noted but fully imaged secondary to patient diameter. The bilateral femoral and popliteal veins appear patent. There is no evidence of acute thrombus. The arterial vasculature is unremarkable on this venous phase time study. NONVASCULAR FINDINGS: LOWER CHEST Unremarkable. : ABDOMEN: HEPATOBILIARY: Unremarkable liver. No biliary ductal dilatation. Gallbladder isremarkable. PANCREAS: Unremarkable. SPLEEN: Unremarkable. ADRENAL GLANDS: Unremarkable. KIDNEYS/URETERS: Unremarkable. BLADDER: Unremarkable. BOWEL/PERITONEUM/RETROPERITONEUM: No bowel obstruction. No acute inflammatoryprocess. No ascites. LYMPH NODES: Extensive right iliac chain and right inguinal adenopathy. A nodal conglomerate in the right inguinal region measures 6.8 x 3.4 cm (2:124). This is only slightly larger when compared to 02/11/2007. Given interval stabilityover 10 years, these are favored to be reactive/secondary to congestion. REPRODUCTIVE: Unremarkable. BONES/SOFT TISSUES: No acute osseous abnormalities. Postsurgical changes in the right inguinal and left medial thigh region. Right greater than left softtissue edema throughout the lower  extremities. [facility] Imaging Results - CT Pelvic Venogram Run Off Procedure Note Interface, Rad Results In - 04/11/2016 3:28 PM EST EXAM: CT PELVIC VENOGRAM RUN OFF DATE: 04/11/2016 1:28 PM ACCESSION: [id number] UN DICTATED: 04/11/2016 2:18 PM INTERPRETATION LOCATION: [facility] CLINICAL INDICATION: 40 years old Male with h/o RLE DVT and massive lymphedema of both LE's. Eval for venous outflow obstruction vs extrinsic mass- L97.203-Skin ulcer of calf with necrosis of muscle, unspecified laterality (RAF-HCC) COMPARISON: CT abdomen pelvis dated 02/11/2007 TECHNIQUE: A spiral CT scan was obtained approximately 3 minutes after administration of IV contrast from the kidneys to the knees. Images were reconstructed in the axial plane. Multiplanar reformatted and MIP images were provided for further evaluation of the vessels. For selected cases, 3D volume rendered images are also provided. VASCULAR FINDINGS: There is overall poor contrast opacification of the venous structures, limiting evaluation. IVC: No evidence of thrombus. Iliac veins: The right iliac veins are chronically occluded/diminutive in size. There are extensive venous collaterals noted throughout the right pelvis extending into the right lower extremity. The number and caliber of the venous collaterals have increased when compared to 02/11/2007. The left iliac veins appear patent. There are also small caliber venous collaterals within the left pelvis extending into the left lower extremity. Anterior abdominal wall venous collaterals are also noted but fully imaged secondary to patient diameter. The bilateral femoral and popliteal veins appear patent. There is no evidence of acute thrombus. The arterial vasculature is unremarkable on this venous phase time study. NONVASCULAR FINDINGS: LOWER CHEST Unremarkable. : ABDOMEN: HEPATOBILIARY: Unremarkable liver. No biliary ductal dilatation. Gallbladder is remarkable. PANCREAS:  Unremarkable. SPLEEN: Unremarkable. ADRENAL GLANDS: Unremarkable. KIDNEYS/URETERS: Unremarkable. BLADDER: Unremarkable. BOWEL/PERITONEUM/RETROPERITONEUM: No bowel obstruction. No acute inflammatory process. No ascites. LYMPH NODES: Extensive right iliac chain and right inguinal adenopathy. A nodal conglomerate in the right inguinal region measures 6.8 x 3.4 cm (2:124). This is only slightly larger when compared to 02/11/2007. Given interval stability over 10 years, these are favored to be reactive/secondary to congestion. REPRODUCTIVE: Unremarkable. BONES/SOFT TISSUES: No acute osseous abnormalities. Postsurgical changes in the right inguinal and left medial thigh region. Right greater than left soft tissue edema throughout the lower extremities. IMPRESSION: -Limited study secondary to poor opacification of venous structures. No definite evidence of acute deep vein thrombosis. 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 -Extensive right iliac chain and right inguinal adenopathy, favored to be reactive/secondary to congestion. Imaging Results - CT Pelvic Venogram Run Off Performing Organization  Address City/State/Zipcode Phone Number [facility] [address] [place], [place] [address] Surgical summary as listed below; Assessment: Cristen Murcia is a 40 y.o. male with Guastella history of right lower extremity chronic obstructive deep venous disease with recurrent ulceration, absence of right iliac vein, prior left to right femoral vein bypass. Patient continues with recurrent ulceration, his weight precludes him from further vascular studies at this time. If he is able to lose 50-100 pounds we would be able to do left lower extremity venogram possible intervention with 50% chance of re- opening occlusion per Dr. Jacolyn Reedy. For global health, weight loss, potential improvement in his chronic venous insufficiency would  appreciateinput from bariatric surgery group for recommendations. 09/26/20; patient comes in today with not much improvement. He has also some odor. 3 open areas and this linear area in his mid calf require debridement. We have been using silver alginate under compression. I gave him Keflex last week for what I thought was cellulitis in the right medial thigh he is completing this and I think this is better. 7/26; patient comes in today with again necrotic debris over these wounds. According to our intake nurse extreme malodor and drainage. We have been using silver alginate under compression. He is complaining about pain in the wound area. 7/29; Patient presents for follow-up. He reports improvement to the wound. He has finished taking Keflex. He has no issues or complaints today. 10/17/2020 upon evaluation today patient appears to be doing poorly in regard to his leg ulcer. He has been tolerating the dressing changes without complication. Fortunately there does not appear to be any signs of active infection at this time. No fevers, chills, nausea, vomiting, or diarrhea. 8/23; the patient has 2 areas anteriorly and a larger area medially on the right lower leg. He comes in today with his compression wrap slipping down. We do not have good edema control. We have been using silver alginate. I believe he completed a course of antibioticso Levaquin given to him 2 weeks ago at his last visit. He states his girlfriend who is a nurse changes his dressings. We have not seen this in 2 weeks. He just received a compounded topical antibiotic based on I believe a deep tissue culture that was done. I do not have a result of the deep tissue culture today and he forgot the compounded antibiotic. I will try to clarify this next time I tried to send him back to vascular at [facility] for review of his central venous obstruction although they would not even see him I think because of noncompliance, or at least perceived  noncompliance with regards to the recommendations for weight loss, consideration of bariatric assessment for surgery etc. 8/30; although the patient's wounds anteriorly on the right lower leg extending medially were debrided today. Better looking wound surface he has better edema control still under 4-layer compression. He tells me he is using his compression pump twice a day however they are greater than 46 years old and he might benefit from a new set. Since the last time he was here he saw Dr. Durwin Nora at [facility] vascular at our request. He was felt to have adequate arterial flow for healing although there was still the issue about whether he might need an angiogram if things still. 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 He did not talk about the s previous central venous procedures that have been done at [facility]. That information is available in care everywhere  however based on the duplex exam he was not felt to be a candidate for any venous procedures on the right. He has no significant venous reflux on the right and has had previously had a right greater saphenous vein ablated Objective Constitutional Patient is hypertensive.. Pulse regular and within target range for patient.[name]. Respirations regular, non-labored and within target range.. Temperature is normal and within the target range for the patient.[name]. Appears in no distress. Vitals Time Taken: 3:58 PM, Height: 74 in, Weight: 425 lbs, BMI: 54.6, Temperature: 98.1 F, Pulse: 88 bpm, Respiratory Rate: 18 breaths/min, Blood Pressure: 157/94 mmHg. General Notes: Wound exam; he has the wounds anteriorly and then the larger area medially. No real change in dimensions. Necrotic debris on the surface is removed with a #5 curette hemostasis with direct pressure. Integumentary (Hair, Skin) Wound #32 status is Open. Original cause of wound was Gradually Appeared. The date acquired was: 07/11/2020. The wound has been in treatment 7  weeks. The wound is located on the Right,Medial Lower Leg. The wound measures 7cm length x 22.5cm width x 0.3cm depth; 123.7cm^2 area and 37.11cm^3 volume. There is Fat Layer (Subcutaneous Tissue) exposed. There is a large amount of purulent drainage noted. The wound margin is distinct with the outline attached to the wound base. There is medium (34-66%) red granulation within the wound bed. There is a medium (34-66%) amount of necrotic tissue within the wound bed including Adherent Slough. Assessment Active Problems ICD-10 Chronic venous hypertension (idiopathic) with ulcer and inflammation of right lower extremity Lymphedema, not elsewhere classified Non-pressure chronic ulcer of other part of right lower leg with other specified severity Procedures Wound #32 Pre-procedure diagnosis of Wound #32 is a Lymphedema located on the Right,Medial Lower Leg . There was a Excisional Skin/Subcutaneous Tissue Debridement with a total area of 155.4 sq cm performed by Maxwell Caulobson, Beva Remund G., MD. With the following instrument(s): Curette to remove Viable and Non- Viable tissue/material. Material removed includes Subcutaneous Tissue, Slough, Skin: Dermis, and Skin: Epidermis after achieving pain control using Lidocaine. No specimens were taken. A time out was conducted at 15:45, prior to the start of the procedure. A Minimum amount of bleeding was controlled with Pressure. The procedure was tolerated well with a pain level of 0 throughout and a pain level of 0 following the procedure. Post Debridement Measurements: 7cm length x 22.5cm width x 0.3cm depth; 37.11cm^3 volume. Character of Wound/Ulcer Post Debridement is improved. Post procedure Diagnosis Wound #32: Same as Pre-Procedure Pre-procedure diagnosis of Wound #32 is a Lymphedema located on the Right,Medial Lower Leg . There was a Four Layer Compression Therapy Procedure by Fonnie MuBreedlove, [name], RN. Post procedure Diagnosis Wound #32: Same as  Pre-Procedure Plan Follow-up Appointments: Return Appointment in 1 week. - Dr. Leanord Hawkingobson Bathing/ Shower/ Hygiene: May shower with protection but do not get wound dressing(s) wet. - May use a cast wrap to put over wraps to shower; [facility] Edema Control - Lymphedema / SCD / Other: Lymphedema Pumps. Use Lymphedema pumps on leg(s) 2-3 times a day for 45-60 minutes. If wearing any wraps or hose, do not remove them. Continue exercising as instructed. Elevate legs to the level of the heart or above for 30 minutes daily and/or when sitting, a frequency of: Avoid standing for Mayol periods of time. Patient to wear own compression stockings every day. - on Left Leg WOUND #32: - Lower Leg Wound Laterality: Right, Medial Cleanser: Soap and Water 2 x Per Week/15 Days Discharge  Instructions: May shower and wash wound with dial antibacterial soap and water prior to dressing change. Cleanser: Wound Cleanser (Generic) 2 x Per Week/15 Days Discharge Instructions: Cleanse the wound with wound cleanser prior to applying a clean dressing using gauze sponges, not tissue or cotton balls. Peri-Wound Care: Triamcinolone 15 (g) 2 x Per Week/15 Days Discharge Instructions: Use triamcinolone 15 (g) as directed Peri-Wound Care: Sween Lotion (Moisturizing lotion) 2 x Per Week/15 Days Discharge Instructions: Apply moisturizing lotion as directed Prim Dressing: Hydrofera Blue Classic Foam, 4x4 in (DME) (Generic) 2 x Per Week/15 Days ary Discharge Instructions: Moisten with saline prior to applying to wound bed Secondary Dressing: Woven Gauze Sponge, Non-Sterile 4x4 in (Generic) 2 x Per Week/15 Days Discharge Instructions: Apply over primary dressing as directed. Secondary Dressing: ABD Pad, 5x9 (Generic) 2 x Per Week/15 Days Discharge Instructions: Apply over primary dressing as directed. Secondary Dressing: Zetuvit Plus 4x8 in (Generic) 2 x Per Week/15 Days Discharge Instructions: Apply over  primary dressing as directed. Secondary Dressing: CarboFLEX Odor Control Dressing, 4x4 in (Generic) 2 x Per Week/15 Days Discharge Instructions: Apply over primary dressing as directed. Com pression Wrap: FourPress (4 layer compression wrap) (Generic) 2 x Per Week/15 Days Discharge Instructions: Apply four layer compression as directed. May also use Miliken CoFlex 2 layer compression system as alternative. 1. I change the primary dressing to Hydrofera Blue to see if we can help with the degree of nonviable surface on the wound beds 2. Still under 4-layer compression 3. Appreciate Dr. Darletta Moll review of this. 4. No current evidence of infection Electronic Signature(s) Signed: 11/23/2020 4:58:15 PM By: Baltazar Najjar MD Signed: 12/07/2020 4:50:18 PM By: Zandra Abts RN, BSN Previous Signature: 11/08/2020 7:52:11 AM Version By: Baltazar Najjar MD Entered By: Zandra Abts on 11/18/2020 11:37:38 -------------------------------------------------------------------------------- SuperBill Details Patient Name: Date of Service: [name], [name] 11/07/2020 Medical Record Number: [id number] Patient Account Number: [id number] Date of Birth/Sex: Treating RN: 02/11/1981 (40 y.o. [name], [name] Primary Care Provider: Marcy Siren Other Clinician: Referring Provider: Treating Provider/Extender: [name] in Treatment: 7 Diagnosis Coding ICD-10 Codes Code Description [phone] Chronic venous hypertension (idiopathic) with ulcer and inflammation of right lower extremity I89.0 Lymphedema, not elsewhere classified L97.818 Non-pressure chronic ulcer of other part of right lower leg with other specified severity Facility Procedures CPT4 Code: [id number] Description: 11042 - DEB SUBQ TISSUE 20 SQ CM/< ICD-10 Diagnosis Description L97.818 Non-pressure chronic ulcer of other part of right lower leg with other specifi I87.331 Chronic venous hypertension (idiopathic)  with ulcer and inflammation of right Modifier: ed severity lower extremity Quantity: 1 Physician Procedures : CPT4 Code Description Modifier [id number] 11042 - WC PHYS SUBQ TISS 20 SQ CM ICD-10 Diagnosis Description L97.818 Non-pressure chronic ulcer of other part of right lower leg with other specified severity I87.331 Chronic venous hypertension (idiopathic)  with ulcer and inflammation of right lower extremity Quantity: 1 Electronic Signature(s) Signed: 11/08/2020 7:53:49 AM By: Baltazar Najjar MD Previous Signature: 11/07/2020 5:24:19 PM Version By: Fonnie Mu RN Entered By: Baltazar Najjar on 11/08/2020 07:52:24

## 2020-11-14 ENCOUNTER — Other Ambulatory Visit: Payer: Self-pay

## 2020-11-14 ENCOUNTER — Encounter (HOSPITAL_BASED_OUTPATIENT_CLINIC_OR_DEPARTMENT_OTHER): Payer: 59 | Attending: Internal Medicine | Admitting: Internal Medicine

## 2020-11-14 DIAGNOSIS — I87331 Chronic venous hypertension (idiopathic) with ulcer and inflammation of right lower extremity: Secondary | ICD-10-CM | POA: Insufficient documentation

## 2020-11-14 DIAGNOSIS — E119 Type 2 diabetes mellitus without complications: Secondary | ICD-10-CM | POA: Diagnosis not present

## 2020-11-14 DIAGNOSIS — G4733 Obstructive sleep apnea (adult) (pediatric): Secondary | ICD-10-CM | POA: Diagnosis not present

## 2020-11-14 DIAGNOSIS — I890 Lymphedema, not elsewhere classified: Secondary | ICD-10-CM | POA: Insufficient documentation

## 2020-11-14 DIAGNOSIS — L97818 Non-pressure chronic ulcer of other part of right lower leg with other specified severity: Secondary | ICD-10-CM | POA: Diagnosis not present

## 2020-11-14 DIAGNOSIS — I110 Hypertensive heart disease with heart failure: Secondary | ICD-10-CM | POA: Diagnosis not present

## 2020-11-14 DIAGNOSIS — Z95820 Peripheral vascular angioplasty status with implants and grafts: Secondary | ICD-10-CM | POA: Diagnosis not present

## 2020-11-14 DIAGNOSIS — Z86718 Personal history of other venous thrombosis and embolism: Secondary | ICD-10-CM | POA: Insufficient documentation

## 2020-11-14 DIAGNOSIS — Z6841 Body Mass Index (BMI) 40.0 and over, adult: Secondary | ICD-10-CM | POA: Diagnosis not present

## 2020-11-14 DIAGNOSIS — I509 Heart failure, unspecified: Secondary | ICD-10-CM | POA: Diagnosis not present

## 2020-11-15 NOTE — Progress Notes (Signed)
Cartelli, Eron T ([id number]) . Visit Report for 11/14/2020 HPI Details Patient Name: Date of Service: [name]. 11/14/2020 3:00 PM Medical Record Number: [id number] Patient Account Number: [id number] Date of Birth/Sex: Treating RN: 04-Aug-1980 (40 y.o. [name] Primary Care Provider: Marcy Siren Other Clinician: Referring Provider: Treating Provider/Extender: [name] in Treatment: 8 History of Present Illness HPI Description: The patient is a 40 yrs old bm here for evaluation of his right leg ulcer. He has an extensive history of lymphedema and ulcers. He is being treated at the [facility] by Dr. [name] with [name] boots and the [facility]. He has pumps at home but he is only using them once a day. 08/30/14 selective debridement done of surface eschar. The wound cleans up quite nicely in the bed of this looks healthy. He is using a wound VAC under an Unna wrap. 11/17/14; selective debridement done of surface eschar. Once again the wound beds at clean up quite nicely. He is no longer using a wound VAC. Recent dressing changes include Hydrofera Blue. Apparently he has done a wide variety of different topical dressings including advanced treatment options like Apligraf's without success. 03/21/15; surgical debridement done of surface eschar and nonviable subcutaneous tissue. The area on the medial aspect of the right leg has closed and the wound on the lateral right leg looks improved has been using Silver Collagen 04/11/15; I think attendance here is sporadic. The area on the right medial leg remains healed. He has a new tiny area on the back of the right leg. Again a surgical debridement of the surface eschar and nonviable subcutaneous tissue of the major wound on the right lateral leg. He has been using Silver Collagen and at home, he does his own Unna wraps at home edema control seems reasonable. 04/20/15; the area on the right medial leg has a very superficial  area which may not even be open nevertheless I felt needed to be dressed today [this is his original chronic wound] the new wound is on the right anterior lateral leg is underwent a surgical debridement. He has a small wound on the right posterior leg. He puts on his own Unna boots, according to our nurses he does this fairly well. We have been using Silver collagen. 05/02/2015 -- I understand in the past his venous studies have been done at [facility] and he was advised weight loss before they would attempt to tackle his iliac vein blockage. He has not gone back for review. 06/27/2015 -- we have applied for Apligraf and are awaiting his insurance clearance. 07/25/2015 -- he still has to use her back from his insurance agent regarding his copayment for his Apligraf. 07/31/2015 -- the patient got a reply from the insurance agent regarding the dollar payment for his application but is not sure whether it is for 5 or for one. 11/28/2015 -- has been hurting a little bit more and he has had change in color of his wound especially on the medial part. 12/05/2015 -- his culture was positive for Proteus mirabilis and K Oxytoca which are sensitive to ciprofloxacin which he is already on. 10/3/17still on ciprofloxacin. His mother reports of dressing had to be changed last Friday due to odor. He has not been systemically unwell 12/19/15; patient came in today complaining of increasing pain and tightness in his upper right thigh. He completed the ciprofloxacin 2 or 3 days ago. He is not running a fever today. 12/26/2015 -- last week  he had been seen by Dr. [name] who got a lower extremity venous duplex evaluation done which did not show DVT or SVT in the right lower extremity. he had also put him on Augmentin in addition to the previous ciprofloxacin and he had received for 2 weeks 01/02/2016 -- -- was admitted to the hospital on 12/26/2015 and discharged on 12/29/2015 and was treated for cellulitis. He was also  newly diagnosed with type 2 diabetes mellitus and outpatient monitoring and initiation of treatment was recommended. Patients hemoglobin A1c was 6.6 No osteomyelitis detected on x-rays and recent Doppler study was negative for DVT Oral doxycycline and Levaquin were recommended for the patient as an . outpatient for a 14 day treatment. IV Zosyn and vancomycin was given due to concerns of Pseudomonas infection while he was in hospital. 02/13/2016 -- he has not gone back to [facility] where he had his vascular opinion earlier and I have urged him to regroup with them to see if there is any surgical options available. 02/27/2016 -- has an appointment to see [facility] vascular surgeons on January 3 and may be seeing Dr. Verdie Drown 03/19/2016 -- I was not able to find any notes on Epic but the patient did go to the vascular clinic at [facility] and saw the PA to Dr. Verdie Drown. I understand she has recommended a CT scan and MRI to be done on February 1 and they would review this with him on the same day. 04/09/2016 -- was admitted to the hospital on 03/20/2016 acute respiratory failure with hypoxia, abdominal discomfort with erythema, hypertensive urgency and chronic wound of the right leg with morbid obesity. he was discharged home on 04/05/2016 and was to continue on IV antibiotics for 18 more days follow-up with the wound center and continue with his cardiologist. he has lost approximately 130 pounds and diuresed over 48 L. He was treated with IV Zosyn and ID recommended he continue this for 3 weeks more. The notes from [facility] wound clinic were noted where he was seen by the PA and she had taken cultures which grew MSSA and Pseudomonas. She had recommended edema control with a 4-layer compression and also referred him to the lymphedema clinic. A CT venogram was also planned for the future. 04/16/2016 -- at [facility] a CT pelvic venogram runoff was done on 04/11/2016 -- IMPRESSION:-Limited  study secondary to poor opacification of venous structures. No definite evidence of acute deep vein thrombosis. 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 -Extensive right iliac chain and right inguinal adenopathy, favored to be reactive/secondary to congestion. The patient continues on his IV antibiotics through his PICC line and has his cardiology opinion and also a bariatric surgery opinion coming up. 04/30/2016 -- He has completed his IV antibiotics through his PICC line. 05/14/2016 --he was seen by the PA, Ms Sluss, recommended alternate day dressing with compression and use Hibiclens and Dakin's solution with acetic acid on his wounds. 07/30/2016 -- he has still not got his custom-made compression stockings and I have urged him to go and get him self measured for these. 08/06/2016 -- he has been measured for his custom stocking and will get in 2 weeks. He has lost 20 pounds since March 08/27/2016 -- he has not got his custom stockings yet and he tried another pair which has not fit well and he has had a lot of maceration increase the  size of his wound. 09/03/2016 -- the patient says that he has not been very compliant with his dressing changes and his elevation and exercise and this week he is notices wound get rather large with maceration. 09/18/16; according to our intake nurse the measurements on this patient's wound are slightly larger. I note previous compliance concerns. I note that his wounds measured larger last week which was noted by Dr. Meyer Russel. Culture done last week was negative. 10/01/2016 -- the patient has received some lymphedema pumps which are new and he says he is being compliant with this. He is going to be away for 2 weeks on vacation to [facility] 10/15/2016 -- he returns after 2 weeks and tells me he has been diligent with his dressing and has lost 25 pounds over the last 3  months. 10/22/2016 -- his last hemoglobin A1c was 6.2 and he is now diet controlled. 11/19/2016 -- he has been having problems with his compression stockings which are custom made at [facility]. He has not yet been able to get them. He had taken out his compression because he had gone there and had no dressing on when he came here today READMISSION 05/22/2018 Mr. Barto is a now 40 year old man with a Suppes history of wounds in his lower extremities secondary to chronic venous insufficiency with secondary lymphedema. He has had previous vein ligations on the right although I do not have this information in front of me. As I understand things he also has central venous obstruction with a vein bypass in 2005 I believe this was done at [facility]. He tells me over the last 2 months he has noted mid reopening in the right mid anterior lower extremity. This is a rectangular shaped wound which is kind of odd in terms of how that formed. He has been using silver alginate and Unna boots that we used on him when he was here in 2018. He buys his product supply online thinks this is cheaper than ordering through intermediary's Past medical history; CHF, morbid obesity, hypertension, hyperlipidemia, chronic venous insufficiency, vein bypass in 2005 previous vein ablations or ligations. He has a stent in the right lower extremity. ABI in this clinic was 1.04. He is using his compression pumps at home 3/27; 2-week follow-up. Right lower extremity wounds related to chronic venous insufficiency and lymphedema. He has been using silver alginate under an Radio broadcast assistant. He change the [employer] himself at home last week. He is making nice progress 4/10; 2-week follow-up. Right lower extremity wound is just about closed a small open area in the middle of the and large rectangular wound he had at the beginning. His edema control is excellent. He says he is using his compression pumps religiously 4/17; 2-week follow-up. Right  lower extremity wound is totally closed. He had a large rectangular wound which is progressively closed. His edema control is very good. He is using his compression pumps once to twice a day READMISSION 09/19/2020 Mr. Wild is now a 40 year old man we have had for several stays in this clinic including 2017, 2018 and most recently in 2020 with a wound on his right lower leg. He has compression pumps which he claims to be using twice a day. He also has stockings however I am not sure how much he uses them. Things have broken down over the last month or 2 he has an extensive wound area on the right mid tibial area I think this is similar to what he has had in  the past. He has been using Xeroform and an Ace wrap that his girlfriend is applying. Past medical history; the patient has known chronic venous insufficiency with secondary lymphedema. He had an iliac vein blockage and he had a bypass at [facility] although the patient is not followed up with them. The surgeon may have been Dr. Jacolyn Reedy. As noted he has lymphedema pumps. Type 2 diabetes congestive heart failure obstructive sleep apnea. He has a history of a right leg DVT although this may have been the iliac vein he is not currently on anticoagulation ABI in our clinic is 1.37 on the right Imaging Results - in this encounter CT Pelvic Venogram Run Off Imaging Results - CT Pelvic Venogram Run Off Impressions Performed At -Limited study secondary to poor opacification of venous structures. Nodefinite evidence of acute deep vein thrombosis. 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 -Extensive right iliac chain and right inguinal adenopathy, favored to bereactive/secondary to congestion. [facility] Imaging Results - CT Pelvic Venogram Run Off Narrative Performed At EXAM: CT PELVIC VENOGRAM RUN OFF DATE: 04/11/2016 1:28 PM ACCESSION: [id number] UN DICTATED:  04/11/2016 2:18 PM INTERPRETATION LOCATION: [facility] CLINICAL INDICATION: 40 years old Male with h/o RLE DVT and massive lymphedema of both LE's. Eval for venous outflow obstruction vs extrinsic mass- L97.203-Skin ulcer of calf with necrosis of muscle, unspecified laterality(RAF-HCC) COMPARISON: CT abdomen pelvis dated 02/11/2007 TECHNIQUE: A spiral CT scan was obtained approximately 3 minutes after administration of IV contrast from the kidneys to the knees.Images were reconstructed in the axial plane. Multiplanar reformatted and MIP images were provided for further evaluation of the vessels. For selected cases, 3D volumerendered images are also provided. VASCULAR FINDINGS: There is overall poor contrast opacification of the venous structures, limitingevaluation. IVC: No evidence of thrombus. Iliac veins: The right iliac veins are chronically occluded/diminutive in size. There are extensive venous collaterals noted throughout the right pelvis extending into the right lower extremity. The number and caliber of the venous collaterals have increased when compared to 02/11/2007. The left iliac veins appear patent. There are also small caliber venous collaterals within the left pelvis extending into the left lower extremity. Anterior abdominal wall venouscollaterals are also noted but fully imaged secondary to patient diameter. The bilateral femoral and popliteal veins appear patent. There is no evidence of acute thrombus. The arterial vasculature is unremarkable on this venous phase time study. NONVASCULAR FINDINGS: LOWER CHEST Unremarkable. : ABDOMEN: HEPATOBILIARY: Unremarkable liver. No biliary ductal dilatation. Gallbladder isremarkable. PANCREAS: Unremarkable. SPLEEN: Unremarkable. ADRENAL GLANDS: Unremarkable. KIDNEYS/URETERS: Unremarkable. BLADDER: Unremarkable. BOWEL/PERITONEUM/RETROPERITONEUM: No bowel obstruction. No acute inflammatoryprocess. No ascites. LYMPH NODES: Extensive  right iliac chain and right inguinal adenopathy. A nodal conglomerate in the right inguinal region measures 6.8 x 3.4 cm (2:124). This is only slightly larger when compared to 02/11/2007. Given interval stabilityover 10 years, these are favored to be reactive/secondary to congestion. REPRODUCTIVE: Unremarkable. BONES/SOFT TISSUES: No acute osseous abnormalities. Postsurgical changes in the right inguinal and left medial thigh region. Right greater than left softtissue edema throughout the lower extremities. [facility] Imaging Results - CT Pelvic Venogram Run Off Procedure Note Interface, Rad Results In - 04/11/2016 3:28 PM EST EXAM: CT PELVIC VENOGRAM RUN OFF DATE: 04/11/2016 1:28 PM ACCESSION: [id number] UN DICTATED: 04/11/2016 2:18 PM INTERPRETATION LOCATION: [facility] CLINICAL INDICATION: 40 years old Male with h/o RLE DVT and massive lymphedema of both LE's. Eval for venous outflow obstruction vs extrinsic mass- L97.203-Skin ulcer of  calf with necrosis of muscle, unspecified laterality (RAF-HCC) COMPARISON: CT abdomen pelvis dated 02/11/2007 TECHNIQUE: A spiral CT scan was obtained approximately 3 minutes after administration of IV contrast from the kidneys to the knees. Images were reconstructed in the axial plane. Multiplanar reformatted and MIP images were provided for further evaluation of the vessels. For selected cases, 3D volume rendered images are also provided. VASCULAR FINDINGS: There is overall poor contrast opacification of the venous structures, limiting evaluation. IVC: No evidence of thrombus. Iliac veins: The right iliac veins are chronically occluded/diminutive in size. There are extensive venous collaterals noted throughout the right pelvis extending into the right lower extremity. The number and caliber of the venous collaterals have increased when compared to 02/11/2007. The left iliac veins appear patent. There are also small caliber venous collaterals within the left pelvis  extending into the left lower extremity. Anterior abdominal wall venous collaterals are also noted but fully imaged secondary to patient diameter. The bilateral femoral and popliteal veins appear patent. There is no evidence of acute thrombus. The arterial vasculature is unremarkable on this venous phase time study. NONVASCULAR FINDINGS: LOWER CHEST Unremarkable. : ABDOMEN: HEPATOBILIARY: Unremarkable liver. No biliary ductal dilatation. Gallbladder is remarkable. PANCREAS: Unremarkable. SPLEEN: Unremarkable. ADRENAL GLANDS: Unremarkable. KIDNEYS/URETERS: Unremarkable. BLADDER: Unremarkable. BOWEL/PERITONEUM/RETROPERITONEUM: No bowel obstruction. No acute inflammatory process. No ascites. LYMPH NODES: Extensive right iliac chain and right inguinal adenopathy. A nodal conglomerate in the right inguinal region measures 6.8 x 3.4 cm (2:124). This is only slightly larger when compared to 02/11/2007. Given interval stability over 10 years, these are favored to be reactive/secondary to congestion. REPRODUCTIVE: Unremarkable. BONES/SOFT TISSUES: No acute osseous abnormalities. Postsurgical changes in the right inguinal and left medial thigh region. Right greater than left soft tissue edema throughout the lower extremities. IMPRESSION: -Limited study secondary to poor opacification of venous structures. No definite evidence of acute deep vein thrombosis. 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 -Extensive right iliac chain and right inguinal adenopathy, favored to be reactive/secondary to congestion. Imaging Results - CT Pelvic Venogram Run Off Performing Organization Address City/State/Zipcode Phone Number [facility] [address] [place], [place] [address] Surgical summary as listed below; Assessment: Simran Mannis is a 40 y.o. male with [place] history of right lower extremity chronic obstructive deep  venous disease with recurrent ulceration, absence of right iliac vein, prior left to right femoral vein bypass. Patient continues with recurrent ulceration, his weight precludes him from further vascular studies at this time. If he is able to lose 50-100 pounds we would be able to do left lower extremity venogram possible intervention with 50% chance of re- opening occlusion per Dr. Jacolyn Reedy. For global health, weight loss, potential improvement in his chronic venous insufficiency would appreciateinput from bariatric surgery group for recommendations. 09/26/20; patient comes in today with not much improvement. He has also some odor. 3 open areas and this linear area in his mid calf require debridement. We have been using silver alginate under compression. I gave him Keflex last week for what I thought was cellulitis in the right medial thigh he is completing this and I think this is better. 7/26; patient comes in today with again necrotic debris over these wounds. According to our intake nurse extreme malodor and drainage. We have been using silver alginate under compression. He is complaining about pain in the wound area. 7/29; Patient presents for follow-up. He reports improvement to the wound. He has  finished taking Keflex. He has no issues or complaints today. 10/17/2020 upon evaluation today patient appears to be doing poorly in regard to his leg ulcer. He has been tolerating the dressing changes without complication. Fortunately there does not appear to be any signs of active infection at this time. No fevers, chills, nausea, vomiting, or diarrhea. 8/23; the patient has 2 areas anteriorly and a larger area medially on the right lower leg. He comes in today with his compression wrap slipping down. We do not have good edema control. We have been using silver alginate. I believe he completed a course of antibioticso Levaquin given to him 2 weeks ago at his last visit. He states his girlfriend who is a  nurse changes his dressings. We have not seen this in 2 weeks. He just received a compounded topical antibiotic based on I believe a deep tissue culture that was done. I do not have a result of the deep tissue culture today and he forgot the compounded antibiotic. I will try to clarify this next time I tried to send him back to vascular at [facility] for review of his central venous obstruction although they would not even see him I think because of noncompliance, or at least perceived noncompliance with regards to the recommendations for weight loss, consideration of bariatric assessment for surgery etc. 8/30; although the patient's wounds anteriorly on the right lower leg extending medially were debrided today. Better looking wound surface he has better edema control still under 4-layer compression. He tells me he is using his compression pump twice a day however they are greater than 72 years old and he might benefit from a new set. He is using his Jodie Echevaria compounded antibiotic Since the last time he was here he saw Dr. Durwin Nora at vein and vascular at our request. He was felt to have adequate arterial flow for healing although there was still the issue about whether he might need an angiogram if things still. 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 He did not talk about the s previous central venous procedures that have been done at [facility]. That information is available in care everywhere however based on the duplex exam he was not felt to be a candidate for any venous procedures on the right. He has no significant venous reflux on the right and has had previously had a right greater saphenous vein ablated 9/6; the patient has a cluster of small areas over the right anterior mid tibia as well as an area medially. That the air 1 medially is more confluent all of them look like they have a better surface. We have been putting him in 4-layer compression and using his compression pumps twice  a day Electronic Signature(s) Signed: 11/14/2020 5:13:37 PM By: Baltazar Najjar MD Entered By: Baltazar Najjar on 11/14/2020 17:04:32 -------------------------------------------------------------------------------- Physical Exam Details Patient Name: Date of Service: [name], [name] 11/14/2020 3:00 PM Medical Record Number: [id number] Patient Account Number: [id number] Date of Birth/Sex: Treating RN: Dec 02, 1980 (40 y.o. [name] Primary Care Provider: Marcy Siren Other Clinician: Referring Provider: Treating Provider/Extender: [name] in Treatment: 8 Constitutional Patient is hypertensive.. Pulse regular and within target range for patient.[name] Respirations regular, non-labored and within target range.. Temperature is normal and within the target range for the patient.[name] Appears in no distress. Cardiovascular Pedal pulses are palpable on the. Notes Wound exam; cluster of wounds anteriorly and then the larger area medially. The dimensions  have not really changed that much although the wound surfaces look better to me today. No aggressive debridement is edema control is moderate I think this probably has to do with his central venous obstruction but I have not been able to get anybody to look at this. Electronic Signature(s) Signed: 11/14/2020 5:13:37 PM By: Baltazar Najjar MD Entered By: Baltazar Najjar on 11/14/2020 17:05:32 -------------------------------------------------------------------------------- Physician Orders Details Patient Name: Date of Service: [name], [name] 11/14/2020 3:00 PM Medical Record Number: [id number] Patient Account Number: [id number] Date of Birth/Sex: Treating RN: October 30, 1980 (40 y.o. [name] Primary Care Provider: Marcy Siren Other Clinician: Referring Provider: Treating Provider/Extender: [name] in Treatment: 8 Verbal / Phone Orders: No Diagnosis  Coding ICD-10 Coding Code Description [phone] Chronic venous hypertension (idiopathic) with ulcer and inflammation of right lower extremity I89.0 Lymphedema, not elsewhere classified L97.818 Non-pressure chronic ulcer of other part of right lower leg with other specified severity Follow-up Appointments ppointment in 1 week. - Dr. [name] Return A Bathing/ Shower/ Hygiene May shower with protection but do not get wound dressing(s) wet. - May use a cast wrap to put over wraps to shower; you can buy these at [facility] or [facility] Edema Control - Lymphedema / SCD / Other Lymphedema Pumps. Use Lymphedema pumps on leg(s) 2-3 times a day for 45-60 minutes. If wearing any wraps or hose, do not remove them. Continue exercising as instructed. Elevate legs to the level of the heart or above for 30 minutes daily and/or when sitting, a frequency of: - throughout the day Avoid standing for Coriz periods of time. Patient to wear own compression stockings every day. - on Left Leg Wound Treatment Wound #32 - Lower Leg Wound Laterality: Right, Medial Cleanser: Soap and Water 2 x Per Week/15 Days Discharge Instructions: May shower and wash wound with dial antibacterial soap and water prior to dressing change. Cleanser: Wound Cleanser (Generic) 2 x Per Week/15 Days Discharge Instructions: Cleanse the wound with wound cleanser prior to applying a clean dressing using gauze sponges, not tissue or cotton balls. Peri-Wound Care: Triamcinolone 15 (g) 2 x Per Week/15 Days Discharge Instructions: Use triamcinolone 15 (g) as directed Peri-Wound Care: Sween Lotion (Moisturizing lotion) 2 x Per Week/15 Days Discharge Instructions: Apply moisturizing lotion as directed Topical: Keystone antibiotic gel 2 x Per Week/15 Days Discharge Instructions: Apply directly to wound bed, under primary dressing Prim Dressing: Hydrofera Blue Classic Foam, 4x4 in (Generic) 2 x Per Week/15 Days ary Discharge Instructions: Moisten with  saline prior to applying to wound bed Secondary Dressing: ABD Pad, 5x9 (Generic) 2 x Per Week/15 Days Discharge Instructions: Apply over primary dressing as directed. Secondary Dressing: Zetuvit Plus 4x8 in (Generic) 2 x Per Week/15 Days Discharge Instructions: Apply over primary dressing as directed. Secondary Dressing: CarboFLEX Odor Control Dressing, 4x4 in (Generic) 2 x Per Week/15 Days Discharge Instructions: Apply over primary dressing as directed. Compression Wrap: CoFlex TLC XL 2-layer Compression System 4x7 (in/yd) 2 x Per Week/15 Days Discharge Instructions: Apply CoFlex 2-layer compression as directed. (alt for 4 layer) Wound #34 - Lower Leg Wound Laterality: Right, Anterior Cleanser: Soap and Water 2 x Per Week/15 Days Discharge Instructions: May shower and wash wound with dial antibacterial soap and water prior to dressing change. Cleanser: Wound Cleanser (Generic) 2 x Per Week/15 Days Discharge Instructions: Cleanse the wound with wound cleanser prior to applying a clean dressing using gauze sponges, not tissue or cotton balls. Peri-Wound Care: Triamcinolone 15 (g) 2  x Per Week/15 Days Discharge Instructions: Use triamcinolone 15 (g) as directed Peri-Wound Care: Sween Lotion (Moisturizing lotion) 2 x Per Week/15 Days Discharge Instructions: Apply moisturizing lotion as directed Topical: Keystone antibiotic gel 2 x Per Week/15 Days Discharge Instructions: Apply directly to wound bed, under primary dressing Prim Dressing: Hydrofera Blue Classic Foam, 4x4 in (Generic) 2 x Per Week/15 Days ary Discharge Instructions: Moisten with saline prior to applying to wound bed Secondary Dressing: ABD Pad, 5x9 (Generic) 2 x Per Week/15 Days Discharge Instructions: Apply over primary dressing as directed. Secondary Dressing: Zetuvit Plus 4x8 in (Generic) 2 x Per Week/15 Days Discharge Instructions: Apply over primary dressing as directed. Secondary Dressing: CarboFLEX Odor Control Dressing,  4x4 in (Generic) 2 x Per Week/15 Days Discharge Instructions: Apply over primary dressing as directed. Compression Wrap: CoFlex TLC XL 2-layer Compression System 4x7 (in/yd) 2 x Per Week/15 Days Discharge Instructions: Apply CoFlex 2-layer compression as directed. (alt for 4 layer) Electronic Signature(s) Signed: 11/14/2020 5:42:35 PM By: Zandra Abts RN, BSN Signed: 11/15/2020 3:40:46 PM By: Baltazar Najjar MD Previous Signature: 11/14/2020 5:13:37 PM Version By: Baltazar Najjar MD Entered By: Zandra Abts on 11/14/2020 17:40:49 -------------------------------------------------------------------------------- Problem List Details Patient Name: Date of Service: [name], [name] 11/14/2020 3:00 PM Medical Record Number: [id number] Patient Account Number: [id number] Date of Birth/Sex: Treating RN: Jan 15, 1981 (40 y.o. [name] Primary Care Provider: Marcy Siren Other Clinician: Referring Provider: Treating Provider/Extender: [name] in Treatment: 8 Active Problems ICD-10 Encounter Code Description Active Date MDM Diagnosis I87.331 Chronic venous hypertension (idiopathic) with ulcer and inflammation of right 09/19/2020 No Yes lower extremity I89.0 Lymphedema, not elsewhere classified 09/19/2020 No Yes L97.818 Non-pressure chronic ulcer of other part of right lower leg with other specified 09/19/2020 No Yes severity Inactive Problems Resolved Problems Electronic Signature(s) Signed: 11/14/2020 5:13:37 PM By: Baltazar Najjar MD Entered By: Baltazar Najjar on 11/14/2020 17:02:11 -------------------------------------------------------------------------------- Progress Note Details Patient Name: Date of Service: [name], [name] 11/14/2020 3:00 PM Medical Record Number: [id number] Patient Account Number: [id number] Date of Birth/Sex: Treating RN: Sep 18, 1980 (40 y.o. [name] Primary Care Provider: Marcy Siren Other  Clinician: Referring Provider: Treating Provider/Extender: [name] in Treatment: 8 Subjective History of Present Illness (HPI) The patient is a 40 yrs old bm here for evaluation of his right leg ulcer. He has an extensive history of lymphedema and ulcers. He is being treated at the [facility] by Dr. [name] with [name] boots and the [place] [place]. He has pumps at home but he is only using them once a day. 08/30/14 selective debridement done of surface eschar. The wound cleans up quite nicely in the bed of this looks healthy. He is using a wound VAC under an Unna wrap. 11/17/14; selective debridement done of surface eschar. Once again the wound beds at clean up quite nicely. He is no longer using a wound VAC. Recent dressing changes include Hydrofera Blue. Apparently he has done a wide variety of different topical dressings including advanced treatment options like Apligraf's without success. 03/21/15; surgical debridement done of surface eschar and nonviable subcutaneous tissue. The area on the medial aspect of the right leg has closed and the wound on the lateral right leg looks improved has been using Silver Collagen 04/11/15; I think attendance here is sporadic. The area on the right medial leg remains healed. He has a new tiny area on the back of the right leg. Again a surgical debridement of the  surface eschar and nonviable subcutaneous tissue of the major wound on the right lateral leg. He has been using Silver Collagen and at home, he does his own Unna wraps at home edema control seems reasonable. 04/20/15; the area on the right medial leg has a very superficial area which may not even be open nevertheless I felt needed to be dressed today [this is his original chronic wound] the new wound is on the right anterior lateral leg is underwent a surgical debridement. He has a small wound on the right posterior leg. He puts on his own Unna boots, according to our nurses he does this fairly  well. We have been using Silver collagen. 05/02/2015 -- I understand in the past [facility] and he was advised weight loss before they would attempt to tackle his iliac vein blockage. He has not gone back for review. 06/27/2015 -- we have applied for Apligraf and are awaiting his insurance clearance. 07/25/2015 -- he still has to use her back from his insurance agent regarding his copayment for his Apligraf. 07/31/2015 -- the patient got a reply from the insurance agent regarding the dollar payment for his application but is not sure whether it is for 5 or for one. 11/28/2015 -- has been hurting a little bit more and he has had change in color of his wound especially on the medial part. 12/05/2015 -- his culture was positive for Proteus mirabilis and K Oxytoca which are sensitive to ciprofloxacin which he is already on. 12/12/15oostill on ciprofloxacin. His mother reports of dressing had to be changed last Friday due to odor. He has not been systemically unwell 12/19/15; patient came in today complaining of increasing pain and tightness in his upper right thigh. He completed the ciprofloxacin 2 or 3 days ago. He is not running a fever today. 12/26/2015 -- last week he had been seen by Dr. Leanord Hawkingobson who got a lower extremity venous duplex evaluation done which did not show DVT or SVT in the right lower extremity. he had also put him on Augmentin in addition to the previous ciprofloxacin and he had received for 2 weeks 01/02/2016 -- -- was admitted to the hospital on 12/26/2015 and discharged on 12/29/2015 and was treated for cellulitis. He was also newly diagnosed with type 2 diabetes mellitus and outpatient monitoring and initiation of treatment was recommended. Patientoos hemoglobin A1c was 6.6 No osteomyelitis detected on x-rays and recent Doppler study was negative for DVT Oral doxycycline and Levaquin were recommended for the patient as an . outpatient for a  14 day treatment. IV Zosyn and vancomycin was given due to concerns of Pseudomonas infection while he was in hospital. 02/13/2016 -- he has not gone back [facility] where he had his vascular opinion earlier and I have urged him to regroup with them to see if there is any surgical options available. 02/27/2016 -- has an appointment to [facility] on January 3 and may be seeing Dr. Verdie DrownBill Marston 03/19/2016 -- I was not able to find any notes on Epic but the patient did go to the [facility] clinic [facility] and saw the PA to Dr. Verdie DrownBill Marston. I understand she has recommended a CT scan and MRI to be done on February 1 and they would review this with him on the same day. 04/09/2016 -- was admitted to the hospital on 03/20/2016 acute respiratory failure with hypoxia, abdominal discomfort with erythema, hypertensive  urgency and chronic wound of the right leg with morbid obesity. he was discharged home on 04/05/2016 and was to continue on IV antibiotics for 18 more days follow-up with the wound center and continue with his cardiologist. he has lost approximately 130 pounds and diuresed over 48 L. He was treated with IV Zosyn and ID recommended he continue this for 3 weeks more. The notes from [facility] wound clinic were noted where he was seen by the PA and she had taken cultures which grew MSSA and Pseudomonas. She had recommended edema control with a 4-layer compression and also referred him to the lymphedema clinic. A CT venogram was also planned for the future. 04/16/2016 -- at [facility] a CT pelvic venogram runoff was done on 04/11/2016 -- IMPRESSION:-Limited study secondary to poor opacification of venous structures. No definite evidence of acute deep vein thrombosis. 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 -Extensive right iliac chain and right inguinal adenopathy, favored  to be reactive/secondary to congestion. The patient continues on his IV antibiotics through his PICC line and has his cardiology opinion and also a bariatric surgery opinion coming up. 04/30/2016 -- He has completed his IV antibiotics through his PICC line. 05/14/2016 --he was seen by the PA, Ms Sluss, recommended alternate day dressing with compression and use Hibiclens and Dakin's solution with acetic acid on his wounds. 07/30/2016 -- he has still not got his custom-made compression stockings and I have urged him to go and get him self measured for these. 08/06/2016 -- he has been measured for his custom stocking and will get in 2 weeks. He has lost 20 pounds since March 08/27/2016 -- he has not got his custom stockings yet and he tried another pair which has not fit well and he has had a lot of maceration increase the size of his wound. 09/03/2016 -- the patient says that he has not been very compliant with his dressing changes and his elevation and exercise and this week he is notices wound get rather large with maceration. 09/18/16; according to our intake nurse the measurements on this patient's wound are slightly larger. I note previous compliance concerns. I note that his wounds measured larger last week which was noted by Dr. Meyer Russel. Culture done last week was negative. 10/01/2016 -- the patient has received some lymphedema pumps which are new and he says he is being compliant with this. He is going to be away for 2 weeks on [place] [facility] 10/15/2016 -- he returns after 2 weeks and tells me he has been diligent with his dressing and has lost 25 pounds over the last 3 months. 10/22/2016 -- his last hemoglobin A1c was 6.2 and he is now diet controlled. 11/19/2016 -- he has been having problems with his compression stockings which are custom made at [facility]. He has not yet been able to get them. He had taken out his compression because he had gone there and had no dressing on  when he came here today READMISSION 05/22/2018 Mr. Tolle is a now 40 year old man with a [place] history of wounds in his lower extremities secondary to chronic venous insufficiency with secondary lymphedema. He has had previous vein ligations on the right although I do not have this information in front of me. 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 He tells me over the  last 2 months he has noted mid reopening in the right mid anterior lower extremity. This is a rectangular shaped wound which is kind of odd in terms of how that formed. He has been using silver alginate and Unna boots that we used on him when he was here in 2018. He buys his product supply online thinks this is cheaper than ordering through intermediary's Past medical history; CHF, morbid obesity, hypertension, hyperlipidemia, chronic venous insufficiency, vein bypass in 2005 previous vein ablations or ligations. He has a stent in the right lower extremity. ABI in this clinic was 1.04. He is using his compression pumps at home 3/27; 2-week follow-up. Right lower extremity wounds related to chronic venous insufficiency and lymphedema. He has been using silver alginate under an Radio broadcast assistant. He change the [employer] himself at home last week. He is making nice progress 4/10; 2-week follow-up. Right lower extremity wound is just about closed a small open area in the middle of the and large rectangular wound he had at the beginning. His edema control is excellent. He says he is using his compression pumps religiously 4/17; 2-week follow-up. Right lower extremity wound is totally closed. He had a large rectangular wound which is progressively closed. His edema control is very good. He is using his compression pumps once to twice a day READMISSION 09/19/2020 Mr. Nienhaus is now a 40 year old man we have had for several stays in this clinic including 2017, 2018 and most  recently in 2020 with a wound on his right lower leg. He has compression pumps which he claims to be using twice a day. He also has stockings however I am not sure how much he uses them. Things have broken down over the last month or 2 he has an extensive wound area on the right mid tibial area I think this is similar to what he has had in the past. He has been using Xeroform and an Ace wrap that his girlfriend is applying. Past medical history; the patient has known chronic venous insufficiency with secondary lymphedema. He had an iliac vein blockage and he had a bypass at [facility] although the patient is not followed up with them. The surgeon may have been Dr. Jacolyn Reedy. As noted he has lymphedema pumps. Type 2 diabetes congestive heart failure obstructive sleep apnea. He has a history of a right leg DVT although this may have been the iliac vein he is not currently on anticoagulation ABI in our clinic is 1.37 on the right Imaging Results - in this encounter CT Pelvic Venogram Run Off Imaging Results - CT Pelvic Venogram Run Off Impressions Performed At -Limited study secondary to poor opacification of venous structures. Nodefinite evidence of acute deep vein thrombosis. 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 -Extensive right iliac chain and right inguinal adenopathy, favored to bereactive/secondary to congestion. [facility] Imaging Results - CT Pelvic Venogram Run Off Narrative Performed At EXAM: CT PELVIC VENOGRAM RUN OFF DATE: 04/11/2016 1:28 PM ACCESSION: [id number] UN DICTATED: 04/11/2016 2:18 PM INTERPRETATION LOCATION: [facility] CLINICAL INDICATION: 40 years old Male with h/o RLE DVT and massive lymphedema of both LE's. Eval for venous outflow obstruction vs extrinsic mass- L97.203-Skin ulcer of calf with necrosis of muscle, unspecified laterality(RAF-HCC) COMPARISON: CT abdomen pelvis dated  02/11/2007 TECHNIQUE: A spiral CT scan was obtained approximately 3 minutes after administration of IV contrast from the kidneys to the knees. Images were reconstructed in the axial plane.  Multiplanar reformatted and MIP images were provided for further evaluation of the vessels. For selected cases, 3D volumerendered images are also provided. VASCULAR FINDINGS: There is overall poor contrast opacification of the venous structures, limitingevaluation. IVC: No evidence of thrombus. Iliac veins: The right iliac veins are chronically occluded/diminutive in size. There are extensive venous collaterals noted throughout the right pelvis extending into the right lower extremity. The number and caliber of the venous collaterals have increased when compared to 02/11/2007. The left iliac veins appear patent. There are also small caliber venous collaterals within the left pelvis extending into the left lower extremity. Anterior abdominal wall venouscollaterals are also noted but fully imaged secondary to patient diameter. The bilateral femoral and popliteal veins appear patent. There is no evidence of acute thrombus. The arterial vasculature is unremarkable on this venous phase time study. NONVASCULAR FINDINGS: LOWER CHEST Unremarkable. : ABDOMEN: HEPATOBILIARY: Unremarkable liver. No biliary ductal dilatation. Gallbladder isremarkable. PANCREAS: Unremarkable. SPLEEN: Unremarkable. ADRENAL GLANDS: Unremarkable. KIDNEYS/URETERS: Unremarkable. BLADDER: Unremarkable. BOWEL/PERITONEUM/RETROPERITONEUM: No bowel obstruction. No acute inflammatoryprocess. No ascites. LYMPH NODES: Extensive right iliac chain and right inguinal adenopathy. A nodal conglomerate in the right inguinal region measures 6.8 x 3.4 cm (2:124). This is only slightly larger when compared to 02/11/2007. Given interval stabilityover 10 years, these are favored to be reactive/secondary to congestion. REPRODUCTIVE: Unremarkable. BONES/SOFT  TISSUES: No acute osseous abnormalities. Postsurgical changes in the right inguinal and left medial thigh region. Right greater than left softtissue edema throughout the lower extremities. [facility] Imaging Results - CT Pelvic Venogram Run Off Procedure Note Interface, Rad Results In - 04/11/2016 3:28 PM EST EXAM: CT PELVIC VENOGRAM RUN OFF DATE: 04/11/2016 1:28 PM ACCESSION: [id number] UN DICTATED: 04/11/2016 2:18 PM INTERPRETATION LOCATION: [facility] CLINICAL INDICATION: 40 years old Male with h/o RLE DVT and massive lymphedema of both LE's. Eval for venous outflow obstruction vs extrinsic mass- L97.203-Skin ulcer of calf with necrosis of muscle, unspecified laterality (RAF-HCC) COMPARISON: CT abdomen pelvis dated 02/11/2007 TECHNIQUE: A spiral CT scan was obtained approximately 3 minutes after administration of IV contrast from the kidneys to the knees. Images were reconstructed in the axial plane. Multiplanar reformatted and MIP images were provided for further evaluation of the vessels. For selected cases, 3D volume rendered images are also provided. VASCULAR FINDINGS: There is overall poor contrast opacification of the venous structures, limiting evaluation. IVC: No evidence of thrombus. Iliac veins: The right iliac veins are chronically occluded/diminutive in size. There are extensive venous collaterals noted throughout the right pelvis extending into the right lower extremity. The number and caliber of the venous collaterals have increased when compared to 02/11/2007. The left iliac veins appear patent. There are also small caliber venous collaterals within the left pelvis extending into the left lower extremity. Anterior abdominal wall venous collaterals are also noted but fully imaged secondary to patient diameter. The bilateral femoral and popliteal veins appear patent. There is no evidence of acute thrombus. The arterial vasculature is unremarkable on this venous phase time  study. NONVASCULAR FINDINGS: LOWER CHEST Unremarkable. : ABDOMEN: HEPATOBILIARY: Unremarkable liver. No biliary ductal dilatation. Gallbladder is remarkable. PANCREAS: Unremarkable. SPLEEN: Unremarkable. ADRENAL GLANDS: Unremarkable. KIDNEYS/URETERS: Unremarkable. BLADDER: Unremarkable. BOWEL/PERITONEUM/RETROPERITONEUM: No bowel obstruction. No acute inflammatory process. No ascites. LYMPH NODES: Extensive right iliac chain and right inguinal adenopathy. A nodal conglomerate in the right inguinal region measures 6.8 x 3.4 cm (2:124). This is only slightly larger when compared to 02/11/2007. Given interval stability over 10 years, these are favored to be reactive/secondary to congestion. REPRODUCTIVE:  Unremarkable. BONES/SOFT TISSUES: No acute osseous abnormalities. Postsurgical changes in the right inguinal and left medial thigh region. Right greater than left soft tissue edema throughout the lower extremities. IMPRESSION: -Limited study secondary to poor opacification of venous structures. No definite evidence of acute deep vein thrombosis. 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 -Extensive right iliac chain and right inguinal adenopathy, favored to be reactive/secondary to congestion. Imaging Results - CT Pelvic Venogram Run Off Performing Organization Address City/State/Zipcode Phone Number [facility] [address] [place], [place] [address] Surgical summary as listed below; Assessment: Darold Miley is a 40 y.o. male with [place] history of right lower extremity chronic obstructive deep venous disease with recurrent ulceration, absence of right iliac vein, prior left to right femoral vein bypass. Patient continues with recurrent ulceration, his weight precludes him from further vascular studies at this time. If he is able to lose 50-100 pounds we would be able to do left lower extremity venogram  possible intervention with 50% chance of re- opening occlusion per Dr. Jacolyn Reedy. For global health, weight loss, potential improvement in his chronic venous insufficiency would appreciateinput from bariatric surgery group for recommendations. 09/26/20; patient comes in today with not much improvement. He has also some odor. 3 open areas and this linear area in his mid calf require debridement. We have been using silver alginate under compression. I gave him Keflex last week for what I thought was cellulitis in the right medial thigh he is completing this and I think this is better. 7/26; patient comes in today with again necrotic debris over these wounds. According to our intake nurse extreme malodor and drainage. We have been using silver alginate under compression. He is complaining about pain in the wound area. 7/29; Patient presents for follow-up. He reports improvement to the wound. He has finished taking Keflex. He has no issues or complaints today. 10/17/2020 upon evaluation today patient appears to be doing poorly in regard to his leg ulcer. He has been tolerating the dressing changes without complication. Fortunately there does not appear to be any signs of active infection at this time. No fevers, chills, nausea, vomiting, or diarrhea. 8/23; the patient has 2 areas anteriorly and a larger area medially on the right lower leg. He comes in today with his compression wrap slipping down. We do not have good edema control. We have been using silver alginate. I believe he completed a course of antibioticso Levaquin given to him 2 weeks ago at his last visit. He states his girlfriend who is a nurse changes his dressings. We have not seen this in 2 weeks. He just received a compounded topical antibiotic based on I believe a deep tissue culture that was done. I do not have a result of the deep tissue culture today and he forgot the compounded antibiotic. I will try to clarify this next time I tried to  send him back to vascular at [facility] for review of his central venous obstruction although they would not even see him I think because of noncompliance, or at least perceived noncompliance with regards to the recommendations for weight loss, consideration of bariatric assessment for surgery etc. 8/30; although the patient's wounds anteriorly on the right lower leg extending medially were debrided today. Better looking wound surface he has better edema control still under 4-layer compression. He tells me he is using his compression pump twice a day however they are greater than 10  years old and he might benefit from a new set. He is using his Jodie Echevaria compounded antibiotic Since the last time he was here he saw Dr. Durwin Nora at vein [facility] vascular at our request. He was felt to have adequate arterial flow for healing although there was still the issue about whether he might need an angiogram if things still. 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 He did not talk about the s previous central venous procedures that have been done at [facility]. That information is available in care everywhere however based on the duplex exam he was not felt to be a candidate for any venous procedures on the right. He has no significant venous reflux on the right and has had previously had a right greater saphenous vein ablated 9/6; the patient has a cluster of small areas over the right anterior mid tibia as well as an area medially. That the air 1 medially is more confluent all of them look like they have a better surface. We have been putting him in 4-layer compression and using his compression pumps twice a day Objective Constitutional Patient is hypertensive.. Pulse regular and within target range for patient.[name] Respirations regular, non-labored and within target range.. Temperature is normal and within the target range for the patient.[name] Appears in no distress. Vitals Time Taken: 4:21 PM, Height: 74 in, Weight:  425 lbs, BMI: 54.6, Temperature: 98.9 F, Pulse: 80 bpm, Respiratory Rate: 20 breaths/min, Blood Pressure: 152/85 mmHg. Cardiovascular Pedal pulses are palpable on the. General Notes: Wound exam; cluster of wounds anteriorly and then the larger area medially. The dimensions have not really changed that much although the wound surfaces look better to me today. No aggressive debridement is edema control is moderate I think this probably has to do with his central venous obstruction but I have not been able to get anybody to look at this. Integumentary (Hair, Skin) Wound #32 status is Open. Original cause of wound was Gradually Appeared. The date acquired was: 07/11/2020. The wound has been in treatment 8 weeks. The wound is located on the Right,Medial Lower Leg. The wound measures 5.8cm length x 8.5cm width x 0.3cm depth; 38.72cm^2 area and 11.616cm^3 volume. There is Fat Layer (Subcutaneous Tissue) exposed. There is no tunneling or undermining noted. There is a large amount of serosanguineous drainage noted. The wound margin is distinct with the outline attached to the wound base. There is medium (34-66%) red granulation within the wound bed. There is a medium (34-66%) amount of necrotic tissue within the wound bed including Adherent Slough. Wound #34 status is Open. Original cause of wound was Gradually Appeared. The date acquired was: 11/14/2020. The wound is located on the Right,Anterior Lower Leg. The wound measures 8cm length x 6.2cm width x 0.3cm depth; 38.956cm^2 area and 11.687cm^3 volume. There is Fat Layer (Subcutaneous Tissue) exposed. There is no tunneling or undermining noted. There is a large amount of serosanguineous drainage noted. There is medium (34-66%) red, pink granulation within the wound bed. There is a medium (34-66%) amount of necrotic tissue within the wound bed including Adherent Slough. Assessment Active Problems ICD-10 Chronic venous hypertension (idiopathic) with ulcer  and inflammation of right lower extremity Lymphedema, not elsewhere classified Non-pressure chronic ulcer of other part of right lower leg with other specified severity Procedures Wound #32 Pre-procedure diagnosis of Wound #32 is a Lymphedema located on the Right,Medial Lower Leg . There was a Double Layer Compression Therapy Procedure by Zandra Abts, RN.  Post procedure Diagnosis Wound #32: Same as Pre-Procedure Wound #34 Pre-procedure diagnosis of Wound #34 is a Venous Leg Ulcer located on the Right,Anterior Lower Leg . There was a Double Layer Compression Therapy Procedure by Zandra Abts, RN. Post procedure Diagnosis Wound #34: Same as Pre-Procedure Plan Follow-up Appointments: Return Appointment in 1 week. - Dr. [name] Bathing/ Shower/ Hygiene: May shower with protection but do not get wound dressing(s) wet. - May use a cast wrap to put over wraps to shower; you can buy these at [facility] or [facility] Edema Control - Lymphedema / SCD / Other: Lymphedema Pumps. Use Lymphedema pumps on leg(s) 2-3 times a day for 45-60 minutes. If wearing any wraps or hose, do not remove them. Continue exercising as instructed. Elevate legs to the level of the heart or above for 30 minutes daily and/or when sitting, a frequency of: - throughout the day Avoid standing for Mccallister periods of time. Patient to wear own compression stockings every day. - on Left Leg WOUND #32: - Lower Leg Wound Laterality: Right, Medial Cleanser: Soap and Water 2 x Per Week/15 Days Discharge Instructions: May shower and wash wound with dial antibacterial soap and water prior to dressing change. Cleanser: Wound Cleanser (Generic) 2 x Per Week/15 Days Discharge Instructions: Cleanse the wound with wound cleanser prior to applying a clean dressing using gauze sponges, not tissue or cotton balls. Peri-Wound Care: Triamcinolone 15 (g) 2 x Per Week/15 Days Discharge Instructions: Use triamcinolone 15 (g) as directed Peri-Wound Care:  Sween Lotion (Moisturizing lotion) 2 x Per Week/15 Days Discharge Instructions: Apply moisturizing lotion as directed Topical: Keystone antibiotic gel 2 x Per Week/15 Days Discharge Instructions: Apply directly to wound bed, under primary dressing Prim Dressing: Hydrofera Blue Classic Foam, 4x4 in (Generic) 2 x Per Week/15 Days ary Discharge Instructions: Moisten with saline prior to applying to wound bed Secondary Dressing: ABD Pad, 5x9 (Generic) 2 x Per Week/15 Days Discharge Instructions: Apply over primary dressing as directed. Secondary Dressing: Zetuvit Plus 4x8 in (Generic) 2 x Per Week/15 Days Discharge Instructions: Apply over primary dressing as directed. Secondary Dressing: CarboFLEX Odor Control Dressing, 4x4 in (Generic) 2 x Per Week/15 Days Discharge Instructions: Apply over primary dressing as directed. Com pression Wrap: CoFlex TLC XL 2-layer Compression System 4x7 (in/yd) 2 x Per Week/15 Days Discharge Instructions: Apply CoFlex 2-layer compression as directed. (alt for 4 layer) WOUND #34: - Lower Leg Wound Laterality: Right, Anterior Cleanser: Soap and Water 2 x Per Week/15 Days Discharge Instructions: May shower and wash wound with dial antibacterial soap and water prior to dressing change. Cleanser: Wound Cleanser (Generic) 2 x Per Week/15 Days Discharge Instructions: Cleanse the wound with wound cleanser prior to applying a clean dressing using gauze sponges, not tissue or cotton balls. Peri-Wound Care: Triamcinolone 15 (g) 2 x Per Week/15 Days Discharge Instructions: Use triamcinolone 15 (g) as directed Peri-Wound Care: Sween Lotion (Moisturizing lotion) 2 x Per Week/15 Days Discharge Instructions: Apply moisturizing lotion as directed Topical: Keystone antibiotic gel 2 x Per Week/15 Days Discharge Instructions: Apply directly to wound bed, under primary dressing Prim Dressing: Hydrofera Blue Classic Foam, 4x4 in (Generic) 2 x Per Week/15 Days ary Discharge  Instructions: Moisten with saline prior to applying to wound bed Secondary Dressing: ABD Pad, 5x9 (Generic) 2 x Per Week/15 Days Discharge Instructions: Apply over primary dressing as directed. Secondary Dressing: Zetuvit Plus 4x8 in (Generic) 2 x Per Week/15 Days Discharge Instructions: Apply over primary dressing as directed. Secondary Dressing: CarboFLEX Odor Control  Dressing, 4x4 in (Generic) 2 x Per Week/15 Days Discharge Instructions: Apply over primary dressing as directed. Com pression Wrap: CoFlex TLC XL 2-layer Compression System 4x7 (in/yd) 2 x Per Week/15 Days Discharge Instructions: Apply CoFlex 2-layer compression as directed. (alt for 4 layer) #1 Hydrofera Blue to continue. 2. I had some thoughts about an advanced treatment product although with this degree of edema I am not sure to be useful. 3. Not a lot of additional wound care options because of the overall surface area 4. In spite of his compression pumps and the compression wraps were putting on him there is still edema here. I think this has more to do with the central venous obstruction he had but I have not really been able to get anybody to look at this specifically [facility] who previously did interventions in this area Electronic Signature(s) Signed: 11/14/2020 5:42:35 PM By: Zandra Abts RN, BSN Signed: 11/15/2020 3:40:46 PM By: Baltazar Najjar MD Previous Signature: 11/14/2020 5:13:37 PM Version By: Baltazar Najjar MD Entered By: Zandra Abts on 11/14/2020 17:41:09 -------------------------------------------------------------------------------- SuperBill Details Patient Name: Date of Service: [name], [name] 11/14/2020 Medical Record Number: [id number] Patient Account Number: [id number] Date of Birth/Sex: Treating RN: [id number] (40 y.o. [name], [name] Primary Care Provider: Marcy Siren Other Clinician: Referring Provider: Treating Provider/Extender: [name] in  Treatment: 8 Diagnosis Coding ICD-10 Codes Code Description [phone] Chronic venous hypertension (idiopathic) with ulcer and inflammation of right lower extremity I89.0 Lymphedema, not elsewhere classified L97.818 Non-pressure chronic ulcer of other part of right lower leg with other specified severity Facility Procedures CPT4 Code: [id number] Description: (Facility Use Only) [phone] - APPLY MULTLAY COMPRS LWR RT LEG Modifier: Quantity: 1 Physician Procedures : CPT4 Code Description Modifier [id number] - WC PHYS LEVEL 3 - EST PT ICD-10 Diagnosis Description I87.331 Chronic venous hypertension (idiopathic) with ulcer and inflammation of right lower extremity I89.0 Lymphedema, not elsewhere classified  L97.818 Non-pressure chronic ulcer of other part of right lower leg with other specified severity Quantity: 1 Electronic Signature(s) Signed: 11/14/2020 5:42:35 PM By: Zandra Abts RN, BSN Signed: 11/15/2020 3:40:46 PM By: Baltazar Najjar MD Previous Signature: 11/14/2020 5:13:37 PM Version By: Baltazar Najjar MD Entered By: Zandra Abts on 11/14/2020 17:39:27

## 2020-11-16 NOTE — Progress Notes (Signed)
[name], [name] [name] ([id number]) . Visit Report for 11/14/2020 Arrival Information Details Patient Name: Date of Service: Shawn [name], Shawn [name]. 11/14/2020 3:00 PM Medical Record Number: [id number] Patient Account Number: [id number] Date of Birth/Sex: Treating RN: Nov 28, 1980 (40 y.o. Daphane ShepherdM) [name], Emeterio ReeveShatara Primary Care Shanyah Gattuso: Marcy SirenWallace, Catherine Other Clinician: Referring Farah Lepak: Treating Pallas Wahlert/Extender: Mikki Harborobson, Michael Wallace, Catherine Weeks in Treatment: 8 Visit Information History Since Last Visit Added or deleted any medications: No Patient Arrived: Ambulatory Any new allergies or adverse reactions: No Arrival Time: 16:21 Had Shawn fall or experienced change in No Accompanied By: alone activities of daily living that may affect Transfer Assistance: None risk of falls: Patient Identification Verified: Yes Signs or symptoms of abuse/neglect since last visito No Secondary Verification Process Completed: Yes Hospitalized since last visit: No Patient Requires Transmission-Based Precautions: No Implantable device outside of the clinic excluding No Patient Has Alerts: No cellular tissue based products placed in the center since last visit: Has Dressing in Place as Prescribed: Yes Has Compression in Place as Prescribed: Yes Pain Present Now: Yes Electronic Signature(s) Signed: 11/14/2020 5:42:35 PM By: Zandra AbtsLynch, Shatara RN, BSN Entered By: Zandra AbtsLynch, Shawn on 11/14/2020 16:21:35 -------------------------------------------------------------------------------- Compression Therapy Details Patient Name: Date of Service: Shawn [name], Shawn [name]. 11/14/2020 3:00 PM Medical Record Number: [id number] Patient Account Number: [id number] Date of Birth/Sex: Treating RN: Nov 28, 1980 (40 y.o. Shawn KollerM) [name], Shawn Primary Care Kiele Heavrin: Marcy SirenWallace, Catherine Other Clinician: Referring Christ Fullenwider: Treating Cleola Perryman/Extender: Mikki Harborobson, Michael Wallace, Catherine Weeks in Treatment: 8 Compression Therapy Performed for Wound  Assessment: Wound #32 Right,Medial Lower Leg Performed By: Clinician Zandra AbtsLynch, Shatara, RN Compression Type: Double Layer Post Procedure Diagnosis Same as Pre-procedure Electronic Signature(s) Signed: 11/14/2020 5:42:35 PM By: Zandra AbtsLynch, Shatara RN, BSN Entered By: Zandra AbtsLynch, Shawn on 11/14/2020 16:49:02 -------------------------------------------------------------------------------- Compression Therapy Details Patient Name: Date of Service: Shawn [name], Shawn [name]. 11/14/2020 3:00 PM Medical Record Number: [id number] Patient Account Number: [id number] Date of Birth/Sex: Treating RN: Nov 28, 1980 (40 y.o. Shawn KollerM) [name], Shawn Primary Care Haizel Gatchell: Marcy SirenWallace, Catherine Other Clinician: Referring Ashleyanne Hemmingway: Treating Kaydan Wilhoite/Extender: Mikki Harborobson, Michael Wallace, Catherine Weeks in Treatment: 8 Compression Therapy Performed for Wound Assessment: Wound #34 Right,Anterior Lower Leg Performed By: Clinician Zandra AbtsLynch, Shatara, RN Compression Type: Double Layer Post Procedure Diagnosis Same as Pre-procedure Electronic Signature(s) Signed: 11/14/2020 5:42:35 PM By: Zandra AbtsLynch, Shatara RN, BSN Entered By: Zandra AbtsLynch, Shawn on 11/14/2020 16:49:02 -------------------------------------------------------------------------------- Encounter Discharge Information Details Patient Name: Date of Service: Shawn [name], Shawn [name]. 11/14/2020 3:00 PM Medical Record Number: [id number] Patient Account Number: [id number] Date of Birth/Sex: Treating RN: Nov 28, 1980 (40 y.o. Shawn KollerM) [name], Shawn Primary Care Marck Mcclenny: Marcy SirenWallace, Catherine Other Clinician: Referring Sebastyan Snodgrass: Treating Koston Hennes/Extender: Mikki Harborobson, Michael Wallace, Catherine Weeks in Treatment: 8 Encounter Discharge Information Items Discharge Condition: Stable Ambulatory Status: Ambulatory Discharge Destination: Home Transportation: Private Auto Accompanied By: alone Schedule Follow-up Appointment: Yes Clinical Summary of Care: Patient Declined Electronic Signature(s) Signed: 11/14/2020  5:42:35 PM By: Zandra AbtsLynch, Shatara RN, BSN Entered By: Zandra AbtsLynch, Shawn on 11/14/2020 17:41:39 -------------------------------------------------------------------------------- Lower Extremity Assessment Details Patient Name: Date of Service: Shawn [name], Shawn [name]. 11/14/2020 3:00 PM Medical Record Number: [id number] Patient Account Number: [id number] Date of Birth/Sex: Treating RN: Nov 28, 1980 (40 y.o. Shawn KollerM) [name], Shawn Primary Care Anis Cinelli: Marcy SirenWallace, Catherine Other Clinician: Referring [place] Creswell: Treating Anacaren Kohan/Extender: Mikki Harborobson, Michael Wallace, Catherine Weeks in Treatment: 8 Edema Assessment Assessed: Kyra Searles[Left: No] Franne Forts[Right: No] Edema: [Left: Ye] [Right: s] Calf Left: Right: Point of Measurement: 36 cm From Medial Instep 59 cm Ankle Left: Right: Point of Measurement: 10 cm From Medial  Instep 35 cm Electronic Signature(s) Signed: 11/14/2020 5:42:35 PM By: Zandra Abts RN, BSN Entered By: Zandra Abts on 11/14/2020 16:35:56 -------------------------------------------------------------------------------- Multi Wound Chart Details Patient Name: Date of Service: Shawn [name], Shawn [name]. 11/14/2020 3:00 PM Medical Record Number: [id number] Patient Account Number: [id number] Date of Birth/Sex: Treating RN: Jul 28, 1980 (40 y.o. [name], [name] Primary Care Renie Stelmach: Marcy Siren Other Clinician: Referring Caillou Minus: Treating Florentino Laabs/Extender: [name] in Treatment: 8 Vital Signs Height(in): 74 Pulse(bpm): 80 Weight(lbs): 425 Blood Pressure(mmHg): 152/85 Body Mass Index(BMI): 55 Temperature(F): 98.9 Respiratory Rate(breaths/min): 20 Photos: [N/Shawn:N/Shawn] Right, Medial Lower Leg Right, Anterior Lower Leg N/Shawn Wound Location: Gradually Appeared Gradually Appeared N/Shawn Wounding Event: Lymphedema Venous Leg Ulcer N/Shawn Primary Etiology: Lymphedema, Deep Vein Thrombosis, Lymphedema, Deep Vein Thrombosis, N/Shawn Comorbid History: Hypertension, Peripheral Venous  Hypertension, Peripheral Venous Disease, Type II Diabetes Disease, Type II Diabetes 07/11/2020 11/14/2020 N/Shawn Date Acquired: 8 0 N/Shawn Weeks of Treatment: Open Open N/Shawn Wound Status: No Yes N/Shawn Clustered Wound: N/Shawn 3 N/Shawn Clustered Quantity: 5.8x8.5x0.3 8x6.2x0.3 N/Shawn Measurements L x W x D (cm) 38.72 38.956 N/Shawn Shawn (cm) : rea 11.616 11.687 N/Shawn Volume (cm) : 64.10% N/Shawn N/Shawn % Reduction in Area: 73.10% N/Shawn N/Shawn % Reduction in Volume: Full Thickness Without Exposed Full Thickness Without Exposed N/Shawn Classification: Support Structures Support Structures Large Large N/Shawn Exudate Amount: Serosanguineous Serosanguineous N/Shawn Exudate Type: red, brown red, brown N/Shawn Exudate Color: Distinct, outline attached N/Shawn N/Shawn Wound Margin: Medium (34-66%) Medium (34-66%) N/Shawn Granulation Amount: Red Red, Pink N/Shawn Granulation Quality: Medium (34-66%) Medium (34-66%) N/Shawn Necrotic Amount: Fat Layer (Subcutaneous Tissue): Yes Fat Layer (Subcutaneous Tissue): Yes N/Shawn Exposed Structures: Fascia: No Fascia: No Tendon: No Tendon: No Muscle: No Muscle: No Joint: No Joint: No Bone: No Bone: No Small (1-33%) None N/Shawn Epithelialization: Compression Therapy Compression Therapy N/Shawn Procedures Performed: Treatment Notes Electronic Signature(s) Signed: 11/14/2020 5:13:37 PM By: Baltazar Najjar MD Signed: 11/14/2020 5:22:11 PM By: Fonnie Mu RN Entered By: Baltazar Najjar on 11/14/2020 17:02:19 -------------------------------------------------------------------------------- Multi-Disciplinary Care Plan Details Patient Name: Date of Service: [name], Shawn [name]. 11/14/2020 3:00 PM Medical Record Number: [id number] Patient Account Number: [id number] Date of Birth/Sex: Treating RN: 1980/04/02 (40 y.o. Shawn [name] Primary Care Kingsten Enfield: Marcy Siren Other Clinician: Referring Rhianne Soman: Treating Altair Stanko/Extender: [name] in Treatment: 8 Active  Inactive Wound/Skin Impairment Nursing Diagnoses: Impaired tissue integrity Knowledge deficit related to ulceration/compromised skin integrity Goals: Patient/caregiver will verbalize understanding of skin care regimen Date Initiated: 09/19/2020 Target Resolution Date: 01/06/2021 Goal Status: Active Interventions: Assess patient/caregiver ability to obtain necessary supplies Assess patient/caregiver ability to perform ulcer/skin care regimen upon admission and as needed Assess ulceration(s) every visit Notes: Electronic Signature(s) Signed: 11/14/2020 5:42:35 PM By: Zandra Abts RN, BSN Entered By: Zandra Abts on 11/14/2020 17:38:58 -------------------------------------------------------------------------------- Pain Assessment Details Patient Name: Date of Service: Shawn [name], Shawn [name]. 11/14/2020 3:00 PM Medical Record Number: [id number] Patient Account Number: [id number] Date of Birth/Sex: Treating RN: October 08, 1980 (40 y.o. Shawn [name] Primary Care Kayleann Mccaffery: Marcy Siren Other Clinician: Referring Doristine Shehan: Treating Sayde Lish/Extender: [name] in Treatment: 8 Active Problems Location of Pain Severity and Description of Pain Patient Has Paino Yes Site Locations Pain Location: Pain in Ulcers With Dressing Change: Yes Rate the pain. Current Pain Level: 3 Character of Pain Describe the Pain: Tender Pain Management and Medication Current Pain Management: Medication: Yes Cold Application: No Rest: No Massage: No Activity: No [name].E.N.S.: No Heat Application: No Leg  drop or elevation: No Is the Current Pain Management Adequate: Adequate How does your wound impact your activities of daily livingo Sleep: No Bathing: No Appetite: No Relationship With Others: No Bladder Continence: No Emotions: No Bowel Continence: No Work: No Toileting: No Drive: No Dressing: No Hobbies: No Electronic Signature(s) Signed: 11/14/2020 5:42:35  PM By: Zandra Abts RN, BSN Entered By: Zandra Abts on 11/14/2020 16:35:46 -------------------------------------------------------------------------------- Patient/Caregiver Education Details Patient Name: Date of Service: Shawn [name], Theodosia Quay 9/6/2022andnbsp3:00 PM Medical Record Number: [id number] Patient Account Number: [id number] Date of Birth/Gender: Treating RN: 11-25-1980 (40 y.o. Shawn [name] Primary Care Physician: Marcy Siren Other Clinician: Referring Physician: Treating Physician/Extender: [name] in Treatment: 8 Education Assessment Education Provided To: Patient Education Topics Provided Wound/Skin Impairment: Methods: Explain/Verbal Responses: State content correctly Electronic Signature(s) Signed: 11/14/2020 5:42:35 PM By: Zandra Abts RN, BSN Entered By: Zandra Abts on 11/14/2020 17:39:11 -------------------------------------------------------------------------------- Wound Assessment Details Patient Name: Date of Service: Shawn [name], Shawn [name]. 11/14/2020 3:00 PM Medical Record Number: [id number] Patient Account Number: [id number] Date of Birth/Sex: Treating RN: September 21, 1980 (40 y.o. [name], [name] Primary Care Usama Harkless: Marcy Siren Other Clinician: Referring Anvi Mangal: Treating Jodelle Fausto/Extender: [name] in Treatment: 8 Wound Status Wound Number: 32 Primary Lymphedema Etiology: Wound Location: Right, Medial Lower Leg Wound Open Wounding Event: Gradually Appeared Status: Date Acquired: 07/11/2020 Comorbid Lymphedema, Deep Vein Thrombosis, Hypertension, Peripheral Weeks Of Treatment: 8 History: Venous Disease, Type II Diabetes Clustered Wound: No Photos Wound Measurements Length: (cm) 5.8 Width: (cm) 8.5 Depth: (cm) 0.3 Area: (cm) 38.72 Volume: (cm) 11.616 % Reduction in Area: 64.1% % Reduction in Volume: 73.1% Epithelialization: Small (1-33%) Tunneling:  No Undermining: No Wound Description Classification: Full Thickness Without Exposed Support Structures Wound Margin: Distinct, outline attached Exudate Amount: Large Exudate Type: Serosanguineous Exudate Color: red, brown Foul Odor After Cleansing: No Slough/Fibrino Yes Wound Bed Granulation Amount: Medium (34-66%) Exposed Structure Granulation Quality: Red Fascia Exposed: No Necrotic Amount: Medium (34-66%) Fat Layer (Subcutaneous Tissue) Exposed: Yes Necrotic Quality: Adherent Slough Tendon Exposed: No Muscle Exposed: No Joint Exposed: No Bone Exposed: No Treatment Notes Wound #32 (Lower Leg) Wound Laterality: Right, Medial Cleanser Soap and Water Discharge Instruction: May shower and wash wound with dial antibacterial soap and water prior to dressing change. Wound Cleanser Discharge Instruction: Cleanse the wound with wound cleanser prior to applying Shawn clean dressing using gauze sponges, not tissue or cotton balls. Peri-Wound Care Triamcinolone 15 (g) Discharge Instruction: Use triamcinolone 15 (g) as directed Sween Lotion (Moisturizing lotion) Discharge Instruction: Apply moisturizing lotion as directed Topical Keystone antibiotic gel Discharge Instruction: Apply directly to wound bed, under primary dressing Primary Dressing Hydrofera Blue Classic Foam, 4x4 in Discharge Instruction: Moisten with saline prior to applying to wound bed Secondary Dressing ABD Pad, 5x9 Discharge Instruction: Apply over primary dressing as directed. Zetuvit Plus 4x8 in Discharge Instruction: Apply over primary dressing as directed. CarboFLEX Odor Control Dressing, 4x4 in Discharge Instruction: Apply over primary dressing as directed. Secured With Compression Wrap CoFlex TLC XL 2-layer Compression System 4x7 (in/yd) Discharge Instruction: Apply CoFlex 2-layer compression as directed. (alt for 4 layer) Compression Stockings Add-Ons Electronic Signature(s) Signed: 11/14/2020 5:22:11 PM  By: Fonnie Mu RN Signed: 11/16/2020 11:10:38 AM By: Karl Ito Entered By: Karl Ito on 11/14/2020 16:29:50 -------------------------------------------------------------------------------- Wound Assessment Details Patient Name: Date of Service: Shawn [name], Shawn [name]. 11/14/2020 3:00 PM Medical Record Number: [id number] Patient Account Number: [id number] Date of Birth/Sex: Treating RN: Dec 17, 1980 (  40 y.o. [name], [name] Primary Care Niah Heinle: Marcy Siren Other Clinician: Referring Braidan Ricciardi: Treating Rosaland Shiffman/Extender: [name] in Treatment: 8 Wound Status Wound Number: 34 Primary Venous Leg Ulcer Etiology: Wound Location: Right, Anterior Lower Leg Wound Open Wounding Event: Gradually Appeared Status: Date Acquired: 11/14/2020 Comorbid Lymphedema, Deep Vein Thrombosis, Hypertension, Peripheral Weeks Of Treatment: 0 History: Venous Disease, Type II Diabetes Clustered Wound: Yes Photos Wound Measurements Length: (cm) 8 Width: (cm) 6.2 Depth: (cm) 0.3 Clustered Quantity: 3 Area: (cm) 38.956 Volume: (cm) 11.687 % Reduction in Area: % Reduction in Volume: Epithelialization: None Tunneling: No Undermining: No Wound Description Classification: Full Thickness Without Exposed Support Structures Exudate Amount: Large Exudate Type: Serosanguineous Exudate Color: red, brown Foul Odor After Cleansing: No Slough/Fibrino Yes Wound Bed Granulation Amount: Medium (34-66%) Exposed Structure Granulation Quality: Red, Pink Fascia Exposed: No Necrotic Amount: Medium (34-66%) Fat Layer (Subcutaneous Tissue) Exposed: Yes Necrotic Quality: Adherent Slough Tendon Exposed: No Muscle Exposed: No Joint Exposed: No Bone Exposed: No Treatment Notes Wound #34 (Lower Leg) Wound Laterality: Right, Anterior Cleanser Soap and Water Discharge Instruction: May shower and wash wound with dial antibacterial soap and water prior to  dressing change. Wound Cleanser Discharge Instruction: Cleanse the wound with wound cleanser prior to applying Shawn clean dressing using gauze sponges, not tissue or cotton balls. Peri-Wound Care Triamcinolone 15 (g) Discharge Instruction: Use triamcinolone 15 (g) as directed Sween Lotion (Moisturizing lotion) Discharge Instruction: Apply moisturizing lotion as directed Topical Keystone antibiotic gel Discharge Instruction: Apply directly to wound bed, under primary dressing Primary Dressing Hydrofera Blue Classic Foam, 4x4 in Discharge Instruction: Moisten with saline prior to applying to wound bed Secondary Dressing ABD Pad, 5x9 Discharge Instruction: Apply over primary dressing as directed. Zetuvit Plus 4x8 in Discharge Instruction: Apply over primary dressing as directed. CarboFLEX Odor Control Dressing, 4x4 in Discharge Instruction: Apply over primary dressing as directed. Secured With Compression Wrap CoFlex TLC XL 2-layer Compression System 4x7 (in/yd) Discharge Instruction: Apply CoFlex 2-layer compression as directed. (alt for 4 layer) Compression Stockings Add-Ons Electronic Signature(s) Signed: 11/14/2020 5:22:11 PM By: Fonnie Mu RN Signed: 11/16/2020 11:10:38 AM By: Karl Ito Entered By: Karl Ito on 11/14/2020 16:31:02 -------------------------------------------------------------------------------- Vitals Details Patient Name: Date of Service: Shawn [name], Shawn [name]. 11/14/2020 3:00 PM Medical Record Number: [id number] Patient Account Number: [id number] Date of Birth/Sex: Treating RN: 19-Nov-1980 (40 y.o. Shawn [name] Primary Care Lamin Chandley: Marcy Siren Other Clinician: Referring Eldor Conaway: Treating Densil Ottey/Extender: [name] in Treatment: 8 Vital Signs Time Taken: 16:21 Temperature (F): 98.9 Height (in): 74 Pulse (bpm): 80 Weight (lbs): 425 Respiratory Rate (breaths/min): 20 Body Mass Index (BMI):  54.6 Blood Pressure (mmHg): 152/85 Reference Range: 80 - 120 mg / dl Electronic Signature(s) Signed: 11/14/2020 5:42:35 PM By: Zandra Abts RN, BSN Entered By: Zandra Abts on 11/14/2020 16:35:30

## 2020-11-21 ENCOUNTER — Other Ambulatory Visit: Payer: Self-pay

## 2020-11-21 ENCOUNTER — Encounter (HOSPITAL_BASED_OUTPATIENT_CLINIC_OR_DEPARTMENT_OTHER): Payer: 59 | Admitting: Internal Medicine

## 2020-11-21 DIAGNOSIS — I87331 Chronic venous hypertension (idiopathic) with ulcer and inflammation of right lower extremity: Secondary | ICD-10-CM | POA: Diagnosis not present

## 2020-11-22 NOTE — Progress Notes (Signed)
[name] ([id number]) . Visit Report for 11/21/2020 Debridement Details Patient Name: Date of Service: [name] 11/21/2020 3:15 PM Medical Record Number: [id number] Patient Account Number: [id number] Date of Birth/Sex: Treating [name]: 1981-02-08 (40 y.o. [name], [name] Primary Care Provider: Marcy [name] Other Clinician: Referring Provider: Treating Provider/Extender: [name] in Treatment: 9 Debridement Performed for Assessment: Wound #32 Right,Medial Lower Leg Performed By: Physician Shawn Caul., [name] Debridement Type: Debridement Level of Consciousness (Pre-procedure): Awake and Alert Pre-procedure Verification/Time Out Yes - 15:57 Taken: Start Time: 15:57 Pain Control: Lidocaine [name] Area Debrided (L x W): otal 4.5 (cm) x 8 (cm) = 36 (cm) Tissue and other material debrided: Viable, Non-Viable, Subcutaneous, Skin: Dermis , Skin: Epidermis Level: Skin/Subcutaneous Tissue Debridement Description: Excisional Instrument: Curette Bleeding: Minimum Hemostasis Achieved: Pressure End Time: 15:57 Procedural Pain: 0 Post Procedural Pain: 0 Response to Treatment: Procedure was tolerated well Level of Consciousness (Post- Awake and Alert procedure): Post Debridement Measurements of Total Wound Length: (cm) 4.5 Width: (cm) 8 Depth: (cm) 0.3 Volume: (cm) 8.482 Character of Wound/Ulcer Post Debridement: Improved Post Procedure Diagnosis Same as Pre-procedure Electronic Signature(s) Signed: 11/21/2020 5:15:03 PM By: [name] Signed: 11/22/2020 3:40:47 PM By: [name] Entered By: [name] on 11/21/2020 16:00:19 -------------------------------------------------------------------------------- HPI Details Patient Name: Date of Service: [name], [name]. 11/21/2020 3:15 PM Medical Record Number: [id number] Patient Account Number: [id number] Date of Birth/Sex: Treating [name]: 1981/02/12 (40 y.o. [name] Primary Care Provider: Marcy [name] Other Clinician: Referring Provider: Treating Provider/Extender: [name] in Treatment: 9 History of Present Illness HPI Description: The patient is Shawn 40 yrs old bm here for evaluation of his right leg ulcer. He has an extensive history of lymphedema and ulcers. He is being treated at the [name] by [name] with [name] boots and the [name]. He has pumps at home but he is only using them once Shawn day. 08/30/14 selective debridement done of surface eschar. The wound cleans up quite nicely in the bed of this looks healthy. He is using Shawn wound VAC under an Unna wrap. 11/17/14; selective debridement done of surface eschar. Once again the wound beds at clean up quite nicely. He is no longer using Shawn wound VAC. Recent dressing changes include Hydrofera Blue. Apparently he has done Shawn wide variety of different topical dressings including advanced treatment options like Apligraf's without success. 03/21/15; surgical debridement done of surface eschar and nonviable subcutaneous tissue. The area on the medial aspect of the right leg has closed and the wound on the lateral right leg looks improved has been using Silver Collagen 04/11/15; I think attendance here is sporadic. The area on the right medial leg remains healed. He has Shawn new tiny area on the back of the right leg. Again Shawn surgical debridement of the surface eschar and nonviable subcutaneous tissue of the major wound on the right lateral leg. He has been using Silver Collagen and at home, he does his own Unna wraps at home edema control seems reasonable. 04/20/15; the area on the right medial leg has Shawn very superficial area which may not even be open nevertheless I felt needed to be dressed today [this is his original chronic wound] the new wound is on the right anterior lateral leg is underwent Shawn surgical debridement. He has Shawn small wound on the right posterior leg. He puts  on his own Unna boots, according to our nurses he  does this fairly well. We have been using Silver collagen. 05/02/2015 -- I understand in the past his venous studies have been done at [name] and he was advised weight loss before they would attempt to tackle his iliac vein blockage. He has not gone back for review. 06/27/2015 -- we have applied for Apligraf and are awaiting his insurance clearance. 07/25/2015 -- he still has to use her back from his insurance agent regarding his copayment for his Apligraf. 07/31/2015 -- the patient got Shawn reply from the insurance agent regarding the dollar payment for his application but is not sure whether it is for 5 or for one. 11/28/2015 -- has been hurting Shawn little bit more and he has had change in color of his wound especially on the medial part. 12/05/2015 -- his culture was positive for [name] and [name] which are sensitive to ciprofloxacin which he is already on. 10/3/17still on ciprofloxacin. His mother reports of dressing had to be changed last Friday due to odor. He has not been systemically unwell 12/19/15; patient came in today complaining of increasing pain and tightness in his upper right thigh. He completed the ciprofloxacin 2 or 3 days ago. He is not running Shawn fever today. 12/26/2015 -- last week he had been seen by [name] who got Shawn lower extremity venous duplex evaluation done which did not show DVT or SVT in the right lower extremity. he had also put him on Augmentin in addition to the previous ciprofloxacin and he had received for 2 weeks 01/02/2016 -- -- was admitted to the hospital on 12/26/2015 and discharged on 12/29/2015 and was treated for cellulitis. He was also newly diagnosed with type 2 diabetes mellitus and outpatient monitoring and initiation of treatment was recommended. Patients hemoglobin A1c was 6.6 No osteomyelitis detected on x-rays and recent Doppler study was negative for DVT Oral doxycycline and Levaquin  were recommended for the patient as an . outpatient for Shawn 14 day treatment. IV Zosyn and vancomycin was given due to concerns of Pseudomonas infection while he was in hospital. 02/13/2016 -- he has not gone back to [facility] where he had his vascular opinion earlier and I have urged him to regroup with them to see if there is any surgical options available. 02/27/2016 -- has an appointment to see [name] vascular surgeons on January 3 and may be seeing Dr. Verdie Drown 03/19/2016 -- I was not able to find any notes on Epic but the patient did go to the vascular clinic at [facility] and saw the PA to Dr. Verdie Drown. I understand she has recommended Shawn CT scan and MRI to be done on February 1 and they would review this with him on the same day. 04/09/2016 -- was admitted to the hospital on 03/20/2016 acute respiratory failure with hypoxia, abdominal discomfort with erythema, hypertensive urgency and chronic wound of the right leg with morbid obesity. he was discharged home on 04/05/2016 and was to continue on IV antibiotics for 18 more days follow-up with the wound center and continue with his cardiologist. he has lost approximately 130 pounds and diuresed over 48 L. He was treated with IV Zosyn and ID recommended he continue this for 3 weeks more. The notes from [facility] wound clinic were noted where he was seen by the PA and she had taken cultures which grew MSSA and Pseudomonas. She had recommended edema control with Shawn 4-layer compression and also referred him to the lymphedema clinic.  Shawn CT venogram was also planned for the future. 04/16/2016 -- at [facility] Shawn CT pelvic venogram runoff was done on 04/11/2016 -- IMPRESSION:-Limited study secondary to poor opacification of venous structures. No definite evidence of acute deep vein thrombosis. 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 -Extensive right iliac chain and right inguinal adenopathy, favored to be reactive/secondary to congestion. The patient continues on his IV antibiotics through his PICC line and has his cardiology opinion and also Shawn bariatric surgery opinion coming up. 04/30/2016 -- He has completed his IV antibiotics through his PICC line. 05/14/2016 --he was seen by the PA, Ms Sluss, recommended alternate day dressing with compression and use Hibiclens and Dakin's solution with acetic acid on his wounds. 07/30/2016 -- he has still not got his custom-made compression stockings and I have urged him to go and get him self measured for these. 08/06/2016 -- he has been measured for his custom stocking and will get in 2 weeks. He has lost 20 pounds since March 08/27/2016 -- he has not got his custom stockings yet and he tried another pair which has not fit well and he has had Shawn lot of maceration increase the size of his wound. 09/03/2016 -- the patient says that he has not been very compliant with his dressing changes and his elevation and exercise and this week he is notices wound get rather large with maceration. 09/18/16; according to our intake nurse the measurements on this patient's wound are slightly larger. I note previous compliance concerns. I note that his wounds measured larger last week which was noted by Dr. Meyer Russel. Culture done last week was negative. 10/01/2016 -- the patient has received some lymphedema pumps which are new and he says he is being compliant with this. He is going to be away for 2 weeks on vacation to [facility] 10/15/2016 -- he returns after 2 weeks and tells me he has been diligent with his dressing and has lost 25 pounds over the last 3 months. 10/22/2016 -- his last hemoglobin A1c was 6.2 and he is now diet controlled. 11/19/2016 -- he has been having problems with his compression stockings which are custom made at [facility]. He has not yet been able to get them. He  had taken out his compression because he had gone there and had no dressing on when he came here today READMISSION 05/22/2018 [name] is Shawn now 40 year old man with Shawn Hertenstein history of wounds in his lower extremities secondary to chronic venous insufficiency with secondary lymphedema. He has had previous vein ligations on the right although I do not have this information in front of me. As I understand things he also has central venous obstruction with Shawn vein bypass in 2005 I believe this was done at [facility]. He tells me over the last 2 months he has noted mid reopening in the right mid anterior lower extremity. This is Shawn rectangular shaped wound which is kind of odd in terms of how that formed. He has been using silver alginate and Unna boots that we used on him when he was here in 2018. He buys his product supply online thinks this is cheaper than ordering through intermediary's Past medical history; CHF, morbid obesity, hypertension, hyperlipidemia, chronic venous insufficiency, vein bypass in 2005 previous vein ablations or ligations. He has Shawn stent in the right lower extremity. ABI in this clinic was 1.04. He is using his compression pumps  at home 3/27; 2-week follow-up. Right lower extremity wounds related to chronic venous insufficiency and lymphedema. He has been using silver alginate under an Radio broadcast assistant. He change the [employer] himself at home last week. He is making nice progress 4/10; 2-week follow-up. Right lower extremity wound is just about closed Shawn small open area in the middle of the and large rectangular wound he had at the beginning. His edema control is excellent. He says he is using his compression pumps religiously 4/17; 2-week follow-up. Right lower extremity wound is totally closed. He had Shawn large rectangular wound which is progressively closed. His edema control is very good. He is using his compression pumps once to twice Shawn day READMISSION 09/19/2020 [name] is now Shawn 40 year old  man we have had for several stays in this clinic including 2017, 2018 and most recently in 2020 with Shawn wound on his right lower leg. He has compression pumps which he claims to be using twice Shawn day. He also has stockings however I am not sure how much he uses them. Things have broken down over the last month or 2 he has an extensive wound area on the right mid tibial area I think this is similar to what he has had in the past. He has been using Xeroform and an Ace wrap that his girlfriend is applying. Past medical history; the patient has known chronic venous insufficiency with secondary lymphedema. He had an iliac vein blockage and he had Shawn bypass at [facility] although the patient is not followed up with them. The surgeon may have been Dr. Jacolyn Reedy. As noted he has lymphedema pumps. Type 2 diabetes congestive heart failure obstructive sleep apnea. He has Shawn history of Shawn right leg DVT although this may have been the iliac vein he is not currently on anticoagulation ABI in our clinic is 1.37 on the right Imaging Results - in this encounter CT Pelvic Venogram Run Off Imaging Results - CT Pelvic Venogram Run Off Impressions Performed At -Limited study secondary to poor opacification of venous structures. Nodefinite evidence of acute deep vein thrombosis. 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 -Extensive right iliac chain and right inguinal adenopathy, favored to bereactive/secondary to congestion. [facility] Imaging Results - CT Pelvic Venogram Run Off Narrative Performed At EXAM: CT PELVIC VENOGRAM RUN OFF DATE: 04/11/2016 1:28 PM ACCESSION: [id number] UN DICTATED: 04/11/2016 2:18 PM INTERPRETATION LOCATION: [facility] CLINICAL INDICATION: 40 years old Male with h/o RLE DVT and massive lymphedema of both LE's. Eval for venous outflow obstruction vs extrinsic mass- L97.203-Skin ulcer of calf with necrosis  of muscle, unspecified laterality(RAF-HCC) COMPARISON: CT abdomen pelvis dated 02/11/2007 TECHNIQUE: Shawn spiral CT scan was obtained approximately 3 minutes after administration of IV contrast from the kidneys to the knees.Images were reconstructed in the axial plane. Multiplanar reformatted and MIP images were provided for further evaluation of the vessels. For selected cases, 3D volumerendered images are also provided. VASCULAR FINDINGS: There is overall poor contrast opacification of the venous structures, limitingevaluation. IVC: No evidence of thrombus. Iliac veins: The right iliac veins are chronically occluded/diminutive in size. There are extensive venous collaterals noted throughout the right pelvis extending into the right lower extremity. The number and caliber of the venous collaterals have increased when compared to 02/11/2007. The left iliac veins appear patent. There are also small caliber venous collaterals within the left pelvis extending into the left lower extremity. Anterior abdominal wall venouscollaterals  are also noted but fully imaged secondary to patient diameter. The bilateral femoral and popliteal veins appear patent. There is no evidence of acute thrombus. The arterial vasculature is unremarkable on this venous phase time study. NONVASCULAR FINDINGS: LOWER CHEST Unremarkable. : ABDOMEN: HEPATOBILIARY: Unremarkable liver. No biliary ductal dilatation. Gallbladder isremarkable. PANCREAS: Unremarkable. SPLEEN: Unremarkable. ADRENAL GLANDS: Unremarkable. KIDNEYS/URETERS: Unremarkable. BLADDER: Unremarkable. BOWEL/PERITONEUM/RETROPERITONEUM: No bowel obstruction. No acute inflammatoryprocess. No ascites. LYMPH NODES: Extensive right iliac chain and right inguinal adenopathy. Shawn nodal conglomerate in the right inguinal region measures 6.8 x 3.4 cm (2:124). This is only slightly larger when compared to 02/11/2007. Given interval stabilityover 10 years, these are favored to  be reactive/secondary to congestion. REPRODUCTIVE: Unremarkable. BONES/SOFT TISSUES: No acute osseous abnormalities. Postsurgical changes in the right inguinal and left medial thigh region. Right greater than left softtissue edema throughout the lower extremities. [facility] Imaging Results - CT Pelvic Venogram Run Off Procedure Note Interface, Rad Results In - 04/11/2016 3:28 PM EST EXAM: CT PELVIC VENOGRAM RUN OFF DATE: 04/11/2016 1:28 PM ACCESSION: [id number] UN DICTATED: 04/11/2016 2:18 PM INTERPRETATION LOCATION: [facility] CLINICAL INDICATION: 40 years old Male with h/o RLE DVT and massive lymphedema of both LE's. Eval for venous outflow obstruction vs extrinsic mass- L97.203-Skin ulcer of calf with necrosis of muscle, unspecified laterality (RAF-HCC) COMPARISON: CT abdomen pelvis dated 02/11/2007 TECHNIQUE: Shawn spiral CT scan was obtained approximately 3 minutes after administration of IV contrast from the kidneys to the knees. Images were reconstructed in the axial plane. Multiplanar reformatted and MIP images were provided for further evaluation of the vessels. For selected cases, 3D volume rendered images are also provided. VASCULAR FINDINGS: There is overall poor contrast opacification of the venous structures, limiting evaluation. IVC: No evidence of thrombus. Iliac veins: The right iliac veins are chronically occluded/diminutive in size. There are extensive venous collaterals noted throughout the right pelvis extending into the right lower extremity. The number and caliber of the venous collaterals have increased when compared to 02/11/2007. The left iliac veins appear patent. There are also small caliber venous collaterals within the left pelvis extending into the left lower extremity. Anterior abdominal wall venous collaterals are also noted but fully imaged secondary to patient diameter. The bilateral femoral and popliteal veins appear patent. There is no evidence of acute  thrombus. The arterial vasculature is unremarkable on this venous phase time study. NONVASCULAR FINDINGS: LOWER CHEST Unremarkable. : ABDOMEN: HEPATOBILIARY: Unremarkable liver. No biliary ductal dilatation. Gallbladder is remarkable. PANCREAS: Unremarkable. SPLEEN: Unremarkable. ADRENAL GLANDS: Unremarkable. KIDNEYS/URETERS: Unremarkable. BLADDER: Unremarkable. BOWEL/PERITONEUM/RETROPERITONEUM: No bowel obstruction. No acute inflammatory process. No ascites. LYMPH NODES: Extensive right iliac chain and right inguinal adenopathy. Shawn nodal conglomerate in the right inguinal region measures 6.8 x 3.4 cm (2:124). This is only slightly larger when compared to 02/11/2007. Given interval stability over 10 years, these are favored to be reactive/secondary to congestion. REPRODUCTIVE: Unremarkable. BONES/SOFT TISSUES: No acute osseous abnormalities. Postsurgical changes in the right inguinal and left medial thigh region. Right greater than left soft tissue edema throughout the lower extremities. IMPRESSION: -Limited study secondary to poor opacification of venous structures. No definite evidence of acute deep vein thrombosis. 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 -Extensive right iliac chain and right inguinal adenopathy, favored to be reactive/secondary to congestion. Imaging Results - CT Pelvic Venogram Run Off Performing Organization Address City/State/Zipcode Phone Number [facility] [name] [place], [place] [address] Surgical summary as listed  below; Assessment: [name] is Shawn 40 y.o. male with Araque history of right lower extremity chronic obstructive deep venous disease with recurrent ulceration, absence of right iliac vein, prior left to right femoral vein bypass. Patient continues with recurrent ulceration, his weight precludes him from further vascular studies at this time. If he is able  to lose 50-100 pounds we would be able to do left lower extremity venogram possible intervention with 50% chance of re- opening occlusion per Dr. Jacolyn Reedy. For global health, weight loss, potential improvement in his chronic venous insufficiency would appreciateinput from bariatric surgery group for recommendations. 09/26/20; patient comes in today with not much improvement. He has also some odor. 3 open areas and this linear area in his mid calf require debridement. We have been using silver alginate under compression. I gave him Keflex last week for what I thought was cellulitis in the right medial thigh he is completing this and I think this is better. 7/26; patient comes in today with again necrotic debris over these wounds. According to our intake nurse extreme malodor and drainage. We have been using silver alginate under compression. He is complaining about pain in the wound area. 7/29; Patient presents for follow-up. He reports improvement to the wound. He has finished taking Keflex. He has no issues or complaints today. 10/17/2020 upon evaluation today patient appears to be doing poorly in regard to his leg ulcer. He has been tolerating the dressing changes without complication. Fortunately there does not appear to be any signs of active infection at this time. No fevers, chills, nausea, vomiting, or diarrhea. 8/23; the patient has 2 areas anteriorly and Shawn larger area medially on the right lower leg. He comes in today with his compression wrap slipping down. We do not have good edema control. We have been using silver alginate. I believe he completed Shawn course of antibioticso Levaquin given to him 2 weeks ago at his last visit. He states his girlfriend who is Shawn nurse changes his dressings. We have not seen this in 2 weeks. He just received Shawn compounded topical antibiotic based on I believe Shawn deep tissue culture that was done. I do not have Shawn result of the deep tissue culture today and he forgot the  compounded antibiotic. I will try to clarify this next time I tried to send him back to vascular at [facility] for review of his central venous obstruction although they would not even see him I think because of noncompliance, or at least perceived noncompliance with regards to the recommendations for weight loss, consideration of bariatric assessment for surgery etc. 8/30; although the patient's wounds anteriorly on the right lower leg extending medially were debrided today. Better looking wound surface he has better edema control still under 4-layer compression. He tells me he is using his compression pump twice Shawn day however they are greater than 67 years old and he might benefit from Shawn new set. He is using his Jodie Echevaria compounded antibiotic Since the last time he was here he saw Dr. Durwin Nora at vein and vascular at our request. He was felt to have adequate arterial flow for healing although there was still the issue about whether he might need an angiogram if things still. 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 He did not talk about the s previous central venous procedures that have been done at [facility]. That information is available in care everywhere however based on the duplex exam he was not felt  to be Shawn candidate for any venous procedures on the right. He has no significant venous reflux on the right and has had previously had Shawn right greater saphenous vein ablated 9/6; the patient has Shawn cluster of small areas over the right anterior mid tibia as well as an area medially. That the air 1 medially is more confluent all of them look like they have Shawn better surface. We have been putting him in 4-layer compression and using his compression pumps twice Shawn day 9/13; both of the patient's wound areas appear somewhat better especially the large area medially. Electronic Signature(s) Signed: 11/22/2020 3:40:47 PM By: [name] Entered By: [name] on 11/21/2020  16:25:20 -------------------------------------------------------------------------------- Physical Exam Details Patient Name: Date of Service: [name], [name]. 11/21/2020 3:15 PM Medical Record Number: [id number] Patient Account Number: [id number] Date of Birth/Sex: Treating [name]: 1980-10-05 (40 y.o. [name] Primary Care Provider: Marcy [name] Other Clinician: Referring Provider: Treating Provider/Extender: [name] in Treatment: 9 Notes Wound exam; cluster of wounds anteriorly in the larger wound medially. All of these look somewhat better. Under illumination the area medially had again 80% fibrinous debris I used Shawn #5 curette to remove this. Electronic Signature(s) Signed: 11/22/2020 3:40:47 PM By: [name] Entered By: [name] on 11/21/2020 16:25:59 -------------------------------------------------------------------------------- Physician Orders Details Patient Name: Date of Service: [name], [name]. 11/21/2020 3:15 PM Medical Record Number: [id number] Patient Account Number: [id number] Date of Birth/Sex: Treating [name]: 29-Dec-1980 (40 y.o. [name], [name] Primary Care Provider: Marcy [name] Other Clinician: Referring Provider: Treating Provider/Extender: [name] in Treatment: 9 Verbal / Phone Orders: No Diagnosis Coding Follow-up Appointments ppointment in 1 week. - [name] Return Shawn Bathing/ Shower/ Hygiene May shower with protection but do not get wound dressing(s) wet. - May use Shawn cast wrap to put over wraps to shower; you can buy these at [facility] or [facility] Edema Control - Lymphedema / SCD / Other Lymphedema Pumps. Use Lymphedema pumps on leg(s) 2-3 times Shawn day for 45-60 minutes. If wearing any wraps or hose, do not remove them. Continue exercising as instructed. Elevate legs to the level of the heart or above for 30 minutes daily and/or when sitting, Shawn frequency  of: - throughout the day Avoid standing for Collar periods of time. Patient to wear own compression stockings every day. - on Left Leg Wound Treatment Wound #32 - Lower Leg Wound Laterality: Right, Medial Cleanser: Soap and Water 2 x Per Week/15 Days Discharge Instructions: May shower and wash wound with dial antibacterial soap and water prior to dressing change. Cleanser: Wound Cleanser (Generic) 2 x Per Week/15 Days Discharge Instructions: Cleanse the wound with wound cleanser prior to applying Shawn clean dressing using gauze sponges, not tissue or cotton balls. Peri-Wound Care: Triamcinolone 15 (g) 2 x Per Week/15 Days Discharge Instructions: Use triamcinolone 15 (g) as directed Peri-Wound Care: Sween Lotion (Moisturizing lotion) 2 x Per Week/15 Days Discharge Instructions: Apply moisturizing lotion as directed Topical: Keystone antibiotic gel 2 x Per Week/15 Days Discharge Instructions: Apply directly to wound bed, under primary dressing Prim Dressing: Hydrofera Blue Classic Foam, 4x4 in (Generic) 2 x Per Week/15 Days ary Discharge Instructions: Moisten with saline prior to applying to wound bed Secondary Dressing: ABD Pad, 5x9 (Generic) 2 x Per Week/15 Days Discharge Instructions: Apply over primary dressing as directed. Secondary Dressing: Zetuvit Plus 4x8 in (Generic) 2 x Per Week/15 Days Discharge Instructions: Apply over  primary dressing as directed. Secondary Dressing: CarboFLEX Odor Control Dressing, 4x4 in (Generic) 2 x Per Week/15 Days Discharge Instructions: Apply over primary dressing as directed. Compression Wrap: CoFlex TLC XL 2-layer Compression System 4x7 (in/yd) 2 x Per Week/15 Days Discharge Instructions: Apply CoFlex 2-layer compression as directed. (alt for 4 layer) Wound #34 - Lower Leg Wound Laterality: Right, Anterior Cleanser: Soap and Water 2 x Per Week/15 Days Discharge Instructions: May shower and wash wound with dial antibacterial soap and water prior to dressing  change. Cleanser: Wound Cleanser (Generic) 2 x Per Week/15 Days Discharge Instructions: Cleanse the wound with wound cleanser prior to applying Shawn clean dressing using gauze sponges, not tissue or cotton balls. Peri-Wound Care: Triamcinolone 15 (g) 2 x Per Week/15 Days Discharge Instructions: Use triamcinolone 15 (g) as directed Peri-Wound Care: Sween Lotion (Moisturizing lotion) 2 x Per Week/15 Days Discharge Instructions: Apply moisturizing lotion as directed Topical: Keystone antibiotic gel 2 x Per Week/15 Days Discharge Instructions: Apply directly to wound bed, under primary dressing Prim Dressing: Hydrofera Blue Classic Foam, 4x4 in (Generic) 2 x Per Week/15 Days ary Discharge Instructions: Moisten with saline prior to applying to wound bed Secondary Dressing: ABD Pad, 5x9 (Generic) 2 x Per Week/15 Days Discharge Instructions: Apply over primary dressing as directed. Secondary Dressing: Zetuvit Plus 4x8 in (Generic) 2 x Per Week/15 Days Discharge Instructions: Apply over primary dressing as directed. Secondary Dressing: CarboFLEX Odor Control Dressing, 4x4 in (Generic) 2 x Per Week/15 Days Discharge Instructions: Apply over primary dressing as directed. Compression Wrap: CoFlex TLC XL 2-layer Compression System 4x7 (in/yd) 2 x Per Week/15 Days Discharge Instructions: Apply CoFlex 2-layer compression as directed. (alt for 4 layer) Electronic Signature(s) Signed: 11/21/2020 5:15:03 PM By: [name] Signed: 11/22/2020 3:40:47 PM By: [name] Entered By: [name] on 11/21/2020 15:45:46 -------------------------------------------------------------------------------- Problem List Details Patient Name: Date of Service: [name], Georgeann Oppenheim [name]. 11/21/2020 3:15 PM Medical Record Number: [id number] Patient Account Number: [id number] Date of Birth/Sex: Treating [name]: January 25, 1981 (40 y.o. [name], [name] Primary Care Provider: Marcy [name] Other  Clinician: Referring Provider: Treating Provider/Extender: [name] in Treatment: 9 Active Problems ICD-10 Encounter Code Description Active Date MDM Diagnosis I87.331 Chronic venous hypertension (idiopathic) with ulcer and inflammation of right 09/19/2020 No Yes lower extremity I89.0 Lymphedema, not elsewhere classified 09/19/2020 No Yes L97.818 Non-pressure chronic ulcer of other part of right lower leg with other specified 09/19/2020 No Yes severity Inactive Problems Resolved Problems Electronic Signature(s) Signed: 11/22/2020 3:40:47 PM By: [name] Entered By: [name] on 11/21/2020 16:24:07 -------------------------------------------------------------------------------- Progress Note Details Patient Name: Date of Service: [name], Georgeann Oppenheim [name]. 11/21/2020 3:15 PM Medical Record Number: [id number] Patient Account Number: [id number] Date of Birth/Sex: Treating [name]: [birth year] (40 y.o. [name] Primary Care Provider: Marcy [name] Other Clinician: Referring Provider: Treating Provider/Extender: [name] in Treatment: 9 Subjective History of Present Illness (HPI) The patient is Shawn 40 yrs old bm here for evaluation of his right leg ulcer. He has an extensive history of lymphedema and ulcers. He is being treated at the [facility] [name] by [name] with [name] boots and the [name]. He has pumps at home but he is only using them once Shawn day. 08/30/14 selective debridement done of surface eschar. The wound cleans up quite nicely in the bed of this looks healthy. He is using Shawn wound VAC under an Unna wrap. 11/17/14; selective debridement done of surface eschar. Once again  the wound beds at clean up quite nicely. He is no longer using Shawn wound VAC. Recent dressing changes include Hydrofera Blue. Apparently he has done Shawn wide variety of different topical dressings including advanced treatment options like Apligraf's  without success. 03/21/15; surgical debridement done of surface eschar and nonviable subcutaneous tissue. The area on the medial aspect of the right leg has closed and the wound on the lateral right leg looks improved has been using Silver Collagen 04/11/15; I think attendance here is sporadic. The area on the right medial leg remains healed. He has Shawn new tiny area on the back of the right leg. Again Shawn surgical debridement of the surface eschar and nonviable subcutaneous tissue of the major wound on the right lateral leg. He has been using Silver Collagen and at home, he does his own Unna wraps at home edema control seems reasonable. 04/20/15; the area on the right medial leg has Shawn very superficial area which may not even be open nevertheless I felt needed to be dressed today [this is his original chronic wound] the new wound is on the right anterior lateral leg is underwent Shawn surgical debridement. He has Shawn small wound on the right posterior leg. He puts on his own Unna boots, according to our nurses he does this fairly well. We have been using Silver collagen. 05/02/2015 -- I understand in the past his venous studies have been done at [facility] [name] and he was advised weight loss before they would attempt to tackle his iliac vein blockage. He has not gone back for review. 06/27/2015 -- we have applied for Apligraf and are awaiting his insurance clearance. 07/25/2015 -- he still has to use her back from his insurance agent regarding his copayment for his Apligraf. 07/31/2015 -- the patient got Shawn reply from the insurance agent regarding the dollar payment for his application but is not sure whether it is for 5 or for one. 11/28/2015 -- has been hurting Shawn little bit more and he has had change in color of his wound especially on the medial part. 12/05/2015 -- his culture was positive for [name] and [name] which are sensitive to ciprofloxacin which he is already on. 12/12/15oostill on  ciprofloxacin. His mother reports of dressing had to be changed last Friday due to odor. He has not been systemically unwell 12/19/15; patient came in today complaining of increasing pain and tightness in his upper right thigh. He completed the ciprofloxacin 2 or 3 days ago. He is not running Shawn fever today. 12/26/2015 -- last week he had been seen by [name] who got Shawn lower extremity venous duplex evaluation done which did not show DVT or SVT in the right lower extremity. he had also put him on Augmentin in addition to the previous ciprofloxacin and he had received for 2 weeks 01/02/2016 -- -- was admitted to the hospital on 12/26/2015 and discharged on 12/29/2015 and was treated for cellulitis. He was also newly diagnosed with type 2 diabetes mellitus and outpatient monitoring and initiation of treatment was recommended. Patientoos hemoglobin A1c was 6.6 No osteomyelitis detected on x-rays and recent Doppler study was negative for DVT Oral doxycycline and Levaquin were recommended for the patient as an . outpatient for Shawn 14 day treatment. IV Zosyn and vancomycin was given due to concerns of Pseudomonas infection while he was in hospital. 02/13/2016 -- he has not gone back to [facility] where he had his vascular opinion earlier and I have urged him  to regroup with them to see if there is any surgical options available. 02/27/2016 -- has an appointment to see [facility] vascular surgeons on January 3 and may be seeing Dr. Verdie Drown 03/19/2016 -- I was not able to find any notes on Epic but the patient did go to the vascular clinic at [facility] and saw the PA to Dr. Verdie Drown. I understand she has recommended Shawn CT scan and MRI to be done on February 1 and they would review this with him on the same day. 04/09/2016 -- was admitted to the hospital on 03/20/2016 acute respiratory failure with hypoxia, abdominal discomfort with erythema, hypertensive urgency and chronic wound of  the right leg with morbid obesity. he was discharged home on 04/05/2016 and was to continue on IV antibiotics for 18 more days follow-up with the wound center and continue with his cardiologist. he has lost approximately 130 pounds and diuresed over 48 L. He was treated with IV Zosyn and ID recommended he continue this for 3 weeks more. The notes from [facility] wound clinic were noted where he was seen by the PA and she had taken cultures which grew MSSA and Pseudomonas. She had recommended edema control with Shawn 4-layer compression and also referred him to the lymphedema clinic. Shawn CT venogram was also planned for the future. 04/16/2016 -- at [facility] Shawn CT pelvic venogram runoff was done on 04/11/2016 -- IMPRESSION:-Limited study secondary to poor opacification of venous structures. No definite evidence of acute deep vein thrombosis. 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 -Extensive right iliac chain and right inguinal adenopathy, favored to be reactive/secondary to congestion. The patient continues on his IV antibiotics through his PICC line and has his cardiology opinion and also Shawn bariatric surgery opinion coming up. 04/30/2016 -- He has completed his IV antibiotics through his PICC line. 05/14/2016 --he was seen by the PA, Ms Sluss, recommended alternate day dressing with compression and use Hibiclens and Dakin's solution with acetic acid on his wounds. 07/30/2016 -- he has still not got his custom-made compression stockings and I have urged him to go and get him self measured for these. 08/06/2016 -- he has been measured for his custom stocking and will get in 2 weeks. He has lost 20 pounds since March 08/27/2016 -- he has not got his custom stockings yet and he tried another pair which has not fit well and he has had Shawn lot of maceration increase the size of his wound. 09/03/2016 -- the patient says that  he has not been very compliant with his dressing changes and his elevation and exercise and this week he is notices wound get rather large with maceration. 09/18/16; according to our intake nurse the measurements on this patient's wound are slightly larger. I note previous compliance concerns. I note that his wounds measured larger last week which was noted by Dr. Meyer Russel. Culture done last week was negative. 10/01/2016 -- the patient has received some lymphedema pumps which are new and he says he is being compliant with this. He is going to be away for 2 weeks on vacation to [facility] 10/15/2016 -- he returns after 2 weeks and tells me he has been diligent with his dressing and has lost 25 pounds over the last 3 months. 10/22/2016 -- his last hemoglobin A1c was 6.2 and he is now diet controlled. 11/19/2016 -- he has been having problems with his  compression stockings which are custom made at [facility]. He has not yet been able to get them. He had taken out his compression because he had gone there and had no dressing on when he came here today READMISSION 05/22/2018 Mr. Brabec is Shawn now 40 year old man with Shawn [place] history of wounds in his lower extremities secondary to chronic venous insufficiency with secondary lymphedema. He has had previous vein ligations on the right although I do not have this information in front of me. 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 He tells me over the last 2 months he has noted mid reopening in the right mid anterior lower extremity. This is Shawn rectangular shaped wound which is kind of odd in terms of how that formed. He has been using silver alginate and Unna boots that we used on him when he was here in 2018. He buys his product supply online thinks this is cheaper than ordering through intermediary's Past medical history; CHF, morbid obesity, hypertension, hyperlipidemia, chronic  venous insufficiency, vein bypass in 2005 previous vein ablations or ligations. He has Shawn stent in the right lower extremity. ABI in this clinic was 1.04. He is using his compression pumps at home 3/27; 2-week follow-up. Right lower extremity wounds related to chronic venous insufficiency and lymphedema. He has been using silver alginate under an Radio broadcast assistant. He change the [employer] himself at home last week. He is making nice progress 4/10; 2-week follow-up. Right lower extremity wound is just about closed Shawn small open area in the middle of the and large rectangular wound he had at the beginning. His edema control is excellent. He says he is using his compression pumps religiously 4/17; 2-week follow-up. Right lower extremity wound is totally closed. He had Shawn large rectangular wound which is progressively closed. His edema control is very good. He is using his compression pumps once to twice Shawn day READMISSION 09/19/2020 [name] is now Shawn 40 year old man we have had for several stays in this clinic including 2017, 2018 and most recently in 2020 with Shawn wound on his right lower leg. He has compression pumps which he claims to be using twice Shawn day. He also has stockings however I am not sure how much he uses them. Things have broken down over the last month or 2 he has an extensive wound area on the right mid tibial area I think this is similar to what he has had in the past. He has been using Xeroform and an Ace wrap that his girlfriend is applying. Past medical history; the patient has known chronic venous insufficiency with secondary lymphedema. He had an iliac vein blockage and he had Shawn bypass at [facility] although the patient is not followed up with them. The surgeon may have been Dr. Jacolyn Reedy. As noted he has lymphedema pumps. Type 2 diabetes congestive heart failure obstructive sleep apnea. He has Shawn history of Shawn right leg DVT although this may have been the iliac vein he is not currently  on anticoagulation ABI in our clinic is 1.37 on the right Imaging Results - in this encounter CT Pelvic Venogram Run Off Imaging Results - CT Pelvic Venogram Run Off Impressions Performed At -Limited study secondary to poor opacification of venous structures. Nodefinite evidence of acute deep vein thrombosis. 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 -  Extensive right iliac chain and right inguinal adenopathy, favored to bereactive/secondary to congestion. [facility] Imaging Results - CT Pelvic Venogram Run Off Narrative Performed At EXAM: CT PELVIC VENOGRAM RUN OFF DATE: 04/11/2016 1:28 PM ACCESSION: [id number] UN DICTATED: 04/11/2016 2:18 PM INTERPRETATION LOCATION: [facility] CLINICAL INDICATION: 40 years old Male with h/o RLE DVT and massive lymphedema of both LE's. Eval for venous outflow obstruction vs extrinsic mass- L97.203-Skin ulcer of calf with necrosis of muscle, unspecified laterality(RAF-HCC) COMPARISON: CT abdomen pelvis dated 02/11/2007 TECHNIQUE: Shawn spiral CT scan was obtained approximately 3 minutes after administration of IV contrast from the kidneys to the knees. Images were reconstructed in the axial plane. Multiplanar reformatted and MIP images were provided for further evaluation of the vessels. For selected cases, 3D volumerendered images are also provided. VASCULAR FINDINGS: There is overall poor contrast opacification of the venous structures, limitingevaluation. IVC: No evidence of thrombus. Iliac veins: The right iliac veins are chronically occluded/diminutive in size. There are extensive venous collaterals noted throughout the right pelvis extending into the right lower extremity. The number and caliber of the venous collaterals have increased when compared to 02/11/2007. The left iliac veins appear patent. There are also small caliber venous collaterals within the left  pelvis extending into the left lower extremity. Anterior abdominal wall venouscollaterals are also noted but fully imaged secondary to patient diameter. The bilateral femoral and popliteal veins appear patent. There is no evidence of acute thrombus. The arterial vasculature is unremarkable on this venous phase time study. NONVASCULAR FINDINGS: LOWER CHEST Unremarkable. : ABDOMEN: HEPATOBILIARY: Unremarkable liver. No biliary ductal dilatation. Gallbladder isremarkable. PANCREAS: Unremarkable. SPLEEN: Unremarkable. ADRENAL GLANDS: Unremarkable. KIDNEYS/URETERS: Unremarkable. BLADDER: Unremarkable. BOWEL/PERITONEUM/RETROPERITONEUM: No bowel obstruction. No acute inflammatoryprocess. No ascites. LYMPH NODES: Extensive right iliac chain and right inguinal adenopathy. Shawn nodal conglomerate in the right inguinal region measures 6.8 x 3.4 cm (2:124). This is only slightly larger when compared to 02/11/2007. Given interval stabilityover 10 years, these are favored to be reactive/secondary to congestion. REPRODUCTIVE: Unremarkable. BONES/SOFT TISSUES: No acute osseous abnormalities. Postsurgical changes in the right inguinal and left medial thigh region. Right greater than left softtissue edema throughout the lower extremities. [facility] Imaging Results - CT Pelvic Venogram Run Off Procedure Note Interface, Rad Results In - 04/11/2016 3:28 PM EST EXAM: CT PELVIC VENOGRAM RUN OFF DATE: 04/11/2016 1:28 PM ACCESSION: [id number] UN DICTATED: 04/11/2016 2:18 PM INTERPRETATION LOCATION: [facility] CLINICAL INDICATION: 40 years old Male with h/o RLE DVT and massive lymphedema of both LE's. Eval for venous outflow obstruction vs extrinsic mass- L97.203-Skin ulcer of calf with necrosis of muscle, unspecified laterality (RAF-HCC) COMPARISON: CT abdomen pelvis dated 02/11/2007 TECHNIQUE: Shawn spiral CT scan was obtained approximately 3 minutes after administration of IV contrast from the kidneys to the knees.  Images were reconstructed in the axial plane. Multiplanar reformatted and MIP images were provided for further evaluation of the vessels. For selected cases, 3D volume rendered images are also provided. VASCULAR FINDINGS: There is overall poor contrast opacification of the venous structures, limiting evaluation. IVC: No evidence of thrombus. Iliac veins: The right iliac veins are chronically occluded/diminutive in size. There are extensive venous collaterals noted throughout the right pelvis extending into the right lower extremity. The number and caliber of the venous collaterals have increased when compared to 02/11/2007. The left iliac veins appear patent. There are also small caliber venous collaterals within the left pelvis extending into the left lower extremity. Anterior abdominal wall venous collaterals are also noted but fully imaged secondary to  patient diameter. The bilateral femoral and popliteal veins appear patent. There is no evidence of acute thrombus. The arterial vasculature is unremarkable on this venous phase time study. NONVASCULAR FINDINGS: LOWER CHEST Unremarkable. : ABDOMEN: HEPATOBILIARY: Unremarkable liver. No biliary ductal dilatation. Gallbladder is remarkable. PANCREAS: Unremarkable. SPLEEN: Unremarkable. ADRENAL GLANDS: Unremarkable. KIDNEYS/URETERS: Unremarkable. BLADDER: Unremarkable. BOWEL/PERITONEUM/RETROPERITONEUM: No bowel obstruction. No acute inflammatory process. No ascites. LYMPH NODES: Extensive right iliac chain and right inguinal adenopathy. Shawn nodal conglomerate in the right inguinal region measures 6.8 x 3.4 cm (2:124). This is only slightly larger when compared to 02/11/2007. Given interval stability over 10 years, these are favored to be reactive/secondary to congestion. REPRODUCTIVE: Unremarkable. BONES/SOFT TISSUES: No acute osseous abnormalities. Postsurgical changes in the right inguinal and left medial thigh region. Right greater than left  soft tissue edema throughout the lower extremities. IMPRESSION: -Limited study secondary to poor opacification of venous structures. No definite evidence of acute deep vein thrombosis. 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 -Extensive right iliac chain and right inguinal adenopathy, favored to be reactive/secondary to congestion. Imaging Results - CT Pelvic Venogram Run Off Performing Organization Address City/State/Zipcode Phone Number [facility] [name] [place], [place] [address] Surgical summary as listed below; Assessment: [name] is Shawn 40 y.o. male with Seeley history of right lower extremity chronic obstructive deep venous disease with recurrent ulceration, absence of right iliac vein, prior left to right femoral vein bypass. Patient continues with recurrent ulceration, his weight precludes him from further vascular studies at this time. If he is able to lose 50-100 pounds we would be able to do left lower extremity venogram possible intervention with 50% chance of re- opening occlusion per Dr. Jacolyn Reedy. For global health, weight loss, potential improvement in his chronic venous insufficiency would appreciateinput from bariatric surgery group for recommendations. 09/26/20; patient comes in today with not much improvement. He has also some odor. 3 open areas and this linear area in his mid calf require debridement. We have been using silver alginate under compression. I gave him Keflex last week for what I thought was cellulitis in the right medial thigh he is completing this and I think this is better. 7/26; patient comes in today with again necrotic debris over these wounds. According to our intake nurse extreme malodor and drainage. We have been using silver alginate under compression. He is complaining about pain in the wound area. 7/29; Patient presents for follow-up. He reports  improvement to the wound. He has finished taking Keflex. He has no issues or complaints today. 10/17/2020 upon evaluation today patient appears to be doing poorly in regard to his leg ulcer. He has been tolerating the dressing changes without complication. Fortunately there does not appear to be any signs of active infection at this time. No fevers, chills, nausea, vomiting, or diarrhea. 8/23; the patient has 2 areas anteriorly and Shawn larger area medially on the right lower leg. He comes in today with his compression wrap slipping down. We do not have good edema control. We have been using silver alginate. I believe he completed Shawn course of antibioticso Levaquin given to him 2 weeks ago at his last visit. He states his girlfriend who is Shawn nurse changes his dressings. We have not seen this in 2 weeks. He just received Shawn compounded topical antibiotic based on I believe Shawn deep tissue culture that was done. I do not have Shawn result of the  deep tissue culture today and he forgot the compounded antibiotic. I will try to clarify this next time I tried to send him back to vascular at [facility] for review of his central venous obstruction although they would not even see him I think because of noncompliance, or at least perceived noncompliance with regards to the recommendations for weight loss, consideration of bariatric assessment for surgery etc. 8/30; although the patient's wounds anteriorly on the right lower leg extending medially were debrided today. Better looking wound surface he has better edema control still under 4-layer compression. He tells me he is using his compression pump twice Shawn day however they are greater than 69 years old and he might benefit from Shawn new set. He is using his Jodie Echevaria compounded antibiotic Since the last time he was here he saw Dr. Durwin Nora at vein [facility] vascular at our request. He was felt to have adequate arterial flow for healing although there was still the issue about whether he might  need an angiogram if things still. 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 He did not talk about the s previous central venous procedures that have been done at [facility]. That information is available in care everywhere however based on the duplex exam he was not felt to be Shawn candidate for any venous procedures on the right. He has no significant venous reflux on the right and has had previously had Shawn right greater saphenous vein ablated 9/6; the patient has Shawn cluster of small areas over the right anterior mid tibia as well as an area medially. That the air 1 medially is more confluent all of them look like they have Shawn better surface. We have been putting him in 4-layer compression and using his compression pumps twice Shawn day 9/13; both of the patient's wound areas appear somewhat better especially the large area medially. Objective Constitutional Vitals Time Taken: 3:30 PM, Height: 74 in, Weight: 425 lbs, BMI: 54.6, Respiratory Rate: 17 breaths/min. Integumentary (Hair, Skin) Wound #32 status is Open. Original cause of wound was Gradually Appeared. The date acquired was: 07/11/2020. The wound has been in treatment 9 weeks. The wound is located on the Right,Medial Lower Leg. The wound measures 4.5cm length x 8cm width x 0.3cm depth; 28.274cm^2 area and 8.482cm^3 volume. There is Fat Layer (Subcutaneous Tissue) exposed. There is no tunneling or undermining noted. There is Shawn large amount of serosanguineous drainage noted. The wound margin is distinct with the outline attached to the wound base. There is medium (34-66%) red granulation within the wound bed. There is Shawn medium (34- 66%) amount of necrotic tissue within the wound bed. Wound #34 status is Open. Original cause of wound was Gradually Appeared. The date acquired was: 11/14/2020. The wound has been in treatment 1 weeks. The wound is located on the Right,Anterior Lower Leg. The wound measures 6.5cm length x 6cm width x 0.2cm depth;  30.631cm^2 area and 6.126cm^3 volume. There is Fat Layer (Subcutaneous Tissue) exposed. There is no tunneling or undermining noted. There is Shawn large amount of serosanguineous drainage noted. The wound margin is distinct with the outline attached to the wound base. There is medium (34-66%) red, pink granulation within the wound bed. There is Shawn medium (34-66%) amount of necrotic tissue within the wound bed. Assessment Active Problems ICD-10 Chronic venous hypertension (idiopathic) with ulcer and inflammation of right lower extremity Lymphedema, not elsewhere classified Non-pressure chronic ulcer of other part of right lower leg with other specified  severity Procedures Wound #32 Pre-procedure diagnosis of Wound #32 is Shawn Lymphedema located on the Right,Medial Lower Leg . There was Shawn Excisional Skin/Subcutaneous Tissue Debridement with Shawn total area of 36 sq cm performed by Shawn Caul., [name]. With the following instrument(s): Curette to remove Viable and Non-Viable tissue/material. Material removed includes Subcutaneous Tissue, Skin: Dermis, and Skin: Epidermis after achieving pain control using Lidocaine. No specimens were taken. Shawn time out was conducted at 15:57, prior to the start of the procedure. Shawn Minimum amount of bleeding was controlled with Pressure. The procedure was tolerated well with Shawn pain level of 0 throughout and Shawn pain level of 0 following the procedure. Post Debridement Measurements: 4.5cm length x 8cm width x 0.3cm depth; 8.482cm^3 volume. Character of Wound/Ulcer Post Debridement is improved. Post procedure Diagnosis Wound #32: Same as Pre-Procedure Plan Follow-up Appointments: Return Appointment in 1 week. - [name] Bathing/ Shower/ Hygiene: May shower with protection but do not get wound dressing(s) wet. - May use Shawn cast wrap to put over wraps to shower; you can buy these at [facility] or [facility] Edema Control - Lymphedema / SCD / Other: Lymphedema Pumps. Use Lymphedema  pumps on leg(s) 2-3 times Shawn day for 45-60 minutes. If wearing any wraps or hose, do not remove them. Continue exercising as instructed. Elevate legs to the level of the heart or above for 30 minutes daily and/or when sitting, Shawn frequency of: - throughout the day Avoid standing for Julian periods of time. Patient to wear own compression stockings every day. - on Left Leg WOUND #32: - Lower Leg Wound Laterality: Right, Medial Cleanser: Soap and Water 2 x Per Week/15 Days Discharge Instructions: May shower and wash wound with dial antibacterial soap and water prior to dressing change. Cleanser: Wound Cleanser (Generic) 2 x Per Week/15 Days Discharge Instructions: Cleanse the wound with wound cleanser prior to applying Shawn clean dressing using gauze sponges, not tissue or cotton balls. Peri-Wound Care: Triamcinolone 15 (g) 2 x Per Week/15 Days Discharge Instructions: Use triamcinolone 15 (g) as directed Peri-Wound Care: Sween Lotion (Moisturizing lotion) 2 x Per Week/15 Days Discharge Instructions: Apply moisturizing lotion as directed Topical: Keystone antibiotic gel 2 x Per Week/15 Days Discharge Instructions: Apply directly to wound bed, under primary dressing Prim Dressing: Hydrofera Blue Classic Foam, 4x4 in (Generic) 2 x Per Week/15 Days ary Discharge Instructions: Moisten with saline prior to applying to wound bed Secondary Dressing: ABD Pad, 5x9 (Generic) 2 x Per Week/15 Days Discharge Instructions: Apply over primary dressing as directed. Secondary Dressing: Zetuvit Plus 4x8 in (Generic) 2 x Per Week/15 Days Discharge Instructions: Apply over primary dressing as directed. Secondary Dressing: CarboFLEX Odor Control Dressing, 4x4 in (Generic) 2 x Per Week/15 Days Discharge Instructions: Apply over primary dressing as directed. Com pression Wrap: CoFlex TLC XL 2-layer Compression System 4x7 (in/yd) 2 x Per Week/15 Days Discharge Instructions: Apply CoFlex 2-layer compression as directed. (alt  for 4 layer) WOUND #34: - Lower Leg Wound Laterality: Right, Anterior Cleanser: Soap and Water 2 x Per Week/15 Days Discharge Instructions: May shower and wash wound with dial antibacterial soap and water prior to dressing change. Cleanser: Wound Cleanser (Generic) 2 x Per Week/15 Days Discharge Instructions: Cleanse the wound with wound cleanser prior to applying Shawn clean dressing using gauze sponges, not tissue or cotton balls. Peri-Wound Care: Triamcinolone 15 (g) 2 x Per Week/15 Days Discharge Instructions: Use triamcinolone 15 (g) as directed Peri-Wound Care: Sween Lotion (Moisturizing lotion) 2 x Per Week/15 Days  Discharge Instructions: Apply moisturizing lotion as directed Topical: Keystone antibiotic gel 2 x Per Week/15 Days Discharge Instructions: Apply directly to wound bed, under primary dressing Prim Dressing: Hydrofera Blue Classic Foam, 4x4 in (Generic) 2 x Per Week/15 Days ary Discharge Instructions: Moisten with saline prior to applying to wound bed Secondary Dressing: ABD Pad, 5x9 (Generic) 2 x Per Week/15 Days Discharge Instructions: Apply over primary dressing as directed. Secondary Dressing: Zetuvit Plus 4x8 in (Generic) 2 x Per Week/15 Days Discharge Instructions: Apply over primary dressing as directed. Secondary Dressing: CarboFLEX Odor Control Dressing, 4x4 in (Generic) 2 x Per Week/15 Days Discharge Instructions: Apply over primary dressing as directed. Com pression Wrap: CoFlex TLC XL 2-layer Compression System 4x7 (in/yd) 2 x Per Week/15 Days Discharge Instructions: Apply CoFlex 2-layer compression as directed. (alt for 4 layer) 1. Continue with Hydrofera Blue 2. He did not bring his Keystone antibiotic into the clinic therefore we sent him home with Shawn 4-layer compression that his wife [[name]] should be able to apply he is asking to come every second week and I am okay with that if his wife is okay with doing 4-layer compression. He did not seem to understand the  complexity of this. Electronic Signature(s) Signed: 11/22/2020 3:40:47 PM By: [name] Entered By: [name] on 11/21/2020 16:28:09 -------------------------------------------------------------------------------- SuperBill Details Patient Name: Date of Service: [name], [name]. 11/21/2020 Medical Record Number: [id number] Patient Account Number: [id number] Date of Birth/Sex: Treating [name]: Aug 30, 1980 (40 y.o. [name], [name] Primary Care Provider: Marcy [name] Other Clinician: Referring Provider: Treating Provider/Extender: [name] in Treatment: 9 Diagnosis Coding ICD-10 Codes Code Description [id number] Chronic venous hypertension (idiopathic) with ulcer and inflammation of right lower extremity I89.0 Lymphedema, not elsewhere classified L97.818 Non-pressure chronic ulcer of other part of right lower leg with other specified severity Facility Procedures CPT4 Code: [id number] 11 I Description: 11042 - DEB SUBQ TISSUE 20 SQ CM/< ICD-10 Diagnosis Description L97.818 Non-pressure chronic ulcer of other part of right lower leg with other specified 045 - DEB SUBQ TISS EA ADDL 20CM CD-10 Diagnosis Description L97.818 Non-pressure  chronic ulcer of other part of right lower leg with other specified seve Modifier: severity 1 rity Quantity: 1 Physician Procedures : CPT4 Code Description Modifier [id number] 11042 - WC PHYS SUBQ TISS 20 SQ CM ICD-10 Diagnosis Description L97.818 Non-pressure chronic ulcer of other part of right lower leg with other specified severity Quantity: 1 : [id number] 11045 - WC PHYS SUBQ TISS EA ADDL 20 CM ICD-10 Diagnosis Description L97.818 Non-pressure chronic ulcer of other part of right lower leg with other specified severity Quantity: 1 Electronic Signature(s) Signed: 11/22/2020 3:40:47 PM By: [name] Entered By: [name] on 11/21/2020 01:75:10

## 2020-11-22 NOTE — Progress Notes (Signed)
[name], [name] ([id number]) . Visit Report for 11/21/2020 Arrival Information Details Patient Name: Date of Service: [name] 11/21/2020 3:15 PM Medical Record Number: [id number] Patient Account Number: [id number] Date of Birth/Sex: Treating RN: April 15, 1980 (40 y.o. [name], [name] Primary Care Nyellie Yetter: Marcy Siren Other Clinician: Referring Misk Galentine: Treating Madilyne Tadlock/Extender: [name] in Treatment: 9 Visit Information History Since Last Visit Added or deleted any medications: No Patient Arrived: Ambulatory Any new allergies or adverse reactions: No Arrival Time: 15:30 Had Shawn fall or experienced change in No Accompanied By: self activities of daily living that may affect Transfer Assistance: None risk of falls: Patient Identification Verified: Yes Signs or symptoms of abuse/neglect since last visito No Secondary Verification Process Completed: Yes Hospitalized since last visit: No Patient Requires Transmission-Based Precautions: No Implantable device outside of the clinic excluding No Patient Has Alerts: No cellular tissue based products placed in the center since last visit: Has Dressing in Place as Prescribed: Yes Has Compression in Place as Prescribed: Yes Pain Present Now: No Electronic Signature(s) Signed: 11/21/2020 5:15:03 PM By: Fonnie Mu RN Entered By: Fonnie Mu on 11/21/2020 15:30:32 -------------------------------------------------------------------------------- Encounter Discharge Information Details Patient Name: Date of Service: [name], Shawn Oppenheim [name]. 11/21/2020 3:15 PM Medical Record Number: [id number] Patient Account Number: [id number] Date of Birth/Sex: Treating RN: [birth year] (40 y.o. [name], [name] Primary Care Farris Blash: Marcy Siren Other Clinician: Referring Cornelious Bartolucci: Treating Shakisha Abend/Extender: [name] in Treatment: 9 Encounter Discharge  Information Items Post Procedure Vitals Discharge Condition: Stable Temperature (F): 97.4 Ambulatory Status: Ambulatory Pulse (bpm): 74 Discharge Destination: Home Respiratory Rate (breaths/min): 17 Transportation: Private Auto Blood Pressure (mmHg): 147/74 Accompanied By: self Schedule Follow-up Appointment: Yes Clinical Summary of Care: Patient Declined Electronic Signature(s) Signed: 11/21/2020 5:15:03 PM By: Fonnie Mu RN Entered By: Fonnie Mu on 11/21/2020 16:22:46 -------------------------------------------------------------------------------- Lower Extremity Assessment Details Patient Name: Date of Service: [name], Shawn Oppenheim [name]. 11/21/2020 3:15 PM Medical Record Number: [id number] Patient Account Number: [id number] Date of Birth/Sex: Treating RN: 14-May-1980 (40 y.o. [name], [name] Primary Care Madelene Kaatz: Marcy Siren Other Clinician: Referring Zaydn Gutridge: Treating Kashten Gowin/Extender: [name] in Treatment: 9 Edema Assessment Assessed: Kyra Searles: No] Franne Forts: Yes] Edema: [Left: Ye] [Right: s] Calf Left: Right: Point of Measurement: 36 cm From Medial Instep 59 cm Ankle Left: Right: Point of Measurement: 10 cm From Medial Instep 35 cm Vascular Assessment Pulses: Dorsalis Pedis Palpable: [Right:Yes] Posterior Tibial Palpable: [Right:Yes] Electronic Signature(s) Signed: 11/21/2020 5:15:03 PM By: Fonnie Mu RN Entered By: Fonnie Mu on 11/21/2020 15:33:27 -------------------------------------------------------------------------------- Multi Wound Chart Details Patient Name: Date of Service: [name], Shawn Oppenheim [name]. 11/21/2020 3:15 PM Medical Record Number: [id number] Patient Account Number: [id number] Date of Birth/Sex: Treating RN: [birth year] (40 y.o. [name] Primary Care Carrisa Keller: Marcy Siren Other Clinician: Referring Shihab States: Treating Shiori Adcox/Extender: [name] in Treatment: 9 Vital Signs Height(in): 74 Pulse(bpm): Weight(lbs): 425 Blood Pressure(mmHg): Body Mass Index(BMI): 55 Temperature(F): Respiratory Rate(breaths/min): 17 Photos: [32:Right, Medial Lower Leg] [34:Right, Anterior Lower Leg] [N/Shawn:N/Shawn N/Shawn] Wound Location: [32:Gradually Appeared] [34:Gradually Appeared] [N/Shawn:N/Shawn] Wounding Event: [32:Lymphedema] [34:Venous Leg Ulcer] [N/Shawn:N/Shawn] Primary Etiology: [32:Lymphedema, Deep Vein Thrombosis, Lymphedema, Deep Vein Thrombosis, N/Shawn] Comorbid History: [32:Hypertension, Peripheral Venous Disease, Type II Diabetes 07/11/2020] [34:Hypertension, Peripheral Venous Disease, Type II Diabetes 11/14/2020] [N/Shawn:N/Shawn] Date Acquired: [32:9] [34:1] [N/Shawn:N/Shawn] Weeks of Treatment: [32:Open] [34:Open] [N/Shawn:N/Shawn] Wound Status: [32:No] [34:Yes] [N/Shawn:N/Shawn] Clustered Wound: [32:N/Shawn] [34:3] [N/Shawn:N/Shawn] Clustered Quantity: [32:4.5x8x0.3] [34:6.5x6x0.2] [N/Shawn:N/Shawn] Measurements  L x W x D (cm) [32:28.274] [34:30.631] [N/Shawn:N/Shawn] Shawn (cm) : rea [32:8.482] [34:6.126] [N/Shawn:N/Shawn] Volume (cm) : [32:73.80%] [34:21.40%] [N/Shawn:N/Shawn] % Reduction in Shawn [32:rea: 80.40%] [34:47.60%] [N/Shawn:N/Shawn] % Reduction in Volume: [32:Full Thickness Without Exposed] [34:Full Thickness Without Exposed] [N/Shawn:N/Shawn] Classification: [32:Support Structures Large] [34:Support Structures Large] [N/Shawn:N/Shawn] Exudate Shawn mount: [32:Serosanguineous] [34:Serosanguineous] [N/Shawn:N/Shawn] Exudate Type: [32:red, brown] [34:red, brown] [N/Shawn:N/Shawn] Exudate Color: [32:Distinct, outline attached] [34:Distinct, outline attached] [N/Shawn:N/Shawn] Wound Margin: [32:Medium (34-66%)] [34:Medium (34-66%)] [N/Shawn:N/Shawn] Granulation Shawn mount: [32:Red] [34:Red, Pink] [N/Shawn:N/Shawn] Granulation Quality: [32:Medium (34-66%)] [34:Medium (34-66%)] [N/Shawn:N/Shawn] Necrotic Shawn mount: [32:Fat Layer (Subcutaneous Tissue): Yes Fat Layer (Subcutaneous Tissue): Yes N/Shawn] Exposed Structures: [32:Fascia: No Tendon: No Muscle: No Joint: No Bone: No Small (1-33%)]  [34:Fascia: No Tendon: No Muscle: No Joint: No Bone: No None] [N/Shawn:N/Shawn] Epithelialization: [32:Debridement - Excisional] [34:N/Shawn] [N/Shawn:N/Shawn] Debridement: Pre-procedure Verification/Time Out 15:57 [34:N/Shawn] [N/Shawn:N/Shawn] Taken: [32:Lidocaine] [34:N/Shawn] [N/Shawn:N/Shawn] Pain Control: [32:Subcutaneous] [34:N/Shawn] [N/Shawn:N/Shawn] Tissue Debrided: [32:Skin/Subcutaneous Tissue] [34:N/Shawn] [N/Shawn:N/Shawn] Level: [32:36] [34:N/Shawn] [N/Shawn:N/Shawn] Debridement Shawn (sq cm): [32:rea Curette] [34:N/Shawn] [N/Shawn:N/Shawn] Instrument: [32:Minimum] [34:N/Shawn] [N/Shawn:N/Shawn] Bleeding: [32:Pressure] [34:N/Shawn] [N/Shawn:N/Shawn] Hemostasis Shawn chieved: [32:0] [34:N/Shawn] [N/Shawn:N/Shawn] Procedural Pain: [32:0] [34:N/Shawn] [N/Shawn:N/Shawn] Post Procedural Pain: [32:Procedure was tolerated well] [34:N/Shawn] [N/Shawn:N/Shawn] Debridement Treatment Response: [32:4.5x8x0.3] [34:N/Shawn] [N/Shawn:N/Shawn] Post Debridement Measurements L x W x D (cm) [32:8.482] [34:N/Shawn] [N/Shawn:N/Shawn] Post Debridement Volume: (cm) [32:Debridement] [34:N/Shawn] [N/Shawn:N/Shawn] Treatment Notes Wound #32 (Lower Leg) Wound Laterality: Right, Medial Cleanser Soap and Water Discharge Instruction: May shower and wash wound with dial antibacterial soap and water prior to dressing change. Wound Cleanser Discharge Instruction: Cleanse the wound with wound cleanser prior to applying Shawn clean dressing using gauze sponges, not tissue or cotton balls. Peri-Wound Care Triamcinolone 15 (g) Discharge Instruction: Use triamcinolone 15 (g) as directed Sween Lotion (Moisturizing lotion) Discharge Instruction: Apply moisturizing lotion as directed Topical Keystone antibiotic gel Discharge Instruction: Apply directly to wound bed, under primary dressing Primary Dressing Hydrofera Blue Classic Foam, 4x4 in Discharge Instruction: Moisten with saline prior to applying to wound bed Secondary Dressing ABD Pad, 5x9 Discharge Instruction: Apply over primary dressing as directed. Zetuvit Plus 4x8 in Discharge Instruction: Apply over primary dressing as  directed. CarboFLEX Odor Control Dressing, 4x4 in Discharge Instruction: Apply over primary dressing as directed. Secured With Compression Wrap CoFlex TLC XL 2-layer Compression System 4x7 (in/yd) Discharge Instruction: Apply CoFlex 2-layer compression as directed. (alt for 4 layer) Compression Stockings Add-Ons Wound #34 (Lower Leg) Wound Laterality: Right, Anterior Cleanser Soap and Water Discharge Instruction: May shower and wash wound with dial antibacterial soap and water prior to dressing change. Wound Cleanser Discharge Instruction: Cleanse the wound with wound cleanser prior to applying Shawn clean dressing using gauze sponges, not tissue or cotton balls. Peri-Wound Care Triamcinolone 15 (g) Discharge Instruction: Use triamcinolone 15 (g) as directed Sween Lotion (Moisturizing lotion) Discharge Instruction: Apply moisturizing lotion as directed Topical Keystone antibiotic gel Discharge Instruction: Apply directly to wound bed, under primary dressing Primary Dressing Hydrofera Blue Classic Foam, 4x4 in Discharge Instruction: Moisten with saline prior to applying to wound bed Secondary Dressing ABD Pad, 5x9 Discharge Instruction: Apply over primary dressing as directed. Zetuvit Plus 4x8 in Discharge Instruction: Apply over primary dressing as directed. CarboFLEX Odor Control Dressing, 4x4 in Discharge Instruction: Apply over primary dressing as directed. Secured With Compression Wrap CoFlex TLC XL 2-layer Compression System 4x7 (in/yd) Discharge Instruction: Apply CoFlex 2-layer compression as directed. (alt for 4 layer) Compression Stockings Add-Ons Electronic Signature(s) Signed: 11/21/2020 5:15:03 PM By: Fonnie Mu RN Signed: 11/22/2020 3:40:47 PM By: Baltazar Najjar  MD Entered By: Baltazar Najjar on 11/21/2020 16:24:24 -------------------------------------------------------------------------------- Multi-Disciplinary Care Plan Details Patient Name: Date of  Service: [name], Shawn Oppenheim [name]. 11/21/2020 3:15 PM Medical Record Number: [id number] Patient Account Number: [id number] Date of Birth/Sex: Treating RN: Jun 07, 1980 (40 y.o. [name], [name] Primary Care Muriah Harsha: Marcy Siren Other Clinician: Referring Sulayman Manning: Treating Tifanie Gardiner/Extender: [name] in Treatment: 9 Active Inactive Wound/Skin Impairment Nursing Diagnoses: Impaired tissue integrity Knowledge deficit related to ulceration/compromised skin integrity Goals: Patient/caregiver will verbalize understanding of skin care regimen Date Initiated: 09/19/2020 Target Resolution Date: 01/06/2021 Goal Status: Active Interventions: Assess patient/caregiver ability to obtain necessary supplies Assess patient/caregiver ability to perform ulcer/skin care regimen upon admission and as needed Assess ulceration(s) every visit Notes: Electronic Signature(s) Signed: 11/21/2020 5:15:03 PM By: Fonnie Mu RN Entered By: Fonnie Mu on 11/21/2020 15:46:08 -------------------------------------------------------------------------------- Pain Assessment Details Patient Name: Date of Service: [name], Shawn Oppenheim [name]. 11/21/2020 3:15 PM Medical Record Number: [id number] Patient Account Number: [id number] Date of Birth/Sex: Treating RN: 15-Feb-1981 (40 y.o. [name], [name] Primary Care Carlyn Mullenbach: Marcy Siren Other Clinician: Referring Dhaval Woo: Treating Cyleigh Massaro/Extender: [name] in Treatment: 9 Active Problems Location of Pain Severity and Description of Pain Patient Has Paino No Site Locations Pain Management and Medication Current Pain Management: Electronic Signature(s) Signed: 11/21/2020 5:15:03 PM By: Fonnie Mu RN Signed: 11/21/2020 5:15:03 PM By: Fonnie Mu RN Entered By: Fonnie Mu on 11/21/2020  15:33:14 -------------------------------------------------------------------------------- Patient/Caregiver Education Details Patient Name: Date of Service: [name] 9/13/2022andnbsp3:15 PM Medical Record Number: [id number] Patient Account Number: [id number] Date of Birth/Gender: Treating RN: 01-09-1981 (40 y.o. [name] Primary Care Physician: Marcy Siren Other Clinician: Referring Physician: Treating Physician/Extender: [name] in Treatment: 9 Education Assessment Education Provided To: Patient Education Topics Provided Basic Hygiene: Methods: Explain/Verbal Responses: State content correctly Wound Debridement: Methods: Explain/Verbal Responses: Reinforcements needed Electronic Signature(s) Signed: 11/21/2020 5:15:03 PM By: Fonnie Mu RN Entered By: Fonnie Mu on 11/21/2020 15:46:46 -------------------------------------------------------------------------------- Wound Assessment Details Patient Name: Date of Service: [name], Shawn Oppenheim [name]. 11/21/2020 3:15 PM Medical Record Number: [id number] Patient Account Number: [id number] Date of Birth/Sex: Treating RN: 1980-06-24 (40 y.o. [name], [name] Primary Care Maeley Matton: Marcy Siren Other Clinician: Referring Leighanne Adolph: Treating Zhoe Catania/Extender: [name] in Treatment: 9 Wound Status Wound Number: 32 Primary Lymphedema Etiology: Wound Location: Right, Medial Lower Leg Wound Open Wounding Event: Gradually Appeared Status: Date Acquired: 07/11/2020 Comorbid Lymphedema, Deep Vein Thrombosis, Hypertension, Peripheral Weeks Of Treatment: 9 History: Venous Disease, Type II Diabetes Clustered Wound: No Photos Wound Measurements Length: (cm) 4.5 Width: (cm) 8 Depth: (cm) 0.3 Area: (cm) 28.274 Volume: (cm) 8.482 % Reduction in Area: 73.8% % Reduction in Volume: 80.4% Epithelialization: Small  (1-33%) Tunneling: No Undermining: No Wound Description Classification: Full Thickness Without Exposed Support Structures Wound Margin: Distinct, outline attached Exudate Amount: Large Exudate Type: Serosanguineous Exudate Color: red, brown Foul Odor After Cleansing: No Slough/Fibrino Yes Wound Bed Granulation Amount: Medium (34-66%) Exposed Structure Granulation Quality: Red Fascia Exposed: No Necrotic Amount: Medium (34-66%) Fat Layer (Subcutaneous Tissue) Exposed: Yes Tendon Exposed: No Muscle Exposed: No Joint Exposed: No Bone Exposed: No Treatment Notes Wound #32 (Lower Leg) Wound Laterality: Right, Medial Cleanser Soap and Water Discharge Instruction: May shower and wash wound with dial antibacterial soap and water prior to dressing change. Wound Cleanser Discharge Instruction: Cleanse the wound with wound cleanser prior to applying Shawn clean dressing using gauze sponges, not tissue  or cotton balls. Peri-Wound Care Triamcinolone 15 (g) Discharge Instruction: Use triamcinolone 15 (g) as directed Sween Lotion (Moisturizing lotion) Discharge Instruction: Apply moisturizing lotion as directed Topical Keystone antibiotic gel Discharge Instruction: Apply directly to wound bed, under primary dressing Primary Dressing Hydrofera Blue Classic Foam, 4x4 in Discharge Instruction: Moisten with saline prior to applying to wound bed Secondary Dressing ABD Pad, 5x9 Discharge Instruction: Apply over primary dressing as directed. Zetuvit Plus 4x8 in Discharge Instruction: Apply over primary dressing as directed. CarboFLEX Odor Control Dressing, 4x4 in Discharge Instruction: Apply over primary dressing as directed. Secured With Compression Wrap CoFlex TLC XL 2-layer Compression System 4x7 (in/yd) Discharge Instruction: Apply CoFlex 2-layer compression as directed. (alt for 4 layer) Compression Stockings Add-Ons Electronic Signature(s) Signed: 11/21/2020 5:15:03 PM By: Fonnie Mu RN Entered By: Fonnie Mu on 11/21/2020 15:38:35 -------------------------------------------------------------------------------- Wound Assessment Details Patient Name: Date of Service: [name], Shawn Oppenheim [name]. 11/21/2020 3:15 PM Medical Record Number: [id number] Patient Account Number: [id number] Date of Birth/Sex: Treating RN: 21-Sep-1980 (40 y.o. [name], [name] Primary Care Day Greb: Marcy Siren Other Clinician: Referring Kapono Luhn: Treating Kristyne Woodring/Extender: [name] in Treatment: 9 Wound Status Wound Number: 34 Primary Venous Leg Ulcer Etiology: Wound Location: Right, Anterior Lower Leg Wound Open Wounding Event: Gradually Appeared Status: Date Acquired: 11/14/2020 Comorbid Lymphedema, Deep Vein Thrombosis, Hypertension, Peripheral Weeks Of Treatment: 1 History: Venous Disease, Type II Diabetes Clustered Wound: Yes Photos Wound Measurements Length: (cm) 6.5 Width: (cm) 6 Depth: (cm) 0.2 Clustered Quantity: 3 Area: (cm) 30.631 Volume: (cm) 6.126 % Reduction in Area: 21.4% % Reduction in Volume: 47.6% Epithelialization: None Tunneling: No Undermining: No Wound Description Classification: Full Thickness Without Exposed Support Structures Wound Margin: Distinct, outline attached Exudate Amount: Large Exudate Type: Serosanguineous Exudate Color: red, brown Foul Odor After Cleansing: No Slough/Fibrino Yes Wound Bed Granulation Amount: Medium (34-66%) Exposed Structure Granulation Quality: Red, Pink Fascia Exposed: No Necrotic Amount: Medium (34-66%) Fat Layer (Subcutaneous Tissue) Exposed: Yes Tendon Exposed: No Muscle Exposed: No Joint Exposed: No Bone Exposed: No Treatment Notes Wound #34 (Lower Leg) Wound Laterality: Right, Anterior Cleanser Soap and Water Discharge Instruction: May shower and wash wound with dial antibacterial soap and water prior to dressing change. Wound Cleanser Discharge  Instruction: Cleanse the wound with wound cleanser prior to applying Shawn clean dressing using gauze sponges, not tissue or cotton balls. Peri-Wound Care Triamcinolone 15 (g) Discharge Instruction: Use triamcinolone 15 (g) as directed Sween Lotion (Moisturizing lotion) Discharge Instruction: Apply moisturizing lotion as directed Topical Keystone antibiotic gel Discharge Instruction: Apply directly to wound bed, under primary dressing Primary Dressing Hydrofera Blue Classic Foam, 4x4 in Discharge Instruction: Moisten with saline prior to applying to wound bed Secondary Dressing ABD Pad, 5x9 Discharge Instruction: Apply over primary dressing as directed. Zetuvit Plus 4x8 in Discharge Instruction: Apply over primary dressing as directed. CarboFLEX Odor Control Dressing, 4x4 in Discharge Instruction: Apply over primary dressing as directed. Secured With Compression Wrap CoFlex TLC XL 2-layer Compression System 4x7 (in/yd) Discharge Instruction: Apply CoFlex 2-layer compression as directed. (alt for 4 layer) Compression Stockings Add-Ons Electronic Signature(s) Signed: 11/21/2020 5:15:03 PM By: Fonnie Mu RN Entered By: Fonnie Mu on 11/21/2020 15:39:51 -------------------------------------------------------------------------------- Vitals Details Patient Name: Date of Service: [name], Shawn DRIA N [name]. 11/21/2020 3:15 PM Medical Record Number: [id number] Patient Account Number: [id number] Date of Birth/Sex: Treating RN: 09-Jul-1980 (40 y.o. [name], [name] Primary Care Brooke Payes: Marcy Siren Other Clinician: Referring Maci Eickholt: Treating Lacharles Altschuler/Extender: [name] in  Treatment: 9 Vital Signs Time Taken: 15:30 Respiratory Rate (breaths/min): 17 Height (in): 74 Reference Range: 80 - 120 mg / dl Weight (lbs): 409 Body Mass Index (BMI): 54.6 Electronic Signature(s) Signed: 11/21/2020 5:15:03 PM By: Fonnie Mu RN Entered By:  Fonnie Mu on 11/21/2020 15:30:47

## 2020-12-05 ENCOUNTER — Encounter (HOSPITAL_BASED_OUTPATIENT_CLINIC_OR_DEPARTMENT_OTHER): Payer: 59 | Admitting: Internal Medicine

## 2020-12-05 ENCOUNTER — Other Ambulatory Visit: Payer: Self-pay

## 2020-12-05 DIAGNOSIS — I87331 Chronic venous hypertension (idiopathic) with ulcer and inflammation of right lower extremity: Secondary | ICD-10-CM | POA: Diagnosis not present

## 2020-12-05 NOTE — Progress Notes (Signed)
Elgersma, Yacqub T ([id number]) . Visit Report for 12/05/2020 HPI Details Patient Name: Date of Service: [name] 12/05/2020 3:30 PM Medical Record Number: [id number] Patient Account Number: [id number] Date of Birth/Sex: Treating RN: 1980/05/09 (40 y.o. [name] Primary Care Provider: Marcy Siren Other Clinician: Referring Provider: Treating Provider/Extender: [name] in Treatment: 11 History of Present Illness HPI Description: The patient is a 40 yrs old bm here for evaluation of his right leg ulcer. He has an extensive history of lymphedema and ulcers. He is being treated at the [facility] by Dr. [name] with [name] boots and the [facility]. He has pumps at home but he is only using them once a day. 08/30/14 selective debridement done of surface eschar. The wound cleans up quite nicely in the bed of this looks healthy. He is using a wound VAC under an Unna wrap. 11/17/14; selective debridement done of surface eschar. Once again the wound beds at clean up quite nicely. He is no longer using a wound VAC. Recent dressing changes include Hydrofera Blue. Apparently he has done a wide variety of different topical dressings including advanced treatment options like Apligraf's without success. 03/21/15; surgical debridement done of surface eschar and nonviable subcutaneous tissue. The area on the medial aspect of the right leg has closed and the wound on the lateral right leg looks improved has been using Silver Collagen 04/11/15; I think attendance here is sporadic. The area on the right medial leg remains healed. He has a new tiny area on the back of the right leg. Again a surgical debridement of the surface eschar and nonviable subcutaneous tissue of the major wound on the right lateral leg. He has been using Silver Collagen and at home, he does his own Unna wraps at home edema control seems reasonable. 04/20/15; the area on the right medial leg has a very  superficial area which may not even be open nevertheless I felt needed to be dressed today [this is his original chronic wound] the new wound is on the right anterior lateral leg is underwent a surgical debridement. He has a small wound on the right posterior leg. He puts on his own Unna boots, according to our nurses he does this fairly well. We have been using Silver collagen. 05/02/2015 -- I understand in the past his venous studies have been done at [facility] and he was advised weight loss before they would attempt to tackle his iliac vein blockage. He has not gone back for review. 06/27/2015 -- we have applied for Apligraf and are awaiting his insurance clearance. 07/25/2015 -- he still has to use her back from his insurance agent regarding his copayment for his Apligraf. 07/31/2015 -- the patient got a reply from the insurance agent regarding the dollar payment for his application but is not sure whether it is for 5 or for one. 11/28/2015 -- has been hurting a little bit more and he has had change in color of his wound especially on the medial part. 12/05/2015 -- his culture was positive for Proteus mirabilis and K Oxytoca which are sensitive to ciprofloxacin which he is already on. 10/3/17still on ciprofloxacin. His mother reports of dressing had to be changed last Friday due to odor. He has not been systemically unwell 12/19/15; patient came in today complaining of increasing pain and tightness in his upper right thigh. He completed the ciprofloxacin 2 or 3 days ago. He is not running a fever today. 12/26/2015 -- last week  he had been seen by Dr. [name] who got a lower extremity venous duplex evaluation done which did not show DVT or SVT in the right lower extremity. he had also put him on Augmentin in addition to the previous ciprofloxacin and he had received for 2 weeks 01/02/2016 -- -- was admitted to the hospital on 12/26/2015 and discharged on 12/29/2015 and was treated for cellulitis.  He was also newly diagnosed with type 2 diabetes mellitus and outpatient monitoring and initiation of treatment was recommended. Patients hemoglobin A1c was 6.6 No osteomyelitis detected on x-rays and recent Doppler study was negative for DVT Oral doxycycline and Levaquin were recommended for the patient as an . outpatient for a 14 day treatment. IV Zosyn and vancomycin was given due to concerns of Pseudomonas infection while he was in hospital. 02/13/2016 -- he has not gone back to [facility] where he had his vascular opinion earlier and I have urged him to regroup with them to see if there is any surgical options available. 02/27/2016 -- has an appointment to see [facility] vascular surgeons on January 3 and may be seeing Dr. Verdie Drown 03/19/2016 -- I was not able to find any notes on Epic but the patient did go to the vascular clinic at [facility] and saw the PA to Dr. Verdie Drown. I understand she has recommended a CT scan and MRI to be done on February 1 and they would review this with him on the same day. 04/09/2016 -- was admitted to the hospital on 03/20/2016 acute respiratory failure with hypoxia, abdominal discomfort with erythema, hypertensive urgency and chronic wound of the right leg with morbid obesity. he was discharged home on 04/05/2016 and was to continue on IV antibiotics for 18 more days follow-up with the wound center and continue with his cardiologist. he has lost approximately 130 pounds and diuresed over 48 L. He was treated with IV Zosyn and ID recommended he continue this for 3 weeks more. The notes from [facility] wound clinic were noted where he was seen by the PA and she had taken cultures which grew MSSA and Pseudomonas. She had recommended edema control with a 4-layer compression and also referred him to the lymphedema clinic. A CT venogram was also planned for the future. 04/16/2016 -- at [facility] a CT pelvic venogram runoff was done on 04/11/2016 --  IMPRESSION:-Limited study secondary to poor opacification of venous structures. No definite evidence of acute deep vein thrombosis. 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 -Extensive right iliac chain and right inguinal adenopathy, favored to be reactive/secondary to congestion. The patient continues on his IV antibiotics through his PICC line and has his cardiology opinion and also a bariatric surgery opinion coming up. 04/30/2016 -- He has completed his IV antibiotics through his PICC line. 05/14/2016 --he was seen by the PA, Ms Sluss, recommended alternate day dressing with compression and use Hibiclens and Dakin's solution with acetic acid on his wounds. 07/30/2016 -- he has still not got his custom-made compression stockings and I have urged him to go and get him self measured for these. 08/06/2016 -- he has been measured for his custom stocking and will get in 2 weeks. He has lost 20 pounds since March 08/27/2016 -- he has not got his custom stockings yet and he tried another pair which has not fit well and he has had a lot of maceration increase the  size of his wound. 09/03/2016 -- the patient says that he has not been very compliant with his dressing changes and his elevation and exercise and this week he is notices wound get rather large with maceration. 09/18/16; according to our intake nurse the measurements on this patient's wound are slightly larger. I note previous compliance concerns. I note that his wounds measured larger last week which was noted by Dr. Meyer Russel. Culture done last week was negative. 10/01/2016 -- the patient has received some lymphedema pumps which are new and he says he is being compliant with this. He is going to be away for 2 weeks on vacation to [facility] 10/15/2016 -- he returns after 2 weeks and tells me he has been diligent with his dressing and has lost 25  pounds over the last 3 months. 10/22/2016 -- his last hemoglobin A1c was 6.2 and he is now diet controlled. 11/19/2016 -- he has been having problems with his compression stockings which are custom made at [facility]. He has not yet been able to get them. He had taken out his compression because he had gone there and had no dressing on when he came here today READMISSION 05/22/2018 Mr. Tornow is a now 40 year old man with a [place] history of wounds in his lower extremities secondary to chronic venous insufficiency with secondary lymphedema. He has had previous vein ligations on the right although I do not have this information in front of me. As I understand things he also has central venous obstruction with a vein bypass in 2005 I believe this was done at [facility]. He tells me over the last 2 months he has noted mid reopening in the right mid anterior lower extremity. This is a rectangular shaped wound which is kind of odd in terms of how that formed. He has been using silver alginate and Unna boots that we used on him when he was here in 2018. He buys his product supply online thinks this is cheaper than ordering through intermediary's Past medical history; CHF, morbid obesity, hypertension, hyperlipidemia, chronic venous insufficiency, vein bypass in 2005 previous vein ablations or ligations. He has a stent in the right lower extremity. ABI in this clinic was 1.04. He is using his compression pumps at home 3/27; 2-week follow-up. Right lower extremity wounds related to chronic venous insufficiency and lymphedema. He has been using silver alginate under an Radio broadcast assistant. He change the [employer] himself at home last week. He is making nice progress 4/10; 2-week follow-up. Right lower extremity wound is just about closed a small open area in the middle of the and large rectangular wound he had at the beginning. His edema control is excellent. He says he is using his compression pumps religiously 4/17;  2-week follow-up. Right lower extremity wound is totally closed. He had a large rectangular wound which is progressively closed. His edema control is very good. He is using his compression pumps once to twice a day READMISSION 09/19/2020 Mr. Rideaux is now a 40 year old man we have had for several stays in this clinic including 2017, 2018 and most recently in 2020 with a wound on his right lower leg. He has compression pumps which he claims to be using twice a day. He also has stockings however I am not sure how much he uses them. Things have broken down over the last month or 2 he has an extensive wound area on the right mid tibial area I think this is similar to what he has had in  the past. He has been using Xeroform and an Ace wrap that his girlfriend is applying. Past medical history; the patient has known chronic venous insufficiency with secondary lymphedema. He had an iliac vein blockage and he had a bypass at [facility] although the patient is not followed up with them. The surgeon may have been Dr. Jacolyn Reedy. As noted he has lymphedema pumps. Type 2 diabetes congestive heart failure obstructive sleep apnea. He has a history of a right leg DVT although this may have been the iliac vein he is not currently on anticoagulation ABI in our clinic is 1.37 on the right Imaging Results - in this encounter CT Pelvic Venogram Run Off Imaging Results - CT Pelvic Venogram Run Off Impressions Performed At -Limited study secondary to poor opacification of venous structures. Nodefinite evidence of acute deep vein thrombosis. 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 -Extensive right iliac chain and right inguinal adenopathy, favored to bereactive/secondary to congestion. [facility] Imaging Results - CT Pelvic Venogram Run Off Narrative Performed At EXAM: CT PELVIC VENOGRAM RUN OFF DATE: 04/11/2016 1:28 PM ACCESSION:  [id number] UN DICTATED: 04/11/2016 2:18 PM INTERPRETATION LOCATION: [facility] CLINICAL INDICATION: 40 years old Male with h/o RLE DVT and massive lymphedema of both LE's. Eval for venous outflow obstruction vs extrinsic mass- L97.203-Skin ulcer of calf with necrosis of muscle, unspecified laterality(RAF-HCC) COMPARISON: CT abdomen pelvis dated 02/11/2007 TECHNIQUE: A spiral CT scan was obtained approximately 3 minutes after administration of IV contrast from the kidneys to the knees.Images were reconstructed in the axial plane. Multiplanar reformatted and MIP images were provided for further evaluation of the vessels. For selected cases, 3D volumerendered images are also provided. VASCULAR FINDINGS: There is overall poor contrast opacification of the venous structures, limitingevaluation. IVC: No evidence of thrombus. Iliac veins: The right iliac veins are chronically occluded/diminutive in size. There are extensive venous collaterals noted throughout the right pelvis extending into the right lower extremity. The number and caliber of the venous collaterals have increased when compared to 02/11/2007. The left iliac veins appear patent. There are also small caliber venous collaterals within the left pelvis extending into the left lower extremity. Anterior abdominal wall venouscollaterals are also noted but fully imaged secondary to patient diameter. The bilateral femoral and popliteal veins appear patent. There is no evidence of acute thrombus. The arterial vasculature is unremarkable on this venous phase time study. NONVASCULAR FINDINGS: LOWER CHEST Unremarkable. : ABDOMEN: HEPATOBILIARY: Unremarkable liver. No biliary ductal dilatation. Gallbladder isremarkable. PANCREAS: Unremarkable. SPLEEN: Unremarkable. ADRENAL GLANDS: Unremarkable. KIDNEYS/URETERS: Unremarkable. BLADDER: Unremarkable. BOWEL/PERITONEUM/RETROPERITONEUM: No bowel obstruction. No acute inflammatoryprocess. No  ascites. LYMPH NODES: Extensive right iliac chain and right inguinal adenopathy. A nodal conglomerate in the right inguinal region measures 6.8 x 3.4 cm (2:124). This is only slightly larger when compared to 02/11/2007. Given interval stabilityover 10 years, these are favored to be reactive/secondary to congestion. REPRODUCTIVE: Unremarkable. BONES/SOFT TISSUES: No acute osseous abnormalities. Postsurgical changes in the right inguinal and left medial thigh region. Right greater than left softtissue edema throughout the lower extremities. [facility] Imaging Results - CT Pelvic Venogram Run Off Procedure Note Interface, Rad Results In - 04/11/2016 3:28 PM EST EXAM: CT PELVIC VENOGRAM RUN OFF DATE: 04/11/2016 1:28 PM ACCESSION: [id number] UN DICTATED: 04/11/2016 2:18 PM INTERPRETATION LOCATION: [facility] CLINICAL INDICATION: 40 years old Male with h/o RLE DVT and massive lymphedema of both LE's. Eval for venous outflow obstruction vs extrinsic mass- L97.203-Skin ulcer of  calf with necrosis of muscle, unspecified laterality (RAF-HCC) COMPARISON: CT abdomen pelvis dated 02/11/2007 TECHNIQUE: A spiral CT scan was obtained approximately 3 minutes after administration of IV contrast from the kidneys to the knees. Images were reconstructed in the axial plane. Multiplanar reformatted and MIP images were provided for further evaluation of the vessels. For selected cases, 3D volume rendered images are also provided. VASCULAR FINDINGS: There is overall poor contrast opacification of the venous structures, limiting evaluation. IVC: No evidence of thrombus. Iliac veins: The right iliac veins are chronically occluded/diminutive in size. There are extensive venous collaterals noted throughout the right pelvis extending into the right lower extremity. The number and caliber of the venous collaterals have increased when compared to 02/11/2007. The left iliac veins appear patent. There are also small caliber venous  collaterals within the left pelvis extending into the left lower extremity. Anterior abdominal wall venous collaterals are also noted but fully imaged secondary to patient diameter. The bilateral femoral and popliteal veins appear patent. There is no evidence of acute thrombus. The arterial vasculature is unremarkable on this venous phase time study. NONVASCULAR FINDINGS: LOWER CHEST Unremarkable. : ABDOMEN: HEPATOBILIARY: Unremarkable liver. No biliary ductal dilatation. Gallbladder is remarkable. PANCREAS: Unremarkable. SPLEEN: Unremarkable. ADRENAL GLANDS: Unremarkable. KIDNEYS/URETERS: Unremarkable. BLADDER: Unremarkable. BOWEL/PERITONEUM/RETROPERITONEUM: No bowel obstruction. No acute inflammatory process. No ascites. LYMPH NODES: Extensive right iliac chain and right inguinal adenopathy. A nodal conglomerate in the right inguinal region measures 6.8 x 3.4 cm (2:124). This is only slightly larger when compared to 02/11/2007. Given interval stability over 10 years, these are favored to be reactive/secondary to congestion. REPRODUCTIVE: Unremarkable. BONES/SOFT TISSUES: No acute osseous abnormalities. Postsurgical changes in the right inguinal and left medial thigh region. Right greater than left soft tissue edema throughout the lower extremities. IMPRESSION: -Limited study secondary to poor opacification of venous structures. No definite evidence of acute deep vein thrombosis. 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 -Extensive right iliac chain and right inguinal adenopathy, favored to be reactive/secondary to congestion. Imaging Results - CT Pelvic Venogram Run Off Performing Organization Address City/State/Zipcode Phone Number [facility] [address] [place], [place] [address] Surgical summary as listed below; Assessment: Djimon Lundstrom is a 40 y.o. male with [place] history of right lower  extremity chronic obstructive deep venous disease with recurrent ulceration, absence of right iliac vein, prior left to right femoral vein bypass. Patient continues with recurrent ulceration, his weight precludes him from further vascular studies at this time. If he is able to lose 50-100 pounds we would be able to do left lower extremity venogram possible intervention with 50% chance of re- opening occlusion per Dr. Jacolyn Reedy. For global health, weight loss, potential improvement in his chronic venous insufficiency would appreciateinput from bariatric surgery group for recommendations. 09/26/20; patient comes in today with not much improvement. He has also some odor. 3 open areas and this linear area in his mid calf require debridement. We have been using silver alginate under compression. I gave him Keflex last week for what I thought was cellulitis in the right medial thigh he is completing this and I think this is better. 7/26; patient comes in today with again necrotic debris over these wounds. According to our intake nurse extreme malodor and drainage. We have been using silver alginate under compression. He is complaining about pain in the wound area. 7/29; Patient presents for follow-up. He reports improvement to the wound. He has  finished taking Keflex. He has no issues or complaints today. 10/17/2020 upon evaluation today patient appears to be doing poorly in regard to his leg ulcer. He has been tolerating the dressing changes without complication. Fortunately there does not appear to be any signs of active infection at this time. No fevers, chills, nausea, vomiting, or diarrhea. 8/23; the patient has 2 areas anteriorly and a larger area medially on the right lower leg. He comes in today with his compression wrap slipping down. We do not have good edema control. We have been using silver alginate. I believe he completed a course of antibioticso Levaquin given to him 2 weeks ago at his last visit.  He states his girlfriend who is a nurse changes his dressings. We have not seen this in 2 weeks. He just received a compounded topical antibiotic based on I believe a deep tissue culture that was done. I do not have a result of the deep tissue culture today and he forgot the compounded antibiotic. I will try to clarify this next time I tried to send him back to vascular at [facility] for review of his central venous obstruction although they would not even see him I think because of noncompliance, or at least perceived noncompliance with regards to the recommendations for weight loss, consideration of bariatric assessment for surgery etc. 8/30; although the patient's wounds anteriorly on the right lower leg extending medially were debrided today. Better looking wound surface he has better edema control still under 4-layer compression. He tells me he is using his compression pump twice a day however they are greater than 40 years old and he might benefit from a new set. He is using his Jodie Echevaria compounded antibiotic Since the last time he was here he saw Dr. Durwin Nora at vein and vascular at our request. He was felt to have adequate arterial flow for healing although there was still the issue about whether he might need an angiogram if things still. 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 He did not talk about the s previous central venous procedures that have been done at [facility]. That information is available in care everywhere however based on the duplex exam he was not felt to be a candidate for any venous procedures on the right. He has no significant venous reflux on the right and has had previously had a right greater saphenous vein ablated 9/6; the patient has a cluster of small areas over the right anterior mid tibia as well as an area medially. That the air 1 medially is more confluent all of them look like they have a better surface. We have been putting him in 4-layer compression and  using his compression pumps twice a day 9/13; both of the patient's wound areas appear somewhat better especially the large area medially. 9/27; 2-week follow-up. His wife who is a Designer, jewellery has been changing his 4-layer compression with Hydrofera Blue and [place]stone I think the measurements of the 2 remaining areas 1 on the right anterior tibia and 1 on the right lateral are better. In another setting I might consider advanced treatment product here. He is using his compression pumps Electronic Signature(s) Signed: 12/05/2020 4:21:38 PM By: Baltazar Najjar MD Entered By: Baltazar Najjar on 12/05/2020 16:15:13 -------------------------------------------------------------------------------- Physical Exam Details Patient Name: Date of Service: [name], Shawn Oppenheim T. 12/05/2020 3:30 PM Medical Record Number: [id number] Patient Account Number: [id number] Date of Birth/Sex: Treating RN: 1981-01-31 (40 y.o. [name], [name]  Primary Care Provider: Marcy Siren Other Clinician: Referring Provider: Treating Provider/Extender: [name] in Treatment: 11 Constitutional Patient is hypertensive.. Pulse regular and within target range for patient.[name] Respirations regular, non-labored and within target range.. Temperature is normal and within the target range for the patient.[name] Appears in no distress. Notes Wound exam; he has 1 remaining area on the right anterior lower leg this is a lot better than last time. Even under illumination the wound looks good. Laterally he has is larger wound even at this looks fairly good. There is no evidence of surrounding infection Electronic Signature(s) Signed: 12/05/2020 4:21:38 PM By: Baltazar Najjar MD Entered By: Baltazar Najjar on 12/05/2020 16:16:12 -------------------------------------------------------------------------------- Physician Orders Details Patient Name: Date of Service: [name], Shawn Oppenheim T. 12/05/2020 3:30  PM Medical Record Number: [id number] Patient Account Number: [id number] Date of Birth/Sex: Treating RN: May 29, 1980 (40 y.o. Lytle Michaels Primary Care Provider: Marcy Siren Other Clinician: Referring Provider: Treating Provider/Extender: [name] in Treatment: 11 Verbal / Phone Orders: No Diagnosis Coding ICD-10 Coding Code Description [phone] Chronic venous hypertension (idiopathic) with ulcer and inflammation of right lower extremity I89.0 Lymphedema, not elsewhere classified L97.818 Non-pressure chronic ulcer of other part of right lower leg with other specified severity Follow-up Appointments ppointment in 1 week. - Dr. [name] Return A Bathing/ Shower/ Hygiene May shower with protection but do not get wound dressing(s) wet. - May use a cast wrap to put over wraps to shower; you can buy these at [facility] or [facility] Edema Control - Lymphedema / SCD / Other Lymphedema Pumps. Use Lymphedema pumps on leg(s) 2-3 times a day for 45-60 minutes. If wearing any wraps or hose, do not remove them. Continue exercising as instructed. Elevate legs to the level of the heart or above for 30 minutes daily and/or when sitting, a frequency of: - throughout the day Avoid standing for [place] periods of time. Patient to wear own compression stockings every day. - on Left Leg Wound Treatment Wound #32 - Lower Leg Wound Laterality: Right, Medial Cleanser: Soap and Water 2 x Per Week/15 Days Discharge Instructions: May shower and wash wound with dial antibacterial soap and water prior to dressing change. Cleanser: Wound Cleanser (Generic) 2 x Per Week/15 Days Discharge Instructions: Cleanse the wound with wound cleanser prior to applying a clean dressing using gauze sponges, not tissue or cotton balls. Peri-Wound Care: Triamcinolone 15 (g) 2 x Per Week/15 Days Discharge Instructions: Use triamcinolone 15 (g) as directed Peri-Wound Care: Sween Lotion (Moisturizing  lotion) 2 x Per Week/15 Days Discharge Instructions: Apply moisturizing lotion as directed Topical: Keystone antibiotic gel 2 x Per Week/15 Days Discharge Instructions: Apply directly to wound bed, under primary dressing Prim Dressing: Hydrofera Blue Classic Foam, 4x4 in (Generic) 2 x Per Week/15 Days ary Discharge Instructions: Moisten with saline prior to applying to wound bed Secondary Dressing: ABD Pad, 5x9 (Generic) 2 x Per Week/15 Days Discharge Instructions: Apply over primary dressing as directed. Secondary Dressing: Zetuvit Plus 4x8 in (Generic) 2 x Per Week/15 Days Discharge Instructions: Apply over primary dressing as directed. Secondary Dressing: CarboFLEX Odor Control Dressing, 4x4 in (Generic) 2 x Per Week/15 Days Discharge Instructions: Apply over primary dressing as directed. Compression Wrap: CoFlex TLC XL 2-layer Compression System 4x7 (in/yd) 2 x Per Week/15 Days Discharge Instructions: Apply CoFlex 2-layer compression as directed. (alt for 4 layer) Wound #34 - Lower Leg Wound Laterality: Right, Anterior Cleanser: Soap and Water 2 x Per Week/15 Days Discharge  Instructions: May shower and wash wound with dial antibacterial soap and water prior to dressing change. Cleanser: Wound Cleanser (Generic) 2 x Per Week/15 Days Discharge Instructions: Cleanse the wound with wound cleanser prior to applying a clean dressing using gauze sponges, not tissue or cotton balls. Peri-Wound Care: Triamcinolone 15 (g) 2 x Per Week/15 Days Discharge Instructions: Use triamcinolone 15 (g) as directed Peri-Wound Care: Sween Lotion (Moisturizing lotion) 2 x Per Week/15 Days Discharge Instructions: Apply moisturizing lotion as directed Topical: Keystone antibiotic gel 2 x Per Week/15 Days Discharge Instructions: Apply directly to wound bed, under primary dressing Prim Dressing: Hydrofera Blue Classic Foam, 4x4 in (Generic) 2 x Per Week/15 Days ary Discharge Instructions: Moisten with saline  prior to applying to wound bed Secondary Dressing: ABD Pad, 5x9 (Generic) 2 x Per Week/15 Days Discharge Instructions: Apply over primary dressing as directed. Secondary Dressing: Zetuvit Plus 4x8 in (Generic) 2 x Per Week/15 Days Discharge Instructions: Apply over primary dressing as directed. Secondary Dressing: CarboFLEX Odor Control Dressing, 4x4 in (Generic) 2 x Per Week/15 Days Discharge Instructions: Apply over primary dressing as directed. Compression Wrap: CoFlex TLC XL 2-layer Compression System 4x7 (in/yd) 2 x Per Week/15 Days Discharge Instructions: Apply CoFlex 2-layer compression as directed. (alt for 4 layer) Electronic Signature(s) Signed: 12/05/2020 4:21:38 PM By: Baltazar Najjar MD Signed: 12/05/2020 4:47:08 PM By: Antonieta Iba Entered By: Antonieta Iba on 12/05/2020 16:14:07 -------------------------------------------------------------------------------- Problem List Details Patient Name: Date of Service: [name], Shawn Oppenheim T. 12/05/2020 3:30 PM Medical Record Number: [id number] Patient Account Number: [id number] Date of Birth/Sex: Treating RN: 09/14/1980 (40 y.o. Lytle Michaels Primary Care Provider: Marcy Siren Other Clinician: Referring Provider: Treating Provider/Extender: [name] in Treatment: 11 Active Problems ICD-10 Encounter Code Description Active Date MDM Diagnosis I87.331 Chronic venous hypertension (idiopathic) with ulcer and inflammation of right 09/19/2020 No Yes lower extremity I89.0 Lymphedema, not elsewhere classified 09/19/2020 No Yes L97.818 Non-pressure chronic ulcer of other part of right lower leg with other specified 09/19/2020 No Yes severity Inactive Problems Resolved Problems Electronic Signature(s) Signed: 12/05/2020 4:21:38 PM By: Baltazar Najjar MD Entered By: Baltazar Najjar on 12/05/2020 16:13:14 -------------------------------------------------------------------------------- Progress Note  Details Patient Name: Date of Service: [name], Shawn Oppenheim T. 12/05/2020 3:30 PM Medical Record Number: [id number] Patient Account Number: [id number] Date of Birth/Sex: Treating RN: 04-17-1980 (40 y.o. [name] Primary Care Provider: Marcy Siren Other Clinician: Referring Provider: Treating Provider/Extender: [name] in Treatment: 11 Subjective History of Present Illness (HPI) The patient is a 40 yrs old bm here for evaluation of his right leg ulcer. He has an extensive history of lymphedema and ulcers. He is being treated at the [facility] by Dr. [name] with [name] boots and the [facility]. He has pumps at home but he is only using them once a day. 08/30/14 selective debridement done of surface eschar. The wound cleans up quite nicely in the bed of this looks healthy. He is using a wound VAC under an Unna wrap. 11/17/14; selective debridement done of surface eschar. Once again the wound beds at clean up quite nicely. He is no longer using a wound VAC. Recent dressing changes include Hydrofera Blue. Apparently he has done a wide variety of different topical dressings including advanced treatment options like Apligraf's without success. 03/21/15; surgical debridement done of surface eschar and nonviable subcutaneous tissue. The area on the medial aspect of the right leg has closed and the wound on the lateral right leg looks  improved has been using Silver Collagen 04/11/15; I think attendance here is sporadic. The area on the right medial leg remains healed. He has a new tiny area on the back of the right leg. Again a surgical debridement of the surface eschar and nonviable subcutaneous tissue of the major wound on the right lateral leg. He has been using Silver Collagen and at home, he does his own Unna wraps at home edema control seems reasonable. 04/20/15; the area on the right medial leg has a very superficial area which may not even be open nevertheless I felt needed  to be dressed today [this is his original chronic wound] the new wound is on the right anterior lateral leg is underwent a surgical debridement. He has a small wound on the right posterior leg. He puts on his own Unna boots, according to our nurses he does this fairly well. We have been using Silver collagen. 05/02/2015 -- I understand in the past his venous studies have been done at [facility] and he was advised weight loss before they would attempt to tackle his iliac vein blockage. He has not gone back for review. 06/27/2015 -- we have applied for Apligraf and are awaiting his insurance clearance. 07/25/2015 -- he still has to use her back from his insurance agent regarding his copayment for his Apligraf. 07/31/2015 -- the patient got a reply from the insurance agent regarding the dollar payment for his application but is not sure whether it is for 5 or for one. 11/28/2015 -- has been hurting a little bit more and he has had change in color of his wound especially on the medial part. 12/05/2015 -- his culture was positive for Proteus mirabilis and K Oxytoca which are sensitive to ciprofloxacin which he is already on. 12/12/15oostill on ciprofloxacin. His mother reports of dressing had to be changed last Friday due to odor. He has not been systemically unwell 12/19/15; patient came in today complaining of increasing pain and tightness in his upper right thigh. He completed the ciprofloxacin 2 or 3 days ago. He is not running a fever today. 12/26/2015 -- last week he had been seen by Dr. [name] who got a lower extremity venous duplex evaluation done which did not show DVT or SVT in the right lower extremity. he had also put him on Augmentin in addition to the previous ciprofloxacin and he had received for 2 weeks 01/02/2016 -- -- was admitted to the hospital on 12/26/2015 and discharged on 12/29/2015 and was treated for cellulitis. He was also newly diagnosed with type 2 diabetes mellitus and  outpatient monitoring and initiation of treatment was recommended. Patientoos hemoglobin A1c was 6.6 No osteomyelitis detected on x-rays and recent Doppler study was negative for DVT Oral doxycycline and Levaquin were recommended for the patient as an . outpatient for a 14 day treatment. IV Zosyn and vancomycin was given due to concerns of Pseudomonas infection while he was in hospital. 02/13/2016 -- he has not gone back to [facility] where he had his vascular opinion earlier and I have urged him to regroup with them to see if there is any surgical options available. 02/27/2016 -- has an appointment to see [facility] vascular surgeons on January 3 and may be seeing Dr. Verdie Drown 03/19/2016 -- I was not able to find any notes on Epic but the patient did go to the vascular clinic at [facility] and saw the PA to Dr. Verdie Drown. I understand she has recommended  a CT scan and MRI to be done on February 1 and they would review this with him on the same day. 04/09/2016 -- was admitted to the hospital on 03/20/2016 acute respiratory failure with hypoxia, abdominal discomfort with erythema, hypertensive urgency and chronic wound of the right leg with morbid obesity. he was discharged home on 04/05/2016 and was to continue on IV antibiotics for 18 more days follow-up with the wound center and continue with his cardiologist. he has lost approximately 130 pounds and diuresed over 48 L. He was treated with IV Zosyn and ID recommended he continue this for 3 weeks more. The notes from [facility] wound clinic were noted where he was seen by the PA and she had taken cultures which grew MSSA and Pseudomonas. She had recommended edema control with a 4-layer compression and also referred him to the lymphedema clinic. A CT venogram was also planned for the future. 04/16/2016 -- at [facility] a CT pelvic venogram runoff was done on 04/11/2016 -- IMPRESSION:-Limited study secondary to poor opacification of  venous structures. No definite evidence of acute deep vein thrombosis. 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 -Extensive right iliac chain and right inguinal adenopathy, favored to be reactive/secondary to congestion. The patient continues on his IV antibiotics through his PICC line and has his cardiology opinion and also a bariatric surgery opinion coming up. 04/30/2016 -- He has completed his IV antibiotics through his PICC line. 05/14/2016 --he was seen by the PA, Ms Sluss, recommended alternate day dressing with compression and use Hibiclens and Dakin's solution with acetic acid on his wounds. 07/30/2016 -- he has still not got his custom-made compression stockings and I have urged him to go and get him self measured for these. 08/06/2016 -- he has been measured for his custom stocking and will get in 2 weeks. He has lost 20 pounds since March 08/27/2016 -- he has not got his custom stockings yet and he tried another pair which has not fit well and he has had a lot of maceration increase the size of his wound. 09/03/2016 -- the patient says that he has not been very compliant with his dressing changes and his elevation and exercise and this week he is notices wound get rather large with maceration. 09/18/16; according to our intake nurse the measurements on this patient's wound are slightly larger. I note previous compliance concerns. I note that his wounds measured larger last week which was noted by Dr. Meyer Russel. Culture done last week was negative. 10/01/2016 -- the patient has received some lymphedema pumps which are new and he says he is being compliant with this. He is going to be away for 2 weeks on vacation to [facility] 10/15/2016 -- he returns after 2 weeks and tells me he has been diligent with his dressing and has lost 25 pounds over the last 3 months. 10/22/2016 -- his last hemoglobin  A1c was 6.2 and he is now diet controlled. 11/19/2016 -- he has been having problems with his compression stockings which are custom made at [facility]. He has not yet been able to get them. He had taken out his compression because he had gone there and had no dressing on when he came here today READMISSION 05/22/2018 [name] is a now 40 year old man with a [place] history of wounds in his lower extremities secondary to chronic venous insufficiency with secondary lymphedema. He has had previous vein ligations on  the right although I do not have this information in front of me. 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 He tells me over the last 2 months he has noted mid reopening in the right mid anterior lower extremity. This is a rectangular shaped wound which is kind of odd in terms of how that formed. He has been using silver alginate and Unna boots that we used on him when he was here in 2018. He buys his product supply online thinks this is cheaper than ordering through intermediary's Past medical history; CHF, morbid obesity, hypertension, hyperlipidemia, chronic venous insufficiency, vein bypass in 2005 previous vein ablations or ligations. He has a stent in the right lower extremity. ABI in this clinic was 1.04. He is using his compression pumps at home 3/27; 2-week follow-up. Right lower extremity wounds related to chronic venous insufficiency and lymphedema. He has been using silver alginate under an Radio broadcast assistant. He change the [employer] himself at home last week. He is making nice progress 4/10; 2-week follow-up. Right lower extremity wound is just about closed a small open area in the middle of the and large rectangular wound he had at the beginning. His edema control is excellent. He says he is using his compression pumps religiously 4/17; 2-week follow-up. Right lower extremity wound is totally closed. He  had a large rectangular wound which is progressively closed. His edema control is very good. He is using his compression pumps once to twice a day READMISSION 09/19/2020 [name] is now a 40 year old man we have had for several stays in this clinic including 2017, 2018 and most recently in 2020 with a wound on his right lower leg. He has compression pumps which he claims to be using twice a day. He also has stockings however I am not sure how much he uses them. Things have broken down over the last month or 2 he has an extensive wound area on the right mid tibial area I think this is similar to what he has had in the past. He has been using Xeroform and an Ace wrap that his girlfriend is applying. Past medical history; the patient has known chronic venous insufficiency with secondary lymphedema. He had an iliac vein blockage and he had a bypass at [facility] although the patient is not followed up with them. The surgeon may have been Dr. Jacolyn Reedy. As noted he has lymphedema pumps. Type 2 diabetes congestive heart failure obstructive sleep apnea. He has a history of a right leg DVT although this may have been the iliac vein he is not currently on anticoagulation ABI in our clinic is 1.37 on the right Imaging Results - in this encounter CT Pelvic Venogram Run Off Imaging Results - CT Pelvic Venogram Run Off Impressions Performed At -Limited study secondary to poor opacification of venous structures. Nodefinite evidence of acute deep vein thrombosis. 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 -Extensive right iliac chain and right inguinal adenopathy, favored to bereactive/secondary to congestion. [facility] Imaging Results - CT Pelvic Venogram Run Off Narrative Performed At EXAM: CT PELVIC VENOGRAM RUN OFF DATE: 04/11/2016 1:28 PM ACCESSION: [id number] UN DICTATED: 04/11/2016 2:18 PM INTERPRETATION LOCATION:  [facility] CLINICAL INDICATION: 40 years old Male with h/o RLE DVT and massive lymphedema of both LE's. Eval for venous outflow obstruction vs extrinsic mass- L97.203-Skin ulcer of  calf with necrosis of muscle, unspecified laterality(RAF-HCC) COMPARISON: CT abdomen pelvis dated 02/11/2007 TECHNIQUE: A spiral CT scan was obtained approximately 3 minutes after administration of IV contrast from the kidneys to the knees. Images were reconstructed in the axial plane. Multiplanar reformatted and MIP images were provided for further evaluation of the vessels. For selected cases, 3D volumerendered images are also provided. VASCULAR FINDINGS: There is overall poor contrast opacification of the venous structures, limitingevaluation. IVC: No evidence of thrombus. Iliac veins: The right iliac veins are chronically occluded/diminutive in size. There are extensive venous collaterals noted throughout the right pelvis extending into the right lower extremity. The number and caliber of the venous collaterals have increased when compared to 02/11/2007. The left iliac veins appear patent. There are also small caliber venous collaterals within the left pelvis extending into the left lower extremity. Anterior abdominal wall venouscollaterals are also noted but fully imaged secondary to patient diameter. The bilateral femoral and popliteal veins appear patent. There is no evidence of acute thrombus. The arterial vasculature is unremarkable on this venous phase time study. NONVASCULAR FINDINGS: LOWER CHEST Unremarkable. : ABDOMEN: HEPATOBILIARY: Unremarkable liver. No biliary ductal dilatation. Gallbladder isremarkable. PANCREAS: Unremarkable. SPLEEN: Unremarkable. ADRENAL GLANDS: Unremarkable. KIDNEYS/URETERS: Unremarkable. BLADDER: Unremarkable. BOWEL/PERITONEUM/RETROPERITONEUM: No bowel obstruction. No acute inflammatoryprocess. No ascites. LYMPH NODES: Extensive right iliac chain and right inguinal  adenopathy. A nodal conglomerate in the right inguinal region measures 6.8 x 3.4 cm (2:124). This is only slightly larger when compared to 02/11/2007. Given interval stabilityover 10 years, these are favored to be reactive/secondary to congestion. REPRODUCTIVE: Unremarkable. BONES/SOFT TISSUES: No acute osseous abnormalities. Postsurgical changes in the right inguinal and left medial thigh region. Right greater than left softtissue edema throughout the lower extremities. [facility] Imaging Results - CT Pelvic Venogram Run Off Procedure Note Interface, Rad Results In - 04/11/2016 3:28 PM EST EXAM: CT PELVIC VENOGRAM RUN OFF DATE: 04/11/2016 1:28 PM ACCESSION: [id number] UN DICTATED: 04/11/2016 2:18 PM INTERPRETATION LOCATION: [facility] CLINICAL INDICATION: 40 years old Male with h/o RLE DVT and massive lymphedema of both LE's. Eval for venous outflow obstruction vs extrinsic mass- L97.203-Skin ulcer of calf with necrosis of muscle, unspecified laterality (RAF-HCC) COMPARISON: CT abdomen pelvis dated 02/11/2007 TECHNIQUE: A spiral CT scan was obtained approximately 3 minutes after administration of IV contrast from the kidneys to the knees. Images were reconstructed in the axial plane. Multiplanar reformatted and MIP images were provided for further evaluation of the vessels. For selected cases, 3D volume rendered images are also provided. VASCULAR FINDINGS: There is overall poor contrast opacification of the venous structures, limiting evaluation. IVC: No evidence of thrombus. Iliac veins: The right iliac veins are chronically occluded/diminutive in size. There are extensive venous collaterals noted throughout the right pelvis extending into the right lower extremity. The number and caliber of the venous collaterals have increased when compared to 02/11/2007. The left iliac veins appear patent. There are also small caliber venous collaterals within the left pelvis extending into the left lower  extremity. Anterior abdominal wall venous collaterals are also noted but fully imaged secondary to patient diameter. The bilateral femoral and popliteal veins appear patent. There is no evidence of acute thrombus. The arterial vasculature is unremarkable on this venous phase time study. NONVASCULAR FINDINGS: LOWER CHEST Unremarkable. : ABDOMEN: HEPATOBILIARY: Unremarkable liver. No biliary ductal dilatation. Gallbladder is remarkable. PANCREAS: Unremarkable. SPLEEN: Unremarkable. ADRENAL GLANDS: Unremarkable. KIDNEYS/URETERS: Unremarkable. BLADDER: Unremarkable. BOWEL/PERITONEUM/RETROPERITONEUM: No bowel obstruction. No acute inflammatory process. No ascites. LYMPH NODES: Extensive right iliac chain and  right inguinal adenopathy. A nodal conglomerate in the right inguinal region measures 6.8 x 3.4 cm (2:124). This is only slightly larger when compared to 02/11/2007. Given interval stability over 10 years, these are favored to be reactive/secondary to congestion. REPRODUCTIVE: Unremarkable. BONES/SOFT TISSUES: No acute osseous abnormalities. Postsurgical changes in the right inguinal and left medial thigh region. Right greater than left soft tissue edema throughout the lower extremities. IMPRESSION: -Limited study secondary to poor opacification of venous structures. No definite evidence of acute deep vein thrombosis. 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 -Extensive right iliac chain and right inguinal adenopathy, favored to be reactive/secondary to congestion. Imaging Results - CT Pelvic Venogram Run Off Performing Organization Address City/State/Zipcode Phone Number [facility] [address] [place], [place] [address] Surgical summary as listed below; Assessment: [name] is a 40 y.o. male with [place] history of right lower extremity chronic obstructive deep venous disease with recurrent  ulceration, absence of right iliac vein, prior left to right femoral vein bypass. Patient continues with recurrent ulceration, his weight precludes him from further vascular studies at this time. If he is able to lose 50-100 pounds we would be able to do left lower extremity venogram possible intervention with 50% chance of re- opening occlusion per Dr. Jacolyn Reedy. For global health, weight loss, potential improvement in his chronic venous insufficiency would appreciateinput from bariatric surgery group for recommendations. 09/26/20; patient comes in today with not much improvement. He has also some odor. 3 open areas and this linear area in his mid calf require debridement. We have been using silver alginate under compression. I gave him Keflex last week for what I thought was cellulitis in the right medial thigh he is completing this and I think this is better. 7/26; patient comes in today with again necrotic debris over these wounds. According to our intake nurse extreme malodor and drainage. We have been using silver alginate under compression. He is complaining about pain in the wound area. 7/29; Patient presents for follow-up. He reports improvement to the wound. He has finished taking Keflex. He has no issues or complaints today. 10/17/2020 upon evaluation today patient appears to be doing poorly in regard to his leg ulcer. He has been tolerating the dressing changes without complication. Fortunately there does not appear to be any signs of active infection at this time. No fevers, chills, nausea, vomiting, or diarrhea. 8/23; the patient has 2 areas anteriorly and a larger area medially on the right lower leg. He comes in today with his compression wrap slipping down. We do not have good edema control. We have been using silver alginate. I believe he completed a course of antibioticso Levaquin given to him 2 weeks ago at his last visit. He states his girlfriend who is a nurse changes his dressings.  We have not seen this in 2 weeks. He just received a compounded topical antibiotic based on I believe a deep tissue culture that was done. I do not have a result of the deep tissue culture today and he forgot the compounded antibiotic. I will try to clarify this next time I tried to send him back to vascular at [facility] for review of his central venous obstruction although they would not even see him I think because of noncompliance, or at least perceived noncompliance with regards to the recommendations for weight loss, consideration of bariatric assessment for surgery etc. 8/30; although the patient's wounds anteriorly  on the right lower leg extending medially were debrided today. Better looking wound surface he has better edema control still under 4-layer compression. He tells me he is using his compression pump twice a day however they are greater than 56 years old and he might benefit from a new set. He is using his Jodie Echevaria compounded antibiotic Since the last time he was here he saw Dr. Durwin Nora at vein [facility] vascular at our request. He was felt to have adequate arterial flow for healing although there was still the issue about whether he might need an angiogram if things still. 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 He did not talk about the s previous central venous procedures that have been done at [facility]. That information is available in care everywhere however based on the duplex exam he was not felt to be a candidate for any venous procedures on the right. He has no significant venous reflux on the right and has had previously had a right greater saphenous vein ablated 9/6; the patient has a cluster of small areas over the right anterior mid tibia as well as an area medially. That the air 1 medially is more confluent all of them look like they have a better surface. We have been putting him in 4-layer compression and using his compression pumps twice a day 9/13; both of the  patient's wound areas appear somewhat better especially the large area medially. 9/27; 2-week follow-up. His wife who is a Designer, jewellery has been changing his 4-layer compression with Hydrofera Blue and Keystone I think the measurements of the 2 remaining areas 1 on the right anterior tibia and 1 on the right lateral are better. In another setting I might consider advanced treatment product here. He is using his compression pumps Objective Constitutional Patient is hypertensive.. Pulse regular and within target range for patient.[name] Respirations regular, non-labored and within target range.. Temperature is normal and within the target range for the patient.[name] Appears in no distress. Vitals Time Taken: 3:45 PM, Height: 74 in, Weight: 425 lbs, BMI: 54.6, Temperature: 98.3 F, Pulse: 77 bpm, Respiratory Rate: 20 breaths/min, Blood Pressure: 175/93 mmHg. General Notes: Wound exam; he has 1 remaining area on the right anterior lower leg this is a lot better than last time. Even under illumination the wound looks good. Laterally he has is larger wound even at this looks fairly good. There is no evidence of surrounding infection Integumentary (Hair, Skin) Wound #32 status is Open. Original cause of wound was Gradually Appeared. The date acquired was: 07/11/2020. The wound has been in treatment 11 weeks. The wound is located on the Right,Medial Lower Leg. The wound measures 4.5cm length x 4.5cm width x 0.3cm depth; 15.904cm^2 area and 4.771cm^3 volume. There is Fat Layer (Subcutaneous Tissue) exposed. There is no tunneling or undermining noted. There is a large amount of serosanguineous drainage noted. The wound margin is distinct with the outline attached to the wound base. There is large (67-100%) red granulation within the wound bed. There is a small (1-33%) amount of necrotic tissue within the wound bed including Adherent Slough. Wound #34 status is Open. Original cause of wound was Gradually Appeared.  The date acquired was: 11/14/2020. The wound has been in treatment 3 weeks. The wound is located on the Right,Anterior Lower Leg. The wound measures 6cm length x 4cm width x 0.2cm depth; 18.85cm^2 area and 3.77cm^3 volume. There is Fat Layer (Subcutaneous Tissue) exposed. There is no tunneling or  undermining noted. There is a medium amount of serosanguineous drainage noted. The wound margin is distinct with the outline attached to the wound base. There is large (67-100%) red, pink granulation within the wound bed. There is a small (1- 33%) amount of necrotic tissue within the wound bed including Adherent Slough. Assessment Active Problems ICD-10 Chronic venous hypertension (idiopathic) with ulcer and inflammation of right lower extremity Lymphedema, not elsewhere classified Non-pressure chronic ulcer of other part of right lower leg with other specified severity Plan Follow-up Appointments: Return Appointment in 1 week. - Dr. [name] Bathing/ Shower/ Hygiene: May shower with protection but do not get wound dressing(s) wet. - May use a cast wrap to put over wraps to shower; you can buy these at [facility] or [facility] Edema Control - Lymphedema / SCD / Other: Lymphedema Pumps. Use Lymphedema pumps on leg(s) 2-3 times a day for 45-60 minutes. If wearing any wraps or hose, do not remove them. Continue exercising as instructed. Elevate legs to the level of the heart or above for 30 minutes daily and/or when sitting, a frequency of: - throughout the day Avoid standing for Heinze periods of time. Patient to wear own compression stockings every day. - on Left Leg WOUND #32: - Lower Leg Wound Laterality: Right, Medial Cleanser: Soap and Water 2 x Per Week/15 Days Discharge Instructions: May shower and wash wound with dial antibacterial soap and water prior to dressing change. Cleanser: Wound Cleanser (Generic) 2 x Per Week/15 Days Discharge Instructions: Cleanse the wound with wound cleanser prior to applying  a clean dressing using gauze sponges, not tissue or cotton balls. Peri-Wound Care: Triamcinolone 15 (g) 2 x Per Week/15 Days Discharge Instructions: Use triamcinolone 15 (g) as directed Peri-Wound Care: Sween Lotion (Moisturizing lotion) 2 x Per Week/15 Days Discharge Instructions: Apply moisturizing lotion as directed Topical: Keystone antibiotic gel 2 x Per Week/15 Days Discharge Instructions: Apply directly to wound bed, under primary dressing Prim Dressing: Hydrofera Blue Classic Foam, 4x4 in (Generic) 2 x Per Week/15 Days ary Discharge Instructions: Moisten with saline prior to applying to wound bed Secondary Dressing: ABD Pad, 5x9 (Generic) 2 x Per Week/15 Days Discharge Instructions: Apply over primary dressing as directed. Secondary Dressing: Zetuvit Plus 4x8 in (Generic) 2 x Per Week/15 Days Discharge Instructions: Apply over primary dressing as directed. Secondary Dressing: CarboFLEX Odor Control Dressing, 4x4 in (Generic) 2 x Per Week/15 Days Discharge Instructions: Apply over primary dressing as directed. Com pression Wrap: CoFlex TLC XL 2-layer Compression System 4x7 (in/yd) 2 x Per Week/15 Days Discharge Instructions: Apply CoFlex 2-layer compression as directed. (alt for 4 layer) WOUND #34: - Lower Leg Wound Laterality: Right, Anterior Cleanser: Soap and Water 2 x Per Week/15 Days Discharge Instructions: May shower and wash wound with dial antibacterial soap and water prior to dressing change. Cleanser: Wound Cleanser (Generic) 2 x Per Week/15 Days Discharge Instructions: Cleanse the wound with wound cleanser prior to applying a clean dressing using gauze sponges, not tissue or cotton balls. Peri-Wound Care: Triamcinolone 15 (g) 2 x Per Week/15 Days Discharge Instructions: Use triamcinolone 15 (g) as directed Peri-Wound Care: Sween Lotion (Moisturizing lotion) 2 x Per Week/15 Days Discharge Instructions: Apply moisturizing lotion as directed Topical: Keystone antibiotic gel  2 x Per Week/15 Days Discharge Instructions: Apply directly to wound bed, under primary dressing Prim Dressing: Hydrofera Blue Classic Foam, 4x4 in (Generic) 2 x Per Week/15 Days ary Discharge Instructions: Moisten with saline prior to applying to wound bed Secondary Dressing: ABD Pad, 5x9 (  Generic) 2 x Per Week/15 Days Discharge Instructions: Apply over primary dressing as directed. Secondary Dressing: Zetuvit Plus 4x8 in (Generic) 2 x Per Week/15 Days Discharge Instructions: Apply over primary dressing as directed. Secondary Dressing: CarboFLEX Odor Control Dressing, 4x4 in (Generic) 2 x Per Week/15 Days Discharge Instructions: Apply over primary dressing as directed. Com pression Wrap: CoFlex TLC XL 2-layer Compression System 4x7 (in/yd) 2 x Per Week/15 Days Discharge Instructions: Apply CoFlex 2-layer compression as directed. (alt for 4 layer) 1. It is difficult to change the dressing from [facility] which seems to have done a good job cutting up the wound surfaces and some improvement in dimensions 2. I do not know that he is a good candidate for an advanced treatment option. Electronic Signature(s) Signed: 12/05/2020 4:21:38 PM By: Baltazar Najjar MD Entered By: Baltazar Najjar on 12/05/2020 16:17:53 -------------------------------------------------------------------------------- SuperBill Details Patient Name: Date of Service: [name], Theodosia Quay 12/05/2020 Medical Record Number: [id number] Patient Account Number: [id number] Date of Birth/Sex: Treating RN: 02/27/1981 (40 y.o. Lytle Michaels Primary Care Provider: Marcy Siren Other Clinician: Referring Provider: Treating Provider/Extender: [name] in Treatment: 11 Diagnosis Coding ICD-10 Codes Code Description [phone] Chronic venous hypertension (idiopathic) with ulcer and inflammation of right lower extremity I89.0 Lymphedema, not elsewhere classified L97.818 Non-pressure chronic  ulcer of other part of right lower leg with other specified severity Facility Procedures CPT4 Code: [id number] Description: 99213 - WOUND CARE VISIT-LEV 3 EST PT Modifier: Quantity: 1 Physician Procedures : CPT4 Code Description Modifier [id number] 99213 - WC PHYS LEVEL 3 - EST PT ICD-10 Diagnosis Description I87.331 Chronic venous hypertension (idiopathic) with ulcer and inflammation of right lower extremity I89.0 Lymphedema, not elsewhere classified  L97.818 Non-pressure chronic ulcer of other part of right lower leg with other specified severity Quantity: 1 Electronic Signature(s) Signed: 12/05/2020 4:21:38 PM By: Baltazar Najjar MD Entered By: Baltazar Najjar on 12/05/2020 16:18:11

## 2020-12-06 NOTE — Progress Notes (Signed)
Arney, Gehrig T ([id number]) . Visit Report for 12/05/2020 Arrival Information Details Patient Name: Date of Service: [name] 12/05/2020 3:30 PM Medical Record Number: [id number] Patient Account Number: [id number] Date of Birth/Sex: Treating RN: [birth year] (40 y.o. Lytle Michaels Primary Care [name]: Marcy Siren Other Clinician: Referring Keyshawna Prouse: Treating Emarie Paul/Extender: [name] in Treatment: 11 Visit Information History Since Last Visit Added or deleted any medications: No Patient Arrived: Ambulatory Any new allergies or adverse reactions: No Arrival Time: 15:43 Had Shawn fall or experienced change in No Transfer Assistance: None activities of daily living that may affect Patient Identification Verified: Yes risk of falls: Secondary Verification Process Completed: Yes Signs or symptoms of abuse/neglect since last visito No Patient Requires Transmission-Based Precautions: No Hospitalized since last visit: No Patient Has Alerts: No Implantable device outside of the clinic excluding No cellular tissue based products placed in the center since last visit: Has Dressing in Place as Prescribed: Yes Has Compression in Place as Prescribed: Yes Pain Present Now: No Electronic Signature(s) Signed: 12/05/2020 4:47:08 PM By: Antonieta Iba Entered By: Antonieta Iba on 12/05/2020 15:45:46 -------------------------------------------------------------------------------- Clinic Level of Care Assessment Details Patient Name: Date of Service: [name] 12/05/2020 3:30 PM Medical Record Number: [id number] Patient Account Number: [id number] Date of Birth/Sex: Treating RN: 10-Jun-1980 (40 y.o. Lytle Michaels Primary Care Dauna Ziska: Marcy Siren Other Clinician: Referring Rosaland Shiffman: Treating Abreanna Drawdy/Extender: [name] in Treatment: 11 Clinic Level of Care Assessment Items TOOL 4 Quantity  Score X- 1 0 Use when only an EandM is performed on FOLLOW-UP visit ASSESSMENTS - Nursing Assessment / Reassessment X- 1 10 Reassessment of Co-morbidities (includes updates in patient status) X- 1 5 Reassessment of Adherence to Treatment Plan ASSESSMENTS - Wound and Skin Shawn ssessment / Reassessment []  - 0 Simple Wound Assessment / Reassessment - one wound X- 2 5 Complex Wound Assessment / Reassessment - multiple wounds []  - 0 Dermatologic / Skin Assessment (not related to wound area) ASSESSMENTS - Focused Assessment []  - 0 Circumferential Edema Measurements - multi extremities []  - 0 Nutritional Assessment / Counseling / Intervention []  - 0 Lower Extremity Assessment (monofilament, tuning fork, pulses) []  - 0 Peripheral Arterial Disease Assessment (using hand held doppler) ASSESSMENTS - Ostomy and/or Continence Assessment and Care []  - 0 Incontinence Assessment and Management []  - 0 Ostomy Care Assessment and Management (repouching, etc.) PROCESS - Coordination of Care []  - 0 Simple Patient / Family Education for ongoing care X- 1 20 Complex (extensive) Patient / Family Education for ongoing care []  - 0 Staff obtains , Records, T Results / Process Orders est []  - 0 Staff telephones HHA, Nursing Homes / Clarify orders / etc []  - 0 Routine Transfer to another Facility (non-emergent condition) []  - 0 Routine Hospital Admission (non-emergent condition) []  - 0 New Admissions / / Ordering NPWT Apligraf, etc. , []  - 0 Emergency Hospital Admission (emergent condition) []  - 0 Simple Discharge Coordination []  - 0 Complex (extensive) Discharge Coordination PROCESS - Special Needs []  - 0 Pediatric / Minor Patient Management []  - 0 Isolation Patient Management []  - 0 Hearing / Language / Visual special needs []  - 0 Assessment of Community assistance (transportation, D/C planning, etc.) []  - 0 Additional assistance / Altered  mentation []  - 0 Support Surface(s) Assessment (bed, cushion, seat, etc.) INTERVENTIONS - Wound Cleansing / Measurement []  - 0 Simple Wound Cleansing - one wound X- 2 5 Complex Wound  Cleansing - multiple wounds X- 1 5 Wound Imaging (photographs - any number of wounds) []  - 0 Wound Tracing (instead of photographs) []  - 0 Simple Wound Measurement - one wound X- 2 5 Complex Wound Measurement - multiple wounds INTERVENTIONS - Wound Dressings []  - 0 Small Wound Dressing one or multiple wounds []  - 0 Medium Wound Dressing one or multiple wounds X- 1 20 Large Wound Dressing one or multiple wounds []  - 0 Application of Medications - topical []  - 0 Application of Medications - injection INTERVENTIONS - Miscellaneous []  - 0 External ear exam []  - 0 Specimen Collection (cultures, biopsies, blood, body fluids, etc.) []  - 0 Specimen(s) / Culture(s) sent or taken to Lab for analysis []  - 0 Patient Transfer (multiple staff / Nurse, adult / Similar devices) []  - 0 Simple Staple / Suture removal (25 or less) []  - 0 Complex Staple / Suture removal (26 or more) []  - 0 Hypo / Hyperglycemic Management (close monitor of Blood Glucose) []  - 0 Ankle / Brachial Index (ABI) - do not check if billed separately X- 1 5 Vital Signs Has the patient been seen at the hospital within the last three years: Yes Total Score: 95 Level Of Care: New/Established - Level 3 Electronic Signature(s) Signed: 12/05/2020 4:47:08 PM By: Antonieta Iba Entered By: Antonieta Iba on 12/05/2020 16:16:29 -------------------------------------------------------------------------------- Encounter Discharge Information Details Patient Name: Date of Service: [name], Shawn Oppenheim T. 12/05/2020 3:30 PM Medical Record Number: [id number] Patient Account Number: [id number] Date of Birth/Sex: Treating RN: 1980/11/17 (40 y.o. Lytle Michaels Primary Care Sinjin Amero: Marcy Siren Other Clinician: Referring Rosalita Carey: Treating  Gevin Perea/Extender: [name] in Treatment: 11 Encounter Discharge Information Items Discharge Condition: Stable Ambulatory Status: Ambulatory Discharge Destination: Home Transportation: Private Auto Schedule Follow-up Appointment: Yes Clinical Summary of Care: Provided on 12/05/2020 Form Type Recipient Paper Patient Patient Electronic Signature(s) Signed: 12/05/2020 4:47:08 PM By: Antonieta Iba Entered By: Antonieta Iba on 12/05/2020 16:18:25 -------------------------------------------------------------------------------- Lower Extremity Assessment Details Patient Name: Date of Service: [name], Shawn Oppenheim T. 12/05/2020 3:30 PM Medical Record Number: [id number] Patient Account Number: [id number] Date of Birth/Sex: Treating RN: 1980/08/31 (40 y.o. Lytle Michaels Primary Care Karlton Maya: Marcy Siren Other Clinician: Referring Amandy Chubbuck: Treating Leshea Jaggers/Extender: [name] in Treatment: 11 Edema Assessment Assessed: Kyra Searles: No] Franne Forts: Yes] Edema: [Left: Ye] [Right: s] Calf Left: Right: Point of Measurement: 36 cm From Medial Instep 45 cm Ankle Left: Right: Point of Measurement: 10 cm From Medial Instep 31 cm Vascular Assessment Pulses: Dorsalis Pedis Palpable: [Right:Yes] Electronic Signature(s) Signed: 12/05/2020 4:47:08 PM By: Antonieta Iba Entered By: Antonieta Iba on 12/05/2020 15:52:36 -------------------------------------------------------------------------------- Multi Wound Chart Details Patient Name: Date of Service: [name], Shawn Oppenheim T. 12/05/2020 3:30 PM Medical Record Number: [id number] Patient Account Number: [id number] Date of Birth/Sex: Treating RN: October 16, 1980 (40 y.o. [name], [name] Primary Care Silas Sedam: Marcy Siren Other Clinician: Referring Shelvy Heckert: Treating Sherrey North/Extender: [name] in Treatment: 11 Vital Signs Height(in):  74 Pulse(bpm): 77 Weight(lbs): 425 Blood Pressure(mmHg): 175/93 Body Mass Index(BMI): 55 Temperature(F): 98.3 Respiratory Rate(breaths/min): 20 Photos: [N/Shawn:N/Shawn] Right, Medial Lower Leg Right, Anterior Lower Leg N/Shawn Wound Location: Gradually Appeared Gradually Appeared N/Shawn Wounding Event: Lymphedema Venous Leg Ulcer N/Shawn Primary Etiology: Lymphedema, Deep Vein Thrombosis, Lymphedema, Deep Vein Thrombosis, N/Shawn Comorbid History: Hypertension, Peripheral Venous Hypertension, Peripheral Venous Disease, Type II Diabetes Disease, Type II Diabetes 07/11/2020 11/14/2020 N/Shawn Date Acquired: 11 3 N/Shawn Weeks of Treatment: Open Open N/Shawn  Wound Status: No Yes N/Shawn Clustered Wound: N/Shawn 3 N/Shawn Clustered Quantity: 4.5x4.5x0.3 6x4x0.2 N/Shawn Measurements L x W x D (cm) 15.904 18.85 N/Shawn Shawn (cm) : rea 4.771 3.77 N/Shawn Volume (cm) : 85.30% 51.60% N/Shawn % Reduction in Area: 89.00% 67.70% N/Shawn % Reduction in Volume: Full Thickness Without Exposed Full Thickness Without Exposed N/Shawn Classification: Support Structures Support Structures Large Medium N/Shawn Exudate Amount: Serosanguineous Serosanguineous N/Shawn Exudate Type: red, brown red, brown N/Shawn Exudate Color: Distinct, outline attached Distinct, outline attached N/Shawn Wound Margin: Large (67-100%) Large (67-100%) N/Shawn Granulation Amount: Red Red, Pink N/Shawn Granulation Quality: Small (1-33%) Small (1-33%) N/Shawn Necrotic Amount: Fat Layer (Subcutaneous Tissue): Yes Fat Layer (Subcutaneous Tissue): Yes N/Shawn Exposed Structures: Fascia: No Fascia: No Tendon: No Tendon: No Muscle: No Muscle: No Joint: No Joint: No Bone: No Bone: No Medium (34-66%) Small (1-33%) N/Shawn Epithelialization: Treatment Notes Electronic Signature(s) Signed: 12/05/2020 4:21:38 PM By: Baltazar Najjar MD Signed: 12/06/2020 5:21:18 PM By: Fonnie Mu RN Entered By: Baltazar Najjar on 12/05/2020  16:13:24 -------------------------------------------------------------------------------- Multi-Disciplinary Care Plan Details Patient Name: Date of Service: [name], Shawn Oppenheim T. 12/05/2020 3:30 PM Medical Record Number: [id number] Patient Account Number: [id number] Date of Birth/Sex: Treating RN: [birth year] (40 y.o. Lytle Michaels Primary Care Kelaiah Escalona: Marcy Siren Other Clinician: Referring Zakk Borgen: Treating Bridey Brookover/Extender: [name] in Treatment: 11 Active Inactive Wound/Skin Impairment Nursing Diagnoses: Impaired tissue integrity Knowledge deficit related to ulceration/compromised skin integrity Goals: Patient/caregiver will verbalize understanding of skin care regimen Date Initiated: 09/19/2020 Target Resolution Date: 01/06/2021 Goal Status: Active Interventions: Assess patient/caregiver ability to obtain necessary supplies Assess patient/caregiver ability to perform ulcer/skin care regimen upon admission and as needed Assess ulceration(s) every visit Notes: Electronic Signature(s) Signed: 12/05/2020 4:47:08 PM By: Antonieta Iba Entered By: Antonieta Iba on 12/05/2020 16:03:46 -------------------------------------------------------------------------------- Pain Assessment Details Patient Name: Date of Service: Shawn Bering T. 12/05/2020 3:30 PM Medical Record Number: [id number] Patient Account Number: [id number] Date of Birth/Sex: Treating RN: 06/28/1980 (40 y.o. Lytle Michaels Primary Care Ronel Rodeheaver: Marcy Siren Other Clinician: Referring Pavlos Yon: Treating Loida Calamia/Extender: [name] in Treatment: 11 Active Problems Location of Pain Severity and Description of Pain Patient Has Paino No Site Locations Pain Management and Medication Current Pain Management: Electronic Signature(s) Signed: 12/05/2020 4:47:08 PM By: Antonieta Iba Entered By: Antonieta Iba on 12/05/2020  15:52:20 -------------------------------------------------------------------------------- Patient/Caregiver Education Details Patient Name: Date of Service: [name] 9/27/2022andnbsp3:30 PM Medical Record Number: [id number] Patient Account Number: [id number] Date of Birth/Gender: Treating RN: 1980-11-09 (40 y.o. Lytle Michaels Primary Care Physician: Marcy Siren Other Clinician: Referring Physician: Treating Physician/Extender: [name] in Treatment: 11 Education Assessment Education Provided To: Patient Education Topics Provided Venous: Methods: Explain/Verbal, Printed Responses: State content correctly Wound/Skin Impairment: Methods: Explain/Verbal, Printed Responses: State content correctly Electronic Signature(s) Signed: 12/05/2020 4:47:08 PM By: Antonieta Iba Entered By: Antonieta Iba on 12/05/2020 16:04:13 -------------------------------------------------------------------------------- Wound Assessment Details Patient Name: Date of Service: Shawn Bering T. 12/05/2020 3:30 PM Medical Record Number: [id number] Patient Account Number: [id number] Date of Birth/Sex: Treating RN: 03/18/1980 (40 y.o. Lytle Michaels Primary Care Narvel Kozub: Marcy Siren Other Clinician: Referring Dae Antonucci: Treating Atoya Andrew/Extender: [name] in Treatment: 11 Wound Status Wound Number: [id number] Primary Lymphedema Etiology: Wound Location: Right, Medial Lower Leg Wound Open Wounding Event: Gradually Appeared Status: Date Acquired: 07/11/2020 Comorbid Lymphedema, Deep Vein Thrombosis, Hypertension, Peripheral Weeks Of Treatment: 11 History: Venous Disease, Type II Diabetes  Clustered Wound: No Photos Wound Measurements Length: (cm) 4.5 Width: (cm) 4.5 Depth: (cm) 0.3 Area: (cm) 15.904 Volume: (cm) 4.771 % Reduction in Area: 85.3% % Reduction in Volume: 89% Epithelialization: Medium  (34-66%) Tunneling: No Undermining: No Wound Description Classification: Full Thickness Without Exposed Support Structures Wound Margin: Distinct, outline attached Exudate Amount: Large Exudate Type: Serosanguineous Exudate Color: red, brown Foul Odor After Cleansing: No Slough/Fibrino Yes Wound Bed Granulation Amount: Large (67-100%) Exposed Structure Granulation Quality: Red Fascia Exposed: No Necrotic Amount: Small (1-33%) Fat Layer (Subcutaneous Tissue) Exposed: Yes Necrotic Quality: Adherent Slough Tendon Exposed: No Muscle Exposed: No Joint Exposed: No Bone Exposed: No Treatment Notes Wound #32 (Lower Leg) Wound Laterality: Right, Medial Cleanser Soap and Water Discharge Instruction: May shower and wash wound with dial antibacterial soap and water prior to dressing change. Wound Cleanser Discharge Instruction: Cleanse the wound with wound cleanser prior to applying Shawn clean dressing using gauze sponges, not tissue or cotton balls. Peri-Wound Care Triamcinolone 15 (g) Discharge Instruction: Use triamcinolone 15 (g) as directed Sween Lotion (Moisturizing lotion) Discharge Instruction: Apply moisturizing lotion as directed Topical Keystone antibiotic gel Discharge Instruction: Apply directly to wound bed, under primary dressing Primary Dressing Hydrofera Blue Classic Foam, 4x4 in Discharge Instruction: Moisten with saline prior to applying to wound bed Secondary Dressing ABD Pad, 5x9 Discharge Instruction: Apply over primary dressing as directed. Zetuvit Plus 4x8 in Discharge Instruction: Apply over primary dressing as directed. CarboFLEX Odor Control Dressing, 4x4 in Discharge Instruction: Apply over primary dressing as directed. Secured With Compression Wrap CoFlex TLC XL 2-layer Compression System 4x7 (in/yd) Discharge Instruction: Apply CoFlex 2-layer compression as directed. (alt for 4 layer) Compression Stockings Add-Ons Notes Patient forgot Keystone  antibiotic ointment, wife to apply with wrap when he gets home. Dry dressing applied. Electronic Signature(s) Signed: 12/05/2020 4:47:08 PM By: Antonieta Iba Entered By: Antonieta Iba on 12/05/2020 16:02:39 -------------------------------------------------------------------------------- Wound Assessment Details Patient Name: Date of Service: [name], Shawn Oppenheim T. 12/05/2020 3:30 PM Medical Record Number: [id number] Patient Account Number: [id number] Date of Birth/Sex: Treating RN: [birth year] (40 y.o. Lytle Michaels Primary Care Azarie Coriz: Marcy Siren Other Clinician: Referring Makell Drohan: Treating Emmaleigh Longo/Extender: [name] in Treatment: 11 Wound Status Wound Number: 34 Primary Venous Leg Ulcer Etiology: Wound Location: Right, Anterior Lower Leg Wound Open Wounding Event: Gradually Appeared Status: Date Acquired: 11/14/2020 Comorbid Lymphedema, Deep Vein Thrombosis, Hypertension, Peripheral Weeks Of Treatment: 3 History: Venous Disease, Type II Diabetes Clustered Wound: Yes Photos Wound Measurements Length: (cm) 6 Width: (cm) 4 Depth: (cm) 0.2 Clustered Quantity: 3 Area: (cm) 18. Volume: (cm) 3.7 Wound Description Classification: Full Thickness Without Exposed Support Struct Wound Margin: Distinct, outline attached Exudate Amount: Medium Exudate Type: Serosanguineous Exudate Color: red, brown Foul Odor After Cleansing: Slough/Fibrino % Reduction in Area: 51.6% % Reduction in Volume: 67.7% Epithelialization: Small (1-33%) Tunneling: No 85 Undermining: No 7 ures No Yes Wound Bed Granulation Amount: Large (67-100%) Exposed Structure Granulation Quality: Red, Pink Fascia Exposed: No Necrotic Amount: Small (1-33%) Fat Layer (Subcutaneous Tissue) Exposed: Yes Necrotic Quality: Adherent Slough Tendon Exposed: No Muscle Exposed: No Joint Exposed: No Bone Exposed: No Treatment Notes Wound #34 (Lower Leg) Wound Laterality:  Right, Anterior Cleanser Soap and Water Discharge Instruction: May shower and wash wound with dial antibacterial soap and water prior to dressing change. Wound Cleanser Discharge Instruction: Cleanse the wound with wound cleanser prior to applying Shawn clean dressing using gauze sponges, not tissue or cotton balls. Peri-Wound Care Triamcinolone 15 (g) Discharge  Instruction: Use triamcinolone 15 (g) as directed Sween Lotion (Moisturizing lotion) Discharge Instruction: Apply moisturizing lotion as directed Topical Keystone antibiotic gel Discharge Instruction: Apply directly to wound bed, under primary dressing Primary Dressing Hydrofera Blue Classic Foam, 4x4 in Discharge Instruction: Moisten with saline prior to applying to wound bed Secondary Dressing ABD Pad, 5x9 Discharge Instruction: Apply over primary dressing as directed. Zetuvit Plus 4x8 in Discharge Instruction: Apply over primary dressing as directed. CarboFLEX Odor Control Dressing, 4x4 in Discharge Instruction: Apply over primary dressing as directed. Secured With Compression Wrap CoFlex TLC XL 2-layer Compression System 4x7 (in/yd) Discharge Instruction: Apply CoFlex 2-layer compression as directed. (alt for 4 layer) Compression Stockings Add-Ons Notes Patient forgot Keystone antibiotic ointment, wife to apply with wrap when he gets home. Dry dressing applied. Electronic Signature(s) Signed: 12/05/2020 4:47:08 PM By: Antonieta Iba Entered By: Antonieta Iba on 12/05/2020 16:03:12 -------------------------------------------------------------------------------- Vitals Details Patient Name: Date of Service: [name], Shawn DRIA N T. 12/05/2020 3:30 PM Medical Record Number: [id number] Patient Account Number: [id number] Date of Birth/Sex: Treating RN: Nov 20, 1980 (40 y.o. Lytle Michaels Primary Care Jahson Emanuele: Marcy Siren Other Clinician: Referring Darren Nodal: Treating Yue Flanigan/Extender: [name] in Treatment: 11 Vital Signs Time Taken: 15:45 Temperature (F): 98.3 Height (in): 74 Pulse (bpm): 77 Weight (lbs): 425 Respiratory Rate (breaths/min): 20 Body Mass Index (BMI): 54.6 Blood Pressure (mmHg): 175/93 Reference Range: 80 - 120 mg / dl Electronic Signature(s) Signed: 12/05/2020 4:47:08 PM By: Antonieta Iba Entered By: Antonieta Iba on 12/05/2020 15:46:29

## 2020-12-13 ENCOUNTER — Encounter: Payer: Self-pay | Admitting: Family Medicine

## 2020-12-13 ENCOUNTER — Ambulatory Visit: Payer: Self-pay | Admitting: Family

## 2020-12-13 ENCOUNTER — Ambulatory Visit (INDEPENDENT_AMBULATORY_CARE_PROVIDER_SITE_OTHER): Payer: 59 | Admitting: Family Medicine

## 2020-12-13 VITALS — BP 146/92 | HR 80 | Resp 18

## 2020-12-13 DIAGNOSIS — M25561 Pain in right knee: Secondary | ICD-10-CM

## 2020-12-13 DIAGNOSIS — I10 Essential (primary) hypertension: Secondary | ICD-10-CM

## 2020-12-13 DIAGNOSIS — Z6841 Body Mass Index (BMI) 40.0 and over, adult: Secondary | ICD-10-CM

## 2020-12-13 DIAGNOSIS — M25562 Pain in left knee: Secondary | ICD-10-CM

## 2020-12-13 DIAGNOSIS — F419 Anxiety disorder, unspecified: Secondary | ICD-10-CM | POA: Diagnosis not present

## 2020-12-13 DIAGNOSIS — I4891 Unspecified atrial fibrillation: Secondary | ICD-10-CM

## 2020-12-13 MED ORDER — PAROXETINE HCL 20 MG PO TABS
20.0000 mg | ORAL_TABLET | Freq: Every day | ORAL | 0 refills | Status: DC
Start: 2020-12-13 — End: 2021-05-24

## 2020-12-13 MED ORDER — GABAPENTIN 300 MG PO CAPS: 300.0000 mg | ORAL_CAPSULE | Freq: Every day | ORAL | 0 refills | Status: DC

## 2020-12-13 NOTE — Progress Notes (Signed)
Use 2 xl cuff-Patient is still concerned about his constance knee pain 3/10   -Patient is concern about anxiety and panic attacks  - Patient concern about going into A-fib at night

## 2020-12-14 NOTE — Progress Notes (Signed)
Established Patient Office Visit  Subjective:  Patient ID: [name], male    DOB: Dec 02, 1980  Age: 40 y.o. MRN: [id number]  CC:  Chief Complaint  Patient presents with   Follow-up   Knee Pain   Insomnia   Diabetes    HPI [name] presents for follow-up of chronic medical issues with refill of medications.  Patient reports that the atrial fibrillation and hypertension are being managed by a cardiologist.  He does report some increased social stressors which have been causing him some anxiety lately.  Past Medical History:  Diagnosis Date   Chronic venous insufficiency    a. as a premature infant, required venous access for care with subsequent DVT and some sort of procedure -> eventually went on to have vein bypass around 2005.   Hypertension    Lymphedema    Morbid obesity (HCC)    OSA on CPAP      Social History   Socioeconomic History   Marital status: Single    Spouse name: Not on file   Number of children: Not on file   Years of education: Not on file   Highest education level: Not on file  Occupational History   Not on file  Tobacco Use   Smoking status: Never   Smokeless tobacco: Never  Substance and Sexual Activity   Alcohol use: Yes    Alcohol/week: 4.0 standard drinks    Types: 4 Standard drinks or equivalent per week    Comment: liquor - pt states once a week   Drug use: No   Sexual activity: Not on file  Other Topics Concern   Not on file  Social History Narrative   Not on file   Social Determinants of Health   Financial Resource Strain: Not on file  Food Insecurity: Not on file  Transportation Needs: Not on file  Physical Activity: Not on file  Stress: Not on file  Social Connections: Not on file  Intimate Partner Violence: Not on file    ROS Review of Systems  Cardiovascular:  Positive for palpitations. Negative for chest pain.  All other systems reviewed and are negative.  Objective:   Today's Vitals: BP (!) 146/92 (BP  Location: Right Arm, Patient Position: Sitting, Cuff Size: Large)   Pulse 80   Resp 18   SpO2 94%   Physical Exam Vitals and nursing note reviewed.  Constitutional:      General: He is not in acute distress.    Appearance: He is obese.  Cardiovascular:     Rate and Rhythm: Normal rate and regular rhythm.  Pulmonary:     Effort: Pulmonary effort is normal.     Breath sounds: Normal breath sounds.  Abdominal:     Palpations: Abdomen is soft.     Tenderness: There is no abdominal tenderness.  Musculoskeletal:     Right lower leg: Edema present.     Left lower leg: Edema present.  Neurological:     General: No focal deficit present.     Mental Status: He is alert and oriented to person, place, and time.    Assessment & Plan:   1. Atrial fibrillation, unspecified type [facility]) Management will be as per consultant.  2. Anxiety Paxil 20 mg p.o. daily was prescribed Will monitor.  3. Pain in both knees, unspecified chronicity Neurontin was refilled.  Also discussed how weight loss could also be helpful.  4. Uncontrolled hypertension Slightly elevated reading.  Discussed compliance.  Continue present  management.  .  5. Class 3 severe obesity due to excess calories with serious comorbidity and body mass index (BMI) of 60.0 to 69.9 in adult [facility]) Discussed dietary and activity options.  Goal for patient is 5 to 10 pounds of weight loss per month.    Outpatient Encounter Medications as of 12/13/2020  Medication Sig   Accu-Chek Softclix Lancets lancets Use as instructed   carvedilol (COREG) 12.5 MG tablet Take 1 tablet (12.5 mg total) by mouth 2 (two) times daily.   furosemide (LASIX) 80 MG tablet Take 1 tablet (80 mg total) by mouth 2 (two) times daily.   glucose blood (ACCU-CHEK AVIVA PLUS) test strip Use as instructed   levofloxacin (LEVAQUIN) 750 MG tablet Take 750 mg by mouth daily.   lisinopril (ZESTRIL) 40 MG tablet Take 1 tablet (40 mg total) by mouth daily.   meloxicam  (MOBIC) 15 MG tablet Take 1 tablet (15 mg total) by mouth daily.   metFORMIN (GLUCOPHAGE) 500 MG tablet Take 1 tablet (500 mg total) by mouth daily with breakfast.   metolazone (ZAROXOLYN) 2.5 MG tablet Take one tablet 30 minutes prior to your morning Furosemide (Lasix) on Saturdays and Tuesday or as directed by cardiology   Misc. Devices (SCD SOFT SLEEVES/KNEE LENGTH) MISC 2 each by Does not apply route as needed.   PARoxetine (PAXIL) 20 MG tablet Take 1 tablet (20 mg total) by mouth daily.   potassium chloride SA (KLOR-CON) 20 MEQ tablet Take 2 tablets (40 mEq total) by mouth 2 (two) times daily.   traMADol (ULTRAM) 50 MG tablet Take 50 mg by mouth every 6 (six) hours as needed for moderate pain.   gabapentin (NEURONTIN) 300 MG capsule Take 1 capsule (300 mg total) by mouth at bedtime.   spironolactone (ALDACTONE) 25 MG tablet Take 1 tablet (25 mg total) by mouth daily.   [DISCONTINUED] gabapentin (NEURONTIN) 300 MG capsule Take 1 capsule (300 mg total) by mouth at bedtime as needed.   No facility-administered encounter medications on file as of 12/13/2020.    Follow-up: Return in about 3 weeks (around 01/03/2021) for follow up.   Tommie Raymond, MD

## 2020-12-19 ENCOUNTER — Encounter (HOSPITAL_BASED_OUTPATIENT_CLINIC_OR_DEPARTMENT_OTHER): Payer: 59 | Attending: Internal Medicine | Admitting: Internal Medicine

## 2020-12-19 ENCOUNTER — Other Ambulatory Visit: Payer: Self-pay

## 2020-12-19 DIAGNOSIS — Z95820 Peripheral vascular angioplasty status with implants and grafts: Secondary | ICD-10-CM | POA: Insufficient documentation

## 2020-12-19 DIAGNOSIS — I890 Lymphedema, not elsewhere classified: Secondary | ICD-10-CM | POA: Diagnosis not present

## 2020-12-19 DIAGNOSIS — Z86718 Personal history of other venous thrombosis and embolism: Secondary | ICD-10-CM | POA: Diagnosis not present

## 2020-12-19 DIAGNOSIS — Z6841 Body Mass Index (BMI) 40.0 and over, adult: Secondary | ICD-10-CM | POA: Diagnosis not present

## 2020-12-19 DIAGNOSIS — I509 Heart failure, unspecified: Secondary | ICD-10-CM | POA: Insufficient documentation

## 2020-12-19 DIAGNOSIS — I110 Hypertensive heart disease with heart failure: Secondary | ICD-10-CM | POA: Insufficient documentation

## 2020-12-19 DIAGNOSIS — L97818 Non-pressure chronic ulcer of other part of right lower leg with other specified severity: Secondary | ICD-10-CM

## 2020-12-19 DIAGNOSIS — G4733 Obstructive sleep apnea (adult) (pediatric): Secondary | ICD-10-CM | POA: Diagnosis not present

## 2020-12-19 DIAGNOSIS — I87331 Chronic venous hypertension (idiopathic) with ulcer and inflammation of right lower extremity: Secondary | ICD-10-CM | POA: Diagnosis not present

## 2020-12-19 NOTE — Progress Notes (Signed)
[name] ([id number]) . Visit Report for 12/19/2020 Arrival Information Details Patient Name: Date of Service: [name] 12/19/2020 2:45 PM Medical Record Number: [id number] Patient Account Number: [id number] Date of Birth/Sex: Treating RN: Jan 15, 1981 (40 y.o. [name], [name] Konica Stankowski: Georganna Skeans Other Clinician: Referring Travers Goodley: Treating Laramie Meissner/Extender: Otilio Connors in Treatment: 13 Visit Information History Since Last Visit Added or deleted any medications: No Patient Arrived: Ambulatory Any new allergies or adverse reactions: No Arrival Time: 15:19 Had a fall or experienced change in No Accompanied By: self activities of daily living that may affect Transfer Assistance: Manual risk of falls: Patient Identification Verified: Yes Signs or symptoms of abuse/neglect since last visito No Secondary Verification Process Completed: Yes Hospitalized since last visit: No Patient Requires Transmission-Based Precautions: No Implantable device outside of the clinic excluding No Patient Has Alerts: No cellular tissue based products placed in the center since last visit: Has Dressing in Place as Prescribed: Yes Pain Present Now: Yes Electronic Signature(s) Signed: 12/19/2020 5:02:53 PM By: Fonnie Mu RN Entered By: Fonnie Mu on 12/19/2020 15:21:39 -------------------------------------------------------------------------------- Compression Therapy Details Patient Name: Date of Service: [name], Shawn Oppenheim [name]. 12/19/2020 2:45 PM Medical Record Number: [id number] Patient Account Number: [id number] Date of Birth/Sex: Treating RN: 1980-04-08 (40 y.o. [name], [name] Lien Lyman: Georganna Skeans Other Clinician: Referring Keelan Pomerleau: Treating Eissa Buchberger/Extender: Otilio Connors in Treatment: 13 Compression Therapy Performed for Wound Assessment: Wound #32 Right,Medial  Lower Leg Performed By: Clinician Fonnie Mu, RN Compression Type: Four Layer Post Procedure Diagnosis Same as Pre-procedure Electronic Signature(s) Signed: 12/19/2020 5:02:53 PM By: Fonnie Mu RN Entered By: Fonnie Mu on 12/19/2020 16:20:10 -------------------------------------------------------------------------------- Encounter Discharge Information Details Patient Name: Date of Service: [name], Shawn Oppenheim [name]. 12/19/2020 2:45 PM Medical Record Number: [id number] Patient Account Number: [id number] Date of Birth/Sex: Treating RN: Nov 29, 1980 (40 y.o. [name], [name] Dalary Hollar: Georganna Skeans Other Clinician: Referring Trevonne Nyland: Treating Rebekkah Powless/Extender: Otilio Connors in Treatment: 13 Encounter Discharge Information Items Discharge Condition: Stable Ambulatory Status: Ambulatory Discharge Destination: Home Transportation: Private Auto Accompanied By: self Schedule Follow-up Appointment: Yes Clinical Summary of [name]: Patient Declined Electronic Signature(s) Signed: 12/19/2020 5:02:53 PM By: Fonnie Mu RN Entered By: Fonnie Mu on 12/19/2020 16:21:16 -------------------------------------------------------------------------------- Lower Extremity Assessment Details Patient Name: Date of Service: [name], Shawn Oppenheim [name]. 12/19/2020 2:45 PM Medical Record Number: [id number] Patient Account Number: [id number] Date of Birth/Sex: Treating RN: 1981/03/08 (40 y.o. [name], [name] Zannie Locastro: Georganna Skeans Other Clinician: Referring Troyce Gieske: Treating Alessandra Sawdey/Extender: Otilio Connors in Treatment: 13 Edema Assessment Assessed: Kyra Searles: No] Franne Forts: Yes] Edema: [Left: Ye] [Right: s] Calf Left: Right: Point of Measurement: 36 cm From Medial Instep 45 cm Ankle Left: Right: Point of Measurement: 10 cm From Medial Instep 31 cm Vascular Assessment Pulses: Dorsalis  Pedis Palpable: [Right:Yes] Posterior Tibial Palpable: [Right:Yes] Electronic Signature(s) Signed: 12/19/2020 5:02:53 PM By: Fonnie Mu RN Entered By: Fonnie Mu on 12/19/2020 15:22:23 -------------------------------------------------------------------------------- Multi Wound Chart Details Patient Name: Date of Service: [name], Shawn Oppenheim [name]. 12/19/2020 2:45 PM Medical Record Number: [id number] Patient Account Number: [id number] Date of Birth/Sex: Treating RN: 12-Jan-1981 (40 y.o. [name] Primary [name] [name]: Georganna Skeans Other Clinician: Referring Mckenna Boruff: Treating Kim Lauver/Extender: Otilio Connors in Treatment: 13 Vital Signs Height(in): 74 Pulse(bpm): 74 Weight(lbs): 425 Blood Pressure(mmHg): 175/89 Body Mass Index(BMI): 55 Temperature(F): 99.2 Respiratory Rate(breaths/min): 17 Photos: [N/A:N/A] Right,  Medial Lower Leg Right, Anterior Lower Leg N/A Wound Location: Gradually Appeared Gradually Appeared N/A Wounding Event: Lymphedema Venous Leg Ulcer N/A Primary Etiology: Lymphedema, Deep Vein Thrombosis, Lymphedema, Deep Vein Thrombosis, N/A Comorbid History: Hypertension, Peripheral Venous Hypertension, Peripheral Venous Disease, Type II Diabetes Disease, Type II Diabetes 07/11/2020 11/14/2020 N/A Date Acquired: 13 5 N/A Weeks of Treatment: Open Healed - Epithelialized N/A Wound Status: No Yes N/A Clustered Wound: N/A 3 N/A Clustered Quantity: 2x4x0.2 0x0x0 N/A Measurements L x W x D (cm) 6.283 0 N/A A (cm) : rea 1.257 0 N/A Volume (cm) : 94.20% 100.00% N/A % Reduction in Area: 97.10% 100.00% N/A % Reduction in Volume: Full Thickness Without Exposed Full Thickness Without Exposed N/A Classification: Support Structures Support Structures Large Medium N/A Exudate Amount: Serosanguineous Serosanguineous N/A Exudate Type: red, brown red, brown N/A Exudate Color: Distinct, outline attached Distinct,  outline attached N/A Wound Margin: Large (67-100%) Large (67-100%) N/A Granulation Amount: Red Red, Pink N/A Granulation Quality: Small (1-33%) Small (1-33%) N/A Necrotic Amount: Fat Layer (Subcutaneous Tissue): Yes Fat Layer (Subcutaneous Tissue): Yes N/A Exposed Structures: Fascia: No Fascia: No Tendon: No Tendon: No Muscle: No Muscle: No Joint: No Joint: No Bone: No Bone: No Medium (34-66%) Small (1-33%) N/A Epithelialization: Compression Therapy N/A N/A Procedures Performed: Treatment Notes Wound #32 (Lower Leg) Wound Laterality: Right, Medial Cleanser Soap and Water Discharge Instruction: May shower and wash wound with dial antibacterial soap and water prior to dressing change. Wound Cleanser Discharge Instruction: Cleanse the wound with wound cleanser prior to applying a clean dressing using gauze sponges, not tissue or cotton balls. Peri-Wound [name] Triamcinolone 15 (g) Discharge Instruction: Use triamcinolone 15 (g) as directed Sween Lotion (Moisturizing lotion) Discharge Instruction: Apply moisturizing lotion as directed Topical Keystone antibiotic gel Discharge Instruction: Apply directly to wound bed, under primary dressing Primary Dressing Hydrofera Blue Classic Foam, 4x4 in Discharge Instruction: Moisten with saline prior to applying to wound bed Secondary Dressing ABD Pad, 5x9 Discharge Instruction: Apply over primary dressing as directed. Zetuvit Plus 4x8 in Discharge Instruction: Apply over primary dressing as directed. CarboFLEX Odor Control Dressing, 4x4 in Discharge Instruction: Apply over primary dressing as directed. Secured With Compression Wrap CoFlex TLC XL 2-layer Compression System 4x7 (in/yd) Discharge Instruction: Apply CoFlex 2-layer compression as directed. (alt for 4 layer) Compression Stockings Add-Ons Wound #34 (Lower Leg) Wound Laterality: Right, Anterior Cleanser Peri-Wound [name] Topical Primary Dressing Secondary  Dressing Secured With Compression Wrap Compression Stockings Add-Ons Electronic Signature(s) Signed: 12/19/2020 4:28:54 PM By: Geralyn Corwin DO Signed: 12/19/2020 5:07:29 PM By: Shawn Stall RN, BSN Entered By: Geralyn Corwin on 12/19/2020 16:21:57 -------------------------------------------------------------------------------- Multi-Disciplinary [name] Plan Details Patient Name: Date of Service: [name], Shawn Oppenheim [name]. 12/19/2020 2:45 PM Medical Record Number: [id number] Patient Account Number: [id number] Date of Birth/Sex: Treating RN: [birth year] (40 y.o. [name], [name] Josuha Fontanez: Georganna Skeans Other Clinician: Referring Alazae Crymes: Treating Jeromy Borcherding/Extender: Otilio Connors in Treatment: 13 Active Inactive Wound/Skin Impairment Nursing Diagnoses: Impaired tissue integrity Knowledge deficit related to ulceration/compromised skin integrity Goals: Patient/caregiver will verbalize understanding of skin [name] regimen Date Initiated: 09/19/2020 Target Resolution Date: 01/06/2021 Goal Status: Active Interventions: Assess patient/caregiver ability to obtain necessary supplies Assess patient/caregiver ability to perform ulcer/skin [name] regimen upon admission and as needed Assess ulceration(s) every visit Notes: Electronic Signature(s) Signed: 12/19/2020 5:02:53 PM By: Fonnie Mu RN Entered By: Fonnie Mu on 12/19/2020 16:03:50 -------------------------------------------------------------------------------- Pain Assessment Details Patient Name: Date of Service: [name], A DRIA N [name]. 12/19/2020 2:45  PM Medical Record Number: [id number] Patient Account Number: [id number] Date of Birth/Sex: Treating RN: 02/01/1981 (40 y.o. [name] Primary [name] Maitland Lesiak: Georganna Skeans Other Clinician: Referring Osmara Drummonds: Treating Akshara Blumenthal/Extender: Otilio Connors in Treatment: 13 Active  Problems Location of Pain Severity and Description of Pain Patient Has Paino Yes Site Locations Pain Location: Pain in Ulcers With Dressing Change: Yes Duration of the Pain. Constant / Intermittento Intermittent Rate the pain. Current Pain Level: 3 Worst Pain Level: 10 Least Pain Level: 0 Tolerable Pain Level: 3 Character of Pain Describe the Pain: Aching Pain Management and Medication Current Pain Management: Medication: No Cold Application: No Rest: No Massage: No Activity: No SerranoE.N.S.: No Heat Application: No Leg drop or elevation: No Is the Current Pain Management Adequate: Adequate How does your wound impact your activities of daily livingo Sleep: No Bathing: No Appetite: No Relationship With Others: No Bladder Continence: No Emotions: No Bowel Continence: No Work: No Toileting: No Drive: No Dressing: No Hobbies: No Electronic Signature(s) Signed: 12/19/2020 5:02:53 PM By: Fonnie Mu RN Entered By: Fonnie Mu on 12/19/2020 15:22:14 -------------------------------------------------------------------------------- Patient/Caregiver Education Details Patient Name: Date of Service: [name] 10/11/2022andnbsp2:45 PM Medical Record Number: [id number] Patient Account Number: [id number] Date of Birth/Gender: Treating RN: 05-16-1980 (40 y.o. [name] Primary [name] Physician: Georganna Skeans Other Clinician: Referring Physician: Treating Physician/Extender: Otilio Connors in Treatment: 13 Education Assessment Education Provided To: Patient Education Topics Provided Basic Hygiene: Methods: Explain/Verbal Responses: State content correctly Electronic Signature(s) Signed: 12/19/2020 5:02:53 PM By: Fonnie Mu RN Entered By: Fonnie Mu on 12/19/2020 16:04:22 -------------------------------------------------------------------------------- Wound Assessment Details Patient Name: Date of  Service: [name], Shawn Oppenheim [name]. 12/19/2020 2:45 PM Medical Record Number: [id number] Patient Account Number: [id number] Date of Birth/Sex: Treating RN: [birth year] (40 y.o. [name], [name] Dajanay Northrup: Georganna Skeans Other Clinician: Referring Trell Secrist: Treating Charletta Voight/Extender: Otilio Connors in Treatment: 13 Wound Status Wound Number: 32 Primary Lymphedema Etiology: Wound Location: Right, Medial Lower Leg Wound Open Wounding Event: Gradually Appeared Status: Date Acquired: 07/11/2020 Comorbid Lymphedema, Deep Vein Thrombosis, Hypertension, Peripheral Weeks Of Treatment: 13 History: Venous Disease, Type II Diabetes Clustered Wound: No Photos Wound Measurements Length: (cm) 2 Width: (cm) 4 Depth: (cm) 0.2 Area: (cm) 6.283 Volume: (cm) 1.257 % Reduction in Area: 94.2% % Reduction in Volume: 97.1% Epithelialization: Medium (34-66%) Tunneling: No Undermining: No Wound Description Classification: Full Thickness Without Exposed Support Structures Wound Margin: Distinct, outline attached Exudate Amount: Large Exudate Type: Serosanguineous Exudate Color: red, brown Foul Odor After Cleansing: No Slough/Fibrino Yes Wound Bed Granulation Amount: Large (67-100%) Exposed Structure Granulation Quality: Red Fascia Exposed: No Necrotic Amount: Small (1-33%) Fat Layer (Subcutaneous Tissue) Exposed: Yes Necrotic Quality: Adherent Slough Tendon Exposed: No Muscle Exposed: No Joint Exposed: No Bone Exposed: No Treatment Notes Wound #32 (Lower Leg) Wound Laterality: Right, Medial Cleanser Soap and Water Discharge Instruction: May shower and wash wound with dial antibacterial soap and water prior to dressing change. Wound Cleanser Discharge Instruction: Cleanse the wound with wound cleanser prior to applying a clean dressing using gauze sponges, not tissue or cotton balls. Peri-Wound [name] Triamcinolone 15 (g) Discharge Instruction: Use  triamcinolone 15 (g) as directed Sween Lotion (Moisturizing lotion) Discharge Instruction: Apply moisturizing lotion as directed Topical Keystone antibiotic gel Discharge Instruction: Apply directly to wound bed, under primary dressing Primary Dressing Hydrofera Blue Classic Foam, 4x4 in Discharge Instruction: Moisten with saline prior to applying to wound bed Secondary Dressing  ABD Pad, 5x9 Discharge Instruction: Apply over primary dressing as directed. Zetuvit Plus 4x8 in Discharge Instruction: Apply over primary dressing as directed. CarboFLEX Odor Control Dressing, 4x4 in Discharge Instruction: Apply over primary dressing as directed. Secured With Compression Wrap CoFlex TLC XL 2-layer Compression System 4x7 (in/yd) Discharge Instruction: Apply CoFlex 2-layer compression as directed. (alt for 4 layer) Compression Stockings Add-Ons Electronic Signature(s) Signed: 12/19/2020 5:02:53 PM By: Fonnie Mu RN Entered By: Fonnie Mu on 12/19/2020 15:30:14 -------------------------------------------------------------------------------- Wound Assessment Details Patient Name: Date of Service: [name], Shawn Oppenheim [name]. 12/19/2020 2:45 PM Medical Record Number: [id number] Patient Account Number: [id number] Date of Birth/Sex: Treating RN: 1980/07/04 (40 y.o. [name], [name] Jameel Quant: Georganna Skeans Other Clinician: Referring Cire Clute: Treating Kareen Jefferys/Extender: Otilio Connors in Treatment: 13 Wound Status Wound Number: 34 Primary Venous Leg Ulcer Etiology: Wound Location: Right, Anterior Lower Leg Wound Healed - Epithelialized Wounding Event: Gradually Appeared Status: Date Acquired: 11/14/2020 Comorbid Lymphedema, Deep Vein Thrombosis, Hypertension, Peripheral Weeks Of Treatment: 5 History: Venous Disease, Type II Diabetes Clustered Wound: Yes Photos Wound Measurements Length: (cm) Width: (cm) Depth: (cm) Clustered  Quantity: Area: (cm) Volume: (cm) 0 % Reduction in Area: 100% 0 % Reduction in Volume: 100% 0 Epithelialization: Small (1-33%) 3 0 0 Wound Description Classification: Full Thickness Without Exposed Support Structures Wound Margin: Distinct, outline attached Exudate Amount: Medium Exudate Type: Serosanguineous Exudate Color: red, brown Foul Odor After Cleansing: No Slough/Fibrino Yes Wound Bed Granulation Amount: Large (67-100%) Exposed Structure Granulation Quality: Red, Pink Fascia Exposed: No Necrotic Amount: Small (1-33%) Fat Layer (Subcutaneous Tissue) Exposed: Yes Necrotic Quality: Adherent Slough Tendon Exposed: No Muscle Exposed: No Joint Exposed: No Bone Exposed: No Treatment Notes Wound #34 (Lower Leg) Wound Laterality: Right, Anterior Cleanser Peri-Wound [name] Topical Primary Dressing Secondary Dressing Secured With Compression Wrap Compression Stockings Add-Ons Electronic Signature(s) Signed: 12/19/2020 5:02:53 PM By: Fonnie Mu RN Entered By: Fonnie Mu on 12/19/2020 15:30:53 -------------------------------------------------------------------------------- Vitals Details Patient Name: Date of Service: [name], Shawn Oppenheim [name]. 12/19/2020 2:45 PM Medical Record Number: [id number] Patient Account Number: [id number] Date of Birth/Sex: Treating RN: [birth year] (40 y.o. [name], [name] Mimi Debellis: Georganna Skeans Other Clinician: Referring Chanette Demo: Treating Engelbert Sevin/Extender: Otilio Connors in Treatment: 13 Vital Signs Time Taken: 15:21 Temperature (F): 99.2 Height (in): 74 Pulse (bpm): 74 Weight (lbs): 425 Respiratory Rate (breaths/min): 17 Body Mass Index (BMI): 54.6 Blood Pressure (mmHg): 175/89 Reference Range: 80 - 120 mg / dl Electronic Signature(s) Signed: 12/19/2020 5:02:53 PM By: Fonnie Mu RN Entered By: Fonnie Mu on 12/19/2020 15:21:56

## 2020-12-19 NOTE — Progress Notes (Signed)
Paugh, Gaylan T ([id number]) . Visit Report for 12/19/2020 Chief Complaint Document Details Patient Name: Date of Service: [name] 12/19/2020 2:45 PM Medical Record Number: [id number] Patient Account Number: [id number] Date of Birth/Sex: Treating RN: June 07, 1980 (40 y.o. [name] Primary Care Provider: Georganna Skeans Other Clinician: Referring Provider: Treating Provider/Extender: Otilio Connors in Treatment: 13 Information Obtained from: Patient Chief Complaint pt has blockage or absence of r iliac vein 05/22/2018; patient is here for review of wound on his right lower extremity 09/19/2020; patient is again here for wound review of wounds on his right lower leg Electronic Signature(s) Signed: 12/19/2020 4:28:54 PM By: Geralyn Corwin DO Entered By: Geralyn Corwin on 12/19/2020 16:24:37 -------------------------------------------------------------------------------- HPI Details Patient Name: Date of Service: [name], Shawn Oppenheim T. 12/19/2020 2:45 PM Medical Record Number: [id number] Patient Account Number: [id number] Date of Birth/Sex: Treating RN: November 03, 1980 (40 y.o. [name] Primary Care Provider: Georganna Skeans Other Clinician: Referring Provider: Treating Provider/Extender: Otilio Connors in Treatment: 13 History of Present Illness HPI Description: The patient is a 40 yrs old bm here for evaluation of his right leg ulcer. He has an extensive history of lymphedema and ulcers. He is being treated at the [facility] by Dr. [name] with [name] boots and the [facility]. He has pumps at home but he is only using them once a day. 08/30/14 selective debridement done of surface eschar. The wound cleans up quite nicely in the bed of this looks healthy. He is using a wound VAC under an Unna wrap. 11/17/14; selective debridement done of surface eschar. Once again the wound beds at clean up quite nicely. He is no longer using a wound VAC.  Recent dressing changes include Hydrofera Blue. Apparently he has done a wide variety of different topical dressings including advanced treatment options like Apligraf's without success. 03/21/15; surgical debridement done of surface eschar and nonviable subcutaneous tissue. The area on the medial aspect of the right leg has closed and the wound on the lateral right leg looks improved has been using Silver Collagen 04/11/15; I think attendance here is sporadic. The area on the right medial leg remains healed. He has a new tiny area on the back of the right leg. Again a surgical debridement of the surface eschar and nonviable subcutaneous tissue of the major wound on the right lateral leg. He has been using Silver Collagen and at home, he does his own Unna wraps at home edema control seems reasonable. 04/20/15; the area on the right medial leg has a very superficial area which may not even be open nevertheless I felt needed to be dressed today [this is his original chronic wound] the new wound is on the right anterior lateral leg is underwent a surgical debridement. He has a small wound on the right posterior leg. He puts on his own Unna boots, according to our nurses he does this fairly well. We have been using Silver collagen. 05/02/2015 -- I understand in the past his venous studies have been done at [facility] and he was advised weight loss before they would attempt to tackle his iliac vein blockage. He has not gone back for review. 06/27/2015 -- we have applied for Apligraf and are awaiting his insurance clearance. 07/25/2015 -- he still has to use her back from his insurance agent regarding his copayment for his Apligraf. 07/31/2015 -- the patient got a reply from the insurance agent regarding the dollar payment for his application  but is not sure whether it is for 5 or for one. 11/28/2015 -- has been hurting a little bit more and he has had change in color of his wound especially on the medial  part. 12/05/2015 -- his culture was positive for Proteus mirabilis and K Oxytoca which are sensitive to ciprofloxacin which he is already on. 10/3/17still on ciprofloxacin. His mother reports of dressing had to be changed last Friday due to odor. He has not been systemically unwell 12/19/15; patient came in today complaining of increasing pain and tightness in his upper right thigh. He completed the ciprofloxacin 2 or 3 days ago. He is not running a fever today. 12/26/2015 -- last week he had been seen by Dr. [name] who got a lower extremity venous duplex evaluation done which did not show DVT or SVT in the right lower extremity. he had also put him on Augmentin in addition to the previous ciprofloxacin and he had received for 2 weeks 01/02/2016 -- -- was admitted to the hospital on 12/26/2015 and discharged on 12/29/2015 and was treated for cellulitis. He was also newly diagnosed with type 2 diabetes mellitus and outpatient monitoring and initiation of treatment was recommended. Patients hemoglobin A1c was 6.6 No osteomyelitis detected on x-rays and recent Doppler study was negative for DVT Oral doxycycline and Levaquin were recommended for the patient as an . outpatient for a 14 day treatment. IV Zosyn and vancomycin was given due to concerns of Pseudomonas infection while he was in hospital. 02/13/2016 -- he has not gone back to [facility] where he had his vascular opinion earlier and I have urged him to regroup with them to see if there is any surgical options available. 02/27/2016 -- has an appointment to see [facility] vascular surgeons on January 3 and may be seeing Dr. Verdie Drown 03/19/2016 -- I was not able to find any notes on Epic but the patient did go to the vascular clinic at [facility] and saw the PA to Dr. Verdie Drown. I understand she has recommended a CT scan and MRI to be done on February 1 and they would review this with him on the same day. 04/09/2016 -- was  admitted to the hospital on 03/20/2016 acute respiratory failure with hypoxia, abdominal discomfort with erythema, hypertensive urgency and chronic wound of the right leg with morbid obesity. he was discharged home on 04/05/2016 and was to continue on IV antibiotics for 18 more days follow-up with the wound center and continue with his cardiologist. he has lost approximately 130 pounds and diuresed over 48 L. He was treated with IV Zosyn and ID recommended he continue this for 3 weeks more. The notes from [facility] wound clinic were noted where he was seen by the PA and she had taken cultures which grew MSSA and Pseudomonas. She had recommended edema control with a 4-layer compression and also referred him to the lymphedema clinic. A CT venogram was also planned for the future. 04/16/2016 -- at [facility] a CT pelvic venogram runoff was done on 04/11/2016 -- IMPRESSION:-Limited study secondary to poor opacification of venous structures. No definite evidence of acute deep vein thrombosis. 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 -Extensive right iliac chain and right inguinal adenopathy, favored to be reactive/secondary to congestion. The patient continues on his IV antibiotics through his PICC line and has his cardiology opinion and also a bariatric surgery opinion coming up.  04/30/2016 -- He has completed his IV antibiotics through his PICC line. 05/14/2016 --he was seen by the PA, Ms Sluss, recommended alternate day dressing with compression and use Hibiclens and Dakin's solution with acetic acid on his wounds. 07/30/2016 -- he has still not got his custom-made compression stockings and I have urged him to go and get him self measured for these. 08/06/2016 -- he has been measured for his custom stocking and will get in 2 weeks. He has lost 20 pounds since March 08/27/2016 -- he has not got his custom stockings  yet and he tried another pair which has not fit well and he has had a lot of maceration increase the size of his wound. 09/03/2016 -- the patient says that he has not been very compliant with his dressing changes and his elevation and exercise and this week he is notices wound get rather large with maceration. 09/18/16; according to our intake nurse the measurements on this patient's wound are slightly larger. I note previous compliance concerns. I note that his wounds measured larger last week which was noted by Dr. Meyer Russel. Culture done last week was negative. 10/01/2016 -- the patient has received some lymphedema pumps which are new and he says he is being compliant with this. He is going to be away for 2 weeks on vacation to [facility] 10/15/2016 -- he returns after 2 weeks and tells me he has been diligent with his dressing and has lost 25 pounds over the last 3 months. 10/22/2016 -- his last hemoglobin A1c was 6.2 and he is now diet controlled. 11/19/2016 -- he has been having problems with his compression stockings which are custom made at [facility]. He has not yet been able to get them. He had taken out his compression because he had gone there and had no dressing on when he came here today READMISSION 05/22/2018 [name] is a now 40 year old man with a [place] history of wounds in his lower extremities secondary to chronic venous insufficiency with secondary lymphedema. He has had previous vein ligations on the right although I do not have this information in front of me. As I understand things he also has central venous obstruction with a vein bypass in 2005 I believe this was done at [facility]. He tells me over the last 2 months he has noted mid reopening in the right mid anterior lower extremity. This is a rectangular shaped wound which is kind of odd in terms of how that formed. He has been using silver alginate and Unna boots that we used on him when he was here in 2018. He buys his product  supply online thinks this is cheaper than ordering through intermediary's Past medical history; CHF, morbid obesity, hypertension, hyperlipidemia, chronic venous insufficiency, vein bypass in 2005 previous vein ablations or ligations. He has a stent in the right lower extremity. ABI in this clinic was 1.04. He is using his compression pumps at home 3/27; 2-week follow-up. Right lower extremity wounds related to chronic venous insufficiency and lymphedema. He has been using silver alginate under an Radio broadcast assistant. He change the [employer] himself at home last week. He is making nice progress 4/10; 2-week follow-up. Right lower extremity wound is just about closed a small open area in the middle of the and large rectangular wound he had at the beginning. His edema control is excellent. He says he is using his compression pumps religiously 4/17; 2-week follow-up. Right lower extremity wound is totally closed. He had a large  rectangular wound which is progressively closed. His edema control is very good. He is using his compression pumps once to twice a day READMISSION 09/19/2020 [name] is now a 40 year old man we have had for several stays in this clinic including 2017, 2018 and most recently in 2020 with a wound on his right lower leg. He has compression pumps which he claims to be using twice a day. He also has stockings however I am not sure how much he uses them. Things have broken down over the last month or 2 he has an extensive wound area on the right mid tibial area I think this is similar to what he has had in the past. He has been using Xeroform and an Ace wrap that his girlfriend is applying. Past medical history; the patient has known chronic venous insufficiency with secondary lymphedema. He had an iliac vein blockage and he had a bypass at [facility] although the patient is not followed up with them. The surgeon may have been Dr. Jacolyn Reedy. As noted he has lymphedema pumps. Type 2 diabetes congestive  heart failure obstructive sleep apnea. He has a history of a right leg DVT although this may have been the iliac vein he is not currently on anticoagulation ABI in our clinic is 1.37 on the right Imaging Results - in this encounter CT Pelvic Venogram Run Off Imaging Results - CT Pelvic Venogram Run Off Impressions Performed At -Limited study secondary to poor opacification of venous structures. Nodefinite evidence of acute deep vein thrombosis. 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 -Extensive right iliac chain and right inguinal adenopathy, favored to bereactive/secondary to congestion. [facility] Imaging Results - CT Pelvic Venogram Run Off Narrative Performed At EXAM: CT PELVIC VENOGRAM RUN OFF DATE: 04/11/2016 1:28 PM ACCESSION: [id number] UN DICTATED: 04/11/2016 2:18 PM INTERPRETATION LOCATION: [facility] CLINICAL INDICATION: 40 years old Male with h/o RLE DVT and massive lymphedema of both LE's. Eval for venous outflow obstruction vs extrinsic mass- L97.203-Skin ulcer of calf with necrosis of muscle, unspecified laterality(RAF-HCC) COMPARISON: CT abdomen pelvis dated 02/11/2007 TECHNIQUE: A spiral CT scan was obtained approximately 3 minutes after administration of IV contrast from the kidneys to the knees.Images were reconstructed in the axial plane. Multiplanar reformatted and MIP images were provided for further evaluation of the vessels. For selected cases, 3D volumerendered images are also provided. VASCULAR FINDINGS: There is overall poor contrast opacification of the venous structures, limitingevaluation. IVC: No evidence of thrombus. Iliac veins: The right iliac veins are chronically occluded/diminutive in size. There are extensive venous collaterals noted throughout the right pelvis extending into the right lower extremity. The number and caliber of the venous collaterals have  increased when compared to 02/11/2007. The left iliac veins appear patent. There are also small caliber venous collaterals within the left pelvis extending into the left lower extremity. Anterior abdominal wall venouscollaterals are also noted but fully imaged secondary to patient diameter. The bilateral femoral and popliteal veins appear patent. There is no evidence of acute thrombus. The arterial vasculature is unremarkable on this venous phase time study. NONVASCULAR FINDINGS: LOWER CHEST Unremarkable. : ABDOMEN: HEPATOBILIARY: Unremarkable liver. No biliary ductal dilatation. Gallbladder isremarkable. PANCREAS: Unremarkable. SPLEEN: Unremarkable. ADRENAL GLANDS: Unremarkable. KIDNEYS/URETERS: Unremarkable. BLADDER: Unremarkable. BOWEL/PERITONEUM/RETROPERITONEUM: No bowel obstruction. No acute inflammatoryprocess. No ascites. LYMPH NODES: Extensive right iliac chain and right inguinal adenopathy. A nodal conglomerate in the right inguinal region measures 6.8 x 3.4 cm (2:124). This is  only slightly larger when compared to 02/11/2007. Given interval stabilityover 10 years, these are favored to be reactive/secondary to congestion. REPRODUCTIVE: Unremarkable. BONES/SOFT TISSUES: No acute osseous abnormalities. Postsurgical changes in the right inguinal and left medial thigh region. Right greater than left softtissue edema throughout the lower extremities. [facility] Imaging Results - CT Pelvic Venogram Run Off Procedure Note Interface, Rad Results In - 04/11/2016 3:28 PM EST EXAM: CT PELVIC VENOGRAM RUN OFF DATE: 04/11/2016 1:28 PM ACCESSION: [id number] UN DICTATED: 04/11/2016 2:18 PM INTERPRETATION LOCATION: [facility] CLINICAL INDICATION: 40 years old Male with h/o RLE DVT and massive lymphedema of both LE's. Eval for venous outflow obstruction vs extrinsic mass- L97.203-Skin ulcer of calf with necrosis of muscle, unspecified laterality (RAF-HCC) COMPARISON: CT abdomen pelvis dated  02/11/2007 TECHNIQUE: A spiral CT scan was obtained approximately 3 minutes after administration of IV contrast from the kidneys to the knees. Images were reconstructed in the axial plane. Multiplanar reformatted and MIP images were provided for further evaluation of the vessels. For selected cases, 3D volume rendered images are also provided. VASCULAR FINDINGS: There is overall poor contrast opacification of the venous structures, limiting evaluation. IVC: No evidence of thrombus. Iliac veins: The right iliac veins are chronically occluded/diminutive in size. There are extensive venous collaterals noted throughout the right pelvis extending into the right lower extremity. The number and caliber of the venous collaterals have increased when compared to 02/11/2007. The left iliac veins appear patent. There are also small caliber venous collaterals within the left pelvis extending into the left lower extremity. Anterior abdominal wall venous collaterals are also noted but fully imaged secondary to patient diameter. The bilateral femoral and popliteal veins appear patent. There is no evidence of acute thrombus. The arterial vasculature is unremarkable on this venous phase time study. NONVASCULAR FINDINGS: LOWER CHEST Unremarkable. : ABDOMEN: HEPATOBILIARY: Unremarkable liver. No biliary ductal dilatation. Gallbladder is remarkable. PANCREAS: Unremarkable. SPLEEN: Unremarkable. ADRENAL GLANDS: Unremarkable. KIDNEYS/URETERS: Unremarkable. BLADDER: Unremarkable. BOWEL/PERITONEUM/RETROPERITONEUM: No bowel obstruction. No acute inflammatory process. No ascites. LYMPH NODES: Extensive right iliac chain and right inguinal adenopathy. A nodal conglomerate in the right inguinal region measures 6.8 x 3.4 cm (2:124). This is only slightly larger when compared to 02/11/2007. Given interval stability over 10 years, these are favored to be reactive/secondary to congestion. REPRODUCTIVE:  Unremarkable. BONES/SOFT TISSUES: No acute osseous abnormalities. Postsurgical changes in the right inguinal and left medial thigh region. Right greater than left soft tissue edema throughout the lower extremities. IMPRESSION: -Limited study secondary to poor opacification of venous structures. No definite evidence of acute deep vein thrombosis. 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 -Extensive right iliac chain and right inguinal adenopathy, favored to be reactive/secondary to congestion. Imaging Results - CT Pelvic Venogram Run Off Performing Organization Address City/State/Zipcode Phone Number [facility] [address] [place], [place] [address] Surgical summary as listed below; Assessment: [name] is a 40 y.o. male with Arth history of right lower extremity chronic obstructive deep venous disease with recurrent ulceration, absence of right iliac vein, prior left to right femoral vein bypass. Patient continues with recurrent ulceration, his weight precludes him from further vascular studies at this time. If he is able to lose 50-100 pounds we would be able to do left lower extremity venogram possible intervention with 50% chance of re- opening occlusion per Dr. Jacolyn Reedy. For global health, weight loss, potential improvement in his chronic venous insufficiency would appreciateinput from bariatric  surgery group for recommendations. 09/26/20; patient comes in today with not much improvement. He has also some odor. 3 open areas and this linear area in his mid calf require debridement. We have been using silver alginate under compression. I gave him Keflex last week for what I thought was cellulitis in the right medial thigh he is completing this and I think this is better. 7/26; patient comes in today with again necrotic debris over these wounds. According to our intake nurse extreme malodor and  drainage. We have been using silver alginate under compression. He is complaining about pain in the wound area. 7/29; Patient presents for follow-up. He reports improvement to the wound. He has finished taking Keflex. He has no issues or complaints today. 10/17/2020 upon evaluation today patient appears to be doing poorly in regard to his leg ulcer. He has been tolerating the dressing changes without complication. Fortunately there does not appear to be any signs of active infection at this time. No fevers, chills, nausea, vomiting, or diarrhea. 8/23; the patient has 2 areas anteriorly and a larger area medially on the right lower leg. He comes in today with his compression wrap slipping down. We do not have good edema control. We have been using silver alginate. I believe he completed a course of antibioticso Levaquin given to him 2 weeks ago at his last visit. He states his girlfriend who is a nurse changes his dressings. We have not seen this in 2 weeks. He just received a compounded topical antibiotic based on I believe a deep tissue culture that was done. I do not have a result of the deep tissue culture today and he forgot the compounded antibiotic. I will try to clarify this next time I tried to send him back to vascular at [facility] for review of his central venous obstruction although they would not even see him I think because of noncompliance, or at least perceived noncompliance with regards to the recommendations for weight loss, consideration of bariatric assessment for surgery etc. 8/30; although the patient's wounds anteriorly on the right lower leg extending medially were debrided today. Better looking wound surface he has better edema control still under 4-layer compression. He tells me he is using his compression pump twice a day however they are greater than 71 years old and he might benefit from a new set. He is using his Jodie Echevaria compounded antibiotic Since the last time he was here he  saw Dr. Durwin Nora at vein and vascular at our request. He was felt to have adequate arterial flow for healing although there was still the issue about whether he might need an angiogram if things still. 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 He did not talk about the s previous central venous procedures that have been done at [facility]. That information is available in care everywhere however based on the duplex exam he was not felt to be a candidate for any venous procedures on the right. He has no significant venous reflux on the right and has had previously had a right greater saphenous vein ablated 9/6; the patient has a cluster of small areas over the right anterior mid tibia as well as an area medially. That the air 1 medially is more confluent all of them look like they have a better surface. We have been putting him in 4-layer compression and using his compression pumps twice a day 9/13; both of the patient's wound areas appear somewhat better especially the  large area medially. 9/27; 2-week follow-up. His wife who is a Designer, jewellery has been changing his 4-layer compression with Hydrofera Blue and [place] I think the measurements of the 2 remaining areas 1 on the right anterior tibia and 1 on the right lateral are better. In another setting I might consider advanced treatment product here. He is using his compression pumps 10/11; patient presents for follow-up. He has no issues or complaints today. His girlfriend continues to change his compression wrap with Hydrofera Blue and [place]. He denies signs of infection. Electronic Signature(s) Signed: 12/19/2020 4:28:54 PM By: Geralyn Corwin DO Entered By: Geralyn Corwin on 12/19/2020 16:25:14 -------------------------------------------------------------------------------- Physical Exam Details Patient Name: Date of Service: [name], Shawn Oppenheim T. 12/19/2020 2:45 PM Medical Record Number: [id number] Patient Account Number:  [id number] Date of Birth/Sex: Treating RN: January 13, 1981 (40 y.o. [name] Primary Care Provider: Georganna Skeans Other Clinician: Referring Provider: Treating Provider/Extender: Otilio Connors in Treatment: 13 Constitutional respirations regular, non-labored and within target range for patient.. Cardiovascular 2+ dorsalis pedis/posterior tibialis pulses. Psychiatric pleasant and cooperative. Notes Patient has 1 wound to the right medial lower leg with granulation tissue present. No evidence of surrounding infection. Electronic Signature(s) Signed: 12/19/2020 4:28:54 PM By: Geralyn Corwin DO Entered By: Geralyn Corwin on 12/19/2020 16:26:18 -------------------------------------------------------------------------------- Physician Orders Details Patient Name: Date of Service: [name], A DRIA Dorris Carnes T. 12/19/2020 2:45 PM Medical Record Number: [id number] Patient Account Number: [id number] Date of Birth/Sex: Treating RN: 15-Oct-1980 (40 y.o. [name], [name] Primary Care Provider: Georganna Skeans Other Clinician: Referring Provider: Treating Provider/Extender: Otilio Connors in Treatment: [phone] Verbal / Phone Orders: No Diagnosis Coding ICD-10 Coding Code Description I87.331 Chronic venous hypertension (idiopathic) with ulcer and inflammation of right lower extremity I89.0 Lymphedema, not elsewhere classified L97.818 Non-pressure chronic ulcer of other part of right lower leg with other specified severity Follow-up Appointments ppointment in 2 weeks. - Dr. [name] Return A Bathing/ Shower/ Hygiene May shower with protection but do not get wound dressing(s) wet. - May use a cast wrap to put over wraps to shower; you can buy these at [facility] or [facility] Edema Control - Lymphedema / SCD / Other Lymphedema Pumps. Use Lymphedema pumps on leg(s) 2-3 times a day for 45-60 minutes. If wearing any wraps or hose, do not remove them.  Continue exercising as instructed. Elevate legs to the level of the heart or above for 30 minutes daily and/or when sitting, a frequency of: - throughout the day Avoid standing for Butcher periods of time. Patient to wear own compression stockings every day. - on Left Leg Wound Treatment Wound #32 - Lower Leg Wound Laterality: Right, Medial Cleanser: Soap and Water 2 x Per Week/15 Days Discharge Instructions: May shower and wash wound with dial antibacterial soap and water prior to dressing change. Cleanser: Wound Cleanser (Generic) 2 x Per Week/15 Days Discharge Instructions: Cleanse the wound with wound cleanser prior to applying a clean dressing using gauze sponges, not tissue or cotton balls. Peri-Wound Care: Triamcinolone 15 (g) 2 x Per Week/15 Days Discharge Instructions: Use triamcinolone 15 (g) as directed Peri-Wound Care: Sween Lotion (Moisturizing lotion) 2 x Per Week/15 Days Discharge Instructions: Apply moisturizing lotion as directed Topical: Keystone antibiotic gel 2 x Per Week/15 Days Discharge Instructions: Apply directly to wound bed, under primary dressing Prim Dressing: Hydrofera Blue Classic Foam, 4x4 in (Generic) 2 x Per Week/15 Days ary Discharge Instructions: Moisten with saline prior to applying to wound bed Secondary Dressing:  ABD Pad, 5x9 (Generic) 2 x Per Week/15 Days Discharge Instructions: Apply over primary dressing as directed. Secondary Dressing: Zetuvit Plus 4x8 in (Generic) 2 x Per Week/15 Days Discharge Instructions: Apply over primary dressing as directed. Secondary Dressing: CarboFLEX Odor Control Dressing, 4x4 in (Generic) 2 x Per Week/15 Days Discharge Instructions: Apply over primary dressing as directed. Compression Wrap: CoFlex TLC XL 2-layer Compression System 4x7 (in/yd) 2 x Per Week/15 Days Discharge Instructions: Apply CoFlex 2-layer compression as directed. (alt for 4 layer) Electronic Signature(s) Signed: 12/19/2020 4:28:54 PM By: Geralyn Corwin DO Entered By: Geralyn Corwin on 12/19/2020 16:26:34 -------------------------------------------------------------------------------- Problem List Details Patient Name: Date of Service: [name], Shawn Oppenheim T. 12/19/2020 2:45 PM Medical Record Number: [id number] Patient Account Number: [id number] Date of Birth/Sex: Treating RN: 1980-10-18 (40 y.o. [name] Primary Care Provider: Georganna Skeans Other Clinician: Referring Provider: Treating Provider/Extender: Otilio Connors in Treatment: 13 Active Problems ICD-10 Encounter Code Description Active Date MDM Diagnosis I87.331 Chronic venous hypertension (idiopathic) with ulcer and inflammation of right 09/19/2020 No Yes lower extremity I89.0 Lymphedema, not elsewhere classified 09/19/2020 No Yes L97.818 Non-pressure chronic ulcer of other part of right lower leg with other specified 09/19/2020 No Yes severity Inactive Problems Resolved Problems Electronic Signature(s) Signed: 12/19/2020 4:28:54 PM By: Geralyn Corwin DO Entered By: Geralyn Corwin on 12/19/2020 16:20:04 -------------------------------------------------------------------------------- Progress Note Details Patient Name: Date of Service: [name], Shawn Oppenheim T. 12/19/2020 2:45 PM Medical Record Number: [id number] Patient Account Number: [id number] Date of Birth/Sex: Treating RN: Nov 07, 1980 (40 y.o. 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 He has an extensive history of lymphedema and ulcers. He is being treated at the [facility] by Dr. [name] with [name] boots and the [facility] He has pumps at home but he is only using them once a day. 08/30/14 selective debridement done of surface eschar. The wound cleans up quite nicely in the bed of this looks healthy. He is using a wound VAC under an Unna wrap. 11/17/14; selective debridement done of surface eschar. Once again the wound beds at clean up quite nicely. He is no longer using a wound VAC. Recent dressing changes include Hydrofera Blue. Apparently he has done a wide variety of different topical dressings including advanced treatment options like Apligraf's without success. 03/21/15; surgical debridement done of surface eschar and nonviable subcutaneous tissue. The area on the medial aspect of the right leg has closed and the wound on the lateral right leg looks improved has been using Silver Collagen 04/11/15; I think attendance here is sporadic. The area on the right medial leg remains healed. He has a new tiny area on the back of the right leg. Again a surgical debridement of the surface eschar and nonviable subcutaneous tissue of the major wound on the right lateral leg. He has been using Silver Collagen and at home, he does his own Unna wraps at home edema control seems reasonable. 04/20/15; the area on the right medial leg has a very superficial area which may not even  be open nevertheless I felt needed to be dressed today [this is his original chronic wound] the new wound is on the right anterior lateral leg is underwent a surgical debridement. He has a small wound on the right posterior leg. He puts on his own Unna boots, according to our nurses he does this fairly well. We have been using Silver collagen. 05/02/2015 -- I understand in the past his venous studies have been done at [facility] and he was advised weight loss before they would attempt to tackle his iliac vein blockage. He has  not gone back for review. 06/27/2015 -- we have applied for Apligraf and are awaiting his insurance clearance. 07/25/2015 -- he still has to use her back from his insurance agent regarding his copayment for his Apligraf. 07/31/2015 -- the patient got a reply from the insurance agent regarding the dollar payment for his application but is not sure whether it is for 5 or for one. 11/28/2015 -- has been hurting a little bit more and he has had change in color of his wound especially on the medial part. 12/05/2015 -- his culture was positive for Proteus mirabilis and K Oxytoca which are sensitive to ciprofloxacin which he is already on. 12/12/15oostill on ciprofloxacin. His mother reports of dressing had to be changed last Friday due to odor. He has not been systemically unwell 12/19/15; patient came in today complaining of increasing pain and tightness in his upper right thigh. He completed the ciprofloxacin 2 or 3 days ago. He is not running a fever today. 12/26/2015 -- last week he had been seen by Dr. [name] who got a lower extremity venous duplex evaluation done which did not show DVT or SVT in the right lower extremity. he had also put him on Augmentin in addition to the previous ciprofloxacin and he had received for 2 weeks 01/02/2016 -- -- was admitted to the hospital on 12/26/2015 and discharged on 12/29/2015 and was treated for cellulitis. He was also newly diagnosed with type 2 diabetes mellitus and outpatient monitoring and initiation of treatment was recommended. Patientoos hemoglobin A1c was 6.6 No osteomyelitis detected on x-rays and recent Doppler study was negative for DVT Oral doxycycline and Levaquin were recommended for the patient as an . outpatient for a 14 day treatment. IV Zosyn and vancomycin was given due to concerns of Pseudomonas infection while he was in hospital. 02/13/2016 -- he has not gone back to [facility] where he had his vascular opinion earlier and I have urged  him to regroup with them to see if there is any surgical options available. 02/27/2016 -- has an appointment to see [facility] vascular surgeons on January 3 and may be seeing Dr. Verdie Drown 03/19/2016 -- I was not able to find any notes on Epic but the patient did go to the vascular clinic at [facility] and saw the PA to Dr. Verdie Drown. I understand she has recommended a CT scan and MRI to be done on February 1 and they would review this with him on the same day. 04/09/2016 -- was admitted to the hospital on 03/20/2016 acute respiratory failure with hypoxia, abdominal discomfort with erythema, hypertensive urgency and chronic wound of the right leg with morbid obesity. he was discharged home on 04/05/2016 and was to continue on IV antibiotics for 18 more days follow-up with the wound center and continue with his cardiologist. he has lost approximately 130 pounds and diuresed over 48 L. He was treated with  IV Zosyn and ID recommended he continue this for 3 weeks more. The notes from [facility] wound clinic were noted where he was seen by the PA and she had taken cultures which grew MSSA and Pseudomonas. She had recommended edema control with a 4-layer compression and also referred him to the lymphedema clinic. A CT venogram was also planned for the future. 04/16/2016 -- at [facility] a CT pelvic venogram runoff was done on 04/11/2016 -- IMPRESSION:-Limited study secondary to poor opacification of venous structures. No definite evidence of acute deep vein thrombosis. 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 -Extensive right iliac chain and right inguinal adenopathy, favored to be reactive/secondary to congestion. The patient continues on his IV antibiotics through his PICC line and has his cardiology opinion and also a bariatric surgery opinion coming up. 04/30/2016 -- He has completed his IV  antibiotics through his PICC line. 05/14/2016 --he was seen by the PA, Ms Sluss, recommended alternate day dressing with compression and use Hibiclens and Dakin's solution with acetic acid on his wounds. 07/30/2016 -- he has still not got his custom-made compression stockings and I have urged him to go and get him self measured for these. 08/06/2016 -- he has been measured for his custom stocking and will get in 2 weeks. He has lost 20 pounds since March 08/27/2016 -- he has not got his custom stockings yet and he tried another pair which has not fit well and he has had a lot of maceration increase the size of his wound. 09/03/2016 -- the patient says that he has not been very compliant with his dressing changes and his elevation and exercise and this week he is notices wound get rather large with maceration. 09/18/16; according to our intake nurse the measurements on this patient's wound are slightly larger. I note previous compliance concerns. I note that his wounds measured larger last week which was noted by Dr. Meyer Russel. Culture done last week was negative. 10/01/2016 -- the patient has received some lymphedema pumps which are new and he says he is being compliant with this. He is going to be away for 2 weeks on vacation to [facility] 10/15/2016 -- he returns after 2 weeks and tells me he has been diligent with his dressing and has lost 25 pounds over the last 3 months. 10/22/2016 -- his last hemoglobin A1c was 6.2 and he is now diet controlled. 11/19/2016 -- he has been having problems with his compression stockings which are custom made at [facility]. He has not yet been able to get them. He had taken out his compression because he had gone there and had no dressing on when he came here today READMISSION 05/22/2018 [name] is a now 40 year old man with a [place] history of wounds in his lower extremities secondary to chronic venous insufficiency with secondary lymphedema. He has had previous  vein ligations on the right although I do not have this information in front of me. 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 He tells me over the last 2 months he has noted mid reopening in the right mid anterior lower extremity. This is a rectangular shaped wound which is kind of odd in terms of how that formed. He has been using silver alginate and Unna boots that we used on him when he was here in 2018. He  buys his product supply online thinks this is cheaper than ordering through intermediary's Past medical history; CHF, morbid obesity, hypertension, hyperlipidemia, chronic venous insufficiency, vein bypass in 2005 previous vein ablations or ligations. He has a stent in the right lower extremity. ABI in this clinic was 1.04. He is using his compression pumps at home 3/27; 2-week follow-up. Right lower extremity wounds related to chronic venous insufficiency and lymphedema. He has been using silver alginate under an Radio broadcast assistant. He change the [employer] himself at home last week. He is making nice progress 4/10; 2-week follow-up. Right lower extremity wound is just about closed a small open area in the middle of the and large rectangular wound he had at the beginning. His edema control is excellent. He says he is using his compression pumps religiously 4/17; 2-week follow-up. Right lower extremity wound is totally closed. He had a large rectangular wound which is progressively closed. His edema control is very good. He is using his compression pumps once to twice a day READMISSION 09/19/2020 Mr. Cofer is now a 40 year old man we have had for several stays in this clinic including 2017, 2018 and most recently in 2020 with a wound on his right lower leg. He has compression pumps which he claims to be using twice a day. He also has stockings however I am not sure how much he uses them. Things have broken down over the last  month or 2 he has an extensive wound area on the right mid tibial area I think this is similar to what he has had in the past. He has been using Xeroform and an Ace wrap that his girlfriend is applying. Past medical history; the patient has known chronic venous insufficiency with secondary lymphedema. He had an iliac vein blockage and he had a bypass at [facility] although the patient is not followed up with them. The surgeon may have been Dr. Jacolyn Reedy. As noted he has lymphedema pumps. Type 2 diabetes congestive heart failure obstructive sleep apnea. He has a history of a right leg DVT although this may have been the iliac vein he is not currently on anticoagulation ABI in our clinic is 1.37 on the right Imaging Results - in this encounter CT Pelvic Venogram Run Off Imaging Results - CT Pelvic Venogram Run Off Impressions Performed At -Limited study secondary to poor opacification of venous structures. Nodefinite evidence of acute deep vein thrombosis. 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 -Extensive right iliac chain and right inguinal adenopathy, favored to bereactive/secondary to congestion. [facility] Imaging Results - CT Pelvic Venogram Run Off Narrative Performed At EXAM: CT PELVIC VENOGRAM RUN OFF DATE: 04/11/2016 1:28 PM ACCESSION: [id number] UN DICTATED: 04/11/2016 2:18 PM INTERPRETATION LOCATION: [facility] CLINICAL INDICATION: 40 years old Male with h/o RLE DVT and massive lymphedema of both LE's. Eval for venous outflow obstruction vs extrinsic mass- L97.203-Skin ulcer of calf with necrosis of muscle, unspecified laterality(RAF-HCC) COMPARISON: CT abdomen pelvis dated 02/11/2007 TECHNIQUE: A spiral CT scan was obtained approximately 3 minutes after administration of IV contrast from the kidneys to the knees. Images were reconstructed in the axial plane. Multiplanar reformatted and MIP  images were provided for further evaluation of the vessels. For selected cases, 3D volumerendered images are also provided. VASCULAR FINDINGS: There is overall poor contrast opacification of the venous structures, limitingevaluation. IVC: No evidence of thrombus. Iliac veins: The right iliac veins are chronically occluded/diminutive in size. There are  extensive venous collaterals noted throughout the right pelvis extending into the right lower extremity. The number and caliber of the venous collaterals have increased when compared to 02/11/2007. The left iliac veins appear patent. There are also small caliber venous collaterals within the left pelvis extending into the left lower extremity. Anterior abdominal wall venouscollaterals are also noted but fully imaged secondary to patient diameter. The bilateral femoral and popliteal veins appear patent. There is no evidence of acute thrombus. The arterial vasculature is unremarkable on this venous phase time study. NONVASCULAR FINDINGS: LOWER CHEST Unremarkable. : ABDOMEN: HEPATOBILIARY: Unremarkable liver. No biliary ductal dilatation. Gallbladder isremarkable. PANCREAS: Unremarkable. SPLEEN: Unremarkable. ADRENAL GLANDS: Unremarkable. KIDNEYS/URETERS: Unremarkable. BLADDER: Unremarkable. BOWEL/PERITONEUM/RETROPERITONEUM: No bowel obstruction. No acute inflammatoryprocess. No ascites. LYMPH NODES: Extensive right iliac chain and right inguinal adenopathy. A nodal conglomerate in the right inguinal region measures 6.8 x 3.4 cm (2:124). This is only slightly larger when compared to 02/11/2007. Given interval stabilityover 10 years, these are favored to be reactive/secondary to congestion. REPRODUCTIVE: Unremarkable. BONES/SOFT TISSUES: No acute osseous abnormalities. Postsurgical changes in the right inguinal and left medial thigh region. Right greater than left softtissue edema throughout the lower extremities. [facility] Imaging Results - CT  Pelvic Venogram Run Off Procedure Note Interface, Rad Results In - 04/11/2016 3:28 PM EST EXAM: CT PELVIC VENOGRAM RUN OFF DATE: 04/11/2016 1:28 PM ACCESSION: [id number] UN DICTATED: 04/11/2016 2:18 PM INTERPRETATION LOCATION: [facility] CLINICAL INDICATION: 40 years old Male with h/o RLE DVT and massive lymphedema of both LE's. Eval for venous outflow obstruction vs extrinsic mass- L97.203-Skin ulcer of calf with necrosis of muscle, unspecified laterality (RAF-HCC) COMPARISON: CT abdomen pelvis dated 02/11/2007 TECHNIQUE: A spiral CT scan was obtained approximately 3 minutes after administration of IV contrast from the kidneys to the knees. Images were reconstructed in the axial plane. Multiplanar reformatted and MIP images were provided for further evaluation of the vessels. For selected cases, 3D volume rendered images are also provided. VASCULAR FINDINGS: There is overall poor contrast opacification of the venous structures, limiting evaluation. IVC: No evidence of thrombus. Iliac veins: The right iliac veins are chronically occluded/diminutive in size. There are extensive venous collaterals noted throughout the right pelvis extending into the right lower extremity. The number and caliber of the venous collaterals have increased when compared to 02/11/2007. The left iliac veins appear patent. There are also small caliber venous collaterals within the left pelvis extending into the left lower extremity. Anterior abdominal wall venous collaterals are also noted but fully imaged secondary to patient diameter. The bilateral femoral and popliteal veins appear patent. There is no evidence of acute thrombus. The arterial vasculature is unremarkable on this venous phase time study. NONVASCULAR FINDINGS: LOWER CHEST Unremarkable. : ABDOMEN: HEPATOBILIARY: Unremarkable liver. No biliary ductal dilatation. Gallbladder is remarkable. PANCREAS: Unremarkable. SPLEEN: Unremarkable. ADRENAL GLANDS:  Unremarkable. KIDNEYS/URETERS: Unremarkable. BLADDER: Unremarkable. BOWEL/PERITONEUM/RETROPERITONEUM: No bowel obstruction. No acute inflammatory process. No ascites. LYMPH NODES: Extensive right iliac chain and right inguinal adenopathy. A nodal conglomerate in the right inguinal region measures 6.8 x 3.4 cm (2:124). This is only slightly larger when compared to 02/11/2007. Given interval stability over 10 years, these are favored to be reactive/secondary to congestion. REPRODUCTIVE: Unremarkable. BONES/SOFT TISSUES: No acute osseous abnormalities. Postsurgical changes in the right inguinal and left medial thigh region. Right greater than left soft tissue edema throughout the lower extremities. IMPRESSION: -Limited study secondary to poor opacification of venous structures. No definite evidence of acute deep vein thrombosis. -Chronic occlusion of the right iliac  veins with interval increase in size and caliber of extensive pelvic/lower extremity venous collaterals. -Extensive right iliac chain and right inguinal adenopathy, favored to be reactive/secondary to congestion. Imaging Results - CT Pelvic Venogram Run Off Performing Organization Address City/State/Zipcode Phone Number [facility] [address] [place], [place] [address] Surgical summary as listed below; Assessment: [name] is a 40 y.o. male with [place] history of right lower extremity chronic obstructive deep venous disease with recurrent ulceration, absence of right iliac vein, prior left to right femoral vein bypass. Patient continues with recurrent ulceration, his weight precludes him from further vascular studies at this time. If he is able to lose 50-100 pounds we would be able to do left lower extremity venogram possible intervention with 50% chance of re- opening occlusion per Dr. Jacolyn Reedy. For global health, weight loss, potential improvement in his chronic venous insufficiency would appreciateinput from bariatric surgery group for  recommendations. 09/26/20; patient comes in today with not much improvement. He has also some odor. 3 open areas and this linear area in his mid calf require debridement. We have been using silver alginate under compression. I gave him Keflex last week for what I thought was cellulitis in the right medial thigh he is completing this and I think this is better. 7/26; patient comes in today with again necrotic debris over these wounds. According to our intake nurse extreme malodor and drainage. We have been using silver alginate under compression. He is complaining about pain in the wound area. 7/29; Patient presents for follow-up. He reports improvement to the wound. He has finished taking Keflex. He has no issues or complaints today. 10/17/2020 upon evaluation today patient appears to be doing poorly in regard to his leg ulcer. He has been tolerating the dressing changes without complication. Fortunately there does not appear to be any signs of active infection at this time. No fevers, chills, nausea, vomiting, or diarrhea. 8/23; the patient has 2 areas anteriorly and a larger area medially on the right lower leg. He comes in today with his compression wrap slipping down. We do not have good edema control. We have been using silver alginate. I believe he completed a course of antibioticso Levaquin given to him 2 weeks ago at his last visit. He states his girlfriend who is a nurse changes his dressings. We have not seen this in 2 weeks. He just received a compounded topical antibiotic based on I believe a deep tissue culture that was done. I do not have a result of the deep tissue culture today and he forgot the compounded antibiotic. I will try to clarify this next time I tried to send him back to vascular at [facility] for review of his central venous obstruction although they would not even see him I think because of noncompliance, or at least perceived noncompliance with regards to the recommendations for  weight loss, consideration of bariatric assessment for surgery etc. 8/30; although the patient's wounds anteriorly on the right lower leg extending medially were debrided today. Better looking wound surface he has better edema control still under 4-layer compression. He tells me he is using his compression pump twice a day however they are greater than 14 years old and he might benefit from a new set. He is using his Jodie Echevaria compounded antibiotic Since the last time he was here he saw Dr. Durwin Nora at vein and vascular at our request. He was felt to have adequate arterial flow for healing although there was still the issue about whether  he might need an angiogram if things still. 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 He did not talk about the s previous central venous procedures that have been done at [facility]. That information is available in care everywhere however based on the duplex exam he was not felt to be a candidate for any venous procedures on the right. He has no significant venous reflux on the right and has had previously had a right greater saphenous vein ablated 9/6; the patient has a cluster of small areas over the right anterior mid tibia as well as an area medially. That the air 1 medially is more confluent all of them look like they have a better surface. We have been putting him in 4-layer compression and using his compression pumps twice a day 9/13; both of the patient's wound areas appear somewhat better especially the large area medially. 9/27; 2-week follow-up. His wife who is a Designer, jewellery has been changing his 4-layer compression with Hydrofera Blue and Keystone I think the measurements of the 2 remaining areas 1 on the right anterior tibia and 1 on the right lateral are better. In another setting I might consider advanced treatment product here. He is using his compression pumps 10/11; patient presents for follow-up. He has no issues or complaints today.  His girlfriend continues to change his compression wrap with Hydrofera Blue and [place]. He denies signs of infection. Patient History Information obtained from Patient. Family History Diabetes - Mother, No family history of Cancer, Heart Disease, Hereditary Spherocytosis, Hypertension, Kidney Disease, Lung Disease, Seizures, Stroke, Thyroid Problems, Tuberculosis. Social History Unknown if ever smoked, Marital Status - Single, Alcohol Use - Rarely, Drug Use - No History. Medical History Eyes Denies history of Cataracts, Glaucoma, Optic Neuritis Ear/Nose/Mouth/Throat Denies history of Chronic sinus problems/congestion, Middle ear problems Hematologic/Lymphatic Patient has history of Lymphedema - right leg Denies history of Anemia, Hemophilia, Human Immunodeficiency Virus, Sickle Cell Disease Respiratory Denies history of Aspiration, Asthma, Chronic Obstructive Pulmonary Disease (COPD), Pneumothorax, Sleep Apnea, Tuberculosis Cardiovascular Patient has history of Deep Vein Thrombosis - hx right leg, Hypertension, Peripheral Venous Disease - venous insufficiency and varicosities Denies history of Angina, Arrhythmia, Congestive Heart Failure, Coronary Artery Disease, Hypotension, Myocardial Infarction, Peripheral Arterial Disease, Phlebitis, Vasculitis Gastrointestinal Denies history of Cirrhosis , Colitis, Crohnoos, Hepatitis A, Hepatitis B, Hepatitis C Endocrine Patient has history of Type II Diabetes Denies history of Type I Diabetes Genitourinary Denies history of End Stage Renal Disease Immunological Denies history of Lupus Erythematosus, Raynaudoos, Scleroderma Integumentary (Skin) Denies history of History of Burn Musculoskeletal Denies history of Gout, Rheumatoid Arthritis, Osteoarthritis, Osteomyelitis Neurologic Denies history of Dementia, Neuropathy, Quadriplegia, Paraplegia, Seizure Disorder Oncologic Denies history of Received Chemotherapy, Received  Radiation Psychiatric Denies history of Anorexia/bulimia, Confinement Anxiety Hospitalization/Surgery History - tonsillectomy. - right knee ligament repair. Medical A Surgical History Notes nd Constitutional Symptoms (General Health) morbid obesity Objective Constitutional respirations regular, non-labored and within target range for patient.. Vitals Time Taken: 3:21 PM, Height: 74 in, Weight: 425 lbs, BMI: 54.6, Temperature: 99.2 F, Pulse: 74 bpm, Respiratory Rate: 17 breaths/min, Blood Pressure: 175/89 mmHg. Cardiovascular 2+ dorsalis pedis/posterior tibialis pulses. Psychiatric pleasant and cooperative. General Notes: Patient has 1 wound to the right medial lower leg with granulation tissue present. No evidence of surrounding infection. Integumentary (Hair, Skin) Wound #32 status is Open. Original cause of wound was Gradually Appeared. The date acquired was: 07/11/2020. The wound has been in treatment 13 weeks. The wound is located  on the Right,Medial Lower Leg. The wound measures 2cm length x 4cm width x 0.2cm depth; 6.283cm^2 area and 1.257cm^3 volume. There is Fat Layer (Subcutaneous Tissue) exposed. There is no tunneling or undermining noted. There is a large amount of serosanguineous drainage noted. The wound margin is distinct with the outline attached to the wound base. There is large (67-100%) red granulation within the wound bed. There is a small (1-33%) amount of necrotic tissue within the wound bed including Adherent Slough. Wound #34 status is Healed - Epithelialized. Original cause of wound was Gradually Appeared. The date acquired was: 11/14/2020. The wound has been in treatment 5 weeks. The wound is located on the Right,Anterior Lower Leg. The wound measures 0cm length x 0cm width x 0cm depth; 0cm^2 area and 0cm^3 volume. There is Fat Layer (Subcutaneous Tissue) exposed. There is a medium amount of serosanguineous drainage noted. The wound margin is distinct with the  outline attached to the wound base. There is large (67-100%) red, pink granulation within the wound bed. There is a small (1-33%) amount of necrotic tissue within the wound bed including Adherent Slough. Assessment Active Problems ICD-10 Chronic venous hypertension (idiopathic) with ulcer and inflammation of right lower extremity Lymphedema, not elsewhere classified Non-pressure chronic ulcer of other part of right lower leg with other specified severity Patient's wound has shown improvement in size and appearance since last clinic visit. No signs of infection on exam. I recommended continuing Hydrofera Blue under compression wrap. He follows up every 2 weeks and his girlfriend changes the wrap on the week he has not seen. Procedures Wound #32 Pre-procedure diagnosis of Wound #32 is a Lymphedema located on the Right,Medial Lower Leg . There was a Four Layer Compression Therapy Procedure by Fonnie Mu, RN. Post procedure Diagnosis Wound #32: Same as Pre-Procedure Plan Follow-up Appointments: Return Appointment in 2 weeks. - Dr. [name] Bathing/ Shower/ Hygiene: May shower with protection but do not get wound dressing(s) wet. - May use a cast wrap to put over wraps to shower; you can buy these at [facility] or [facility] Edema Control - Lymphedema / SCD / Other: Lymphedema Pumps. Use Lymphedema pumps on leg(s) 2-3 times a day for 45-60 minutes. If wearing any wraps or hose, do not remove them. Continue exercising as instructed. Elevate legs to the level of the heart or above for 30 minutes daily and/or when sitting, a frequency of: - throughout the day Avoid standing for Tregre periods of time. Patient to wear own compression stockings every day. - on Left Leg WOUND #32: - Lower Leg Wound Laterality: Right, Medial Cleanser: Soap and Water 2 x Per Week/15 Days Discharge Instructions: May shower and wash wound with dial antibacterial soap and water prior to dressing change. Cleanser: Wound  Cleanser (Generic) 2 x Per Week/15 Days Discharge Instructions: Cleanse the wound with wound cleanser prior to applying a clean dressing using gauze sponges, not tissue or cotton balls. Peri-Wound Care: Triamcinolone 15 (g) 2 x Per Week/15 Days Discharge Instructions: Use triamcinolone 15 (g) as directed Peri-Wound Care: Sween Lotion (Moisturizing lotion) 2 x Per Week/15 Days Discharge Instructions: Apply moisturizing lotion as directed Topical: Keystone antibiotic gel 2 x Per Week/15 Days Discharge Instructions: Apply directly to wound bed, under primary dressing Prim Dressing: Hydrofera Blue Classic Foam, 4x4 in (Generic) 2 x Per Week/15 Days ary Discharge Instructions: Moisten with saline prior to applying to wound bed Secondary Dressing: ABD Pad, 5x9 (Generic) 2 x Per Week/15 Days Discharge Instructions: Apply over primary dressing as  directed. Secondary Dressing: Zetuvit Plus 4x8 in (Generic) 2 x Per Week/15 Days Discharge Instructions: Apply over primary dressing as directed. Secondary Dressing: CarboFLEX Odor Control Dressing, 4x4 in (Generic) 2 x Per Week/15 Days Discharge Instructions: Apply over primary dressing as directed. Com pression Wrap: CoFlex TLC XL 2-layer Compression System 4x7 (in/yd) 2 x Per Week/15 Days Discharge Instructions: Apply CoFlex 2-layer compression as directed. (alt for 4 layer) 1. Hydrofera Blue under compression wrap 2. Follow-up in 2 weeks Electronic Signature(s) Signed: 12/19/2020 4:28:54 PM By: Geralyn Corwin DO Entered By: Geralyn Corwin on 12/19/2020 16:28:16 -------------------------------------------------------------------------------- HxROS Details Patient Name: Date of Service: [name], [name] 12/19/2020 2:45 PM Medical Record Number: [id number] Patient Account Number: [id number] Date of Birth/Sex: Treating RN: 1980/12/14 (40 y.o. [name] Primary Care Provider: Georganna Skeans Other Clinician: Referring Provider: Treating  Provider/Extender: Otilio Connors in Treatment: 13 Information Obtained From Patient Constitutional Symptoms (General Health) Medical History: Past Medical History Notes: morbid obesity Eyes Medical History: Negative for: Cataracts; Glaucoma; Optic Neuritis Ear/Nose/Mouth/Throat Medical History: Negative for: Chronic sinus problems/congestion; Middle ear problems Hematologic/Lymphatic Medical History: Positive for: Lymphedema - right leg Negative for: Anemia; Hemophilia; Human Immunodeficiency Virus; Sickle Cell Disease Respiratory Medical History: Negative for: Aspiration; Asthma; Chronic Obstructive Pulmonary Disease (COPD); Pneumothorax; Sleep Apnea; Tuberculosis Cardiovascular Medical History: Positive for: Deep Vein Thrombosis - hx right leg; Hypertension; Peripheral Venous Disease - venous insufficiency and varicosities Negative for: Angina; Arrhythmia; Congestive Heart Failure; Coronary Artery Disease; Hypotension; Myocardial Infarction; Peripheral Arterial Disease; Phlebitis; Vasculitis Gastrointestinal Medical History: Negative for: Cirrhosis ; Colitis; Crohns; Hepatitis A; Hepatitis B; Hepatitis C Endocrine Medical History: Positive for: Type II Diabetes Negative for: Type I Diabetes Time with diabetes: 3 years Treated with: Oral agents, Diet Blood sugar tested every day: No Genitourinary Medical History: Negative for: End Stage Renal Disease Immunological Medical History: Negative for: Lupus Erythematosus; Raynauds; Scleroderma Integumentary (Skin) Medical History: Negative for: History of Burn Musculoskeletal Medical History: Negative for: Gout; Rheumatoid Arthritis; Osteoarthritis; Osteomyelitis Neurologic Medical History: Negative for: Dementia; Neuropathy; Quadriplegia; Paraplegia; Seizure Disorder Oncologic Medical History: Negative for: Received Chemotherapy; Received Radiation Psychiatric Medical History: Negative  for: Anorexia/bulimia; Confinement Anxiety Immunizations Pneumococcal Vaccine: Received Pneumococcal Vaccination: No Implantable Devices None Hospitalization / Surgery History Type of Hospitalization/Surgery tonsillectomy right knee ligament repair Family and Social History Cancer: No; Diabetes: Yes - Mother; Heart Disease: No; Hereditary Spherocytosis: No; Hypertension: No; Kidney Disease: No; Lung Disease: No; Seizures: No; Stroke: No; Thyroid Problems: No; Tuberculosis: No; Unknown if ever smoked; Marital Status - Single; Alcohol Use: Rarely; Drug Use: No History; Financial Concerns: No; Food, Clothing or Shelter Needs: No; Support System Lacking: No; Transportation Concerns: No Electronic Signature(s) Signed: 12/19/2020 4:28:54 PM By: Geralyn Corwin DO Signed: 12/19/2020 5:07:29 PM By: Shawn Stall RN, BSN Entered By: Geralyn Corwin on 12/19/2020 16:25:22 -------------------------------------------------------------------------------- SuperBill Details Patient Name: Date of Service: [name], A DRIA Dorris Carnes T. 12/19/2020 Medical Record Number: [id number] Patient Account Number: [id number] Date of Birth/Sex: Treating RN: [birth year] (40 y.o. [name], [name] Primary Care Provider: Georganna Skeans Other Clinician: Referring Provider: Treating Provider/Extender: Otilio Connors in Treatment: 13 Diagnosis Coding ICD-10 Codes Code Description [phone] Chronic venous hypertension (idiopathic) with ulcer and inflammation of right lower extremity I89.0 Lymphedema, not elsewhere classified L97.818 Non-pressure chronic ulcer of other part of right lower leg with other specified severity Facility Procedures CPT4 Code: [id number] Description: (Facility Use Only) [id number] - APPLY MULTLAY COMPRS LWR RT LEG Modifier: Quantity: 1 Physician  Procedures : CPT4 Code Description Modifier [id number] - WC PHYS LEVEL 3 - EST PT ICD-10 Diagnosis Description I87.331  Chronic venous hypertension (idiopathic) with ulcer and inflammation of right lower extremity I89.0 Lymphedema, not elsewhere classified  L97.818 Non-pressure chronic ulcer of other part of right lower leg with other specified severity Quantity: 1 Electronic Signature(s) Signed: 12/19/2020 4:28:54 PM By: Geralyn Corwin DO Entered By: Geralyn Corwin on 12/19/2020 16:28:31

## 2021-01-02 ENCOUNTER — Encounter (HOSPITAL_BASED_OUTPATIENT_CLINIC_OR_DEPARTMENT_OTHER): Payer: 59 | Admitting: Internal Medicine

## 2021-01-02 ENCOUNTER — Other Ambulatory Visit: Payer: Self-pay

## 2021-01-02 DIAGNOSIS — I890 Lymphedema, not elsewhere classified: Secondary | ICD-10-CM | POA: Diagnosis not present

## 2021-01-02 DIAGNOSIS — I87331 Chronic venous hypertension (idiopathic) with ulcer and inflammation of right lower extremity: Secondary | ICD-10-CM | POA: Diagnosis not present

## 2021-01-02 DIAGNOSIS — L97818 Non-pressure chronic ulcer of other part of right lower leg with other specified severity: Secondary | ICD-10-CM

## 2021-01-02 NOTE — Progress Notes (Signed)
[name], [name] ([id number]) . Visit Report for 01/02/2021 Chief Complaint Document Details Patient Name: Date of Service: [name] 01/02/2021 2:30 PM Medical Record Number: [id number] Patient Account Number: [id number] Date of Birth/Sex: Treating RN: [name], [name] (40 y.o. [name] Primary Care Provider: Georganna Skeans Other Clinician: Referring Provider: Treating Provider/Extender: Mackie Pai in Treatment: 15 Information Obtained from: Patient Chief Complaint pt has blockage or absence of r iliac vein 05/22/2018; patient is here for review of wound on his right lower extremity 7/[name]/2022; patient is again here for wound review of wounds on his right lower leg Electronic Signature(s) Signed: 01/02/2021 4:39:56 PM By: Geralyn Corwin DO Entered By: Geralyn Corwin on 01/02/2021 16:36:03 -------------------------------------------------------------------------------- Debridement Details Patient Name: Date of Service: [name], [name]. 01/02/2021 2:30 PM Medical Record Number: [id number] Patient Account Number: [id number] Date of Birth/Sex: Treating RN: [birth year][name] (40 y.o. [name] Primary Care Provider: Georganna Skeans Other Clinician: Referring Provider: Treating Provider/Extender: Mackie Pai in Treatment: 15 Debridement Performed for Assessment: Wound #32 Right,Medial Lower Leg Performed By: Physician Geralyn Corwin, DO Debridement Type: Debridement Level of Consciousness (Pre-procedure): Awake and Alert Pre-procedure Verification/Time Out Yes - 16:25 Taken: Start Time: 16:26 Pain Control: Other : benzocaine 20% [name] Area Debrided (L x W): otal 1 (cm) x 1.4 (cm) = 1.4 (cm) Tissue and other material debrided: Viable, Non-Viable, Slough, Subcutaneous, Skin: Dermis , Skin: Epidermis, Fibrin/Exudate, Slough Level: Skin/Subcutaneous Tissue Debridement Description: Excisional Instrument:  Curette Bleeding: Moderate Hemostasis Achieved: Pressure End Time: 16:32 Procedural Pain: 0 Post Procedural Pain: 0 Response to Treatment: Procedure was tolerated well Level of Consciousness (Post- Awake and Alert procedure): Post Debridement Measurements of Total Wound Length: (cm) 1 Width: (cm) 1.4 Depth: (cm) 0.1 Volume: (cm) 0.11 Character of Wound/Ulcer Post Debridement: Requires Further Debridement Post Procedure Diagnosis Same as Pre-procedure Electronic Signature(s) Signed: 01/02/2021 4:39:56 PM By: Geralyn Corwin DO Signed: 01/02/2021 5:29:18 PM By: Shawn Stall RN, BSN Entered By: Shawn Stall on 01/02/2021 16:32:43 -------------------------------------------------------------------------------- HPI Details Patient Name: Date of Service: [name], [name]. 01/02/2021 2:30 PM Medical Record Number: [id number] Patient Account Number: [id number] Date of Birth/Sex: Treating RN: [birth year]/[name] (40 y.o. [name] Primary Care Provider: Georganna Skeans Other Clinician: Referring Provider: Treating Provider/Extender: Mackie Pai in Treatment: 15 History of Present Illness HPI Description: The patient is Shawn 40 yrs old bm here for evaluation of his right leg ulcer. He has an extensive history of lymphedema and ulcers. He is being treated at the [facility] by Dr. [name] with [name] boots and the [facility]. He has pumps at home but he is only using them once Shawn day. 08/30/14 selective debridement done of surface eschar. The wound cleans up quite nicely in the bed of this looks healthy. He is using Shawn wound VAC under an Unna wrap. 11/17/14; selective debridement done of surface eschar. Once again the wound beds at clean up quite nicely. He is no longer using Shawn wound VAC. Recent dressing changes include Hydrofera Blue. Apparently he has done Shawn wide variety of different topical dressings including advanced treatment options like Apligraf's without success. 03/21/15;  surgical debridement done of surface eschar and nonviable subcutaneous tissue. The area on the medial aspect of the right leg has closed and the wound on the lateral right leg looks improved has been using Silver Collagen 04/11/15; I think attendance here is sporadic. The area on the right medial leg remains healed.  He has Shawn new tiny area on the back of the right leg. Again Shawn surgical debridement of the surface eschar and nonviable subcutaneous tissue of the major wound on the right lateral leg. He has been using Silver Collagen and at home, he does his own Unna wraps at home edema control seems reasonable. 04/20/15; the area on the right medial leg has Shawn very superficial area which may not even be open nevertheless I felt needed to be dressed today [this is his original chronic wound] the new wound is on the right anterior lateral leg is underwent Shawn surgical debridement. He has Shawn small wound on the right posterior leg. He puts on his own Unna boots, according to our nurses he does this fairly well. We have been using Silver collagen. 05/02/2015 -- I understand in the past his venous studies have been done at [facility] and he was advised weight loss before they would attempt to tackle his iliac vein blockage. He has not gone back for review. 06/27/2015 -- we have applied for Apligraf and are awaiting his insurance clearance. 07/25/2015 -- he still has to use her back from his insurance agent regarding his copayment for his Apligraf. 07/31/2015 -- the patient got Shawn reply from the insurance agent regarding the dollar payment for his application but is not sure whether it is for 5 or for one. 11/28/2015 -- has been hurting Shawn little bit more and he has had change in color of his wound especially on the medial part. 12/05/2015 -- his culture was positive for Proteus mirabilis and K Oxytoca which are sensitive to ciprofloxacin which he is already on. 10/3/17still on ciprofloxacin. His mother reports of  dressing had to be changed last Friday due to odor. He has not been systemically unwell 12/19/15; patient came in today complaining of increasing pain and tightness in his upper right thigh. He completed the ciprofloxacin 2 or 3 days ago. He is not running Shawn fever today. 12/26/2015 -- last week he had been seen by Dr. [name] who got Shawn lower extremity venous duplex evaluation done which did not show DVT or SVT in the right lower extremity. he had also put him on Augmentin in addition to the previous ciprofloxacin and he had received for 2 weeks 01/02/2016 -- -- was admitted to the hospital on 12/26/2015 and discharged on 12/29/2015 and was treated for cellulitis. He was also newly diagnosed with type 2 diabetes mellitus and outpatient monitoring and initiation of treatment was recommended. Patients hemoglobin A1c was 6.6 No osteomyelitis detected on x-rays and recent Doppler study was negative for DVT Oral doxycycline and Levaquin were recommended for the patient as an . outpatient for Shawn 14 day treatment. IV Zosyn and vancomycin was given due to concerns of Pseudomonas infection while he was in hospital. [name]/07/2015 -- he has not gone back to [facility] where he had his vascular opinion earlier and I have urged him to regroup with them to see if there is any surgical options available. [name]/19/2017 -- has an appointment to see [facility] vascular surgeons on January 3 and may be seeing Dr. Verdie Drown 03/19/2016 -- I was not able to find any notes on Epic but the patient did go to the vascular clinic at [facility] and saw the PA to Dr. Verdie Drown. I understand she has recommended Shawn CT scan and MRI to be done on February 1 and they would review this with him on the same day. 04/09/2016 --  was admitted to the hospital on 03/20/2016 acute respiratory failure with hypoxia, abdominal discomfort with erythema, hypertensive urgency and chronic wound of the right leg with morbid obesity. he was  discharged home on 04/05/2016 and was to continue on IV antibiotics for 18 more days follow-up with the wound center and continue with his cardiologist. he has lost approximately 130 pounds and diuresed over 48 L. He was treated with IV Zosyn and ID recommended he continue this for 3 weeks more. The notes from [facility] wound clinic were noted where he was seen by the PA and she had taken cultures which grew MSSA and Pseudomonas. She had recommended edema control with Shawn 4-layer compression and also referred him to the lymphedema clinic. Shawn CT venogram was also planned for the future. 04/16/2016 -- at [facility] Shawn CT pelvic venogram runoff was done on 04/11/2016 -- IMPRESSION:-Limited study secondary to poor opacification of venous structures. No definite evidence of acute deep vein thrombosis. 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 -Extensive right iliac chain and right inguinal adenopathy, favored to be reactive/secondary to congestion. The patient continues on his IV antibiotics through his PICC line and has his cardiology opinion and also Shawn bariatric surgery opinion coming up. 04/30/2016 -- He has completed his IV antibiotics through his PICC line. 05/14/2016 --he was seen by the PA, Ms Sluss, recommended alternate day dressing with compression and use Hibiclens and Dakin's solution with acetic acid on his wounds. 07/30/2016 -- he has still not got his custom-made compression stockings and I have urged him to go and get him self measured for these. 08/06/2016 -- he has been measured for his custom stocking and will get in 2 weeks. He has lost 20 pounds since March 08/27/2016 -- he has not got his custom stockings yet and he tried another pair which has not fit well and he has had Shawn lot of maceration increase the size of his wound. 09/03/2016 -- the patient says that he has not been very compliant with his  dressing changes and his elevation and exercise and this week he is notices wound get rather large with maceration. 09/18/16; according to our intake nurse the measurements on this patient's wound are slightly larger. I note previous compliance concerns. I note that his wounds measured larger last week which was noted by Dr. Meyer Russel. Culture done last week was negative. 10/01/2016 -- the patient has received some lymphedema pumps which are new and he says he is being compliant with this. He is going to be away for 2 weeks on vacation to [facility] 10/15/2016 -- he returns after 2 weeks and tells me he has been diligent with his dressing and has lost 25 pounds over the last 3 months. 10/22/2016 -- his last hemoglobin A1c was 6.2 and he is now diet controlled. 11/19/2016 -- he has been having problems with his compression stockings which are custom made at [facility]. He has not yet been able to get them. He had taken out his compression because he had gone there and had no dressing on when he came here today READMISSION 05/22/2018 Mr. Russ is Shawn now 40 year old man with Shawn Crites history of wounds in his lower extremities secondary to chronic venous insufficiency with secondary lymphedema. He has had previous vein ligations on the right although I do not have this information in front of me. As I understand things he also has central venous obstruction with  Shawn vein bypass in 2005 I believe this was done at [facility]. He tells me over the last 2 months he has noted mid reopening in the right mid anterior lower extremity. This is Shawn rectangular shaped wound which is kind of odd in terms of how that formed. He has been using silver alginate and Unna boots that we used on him when he was here in 2018. He buys his product supply online thinks this is cheaper than ordering through intermediary's Past medical history; CHF, morbid obesity, hypertension, hyperlipidemia, chronic venous insufficiency, vein bypass in 2005  previous vein ablations or ligations. He has Shawn stent in the right lower extremity. ABI in this clinic was 1.04. He is using his compression pumps at home 3/27; 2-week follow-up. Right lower extremity wounds related to chronic venous insufficiency and lymphedema. He has been using silver alginate under an Radio broadcast assistant. He change the [employer] himself at home last week. He is making nice progress 4/10; 2-week follow-up. Right lower extremity wound is just about closed Shawn small open area in the middle of the and large rectangular wound he had at the beginning. His edema control is excellent. He says he is using his compression pumps religiously 4/17; 2-week follow-up. Right lower extremity wound is totally closed. He had Shawn large rectangular wound which is progressively closed. His edema control is very good. He is using his compression pumps once to twice Shawn day READMISSION 7/[name]/2022 Mr. Tebbetts is now Shawn 40 year old man we have had for several stays in this clinic including 2017, 2018 and most recently in 2020 with Shawn wound on his right lower leg. He has compression pumps which he claims to be using twice Shawn day. He also has stockings however I am not sure how much he uses them. Things have broken down over the last month or 2 he has an extensive wound area on the right mid tibial area I think this is similar to what he has had in the past. He has been using Xeroform and an Ace wrap that his girlfriend is applying. Past medical history; the patient has known chronic venous insufficiency with secondary lymphedema. He had an iliac vein blockage and he had Shawn bypass at [facility] although the patient is not followed up with them. The surgeon may have been Dr. Jacolyn Reedy. As noted he has lymphedema pumps. Type 2 diabetes congestive heart failure obstructive sleep apnea. He has Shawn history of Shawn right leg DVT although this may have been the iliac vein he is not currently on anticoagulation ABI in our clinic is 1.37 on the  right Imaging Results - in this encounter CT Pelvic Venogram Run Off Imaging Results - CT Pelvic Venogram Run Off Impressions Performed At -Limited study secondary to poor opacification of venous structures. Nodefinite evidence of acute deep vein thrombosis. 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 -Extensive right iliac chain and right inguinal adenopathy, favored to bereactive/secondary to congestion. [facility] Imaging Results - CT Pelvic Venogram Run Off Narrative Performed At EXAM: CT PELVIC VENOGRAM RUN OFF DATE: 04/11/2016 1:28 PM ACCESSION: [id number] UN DICTATED: 04/11/2016 2:18 PM INTERPRETATION LOCATION: [facility] CLINICAL INDICATION: 40 years old Male with h/o RLE DVT and massive lymphedema of both LE's. Eval for venous outflow obstruction vs extrinsic mass- L97.203-Skin ulcer of calf with necrosis of muscle, unspecified laterality(RAF-HCC) COMPARISON: CT abdomen pelvis dated [name]/05/2006 TECHNIQUE: Shawn spiral CT scan was obtained approximately 3 minutes after  administration of IV contrast from the kidneys to the knees.Images were reconstructed in the axial plane. Multiplanar reformatted and MIP images were provided for further evaluation of the vessels. For selected cases, 3D volumerendered images are also provided. VASCULAR FINDINGS: There is overall poor contrast opacification of the venous structures, limitingevaluation. IVC: No evidence of thrombus. Iliac veins: The right iliac veins are chronically occluded/diminutive in size. There are extensive venous collaterals noted throughout the right pelvis extending into the right lower extremity. The number and caliber of the venous collaterals have increased when compared to [name]/05/2006. The left iliac veins appear patent. There are also small caliber venous collaterals within the left pelvis extending into the left lower extremity.  Anterior abdominal wall venouscollaterals are also noted but fully imaged secondary to patient diameter. The bilateral femoral and popliteal veins appear patent. There is no evidence of acute thrombus. The arterial vasculature is unremarkable on this venous phase time study. NONVASCULAR FINDINGS: LOWER CHEST Unremarkable. : ABDOMEN: HEPATOBILIARY: Unremarkable liver. No biliary ductal dilatation. Gallbladder isremarkable. PANCREAS: Unremarkable. SPLEEN: Unremarkable. ADRENAL GLANDS: Unremarkable. KIDNEYS/URETERS: Unremarkable. BLADDER: Unremarkable. BOWEL/PERITONEUM/RETROPERITONEUM: No bowel obstruction. No acute inflammatoryprocess. No ascites. LYMPH NODES: Extensive right iliac chain and right inguinal adenopathy. Shawn nodal conglomerate in the right inguinal region measures 6.8 x 3.4 cm (2:124). This is only slightly larger when compared to [name]/05/2006. Given interval stabilityover 10 years, these are favored to be reactive/secondary to congestion. REPRODUCTIVE: Unremarkable. BONES/SOFT TISSUES: No acute osseous abnormalities. Postsurgical changes in the right inguinal and left medial thigh region. Right greater than left softtissue edema throughout the lower extremities. [facility] Imaging Results - CT Pelvic Venogram Run Off Procedure Note Interface, Rad Results In - 04/11/2016 3:28 PM EST EXAM: CT PELVIC VENOGRAM RUN OFF DATE: 04/11/2016 1:28 PM ACCESSION: [id number] UN DICTATED: 04/11/2016 2:18 PM INTERPRETATION LOCATION: [facility] CLINICAL INDICATION: 40 years old Male with h/o RLE DVT and massive lymphedema of both LE's. Eval for venous outflow obstruction vs extrinsic mass- L97.203-Skin ulcer of calf with necrosis of muscle, unspecified laterality (RAF-HCC) COMPARISON: CT abdomen pelvis dated [name]/05/2006 TECHNIQUE: Shawn spiral CT scan was obtained approximately 3 minutes after administration of IV contrast from the kidneys to the knees. Images were reconstructed in the axial plane.  Multiplanar reformatted and MIP images were provided for further evaluation of the vessels. For selected cases, 3D volume rendered images are also provided. VASCULAR FINDINGS: There is overall poor contrast opacification of the venous structures, limiting evaluation. IVC: No evidence of thrombus. Iliac veins: The right iliac veins are chronically occluded/diminutive in size. There are extensive venous collaterals noted throughout the right pelvis extending into the right lower extremity. The number and caliber of the venous collaterals have increased when compared to [name]/05/2006. The left iliac veins appear patent. There are also small caliber venous collaterals within the left pelvis extending into the left lower extremity. Anterior abdominal wall venous collaterals are also noted but fully imaged secondary to patient diameter. The bilateral femoral and popliteal veins appear patent. There is no evidence of acute thrombus. The arterial vasculature is unremarkable on this venous phase time study. NONVASCULAR FINDINGS: LOWER CHEST Unremarkable. : ABDOMEN: HEPATOBILIARY: Unremarkable liver. No biliary ductal dilatation. Gallbladder is remarkable. PANCREAS: Unremarkable. SPLEEN: Unremarkable. ADRENAL GLANDS: Unremarkable. KIDNEYS/URETERS: Unremarkable. BLADDER: Unremarkable. BOWEL/PERITONEUM/RETROPERITONEUM: No bowel obstruction. No acute inflammatory process. No ascites. LYMPH NODES: Extensive right iliac chain and right inguinal adenopathy. Shawn nodal conglomerate in the right inguinal region measures 6.8 x 3.4 cm (2:124). This is only slightly larger when compared to  [name]/05/2006. Given interval stability over 10 years, these are favored to be reactive/secondary to congestion. REPRODUCTIVE: Unremarkable. BONES/SOFT TISSUES: No acute osseous abnormalities. Postsurgical changes in the right inguinal and left medial thigh region. Right greater than left soft tissue edema throughout the lower  extremities. IMPRESSION: -Limited study secondary to poor opacification of venous structures. No definite evidence of acute deep vein thrombosis. 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 -Extensive right iliac chain and right inguinal adenopathy, favored to be reactive/secondary to congestion. Imaging Results - CT Pelvic Venogram Run Off Performing Organization Address City/State/Zipcode Phone Number [facility] [name] [place], [place] [address] Surgical summary as listed below; Assessment: Vaun Hyndman is Shawn 40 y.o. male with [place] history of right lower extremity chronic obstructive deep venous disease with recurrent ulceration, absence of right iliac vein, prior left to right femoral vein bypass. Patient continues with recurrent ulceration, his weight precludes him from further vascular studies at this time. If he is able to lose 50-100 pounds we would be able to do left lower extremity venogram possible intervention with 50% chance of re- opening occlusion per Dr. Jacolyn Reedy. For global health, weight loss, potential improvement in his chronic venous insufficiency would appreciateinput from bariatric surgery group for recommendations. 09/26/20; patient comes in today with not much improvement. He has also some odor. 3 open areas and this linear area in his mid calf require debridement. We have been using silver alginate under compression. I gave him Keflex last week for what I thought was cellulitis in the right medial thigh he is completing this and I think this is better. 7/26; patient comes in today with again necrotic debris over these wounds. According to our intake nurse extreme malodor and drainage. We have been using silver alginate under compression. He is complaining about pain in the wound area. 7/29; Patient presents for follow-up. He reports improvement to the wound. He has finished taking  Keflex. He has no issues or complaints today. 10/17/2020 upon evaluation today patient appears to be doing poorly in regard to his leg ulcer. He has been tolerating the dressing changes without complication. Fortunately there does not appear to be any signs of active infection at this time. No fevers, chills, nausea, vomiting, or diarrhea. 8/23; the patient has 2 areas anteriorly and Shawn larger area medially on the right lower leg. He comes in today with his compression wrap slipping down. We do not have good edema control. We have been using silver alginate. I believe he completed Shawn course of antibioticso Levaquin given to him 2 weeks ago at his last visit. He states his girlfriend who is Shawn nurse changes his dressings. We have not seen this in 2 weeks. He just received Shawn compounded topical antibiotic based on I believe Shawn deep tissue culture that was done. I do not have Shawn result of the deep tissue culture today and he forgot the compounded antibiotic. I will try to clarify this next time I tried to send him back to vascular at [facility] for review of his central venous obstruction although they would not even see him I think because of noncompliance, or at least perceived noncompliance with regards to the recommendations for weight loss, consideration of bariatric assessment for surgery etc. 8/30; although the patient's wounds anteriorly on the right lower leg extending medially were debrided today. Better looking wound surface he has better edema control still under 4-layer compression. He tells  me he is using his compression pump twice Shawn day however they are greater than 4 years old and he might benefit from Shawn new set. He is using his Jodie Echevaria compounded antibiotic Since the last time he was here he saw Dr. Durwin Nora at vein and vascular at our request. He was felt to have adequate arterial flow for healing although there was still the issue about whether he might need an angiogram if things still. He has had  his history of DVT and an ablation on the right. He did not talk about the s previous central venous procedures that have been done at [facility]. That information is available in care everywhere however based on the duplex exam he was not felt to be Shawn candidate for any venous procedures on the right. He has no significant venous reflux on the right and has had previously had Shawn right greater saphenous vein ablated 9/6; the patient has Shawn cluster of small areas over the right anterior mid tibia as well as an area medially. That the air 1 medially is more confluent all of them look like they have Shawn better surface. We have been putting him in 4-layer compression and using his compression pumps twice Shawn day 9/13; both of the patient's wound areas appear somewhat better especially the large area medially. 9/27; 2-week follow-up. His wife who is Shawn Designer, jewellery has been changing his 4-layer compression with Hydrofera Blue and Keystone I think the measurements of the 2 remaining areas 1 on the right anterior tibia and 1 on the right lateral are better. In another setting I might consider advanced treatment product here. He is using his compression pumps 10/11; patient presents for follow-up. He has no issues or complaints today. His girlfriend continues to change his compression wrap with Hydrofera Blue and [place]. He denies signs of infection. 10/25; patient presents for follow-up. He reports Shawn new wound to the right lateral aspect. He states the area was itching and he scratched developing Shawn wound. He denies signs of infection. Electronic Signature(s) Signed: 01/02/2021 4:39:56 PM By: Geralyn Corwin DO Entered By: Geralyn Corwin on 01/02/2021 16:36:46 -------------------------------------------------------------------------------- Physical Exam Details Patient Name: Date of Service: [name], [name]. 01/02/2021 2:30 PM Medical Record Number: [id number] Patient Account Number: [id number] Date of  Birth/Sex: Treating RN: [name] (40 y.o. [name] Primary Care Provider: Georganna Skeans Other Clinician: Referring Provider: Treating Provider/Extender: Mackie Pai in Treatment: 15 Constitutional respirations regular, non-labored and within target range for patient.[name] Psychiatric pleasant and cooperative. Notes Patient has 1 wound to the right medial lower leg with nonviable tissue. Post debridement there was epithelialization and granulation tissue present. No signs of infection. He now has Shawn new wound to the lateral right aspect limited to skin breakdown. Granulation tissue present. No signs of infection. Electronic Signature(s) Signed: 01/02/2021 4:39:56 PM By: Geralyn Corwin DO Entered By: Geralyn Corwin on 01/02/2021 16:37:41 -------------------------------------------------------------------------------- Physician Orders Details Patient Name: Date of Service: [name], [name]. 01/02/2021 2:30 PM Medical Record Number: [id number] Patient Account Number: [id number] Date of Birth/Sex: Treating RN: [birth year] [name] (40 y.o. [name] Primary Care Provider: Georganna Skeans Other Clinician: Referring Provider: Treating Provider/Extender: Mackie Pai in Treatment: 15 Verbal / Phone Orders: No Diagnosis Coding ICD-10 Coding Code Description I87.331 Chronic venous hypertension (idiopathic) with ulcer and inflammation of right lower extremity I89.0 Lymphedema, not elsewhere classified L97.818 Non-pressure chronic ulcer of other part of right lower leg with other  specified severity Follow-up Appointments ppointment in 2 weeks. - Dr. Mikey Bussing Return Shawn Bathing/ Shower/ Hygiene May shower with protection but do not get wound dressing(s) wet. - May use Shawn cast wrap to put over wraps to shower; you can buy these at [facility] or [facility] Edema Control - Lymphedema / SCD / Other Lymphedema Pumps. Use Lymphedema pumps on leg(s)  2-3 times Shawn day for 45-60 minutes. If wearing any wraps or hose, do not remove them. Continue exercising as instructed. Elevate legs to the level of the heart or above for 30 minutes daily and/or when sitting, Shawn frequency of: - throughout the day Avoid standing for [place] periods of time. Patient to wear own compression stockings every day. - on Left Leg Wound Treatment Wound #32 - Lower Leg Wound Laterality: Right, Medial Cleanser: Soap and Water 2 x Per Week/15 Days Discharge Instructions: May shower and wash wound with dial antibacterial soap and water prior to dressing change. Cleanser: Wound Cleanser (Generic) 2 x Per Week/15 Days Discharge Instructions: Cleanse the wound with wound cleanser prior to applying Shawn clean dressing using gauze sponges, not tissue or cotton balls. Peri-Wound Care: Triamcinolone 15 (g) 2 x Per Week/15 Days Discharge Instructions: Use triamcinolone 15 (g) as directed Peri-Wound Care: Sween Lotion (Moisturizing lotion) 2 x Per Week/15 Days Discharge Instructions: Apply moisturizing lotion as directed Topical: Keystone antibiotic gel 2 x Per Week/15 Days Discharge Instructions: Apply directly to wound bed, under primary dressing Prim Dressing: Hydrofera Blue Classic Foam, 4x4 in (Generic) 2 x Per Week/15 Days ary Discharge Instructions: Moisten with saline prior to applying to wound bed Secondary Dressing: ABD Pad, 5x9 (Generic) 2 x Per Week/15 Days Discharge Instructions: Apply over primary dressing as directed. Secondary Dressing: Zetuvit Plus 4x8 in (Generic) 2 x Per Week/15 Days Discharge Instructions: Apply over primary dressing as directed. Secondary Dressing: CarboFLEX Odor Control Dressing, 4x4 in (Generic) 2 x Per Week/15 Days Discharge Instructions: Apply over primary dressing as directed. Compression Wrap: CoFlex TLC XL 2-layer Compression System 4x7 (in/yd) 2 x Per Week/15 Days Discharge Instructions: Apply CoFlex 2-layer compression as directed. (alt  for 4 layer) Wound #35 - Lower Leg Wound Laterality: Right, Anterior Cleanser: Soap and Water 2 x Per Week/15 Days Discharge Instructions: May shower and wash wound with dial antibacterial soap and water prior to dressing change. Cleanser: Wound Cleanser (Generic) 2 x Per Week/15 Days Discharge Instructions: Cleanse the wound with wound cleanser prior to applying Shawn clean dressing using gauze sponges, not tissue or cotton balls. Peri-Wound Care: Triamcinolone 15 (g) 2 x Per Week/15 Days Discharge Instructions: Use triamcinolone 15 (g) as directed Peri-Wound Care: Sween Lotion (Moisturizing lotion) 2 x Per Week/15 Days Discharge Instructions: Apply moisturizing lotion as directed Prim Dressing: Hydrofera Blue Classic Foam, 4x4 in (Generic) 2 x Per Week/15 Days ary Discharge Instructions: Moisten with saline prior to applying to wound bed Secondary Dressing: ABD Pad, 5x9 (Generic) 2 x Per Week/15 Days Discharge Instructions: Apply over primary dressing as directed. Secondary Dressing: Zetuvit Plus 4x8 in (Generic) 2 x Per Week/15 Days Discharge Instructions: Apply over primary dressing as directed. Secondary Dressing: CarboFLEX Odor Control Dressing, 4x4 in (Generic) 2 x Per Week/15 Days Discharge Instructions: Apply over primary dressing as directed. Compression Wrap: CoFlex TLC XL 2-layer Compression System 4x7 (in/yd) 2 x Per Week/15 Days Discharge Instructions: Apply CoFlex 2-layer compression as directed. (alt for 4 layer) Electronic Signature(s) Signed: 01/02/2021 4:39:56 PM By: Geralyn Corwin DO Entered By: Geralyn Corwin on 01/02/2021 16:37:56 -------------------------------------------------------------------------------- Problem  List Details Patient Name: Date of Service: [name] 01/02/2021 2:30 PM Medical Record Number: [id number] Patient Account Number: [id number] Date of Birth/Sex: Treating RN: [name] (40 y.o. [name] Primary Care Provider: Georganna Skeans Other Clinician: Referring Provider: Treating Provider/Extender: Mackie Pai in Treatment: 15 Active Problems ICD-10 Encounter Code Description Active Date MDM Diagnosis I87.331 Chronic venous hypertension (idiopathic) with ulcer and inflammation of right 7/[name]/2022 No Yes lower extremity I89.0 Lymphedema, not elsewhere classified 7/[name]/2022 No Yes L97.818 Non-pressure chronic ulcer of other part of right lower leg with other specified 7/[name]/2022 No Yes severity Inactive Problems Resolved Problems Electronic Signature(s) Signed: 01/02/2021 4:39:56 PM By: Geralyn Corwin DO Entered By: Geralyn Corwin on 01/02/2021 16:35:44 -------------------------------------------------------------------------------- Progress Note Details Patient Name: Date of Service: [name], [name]. 01/02/2021 2:30 PM Medical Record Number: [id number] Patient Account Number: [id number] Date of Birth/Sex: Treating RN: [birth year], [name] (40 y.o. 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 He has an extensive history of lymphedema and ulcers. He is being treated at the [facility] by Dr. [name] with [name] boots and the [facility]. He has pumps at home but he is only using them once Shawn day. 08/30/14 selective debridement done of surface eschar. The wound cleans up quite nicely in the bed of this looks healthy. He is using Shawn wound VAC under an Unna wrap. 11/17/14; selective debridement done of surface eschar.  Once again the wound beds at clean up quite nicely. He is no longer using Shawn wound VAC. Recent dressing changes include Hydrofera Blue. Apparently he has done Shawn wide variety of different topical dressings including advanced treatment options like Apligraf's without success. 03/21/15; surgical debridement done of surface eschar and nonviable subcutaneous tissue. The area on the medial aspect of the right leg has closed and the wound on the lateral right leg looks improved has been using Silver Collagen 04/11/15; I think attendance here is sporadic. The area on the right medial leg remains healed. He has Shawn new tiny area on the back of the right leg. Again Shawn surgical debridement of the surface eschar and nonviable subcutaneous tissue of the major wound on the right lateral leg. He has been using Silver Collagen and at home, he does his own Unna wraps at home edema control seems reasonable. 04/20/15; the area on the right medial leg has Shawn very superficial area which may not even be open nevertheless I felt needed to be dressed today [this is his original chronic wound] the new wound is on the right anterior lateral leg is underwent Shawn surgical debridement. He has Shawn small wound on the right posterior leg. He puts on his own Unna boots, according to our nurses he does this fairly well. We have been using Silver collagen. 05/02/2015 -- I understand in the past his venous studies have been done at [facility] and he was advised weight loss before they would attempt to tackle his iliac vein blockage. He has not gone back for review. 06/27/2015 -- we  have applied for Apligraf and are awaiting his insurance clearance. 07/25/2015 -- he still has to use her back from his insurance agent regarding his copayment for his Apligraf. 07/31/2015 -- the patient got Shawn reply from the insurance agent regarding the dollar payment for his application but is not sure whether it is for 5 or for one. 11/28/2015 -- has been hurting Shawn  little bit more and he has had change in color of his wound especially on the medial part. 12/05/2015 -- his culture was positive for Proteus mirabilis and K Oxytoca which are sensitive to ciprofloxacin which he is already on. 12/12/15oostill on ciprofloxacin. His mother reports of dressing had to be changed last Friday due to odor. He has not been systemically unwell 12/19/15; patient came in today complaining of increasing pain and tightness in his upper right thigh. He completed the ciprofloxacin 2 or 3 days ago. He is not running Shawn fever today. 12/26/2015 -- last week he had been seen by Dr. [name] who got Shawn lower extremity venous duplex evaluation done which did not show DVT or SVT in the right lower extremity. he had also put him on Augmentin in addition to the previous ciprofloxacin and he had received for 2 weeks 01/02/2016 -- -- was admitted to the hospital on 12/26/2015 and discharged on 12/29/2015 and was treated for cellulitis. He was also newly diagnosed with type 2 diabetes mellitus and outpatient monitoring and initiation of treatment was recommended. Patientoos hemoglobin A1c was 6.6 No osteomyelitis detected on x-rays and recent Doppler study was negative for DVT Oral doxycycline and Levaquin were recommended for the patient as an . outpatient for Shawn 14 day treatment. IV Zosyn and vancomycin was given due to concerns of Pseudomonas infection while he was in hospital. [name]/07/2015 -- he has not gone back to [facility] where he had his vascular opinion earlier and I have urged him to regroup with them to see if there is any surgical options available. [name]/19/2017 -- has an appointment to see [facility] vascular surgeons on January 3 and may be seeing Dr. Verdie Drown 03/19/2016 -- I was not able to find any notes on Epic but the patient did go to the vascular clinic at [facility] and saw the PA to Dr. Verdie Drown. I understand she has recommended Shawn CT scan and MRI to be  done on February 1 and they would review this with him on the same day. 04/09/2016 -- was admitted to the hospital on 03/20/2016 acute respiratory failure with hypoxia, abdominal discomfort with erythema, hypertensive urgency and chronic wound of the right leg with morbid obesity. he was discharged home on 04/05/2016 and was to continue on IV antibiotics for 18 more days follow-up with the wound center and continue with his cardiologist. he has lost approximately 130 pounds and diuresed over 48 L. He was treated with IV Zosyn and ID recommended he continue this for 3 weeks more. The notes from [facility] wound clinic were noted where he was seen by the PA and she had taken cultures which grew MSSA and Pseudomonas. She had recommended edema control with Shawn 4-layer compression and also referred him to the lymphedema clinic. Shawn CT venogram was also planned for the future. 04/16/2016 -- at [facility] Shawn CT pelvic venogram runoff was done on 04/11/2016 -- IMPRESSION:-Limited study secondary to poor opacification of venous structures. No definite evidence of acute deep vein thrombosis. 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 -Extensive right iliac chain and right inguinal adenopathy, favored to be reactive/secondary to congestion. The patient continues on his IV antibiotics through his PICC line and has his cardiology opinion and also Shawn bariatric surgery opinion coming up. 04/30/2016 -- He has completed his IV antibiotics through his PICC line. 05/14/2016 --he was seen by the PA, Ms Sluss, recommended alternate day dressing with compression and use Hibiclens and Dakin's solution with acetic acid on his wounds. 07/30/2016 -- he has still not got his custom-made compression stockings and I have urged him to go and get him self measured for these. 08/06/2016 -- he has been measured for his custom stocking and will get in 2  weeks. He has lost 20 pounds since March 08/27/2016 -- he has not got his custom stockings yet and he tried another pair which has not fit well and he has had Shawn lot of maceration increase the size of his wound. 09/03/2016 -- the patient says that he has not been very compliant with his dressing changes and his elevation and exercise and this week he is notices wound get rather large with maceration. 09/18/16; according to our intake nurse the measurements on this patient's wound are slightly larger. I note previous compliance concerns. I note that his wounds measured larger last week which was noted by Dr. Meyer Russel. Culture done last week was negative. 10/01/2016 -- the patient has received some lymphedema pumps which are new and he says he is being compliant with this. He is going to be away for 2 weeks on vacation to [facility] 10/15/2016 -- he returns after 2 weeks and tells me he has been diligent with his dressing and has lost 25 pounds over the last 3 months. 10/22/2016 -- his last hemoglobin A1c was 6.2 and he is now diet controlled. 11/19/2016 -- he has been having problems with his compression stockings which are custom made at [facility]. He has not yet been able to get them. He had taken out his compression because he had gone there and had no dressing on when he came here today READMISSION 05/22/2018 Mr. Serviss is Shawn now 40 year old man with Shawn Cermak history of wounds in his lower extremities secondary to chronic venous insufficiency with secondary lymphedema. He has had previous vein ligations on the right although I do not have this information in front of me. 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 He tells me over the last 2 months he has noted mid reopening in the right mid anterior lower extremity. This is Shawn rectangular shaped wound which is kind of odd in terms of how that formed. He has been using silver  alginate and Unna boots that we used on him when he was here in 2018. He buys his product supply online thinks this is cheaper than ordering through intermediary's Past medical history; CHF, morbid obesity, hypertension, hyperlipidemia, chronic venous insufficiency, vein bypass in 2005 previous vein ablations or ligations. He has Shawn stent in the right lower extremity. ABI in this clinic was 1.04. He is using his compression pumps at home 3/27; 2-week follow-up. Right lower extremity wounds related to chronic venous insufficiency and lymphedema. He has been using silver alginate under an Radio broadcast assistant. He change the [employer] himself at home last week. He is making nice progress 4/10; 2-week follow-up. Right lower extremity wound is just about  closed Shawn small open area in the middle of the and large rectangular wound he had at the beginning. His edema control is excellent. He says he is using his compression pumps religiously 4/17; 2-week follow-up. Right lower extremity wound is totally closed. He had Shawn large rectangular wound which is progressively closed. His edema control is very good. He is using his compression pumps once to twice Shawn day READMISSION 7/[name]/2022 Mr. Fryberger is now Shawn 40 year old man we have had for several stays in this clinic including 2017, 2018 and most recently in 2020 with Shawn wound on his right lower leg. He has compression pumps which he claims to be using twice Shawn day. He also has stockings however I am not sure how much he uses them. Things have broken down over the last month or 2 he has an extensive wound area on the right mid tibial area I think this is similar to what he has had in the past. He has been using Xeroform and an Ace wrap that his girlfriend is applying. Past medical history; the patient has known chronic venous insufficiency with secondary lymphedema. He had an iliac vein blockage and he had Shawn bypass at [facility] although the patient is not followed up with them. The  surgeon may have been Dr. Jacolyn Reedy. As noted he has lymphedema pumps. Type 2 diabetes congestive heart failure obstructive sleep apnea. He has Shawn history of Shawn right leg DVT although this may have been the iliac vein he is not currently on anticoagulation ABI in our clinic is 1.37 on the right Imaging Results - in this encounter CT Pelvic Venogram Run Off Imaging Results - CT Pelvic Venogram Run Off Impressions Performed At -Limited study secondary to poor opacification of venous structures. Nodefinite evidence of acute deep vein thrombosis. 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 -Extensive right iliac chain and right inguinal adenopathy, favored to bereactive/secondary to congestion. [facility] RAD Imaging Results - CT Pelvic Venogram Run Off Narrative Performed At EXAM: CT PELVIC VENOGRAM RUN OFF DATE: 04/11/2016 1:28 PM ACCESSION: [id number] UN DICTATED: 04/11/2016 2:18 PM INTERPRETATION LOCATION: [facility] CLINICAL INDICATION: 40 years old Male with h/o RLE DVT and massive lymphedema of both LE's. Eval for venous outflow obstruction vs extrinsic mass- L97.203-Skin ulcer of calf with necrosis of muscle, unspecified laterality(RAF-HCC) COMPARISON: CT abdomen pelvis dated [name]/05/2006 TECHNIQUE: Shawn spiral CT scan was obtained approximately 3 minutes after administration of IV contrast from the kidneys to the knees. Images were reconstructed in the axial plane. Multiplanar reformatted and MIP images were provided for further evaluation of the vessels. For selected cases, 3D volumerendered images are also provided. VASCULAR FINDINGS: There is overall poor contrast opacification of the venous structures, limitingevaluation. IVC: No evidence of thrombus. Iliac veins: The right iliac veins are chronically occluded/diminutive in size. There are extensive venous collaterals noted throughout the right pelvis  extending into the right lower extremity. The number and caliber of the venous collaterals have increased when compared to [name]/05/2006. The left iliac veins appear patent. There are also small caliber venous collaterals within the left pelvis extending into the left lower extremity. Anterior abdominal wall venouscollaterals are also noted but fully imaged secondary to patient diameter. The bilateral femoral and popliteal veins appear patent. There is no evidence of acute thrombus. The arterial vasculature is unremarkable on this venous phase time study. NONVASCULAR FINDINGS: LOWER CHEST Unremarkable. : ABDOMEN: HEPATOBILIARY: Unremarkable liver. No biliary ductal dilatation.  Gallbladder isremarkable. PANCREAS: Unremarkable. SPLEEN: Unremarkable. ADRENAL GLANDS: Unremarkable. KIDNEYS/URETERS: Unremarkable. BLADDER: Unremarkable. BOWEL/PERITONEUM/RETROPERITONEUM: No bowel obstruction. No acute inflammatoryprocess. No ascites. LYMPH NODES: Extensive right iliac chain and right inguinal adenopathy. Shawn nodal conglomerate in the right inguinal region measures 6.8 x 3.4 cm (2:124). This is only slightly larger when compared to [name]/05/2006. Given interval stabilityover 10 years, these are favored to be reactive/secondary to congestion. REPRODUCTIVE: Unremarkable. BONES/SOFT TISSUES: No acute osseous abnormalities. Postsurgical changes in the right inguinal and left medial thigh region. Right greater than left softtissue edema throughout the lower extremities. [facility] RAD Imaging Results - CT Pelvic Venogram Run Off Procedure Note Interface, Rad Results In - 04/11/2016 3:28 PM EST EXAM: CT PELVIC VENOGRAM RUN OFF DATE: 04/11/2016 1:28 PM ACCESSION: [id number] UN DICTATED: 04/11/2016 2:18 PM INTERPRETATION LOCATION: [facility] CLINICAL INDICATION: 40 years old Male with h/o RLE DVT and massive lymphedema of both LE's. Eval for venous outflow obstruction vs extrinsic mass- L97.203-Skin ulcer of calf with  necrosis of muscle, unspecified laterality (RAF-HCC) COMPARISON: CT abdomen pelvis dated [name]/05/2006 TECHNIQUE: Shawn spiral CT scan was obtained approximately 3 minutes after administration of IV contrast from the kidneys to the knees. Images were reconstructed in the axial plane. Multiplanar reformatted and MIP images were provided for further evaluation of the vessels. For selected cases, 3D volume rendered images are also provided. VASCULAR FINDINGS: There is overall poor contrast opacification of the venous structures, limiting evaluation. IVC: No evidence of thrombus. Iliac veins: The right iliac veins are chronically occluded/diminutive in size. There are extensive venous collaterals noted throughout the right pelvis extending into the right lower extremity. The number and caliber of the venous collaterals have increased when compared to [name]/05/2006. The left iliac veins appear patent. There are also small caliber venous collaterals within the left pelvis extending into the left lower extremity. Anterior abdominal wall venous collaterals are also noted but fully imaged secondary to patient diameter. The bilateral femoral and popliteal veins appear patent. There is no evidence of acute thrombus. The arterial vasculature is unremarkable on this venous phase time study. NONVASCULAR FINDINGS: LOWER CHEST Unremarkable. : ABDOMEN: HEPATOBILIARY: Unremarkable liver. No biliary ductal dilatation. Gallbladder is remarkable. PANCREAS: Unremarkable. SPLEEN: Unremarkable. ADRENAL GLANDS: Unremarkable. KIDNEYS/URETERS: Unremarkable. BLADDER: Unremarkable. BOWEL/PERITONEUM/RETROPERITONEUM: No bowel obstruction. No acute inflammatory process. No ascites. LYMPH NODES: Extensive right iliac chain and right inguinal adenopathy. Shawn nodal conglomerate in the right inguinal region measures 6.8 x 3.4 cm (2:124). This is only slightly larger when compared to [name]/05/2006. Given interval stability over 10 years, these  are favored to be reactive/secondary to congestion. REPRODUCTIVE: Unremarkable. BONES/SOFT TISSUES: No acute osseous abnormalities. Postsurgical changes in the right inguinal and left medial thigh region. Right greater than left soft tissue edema throughout the lower extremities. IMPRESSION: -Limited study secondary to poor opacification of venous structures. No definite evidence of acute deep vein thrombosis. 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 -Extensive right iliac chain and right inguinal adenopathy, favored to be reactive/secondary to congestion. Imaging Results - CT Pelvic Venogram Run Off Performing Organization Address City/State/Zipcode Phone Number [facility] [name] [place], [place] [address] Surgical summary as listed below; Assessment: Aariv Medlock is Shawn 40 y.o. male with [place] history of right lower extremity chronic obstructive deep venous disease with recurrent ulceration, absence of right iliac vein, prior left to right femoral vein bypass. Patient continues with recurrent ulceration, his weight precludes him from further vascular studies at  this time. If he is able to lose 50-100 pounds we would be able to do left lower extremity venogram possible intervention with 50% chance of re- opening occlusion per Dr. Jacolyn Reedy. For global health, weight loss, potential improvement in his chronic venous insufficiency would appreciateinput from bariatric surgery group for recommendations. 09/26/20; patient comes in today with not much improvement. He has also some odor. 3 open areas and this linear area in his mid calf require debridement. We have been using silver alginate under compression. I gave him Keflex last week for what I thought was cellulitis in the right medial thigh he is completing this and I think this is better. 7/26; patient comes in today with again necrotic debris over  these wounds. According to our intake nurse extreme malodor and drainage. We have been using silver alginate under compression. He is complaining about pain in the wound area. 7/29; Patient presents for follow-up. He reports improvement to the wound. He has finished taking Keflex. He has no issues or complaints today. 10/17/2020 upon evaluation today patient appears to be doing poorly in regard to his leg ulcer. He has been tolerating the dressing changes without complication. Fortunately there does not appear to be any signs of active infection at this time. No fevers, chills, nausea, vomiting, or diarrhea. 8/23; the patient has 2 areas anteriorly and Shawn larger area medially on the right lower leg. He comes in today with his compression wrap slipping down. We do not have good edema control. We have been using silver alginate. I believe he completed Shawn course of antibioticso Levaquin given to him 2 weeks ago at his last visit. He states his girlfriend who is Shawn nurse changes his dressings. We have not seen this in 2 weeks. He just received Shawn compounded topical antibiotic based on I believe Shawn deep tissue culture that was done. I do not have Shawn result of the deep tissue culture today and he forgot the compounded antibiotic. I will try to clarify this next time I tried to send him back to vascular at [facility] for review of his central venous obstruction although they would not even see him I think because of noncompliance, or at least perceived noncompliance with regards to the recommendations for weight loss, consideration of bariatric assessment for surgery etc. 8/30; although the patient's wounds anteriorly on the right lower leg extending medially were debrided today. Better looking wound surface he has better edema control still under 4-layer compression. He tells me he is using his compression pump twice Shawn day however they are greater than 61 years old and he might benefit from Shawn new set. He is using his  Jodie Echevaria compounded antibiotic Since the last time he was here he saw Dr. Durwin Nora at vein and vascular at our request. He was felt to have adequate arterial flow for healing although there was still the issue about whether he might need an angiogram if things still. 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 He did not talk about the s previous central venous procedures that have been done at [facility]. That information is available in care everywhere however based on the duplex exam he was not felt to be Shawn candidate for any venous procedures on the right. He has no significant venous reflux on the right and has had previously had Shawn right greater saphenous vein ablated 9/6; the patient has Shawn cluster of small areas over the right anterior mid tibia as well as an  area medially. That the air 1 medially is more confluent all of them look like they have Shawn better surface. We have been putting him in 4-layer compression and using his compression pumps twice Shawn day 9/13; both of the patient's wound areas appear somewhat better especially the large area medially. 9/27; 2-week follow-up. His wife who is Shawn Designer, jewellery has been changing his 4-layer compression with Hydrofera Blue and Keystone I think the measurements of the 2 remaining areas 1 on the right anterior tibia and 1 on the right lateral are better. In another setting I might consider advanced treatment product here. He is using his compression pumps 10/11; patient presents for follow-up. He has no issues or complaints today. His girlfriend continues to change his compression wrap with Hydrofera Blue and [place]. He denies signs of infection. 10/25; patient presents for follow-up. He reports Shawn new wound to the right lateral aspect. He states the area was itching and he scratched developing Shawn wound. He denies signs of infection. Patient History Information obtained from Patient. Family History Diabetes - Mother, No family history of  Cancer, Heart Disease, Hereditary Spherocytosis, Hypertension, Kidney Disease, Lung Disease, Seizures, Stroke, Thyroid Problems, Tuberculosis. Social History Unknown if ever smoked, Marital Status - Single, Alcohol Use - Rarely, Drug Use - No History. Medical History Eyes Denies history of Cataracts, Glaucoma, Optic Neuritis Ear/Nose/Mouth/Throat Denies history of Chronic sinus problems/congestion, Middle ear problems Hematologic/Lymphatic Patient has history of Lymphedema - right leg Denies history of Anemia, Hemophilia, Human Immunodeficiency Virus, Sickle Cell Disease Respiratory Denies history of Aspiration, Asthma, Chronic Obstructive Pulmonary Disease (COPD), Pneumothorax, Sleep Apnea, Tuberculosis Cardiovascular Patient has history of Deep Vein Thrombosis - hx right leg, Hypertension, Peripheral Venous Disease - venous insufficiency and varicosities Denies history of Angina, Arrhythmia, Congestive Heart Failure, Coronary Artery Disease, Hypotension, Myocardial Infarction, Peripheral Arterial Disease, Phlebitis, Vasculitis Gastrointestinal Denies history of Cirrhosis , Colitis, Crohnoos, Hepatitis Shawn, Hepatitis B, Hepatitis C Endocrine Patient has history of Type II Diabetes Denies history of Type I Diabetes Genitourinary Denies history of End Stage Renal Disease Immunological Denies history of Lupus Erythematosus, Raynaudoos, Scleroderma Integumentary (Skin) Denies history of History of Burn Musculoskeletal Denies history of Gout, Rheumatoid Arthritis, Osteoarthritis, Osteomyelitis Neurologic Denies history of Dementia, Neuropathy, Quadriplegia, Paraplegia, Seizure Disorder Oncologic Denies history of Received Chemotherapy, Received Radiation Psychiatric Denies history of Anorexia/bulimia, Confinement Anxiety Hospitalization/Surgery History - tonsillectomy. - right knee ligament repair. Medical Shawn Surgical History Notes nd Constitutional Symptoms (General  Health) morbid obesity Objective Constitutional respirations regular, non-labored and within target range for patient.. Vitals Time Taken: 4:07 PM, Height: 74 in, Weight: 425 lbs, BMI: 54.6, Temperature: 98.8 F, Pulse: 73 bpm, Respiratory Rate: 17 breaths/min, Blood Pressure: 152/96 mmHg. Psychiatric pleasant and cooperative. General Notes: Patient has 1 wound to the right medial lower leg with nonviable tissue. Post debridement there was epithelialization and granulation tissue present. No signs of infection. He now has Shawn new wound to the lateral right aspect limited to skin breakdown. Granulation tissue present. No signs of infection. Integumentary (Hair, Skin) Wound #32 status is Open. Original cause of wound was Gradually Appeared. The date acquired was: 07/11/2020. The wound has been in treatment 15 weeks. The wound is located on the Right,Medial Lower Leg. The wound measures 1cm length x 1.4cm width x 0.1cm depth; 1.1cm^2 area and 0.11cm^3 volume. There is Fat Layer (Subcutaneous Tissue) exposed. There is no tunneling or undermining noted. There is Shawn large amount of serosanguineous drainage noted. The wound margin is distinct with  the outline attached to the wound base. There is no granulation within the wound bed. There is Shawn large (67-100%) amount of necrotic tissue within the wound bed including Adherent Slough. Wound #35 status is Open. Original cause of wound was Gradually Appeared. The date acquired was: 01/02/2021. The wound is located on the Right,Anterior Lower Leg. The wound measures 2.1cm length x 1.5cm width x 0.1cm depth; 2.474cm^2 area and 0.247cm^3 volume. There is Fat Layer (Subcutaneous Tissue) exposed. There is no tunneling or undermining noted. There is Shawn medium amount of serosanguineous drainage noted. The wound margin is distinct with the outline attached to the wound base. There is large (67-100%) red granulation within the wound bed. There is no necrotic tissue within  the wound bed. Assessment Active Problems ICD-10 Chronic venous hypertension (idiopathic) with ulcer and inflammation of right lower extremity Lymphedema, not elsewhere classified Non-pressure chronic ulcer of other part of right lower leg with other specified severity Patient has Shawn new wound to the right lateral aspect. We will add Hydrofera Blue to this. His original wound has improved in size since last clinic visit. I debrided nonviable tissue. I recommended continuing Keystone antibiotic and Hydrofera Blue under 2 layer Coflex. No signs of infection on exam. Follow-up in 2 weeks. Girlfriend changes the wrap when he does not come into clinic. Procedures Wound #32 Pre-procedure diagnosis of Wound #32 is Shawn Lymphedema located on the Right,Medial Lower Leg . There was Shawn Excisional Skin/Subcutaneous Tissue Debridement with Shawn total area of 1.4 sq cm performed by Geralyn Corwin, DO. With the following instrument(s): Curette to remove Viable and Non-Viable tissue/material. Material removed includes Subcutaneous Tissue, Slough, Skin: Dermis, Skin: Epidermis, and Fibrin/Exudate after achieving pain control using Other (benzocaine 20%). Shawn time out was conducted at 16:25, prior to the start of the procedure. Shawn Moderate amount of bleeding was controlled with Pressure. The procedure was tolerated well with Shawn pain level of 0 throughout and Shawn pain level of 0 following the procedure. Post Debridement Measurements: 1cm length x 1.4cm width x 0.1cm depth; 0.11cm^3 volume. Character of Wound/Ulcer Post Debridement requires further debridement. Post procedure Diagnosis Wound #32: Same as Pre-Procedure Pre-procedure diagnosis of Wound #32 is Shawn Lymphedema located on the Right,Medial Lower Leg . There was Shawn Double Layer Compression Therapy Procedure by Shawn Stall, RN. Post procedure Diagnosis Wound #32: Same as Pre-Procedure Wound #35 Pre-procedure diagnosis of Wound #35 is Shawn Diabetic Wound/Ulcer of the  Lower Extremity located on the Right,Anterior Lower Leg . There was Shawn Double Layer Compression Therapy Procedure by Shawn Stall, RN. Post procedure Diagnosis Wound #35: Same as Pre-Procedure Plan Follow-up Appointments: Return Appointment in 2 weeks. - Dr. Mikey Bussing Bathing/ Shower/ Hygiene: May shower with protection but do not get wound dressing(s) wet. - May use Shawn cast wrap to put over wraps to shower; you can buy these at [facility] or [facility] Edema Control - Lymphedema / SCD / Other: Lymphedema Pumps. Use Lymphedema pumps on leg(s) 2-3 times Shawn day for 45-60 minutes. If wearing any wraps or hose, do not remove them. Continue exercising as instructed. Elevate legs to the level of the heart or above for 30 minutes daily and/or when sitting, Shawn frequency of: - throughout the day Avoid standing for H[place]on periods of time. Patient to wear own compression stockings every day. - on Left Leg WOUND #32: - Lower Leg Wound Laterality: Right, Medial Cleanser: Soap and Water 2 x Per Week/15 Days Discharge Instructions: May shower and wash wound with dial antibacterial  soap and water prior to dressing change. Cleanser: Wound Cleanser (Generic) 2 x Per Week/15 Days Discharge Instructions: Cleanse the wound with wound cleanser prior to applying Shawn clean dressing using gauze sponges, not tissue or cotton balls. Peri-Wound Care: Triamcinolone 15 (g) 2 x Per Week/15 Days Discharge Instructions: Use triamcinolone 15 (g) as directed Peri-Wound Care: Sween Lotion (Moisturizing lotion) 2 x Per Week/15 Days Discharge Instructions: Apply moisturizing lotion as directed Topical: Keystone antibiotic gel 2 x Per Week/15 Days Discharge Instructions: Apply directly to wound bed, under primary dressing Prim Dressing: Hydrofera Blue Classic Foam, 4x4 in (Generic) 2 x Per Week/15 Days ary Discharge Instructions: Moisten with saline prior to applying to wound bed Secondary Dressing: ABD Pad, 5x9 (Generic) 2 x Per Week/15  Days Discharge Instructions: Apply over primary dressing as directed. Secondary Dressing: Zetuvit Plus 4x8 in (Generic) 2 x Per Week/15 Days Discharge Instructions: Apply over primary dressing as directed. Secondary Dressing: CarboFLEX Odor Control Dressing, 4x4 in (Generic) 2 x Per Week/15 Days Discharge Instructions: Apply over primary dressing as directed. Com pression Wrap: CoFlex TLC XL 2-layer Compression System 4x7 (in/yd) 2 x Per Week/15 Days Discharge Instructions: Apply CoFlex 2-layer compression as directed. (alt for 4 layer) WOUND #35: - Lower Leg Wound Laterality: Right, Anterior Cleanser: Soap and Water 2 x Per Week/15 Days Discharge Instructions: May shower and wash wound with dial antibacterial soap and water prior to dressing change. Cleanser: Wound Cleanser (Generic) 2 x Per Week/15 Days Discharge Instructions: Cleanse the wound with wound cleanser prior to applying Shawn clean dressing using gauze sponges, not tissue or cotton balls. Peri-Wound Care: Triamcinolone 15 (g) 2 x Per Week/15 Days Discharge Instructions: Use triamcinolone 15 (g) as directed Peri-Wound Care: Sween Lotion (Moisturizing lotion) 2 x Per Week/15 Days Discharge Instructions: Apply moisturizing lotion as directed Prim Dressing: Hydrofera Blue Classic Foam, 4x4 in (Generic) 2 x Per Week/15 Days ary Discharge Instructions: Moisten with saline prior to applying to wound bed Secondary Dressing: ABD Pad, 5x9 (Generic) 2 x Per Week/15 Days Discharge Instructions: Apply over primary dressing as directed. Secondary Dressing: Zetuvit Plus 4x8 in (Generic) 2 x Per Week/15 Days Discharge Instructions: Apply over primary dressing as directed. Secondary Dressing: CarboFLEX Odor Control Dressing, 4x4 in (Generic) 2 x Per Week/15 Days Discharge Instructions: Apply over primary dressing as directed. Com pression Wrap: CoFlex TLC XL 2-layer Compression System 4x7 (in/yd) 2 x Per Week/15 Days Discharge Instructions: Apply  CoFlex 2-layer compression as directed. (alt for 4 layer) 1. Hydrofera Blue, Keystone under 2 layer Coflex 2. Follow-up in 2 weeks Electronic Signature(s) Signed: 01/02/2021 4:39:56 PM By: Geralyn Corwin DO Entered By: Geralyn Corwin on 01/02/2021 16:39:22 -------------------------------------------------------------------------------- HxROS Details Patient Name: Date of Service: [name], [name]. 01/02/2021 2:30 PM Medical Record Number: [id number] Patient Account Number: [id number] Date of Birth/Sex: Treating RN: [birth year] [name] (40 y.o. [name] Primary Care Provider: Georganna Skeans Other Clinician: Referring Provider: Treating Provider/Extender: Mackie Pai in Treatment: 15 Information Obtained From Patient Constitutional Symptoms (General Health) Medical History: Past Medical History Notes: morbid obesity Eyes Medical History: Negative for: Cataracts; Glaucoma; Optic Neuritis Ear/Nose/Mouth/Throat Medical History: Negative for: Chronic sinus problems/congestion; Middle ear problems Hematologic/Lymphatic Medical History: Positive for: Lymphedema - right leg Negative for: Anemia; Hemophilia; Human Immunodeficiency Virus; Sickle Cell Disease Respiratory Medical History: Negative for: Aspiration; Asthma; Chronic Obstructive Pulmonary Disease (COPD); Pneumothorax; Sleep Apnea; Tuberculosis Cardiovascular Medical History: Positive for: Deep Vein Thrombosis - hx right leg; Hypertension; Peripheral Venous Disease -  venous insufficiency and varicosities Negative for: Angina; Arrhythmia; Congestive Heart Failure; Coronary Artery Disease; Hypotension; Myocardial Infarction; Peripheral Arterial Disease; Phlebitis; Vasculitis Gastrointestinal Medical History: Negative for: Cirrhosis ; Colitis; Crohns; Hepatitis Shawn; Hepatitis B; Hepatitis C Endocrine Medical History: Positive for: Type II Diabetes Negative for: Type I Diabetes Time with  diabetes: 3 years Treated with: Oral agents, Diet Blood sugar tested every day: No Genitourinary Medical History: Negative for: End Stage Renal Disease Immunological Medical History: Negative for: Lupus Erythematosus; Raynauds; Scleroderma Integumentary (Skin) Medical History: Negative for: History of Burn Musculoskeletal Medical History: Negative for: Gout; Rheumatoid Arthritis; Osteoarthritis; Osteomyelitis Neurologic Medical History: Negative for: Dementia; Neuropathy; Quadriplegia; Paraplegia; Seizure Disorder Oncologic Medical History: Negative for: Received Chemotherapy; Received Radiation Psychiatric Medical History: Negative for: Anorexia/bulimia; Confinement Anxiety Immunizations Pneumococcal Vaccine: Received Pneumococcal Vaccination: No Implantable Devices None Hospitalization / Surgery History Type of Hospitalization/Surgery tonsillectomy right knee ligament repair Family and Social History Cancer: No; Diabetes: Yes - Mother; Heart Disease: No; Hereditary Spherocytosis: No; Hypertension: No; Kidney Disease: No; Lung Disease: No; Seizures: No; Stroke: No; Thyroid Problems: No; Tuberculosis: No; Unknown if ever smoked; Marital Status - Single; Alcohol Use: Rarely; Drug Use: No History; Financial Concerns: No; Food, Clothing or Shelter Needs: No; Support System Lacking: No; Transportation Concerns: No Electronic Signature(s) Signed: 01/02/2021 4:39:56 PM By: Geralyn Corwin DO Signed: 01/02/2021 5:29:18 PM By: Shawn Stall RN, BSN Entered By: Geralyn Corwin on 01/02/2021 16:36:56 -------------------------------------------------------------------------------- SuperBill Details Patient Name: Date of Service: [name], [name]. 01/02/2021 Medical Record Number: [id number] Patient Account Number: [id number] Date of Birth/Sex: Treating RN: [birth year] (40 y.o. Harlon Flor, Yvonne Kendall Primary Care Provider: Georganna Skeans Other Clinician: Referring  Provider: Treating Provider/Extender: Mackie Pai in Treatment: 15 Diagnosis Coding ICD-10 Codes Code Description [phone] Chronic venous hypertension (idiopathic) with ulcer and inflammation of right lower extremity I89.0 Lymphedema, not elsewhere classified L97.818 Non-pressure chronic ulcer of other part of right lower leg with other specified severity Facility Procedures CPT4 Code: [id number] Description: 11042 - DEB SUBQ TISSUE 20 SQ CM/< ICD-10 Diagnosis Description L97.818 Non-pressure chronic ulcer of other part of right lower leg with other specifi I89.0 Lymphedema, not elsewhere classified I87.331 Chronic venous hypertension  (idiopathic) with ulcer and inflammation of right Modifier: ed severity lower extremity Quantity: 1 Physician Procedures : CPT4 Code Description Modifier [id number] 11042 - WC PHYS SUBQ TISS 20 SQ CM ICD-10 Diagnosis Description L97.818 Non-pressure chronic ulcer of other part of right lower leg with other specified severity I89.0 Lymphedema, not elsewhere classified I87.331  Chronic venous hypertension (idiopathic) with ulcer and inflammation of right lower extremity Quantity: 1 Electronic Signature(s) Signed: 01/02/2021 4:39:56 PM By: Geralyn Corwin DO Entered By: Geralyn Corwin on 01/02/2021 16:39:39

## 2021-01-02 NOTE — Progress Notes (Signed)
Bok, Corinthian T ([id number]) . Visit Report for 01/02/2021 Arrival Information Details Patient Name: Date of Service: [name] 01/02/2021 2:30 PM Medical Record Number: [id number] Patient Account Number: [id number] Date of Birth/Sex: Treating RN: 1980/05/02 (40 y.o. [name], Millard.Loa Primary Care Janae Bonser: Georganna Skeans Other Clinician: Referring Mia Winthrop: Treating Hever Castilleja/Extender: Mackie Pai in Treatment: 15 Visit Information History Since Last Visit Added or deleted any medications: No Patient Arrived: Ambulatory Any new allergies or adverse reactions: No Arrival Time: 16:06 Had Shawn fall or experienced change in No Accompanied By: self activities of daily living that may affect Transfer Assistance: None risk of falls: Patient Identification Verified: Yes Signs or symptoms of abuse/neglect since last visito No Secondary Verification Process Completed: Yes Hospitalized since last visit: No Patient Requires Transmission-Based Precautions: No Implantable device outside of the clinic excluding No Patient Has Alerts: No cellular tissue based products placed in the center since last visit: Has Dressing in Place as Prescribed: Yes Pain Present Now: Yes Electronic Signature(s) Signed: 01/02/2021 5:29:18 PM By: Shawn Stall RN, BSN Entered By: Shawn Stall on 01/02/2021 16:07:26 -------------------------------------------------------------------------------- Compression Therapy Details Patient Name: Date of Service: [name], Shawn Oppenheim T. 01/02/2021 2:30 PM Medical Record Number: [id number] Patient Account Number: [id number] Date of Birth/Sex: Treating RN: Sep 16, 1980 (40 y.o. [name] Primary Care Nydia Ytuarte: Georganna Skeans Other Clinician: Referring Naisha Wisdom: Treating Janelie Goltz/Extender: Mackie Pai in Treatment: 15 Compression Therapy Performed for Wound Assessment: Wound #32 Right,Medial Lower Leg Performed By:  Clinician Shawn Stall, RN Compression Type: Double Layer Post Procedure Diagnosis Same as Pre-procedure Electronic Signature(s) Signed: 01/02/2021 5:29:18 PM By: Shawn Stall RN, BSN Entered By: Shawn Stall on 01/02/2021 16:33:13 -------------------------------------------------------------------------------- Compression Therapy Details Patient Name: Date of Service: [name], Shawn Oppenheim T. 01/02/2021 2:30 PM Medical Record Number: [id number] Patient Account Number: [id number] Date of Birth/Sex: Treating RN: 1980-06-19 (40 y.o. [name] Primary Care Robt Okuda: Georganna Skeans Other Clinician: Referring Hanifa Antonetti: Treating Chele Cornell/Extender: Mackie Pai in Treatment: 15 Compression Therapy Performed for Wound Assessment: Wound #35 Right,Anterior Lower Leg Performed By: Clinician Shawn Stall, RN Compression Type: Double Layer Post Procedure Diagnosis Same as Pre-procedure Electronic Signature(s) Signed: 01/02/2021 5:29:18 PM By: Shawn Stall RN, BSN Entered By: Shawn Stall on 01/02/2021 16:33:13 -------------------------------------------------------------------------------- Encounter Discharge Information Details Patient Name: Date of Service: [name], Shawn Oppenheim T. 01/02/2021 2:30 PM Medical Record Number: [id number] Patient Account Number: [id number] Date of Birth/Sex: Treating RN: 10-18-1980 (40 y.o. [name] Primary Care Shamonique Battiste: Georganna Skeans Other Clinician: Referring Jomari Bartnik: Treating Manasvi Dickard/Extender: Mackie Pai in Treatment: 15 Encounter Discharge Information Items Post Procedure Vitals Discharge Condition: Stable Temperature (F): 98.8 Ambulatory Status: Ambulatory Pulse (bpm): 73 Discharge Destination: Home Respiratory Rate (breaths/min): 18 Transportation: Private Auto Blood Pressure (mmHg): 152/96 Accompanied By: self Schedule Follow-up Appointment: Yes Clinical Summary of  Care: Electronic Signature(s) Signed: 01/02/2021 5:29:18 PM By: Shawn Stall RN, BSN Entered By: Shawn Stall on 01/02/2021 16:35:06 -------------------------------------------------------------------------------- Multi Wound Chart Details Patient Name: Date of Service: [name], Shawn Oppenheim T. 01/02/2021 2:30 PM Medical Record Number: [id number] Patient Account Number: [id number] Date of Birth/Sex: Treating RN: 1981-01-20 (40 y.o. [name] Primary Care Beauford Lando: Georganna Skeans Other Clinician: Referring Holbert Caples: Treating Johann Gascoigne/Extender: Mackie Pai in Treatment: 15 Vital Signs Height(in): 74 Pulse(bpm): 73 Weight(lbs): 425 Blood Pressure(mmHg): 152/96 Body Mass Index(BMI): 55 Temperature(F): 98.8 Respiratory Rate(breaths/min): 17 Photos: [N/Shawn:N/Shawn] Right, Medial Lower Leg Right,  Anterior Lower Leg N/Shawn Wound Location: Gradually Appeared Gradually Appeared N/Shawn Wounding Event: Lymphedema Diabetic Wound/Ulcer of the Lower N/Shawn Primary Etiology: Extremity Lymphedema, Deep Vein Thrombosis, Lymphedema, Deep Vein Thrombosis, N/Shawn Comorbid History: Hypertension, Peripheral Venous Hypertension, Peripheral Venous Disease, Type II Diabetes Disease, Type II Diabetes 07/11/2020 01/02/2021 N/Shawn Date Acquired: 15 0 N/Shawn Weeks of Treatment: Open Open N/Shawn Wound Status: 1x1.4x0.1 2.1x1.5x0.1 N/Shawn Measurements L x W x D (cm) 1.1 2.474 N/Shawn Shawn (cm) : rea 0.11 0.247 N/Shawn Volume (cm) : 99.00% 0.00% N/Shawn % Reduction in Shawn rea: 99.70% 0.00% N/Shawn % Reduction in Volume: Full Thickness Without Exposed Grade 2 N/Shawn Classification: Support Structures Large Medium N/Shawn Exudate Shawn mount: Serosanguineous Serosanguineous N/Shawn Exudate Type: red, brown red, brown N/Shawn Exudate Color: Distinct, outline attached Distinct, outline attached N/Shawn Wound Margin: None Present (0%) Large (67-100%) N/Shawn Granulation Shawn mount: N/Shawn Red N/Shawn Granulation Quality: Large (67-100%) None  Present (0%) N/Shawn Necrotic Shawn mount: Fat Layer (Subcutaneous Tissue): Yes Fat Layer (Subcutaneous Tissue): Yes N/Shawn Exposed Structures: Fascia: No Fascia: No Tendon: No Tendon: No Muscle: No Muscle: No Joint: No Joint: No Bone: No Bone: No Medium (34-66%) None N/Shawn Epithelialization: Debridement - Excisional N/Shawn N/Shawn Debridement: Pre-procedure Verification/Time Out 16:25 N/Shawn N/Shawn Taken: Other N/Shawn N/Shawn Pain Control: Subcutaneous, Slough N/Shawn N/Shawn Tissue Debrided: Skin/Subcutaneous Tissue N/Shawn N/Shawn Level: 1.4 N/Shawn N/Shawn Debridement Shawn (sq cm): rea Curette N/Shawn N/Shawn Instrument: Moderate N/Shawn N/Shawn Bleeding: Pressure N/Shawn N/Shawn Hemostasis Shawn chieved: 0 N/Shawn N/Shawn Procedural Pain: 0 N/Shawn N/Shawn Post Procedural Pain: Procedure was tolerated well N/Shawn N/Shawn Debridement Treatment Response: 1x1.4x0.1 N/Shawn N/Shawn Post Debridement Measurements L x W x D (cm) 0.11 N/Shawn N/Shawn Post Debridement Volume: (cm) Compression Therapy Compression Therapy N/Shawn Procedures Performed: Debridement Treatment Notes Wound #32 (Lower Leg) Wound Laterality: Right, Medial Cleanser Soap and Water Discharge Instruction: May shower and wash wound with dial antibacterial soap and water prior to dressing change. Wound Cleanser Discharge Instruction: Cleanse the wound with wound cleanser prior to applying Shawn clean dressing using gauze sponges, not tissue or cotton balls. Peri-Wound Care Triamcinolone 15 (g) Discharge Instruction: Use triamcinolone 15 (g) as directed Sween Lotion (Moisturizing lotion) Discharge Instruction: Apply moisturizing lotion as directed Topical Keystone antibiotic gel Discharge Instruction: Apply directly to wound bed, under primary dressing Primary Dressing Hydrofera Blue Classic Foam, 4x4 in Discharge Instruction: Moisten with saline prior to applying to wound bed Secondary Dressing ABD Pad, 5x9 Discharge Instruction: Apply over primary dressing as directed. Zetuvit Plus 4x8 in Discharge Instruction:  Apply over primary dressing as directed. CarboFLEX Odor Control Dressing, 4x4 in Discharge Instruction: Apply over primary dressing as directed. Secured With Compression Wrap CoFlex TLC XL 2-layer Compression System 4x7 (in/yd) Discharge Instruction: Apply CoFlex 2-layer compression as directed. (alt for 4 layer) Compression Stockings Add-Ons Wound #35 (Lower Leg) Wound Laterality: Right, Anterior Cleanser Soap and Water Discharge Instruction: May shower and wash wound with dial antibacterial soap and water prior to dressing change. Wound Cleanser Discharge Instruction: Cleanse the wound with wound cleanser prior to applying Shawn clean dressing using gauze sponges, not tissue or cotton balls. Peri-Wound Care Triamcinolone 15 (g) Discharge Instruction: Use triamcinolone 15 (g) as directed Sween Lotion (Moisturizing lotion) Discharge Instruction: Apply moisturizing lotion as directed Topical Primary Dressing Hydrofera Blue Classic Foam, 4x4 in Discharge Instruction: Moisten with saline prior to applying to wound bed Secondary Dressing ABD Pad, 5x9 Discharge Instruction: Apply over primary dressing as directed. Zetuvit Plus 4x8 in Discharge Instruction: Apply over primary dressing as directed.  CarboFLEX Odor Control Dressing, 4x4 in Discharge Instruction: Apply over primary dressing as directed. Secured With Compression Wrap CoFlex TLC XL 2-layer Compression System 4x7 (in/yd) Discharge Instruction: Apply CoFlex 2-layer compression as directed. (alt for 4 layer) Compression Stockings Add-Ons Electronic Signature(s) Signed: 01/02/2021 4:39:56 PM By: Geralyn Corwin DO Signed: 01/02/2021 5:29:18 PM By: Shawn Stall RN, BSN Entered By: Geralyn Corwin on 01/02/2021 16:35:52 -------------------------------------------------------------------------------- Multi-Disciplinary Care Plan Details Patient Name: Date of Service: [name], Shawn Oppenheim T. 01/02/2021 2:30 PM Medical Record  Number: [id number] Patient Account Number: [id number] Date of Birth/Sex: Treating RN: 02-21-1981 (40 y.o. [name] Primary Care Felissa Blouch: Georganna Skeans Other Clinician: Referring Jalynn Betzold: Treating Shanaya Schneck/Extender: Mackie Pai in Treatment: 15 Active Inactive Wound/Skin Impairment Nursing Diagnoses: Impaired tissue integrity Knowledge deficit related to ulceration/compromised skin integrity Goals: Patient/caregiver will verbalize understanding of skin care regimen Date Initiated: 09/19/2020 Target Resolution Date: 02/02/2021 Goal Status: Active Interventions: Assess patient/caregiver ability to obtain necessary supplies Assess patient/caregiver ability to perform ulcer/skin care regimen upon admission and as needed Assess ulceration(s) every visit Notes: Electronic Signature(s) Signed: 01/02/2021 5:29:18 PM By: Shawn Stall RN, BSN Entered By: Shawn Stall on 01/02/2021 16:29:59 -------------------------------------------------------------------------------- Pain Assessment Details Patient Name: Date of Service: [name], Shawn Oppenheim T. 01/02/2021 2:30 PM Medical Record Number: [id number] Patient Account Number: [id number] Date of Birth/Sex: Treating RN: 05-17-1980 (40 y.o. [name] Primary Care Macrina Lehnert: Georganna Skeans Other Clinician: Referring Nilson Tabora: Treating Pace Lamadrid/Extender: Mackie Pai in Treatment: 15 Active Problems Location of Pain Severity and Description of Pain Patient Has Paino Yes Site Locations Rate the pain. Rate the pain. Current Pain Level: 2 Pain Management and Medication Current Pain Management: Electronic Signature(s) Signed: 01/02/2021 5:29:18 PM By: Shawn Stall RN, BSN Entered By: Shawn Stall on 01/02/2021 16:07:54 -------------------------------------------------------------------------------- Patient/Caregiver Education Details Patient Name: Date of Service: [name] 10/25/2022andnbsp2:30 PM Medical Record Number: [id number] Patient Account Number: [id number] Date of Birth/Gender: Treating RN: 1980/05/12 (40 y.o. [name] Primary Care Physician: Georganna Skeans Other Clinician: Referring Physician: Treating Physician/Extender: Mackie Pai in Treatment: 15 Education Assessment Education Provided To: Patient Education Topics Provided Wound/Skin Impairment: Handouts: Skin Care Do's and Dont's Methods: Explain/Verbal Responses: Reinforcements needed Electronic Signature(s) Signed: 01/02/2021 5:29:18 PM By: Shawn Stall RN, BSN Entered By: Shawn Stall on 01/02/2021 16:31:14 -------------------------------------------------------------------------------- Wound Assessment Details Patient Name: Date of Service: [name], Shawn Oppenheim T. 01/02/2021 2:30 PM Medical Record Number: [id number] Patient Account Number: [id number] Date of Birth/Sex: Treating RN: 03-17-1980 (40 y.o. [name], [name] Primary Care Sacheen Arrasmith: Georganna Skeans Other Clinician: Referring Merrill Deanda: Treating Gabrille Kilbride/Extender: Knox Royalty Weeks in Treatment: 15 Wound Status Wound Number: 32 Primary Lymphedema Etiology: Wound Location: Right, Medial Lower Leg Wound Open Wounding Event: Gradually Appeared Status: Date Acquired: 07/11/2020 Comorbid Lymphedema, Deep Vein Thrombosis, Hypertension, Peripheral Weeks Of Treatment: 15 History: Venous Disease, Type II Diabetes Clustered Wound: No Photos Wound Measurements Length: (cm) 1 Width: (cm) 1.4 Depth: (cm) 0.1 Area: (cm) 1.1 Volume: (cm) 0.11 % Reduction in Area: 99% % Reduction in Volume: 99.7% Epithelialization: Medium (34-66%) Tunneling: No Undermining: No Wound Description Classification: Full Thickness Without Exposed Support Structures Wound Margin: Distinct, outline attached Exudate Amount: Large Exudate Type: Serosanguineous Exudate Color: red,  brown Foul Odor After Cleansing: No Slough/Fibrino Yes Wound Bed Granulation Amount: None Present (0%) Exposed Structure Necrotic Amount: Large (67-100%) Fascia Exposed: No Necrotic Quality: Adherent Slough Fat Layer (Subcutaneous Tissue) Exposed: Yes  Tendon Exposed: No Muscle Exposed: No Joint Exposed: No Bone Exposed: No Treatment Notes Wound #32 (Lower Leg) Wound Laterality: Right, Medial Cleanser Soap and Water Discharge Instruction: May shower and wash wound with dial antibacterial soap and water prior to dressing change. Wound Cleanser Discharge Instruction: Cleanse the wound with wound cleanser prior to applying Shawn clean dressing using gauze sponges, not tissue or cotton balls. Peri-Wound Care Triamcinolone 15 (g) Discharge Instruction: Use triamcinolone 15 (g) as directed Sween Lotion (Moisturizing lotion) Discharge Instruction: Apply moisturizing lotion as directed Topical Keystone antibiotic gel Discharge Instruction: Apply directly to wound bed, under primary dressing Primary Dressing Hydrofera Blue Classic Foam, 4x4 in Discharge Instruction: Moisten with saline prior to applying to wound bed Secondary Dressing ABD Pad, 5x9 Discharge Instruction: Apply over primary dressing as directed. Zetuvit Plus 4x8 in Discharge Instruction: Apply over primary dressing as directed. CarboFLEX Odor Control Dressing, 4x4 in Discharge Instruction: Apply over primary dressing as directed. Secured With Compression Wrap CoFlex TLC XL 2-layer Compression System 4x7 (in/yd) Discharge Instruction: Apply CoFlex 2-layer compression as directed. (alt for 4 layer) Compression Stockings Add-Ons Electronic Signature(s) Signed: 01/02/2021 5:29:18 PM By: Shawn Stall RN, BSN Entered By: Shawn Stall on 01/02/2021 16:29:28 -------------------------------------------------------------------------------- Wound Assessment Details Patient Name: Date of Service: [name], Shawn DRIA N T.  01/02/2021 2:30 PM Medical Record Number: [id number] Patient Account Number: [id number] Date of Birth/Sex: Treating RN: [birth year] (40 y.o. [name], [name] Primary Care Jocelynn Gioffre: Georganna Skeans Other Clinician: Referring Jayan Raymundo: Treating Breane Grunwald/Extender: Knox Royalty Weeks in Treatment: 15 Wound Status Wound Number: 35 Primary Diabetic Wound/Ulcer of the Lower Extremity Etiology: Wound Location: Right, Anterior Lower Leg Wound Open Wounding Event: Gradually Appeared Status: Date Acquired: 01/02/2021 Comorbid Lymphedema, Deep Vein Thrombosis, Hypertension, Peripheral Weeks Of Treatment: 0 History: Venous Disease, Type II Diabetes Clustered Wound: No Photos Wound Measurements Length: (cm) 2.1 Width: (cm) 1.5 Depth: (cm) 0.1 Area: (cm) 2.474 Volume: (cm) 0.247 % Reduction in Area: 0% % Reduction in Volume: 0% Epithelialization: None Tunneling: No Undermining: No Wound Description Classification: Grade 2 Wound Margin: Distinct, outline attached Exudate Amount: Medium Exudate Type: Serosanguineous Exudate Color: red, brown Wound Bed Granulation Amount: Large (67-100%) Granulation Quality: Red Necrotic Amount: None Present (0%) Foul Odor After Cleansing: No Slough/Fibrino No Exposed Structure Fascia Exposed: No Fat Layer (Subcutaneous Tissue) Exposed: Yes Tendon Exposed: No Muscle Exposed: No Joint Exposed: No Bone Exposed: No Treatment Notes Wound #35 (Lower Leg) Wound Laterality: Right, Anterior Cleanser Soap and Water Discharge Instruction: May shower and wash wound with dial antibacterial soap and water prior to dressing change. Wound Cleanser Discharge Instruction: Cleanse the wound with wound cleanser prior to applying Shawn clean dressing using gauze sponges, not tissue or cotton balls. Peri-Wound Care Triamcinolone 15 (g) Discharge Instruction: Use triamcinolone 15 (g) as directed Sween Lotion (Moisturizing lotion) Discharge  Instruction: Apply moisturizing lotion as directed Topical Primary Dressing Hydrofera Blue Classic Foam, 4x4 in Discharge Instruction: Moisten with saline prior to applying to wound bed Secondary Dressing ABD Pad, 5x9 Discharge Instruction: Apply over primary dressing as directed. Zetuvit Plus 4x8 in Discharge Instruction: Apply over primary dressing as directed. CarboFLEX Odor Control Dressing, 4x4 in Discharge Instruction: Apply over primary dressing as directed. Secured With Compression Wrap CoFlex TLC XL 2-layer Compression System 4x7 (in/yd) Discharge Instruction: Apply CoFlex 2-layer compression as directed. (alt for 4 layer) Compression Stockings Add-Ons Electronic Signature(s) Signed: 01/02/2021 5:29:18 PM By: Shawn Stall RN, BSN Entered By: Shawn Stall on 01/02/2021 16:29:05 -------------------------------------------------------------------------------- Vitals Details Patient  Name: Date of Service: [name] 01/02/2021 2:30 PM Medical Record Number: [id number] Patient Account Number: [id number] Date of Birth/Sex: Treating RN: 1980/09/01 (40 y.o. [name] Primary Care Filipe Greathouse: Georganna Skeans Other Clinician: Referring Cherylin Waguespack: Treating Ilena Dieckman/Extender: Mackie Pai in Treatment: 15 Vital Signs Time Taken: 16:07 Temperature (F): 98.8 Height (in): 74 Pulse (bpm): 73 Weight (lbs): 425 Respiratory Rate (breaths/min): 17 Body Mass Index (BMI): 54.6 Blood Pressure (mmHg): 152/96 Reference Range: 80 - 120 mg / dl Electronic Signature(s) Signed: 01/02/2021 5:29:18 PM By: Shawn Stall RN, BSN Entered By: Shawn Stall on 01/02/2021 16:07:43

## 2021-01-03 ENCOUNTER — Ambulatory Visit (INDEPENDENT_AMBULATORY_CARE_PROVIDER_SITE_OTHER): Payer: 59 | Admitting: Family Medicine

## 2021-01-03 ENCOUNTER — Encounter: Payer: Self-pay | Admitting: Family Medicine

## 2021-01-03 VITALS — BP 143/84 | HR 76 | Temp 98.5°F | Resp 20 | Wt >= 6400 oz

## 2021-01-03 DIAGNOSIS — G4733 Obstructive sleep apnea (adult) (pediatric): Secondary | ICD-10-CM | POA: Diagnosis not present

## 2021-01-03 DIAGNOSIS — Z6841 Body Mass Index (BMI) 40.0 and over, adult: Secondary | ICD-10-CM

## 2021-01-03 DIAGNOSIS — I10 Essential (primary) hypertension: Secondary | ICD-10-CM

## 2021-01-03 DIAGNOSIS — I890 Lymphedema, not elsewhere classified: Secondary | ICD-10-CM | POA: Diagnosis not present

## 2021-01-03 DIAGNOSIS — Z9989 Dependence on other enabling machines and devices: Secondary | ICD-10-CM

## 2021-01-03 NOTE — Progress Notes (Signed)
Patient is having trouble sleeping and need some help and anxiety not working, may need something different   Patient need CPAP machine parts Leg circulator pump

## 2021-01-03 NOTE — Progress Notes (Signed)
Established Patient Office Visit  Subjective:  Patient ID: [name], male    DOB: Oct 30, 1980  Age: 40 y.o. MRN: [id number]  CC:  Chief Complaint  Patient presents with   Follow-up   Hypertension    HPI [name] presents for follow-up of hypertension.  Patient also reports that he needs new CPAP equipment.  He has not had an updated sleep study nor equipment for greater than 5 years.  Patient also reports that he needs an updated leg pumps for his lymphedema.  Past Medical History:  Diagnosis Date   Chronic venous insufficiency    a. as a premature infant, required venous access for care with subsequent DVT and some sort of procedure -> eventually went on to have vein bypass around 2005.   Hypertension    Lymphedema    Morbid obesity (HCC)    OSA on CPAP     Past Surgical History:  Procedure Laterality Date   balloon stenting Right    right leg   VEIN LIGATION AND STRIPPING      Family History  Problem Relation Age of Onset   Diabetes Mother    Cancer Father        colon   Cancer Maternal Grandmother    Cancer Maternal Grandfather     Social History   Socioeconomic History   Marital status: Single    Spouse name: Not on file   Number of children: Not on file   Years of education: Not on file   Highest education level: Not on file  Occupational History   Not on file  Tobacco Use   Smoking status: Never   Smokeless tobacco: Never  Substance and Sexual Activity   Alcohol use: Yes    Alcohol/week: 4.0 standard drinks    Types: 4 Standard drinks or equivalent per week    Comment: liquor - pt states once a week   Drug use: No   Sexual activity: Not on file  Other Topics Concern   Not on file  Social History Narrative   Not on file   Social Determinants of Health   Financial Resource Strain: Not on file  Food Insecurity: Not on file  Transportation Needs: Not on file  Physical Activity: Not on file  Stress: Not on file  Social Connections:  Not on file  Intimate Partner Violence: Not on file    ROS Review of Systems  Objective:   Today's Vitals: BP (!) 143/84   Pulse 76   Temp 98.5 F (36.9 C) (Oral)   Resp 20   Wt (!) 491 lb (222.7 kg)   SpO2 93%   BMI 63.04 kg/m   Physical Exam Vitals and nursing note reviewed.  Constitutional:      General: He is not in acute distress.    Appearance: He is obese.  Cardiovascular:     Rate and Rhythm: Normal rate and regular rhythm.  Pulmonary:     Effort: Pulmonary effort is normal.     Breath sounds: Normal breath sounds.  Abdominal:     Palpations: Abdomen is soft.     Tenderness: There is no abdominal tenderness.  Musculoskeletal:     Right lower leg: Edema present.     Left lower leg: Edema present.  Neurological:     General: No focal deficit present.     Mental Status: He is alert and oriented to person, place, and time.    Assessment & Plan:  1. Uncontrolled hypertension  Management as per consultant.  2. OSA on CPAP Referral for updated sleep study for evaluation and management and probable updated CPAP machine and equipment.  3. Chronic acquired lymphedema Referral for wound management for evaluation of updated leg pumps.  4. Class 3 severe obesity due to excess calories with serious comorbidity and body mass index (BMI) of 60.0 to 69.9 in adult [facility]) Discussed continued dietary and activity management.  Patient wants to consider referral for possible bariatric treatment in the near future.  Outpatient Encounter Medications as of 01/03/2021  Medication Sig   Accu-Chek Softclix Lancets lancets Use as instructed   carvedilol (COREG) 12.5 MG tablet Take 1 tablet (12.5 mg total) by mouth 2 (two) times daily.   furosemide (LASIX) 80 MG tablet Take 1 tablet (80 mg total) by mouth 2 (two) times daily.   gabapentin (NEURONTIN) 300 MG capsule Take 1 capsule (300 mg total) by mouth at bedtime.   glucose blood (ACCU-CHEK AVIVA PLUS) test strip Use as instructed    levofloxacin (LEVAQUIN) 750 MG tablet Take 750 mg by mouth daily.   lisinopril (ZESTRIL) 40 MG tablet Take 1 tablet (40 mg total) by mouth daily.   meloxicam (MOBIC) 15 MG tablet Take 1 tablet (15 mg total) by mouth daily.   metFORMIN (GLUCOPHAGE) 500 MG tablet Take 1 tablet (500 mg total) by mouth daily with breakfast.   metolazone (ZAROXOLYN) 2.5 MG tablet Take one tablet 30 minutes prior to your morning Furosemide (Lasix) on Saturdays and Tuesday or as directed by cardiology   Misc. Devices (SCD SOFT SLEEVES/KNEE LENGTH) MISC 2 each by Does not apply route as needed.   PARoxetine (PAXIL) 20 MG tablet Take 1 tablet (20 mg total) by mouth daily.   potassium chloride SA (KLOR-CON) 20 MEQ tablet Take 2 tablets (40 mEq total) by mouth 2 (two) times daily.   traMADol (ULTRAM) 50 MG tablet Take 50 mg by mouth every 6 (six) hours as needed for moderate pain.   spironolactone (ALDACTONE) 25 MG tablet Take 1 tablet (25 mg total) by mouth daily.   No facility-administered encounter medications on file as of 01/03/2021.    Follow-up: Return in about 4 weeks (around 01/31/2021) for follow up.   Tommie Raymond, MD

## 2021-01-05 ENCOUNTER — Ambulatory Visit (INDEPENDENT_AMBULATORY_CARE_PROVIDER_SITE_OTHER): Payer: 59 | Admitting: Cardiovascular Disease

## 2021-01-05 ENCOUNTER — Other Ambulatory Visit: Payer: Self-pay

## 2021-01-05 ENCOUNTER — Encounter (HOSPITAL_BASED_OUTPATIENT_CLINIC_OR_DEPARTMENT_OTHER): Payer: Self-pay | Admitting: Cardiovascular Disease

## 2021-01-05 VITALS — BP 148/95 | HR 78 | Ht 74.0 in | Wt >= 6400 oz

## 2021-01-05 DIAGNOSIS — I5042 Chronic combined systolic (congestive) and diastolic (congestive) heart failure: Secondary | ICD-10-CM

## 2021-01-05 DIAGNOSIS — G4733 Obstructive sleep apnea (adult) (pediatric): Secondary | ICD-10-CM

## 2021-01-05 DIAGNOSIS — I10 Essential (primary) hypertension: Secondary | ICD-10-CM | POA: Diagnosis not present

## 2021-01-05 DIAGNOSIS — Z5181 Encounter for therapeutic drug level monitoring: Secondary | ICD-10-CM | POA: Diagnosis not present

## 2021-01-05 DIAGNOSIS — I509 Heart failure, unspecified: Secondary | ICD-10-CM | POA: Diagnosis not present

## 2021-01-05 MED ORDER — CARVEDILOL 25 MG PO TABS: 25.0000 mg | ORAL_TABLET | Freq: Two times a day (BID) | ORAL | 5 refills | Status: DC

## 2021-01-05 MED ORDER — ENTRESTO 97-103 MG PO TABS: 1.0000 | ORAL_TABLET | Freq: Two times a day (BID) | ORAL | 11 refills | Status: DC

## 2021-01-05 NOTE — Patient Instructions (Signed)
Medication Instructions:  STOP LISINOPRIL   START ON Sunday ENTRESTO 97/103 MG TWICE A DAY   INCREASE YOUR CARVEDILOL TO 25 MG TWICE A DAY   USE YOUR METOLAZONE AS NEEDED FOR 5 LBS FLUID WEIGHT GAIN   *If you need a refill on your cardiac medications before your next appointment, please call your pharmacy*  Lab Work: BMET TODAY   BMET 2 WEEKS   If you have labs (blood work) drawn today and your tests are completely normal, you will receive your results only by: MyChart Message (if you have MyChart) OR A paper copy in the mail If you have any lab test that is abnormal or we need to change your treatment, we will call you to review the results.  Testing/Procedures: NONE   Follow-Up: At [facility], you and your health needs are our priority.  As part of our continuing mission to provide you with exceptional heart care, we have created designated Provider Care Teams.  These Care Teams include your primary Cardiologist (physician) and Advanced Practice Providers (APPs -  Physician Assistants and Nurse Practitioners) who all work together to provide you with the care you need, when you need it.  We recommend signing up for the patient portal called "MyChart".  Sign up information is provided on this After Visit Summary.  MyChart is used to connect with patients for Virtual Visits (Telemedicine).  Patients are able to view lab/test results, encounter notes, upcoming appointments, etc.  Non-urgent messages can be sent to your provider as well.   To learn more about what you can do with MyChart, go to [email or web address].    Your next appointment:   2-3  month(s)  The format for your next appointment:   Virtual Visit   Provider:   Chilton Si, MD

## 2021-01-05 NOTE — Progress Notes (Signed)
Cardiology Office Note   Date:  01/05/2021   ID:  [name], DOB [birth year], MRN [id number]  PCP:  Georganna Skeans, MD  Cardiologist:   Chilton Si, MD   No chief complaint on file.    History of Present Illness: [name] is a 40 y.o. male with chronic heart failure type unknown, hypertension, hyperlipidemia, diabetes, morbid obesity, OSA, and venous insufficiency who presents for follow-up. [name] was admitted 1/10 through 1/26 2018 for acute heart failure exacerbation. Echocardiogram was not interpretable due to body habitus. He was diuresed with IV Lasix with a negative output of 47.2 L. His weight decreased from 631 pounds on admission to 488 pounds at discharge. His creatinine remained stable.  He followed up with [name] on 04/12/16 and was doing well. His weight was 498 pounds. His blood pressure was poorly-controlled. It was noted that carvedilol had been left off his medication list at discharge. This was restarted at that appointment. He followed up with [name] and was doing well. He was exercising and working out with a Systems analyst 3 times per week. on 07/2016.  His blood pressure was slightly elevated at that appointment, but he had come straight from the gym, so no changes were made.   At his last appointment he was feeling well and his weight was up in the setting of going on a cruise and not taking his spironolactone. His medication was resumed. He was also referred to wound care for a non-healing leg ulcer. He last saw [name], NP 10/2020. Prior to that he was treated with metolazone and increased lasix for volume overload. He had recently gone on a cruise and felt palpitations while away. He struggled with motion sickness while on his trip. He was reminded of fluid and sodium restrictions and scheduled for follow-up today. Today, he is feeling pretty well, and states his breathing is okay overall. At home his blood pressure has been  ranging from 138-145 systolic. He reports he was taking metolazone daily while on his cruise, and this was causing him to feel ill. He is not taking metolazone since 11/2020. Lately, his urine production has been low. His right LE wound has almost healed completely, and he plans to return to his exercise routines at the gym and pool. He denies any palpitations, or chest pain. No lightheadedness, headaches, syncope, orthopnea, PND, or exertional symptoms.   Past Medical History:  Diagnosis Date   Chronic venous insufficiency    a. as a premature infant, required venous access for care with subsequent DVT and some sort of procedure -> eventually went on to have vein bypass around 2005.   Hypertension    Lymphedema    Morbid obesity (HCC)    OSA on CPAP     Past Surgical History:  Procedure Laterality Date   balloon stenting Right    right leg   VEIN LIGATION AND STRIPPING       Current Outpatient Medications  Medication Sig Dispense Refill   Accu-Chek Softclix Lancets lancets Use as instructed 100 each 12   furosemide (LASIX) 80 MG tablet Take 1 tablet (80 mg total) by mouth 2 (two) times daily. 60 tablet 2   gabapentin (NEURONTIN) 300 MG capsule Take 1 capsule (300 mg total) by mouth at bedtime. 90 capsule 0   glucose blood (ACCU-CHEK AVIVA PLUS) test strip Use as instructed 100 each 12   levofloxacin (LEVAQUIN) 750 MG tablet Take 750 mg by mouth daily.  meloxicam (MOBIC) 15 MG tablet Take 1 tablet (15 mg total) by mouth daily. 30 tablet 0   metFORMIN (GLUCOPHAGE) 500 MG tablet Take 1 tablet (500 mg total) by mouth daily with breakfast. 90 tablet 0   metolazone (ZAROXOLYN) 2.5 MG tablet Take 2.5 mg by mouth as needed. AS NEEDED FOR WEIGHT GAIN OF 5 POUNDS     Misc. Devices (SCD SOFT SLEEVES/KNEE LENGTH) MISC 2 each by Does not apply route as needed. 2 each 0   PARoxetine (PAXIL) 20 MG tablet Take 1 tablet (20 mg total) by mouth daily. 30 tablet 0   potassium chloride SA (KLOR-CON)  20 MEQ tablet Take 2 tablets (40 mEq total) by mouth 2 (two) times daily. 360 tablet 0   sacubitril-valsartan (ENTRESTO) 97-103 MG Take 1 tablet by mouth 2 (two) times daily. 60 tablet 11   traMADol (ULTRAM) 50 MG tablet Take 50 mg by mouth every 6 (six) hours as needed for moderate pain.     carvedilol (COREG) 25 MG tablet Take 1 tablet (25 mg total) by mouth 2 (two) times daily. 60 tablet 5   spironolactone (ALDACTONE) 25 MG tablet Take 1 tablet (25 mg total) by mouth daily. 90 tablet 0   No current facility-administered medications for this visit.    Allergies:   Lidocaine and Peanut-containing drug products    Social History:  The patient  reports that he has never smoked. He has never used smokeless tobacco. He reports current alcohol use of about 4.0 standard drinks per week. He reports that he does not use drugs.   Family History:  The patient's family history includes Cancer in his father, maternal grandfather, and maternal grandmother; Diabetes in his mother.    ROS:   Please see the history of present illness. All other systems are reviewed and negative.    PHYSICAL EXAM: VS:  BP (!) 148/95 (BP Location: Left Arm, Patient Position: Sitting, Cuff Size: Large)   Pulse 78   Ht 6\' 2"  (1.88 m)   Wt (!) 501 lb (227.3 kg)   SpO2 98%   BMI 64.32 kg/m  , BMI Body mass index is 64.32 kg/m. GENERAL:  Well appearing.  HEENT: Pupils equal round and reactive, fundi not visualized, oral mucosa unremarkable NECK:  No jugular venous distention, waveform within normal limits, carotid upstroke brisk and symmetric, no bruits LUNGS:  Clear to auscultation bilaterally HEART:  RRR.  PMI not displaced or sustained,S1 and S2 within normal limits, no S3, no S4, no clicks, no rubs, no murmurs ABD:  Flat, positive bowel sounds normal in frequency in pitch, no bruits, no rebound, no guarding, no midline pulsatile mass, no hepatomegaly, no splenomegaly EXT:  2 plus pulses throughout, trace R LE  edema.  L LE wrapped.  No cyanosis no clubbing.  SKIN:  No rashes no nodules NEURO:  Cranial nerves II through XII grossly intact, motor grossly intact throughout PSYCH:  Cognitively intact, oriented to person place and time   EKG: 01/05/2021: EKG was not ordered today. 05/04/2018: EKG was not ordered. 10/31/2017: Sinus rhythm.  Rate 85 bpm.  Prior anteroseptal infarct.  Low voltage. 11/05/16: Sinus rhythm.  Rate 72 bpm.  Low voltage.  03/13/16: Sinus rhythm.  Rate 80 bpm.  Nonspecific T wave abnormalities.  LE Venous Reflux 11/02/2020: Summary:  Right:  - No evidence of obvious acute deep vein thrombosis seen in the right  lower extremity, from the common femoral through the popliteal veins.  - No evidence of superficial venous  reflux seen in the right greater  saphenous vein, from the mid to distal thigh to mid calf area. Post  ablation / stripping in the thigh area.  - No evidence of superficial venous reflux seen in the right short  saphenous vein, limited visualization.  - Venous reflux is noted in the right common femoral vein.  - No compression and no flow in varicosities at the medial knee suggestive  of a history of superficial thrombophlebitis  - Enlarged lymph node noted in groin   NOTE: This was a technically difficult and limited exam   Echo 03/21/2016: Impressions:  - Uninterpretable study, even after Definity contrast    administration.   Recent Labs: 10/30/2020: ALT 17; BUN 39; Creatinine, Ser 1.44; Hemoglobin 14.7; Magnesium 2.1; Platelets 245; Potassium 3.6; Sodium 136; TSH 2.190    Lipid Panel    Component Value Date/Time   CHOL 174 06/23/2019 1750   TRIG 122 06/23/2019 1750   HDL 41 06/23/2019 1750   CHOLHDL 4.2 06/23/2019 1750   LDLCALC 111 (H) 06/23/2019 1750      Wt Readings from Last 3 Encounters:  01/05/21 (!) 501 lb (227.3 kg)  01/03/21 (!) 491 lb (222.7 kg)  11/02/20 (!) 495 lb (224.5 kg)      ASSESSMENT AND PLAN:  Hypertension Blood  pressure is poorly controlled.  We are going to switch lisinopril to Entresto 97/103 mg twice daily.  He will stop the lisinopril and restart the Entresto in 2 days.  He has not been taking metolazone.  He will keep this only for as needed use.  Continue spironolactone and increase carvedilol to 25 mg.  He will continue tracking his blood pressure and we will rediscuss at follow-up.  Check a basic metabolic panel today and again in 2 weeks.  OSA (obstructive sleep apnea) Continue BiPAP.  Chronic congestive heart failure (HCC) We have been unable to determine whether he has systolic or diastolic heart failure due to body habitus and inability to interpret the echo even with difinity contrast.  Clinically he is doing well and seems much more euvolemic.  Work on blood pressure control as above by increasing carvedilol and switching lisinopril to [place] he has not needed metolazone since September.  We will let him keep it only as needed.  Continue Lasix and spironolactone.   Current medicines are reviewed at length with the patient today.  The patient does not have concerns regarding medicines.  The following changes have been made:  no change  Labs/ tests ordered today include:   Orders Placed This Encounter  Procedures   Basic metabolic panel   Basic metabolic panel     Disposition:    FU with Elzada Pytel C. Duke Salvia, MD, [facility] virtually in 1-2 months.   I,Mathew Stumpf,acting as a Neurosurgeon for Chilton Si, MD.,have documented all relevant documentation on the behalf of Chilton Si, MD,as directed by  Chilton Si, MD while in the presence of Chilton Si, MD.  I, Ananiah Maciolek C. Duke Salvia, MD have reviewed all documentation for this visit.  The documentation of the exam, diagnosis, procedures, and orders on 01/05/2021 are all accurate and complete.   Signed, Elliette Seabolt C. Duke Salvia, MD, [facility]  01/05/2021 3:24 PM    [facility]

## 2021-01-05 NOTE — Assessment & Plan Note (Signed)
Continue BiPAP.  

## 2021-01-05 NOTE — Assessment & Plan Note (Signed)
Blood pressure is poorly controlled.  We are going to switch lisinopril to Entresto 97/103 mg twice daily.  He will stop the lisinopril and restart the Entresto in 2 days.  He has not been taking metolazone.  He will keep this only for as needed use.  Continue spironolactone and increase carvedilol to 25 mg.  He will continue tracking his blood pressure and we will rediscuss at follow-up.  Check a basic metabolic panel today and again in 2 weeks.

## 2021-01-05 NOTE — Assessment & Plan Note (Signed)
We have been unable to determine whether he has systolic or diastolic heart failure due to body habitus and inability to interpret the echo even with difinity contrast.  Clinically he is doing well and seems much more euvolemic.  Work on blood pressure control as above by increasing carvedilol and switching lisinopril to [place] he has not needed metolazone since September.  We will let him keep it only as needed.  Continue Lasix and spironolactone.

## 2021-01-06 LAB — BASIC METABOLIC PANEL
BUN/Creatinine Ratio: 13 (ref 9–20)
BUN: 12 mg/dL (ref 6–24)
CO2: 24 mmol/L (ref 20–29)
Calcium: 9.1 mg/dL (ref 8.7–10.2)
Chloride: 101 mmol/L (ref 96–106)
Creatinine, Ser: 0.95 mg/dL (ref 0.76–1.27)
Glucose: 120 mg/dL — ABNORMAL HIGH (ref 70–99)
Potassium: 4.4 mmol/L (ref 3.5–5.2)
Sodium: 141 mmol/L (ref 134–144)
eGFR: 104 mL/min/{1.73_m2} (ref >59)

## 2021-01-19 ENCOUNTER — Encounter (HOSPITAL_BASED_OUTPATIENT_CLINIC_OR_DEPARTMENT_OTHER): Payer: 59 | Attending: Internal Medicine | Admitting: Internal Medicine

## 2021-01-19 ENCOUNTER — Other Ambulatory Visit: Payer: Self-pay

## 2021-01-19 DIAGNOSIS — L97818 Non-pressure chronic ulcer of other part of right lower leg with other specified severity: Secondary | ICD-10-CM

## 2021-01-19 DIAGNOSIS — L97812 Non-pressure chronic ulcer of other part of right lower leg with fat layer exposed: Secondary | ICD-10-CM | POA: Diagnosis not present

## 2021-01-19 DIAGNOSIS — I890 Lymphedema, not elsewhere classified: Secondary | ICD-10-CM | POA: Diagnosis not present

## 2021-01-19 DIAGNOSIS — E11622 Type 2 diabetes mellitus with other skin ulcer: Secondary | ICD-10-CM | POA: Insufficient documentation

## 2021-01-19 DIAGNOSIS — I87331 Chronic venous hypertension (idiopathic) with ulcer and inflammation of right lower extremity: Secondary | ICD-10-CM | POA: Diagnosis not present

## 2021-01-19 NOTE — Progress Notes (Signed)
Bradstreet, Cherokee T ([id number]) . Visit Report for 01/19/2021 Chief Complaint Document Details Patient Name: Date of Service: [name] 01/19/2021 10:15 Shawn M Medical Record Number: [id number] Patient Account Number: [id number] Date of Birth/Sex: Treating RN: 10-04-1980 (40 y.o. M) Primary Care Provider: Dorna Mai Other Clinician: Referring Provider: Treating Provider/Extender: Ulla Potash in Treatment: 17 Information Obtained from: Patient Chief Complaint pt has blockage or absence of r iliac vein 05/22/2018; patient is here for review of wound on his right lower extremity 09/19/2020; patient is again here for wound review of wounds on his right lower leg Electronic Signature(s) Signed: 01/19/2021 12:52:34 PM By: Kalman Shan DO Entered By: Kalman Shan on 01/19/2021 12:48:14 -------------------------------------------------------------------------------- HPI Details Patient Name: Date of Service: [name], Shawn DRIA N T. 01/19/2021 10:15 Shawn M Medical Record Number: [id number] Patient Account Number: [id number] Date of Birth/Sex: Treating RN: 09/01/1980 (40 y.o. M) Primary Care Provider: Dorna Mai Other Clinician: Referring Provider: Treating Provider/Extender: Ulla Potash in Treatment: 17 History of Present Illness HPI Description: The patient is Shawn 40 yrs old bm here for evaluation of his right leg ulcer. He has an extensive history of lymphedema and ulcers. He is being treated at the [facility] by Dr. Lindon Romp with Louretta Parma boots and the [facility]. He has pumps at home but he is only using them once Shawn day. 08/30/14 selective debridement done of surface eschar. The wound cleans up quite nicely in the bed of this looks healthy. He is using Shawn wound VAC under an Unna wrap. 11/17/14; selective debridement done of surface eschar. Once again the wound beds at clean up quite nicely. He is no longer using Shawn wound VAC. Recent dressing changes  include Hydrofera Blue. Apparently he has done Shawn wide variety of different topical dressings including advanced treatment options like Apligraf's without success. 03/21/15; surgical debridement done of surface eschar and nonviable subcutaneous tissue. The area on the medial aspect of the right leg has closed and the wound on the lateral right leg looks improved has been using Silver Collagen 04/11/15; I think attendance here is sporadic. The area on the right medial leg remains healed. He has Shawn new tiny area on the back of the right leg. Again Shawn surgical debridement of the surface eschar and nonviable subcutaneous tissue of the major wound on the right lateral leg. He has been using Silver Collagen and at home, he does his own Unna wraps at home edema control seems reasonable. 04/20/15; the area on the right medial leg has Shawn very superficial area which may not even be open nevertheless I felt needed to be dressed today [this is his original chronic wound] the new wound is on the right anterior lateral leg is underwent Shawn surgical debridement. He has Shawn small wound on the right posterior leg. He puts on his own Unna boots, according to our nurses he does this fairly well. We have been using Silver collagen. 05/02/2015 -- I understand in the past his venous studies have been done at [facility] and he was advised weight loss before they would attempt to tackle his iliac vein blockage. He has not gone back for review. 06/27/2015 -- we have applied for Apligraf and are awaiting his insurance clearance. 07/25/2015 -- he still has to use her back from his insurance agent regarding his copayment for his Apligraf. 07/31/2015 -- the patient got Shawn reply from the insurance agent regarding the dollar payment for his application but is  not sure whether it is for 5 or for one. 11/28/2015 -- has been hurting Shawn little bit more and he has had change in color of his wound especially on the medial part. 12/05/2015 -- his  culture was positive for Proteus mirabilis and K Oxytoca which are sensitive to ciprofloxacin which he is already on. 10/3/17still on ciprofloxacin. His mother reports of dressing had to be changed last Friday due to odor. He has not been systemically unwell 12/19/15; patient came in today complaining of increasing pain and tightness in his upper right thigh. He completed the ciprofloxacin 2 or 3 days ago. He is not running Shawn fever today. 12/26/2015 -- last week he had been seen by Dr. Dellia Nims who got Shawn lower extremity venous duplex evaluation done which did not show DVT or SVT in the right lower extremity. he had also put him on Augmentin in addition to the previous ciprofloxacin and he had received for 2 weeks 01/02/2016 -- -- was admitted to the hospital on 12/26/2015 and discharged on 12/29/2015 and was treated for cellulitis. He was also newly diagnosed with type 2 diabetes mellitus and outpatient monitoring and initiation of treatment was recommended. Patients hemoglobin A1c was 6.6 No osteomyelitis detected on x-rays and recent Doppler study was negative for DVT Oral doxycycline and Levaquin were recommended for the patient as an . outpatient for Shawn 14 day treatment. IV Zosyn and vancomycin was given due to concerns of Pseudomonas infection while he was in hospital. 02/13/2016 -- he has not gone back to [facility] where he had his vascular opinion earlier and I have urged him to regroup with them to see if there is any surgical options available. 02/27/2016 -- has an appointment to see [facility] vascular surgeons on January 3 and may be seeing Dr. Romie Levee 03/19/2016 -- I was not able to find any notes on Epic but the patient did go to the vascular clinic at [facility] and saw the PA to Dr. Romie Levee. I understand she has recommended Shawn CT scan and MRI to be done on February 1 and they would review this with him on the same day. 04/09/2016 -- was admitted to the hospital on  03/20/2016 acute respiratory failure with hypoxia, abdominal discomfort with erythema, hypertensive urgency and chronic wound of the right leg with morbid obesity. he was discharged home on 04/05/2016 and was to continue on IV antibiotics for 18 more days follow-up with the wound center and continue with his cardiologist. he has lost approximately 130 pounds and diuresed over 48 L. He was treated with IV Zosyn and ID recommended he continue this for 3 weeks more. The notes from [facility] wound clinic were noted where he was seen by the PA and she had taken cultures which grew MSSA and Pseudomonas. She had recommended edema control with Shawn 4-layer compression and also referred him to the lymphedema clinic. Shawn CT venogram was also planned for the future. 04/16/2016 -- at [facility] Shawn CT pelvic venogram runoff was done on 04/11/2016 -- IMPRESSION:-Limited study secondary to poor opacification of venous structures. No definite evidence of acute deep vein thrombosis. 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 -Extensive right iliac chain and right inguinal adenopathy, favored to be reactive/secondary to congestion. The patient continues on his IV antibiotics through his PICC line and has his cardiology opinion and also Shawn bariatric surgery opinion coming up. 04/30/2016 --  He has completed his IV antibiotics through his PICC line. 05/14/2016 --he was seen by the PA, Ms Sluss, recommended alternate day dressing with compression and use Hibiclens and Dakin's solution with acetic acid on his wounds. 07/30/2016 -- he has still not got his custom-made compression stockings and I have urged him to go and get him self measured for these. 08/06/2016 -- he has been measured for his custom stocking and will get in 2 weeks. He has lost 20 pounds since March 08/27/2016 -- he has not got his custom stockings yet and he tried another  pair which has not fit well and he has had Shawn lot of maceration increase the size of his wound. 09/03/2016 -- the patient says that he has not been very compliant with his dressing changes and his elevation and exercise and this week he is notices wound get rather large with maceration. 09/18/16; according to our intake nurse the measurements on this patient's wound are slightly larger. I note previous compliance concerns. I note that his wounds measured larger last week which was noted by Dr. Con Memos. Culture done last week was negative. 10/01/2016 -- the patient has received some lymphedema pumps which are new and he says he is being compliant with this. He is going to be away for 2 weeks on vacation to [facility] 10/15/2016 -- he returns after 2 weeks and tells me he has been diligent with his dressing and has lost 25 pounds over the last 3 months. 10/22/2016 -- his last hemoglobin A1c was 6.2 and he is now diet controlled. 11/19/2016 -- he has been having problems with his compression stockings which are custom made at [place]. He has not yet been able to get them. He had taken out his compression because he had gone there and had no dressing on when he came here today READMISSION 05/22/2018 Mr. Ruane is Shawn now 40 year old man with Shawn Werst history of wounds in his lower extremities secondary to chronic venous insufficiency with secondary lymphedema. He has had previous vein ligations on the right although I do not have this information in front of me. As I understand things he also has central venous obstruction with Shawn vein bypass in 2005 I believe this was done at [facility]. He tells me over the last 2 months he has noted mid reopening in the right mid anterior lower extremity. This is Shawn rectangular shaped wound which is kind of odd in terms of how that formed. He has been using silver alginate and Unna boots that we used on him when he was here in 2018. He buys his product supply online thinks this  is cheaper than ordering through intermediary's Past medical history; CHF, morbid obesity, hypertension, hyperlipidemia, chronic venous insufficiency, vein bypass in 2005 previous vein ablations or ligations. He has Shawn stent in the right lower extremity. ABI in this clinic was 1.04. He is using his compression pumps at home 3/27; 2-week follow-up. Right lower extremity wounds related to chronic venous insufficiency and lymphedema. He has been using silver alginate under an Haematologist. He change the [employer] himself at home last week. He is making nice progress 4/10; 2-week follow-up. Right lower extremity wound is just about closed Shawn small open area in the middle of the and large rectangular wound he had at the beginning. His edema control is excellent. He says he is using his compression pumps religiously 4/17; 2-week follow-up. Right lower extremity wound is totally closed. He had Shawn large rectangular wound  which is progressively closed. His edema control is very good. He is using his compression pumps once to twice Shawn day READMISSION 09/19/2020 Mr. Vandenbrink is now Shawn 40 year old man we have had for several stays in this clinic including 2017, 2018 and most recently in 2020 with Shawn wound on his right lower leg. He has compression pumps which he claims to be using twice Shawn day. He also has stockings however I am not sure how much he uses them. Things have broken down over the last month or 2 he has an extensive wound area on the right mid tibial area I think this is similar to what he has had in the past. He has been using Xeroform and an Ace wrap that his girlfriend is applying. Past medical history; the patient has known chronic venous insufficiency with secondary lymphedema. He had an iliac vein blockage and he had Shawn bypass at [facility] although the patient is not followed up with them. The surgeon may have been Dr. Haynes Kerns. As noted he has lymphedema pumps. Type 2 diabetes congestive heart failure obstructive  sleep apnea. He has Shawn history of Shawn right leg DVT although this may have been the iliac vein he is not currently on anticoagulation ABI in our clinic is 1.37 on the right Imaging Results - in this encounter CT Pelvic Venogram Run Off Imaging Results - CT Pelvic Venogram Run Off Impressions Performed At -Limited study secondary to poor opacification of venous structures. Nodefinite evidence of acute deep vein thrombosis. 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 -Extensive right iliac chain and right inguinal adenopathy, favored to bereactive/secondary to congestion. [facility] Imaging Results - CT Pelvic Venogram Run Off Narrative Performed At EXAM: CT PELVIC VENOGRAM RUN OFF DATE: 04/11/2016 1:28 PM ACCESSION: [id number] DICTATED: 04/11/2016 2:18 PM INTERPRETATION LOCATION: [place] CLINICAL INDICATION: 40 years old Male with h/o RLE DVT and massive lymphedema of both LE's. Eval for venous outflow obstruction vs extrinsic mass- L97.203-Skin ulcer of calf with necrosis of muscle, unspecified laterality(RAF-HCC) COMPARISON: CT abdomen pelvis dated 02/11/2007 TECHNIQUE: Shawn spiral CT scan was obtained approximately 3 minutes after administration of IV contrast from the kidneys to the knees.Images were reconstructed in the axial plane. Multiplanar reformatted and MIP images were provided for further evaluation of the vessels. For selected cases, 3D volumerendered images are also provided. VASCULAR FINDINGS: There is overall poor contrast opacification of the venous structures, limitingevaluation. IVC: No evidence of thrombus. Iliac veins: The right iliac veins are chronically occluded/diminutive in size. There are extensive venous collaterals noted throughout the right pelvis extending into the right lower extremity. The number and caliber of the venous collaterals have increased when compared to  02/11/2007. The left iliac veins appear patent. There are also small caliber venous collaterals within the left pelvis extending into the left lower extremity. Anterior abdominal wall venouscollaterals are also noted but fully imaged secondary to patient diameter. The bilateral femoral and popliteal veins appear patent. There is no evidence of acute thrombus. The arterial vasculature is unremarkable on this venous phase time study. NONVASCULAR FINDINGS: LOWER CHEST Unremarkable. : ABDOMEN: HEPATOBILIARY: Unremarkable liver. No biliary ductal dilatation. Gallbladder isremarkable. PANCREAS: Unremarkable. SPLEEN: Unremarkable. ADRENAL GLANDS: Unremarkable. KIDNEYS/URETERS: Unremarkable. BLADDER: Unremarkable. BOWEL/PERITONEUM/RETROPERITONEUM: No bowel obstruction. No acute inflammatoryprocess. No ascites. LYMPH NODES: Extensive right iliac chain and right inguinal adenopathy. Shawn nodal conglomerate in the right inguinal region measures 6.8 x 3.4 cm (2:124). This is only slightly  larger when compared to 02/11/2007. Given interval stabilityover 10 years, these are favored to be reactive/secondary to congestion. REPRODUCTIVE: Unremarkable. BONES/SOFT TISSUES: No acute osseous abnormalities. Postsurgical changes in the right inguinal and left medial thigh region. Right greater than left softtissue edema throughout the lower extremities. [place] [facility] Imaging Results - CT Pelvic Venogram Run Off Procedure Note Interface, Rad Results In - 04/11/2016 3:28 PM EST EXAM: CT PELVIC VENOGRAM RUN OFF DATE: 04/11/2016 1:28 PM ACCESSION: [id number] DICTATED: 04/11/2016 2:18 PM INTERPRETATION LOCATION: [place] CLINICAL INDICATION: 40 years old Male with h/o RLE DVT and massive lymphedema of both LE's. Eval for venous outflow obstruction vs extrinsic mass- L97.203-Skin ulcer of calf with necrosis of muscle, unspecified laterality (RAF-HCC) COMPARISON: CT abdomen pelvis dated 02/11/2007 TECHNIQUE: Shawn spiral CT  scan was obtained approximately 3 minutes after administration of IV contrast from the kidneys to the knees. Images were reconstructed in the axial plane. Multiplanar reformatted and MIP images were provided for further evaluation of the vessels. For selected cases, 3D volume rendered images are also provided. VASCULAR FINDINGS: There is overall poor contrast opacification of the venous structures, limiting evaluation. IVC: No evidence of thrombus. Iliac veins: The right iliac veins are chronically occluded/diminutive in size. There are extensive venous collaterals noted throughout the right pelvis extending into the right lower extremity. The number and caliber of the venous collaterals have increased when compared to 02/11/2007. The left iliac veins appear patent. There are also small caliber venous collaterals within the left pelvis extending into the left lower extremity. Anterior abdominal wall venous collaterals are also noted but fully imaged secondary to patient diameter. The bilateral femoral and popliteal veins appear patent. There is no evidence of acute thrombus. The arterial vasculature is unremarkable on this venous phase time study. NONVASCULAR FINDINGS: LOWER CHEST Unremarkable. : ABDOMEN: HEPATOBILIARY: Unremarkable liver. No biliary ductal dilatation. Gallbladder is remarkable. PANCREAS: Unremarkable. SPLEEN: Unremarkable. ADRENAL GLANDS: Unremarkable. KIDNEYS/URETERS: Unremarkable. BLADDER: Unremarkable. BOWEL/PERITONEUM/RETROPERITONEUM: No bowel obstruction. No acute inflammatory process. No ascites. LYMPH NODES: Extensive right iliac chain and right inguinal adenopathy. Shawn nodal conglomerate in the right inguinal region measures 6.8 x 3.4 cm (2:124). This is only slightly larger when compared to 02/11/2007. Given interval stability over 10 years, these are favored to be reactive/secondary to congestion. REPRODUCTIVE: Unremarkable. BONES/SOFT TISSUES: No acute osseous  abnormalities. Postsurgical changes in the right inguinal and left medial thigh region. Right greater than left soft tissue edema throughout the lower extremities. IMPRESSION: -Limited study secondary to poor opacification of venous structures. No definite evidence of acute deep vein thrombosis. 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 -Extensive right iliac chain and right inguinal adenopathy, favored to be reactive/secondary to congestion. Imaging Results - CT Pelvic Venogram Run Off Performing Organization Address City/State/Zipcode Phone Number [facility] [id number] [place] [address] Surgical summary as listed below; Assessment: Graysen Docherty is Shawn 40 y.o. male with [place] history of right lower extremity chronic obstructive deep venous disease with recurrent ulceration, absence of right iliac vein, prior left to right femoral vein bypass. Patient continues with recurrent ulceration, his weight precludes him from further vascular studies at this time. If he is able to lose 50-100 pounds we would be able to do left lower extremity venogram possible intervention with 50% chance of re- opening occlusion per Dr. Haynes Kerns. For global health, weight loss, potential improvement in his chronic venous insufficiency would appreciateinput from bariatric surgery group  for recommendations. 09/26/20; patient comes in today with not much improvement. He has also some odor. 3 open areas and this linear area in his mid calf require debridement. We have been using silver alginate under compression. I gave him Keflex last week for what I thought was cellulitis in the right medial thigh he is completing this and I think this is better. 7/26; patient comes in today with again necrotic debris over these wounds. According to our intake nurse extreme malodor and drainage. We have been using silver alginate under  compression. He is complaining about pain in the wound area. 7/29; Patient presents for follow-up. He reports improvement to the wound. He has finished taking Keflex. He has no issues or complaints today. 10/17/2020 upon evaluation today patient appears to be doing poorly in regard to his leg ulcer. He has been tolerating the dressing changes without complication. Fortunately there does not appear to be any signs of active infection at this time. No fevers, chills, nausea, vomiting, or diarrhea. 8/23; the patient has 2 areas anteriorly and Shawn larger area medially on the right lower leg. He comes in today with his compression wrap slipping down. We do not have good edema control. We have been using silver alginate. I believe he completed Shawn course of antibioticso Levaquin given to him 2 weeks ago at his last visit. He states his girlfriend who is Shawn nurse changes his dressings. We have not seen this in 2 weeks. He just received Shawn compounded topical antibiotic based on I believe Shawn deep tissue culture that was done. I do not have Shawn result of the deep tissue culture today and he forgot the compounded antibiotic. I will try to clarify this next time I tried to send him back to vascular at [facility] for review of his central venous obstruction although they would not even see him I think because of noncompliance, or at least perceived noncompliance with regards to the recommendations for weight loss, consideration of bariatric assessment for surgery etc. 8/30; although the patient's wounds anteriorly on the right lower leg extending medially were debrided today. Better looking wound surface he has better edema control still under 4-layer compression. He tells me he is using his compression pump twice Shawn day however they are greater than 56 years old and he might benefit from Shawn new set. He is using his [employer] compounded antibiotic Since the last time he was here he saw Dr. Doren Custard at vein and vascular at our request. He  was felt to have adequate arterial flow for healing although there was still the issue about whether he might need an angiogram if things still. 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 He did not talk about the s previous central venous procedures that have been done at [facility]. That information is available in care everywhere however based on the duplex exam he was not felt to be Shawn candidate for any venous procedures on the right. He has no significant venous reflux on the right and has had previously had Shawn right greater saphenous vein ablated 9/6; the patient has Shawn cluster of small areas over the right anterior mid tibia as well as an area medially. That the air 1 medially is more confluent all of them look like they have Shawn better surface. We have been putting him in 4-layer compression and using his compression pumps twice Shawn day 9/13; both of the patient's wound areas appear somewhat better especially the large area  medially. 9/27; 2-week follow-up. His wife who is Shawn Equities trader has been changing his 4-layer compression with Hydrofera Blue and [place] I think the measurements of the 2 remaining areas 1 on the right anterior tibia and 1 on the right lateral are better. In another setting I might consider advanced treatment product here. He is using his compression pumps 10/11; patient presents for follow-up. He has no issues or complaints today. His girlfriend continues to change his compression wrap with Hydrofera Blue and [place]. He denies signs of infection. 10/25; patient presents for follow-up. He reports Shawn new wound to the right lateral aspect. He states the area was itching and he scratched developing Shawn wound. He denies signs of infection. 11/11; patient presents for follow-up. He no longer has Shawn wound to the right lateral aspect. He had to use an Haematologist for the past week due to short supply. He has no issues or complaints today. He denies signs of  infection. Electronic Signature(s) Signed: 01/19/2021 12:52:34 PM By: Kalman Shan DO Entered By: Kalman Shan on 01/19/2021 12:49:17 -------------------------------------------------------------------------------- Physical Exam Details Patient Name: Date of Service: [name], Shawn Pane T. 01/19/2021 10:15 Shawn M Medical Record Number: [id number] Patient Account Number: [id number] Date of Birth/Sex: Treating RN: September 16, 1980 (40 y.o. M) Primary Care Provider: Dorna Mai Other Clinician: Referring Provider: Treating Provider/Extender: Ulla Potash in Treatment: 17 Constitutional respirations regular, non-labored and within target range for patient.. Cardiovascular 2+ dorsalis pedis/posterior tibialis pulses. Psychiatric pleasant and cooperative. Notes Patient has 1 wound to the right medial lower leg with fibrinous tissue and granulation tissue. Appears well-healing. No signs of infection. Electronic Signature(s) Signed: 01/19/2021 12:52:34 PM By: Kalman Shan DO Entered By: Kalman Shan on 01/19/2021 12:50:06 -------------------------------------------------------------------------------- Physician Orders Details Patient Name: Date of Service: [name], Shawn DRIA N T. 01/19/2021 10:15 Shawn M Medical Record Number: [id number] Patient Account Number: [id number] Date of Birth/Sex: Treating RN: 11-Aug-1980 (40 y.o. Burnadette Pop, [name] Primary Care Provider: Dorna Mai Other Clinician: Referring Provider: Treating Provider/Extender: Ulla Potash in Treatment: [phone] Verbal / Phone Orders: No Diagnosis Coding ICD-10 Coding Code Description I87.331 Chronic venous hypertension (idiopathic) with ulcer and inflammation of right lower extremity I89.0 Lymphedema, not elsewhere classified L97.818 Non-pressure chronic ulcer of other part of right lower leg with other specified severity Follow-up Appointments Return appointment in 3  weeks. - Dr. Heber Quincy Nurse Visit: - 2 weeks Bathing/ Shower/ Hygiene May shower with protection but do not get wound dressing(s) wet. - May use Shawn cast wrap to put over wraps to shower; you can buy these at [facility] or [facility] Edema Control - Lymphedema / SCD / Other Lymphedema Pumps. Use Lymphedema pumps on leg(s) 2-3 times Shawn day for 45-60 minutes. If wearing any wraps or hose, do not remove them. Continue exercising as instructed. Elevate legs to the level of the heart or above for 30 minutes daily and/or when sitting, Shawn frequency of: - throughout the day Avoid standing for Sakurai periods of time. Patient to wear own compression stockings every day. - on Left Leg Wound Treatment Wound #32 - Lower Leg Wound Laterality: Right, Medial Cleanser: Soap and Water 2 x Per Week/15 Days Discharge Instructions: May shower and wash wound with dial antibacterial soap and water prior to dressing change. Cleanser: Wound Cleanser (Generic) 2 x Per Week/15 Days Discharge Instructions: Cleanse the wound with wound cleanser prior to applying Shawn clean dressing using gauze sponges, not tissue or cotton balls. Peri-Wound Care: Triamcinolone  15 (g) 2 x Per Week/15 Days Discharge Instructions: Use triamcinolone 15 (g) as directed Peri-Wound Care: Sween Lotion (Moisturizing lotion) 2 x Per Week/15 Days Discharge Instructions: Apply moisturizing lotion as directed Topical: Keystone antibiotic gel 2 x Per Week/15 Days Discharge Instructions: Apply directly to wound bed, under primary dressing Prim Dressing: Hydrofera Blue Classic Foam, 4x4 in (Generic) 2 x Per Week/15 Days ary Discharge Instructions: Moisten with saline prior to applying to wound bed Secondary Dressing: ABD Pad, 5x9 (Generic) 2 x Per Week/15 Days Discharge Instructions: Apply over primary dressing as directed. Secondary Dressing: Zetuvit Plus 4x8 in (Generic) 2 x Per Week/15 Days Discharge Instructions: Apply over primary dressing as  directed. Secondary Dressing: CarboFLEX Odor Control Dressing, 4x4 in (Generic) 2 x Per Week/15 Days Discharge Instructions: Apply over primary dressing as directed. Compression Wrap: CoFlex TLC XL 2-layer Compression System 4x7 (in/yd) 2 x Per Week/15 Days Discharge Instructions: Apply CoFlex 2-layer compression as directed. (alt for 4 layer) Compression Stockings: Circaid Juxta Lite Compression Wrap (DME) Right Leg Compression Amount: 30-40 mmHG Discharge Instructions: Offer requesting for left leg as well. no wound on left leg but pt. wants to know pricing. Left leg same measurements as right leg. Electronic Signature(s) Signed: 01/19/2021 12:52:34 PM By: Kalman Shan DO Previous Signature: 01/19/2021 12:27:53 PM Version By: Rhae Hammock RN Entered By: Kalman Shan on 01/19/2021 12:50:25 -------------------------------------------------------------------------------- Problem List Details Patient Name: Date of Service: [name], Shawn DRIA N T. 01/19/2021 10:15 Shawn M Medical Record Number: [id number] Patient Account Number: [id number] Date of Birth/Sex: Treating RN: 05-01-1980 (40 y.o. M) Primary Care Provider: Dorna Mai Other Clinician: Referring Provider: Treating Provider/Extender: Ulla Potash in Treatment: 17 Active Problems ICD-10 Encounter Code Description Active Date MDM Diagnosis I87.331 Chronic venous hypertension (idiopathic) with ulcer and inflammation of right 09/19/2020 No Yes lower extremity I89.0 Lymphedema, not elsewhere classified 09/19/2020 No Yes L97.818 Non-pressure chronic ulcer of other part of right lower leg with other specified 09/19/2020 No Yes severity Inactive Problems Resolved Problems Electronic Signature(s) Signed: 01/19/2021 12:52:34 PM By: Kalman Shan DO Entered By: Kalman Shan on 01/19/2021 12:47:46 -------------------------------------------------------------------------------- Progress Note  Details Patient Name: Date of Service: [name], Shawn Pane T. 01/19/2021 10:15 Shawn M Medical Record Number: [id number] Patient Account Number: [id number] Date of Birth/Sex: Treating RN: 1980/10/11 (40 y.o. M) Primary Care Provider: Dorna Mai Other Clinician: Referring Provider: Treating Provider/Extender: Ulla Potash in Treatment: 17 Subjective Chief Complaint Information obtained from Patient pt has blockage or absence of r iliac vein 05/22/2018; patient is here for review of wound on his right lower extremity 09/19/2020; patient is again here for wound review of wounds on his right lower leg History of Present Illness (HPI) The patient is Shawn 40 yrs old bm here for evaluation of his right leg ulcer. He has an extensive history of lymphedema and ulcers. He is being treated at the [facility] by Dr. Lindon Romp with Louretta Parma boots and the [facility]. He has pumps at home but he is only using them once Shawn day. 08/30/14 selective debridement done of surface eschar. The wound cleans up quite nicely in the bed of this looks healthy. He is using Shawn wound VAC under an Unna wrap. 11/17/14; selective debridement done of surface eschar. Once again the wound beds at clean up quite nicely. He is no longer using Shawn wound VAC. Recent dressing changes include Hydrofera Blue. Apparently he has done Shawn wide variety of different topical dressings including advanced treatment options like  Apligraf's without success. 03/21/15; surgical debridement done of surface eschar and nonviable subcutaneous tissue. The area on the medial aspect of the right leg has closed and the wound on the lateral right leg looks improved has been using Silver Collagen 04/11/15; I think attendance here is sporadic. The area on the right medial leg remains healed. He has Shawn new tiny area on the back of the right leg. Again Shawn surgical debridement of the surface eschar and nonviable subcutaneous tissue of the major wound on the right lateral leg. He  has been using Silver Collagen and at home, he does his own Unna wraps at home edema control seems reasonable. 04/20/15; the area on the right medial leg has Shawn very superficial area which may not even be open nevertheless I felt needed to be dressed today [this is his original chronic wound] the new wound is on the right anterior lateral leg is underwent Shawn surgical debridement. He has Shawn small wound on the right posterior leg. He puts on his own Unna boots, according to our nurses he does this fairly well. We have been using Silver collagen. 05/02/2015 -- I understand in the past his venous studies have been done at [facility] and he was advised weight loss before they would attempt to tackle his iliac vein blockage. He has not gone back for review. 06/27/2015 -- we have applied for Apligraf and are awaiting his insurance clearance. 07/25/2015 -- he still has to use her back from his insurance agent regarding his copayment for his Apligraf. 07/31/2015 -- the patient got Shawn reply from the insurance agent regarding the dollar payment for his application but is not sure whether it is for 5 or for one. 11/28/2015 -- has been hurting Shawn little bit more and he has had change in color of his wound especially on the medial part. 12/05/2015 -- his culture was positive for Proteus mirabilis and K Oxytoca which are sensitive to ciprofloxacin which he is already on. 12/12/15oostill on ciprofloxacin. His mother reports of dressing had to be changed last Friday due to odor. He has not been systemically unwell 12/19/15; patient came in today complaining of increasing pain and tightness in his upper right thigh. He completed the ciprofloxacin 2 or 3 days ago. He is not running Shawn fever today. 12/26/2015 -- last week he had been seen by Dr. Dellia Nims who got Shawn lower extremity venous duplex evaluation done which did not show DVT or SVT in the right lower extremity. he had also put him on Augmentin in addition to the previous  ciprofloxacin and he had received for 2 weeks 01/02/2016 -- -- was admitted to the hospital on 12/26/2015 and discharged on 12/29/2015 and was treated for cellulitis. He was also newly diagnosed with type 2 diabetes mellitus and outpatient monitoring and initiation of treatment was recommended. Patientoos hemoglobin A1c was 6.6 No osteomyelitis detected on x-rays and recent Doppler study was negative for DVT Oral doxycycline and Levaquin were recommended for the patient as an . outpatient for Shawn 14 day treatment. IV Zosyn and vancomycin was given due to concerns of Pseudomonas infection while he was in hospital. 02/13/2016 -- he has not gone back to [facility] where he had his vascular opinion earlier and I have urged him to regroup with them to see if there is any surgical options available. 02/27/2016 -- has an appointment to see [facility] vascular surgeons on January 3 and may be seeing Dr. Romie Levee 03/19/2016 -- I  was not able to find any notes on Epic but the patient did go to the vascular clinic at [facility] and saw the PA to Dr. Romie Levee. I understand she has recommended Shawn CT scan and MRI to be done on February 1 and they would review this with him on the same day. 04/09/2016 -- was admitted to the hospital on 03/20/2016 acute respiratory failure with hypoxia, abdominal discomfort with erythema, hypertensive urgency and chronic wound of the right leg with morbid obesity. he was discharged home on 04/05/2016 and was to continue on IV antibiotics for 18 more days follow-up with the wound center and continue with his cardiologist. he has lost approximately 130 pounds and diuresed over 48 L. He was treated with IV Zosyn and ID recommended he continue this for 3 weeks more. The notes from [facility] wound clinic were noted where he was seen by the PA and she had taken cultures which grew MSSA and Pseudomonas. She had recommended edema control with Shawn 4-layer compression and also  referred him to the lymphedema clinic. Shawn CT venogram was also planned for the future. 04/16/2016 -- at [facility] Shawn CT pelvic venogram runoff was done on 04/11/2016 -- IMPRESSION:-Limited study secondary to poor opacification of venous structures. No definite evidence of acute deep vein thrombosis. 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 -Extensive right iliac chain and right inguinal adenopathy, favored to be reactive/secondary to congestion. The patient continues on his IV antibiotics through his PICC line and has his cardiology opinion and also Shawn bariatric surgery opinion coming up. 04/30/2016 -- He has completed his IV antibiotics through his PICC line. 05/14/2016 --he was seen by the PA, Ms Sluss, recommended alternate day dressing with compression and use Hibiclens and Dakin's solution with acetic acid on his wounds. 07/30/2016 -- he has still not got his custom-made compression stockings and I have urged him to go and get him self measured for these. 08/06/2016 -- he has been measured for his custom stocking and will get in 2 weeks. He has lost 20 pounds since March 08/27/2016 -- he has not got his custom stockings yet and he tried another pair which has not fit well and he has had Shawn lot of maceration increase the size of his wound. 09/03/2016 -- the patient says that he has not been very compliant with his dressing changes and his elevation and exercise and this week he is notices wound get rather large with maceration. 09/18/16; according to our intake nurse the measurements on this patient's wound are slightly larger. I note previous compliance concerns. I note that his wounds measured larger last week which was noted by Dr. Con Memos. Culture done last week was negative. 10/01/2016 -- the patient has received some lymphedema pumps which are new and he says he is being compliant with this. He is  going to be away for 2 weeks on vacation to [facility] 10/15/2016 -- he returns after 2 weeks and tells me he has been diligent with his dressing and has lost 25 pounds over the last 3 months. 10/22/2016 -- his last hemoglobin A1c was 6.2 and he is now diet controlled. 11/19/2016 -- he has been having problems with his compression stockings which are custom made at [place]. He has not yet been able to get them. He had taken out his compression because he had gone there and had no dressing on when he came here  today READMISSION 05/22/2018 Mr. Rennaker is Shawn now 40 year old man with Shawn Mages history of wounds in his lower extremities secondary to chronic venous insufficiency with secondary lymphedema. He has had previous vein ligations on the right although I do not have this information in front of me. 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 He tells me over the last 2 months he has noted mid reopening in the right mid anterior lower extremity. This is Shawn rectangular shaped wound which is kind of odd in terms of how that formed. He has been using silver alginate and Unna boots that we used on him when he was here in 2018. He buys his product supply online thinks this is cheaper than ordering through intermediary's Past medical history; CHF, morbid obesity, hypertension, hyperlipidemia, chronic venous insufficiency, vein bypass in 2005 previous vein ablations or ligations. He has Shawn stent in the right lower extremity. ABI in this clinic was 1.04. He is using his compression pumps at home 3/27; 2-week follow-up. Right lower extremity wounds related to chronic venous insufficiency and lymphedema. He has been using silver alginate under an Haematologist. He change the [employer] himself at home last week. He is making nice progress 4/10; 2-week follow-up. Right lower extremity wound is just about closed Shawn small open area in the  middle of the and large rectangular wound he had at the beginning. His edema control is excellent. He says he is using his compression pumps religiously 4/17; 2-week follow-up. Right lower extremity wound is totally closed. He had Shawn large rectangular wound which is progressively closed. His edema control is very good. He is using his compression pumps once to twice Shawn day READMISSION 09/19/2020 Mr. Vanengen is now Shawn 40 year old man we have had for several stays in this clinic including 2017, 2018 and most recently in 2020 with Shawn wound on his right lower leg. He has compression pumps which he claims to be using twice Shawn day. He also has stockings however I am not sure how much he uses them. Things have broken down over the last month or 2 he has an extensive wound area on the right mid tibial area I think this is similar to what he has had in the past. He has been using Xeroform and an Ace wrap that his girlfriend is applying. Past medical history; the patient has known chronic venous insufficiency with secondary lymphedema. He had an iliac vein blockage and he had Shawn bypass at [facility] although the patient is not followed up with them. The surgeon may have been Dr. Haynes Kerns. As noted he has lymphedema pumps. Type 2 diabetes congestive heart failure obstructive sleep apnea. He has Shawn history of Shawn right leg DVT although this may have been the iliac vein he is not currently on anticoagulation ABI in our clinic is 1.37 on the right Imaging Results - in this encounter CT Pelvic Venogram Run Off Imaging Results - CT Pelvic Venogram Run Off Impressions Performed At -Limited study secondary to poor opacification of venous structures. Nodefinite evidence of acute deep vein thrombosis. 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 -Extensive right iliac chain and right inguinal adenopathy, favored to bereactive/secondary to  congestion. [facility] RAD Imaging Results - CT Pelvic Venogram Run Off Narrative Performed At EXAM: CT PELVIC VENOGRAM RUN OFF DATE: 04/11/2016 1:28 PM ACCESSION: [id number] Candida Peeling  DICTATED: 04/11/2016 2:18 PM INTERPRETATION LOCATION: [place]: 40 years old Male with h/o RLE DVT and massive lymphedema of both LE's. Eval for venous outflow obstruction vs extrinsic mass- L97.203-Skin ulcer of calf with necrosis of muscle, unspecified laterality(RAF-HCC) COMPARISON: CT abdomen pelvis dated 02/11/2007 TECHNIQUE: Shawn spiral CT scan was obtained approximately 3 minutes after administration of IV contrast from the kidneys to the knees. Images were reconstructed in the axial plane. Multiplanar reformatted and MIP images were provided for further evaluation of the vessels. For selected cases, 3D volumerendered images are also provided. VASCULAR FINDINGS: There is overall poor contrast opacification of the venous structures, limitingevaluation. IVC: No evidence of thrombus. Iliac veins: The right iliac veins are chronically occluded/diminutive in size. There are extensive venous collaterals noted throughout the right pelvis extending into the right lower extremity. The number and caliber of the venous collaterals have increased when compared to 02/11/2007. The left iliac veins appear patent. There are also small caliber venous collaterals within the left pelvis extending into the left lower extremity. Anterior abdominal wall venouscollaterals are also noted but fully imaged secondary to patient diameter. The bilateral femoral and popliteal veins appear patent. There is no evidence of acute thrombus. The arterial vasculature is unremarkable on this venous phase time study. NONVASCULAR FINDINGS: LOWER CHEST Unremarkable. : ABDOMEN: HEPATOBILIARY: Unremarkable liver. No biliary ductal dilatation. Gallbladder isremarkable. PANCREAS: Unremarkable. SPLEEN: Unremarkable. ADRENAL GLANDS:  Unremarkable. KIDNEYS/URETERS: Unremarkable. BLADDER: Unremarkable. BOWEL/PERITONEUM/RETROPERITONEUM: No bowel obstruction. No acute inflammatoryprocess. No ascites. LYMPH NODES: Extensive right iliac chain and right inguinal adenopathy. Shawn nodal conglomerate in the right inguinal region measures 6.8 x 3.4 cm (2:124). This is only slightly larger when compared to 02/11/2007. Given interval stabilityover 10 years, these are favored to be reactive/secondary to congestion. REPRODUCTIVE: Unremarkable. BONES/SOFT TISSUES: No acute osseous abnormalities. Postsurgical changes in the right inguinal and left medial thigh region. Right greater than left softtissue edema throughout the lower extremities. [place] RAD Imaging Results - CT Pelvic Venogram Run Off Procedure Note Interface, Rad Results In - 04/11/2016 3:28 PM EST EXAM: CT PELVIC VENOGRAM RUN OFF DATE: 04/11/2016 1:28 PM ACCESSION: [id number] DICTATED: 04/11/2016 2:18 PM INTERPRETATION LOCATION: [place] CLINICAL INDICATION: 40 years old Male with h/o RLE DVT and massive lymphedema of both LE's. Eval for venous outflow obstruction vs extrinsic mass- L97.203-Skin ulcer of calf with necrosis of muscle, unspecified laterality (RAF-HCC) COMPARISON: CT abdomen pelvis dated 02/11/2007 TECHNIQUE: Shawn spiral CT scan was obtained approximately 3 minutes after administration of IV contrast from the kidneys to the knees. Images were reconstructed in the axial plane. Multiplanar reformatted and MIP images were provided for further evaluation of the vessels. For selected cases, 3D volume rendered images are also provided. VASCULAR FINDINGS: There is overall poor contrast opacification of the venous structures, limiting evaluation. IVC: No evidence of thrombus. Iliac veins: The right iliac veins are chronically occluded/diminutive in size. There are extensive venous collaterals noted throughout the right pelvis extending into the right lower extremity. The  number and caliber of the venous collaterals have increased when compared to 02/11/2007. The left iliac veins appear patent. There are also small caliber venous collaterals within the left pelvis extending into the left lower extremity. Anterior abdominal wall venous collaterals are also noted but fully imaged secondary to patient diameter. The bilateral femoral and popliteal veins appear patent. There is no evidence of acute thrombus. The arterial vasculature is unremarkable on this venous phase time study. NONVASCULAR FINDINGS: LOWER CHEST Unremarkable. : ABDOMEN: HEPATOBILIARY: Unremarkable liver.  No biliary ductal dilatation. Gallbladder is remarkable. PANCREAS: Unremarkable. SPLEEN: Unremarkable. ADRENAL GLANDS: Unremarkable. KIDNEYS/URETERS: Unremarkable. BLADDER: Unremarkable. BOWEL/PERITONEUM/RETROPERITONEUM: No bowel obstruction. No acute inflammatory process. No ascites. LYMPH NODES: Extensive right iliac chain and right inguinal adenopathy. Shawn nodal conglomerate in the right inguinal region measures 6.8 x 3.4 cm (2:124). This is only slightly larger when compared to 02/11/2007. Given interval stability over 10 years, these are favored to be reactive/secondary to congestion. REPRODUCTIVE: Unremarkable. BONES/SOFT TISSUES: No acute osseous abnormalities. Postsurgical changes in the right inguinal and left medial thigh region. Right greater than left soft tissue edema throughout the lower extremities. IMPRESSION: -Limited study secondary to poor opacification of venous structures. No definite evidence of acute deep vein thrombosis. 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 -Extensive right iliac chain and right inguinal adenopathy, favored to be reactive/secondary to congestion. Imaging Results - CT Pelvic Venogram Run Off Performing Organization Address City/State/Zipcode Phone  Number [facility] [address] [place], [place] [address] Surgical summary as listed below; Assessment: Lonell Stamos is Shawn 40 y.o. male with Chait history of right lower extremity chronic obstructive deep venous disease with recurrent ulceration, absence of right iliac vein, prior left to right femoral vein bypass. Patient continues with recurrent ulceration, his weight precludes him from further vascular studies at this time. If he is able to lose 50-100 pounds we would be able to do left lower extremity venogram possible intervention with 50% chance of re- opening occlusion per Dr. Jacolyn Reedy. For global health, weight loss, potential improvement in his chronic venous insufficiency would appreciateinput from bariatric surgery group for recommendations. 09/26/20; patient comes in today with not much improvement. He has also some odor. 3 open areas and this linear area in his mid calf require debridement. We have been using silver alginate under compression. I gave him Keflex last week for what I thought was cellulitis in the right medial thigh he is completing this and I think this is better. 7/26; patient comes in today with again necrotic debris over these wounds. According to our intake nurse extreme malodor and drainage. We have been using silver alginate under compression. He is complaining about pain in the wound area. 7/29; Patient presents for follow-up. He reports improvement to the wound. He has finished taking Keflex. He has no issues or complaints today. 10/17/2020 upon evaluation today patient appears to be doing poorly in regard to his leg ulcer. He has been tolerating the dressing changes without complication. Fortunately there does not appear to be any signs of active infection at this time. No fevers, chills, nausea, vomiting, or diarrhea. 8/23; the patient has 2 areas anteriorly and Shawn larger area medially on the right lower leg. He comes in today with his compression wrap slipping down. We do  not have good edema control. We have been using silver alginate. I believe he completed Shawn course of antibioticso Levaquin given to him 2 weeks ago at his last visit. He states his girlfriend who is Shawn nurse changes his dressings. We have not seen this in 2 weeks. He just received Shawn compounded topical antibiotic based on I believe Shawn deep tissue culture that was done. I do not have Shawn result of the deep tissue culture today and he forgot the compounded antibiotic. I will try to clarify this next time I tried to send him back to vascular at [facility] for review of his central venous obstruction although they would  not even see him I think because of noncompliance, or at least perceived noncompliance with regards to the recommendations for weight loss, consideration of bariatric assessment for surgery etc. 8/30; although the patient's wounds anteriorly on the right lower leg extending medially were debrided today. Better looking wound surface he has better edema control still under 4-layer compression. He tells me he is using his compression pump twice Shawn day however they are greater than 31 years old and he might benefit from Shawn new set. He is using his [employer] compounded antibiotic Since the last time he was here he saw Dr. Doren Custard at vein and vascular at our request. He was felt to have adequate arterial flow for healing although there was still the issue about whether he might need an angiogram if things still. 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 He did not talk about the s previous central venous procedures that have been done at [facility] That information is available in care everywhere however based on the duplex exam he was not felt to be Shawn candidate for any venous procedures on the right. He has no significant venous reflux on the right and has had previously had Shawn right greater saphenous vein ablated 9/6; the patient has Shawn cluster of small areas over the right anterior mid tibia as  well as an area medially. That the air 1 medially is more confluent all of them look like they have Shawn better surface. We have been putting him in 4-layer compression and using his compression pumps twice Shawn day 9/13; both of the patient's wound areas appear somewhat better especially the large area medially. 9/27; 2-week follow-up. His wife who is Shawn Equities trader has been changing his 4-layer compression with Hydrofera Blue and Keystone I think the measurements of the 2 remaining areas 1 on the right anterior tibia and 1 on the right lateral are better. In another setting I might consider advanced treatment product here. He is using his compression pumps 10/11; patient presents for follow-up. He has no issues or complaints today. His girlfriend continues to change his compression wrap with Hydrofera Blue and [place]. He denies signs of infection. 10/25; patient presents for follow-up. He reports Shawn new wound to the right lateral aspect. He states the area was itching and he scratched developing Shawn wound. He denies signs of infection. 11/11; patient presents for follow-up. He no longer has Shawn wound to the right lateral aspect. He had to use an Haematologist for the past week due to short supply. He has no issues or complaints today. He denies signs of infection. Patient History Information obtained from Patient. Family History Diabetes - Mother, No family history of Cancer, Heart Disease, Hereditary Spherocytosis, Hypertension, Kidney Disease, Lung Disease, Seizures, Stroke, Thyroid Problems, Tuberculosis. Social History Unknown if ever smoked, Marital Status - Single, Alcohol Use - Rarely, Drug Use - No History. Medical History Eyes Denies history of Cataracts, Glaucoma, Optic Neuritis Ear/Nose/Mouth/Throat Denies history of Chronic sinus problems/congestion, Middle ear problems Hematologic/Lymphatic Patient has history of Lymphedema - right leg Denies history of Anemia, Hemophilia, Human  Immunodeficiency Virus, Sickle Cell Disease Respiratory Denies history of Aspiration, Asthma, Chronic Obstructive Pulmonary Disease (COPD), Pneumothorax, Sleep Apnea, Tuberculosis Cardiovascular Patient has history of Deep Vein Thrombosis - hx right leg, Hypertension, Peripheral Venous Disease - venous insufficiency and varicosities Denies history of Angina, Arrhythmia, Congestive Heart Failure, Coronary Artery Disease, Hypotension, Myocardial Infarction, Peripheral Arterial Disease, Phlebitis, Vasculitis Gastrointestinal Denies history of  Cirrhosis , Colitis, Crohnoos, Hepatitis Shawn, Hepatitis B, Hepatitis C Endocrine Patient has history of Type II Diabetes Denies history of Type I Diabetes Genitourinary Denies history of End Stage Renal Disease Immunological Denies history of Lupus Erythematosus, Raynaudoos, Scleroderma Integumentary (Skin) Denies history of History of Burn Musculoskeletal Denies history of Gout, Rheumatoid Arthritis, Osteoarthritis, Osteomyelitis Neurologic Denies history of Dementia, Neuropathy, Quadriplegia, Paraplegia, Seizure Disorder Oncologic Denies history of Received Chemotherapy, Received Radiation Psychiatric Denies history of Anorexia/bulimia, Confinement Anxiety Hospitalization/Surgery History - tonsillectomy. - right knee ligament repair. Medical Shawn Surgical History Notes nd Constitutional Symptoms (General Health) morbid obesity Objective Constitutional respirations regular, non-labored and within target range for patient.. Vitals Time Taken: 10:35 AM, Height: 74 in, Weight: 425 lbs, BMI: 54.6. Cardiovascular 2+ dorsalis pedis/posterior tibialis pulses. Psychiatric pleasant and cooperative. General Notes: Patient has 1 wound to the right medial lower leg with fibrinous tissue and granulation tissue. Appears well-healing. No signs of infection. Integumentary (Hair, Skin) Wound #32 status is Open. Original cause of wound was Gradually Appeared.  The date acquired was: 07/11/2020. The wound has been in treatment 17 weeks. The wound is located on the Right,Medial Lower Leg. The wound measures 0.5cm length x 1cm width x 0.1cm depth; 0.393cm^2 area and 0.039cm^3 volume. There is Fat Layer (Subcutaneous Tissue) exposed. There is no tunneling or undermining noted. There is Shawn large amount of serosanguineous drainage noted. The wound margin is distinct with the outline attached to the wound base. There is no granulation within the wound bed. There is Shawn large (67-100%) amount of necrotic tissue within the wound bed including Adherent Slough. Wound #35 status is Healed - Epithelialized. Original cause of wound was Gradually Appeared. The date acquired was: 01/02/2021. The wound has been in treatment 2 weeks. The wound is located on the Right,Anterior Lower Leg. The wound measures 0cm length x 0cm width x 0cm depth; 0cm^2 area and 0cm^3 volume. There is Fat Layer (Subcutaneous Tissue) exposed. There is Shawn medium amount of serosanguineous drainage noted. The wound margin is distinct with the outline attached to the wound base. There is large (67-100%) red granulation within the wound bed. There is no necrotic tissue within the wound bed. Assessment Active Problems ICD-10 Chronic venous hypertension (idiopathic) with ulcer and inflammation of right lower extremity Lymphedema, not elsewhere classified Non-pressure chronic ulcer of other part of right lower leg with other specified severity Patient's right lateral lower extremity wound is healed. The medial wound remains although it appears well-healing. I recommended continuing Hydrofera Blue under 2 layer Coflex. He states he is able to obtain compression wraps again. He likes to follow-up every 2 to 3 weeks. His girlfriend changes the wraps every 7 days. Would like for him to follow-up in 2 weeks for nurse visit and then in 3 weeks with me. Procedures Wound #32 Pre-procedure diagnosis of Wound #32 is Shawn  Lymphedema located on the Right,Medial Lower Leg . There was Shawn Four Layer Compression Therapy Procedure by Rhae Hammock, RN. Post procedure Diagnosis Wound #32: Same as Pre-Procedure Plan Follow-up Appointments: Return appointment in 3 weeks. - Dr. Heber Williams Nurse Visit: - 2 weeks Bathing/ Shower/ Hygiene: May shower with protection but do not get wound dressing(s) wet. - May use Shawn cast wrap to put over wraps to shower; you can buy these at [facility] or [facility] Edema Control - Lymphedema / SCD / Other: Lymphedema Pumps. Use Lymphedema pumps on leg(s) 2-3 times Shawn day for 45-60 minutes. If wearing any wraps or hose, do not remove  them. Continue exercising as instructed. Elevate legs to the level of the heart or above for 30 minutes daily and/or when sitting, Shawn frequency of: - throughout the day Avoid standing for Alejandro periods of time. Patient to wear own compression stockings every day. - on Left Leg WOUND #32: - Lower Leg Wound Laterality: Right, Medial Cleanser: Soap and Water 2 x Per Week/15 Days Discharge Instructions: May shower and wash wound with dial antibacterial soap and water prior to dressing change. Cleanser: Wound Cleanser (Generic) 2 x Per Week/15 Days Discharge Instructions: Cleanse the wound with wound cleanser prior to applying Shawn clean dressing using gauze sponges, not tissue or cotton balls. Peri-Wound Care: Triamcinolone 15 (g) 2 x Per Week/15 Days Discharge Instructions: Use triamcinolone 15 (g) as directed Peri-Wound Care: Sween Lotion (Moisturizing lotion) 2 x Per Week/15 Days Discharge Instructions: Apply moisturizing lotion as directed Topical: Keystone antibiotic gel 2 x Per Week/15 Days Discharge Instructions: Apply directly to wound bed, under primary dressing Prim Dressing: Hydrofera Blue Classic Foam, 4x4 in (Generic) 2 x Per Week/15 Days ary Discharge Instructions: Moisten with saline prior to applying to wound bed Secondary Dressing: ABD Pad, 5x9 (Generic) 2  x Per Week/15 Days Discharge Instructions: Apply over primary dressing as directed. Secondary Dressing: Zetuvit Plus 4x8 in (Generic) 2 x Per Week/15 Days Discharge Instructions: Apply over primary dressing as directed. Secondary Dressing: CarboFLEX Odor Control Dressing, 4x4 in (Generic) 2 x Per Week/15 Days Discharge Instructions: Apply over primary dressing as directed. Com pression Wrap: CoFlex TLC XL 2-layer Compression System 4x7 (in/yd) 2 x Per Week/15 Days Discharge Instructions: Apply CoFlex 2-layer compression as directed. (alt for 4 layer) Com pression Stockings: Circaid Juxta Lite Compression Wrap (DME) Compression Amount: 30-40 mmHg (right) Discharge Instructions: Offer requesting for left leg as well. no wound on left leg but pt. wants to know pricing. Left leg same measurements as right leg. 1. Hydrofera Blue under 2 layer Coflex 2. Follow-up in 2 weeks for nurse visit and following week with me Electronic Signature(s) Signed: 01/19/2021 12:52:34 PM By: Kalman Shan DO Entered By: Kalman Shan on 01/19/2021 12:51:52 -------------------------------------------------------------------------------- HxROS Details Patient Name: Date of Service: [name], Shawn DRIA N T. 01/19/2021 10:15 Shawn M Medical Record Number: [id number] Patient Account Number: [id number] Date of Birth/Sex: Treating RN: Jan 28, 1981 (40 y.o. M) Primary Care Provider: Dorna Mai Other Clinician: Referring Provider: Treating Provider/Extender: Ulla Potash in Treatment: 17 Information Obtained From Patient Constitutional Symptoms (General Health) Medical History: Past Medical History Notes: morbid obesity Eyes Medical History: Negative for: Cataracts; Glaucoma; Optic Neuritis Ear/Nose/Mouth/Throat Medical History: Negative for: Chronic sinus problems/congestion; Middle ear problems Hematologic/Lymphatic Medical History: Positive for: Lymphedema - right leg Negative  for: Anemia; Hemophilia; Human Immunodeficiency Virus; Sickle Cell Disease Respiratory Medical History: Negative for: Aspiration; Asthma; Chronic Obstructive Pulmonary Disease (COPD); Pneumothorax; Sleep Apnea; Tuberculosis Cardiovascular Medical History: Positive for: Deep Vein Thrombosis - hx right leg; Hypertension; Peripheral Venous Disease - venous insufficiency and varicosities Negative for: Angina; Arrhythmia; Congestive Heart Failure; Coronary Artery Disease; Hypotension; Myocardial Infarction; Peripheral Arterial Disease; Phlebitis; Vasculitis Gastrointestinal Medical History: Negative for: Cirrhosis ; Colitis; Crohns; Hepatitis Shawn; Hepatitis B; Hepatitis C Endocrine Medical History: Positive for: Type II Diabetes Negative for: Type I Diabetes Time with diabetes: 3 years Treated with: Oral agents, Diet Blood sugar tested every day: No Genitourinary Medical History: Negative for: End Stage Renal Disease Immunological Medical History: Negative for: Lupus Erythematosus; Raynauds; Scleroderma Integumentary (Skin) Medical History: Negative for: History of Burn Musculoskeletal  Medical History: Negative for: Gout; Rheumatoid Arthritis; Osteoarthritis; Osteomyelitis Neurologic Medical History: Negative for: Dementia; Neuropathy; Quadriplegia; Paraplegia; Seizure Disorder Oncologic Medical History: Negative for: Received Chemotherapy; Received Radiation Psychiatric Medical History: Negative for: Anorexia/bulimia; Confinement Anxiety Immunizations Pneumococcal Vaccine: Received Pneumococcal Vaccination: No Implantable Devices None Hospitalization / Surgery History Type of Hospitalization/Surgery tonsillectomy right knee ligament repair Family and Social History Cancer: No; Diabetes: Yes - Mother; Heart Disease: No; Hereditary Spherocytosis: No; Hypertension: No; Kidney Disease: No; Lung Disease: No; Seizures: No; Stroke: No; Thyroid Problems: No; Tuberculosis: No;  Unknown if ever smoked; Marital Status - Single; Alcohol Use: Rarely; Drug Use: No History; Financial Concerns: No; Food, Clothing or Shelter Needs: No; Support System Lacking: No; Transportation Concerns: No Electronic Signature(s) Signed: 01/19/2021 12:52:34 PM By: Kalman Shan DO Entered By: Kalman Shan on 01/19/2021 12:49:26 -------------------------------------------------------------------------------- SuperBill Details Patient Name: Date of Service: [name], Shawn Iona Beard T. 01/19/2021 Medical Record Number: [id number] Patient Account Number: [id number] Date of Birth/Sex: Treating RN: October 19, 1980 (40 y.o. 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

## 2021-01-23 NOTE — Progress Notes (Signed)
[name] ([id number]) . Visit Report for 01/19/2021 Arrival Information Details Patient Name: Date of Service: [name] 01/19/2021 10:15 A M Medical Record Number: [id number] Patient Account Number: [id number] Date of Birth/Sex: Treating RN: [name] (40 y.o. [name], [name] Primary Care Katelen Luepke: Georganna Skeans Other Clinician: Referring Maxamillion Banas: Treating Zyniah Ferraiolo/Extender: Mackie Pai in Treatment: 17 Visit Information History Since Last Visit Added or deleted any medications: No Patient Arrived: Ambulatory Any new allergies or adverse reactions: No Arrival Time: 10:34 Had a fall or experienced change in No Accompanied By: self activities of daily living that may affect Transfer Assistance: Manual risk of falls: Patient Identification Verified: Yes Signs or symptoms of abuse/neglect since last visito No Secondary Verification Process Completed: Yes Hospitalized since last visit: No Patient Requires Transmission-Based Precautions: No Implantable device outside of the clinic excluding No Patient Has Alerts: No cellular tissue based products placed in the center since last visit: Has Dressing in Place as Prescribed: Yes Has Compression in Place as Prescribed: Yes Pain Present Now: No Electronic Signature(s) Signed: 01/19/2021 12:27:53 PM By: Fonnie Mu RN Entered By: Fonnie Mu on 01/19/2021 10:35:13 -------------------------------------------------------------------------------- Compression Therapy Details Patient Name: Date of Service: [name], [name]. 01/19/2021 10:15 A M Medical Record Number: [id number] Patient Account Number: [id number] Date of Birth/Sex: Treating RN: [name] (40 y.o. [name], [name] Primary Care Piya Mesch: Georganna Skeans Other Clinician: Referring Shaneka Efaw: Treating Neeraj Housand/Extender: Mackie Pai in Treatment: 17 Compression Therapy Performed for  Wound Assessment: Wound #32 Right,Medial Lower Leg Performed By: Clinician Fonnie Mu, RN Compression Type: Four Layer Post Procedure Diagnosis Same as Pre-procedure Electronic Signature(s) Signed: 01/19/2021 12:27:53 PM By: Fonnie Mu RN Entered By: Fonnie Mu on 01/19/2021 11:24:33 -------------------------------------------------------------------------------- Encounter Discharge Information Details Patient Name: Date of Service: [name], [name]. 01/19/2021 10:15 A M Medical Record Number: [id number] Patient Account Number: [id number] Date of Birth/Sex: Treating RN: [birth year] (40 y.o. [name], [name] Primary Care Farrie Sann: Georganna Skeans Other Clinician: Referring Jaelyne Deeg: Treating Salihah Peckham/Extender: Mackie Pai in Treatment: 17 Encounter Discharge Information Items Discharge Condition: Stable Ambulatory Status: Ambulatory Discharge Destination: Home Transportation: Private Auto Accompanied By: self Schedule Follow-up Appointment: Yes Clinical Summary of Care: Patient Declined Electronic Signature(s) Signed: 01/19/2021 12:27:53 PM By: Fonnie Mu RN Entered By: Fonnie Mu on 01/19/2021 11:48:18 -------------------------------------------------------------------------------- Lower Extremity Assessment Details Patient Name: Date of Service: [name], [name]. 01/19/2021 10:15 A M Medical Record Number: [id number] Patient Account Number: [id number] Date of Birth/Sex: Treating RN: [name] [name] (40 y.o. [name], [name] Primary Care Briggitte Boline: Georganna Skeans Other Clinician: Referring Kameran Mcneese: Treating Aiyanna Awtrey/Extender: Knox Royalty [name] in Treatment: 17 Edema Assessment Assessed: [name]: No] [name]: Yes] Edema: [Left: Ye] [Right: s] Calf Left: Right: Point of Measurement: 36 cm From Medial Instep 63 cm Ankle Left: Right: Point of Measurement: 10 cm From Medial Instep 33.5  cm Knee To Floor Left: Right: From Medial Instep 47 cm Vascular Assessment Pulses: Dorsalis Pedis Palpable: [Right:Yes] Posterior Tibial Palpable: [Right:Yes] Electronic Signature(s) Signed: 01/19/2021 12:27:53 PM By: Fonnie Mu RN Signed: 01/19/2021 12:27:53 PM By: Fonnie Mu RN Entered By: Fonnie Mu on 01/19/2021 10:50:39 -------------------------------------------------------------------------------- Multi Wound Chart Details Patient Name: Date of Service: [name], [name]. 01/19/2021 10:15 A M Medical Record Number: [id number] Patient Account Number: [id number] Date of Birth/Sex: Treating RN: [name] Primary Care Fareedah Mahler: Georganna Skeans Other Clinician: Referring Colin Norment: Treating Quaneshia Wareing/Extender: [name],  [name] in Treatment: 17 Photos: [N/A:N/A] Right, Medial Lower Leg Right, Anterior Lower Leg N/A Wound Location: Gradually Appeared Gradually Appeared N/A Wounding Event: Lymphedema Diabetic Wound/Ulcer of the Lower N/A Primary Etiology: Extremity Lymphedema, Deep Vein Thrombosis, Lymphedema, Deep Vein Thrombosis, N/A Comorbid History: Hypertension, Peripheral Venous Hypertension, Peripheral Venous Disease, Type II Diabetes Disease, Type II Diabetes 07/11/2020 01/02/2021 N/A Date Acquired: 17 2 N/A [name] of Treatment: Open Healed - Epithelialized N/A Wound Status: 0.5x1x0.1 0x0x0 N/A Measurements L x W x D (cm) 0.393 0 N/A A (cm) : rea 0.039 0 N/A Volume (cm) : 99.60% 100.00% N/A % Reduction in Area: 99.90% 100.00% N/A % Reduction in Volume: Full Thickness Without Exposed Grade 2 N/A Classification: Support Structures Large Medium N/A Exudate Amount: Serosanguineous Serosanguineous N/A Exudate Type: red, brown red, brown N/A Exudate Color: Distinct, outline attached Distinct, outline attached N/A Wound Margin: None Present (0%) Large (67-100%) N/A Granulation Amount: N/A Red  N/A Granulation Quality: Large (67-100%) None Present (0%) N/A Necrotic Amount: Fat Layer (Subcutaneous Tissue): Yes Fat Layer (Subcutaneous Tissue): Yes N/A Exposed Structures: Fascia: No Fascia: No Tendon: No Tendon: No Muscle: No Muscle: No Joint: No Joint: No Bone: No Bone: No Medium (34-66%) None N/A Epithelialization: Compression Therapy N/A N/A Procedures Performed: Treatment Notes Wound #32 (Lower Leg) Wound Laterality: Right, Medial Cleanser Soap and Water Discharge Instruction: May shower and wash wound with dial antibacterial soap and water prior to dressing change. Wound Cleanser Discharge Instruction: Cleanse the wound with wound cleanser prior to applying a clean dressing using gauze sponges, not tissue or cotton balls. Peri-Wound Care Triamcinolone 15 (g) Discharge Instruction: Use triamcinolone 15 (g) as directed Sween Lotion (Moisturizing lotion) Discharge Instruction: Apply moisturizing lotion as directed Topical Keystone antibiotic gel Discharge Instruction: Apply directly to wound bed, under primary dressing Primary Dressing Hydrofera Blue Classic Foam, 4x4 in Discharge Instruction: Moisten with saline prior to applying to wound bed Secondary Dressing ABD Pad, 5x9 Discharge Instruction: Apply over primary dressing as directed. Zetuvit Plus 4x8 in Discharge Instruction: Apply over primary dressing as directed. CarboFLEX Odor Control Dressing, 4x4 in Discharge Instruction: Apply over primary dressing as directed. Secured With Compression Wrap CoFlex TLC XL 2-layer Compression System 4x7 (in/yd) Discharge Instruction: Apply CoFlex 2-layer compression as directed. (alt for 4 layer) Compression Stockings Add-Ons Wound #35 (Lower Leg) Wound Laterality: Right, Anterior Cleanser Peri-Wound Care Topical Primary Dressing Secondary Dressing Secured With Compression Wrap Compression Stockings Add-Ons Electronic Signature(s) Signed: 01/19/2021  12:52:34 PM By: Geralyn Corwin DO Entered By: Geralyn Corwin on 01/19/2021 12:47:55 -------------------------------------------------------------------------------- Multi-Disciplinary Care Plan Details Patient Name: Date of Service: [name], [name]. 01/19/2021 10:15 A M Medical Record Number: [id number] Patient Account Number: [id number] Date of Birth/Sex: Treating RN: [birth year], [name] (40 y.o. [name], [name] Primary Care Ramar Nobrega: Georganna Skeans Other Clinician: Referring Goku Harb: Treating Nekoda Chock/Extender: Mackie Pai in Treatment: 17 Active Inactive Wound/Skin Impairment Nursing Diagnoses: Impaired tissue integrity Knowledge deficit related to ulceration/compromised skin integrity Goals: Patient/caregiver will verbalize understanding of skin care regimen Date Initiated: 09/19/2020 Target Resolution Date: 02/02/2021 Goal Status: Active Interventions: Assess patient/caregiver ability to obtain necessary supplies Assess patient/caregiver ability to perform ulcer/skin care regimen upon admission and as needed Assess ulceration(s) every visit Notes: Electronic Signature(s) Signed: 01/19/2021 12:27:53 PM By: Fonnie Mu RN Entered By: Fonnie Mu on 01/19/2021 11:09:23 -------------------------------------------------------------------------------- Pain Assessment Details Patient Name: Date of Service: [name], [name]. 01/19/2021 10:15 A M Medical Record Number: [id number] Patient Account Number: [id number] Date  of Birth/Sex: Treating RN: [birth year], [name] (40 y.o. [name], [name] Primary Care Zeriyah Wain: Georganna Skeans Other Clinician: Referring Ranette Luckadoo: Treating Murl Zogg/Extender: Mackie Pai in Treatment: 17 Active Problems Location of Pain Severity and Description of Pain Patient Has Paino No Site Locations Pain Management and Medication Current Pain Management: Electronic Signature(s) Signed:  01/19/2021 12:27:53 PM By: Fonnie Mu RN Entered By: Fonnie Mu on 01/19/2021 10:35:30 -------------------------------------------------------------------------------- Patient/Caregiver Education Details Patient Name: Date of Service: [name] 11/11/2022andnbsp10:15 A M Medical Record Number: [id number] Patient Account Number: [id number] Date of Birth/Gender: Treating RN: [name], [name] (40 y.o. [name] Primary Care Physician: Georganna Skeans Other Clinician: Referring Physician: Treating Physician/Extender: Mackie Pai in Treatment: 17 Education Assessment Education Provided To: Patient Education Topics Provided Wound/Skin Impairment: Methods: Explain/Verbal Responses: Reinforcements needed, State content correctly Electronic Signature(s) Signed: 01/19/2021 12:27:53 PM By: Fonnie Mu RN Entered By: Fonnie Mu on 01/19/2021 11:09:45 -------------------------------------------------------------------------------- Wound Assessment Details Patient Name: Date of Service: [name]. 01/19/2021 10:15 A M Medical Record Number: [id number] Patient Account Number: [id number] Date of Birth/Sex: Treating RN: [name]/[birth year][name] (40 y.o. [name], [name] Primary Care Akeisha Lagerquist: Georganna Skeans Other Clinician: Referring Xiomar Crompton: Treating Dejanique Ruehl/Extender: Knox Royalty [name] in Treatment: 17 Wound Status Wound Number: 32 Primary Lymphedema Etiology: Wound Location: Right, Medial Lower Leg Wound Open Wounding Event: Gradually Appeared Status: Date Acquired: 07/11/2020 Comorbid Lymphedema, Deep Vein Thrombosis, Hypertension, Peripheral [name] Of Treatment: 17 History: Venous Disease, Type II Diabetes Clustered Wound: No Photos Wound Measurements Length: (cm) 0.5 Width: (cm) 1 Depth: (cm) 0.1 Area: (cm) 0.393 Volume: (cm) 0.039 % Reduction in Area: 99.6% % Reduction in Volume:  99.9% Epithelialization: Medium (34-66%) Tunneling: No Undermining: No Wound Description Classification: Full Thickness Without Exposed Support Structures Wound Margin: Distinct, outline attached Exudate Amount: Large Exudate Type: Serosanguineous Exudate Color: red, brown Foul Odor After Cleansing: No Slough/Fibrino Yes Wound Bed Granulation Amount: None Present (0%) Exposed Structure Necrotic Amount: Large (67-100%) Fascia Exposed: No Necrotic Quality: Adherent Slough Fat Layer (Subcutaneous Tissue) Exposed: Yes Tendon Exposed: No Muscle Exposed: No Joint Exposed: No Bone Exposed: No Treatment Notes Wound #32 (Lower Leg) Wound Laterality: Right, Medial Cleanser Soap and Water Discharge Instruction: May shower and wash wound with dial antibacterial soap and water prior to dressing change. Wound Cleanser Discharge Instruction: Cleanse the wound with wound cleanser prior to applying a clean dressing using gauze sponges, not tissue or cotton balls. Peri-Wound Care Triamcinolone 15 (g) Discharge Instruction: Use triamcinolone 15 (g) as directed Sween Lotion (Moisturizing lotion) Discharge Instruction: Apply moisturizing lotion as directed Topical Keystone antibiotic gel Discharge Instruction: Apply directly to wound bed, under primary dressing Primary Dressing Hydrofera Blue Classic Foam, 4x4 in Discharge Instruction: Moisten with saline prior to applying to wound bed Secondary Dressing ABD Pad, 5x9 Discharge Instruction: Apply over primary dressing as directed. Zetuvit Plus 4x8 in Discharge Instruction: Apply over primary dressing as directed. CarboFLEX Odor Control Dressing, 4x4 in Discharge Instruction: Apply over primary dressing as directed. Secured With Compression Wrap CoFlex TLC XL 2-layer Compression System 4x7 (in/yd) Discharge Instruction: Apply CoFlex 2-layer compression as directed. (alt for 4 layer) Compression Stockings Add-Ons Electronic  Signature(s) Signed: 01/19/2021 12:27:53 PM By: Fonnie Mu RN Signed: 01/23/2021 1:54:26 PM By: Karl Ito Entered By: Karl Ito on 01/19/2021 10:53:27 -------------------------------------------------------------------------------- Wound Assessment Details Patient Name: Date of Service: [name], [name]. 01/19/2021 10:15 A M Medical Record Number: [id number] Patient Account Number: [id number] Date of  Birth/Sex: Treating RN: [birth year] (40 y.o. [name], [name] Primary Care Kaliyah Gladman: Georganna Skeans Other Clinician: Referring Crimson Beer: Treating Kamyla Olejnik/Extender: Knox Royalty [name] in Treatment: 17 Wound Status Wound Number: 35 Primary Diabetic Wound/Ulcer of the Lower Extremity Etiology: Wound Location: Right, Anterior Lower Leg Wound Healed - Epithelialized Wounding Event: Gradually Appeared Status: Date Acquired: 01/02/2021 Comorbid Lymphedema, Deep Vein Thrombosis, Hypertension, Peripheral [name] Of Treatment: 2 History: Venous Disease, Type II Diabetes Clustered Wound: No Photos Wound Measurements Length: (cm) 0 Width: (cm) 0 Depth: (cm) 0 Area: (cm) 0 Volume: (cm) 0 % Reduction in Area: 100% % Reduction in Volume: 100% Epithelialization: None Wound Description Classification: Grade 2 Wound Margin: Distinct, outline attached Exudate Amount: Medium Exudate Type: Serosanguineous Exudate Color: red, brown Foul Odor After Cleansing: No Slough/Fibrino No Wound Bed Granulation Amount: Large (67-100%) Exposed Structure Granulation Quality: Red Fascia Exposed: No Necrotic Amount: None Present (0%) Fat Layer (Subcutaneous Tissue) Exposed: Yes Tendon Exposed: No Muscle Exposed: No Joint Exposed: No Bone Exposed: No Treatment Notes Wound #35 (Lower Leg) Wound Laterality: Right, Anterior Cleanser Peri-Wound Care Topical Primary Dressing Secondary Dressing Secured With Compression Wrap Compression  Stockings Add-Ons Electronic Signature(s) Signed: 01/19/2021 12:27:53 PM By: Fonnie Mu RN Signed: 01/23/2021 1:54:26 PM By: Karl Ito Entered By: Karl Ito on 01/19/2021 10:54:23 -------------------------------------------------------------------------------- Vitals Details Patient Name: Date of Service: [name], A DRIA N [name]. 01/19/2021 10:15 A M Medical Record Number: [id number] Patient Account Number: [id number] Date of Birth/Sex: Treating RN: [birth year][name] (40 y.o. [name], [name] Primary Care Jahred Tatar: Georganna Skeans Other Clinician: Referring Kariem Wolfson: Treating Demetric Parslow/Extender: Knox Royalty [name] in Treatment: 17 Vital Signs Time Taken: 10:35 Reference Range: 80 - 120 mg / dl Height (in): 74 Weight (lbs): 425 Body Mass Index (BMI): 54.6 Electronic Signature(s) Signed: 01/19/2021 12:27:53 PM By: Fonnie Mu RN Entered By: Fonnie Mu on 01/19/2021 10:35:22

## 2021-01-24 ENCOUNTER — Ambulatory Visit: Payer: 59 | Admitting: Vascular Surgery

## 2021-01-24 ENCOUNTER — Encounter (HOSPITAL_COMMUNITY): Payer: 59

## 2021-01-29 ENCOUNTER — Ambulatory Visit: Payer: 59 | Admitting: Family Medicine

## 2021-02-06 ENCOUNTER — Encounter (HOSPITAL_BASED_OUTPATIENT_CLINIC_OR_DEPARTMENT_OTHER): Payer: 59 | Admitting: Internal Medicine

## 2021-03-12 ENCOUNTER — Other Ambulatory Visit: Payer: Self-pay | Admitting: Internal Medicine

## 2021-03-12 DIAGNOSIS — I10 Essential (primary) hypertension: Secondary | ICD-10-CM

## 2021-03-15 NOTE — Progress Notes (Signed)
Virtual Visit via Video Note   This visit type was conducted due to national recommendations for restrictions regarding the COVID-19 Pandemic (e.g. social distancing) in an effort to limit this patient's exposure and mitigate transmission in our community.  Due to his co-morbid illnesses, this patient is at least at moderate risk for complications without adequate follow up.  This format is felt to be most appropriate for this patient at this time.  All issues noted in this document were discussed and addressed.  A limited physical exam was performed with this format.  Please refer to the patient's chart for his consent to telehealth for [facility].     The patient was identified using 2 identifiers.  Date:  03/17/2021   ID:  [name], DOB 19-Sep-1980, MRN [id number]  Patient Location: Home Provider Location: Office/Clinic  PCP:  Georganna SkeansWilson, Amelia, MD  Cardiologist:  Chilton Siiffany Goodman, MD  Electrophysiologist:  None   Evaluation Performed:  Follow-Up Visit  Chief Complaint:  Follow-up  History of Present Illness: [name] is a 41 y.o. male with chronic heart failure type unknown, hypertension, hyperlipidemia, diabetes, morbid obesity, OSA, and venous insufficiency who presents via video for follow-up. [name] was admitted 1/10 through 1/26 2018 for acute heart failure exacerbation. Echocardiogram was not interpretable due to body habitus. He was diuresed with IV Lasix with a negative output of 47.2 L. His weight decreased from 631 pounds on admission to 488 pounds at discharge. His creatinine remained stable.  He followed up with Randall AnBrittany Strader on 04/12/16 and was doing well. His weight was 498 pounds. His blood pressure was poorly-controlled. It was noted that carvedilol had been left off his medication list at discharge. This was restarted at that appointment. He followed up with Randall AnBrittany Strader and was doing well. He was exercising and working out with a Systems analystpersonal trainer 3 times  per week. on 07/2016.  His blood pressure was slightly elevated at that appointment, but he had come straight from the gym, so no changes were made.   [name] was feeling well and his weight was up in the setting of going on a cruise and not taking his spironolactone. His medication was resumed. He was also referred to wound care for a non-healing leg ulcer. He saw Gillian Shieldsaitlin Walker, NP 10/2020. Prior to that he was treated with metolazone and increased lasix for volume overload. He had recently gone on a cruise and felt palpitations while away. He struggled with motion sickness while on his trip. He was reminded of fluid and sodium restrictions.   At his last appointment, he reported at home blood pressures ranging from 138-145 systolic. Metolazone was causing him to feel ill so he stopped taking it 11/2020. He also noted low urine production. Carvedilol was increased, and lisinopril was switched to Entresto. Metolazone was changed to PRN. Lasix and spironolactone were continued.   Today, he is feeling pretty well overall. At home his blood pressure has been well-controlled with readings such as 121/88 and 120/81. His main complaint today is he has been suffering from sharp, right LE pain. This has been intermittent, without associated edema or muscle cramps. However, it feels like his leg locks up at random while sitting or walking. He states his pain is also related to issues with the region of his right Achilles' tendon, and in the area of his right LE with ulcers that are almost healed. He has one ulcer about the size of a nickel. While walking, at  times he suddenly feels like he can't put his foot down due to pressure/pain. He is taking gabapentin nightly, not sure if this has been helping. He reports trying to lose weight, but it fluctuates. His physical activity is significantly limited by his Achilles' and LE pain. Of note, he has new insurance and [name] is too expensive. Lately his breathing is pretty  good. His PCP wants him to have another sleep study. Currently he is on room air at night. He denies any palpitations, chest pain, lightheadedness, headaches, syncope, orthopnea, or PND.  The patient does not have symptoms concerning for COVID-19 infection (fever, chills, cough, or new shortness of breath).   Past Medical History:  Diagnosis Date   Chronic venous insufficiency    a. as a premature infant, required venous access for care with subsequent DVT and some sort of procedure -> eventually went on to have vein bypass around 2005.   Hypertension    Lymphedema    Morbid obesity (HCC)    OSA on CPAP     Past Surgical History:  Procedure Laterality Date   balloon stenting Right    right leg   VEIN LIGATION AND STRIPPING       Current Outpatient Medications  Medication Sig Dispense Refill   carvedilol (COREG) 25 MG tablet Take 1 tablet (25 mg total) by mouth 2 (two) times daily. 60 tablet 5   gabapentin (NEURONTIN) 300 MG capsule Take 1 capsule (300 mg total) by mouth at bedtime. 90 capsule 0   metFORMIN (GLUCOPHAGE) 500 MG tablet Take 1 tablet (500 mg total) by mouth daily with breakfast. 90 tablet 0   metolazone (ZAROXOLYN) 2.5 MG tablet Take 2.5 mg by mouth as needed. AS NEEDED FOR WEIGHT GAIN OF 5 POUNDS     potassium chloride SA (KLOR-CON) 20 MEQ tablet Take 2 tablets (40 mEq total) by mouth 2 (two) times daily. 360 tablet 0   sacubitril-valsartan (ENTRESTO) 49-51 MG Take 1 tablet by mouth 2 (two) times daily. 180 tablet 3   spironolactone (ALDACTONE) 25 MG tablet TAKE 1 TABLET BY MOUTH EVERY DAY 90 tablet 0   Accu-Chek Softclix Lancets lancets Use as instructed 100 each 12   furosemide (LASIX) 80 MG tablet Take 1 tablet (80 mg total) by mouth 2 (two) times daily. 180 tablet 2   glucose blood (ACCU-CHEK AVIVA PLUS) test strip Use as instructed 100 each 12   Misc. Devices (SCD SOFT SLEEVES/KNEE LENGTH) MISC 2 each by Does not apply route as needed. 2 each 0   PARoxetine  (PAXIL) 20 MG tablet Take 1 tablet (20 mg total) by mouth daily. (Patient not taking: Reported on 03/16/2021) 30 tablet 0   traMADol (ULTRAM) 50 MG tablet Take 50 mg by mouth every 6 (six) hours as needed for moderate pain. (Patient not taking: Reported on 03/16/2021)     No current facility-administered medications for this visit.    Allergies:   Lidocaine and Peanut-containing drug products    Social History:  The patient  reports that he has never smoked. He has never used smokeless tobacco. He reports current alcohol use of about 4.0 standard drinks per week. He reports that he does not use drugs.   Family History:  The patient's family history includes Cancer in his father, maternal grandfather, and maternal grandmother; Diabetes in his mother.    ROS:   Please see the history of present illness. (+) Sharp, right LE pain (+) Ulcers, right LE All other systems are reviewed and  negative.    PHYSICAL EXAM:  BP 121/88    Ht 6\' 2"  (1.88 m)    Wt (!) 478 lb (216.8 kg)    BMI 61.37 kg/m  GENERAL: Well-appearing.  No acute distress. HEENT: Pupils equal round.  Oral mucosa unremarkable NECK:  No jugular venous distention, no visible thyromegaly EXT:  No edema, no cyanosis no clubbing SKIN:  No rashes no nodules NEURO:  Speech fluent.  Cranial nerves grossly intact.  Moves all 4 extremities freely PSYCH:  Cognitively intact, oriented to person place and time   EKG: 03/16/2021: EKG was not ordered. 01/05/2021: EKG was not ordered. 05/04/2018: EKG was not ordered. 10/31/2017: Sinus rhythm.  Rate 85 bpm.  Prior anteroseptal infarct.  Low voltage. 11/05/16: Sinus rhythm.  Rate 72 bpm.  Low voltage.  03/13/16: Sinus rhythm.  Rate 80 bpm.  Nonspecific T wave abnormalities.  LE Venous Reflux 11/02/2020: Summary:  Right:  - No evidence of obvious acute deep vein thrombosis seen in the right  lower extremity, from the common femoral through the popliteal veins.  - No evidence of superficial  venous reflux seen in the right greater  saphenous vein, from the mid to distal thigh to mid calf area. Post  ablation / stripping in the thigh area.  - No evidence of superficial venous reflux seen in the right short  saphenous vein, limited visualization.  - Venous reflux is noted in the right common femoral vein.  - No compression and no flow in varicosities at the medial knee suggestive  of a history of superficial thrombophlebitis  - Enlarged lymph node noted in groin   NOTE: This was a technically difficult and limited exam   Echo 03/21/2016: Impressions:  - Uninterpretable study, even after Definity contrast    administration.   Recent Labs: 10/30/2020: ALT 17; Hemoglobin 14.7; Magnesium 2.1; Platelets 245; TSH 2.190 01/05/2021: BUN 12; Creatinine, Ser 0.95; Potassium 4.4; Sodium 141    Lipid Panel    Component Value Date/Time   CHOL 174 06/23/2019 1750   TRIG 122 06/23/2019 1750   HDL 41 06/23/2019 1750   CHOLHDL 4.2 06/23/2019 1750   LDLCALC 111 (H) 06/23/2019 1750      Wt Readings from Last 3 Encounters:  03/16/21 (!) 478 lb (216.8 kg)  01/05/21 (!) 501 lb (227.3 kg)  01/03/21 (!) 491 lb (222.7 kg)      ASSESSMENT AND PLAN:  NSVT (nonsustained ventricular tachycardia) (HCC) He has not had new palpitations lately.  Continue carvedilol.  OSA (obstructive sleep apnea) He does not currently have a BiPAP and is awaiting a repeat sleep study.  Chronic congestive heart failure (HCC) Likely chronic diastolic heart failure.  His weight is down and his volume status is stable.  He has been having some leg discomfort that may be due to electrolyte imbalance.  He never picked up the [facility] due to cost.  He now has insurance.  He is going to hold his lisinopril today and tomorrow.  He will start [facility] tomorrow evening.  I told him about the $10 co-pay card which should significantly reduce his cost.  He has been using metolazone very rarely.  He notes that when he  took it it caused him to urinate too much.  Continue carvedilol, Lasix, spironolactone, and only use metolazone if his volume status increases quickly.   COVID-19 Education: The signs and symptoms of COVID-19 were discussed with the patient and how to seek care for testing (follow up with PCP or arrange  E-visit).  The importance of social distancing was discussed today.  Time:   Today, I have spent 22 minutes with the patient with telehealth technology discussing the above problems.     Current medicines are reviewed at length with the patient today.  The patient does not have concerns regarding medicines.  The following changes have been made:  no change  Labs/ tests ordered today include:   Orders Placed This Encounter  Procedures   Basic metabolic panel   Magnesium   Disposition:    FU with APP in 2 months. FU with Caniyah Murley C. Duke Salviaandolph, MD, [facility] in 6 months.   I,Mathew Stumpf,acting as a Neurosurgeonscribe for Chilton Siiffany [place], MD.,have documented all relevant documentation on the behalf of Chilton Siiffany Puckett, MD,as directed by  Chilton Siiffany Holliday, MD while in the presence of Chilton Siiffany , MD.  I, Juanelle Trueheart C. Duke Salviaandolph, MD have reviewed all documentation for this visit.  The documentation of the exam, diagnosis, procedures, and orders on 03/17/2021 are all accurate and complete.   Signed, Jamire Shabazz C. Duke Salviaandolph, MD, [facility]  03/17/2021 8:10 AM    [facility]

## 2021-03-16 ENCOUNTER — Encounter (HOSPITAL_BASED_OUTPATIENT_CLINIC_OR_DEPARTMENT_OTHER): Payer: Self-pay | Admitting: Cardiovascular Disease

## 2021-03-16 ENCOUNTER — Telehealth (INDEPENDENT_AMBULATORY_CARE_PROVIDER_SITE_OTHER): Payer: 59 | Admitting: Cardiovascular Disease

## 2021-03-16 VITALS — BP 121/88 | Ht 74.0 in | Wt >= 6400 oz

## 2021-03-16 DIAGNOSIS — I4729 Other ventricular tachycardia: Secondary | ICD-10-CM

## 2021-03-16 DIAGNOSIS — G4733 Obstructive sleep apnea (adult) (pediatric): Secondary | ICD-10-CM

## 2021-03-16 DIAGNOSIS — I10 Essential (primary) hypertension: Secondary | ICD-10-CM

## 2021-03-16 DIAGNOSIS — I5042 Chronic combined systolic (congestive) and diastolic (congestive) heart failure: Secondary | ICD-10-CM

## 2021-03-16 DIAGNOSIS — I509 Heart failure, unspecified: Secondary | ICD-10-CM

## 2021-03-16 DIAGNOSIS — Z5181 Encounter for therapeutic drug level monitoring: Secondary | ICD-10-CM

## 2021-03-16 MED ORDER — SACUBITRIL-VALSARTAN 49-51 MG PO TABS: 1.0000 | ORAL_TABLET | Freq: Two times a day (BID) | ORAL | 3 refills | Status: DC

## 2021-03-16 MED ORDER — FUROSEMIDE 80 MG PO TABS: 80.0000 mg | ORAL_TABLET | Freq: Two times a day (BID) | ORAL | 2 refills | Status: DC

## 2021-03-16 NOTE — Assessment & Plan Note (Signed)
Likely chronic diastolic heart failure.  His weight is down and his volume status is stable.  He has been having some leg discomfort that may be due to electrolyte imbalance.  He never picked up the [facility] due to cost.  He now has insurance.  He is going to hold his lisinopril today and tomorrow.  He will start [facility] tomorrow evening.  I told him about the $10 co-pay card which should significantly reduce his cost.  He has been using metolazone very rarely.  He notes that when he took it it caused him to urinate too much.  Continue carvedilol, Lasix, spironolactone, and only use metolazone if his volume status increases quickly.

## 2021-03-16 NOTE — Assessment & Plan Note (Signed)
He has not had new palpitations lately.  Continue carvedilol.

## 2021-03-16 NOTE — Patient Instructions (Signed)
Medication Instructions:  START ENTRESTO 49-51 MG TWICE A DAY STARTING TOMORROW EVENING   STOP LISINOPRIL STARTING TODAY   *If you need a refill on your cardiac medications before your next appointment, please call your pharmacy*  Lab Work: BMET/MAGNESIUM 1 WEEK AFTER STARTING ENTRESTO   If you have labs (blood work) drawn today and your tests are completely normal, you will receive your results only by: [place] (if you have MyChart) OR A paper copy in the mail If you have any lab test that is abnormal or we need to change your treatment, we will call you to review the results.  Testing/Procedures: NONE  Follow-Up: At [facility], you and your health needs are our priority.  As part of our continuing mission to provide you with exceptional heart care, we have created designated Provider Care Teams.  These Care Teams include your primary Cardiologist (physician) and Advanced Practice Providers (APPs -  Physician Assistants and Nurse Practitioners) who all work together to provide you with the care you need, when you need it.  We recommend signing up for the patient portal called "MyChart".  Sign up information is provided on this After Visit Summary.  MyChart is used to connect with patients for Virtual Visits (Telemedicine).  Patients are able to view lab/test results, encounter notes, upcoming appointments, etc.  Non-urgent messages can be sent to your provider as well.   To learn more about what you can do with MyChart, go to [email or web address].    Your next appointment:   05/15/2021 8:00 WITH Miquel Dunn NP }

## 2021-03-16 NOTE — Assessment & Plan Note (Signed)
He does not currently have a BiPAP and is awaiting a repeat sleep study.

## 2021-03-17 ENCOUNTER — Encounter (HOSPITAL_BASED_OUTPATIENT_CLINIC_OR_DEPARTMENT_OTHER): Payer: Self-pay | Admitting: Cardiovascular Disease

## 2021-03-17 NOTE — Assessment & Plan Note (Signed)
BP is controlled on his current regimen.  He never started Rosita.  We will start Entresto.  Continue carvedilol and spironolactone.

## 2021-05-15 ENCOUNTER — Ambulatory Visit (HOSPITAL_BASED_OUTPATIENT_CLINIC_OR_DEPARTMENT_OTHER): Payer: Self-pay | Admitting: Family

## 2021-05-17 ENCOUNTER — Telehealth: Payer: Self-pay | Admitting: Family Medicine

## 2021-05-17 NOTE — Telephone Encounter (Signed)
Patient called requesting a referral for a sleep study. He had one several years ago and currently has a CPAP machine, but is needing a new one. In order to do that he needs a current sleep study. Please advise if patient needs an OV before the referral can be done. ?

## 2021-05-24 ENCOUNTER — Ambulatory Visit (INDEPENDENT_AMBULATORY_CARE_PROVIDER_SITE_OTHER): Payer: Self-pay | Admitting: Family

## 2021-05-24 ENCOUNTER — Other Ambulatory Visit: Payer: Self-pay

## 2021-05-24 ENCOUNTER — Encounter (HOSPITAL_BASED_OUTPATIENT_CLINIC_OR_DEPARTMENT_OTHER): Payer: Self-pay | Admitting: Family

## 2021-05-24 VITALS — Ht 74.0 in | Wt >= 6400 oz

## 2021-05-24 DIAGNOSIS — I4729 Other ventricular tachycardia: Secondary | ICD-10-CM

## 2021-05-24 DIAGNOSIS — I10 Essential (primary) hypertension: Secondary | ICD-10-CM

## 2021-05-24 DIAGNOSIS — G4733 Obstructive sleep apnea (adult) (pediatric): Secondary | ICD-10-CM

## 2021-05-24 DIAGNOSIS — I509 Heart failure, unspecified: Secondary | ICD-10-CM

## 2021-05-24 DIAGNOSIS — I872 Venous insufficiency (chronic) (peripheral): Secondary | ICD-10-CM

## 2021-05-24 MED ORDER — SPIRONOLACTONE 25 MG PO TABS: 25.0000 mg | ORAL_TABLET | Freq: Every day | ORAL | 1 refills | Status: DC

## 2021-05-24 NOTE — Progress Notes (Signed)
? ?Virtual Visit via Telephone Note  ? ?This visit type was conducted due to national recommendations for restrictions regarding the COVID-19 Pandemic (e.g. social distancing) in an effort to limit this patient's exposure and mitigate transmission in our community.  Due to his co-morbid illnesses, this patient is at least at moderate risk for complications without adequate follow up.  This format is felt to be most appropriate for this patient at this time.  The patient did not have access to video technology/had technical difficulties with video requiring transitioning to audio format only (telephone).  All issues noted in this document were discussed and addressed.  No physical exam could be performed with this format.  Please refer to the patient's chart for his  consent to telehealth for [facility].  ? ? ?Date:  05/24/2021  ? ?ID:  [name], DOB Dec 25, 1980, MRN [id number] ?The patient was identified using 2 identifiers. ? ?Patient Location: Home ?Provider Location: Office/Clinic ? ? ?PCP:  Dorna Mai, MD ?  ?[facility] Providers ?Cardiologist:  Skeet Latch, MD    ? ?Evaluation Performed:  Follow-Up Visit ? ?Chief Complaint:  Follow up heart failure ? ?History of Present Illness:   ? ?[name] is a 41 y.o. male with hx of chronic heart failure, hypertension, hyperlipidemia, diabetes, morbid obesity, OSA, venous insufficiency last seen 03/16/21 via video visit by Dr. Oval Linsey.  ?  ?He was admitted 03/20/16-04/05/16 for acute heart failure. Echocardiogram was not interpretable due to body habitus. He was diuresed with IV Lasix with negative output of 47.2 L. H e was discharged from 631 pounds on admission to 488 pounds on discharge. His creatnine remained stable.  ?  ?He was last seen 05/04/18 by Dr. Oval Linsey. He was going to the gym 3-4 days per week. He was wearing Haematologist. He was recommended for follow up in 2 months which was not completed. ?  ?Seen 10/06/20 with worsening LE edema,  dyspnea on exertion. Metolazone 2.45m one tablet thirty minutes prior to Furosemide was started on Saturdays and Tuesdays. Seen in follow up 10/30/20 with myriad of concerns including LE edema with LRE in Unna boot, hand numbness, palpitations. TSH, magnesium, CMP ordered - due to worsening renal function Metolazone was discontinued. At clinic visit 12/2020 [facility] 1/6/23BP well controlled at home though noted sharp RLE pain feeling like leg "locked up". Due to new insurance EDelene Lollwas cost prohibitive but was not using coupon and encouraged to resume with coupon. He was awaiting repeat sleep study.  ?  ?Follow up via phone visit today. Reports home weight have been stable at home. Has not been using Metolazone - reminded to use for weight gain of 2 lbs overnight or 5 lbs in one week. SBP at home 129-138. Shares with me that he wore a CPAP a number of years ago but has not worn in >5 years and hopeful to resume. Interested in sleep study. Reports no shortness of breath nor dyspnea on exertion. Reports no chest pain, pressure, or tightness. No orthopnea, PND. Reports no palpitations.   ? ?The patient does not have symptoms concerning for COVID-19 infection (fever, chills, cough, or new shortness of breath).  ? ? ?Past Medical History:  ?Diagnosis Date  ? Chronic venous insufficiency   ? a. as a premature infant, required venous access for care with subsequent DVT and some sort of procedure -> eventually went on to have vein bypass around 2005.  ? Hypertension   ?  Lymphedema   ? Morbid obesity ([place])   ? OSA on CPAP   ? ?Past Surgical History:  ?Procedure Laterality Date  ? balloon stenting Right   ? right leg  ? VEIN LIGATION AND STRIPPING    ?  ?Current Meds  ?Medication Sig  ? carvedilol (COREG) 25 MG tablet Take 1 tablet (25 mg total) by mouth 2 (two) times daily.  ? furosemide (LASIX) 80 MG tablet Take 1 tablet (80 mg total) by mouth 2 (two) times daily.  ?  gabapentin (NEURONTIN) 300 MG capsule Take 1 capsule (300 mg total) by mouth at bedtime.  ? meloxicam (MOBIC) 15 MG tablet Take 1 tablet by mouth as needed.  ? metFORMIN (GLUCOPHAGE) 500 MG tablet Take 1 tablet (500 mg total) by mouth daily with breakfast.  ? sacubitril-valsartan (ENTRESTO) 49-51 MG Take 1 tablet by mouth 2 (two) times daily.  ? traMADol (ULTRAM) 50 MG tablet Take 50 mg by mouth every 6 (six) hours as needed for moderate pain.  ? [DISCONTINUED] potassium chloride SA (KLOR-CON) 20 MEQ tablet Take 2 tablets (40 mEq total) by mouth 2 (two) times daily.  ? [DISCONTINUED] spironolactone (ALDACTONE) 25 MG tablet TAKE 1 TABLET BY MOUTH EVERY DAY  ?  ?Allergies:   Lidocaine and Peanut-containing drug products  ? ?Social History  ? ?Tobacco Use  ? Smoking status: Never  ? Smokeless tobacco: Never  ?Substance Use Topics  ? Alcohol use: Yes  ?  Alcohol/week: 4.0 standard drinks  ?  Types: 4 Standard drinks or equivalent per week  ?  Comment: liquor - pt states once a week  ? Drug use: No  ?  ? ?Family Hx: ?The patient's family history includes Cancer in his father, maternal grandfather, and maternal grandmother; Diabetes in his mother. ? ?ROS:   ?Please see the history of present illness.    ?All other systems reviewed and are negative. ? ?Prior CV studies:   ?The following studies were reviewed today: ? ?LE Venous Reflux 11/02/2020: ?Summary:  ?Right:  ?- No evidence of obvious acute deep vein thrombosis seen in the right  ?lower extremity, from the common femoral through the popliteal veins.  ?- No evidence of superficial venous reflux seen in the right greater  ?saphenous vein, from the mid to distal thigh to mid calf area. Post  ?ablation / stripping in the thigh area.  ?- No evidence of superficial venous reflux seen in the right short  ?saphenous vein, limited visualization.  ?- Venous reflux is noted in the right common femoral vein.  ?- No compression and no flow in varicosities at the medial knee  suggestive  ?of a history of superficial thrombophlebitis  ?- Enlarged lymph node noted in groin  ? ?NOTE: This was a technically difficult and limited exam  ?  ?Echo 03/21/2016: ?Impressions:  ?- Uninterpretable study, even after Definity contrast  ?  administration.  ? ?Labs/Other Tests and Data Reviewed:   ? ?EKG:  No ECG reviewed. ? ?Recent Labs: ?10/30/2020: ALT 17; Hemoglobin 14.7; Magnesium 2.1; Platelets 245; TSH 2.190 ?01/05/2021: BUN 12; Creatinine, Ser 0.95; Potassium 4.4; Sodium 141  ? ?Recent Lipid Panel ?Lab Results  ?Component Value Date/Time  ? CHOL 174 06/23/2019 05:50 PM  ? TRIG 122 06/23/2019 05:50 PM  ? HDL 41 06/23/2019 05:50 PM  ? CHOLHDL 4.2 06/23/2019 05:50 PM  ? LDLCALC 111 (H) 06/23/2019 05:50 PM  ? ? ?Wt Readings from Last 3 Encounters:  ?05/24/21 (!) 498 lb (225.9 kg)  ?03/16/21 [name])  478 lb (216.8 kg)  ?01/05/21 (!) 501 lb (227.3 kg)  ? STOP-Bang Score:  6      ?Objective:   ? ?Vital Signs:  Ht 6' 2"  (1.88 m)   Wt (!) 498 lb (225.9 kg)   BMI 63.94 kg/m?   ? ?VITAL SIGNS:  reviewed ? ?ASSESSMENT & PLAN:   ? ?CHF - Reports home weight stable. Due for BMP, magnesium as previously recommended - I have asked him to have his labs done and mailed his lab slips again. GDMT includes Spironolactone, Lasix, Entresto, Metolazone, Coreg. Refill of Spironolactone provided. Low sodium diet, fluid restriction <2L, and daily weights encouraged. Educated to contact our office for weight gain of 2 lbs overnight or 5 lbs in one week.  ? ?NSVT - No recurrence. Continue current dose Carvedilol. ? ?HTN - BP reasonably well controlled at home. Continue current antihypertensive regimen.  ? ?OSA - STOP Bang score 6. Previously on CPAP but not in >5 years. Home sleep study ordered.  ? ?Obesity - Weight loss via diet and exercise encouraged. Discussed the impact being overweight would have on cardiovascular risk.  ? ?Time:   ?Today, I have spent 11 minutes with the patient with telehealth technology discussing the  above problems.   ? ? ?Medication Adjustments/Labs and Tests Ordered: ?Current medicines are reviewed at length with the patient today.  Concerns regarding medicines are outlined above.  ? ?Tests Ordered: ?Orders Place

## 2021-05-24 NOTE — Patient Instructions (Addendum)
Medication Instructions:  ?Continue your current medications.  ?[facility] ? ?*If you need a refill on your cardiac medications before your next appointment, please call your pharmacy* ? ? ?Lab Work: ?Please have lab work done in 1-2 weeks for BMP, magnesium. ? ?Please return for Lab work. You may come to the...  ? ?[facility] (3rd floor) ?[address] [place], [place] [address]  ?Open: 8am-Noon and 1pm-4:30pm  ? ?[facility] ?[address]  ? ?[employer]- Any location ? ?**no appointments needed**  ? ?If you have labs (blood work) drawn today and your tests are completely normal, you will receive your results only by: ?MyChart Message (if you have MyChart) OR ?A paper copy in the mail ?If you have any lab test that is abnormal or we need to change your treatment, we will call you to review the results. ? ? ?Testing/Procedures: ?Your physician has recommended that you have a sleep study. This test records several body functions during sleep, including: brain activity, eye movement, oxygen and carbon dioxide blood levels, heart rate and rhythm, breathing rate and rhythm, the flow of air through your mouth and nose, snoring, body muscle movements, and chest and belly movement.  ? ?Follow-Up: ?At [facility], you and your health needs are our priority.  As part of our continuing mission to provide you with exceptional heart care, we have created designated Provider Care Teams.  These Care Teams include your primary Cardiologist (physician) and Advanced Practice Providers (APPs -  Physician Assistants and Nurse Practitioners) who all work together to provide you with the care you need, when you need it. ? ?We recommend signing up for the patient portal called "MyChart".  Sign up information is provided on this After Visit Summary.  MyChart is used to connect with patients for Virtual Visits (Telemedicine).  Patients  are able to view lab/test results, encounter notes, upcoming appointments, etc.  Non-urgent messages can be sent to your provider as well.   ?To learn more about what you can do with MyChart, go to [email or web address].   ? ?Your next appointment:   ?July 17th at 4:20 PM with Dr. Duke Salvia at [facility] at [place].  ? ?Other Instructions ? ?Heart Healthy Diet Recommendations: ?A low-salt diet is recommended. Meats should be grilled, baked, or boiled. Avoid fried foods. Focus on lean protein sources like fish or chicken with vegetables and fruits. The American Heart Association is a Chief Technology Officer!  American Heart Association Diet and Lifeystyle Recommendations   ? ?When eating out to follow a low salt diet: ?Choose smaller portions ?Choose grilled instead of fried ?Ask for sauces and dressings on the side ? ?Exercise recommendations: ?The American Heart Association recommends 150 minutes of moderate intensity exercise weekly. ?Try 30 minutes of moderate intensity exercise 4-5 times per week. ?This could include walking, jogging, or swimming. ? ?Recommend weighing daily and keeping a log. Please take your as-needed [place] for weight gain of 2 pounds overnight or 5 pounds in 1 week.  ? ?Date ? Time Weight  ? ?    ? ?    ? ?    ? ?    ? ?    ? ?    ? ?    ? ?    ? ?  ?

## 2021-07-02 ENCOUNTER — Ambulatory Visit (HOSPITAL_BASED_OUTPATIENT_CLINIC_OR_DEPARTMENT_OTHER): Payer: BC Managed Care – PPO | Attending: Family | Admitting: Cardiovascular Disease

## 2021-07-02 DIAGNOSIS — G4736 Sleep related hypoventilation in conditions classified elsewhere: Secondary | ICD-10-CM | POA: Diagnosis not present

## 2021-07-02 DIAGNOSIS — G4733 Obstructive sleep apnea (adult) (pediatric): Secondary | ICD-10-CM | POA: Insufficient documentation

## 2021-07-10 ENCOUNTER — Encounter (HOSPITAL_BASED_OUTPATIENT_CLINIC_OR_DEPARTMENT_OTHER): Payer: Self-pay | Admitting: Cardiovascular Disease

## 2021-07-10 NOTE — Procedures (Signed)
? ? ? ?  Patient Name: [name], [name] ?Study Date: 07/02/2021 ?Gender: Male ?D.O.B: 16-Aug-1980 ?Age (years): 25 ?Referring Provider: Loel Dubonnet NP ?Height (inches): 74 ?Interpreting Physician: Shelva Majestic MD, ABSM ?Weight (lbs): 490 ?RPSGT: Jacolyn Reedy ?BMI: 63 ?MRN: [id number] ?Neck Size: 20.00 ? ?CLINICAL INFORMATION ?Sleep Study Type: HST ? ?Indication for sleep study: Snoring, morbid obesity, h/o OSA not on CPAP for > 5 years, hypertension. ? ?Epworth Sleepiness Score: 7 ? ?SLEEP STUDY TECHNIQUE ?A multi-channel overnight portable sleep study was performed. The channels recorded were: nasal airflow, thoracic respiratory movement, and oxygen saturation with a pulse oximetry. Snoring was also monitored. ? ?MEDICATIONS ?carvedilol (COREG) 25 MG tablet (Expired) ?furosemide (LASIX) 80 MG tablet (Expired) ?gabapentin (NEURONTIN) 300 MG capsule (Expired) ?glucose blood (ACCU-CHEK AVIVA PLUS) test strip ?meloxicam (MOBIC) 15 MG tablet ?metFORMIN (GLUCOPHAGE) 500 MG tablet ?metolazone (ZAROXOLYN) 2.5 MG tablet ?Misc. Devices (SCD SOFT SLEEVES/KNEE LENGTH) MISC ?sacubitril-valsartan (ENTRESTO) 49-51 MG ?spironolactone (ALDACTONE) 25 MG tablet ?traMADol (ULTRAM) 50 MG tablet  ?Patient self administered medications include: N/A. ? ?SLEEP ARCHITECTURE ?Patient was studied for 368 minutes. The sleep efficiency was 100.0 % and the patient was supine for 37.7%. The arousal index was 0.0 per hour. ? ?RESPIRATORY PARAMETERS ?The overall AHI was 56.1 per hour, with a central apnea index of 0 per hour. ? ?The oxygen nadir was 80% during sleep. ? ?CARDIAC DATA ?Mean heart rate during sleep was 91.3 bpm. Maximal HR 113 bpm. ? ?IMPRESSIONS ?- Severe obstructive sleep apnea occurred during this study (AH 56.1/h). There is a significant positional component with supine sleep AHI 86.1/h versus non-supine sleep AHI 37.9/h. ?- Severe oxygen desaturation to a nadir of 80%. ?- Patient snored for 171.8 minutes (46.7%) during the  sleep. ? ?DIAGNOSIS ?- Obstructive Sleep Apnea (G47.33) ?- Nocturnal Hypoxemia (G47.36) ?- Snoring ? ?RECOMMENDATIONS ?- Therapeutic CPAP for treatment of hs severe sleep disordered breathing. If unable to obtain an in-lab titration, initiate Auto-PAP with EPR of 3 at 8 - 20 cm of water.  ?- Effort should be made to optimize nasal and oropharyngeal patency. ?- Positional therapy avoiding supine position during sleep. ?- Avoid alcohol, sedatives and other CNS depressants that may worsen sleep apnea and disrupt normal sleep architecture. ?- Sleep hygiene should be reviewed to assess factors that may improve sleep quality. ?- Weight management (BMI 63) and regular exercise should be initiated or continued. ?- Recommend a downloads and sleep clinic evaluation after one month of therapy.  ? ? ?[Electronically signed] 07/10/2021 01:48 PM ? ?Shelva Majestic MD, [facility] ABSM ?Diplomate, Tax adviser of Sleep Medicine ? ?NPI: [id number] ? ?[place] ?PH: [phone]   FX: [phone] ?ACCREDITED BY THE AMERICAN ACADEMY OF SLEEP MEDICINE ? ?

## 2021-07-11 ENCOUNTER — Other Ambulatory Visit: Payer: Self-pay | Admitting: Cardiovascular Disease

## 2021-07-11 ENCOUNTER — Telehealth: Payer: Self-pay | Admitting: *Deleted

## 2021-07-11 DIAGNOSIS — G4736 Sleep related hypoventilation in conditions classified elsewhere: Secondary | ICD-10-CM

## 2021-07-11 DIAGNOSIS — I509 Heart failure, unspecified: Secondary | ICD-10-CM

## 2021-07-11 DIAGNOSIS — G4733 Obstructive sleep apnea (adult) (pediatric): Secondary | ICD-10-CM

## 2021-07-11 DIAGNOSIS — I10 Essential (primary) hypertension: Secondary | ICD-10-CM

## 2021-07-11 NOTE — Telephone Encounter (Signed)
-----   Message from Troy Sine, MD sent at 07/10/2021  1:53 PM EDT ----- ?Mariann Laster, please notify pt and will need CPAP titration study or Auto-PAP initiation ?

## 2021-07-11 NOTE — Telephone Encounter (Signed)
Patient notified of HST results. Need to restart CPAP treatment. He agrees to proceed. PA sent to [facility] for titration sleep study. ?

## 2021-07-13 ENCOUNTER — Telehealth: Payer: Self-pay | Admitting: *Deleted

## 2021-07-13 NOTE — Telephone Encounter (Signed)
Prior Authorization for CPAP titration sent to [facility] via Fax @ [phone]. ?

## 2021-07-17 ENCOUNTER — Other Ambulatory Visit (HOSPITAL_BASED_OUTPATIENT_CLINIC_OR_DEPARTMENT_OTHER): Payer: Self-pay | Admitting: Cardiovascular Disease

## 2021-07-17 DIAGNOSIS — I10 Essential (primary) hypertension: Secondary | ICD-10-CM

## 2021-07-17 DIAGNOSIS — I5042 Chronic combined systolic (congestive) and diastolic (congestive) heart failure: Secondary | ICD-10-CM

## 2021-07-17 NOTE — Telephone Encounter (Signed)
Rx(s) sent to pharmacy electronically.  

## 2021-08-07 ENCOUNTER — Telehealth: Payer: Self-pay | Admitting: *Deleted

## 2021-08-07 NOTE — Telephone Encounter (Signed)
Called [facility] to check on CPAP titration PA status. Was informed of approval. Auth # [id number]. Valid dates 07/19/21 to 10/23/21.

## 2021-08-07 NOTE — Telephone Encounter (Signed)
Call reference # [id number].

## 2021-08-07 NOTE — Telephone Encounter (Signed)
Patient notified of CPAP titration appointment details. 

## 2021-08-30 ENCOUNTER — Ambulatory Visit: Payer: Self-pay

## 2021-08-30 ENCOUNTER — Encounter (HOSPITAL_COMMUNITY): Payer: Self-pay

## 2021-08-30 ENCOUNTER — Ambulatory Visit (HOSPITAL_COMMUNITY)
Admission: RE | Admit: 2021-08-30 | Discharge: 2021-08-30 | Discharge status: Absent without leave | Payer: Self-pay | Source: Ambulatory Visit | Attending: Family Medicine | Admitting: Family Medicine

## 2021-08-30 ENCOUNTER — Ambulatory Visit (HOSPITAL_COMMUNITY)
Admission: EM | Admit: 2021-08-30 | Discharge: 2021-08-30 | Disposition: A | Discharge status: Home / Self Care | Payer: Self-pay | Attending: Family Medicine | Admitting: Family Medicine

## 2021-08-30 ENCOUNTER — Other Ambulatory Visit: Payer: Self-pay

## 2021-08-30 ENCOUNTER — Telehealth: Payer: Self-pay | Admitting: Cardiovascular Disease

## 2021-08-30 DIAGNOSIS — L97919 Non-pressure chronic ulcer of unspecified part of right lower leg with unspecified severity: Secondary | ICD-10-CM

## 2021-08-30 DIAGNOSIS — I5042 Chronic combined systolic (congestive) and diastolic (congestive) heart failure: Secondary | ICD-10-CM

## 2021-08-30 DIAGNOSIS — T783XXA Angioneurotic edema, initial encounter: Secondary | ICD-10-CM

## 2021-08-30 DIAGNOSIS — I10 Essential (primary) hypertension: Secondary | ICD-10-CM

## 2021-08-30 MED ORDER — HYDRALAZINE HCL 25 MG PO TABS: 25.0000 mg | ORAL_TABLET | Freq: Two times a day (BID) | ORAL | 3 refills | Status: DC

## 2021-08-30 MED ORDER — DOXYCYCLINE HYCLATE 100 MG PO CAPS: 100.0000 mg | ORAL_CAPSULE | Freq: Two times a day (BID) | ORAL | 0 refills | Status: DC

## 2021-08-30 MED ORDER — PREDNISONE 20 MG PO TABS: 40.0000 mg | ORAL_TABLET | Freq: Every day | ORAL | 0 refills | Status: DC

## 2021-08-30 NOTE — ED Triage Notes (Signed)
Pt reports using Aveeno body lotion around his face this morning. Pt reports 10-15 min after lips started to swell and tongue is numb. Pt took 100 mg of benadryl.

## 2021-08-30 NOTE — ED Triage Notes (Signed)
Pt would like to discuss his wound on his right leg. The ulcer has been there for several years.

## 2021-08-30 NOTE — Telephone Encounter (Signed)
He's overdue for most labs. Would recommend CBC, CMP, fasting lipid panel prior to his next appointment. That way we can review at his appointment. DC both Lisinopril and Entresto - not supposed to be taking together. Okay to use TOC or provider hold slot on my schedule for follow up next week. Maybe Thursday or Friday so we make sure labs are done?  Alver Sorrow, NP

## 2021-08-30 NOTE — Telephone Encounter (Signed)
Pt requesting refills of expired medications: Gabapentin and Lasix. [facility] to [facility].

## 2021-08-30 NOTE — Telephone Encounter (Signed)
Requested medications are due for refill today.  unsure  Requested medications are on the active medications list.  yes  Last refill. Lasix 03/16/2021 #180 2 refills - too soon to refill. Gabapentin 12/13/2020 #90 0 refills  Future visit scheduled.   no  Notes to clinic.  Both rx's are expired.    Requested Prescriptions  Pending Prescriptions Disp Refills   gabapentin (NEURONTIN) 300 MG capsule 90 capsule 0    Sig: Take 1 capsule (300 mg total) by mouth at bedtime.     Neurology: Anticonvulsants - gabapentin Passed - 08/30/2021  1:58 PM      Passed - Cr in normal range and within 360 days    Creat  Date Value Ref Range Status  04/12/2016 1.01 0.60 - 1.35 mg/dL Final   Creatinine, Ser  Date Value Ref Range Status  01/05/2021 0.95 0.76 - 1.27 mg/dL Final         Passed - Completed PHQ-2 or PHQ-9 in the last 360 days      Passed - Valid encounter within last 12 months    Recent Outpatient Visits           7 months ago Uncontrolled hypertension   Primary Care at [facility], Lauris Poag, MD   8 months ago Atrial fibrillation, unspecified type [facility])   Primary Care at [facility], MD   10 months ago OSA on CPAP   Primary Care at [facility], Amy J, NP   11 months ago Essential hypertension   Primary Care at [facility] [place], NP   1 year ago Annual physical exam   Primary Care at [facility], Kandee Keen, MD       Future Appointments             In 3 weeks Chilton Si, MD [facility]             furosemide (LASIX) 80 MG tablet 180 tablet 2    Sig: Take 1 tablet (80 mg total) by mouth 2 (two) times daily.     Cardiovascular:  Diuretics - Loop Failed - 08/30/2021  1:58 PM      Failed - K in normal range and within 180 days    Potassium  Date Value Ref Range Status  01/05/2021 4.4 3.5 - 5.2 mmol/L Final         Failed - Ca in normal range and within 180 days     Calcium  Date Value Ref Range Status  01/05/2021 9.1 8.7 - 10.2 mg/dL Final         Failed - Na in normal range and within 180 days    Sodium  Date Value Ref Range Status  01/05/2021 141 134 - 144 mmol/L Final         Failed - Cr in normal range and within 180 days    Creat  Date Value Ref Range Status  04/12/2016 1.01 0.60 - 1.35 mg/dL Final   Creatinine, Ser  Date Value Ref Range Status  01/05/2021 0.95 0.76 - 1.27 mg/dL Final         Failed - Cl in normal range and within 180 days    Chloride  Date Value Ref Range Status  01/05/2021 101 96 - 106 mmol/L Final         Failed - Mg Level in normal range and within 180 days    Magnesium  Date Value Ref Range Status  10/30/2020  2.1 1.6 - 2.3 mg/dL Final         Failed - Valid encounter within last 6 months    Recent Outpatient Visits           7 months ago Uncontrolled hypertension   Primary Care at [facility], Lauris Poag, MD   8 months ago Atrial fibrillation, unspecified type [facility])   Primary Care at [facility], MD   10 months ago OSA on CPAP   Primary Care at [facility], Amy J, NP   11 months ago Essential hypertension   Primary Care at [facility], [place], NP   1 year ago Annual physical exam   Primary Care at [facility], Kandee Keen, MD       Future Appointments             In 3 weeks Chilton Si, MD [facility]            Passed - Last BP in normal range    BP Readings from Last 1 Encounters:  08/30/21 (!) 171/107

## 2021-08-30 NOTE — Discharge Instructions (Signed)
I recommend you stop taking Entresto until you can speak with your cardiologist and until you lip swelling resolves.. Your lip swelling should resolve in the next 24-72 hours. If you feel this is worsening or if you have any breathing/swallowing difficulties please proceed directly to the Emergency Department.  Call your doctor tomorrow to inform him or her of your symptoms and to ask about alternative medications to treat your blood pressure.

## 2021-08-30 NOTE — Telephone Encounter (Signed)
Discontinue Entresto until upcoming appt with Dr. Duke Salvia in July. Start Hydralazine 25mg  BID for BP coverage.  Contact our office if BP consistently >130/80.   , NP

## 2021-08-30 NOTE — Telephone Encounter (Signed)
Spoke with patient and reviewed recommendations  Patient stated he was taking both Entresto and Lisinopril  Advised to stop both  Will forward to Ronn Melena NP for review

## 2021-08-30 NOTE — Telephone Encounter (Signed)
Pt c/o medication issue:  1. Name of Medication:   sacubitril-valsartan (ENTRESTO) 49-51 MG    2. How are you currently taking this medication (dosage and times per day)?  Take 1 tablet by mouth 2 (two) times daily 3. Are you having a reaction (difficulty breathing--STAT)?  No  4. What is your medication issue? Pt states that he had to go to Urgent Care today due to tongue swelling and face numb. Pt states that Urgent Care says it's coming from mediation. Pt would like a callback as soon as possible. Please advise

## 2021-08-30 NOTE — ED Provider Notes (Signed)
[facility]   [id number] 08/30/21 Arrival Time: 1432  ASSESSMENT & PLAN:  1. Angioedema, initial encounter   2. Leg ulcer, right, with unspecified severity (HCC)    No resp/swallowing difficulties. Will stop ARB until cardiology f/u. Cannot r/o allergic rxn to lotion. Short course of prednisone. Doxy to cover any staph infection of chronic RLE wound/ulcer.  Orders Placed This Encounter  Procedures   Apply [employer]    Standing Status:   Standing    Number of Occurrences:   1    Order Specific Question:   Laterality    Answer:   Right    Order Specific Question:   Does the patient have a wound?    Answer:   Yes    Comments:   If yes, add separate topical wound care order for nursing    Order Specific Question:   Is there a separate topical wound care order for nursing?    Answer:   No   Recommend:  Follow-up Information     Schedule an appointment as soon as possible for a visit  [facility]   Specialty: Wound Care Contact information: [address] [place], [address] [id number] [place] [address] [phone]        Schedule an appointment as soon as possible for a visit  with Chilton Si, MD.   Specialty: Cardiology Contact information: [address] [place] [address] [phone]                Begin: Meds ordered this encounter  Medications   doxycycline (VIBRAMYCIN) 100 MG capsule    Sig: Take 1 capsule (100 mg total) by mouth 2 (two) times daily.    Dispense:  20 capsule    Refill:  0   predniSONE (DELTASONE) 20 MG tablet    Sig: Take 2 tablets (40 mg total) by mouth daily.    Dispense:  10 tablet    Refill:  0     Discharge Instructions      I recommend you stop taking Entresto until you can speak with your cardiologist and until you lip swelling resolves.. Your lip swelling should resolve in the next 24-72 hours. If you feel this is worsening or if you have any  breathing/swallowing difficulties please proceed directly to the Emergency Department.  Call your doctor tomorrow to inform him or her of your symptoms and to ask about alternative medications to treat your blood pressure.     Reviewed expectations re: course of current medical issues. Questions answered. Outlined signs and symptoms indicating need for more acute intervention. Patient verbalized understanding. After Visit Summary given.   SUBJECTIVE:  [name] is a 41 y.o. male who reports lower lip swelling; abrupt onset; today. Noted after using Aveeno lotion around face lips. No swallowing/resp difficulties. No h/o similar. No new medications.  Also reports chronic RLE wound/ulcer. Has seen wound care in past. No drainage or bleeding. "Just not healing". Last wound care visit sev months ago.  OBJECTIVE: Vitals:   08/30/21 1449  BP: (!) 171/107  Pulse: 75  Resp: 18  Temp: 98.7 F (37.1 C)  TempSrc: Oral  SpO2: 100%    Elevated BP noted.  General appearance: alert; no distress HEENT: ; AT Neck: supple with FROM Lungs: clear to auscultation bilaterally Heart: regular rate and rhythm Extremities: no edema; moves all extremities normally Skin: warm and dry; RLE with chronic ulcerating wound; no drainage/bleeding; with odor; skin with venous  stasis changes and lymphedema Psychological: alert and cooperative; normal mood and affect  Allergies  Allergen Reactions   Lidocaine Other (See Comments)    "burns my skin"   Peanut-Containing Drug Products Swelling    Tree nuts    Past Medical History:  Diagnosis Date   Chronic venous insufficiency    a. as a premature infant, required venous access for care with subsequent DVT and some sort of procedure -> eventually went on to have vein bypass around 2005.   Hypertension    Lymphedema    Morbid obesity (HCC)    OSA on CPAP    Social History   Socioeconomic History   Marital status: Single    Spouse name: Not on  file   Number of children: Not on file   Years of education: Not on file   Highest education level: Not on file  Occupational History   Not on file  Tobacco Use   Smoking status: Never   Smokeless tobacco: Never  Substance and Sexual Activity   Alcohol use: Yes    Alcohol/week: 4.0 standard drinks of alcohol    Types: 4 Standard drinks or equivalent per week    Comment: liquor - pt states once a week   Drug use: No   Sexual activity: Not on file  Other Topics Concern   Not on file  Social History Narrative   Not on file   Social Determinants of Health   Financial Resource Strain: Not on file  Food Insecurity: Not on file  Transportation Needs: Not on file  Physical Activity: Not on file  Stress: Not on file  Social Connections: Not on file  Intimate Partner Violence: Not on file   Family History  Problem Relation Age of Onset   Diabetes Mother    Cancer Father        colon   Cancer Maternal Grandmother    Cancer Maternal Grandfather    Past Surgical History:  Procedure Laterality Date   balloon stenting Right    right leg   VEIN LIGATION AND Trisha Mangle, MD 08/30/21 1521

## 2021-08-30 NOTE — Telephone Encounter (Signed)
Summary: advice - swollen lip   Pt called in wanted to be seen today, no open appts, pt stated his bottom lip seems to be swelling, please advise.      Chief Complaint: swollen lower lip, lower lip numbness and tip of tongue Symptoms: see above Frequency: this am Pertinent Negatives: Patient denies difficulty breathing or swallowing, rash Disposition: [] ED /[x] Urgent Care (no appt availability in office) / [] Appointment(In office/virtual)/ []  [place] [facility]/ [] Home Care/ [] Refused Recommended Disposition /[] [place] [facility]/ []  Follow-up with PCP Additional Notes: pt to go UC , pt has already taken 2 doses 50 mg Benadryl (0730 and 1234). Advised if worsened to call 911 if breathing difficulty or tongue swelling.  Reason for Disposition  Lip swelling is a chronic symptom (recurrent or ongoing AND present > 4 weeks)  Answer Assessment - Initial Assessment Questions 1. ONSET: "When did the swelling start?" (e.g., minutes, hours, days)     This am 2. SEVERITY: "How swollen is it?"     Bottom lip very swollen 3. ITCHING: "Is there any itching?" If Yes, ask: "How much?"   (Scale 1-10; mild, moderate or severe)     no 4. PAIN: "Is the swelling painful to touch?" If Yes, ask: "How painful is it?"   (Scale 1-10; mild, moderate or severe)     no 5. CAUSE: "What do you think is causing the lip swelling?"     unknown 6. RECURRENT SYMPTOM: "Have you had lip swelling before?" If Yes, ask: "When was the last time?" "What happened that time?"     Nut allergy that caused -on airplane-went to UC- benadryl and prednisone 7. OTHER SYMPTOMS: "Do you have any other symptoms?" (e.g., toothache)     Numbness-to lower lip and spreading tip of tongue  Protocols used: Lip Swelling-A-AH

## 2021-09-02 ENCOUNTER — Ambulatory Visit (HOSPITAL_BASED_OUTPATIENT_CLINIC_OR_DEPARTMENT_OTHER): Payer: Self-pay | Attending: Cardiovascular Disease | Admitting: Cardiovascular Disease

## 2021-09-02 ENCOUNTER — Other Ambulatory Visit: Payer: Self-pay

## 2021-09-02 DIAGNOSIS — I10 Essential (primary) hypertension: Secondary | ICD-10-CM

## 2021-09-02 DIAGNOSIS — G4736 Sleep related hypoventilation in conditions classified elsewhere: Secondary | ICD-10-CM

## 2021-09-02 DIAGNOSIS — I509 Heart failure, unspecified: Secondary | ICD-10-CM

## 2021-09-02 DIAGNOSIS — G4733 Obstructive sleep apnea (adult) (pediatric): Secondary | ICD-10-CM

## 2021-09-07 ENCOUNTER — Encounter (HOSPITAL_BASED_OUTPATIENT_CLINIC_OR_DEPARTMENT_OTHER): Payer: Self-pay | Attending: General Surgery | Admitting: General Surgery

## 2021-09-07 DIAGNOSIS — E11621 Type 2 diabetes mellitus with foot ulcer: Secondary | ICD-10-CM | POA: Insufficient documentation

## 2021-09-07 DIAGNOSIS — Z6841 Body Mass Index (BMI) 40.0 and over, adult: Secondary | ICD-10-CM | POA: Insufficient documentation

## 2021-09-07 DIAGNOSIS — I110 Hypertensive heart disease with heart failure: Secondary | ICD-10-CM | POA: Insufficient documentation

## 2021-09-07 DIAGNOSIS — E11622 Type 2 diabetes mellitus with other skin ulcer: Secondary | ICD-10-CM | POA: Insufficient documentation

## 2021-09-07 DIAGNOSIS — L97812 Non-pressure chronic ulcer of other part of right lower leg with fat layer exposed: Secondary | ICD-10-CM | POA: Insufficient documentation

## 2021-09-07 DIAGNOSIS — Z86718 Personal history of other venous thrombosis and embolism: Secondary | ICD-10-CM | POA: Insufficient documentation

## 2021-09-07 DIAGNOSIS — I890 Lymphedema, not elsewhere classified: Secondary | ICD-10-CM | POA: Insufficient documentation

## 2021-09-07 DIAGNOSIS — I509 Heart failure, unspecified: Secondary | ICD-10-CM | POA: Insufficient documentation

## 2021-09-07 NOTE — Progress Notes (Signed)
[name] ([id number]) . Visit Report for 09/07/2021 Allergy List Details Patient Name: Date of Service: [name]. 09/07/2021 8:00 A M Medical Record Number: [id number] Patient Account Number: [id number] Date of Birth/Sex: Treating RN: [name] (41 y.o. [name] Shawn Care Hermela Hardt: Georganna Skeans Other Clinician: Referring Kanani Mowbray: Treating Yarely Bebee/Extender: Bertrum Sol Weeks in Treatment: 0 Allergies Active Allergies peanut Type: Food lidocaine Reaction: burns Allergy Notes Electronic Signature(s) Signed: 09/07/2021 5:39:10 PM By: Karie Schwalbe RN Entered By: Karie Schwalbe on 09/07/2021 [name]:31:49 -------------------------------------------------------------------------------- Arrival Information Details Patient Name: Date of Service: [name], Shawn Oppenheim [name]. 09/07/2021 8:00 A M Medical Record Number: [id number] Patient Account Number: [id number] Date of Birth/Sex: Treating RN: [name] (41 y.o. [name] Shawn Care Laine Giovanetti: Georganna Skeans Other Clinician: Referring Liviya Santini: Treating Anedra Penafiel/Extender: Kathreen Cosier in Treatment: 0 Visit Information Patient Arrived: Ambulatory Arrival Time: [name]:20 Accompanied By: self Transfer Assistance: None History Since Last Visit Added or deleted any medications: No Any new allergies or adverse reactions: No Had a fall or experienced change in activities of daily living that Shawn affect risk of falls: No Signs or symptoms of abuse/neglect since last visito No Hospitalized since last visit: No Implantable device outside of the clinic excluding cellular tissue based products placed in the center since last visit: No Electronic Signature(s) Signed: 09/07/2021 5:39:10 PM By: Karie Schwalbe RN Entered By: Karie Schwalbe on 09/07/2021 [name]:27:10 -------------------------------------------------------------------------------- Clinic Level of Care Assessment  Details Patient Name: Date of Service: [name], Shawn Oppenheim [name]. 09/07/2021 8:00 A M Medical Record Number: [id number] Patient Account Number: [id number] Date of Birth/Sex: Treating RN: [name] (41 y.o. [name] Shawn Care Astha Probasco: Georganna Skeans Other Clinician: Referring Keedan Sample: Treating Azaylea Maves/Extender: Kathreen Cosier in Treatment: 0 Clinic Level of Care Assessment Items TOOL 1 Quantity Score X- 1 0 Use when EandM and Procedure is performed on INITIAL visit ASSESSMENTS - Nursing Assessment / Reassessment X- 1 20 General Physical Exam (combine w/ comprehensive assessment (listed just below) when performed on new pt. evals) X- 1 25 Comprehensive Assessment (HX, ROS, Risk Assessments, Wounds Hx, etc.) ASSESSMENTS - Wound and Skin Assessment / Reassessment X- 1 10 Dermatologic / Skin Assessment (not related to wound area) ASSESSMENTS - Ostomy and/or Continence Assessment and Care []  - 0 Incontinence Assessment and Management []  - 0 Ostomy Care Assessment and Management (repouching, etc.) PROCESS - Coordination of Care X - Simple Patient / Family Education for ongoing care 1 15 []  - 0 Complex (extensive) Patient / Family Education for ongoing care X- 1 10 Staff obtains , Records, [name] Results / Process Orders est X- 1 10 Staff telephones HHA, Nursing Homes / Clarify orders / etc []  - 0 Routine Transfer to another Facility (non-emergent condition) []  - 0 Routine Hospital Admission (non-emergent condition) X- 1 15 New Admissions / / Ordering NPWT Apligraf, etc. , []  - 0 Emergency Hospital Admission (emergent condition) PROCESS - Special Needs []  - 0 Pediatric / Minor Patient Management []  - 0 Isolation Patient Management []  - 0 Hearing / Language / Visual special needs []  - 0 Assessment of Community assistance (transportation, D/C planning, etc.) []  - 0 Additional assistance / Altered mentation []  -  0 Support Surface(s) Assessment (bed, cushion, seat, etc.) INTERVENTIONS - Miscellaneous []  - 0 External ear exam []  - 0 Patient Transfer (multiple staff / / Similar devices) []  - 0 Simple Staple / Suture removal (25 or  less) []  - 0 Complex Staple / Suture removal (26 or more) []  - 0 Hypo/Hyperglycemic Management (do not check if billed separately) []  - 0 Ankle / Brachial Index (ABI) - do not check if billed separately Has the patient been seen at the hospital within the last three years: Yes Total Score: 105 Level Of Care: New/Established - Level 3 Electronic Signature(s) Signed: 09/07/2021 5:39:10 PM By: Karie SchwalbeScotton, Joanne RN Signed: 09/07/2021 5:39:10 PM By: Karie SchwalbeScotton, Joanne RN Entered By: Karie SchwalbeScotton, Joanne on 09/07/2021 17:24:53 -------------------------------------------------------------------------------- Compression Therapy Details Patient Name: Date of Service: [name], Shawn OppenheimA DRIA N [name]. 09/07/2021 8:00 A M Medical Record Number: [id number] Patient Account Number: [id number] Date of Birth/Sex: Treating RN: [name], Shawn (41 y.o. [name]) Scotton, Joanne Shawn Care Albi Rappaport: Georganna SkeansWilson, Amelia Other Clinician: Referring Lylliana Kitamura: Treating Jolea Dolle/Extender: Kathreen Cosierannon, Jennifer Wilson, Amelia Weeks in Treatment: 0 Compression Therapy Performed for Wound Assessment: Wound #36 Right,Posterior Lower Leg Performed By: Clinician Karie SchwalbeScotton, Joanne, RN Compression Type: Four Layer Post Procedure Diagnosis Same as Pre-procedure Electronic Signature(s) Signed: 09/07/2021 5:39:10 PM By: Karie SchwalbeScotton, Joanne RN Entered By: Karie SchwalbeScotton, Joanne on 09/07/2021 09:12:38 -------------------------------------------------------------------------------- Compression Therapy Details Patient Name: Date of Service: [name], Shawn OppenheimA DRIA N [name]. 09/07/2021 8:00 A M Medical Record Number: [id number] Patient Account Number: [id number] Date of Birth/Sex: Treating RN: [name], Shawn (41 y.o. [name]) Scotton, Joanne Shawn Care Dnyla Antonetti:  Georganna SkeansWilson, Amelia Other Clinician: Referring Kyden Potash: Treating Brookley Spitler/Extender: Kathreen Cosierannon, Jennifer Wilson, Amelia Weeks in Treatment: 0 Compression Therapy Performed for Wound Assessment: Wound #37 Right,Medial Lower Leg Performed By: Clinician Karie SchwalbeScotton, Joanne, RN Compression Type: Four Layer Post Procedure Diagnosis Same as Pre-procedure Electronic Signature(s) Signed: 09/07/2021 5:39:10 PM By: Karie SchwalbeScotton, Joanne RN Entered By: Karie SchwalbeScotton, Joanne on 09/07/2021 09:12:38 -------------------------------------------------------------------------------- Compression Therapy Details Patient Name: Date of Service: [name], Shawn OppenheimA DRIA N [name]. 09/07/2021 8:00 A M Medical Record Number: [id number] Patient Account Number: [id number] Date of Birth/Sex: Treating RN: [name], Shawn (41 y.o. [name]) Scotton, Joanne Shawn Care Francisca Langenderfer: Georganna SkeansWilson, Amelia Other Clinician: Referring Vallie Fayette: Treating Corinthia Helmers/Extender: Kathreen Cosierannon, Jennifer Wilson, Amelia Weeks in Treatment: 0 Compression Therapy Performed for Wound Assessment: Wound #38 Right,Anterior Lower Leg Performed By: Clinician Karie SchwalbeScotton, Joanne, RN Compression Type: Four Layer Post Procedure Diagnosis Same as Pre-procedure Electronic Signature(s) Signed: 09/07/2021 5:39:10 PM By: Karie SchwalbeScotton, Joanne RN Entered By: Karie SchwalbeScotton, Joanne on 09/07/2021 09:12:38 -------------------------------------------------------------------------------- Compression Therapy Details Patient Name: Date of Service: [name], Shawn OppenheimA DRIA N [name]. 09/07/2021 8:00 A M Medical Record Number: [id number] Patient Account Number: [id number] Date of Birth/Sex: Treating RN: [name], Shawn (41 y.o. [name]) Scotton, Joanne Shawn Care Nikolette Reindl: Georganna SkeansWilson, Amelia Other Clinician: Referring Ellarose Brandi: Treating Maja Mccaffery/Extender: Kathreen Cosierannon, Jennifer Wilson, Amelia Weeks in Treatment: 0 Compression Therapy Performed for Wound Assessment: Wound #39 Right,Lateral Lower Leg Performed By: Clinician Karie SchwalbeScotton, Joanne, RN Compression Type:  Four Layer Post Procedure Diagnosis Same as Pre-procedure Electronic Signature(s) Signed: 09/07/2021 5:39:10 PM By: Karie SchwalbeScotton, Joanne RN Entered By: Karie SchwalbeScotton, Joanne on 09/07/2021 09:12:38 -------------------------------------------------------------------------------- Encounter Discharge Information Details Patient Name: Date of Service: [name], Shawn OppenheimA DRIA N [name]. 09/07/2021 8:00 A M Medical Record Number: [id number] Patient Account Number: [id number] Date of Birth/Sex: Treating RN: [name], Shawn (41 y.o. [name]) Scotton, Joanne Shawn Care Eilidh Marcano: Georganna SkeansWilson, Amelia Other Clinician: Referring Amyiah Gaba: Treating Hazem Kenner/Extender: Kathreen Cosierannon, Jennifer Wilson, Amelia Weeks in Treatment: 0 Encounter Discharge Information Items Post Procedure Vitals Discharge Condition: Stable Temperature (F): 98.5 Ambulatory Status: Ambulatory Pulse (bpm): 86 Discharge Destination: Home Respiratory Rate (breaths/min): 20 Transportation: Private Auto Blood Pressure (mmHg): 151/91 Accompanied By: self Schedule Follow-up Appointment: Yes Clinical Summary of Care: Patient Declined Electronic Signature(s) Signed:  09/07/2021 5:39:10 PM By: Karie Schwalbe RN Entered By: Karie Schwalbe on 09/07/2021 17:38:11 -------------------------------------------------------------------------------- Lower Extremity Assessment Details Patient Name: Date of Service: [name], Shawn Oppenheim [name]. 09/07/2021 8:00 A M Medical Record Number: [id number] Patient Account Number: [id number] Date of Birth/Sex: Treating RN: [name], [name] Shawn Care Axyl Sitzman: Georganna Skeans Other Clinician: Referring Jordynn Perrier: Treating Gabriell Casimir/Extender: Bertrum Sol Weeks in Treatment: 0 Edema Assessment Assessed: Kyra Searles: Yes] [Right: No] Edema: [Left: Ye] [Right: s] Calf Left: Right: Point of Measurement: 32 cm From Medial Instep 61 cm Ankle Left: Right: Point of Measurement: 11 cm From Medial Instep 32.5 cm Knee To  Floor Left: Right: From Medial Instep 44 cm Electronic Signature(s) Signed: 09/07/2021 5:39:10 PM By: Karie Schwalbe RN Entered By: Karie Schwalbe on 09/07/2021 [name]:37:52 -------------------------------------------------------------------------------- Multi Wound Chart Details Patient Name: Date of Service: [name], Shawn Oppenheim [name]. 09/07/2021 8:00 A M Medical Record Number: [id number] Patient Account Number: [id number] Date of Birth/Sex: Treating RN: [birth year] (41 y.o. [name] Shawn Care Althea Backs: Georganna Skeans Other Clinician: Referring Deon Duer: Treating Abby Tucholski/Extender: Kathreen Cosier in Treatment: 0 Vital Signs Height(in): 74 Pulse(bpm): 86 Weight(lbs): 450 Blood Pressure(mmHg): 151/91 Body Mass Index(BMI): 57.8 Temperature(F): 98.5 Respiratory Rate(breaths/min): 20 Photos: Right, Posterior Lower Leg Right, Medial Lower Leg Right, Anterior Lower Leg Wound Location: Gradually Appeared Gradually Appeared Gradually Appeared Wounding Event: Venous Leg Ulcer Venous Leg Ulcer Venous Leg Ulcer Shawn Etiology: Lymphedema, Deep Vein Thrombosis, Lymphedema, Deep Vein Thrombosis, Lymphedema, Deep Vein Thrombosis, Comorbid History: Hypertension, Peripheral Venous Hypertension, Peripheral Venous Hypertension, Peripheral Venous Disease, Type II Diabetes Disease, Type II Diabetes Disease, Type II Diabetes 03/12/2021 03/12/2021 03/12/2021 Date Acquired: 0 0 0 Weeks of Treatment: Open Open Open Wound Status: No No No Wound Recurrence: No Yes No Clustered Wound: 5x4.2x0.6 4x2.5x0.3 1.1x1.1x0.2 Measurements L x W x D (cm) 16.493 7.854 0.95 A (cm) : rea 9.896 2.356 0.19 Volume (cm) : 0.00% 0.00% 0.00% % Reduction in Area: 0.00% 0.00% 0.00% % Reduction in Volume: Full Thickness Without Exposed Full Thickness Without Exposed Full Thickness Without Exposed Classification: Support Structures Support Structures Support Structures Medium Medium  Medium Exudate A mount: Serosanguineous Serosanguineous Serosanguineous Exudate Type: red, brown red, brown red, brown Exudate Color: Distinct, outline attached Distinct, outline attached Distinct, outline attached Wound Margin: Medium (34-66%) Medium (34-66%) Small (1-33%) Granulation Amount: Pink Red Red Granulation Quality: Medium (34-66%) Medium (34-66%) Large (67-100%) Necrotic Amount: Adherent Slough Adherent [employer] Necrotic Tissue: Fat Layer (Subcutaneous Tissue): Yes Fat Layer (Subcutaneous Tissue): Yes Fat Layer (Subcutaneous Tissue): Yes Exposed Structures: Fascia: No Fascia: No Fascia: No Tendon: No Tendon: No Tendon: No Muscle: No Muscle: No Muscle: No Joint: No Joint: No Joint: No Bone: No Bone: No Bone: No Small (1-33%) Small (1-33%) Small (1-33%) Epithelialization: Wound Number: [id number] N/A Photos: No Photos N/A Right, Lateral Lower Leg Right, Lateral Lower Leg N/A Wound Location: Gradually Appeared Gradually Appeared N/A Wounding Event: Venous Leg Ulcer Venous Leg Ulcer N/A Shawn Etiology: Lymphedema, Deep Vein Thrombosis, Lymphedema, Deep Vein Thrombosis, N/A Comorbid History: Hypertension, Peripheral Venous Hypertension, Peripheral Venous Disease, Type II Diabetes Disease, Type II Diabetes 03/12/2021 03/12/2021 N/A Date Acquired: 0 0 N/A Weeks of Treatment: Open Open N/A Wound Status: No No N/A Wound Recurrence: Yes Yes N/A Clustered Wound: 2x4.2x0.2 2x4.2x0.2 N/A Measurements L x W x D (cm) 6.597 6.597 N/A A (cm) : rea 1.319 1.319 N/A Volume (cm) : 0.00% 0.00% N/A % Reduction in Area: 0.00% 0.00%  N/A % Reduction in Volume: Full Thickness Without Exposed Full Thickness Without Exposed N/A Classification: Support Structures Support Structures Medium Medium N/A Exudate A mount: Serosanguineous Serosanguineous N/A Exudate Type: red, brown red, brown N/A Exudate Color: Distinct, outline attached Distinct, outline  attached N/A Wound Margin: Small (1-33%) Small (1-33%) N/A Granulation Amount: Red Red N/A Granulation Quality: Large (67-100%) Large (67-100%) N/A Necrotic Amount: Eschar, Adherent Slough Eschar, Adherent Slough N/A Necrotic Tissue: Fat Layer (Subcutaneous Tissue): Yes Fat Layer (Subcutaneous Tissue): Yes N/A Exposed Structures: Fascia: No Fascia: No Tendon: No Tendon: No Muscle: No Muscle: No Joint: No Joint: No Bone: No Bone: No None None N/A Epithelialization: Treatment Notes Electronic Signature(s) Signed: 09/07/2021 9:[name]:36 AM By: Duanne Guess MD FACS Signed: 09/07/2021 5:39:10 PM By: Karie Schwalbe RN Entered By: Duanne Guess on 09/07/2021 09:[name]:36 -------------------------------------------------------------------------------- Multi-Disciplinary Care Plan Details Patient Name: Date of Service: [name], A DRIA N [name]. 09/07/2021 8:00 A M Medical Record Number: [id number] Patient Account Number: [id number] Date of Birth/Sex: Treating RN: [birth year] Shawn (41 y.o. [name] Shawn Care Dajanique Robley: Georganna Skeans Other Clinician: Referring Caesar Mannella: Treating Domenica Weightman/Extender: Kathreen Cosier in Treatment: 0 Active Inactive Venous Leg Ulcer Nursing Diagnoses: Actual venous Insuffiency (use after diagnosis is confirmed) Goals: Patient will maintain optimal edema control Date Initiated: 09/07/2021 Target Resolution Date: 10/05/2021 Goal Status: Active Interventions: Compression as ordered Treatment Activities: Therapeutic compression applied : 09/07/2021 Notes: Electronic Signature(s) Signed: 09/07/2021 5:39:10 PM By: Karie Schwalbe RN Entered By: Karie Schwalbe on 09/07/2021 17:16:35 -------------------------------------------------------------------------------- Pain Assessment Details Patient Name: Date of Service: [name], Shawn Oppenheim [name]. 09/07/2021 8:00 A M Medical Record Number: [id number] Patient Account Number: [id number] Date  of Birth/Sex: Treating RN: [birth year] (41 y.o. [name] Shawn Care Rakel Junio: Georganna Skeans Other Clinician: Referring Maysie Parkhill: Treating Yuriel Lopezmartinez/Extender: Kathreen Cosier in Treatment: 0 Active Problems Location of Pain Severity and Description of Pain Patient Has Paino No Site Locations Pain Management and Medication Current Pain Management: Electronic Signature(s) Signed: 09/07/2021 5:39:10 PM By: Karie Schwalbe RN Entered By: Karie Schwalbe on 09/07/2021 09:03:25 -------------------------------------------------------------------------------- Patient/Caregiver Education Details Patient Name: Date of Service: [name] 6/30/2023andnbsp8:00 A M Medical Record Number: [id number] Patient Account Number: [id number] Date of Birth/Gender: Treating RN: [birth year] Shawn (41 y.o. [name] Shawn Care Physician: Georganna Skeans Other Clinician: Referring Physician: Treating Physician/Extender: Kathreen Cosier in Treatment: 0 Education Assessment Education Provided To: Patient Education Topics Provided Wound/Skin Impairment: Methods: Explain/Verbal Responses: Return demonstration correctly Electronic Signature(s) Signed: 09/07/2021 5:39:10 PM By: Karie Schwalbe RN Entered By: Karie Schwalbe on 09/07/2021 17:16:48 -------------------------------------------------------------------------------- Wound Assessment Details Patient Name: Date of Service: [name], Shawn Oppenheim [name]. 09/07/2021 8:00 A M Medical Record Number: [id number] Patient Account Number: [id number] Date of Birth/Sex: Treating RN: [birth year] (41 y.o. [name] Shawn Care Gemini Bunte: Georganna Skeans Other Clinician: Referring Jessyca Sloan: Treating Vianey Caniglia/Extender: Bertrum Sol Weeks in Treatment: 0 Wound Status Wound Number: 36 Shawn Venous Leg Ulcer Etiology: Wound Location: Right, Posterior Lower Leg Wound  Open Wounding Event: Gradually Appeared Status: Date Acquired: 03/12/2021 Comorbid Lymphedema, Deep Vein Thrombosis, Hypertension, Peripheral Weeks Of Treatment: 0 History: Venous Disease, Type II Diabetes Clustered Wound: No Photos Wound Measurements Length: (cm) 5 Width: (cm) 4.2 Depth: (cm) 0.6 Area: (cm) 16.493 Volume: (cm) 9.896 % Reduction in Area: 0% % Reduction in Volume: 0% Epithelialization: Small (1-33%) Tunneling: No Undermining: No Wound Description Classification: Full Thickness Without Exposed Support Structures Wound Margin: Distinct, outline attached  Exudate Amount: Medium Exudate Type: Serosanguineous Exudate Color: red, brown Foul Odor After Cleansing: No Slough/Fibrino Yes Wound Bed Granulation Amount: Medium (34-66%) Exposed Structure Granulation Quality: Pink Fascia Exposed: No Necrotic Amount: Medium (34-66%) Fat Layer (Subcutaneous Tissue) Exposed: Yes Necrotic Quality: Adherent Slough Tendon Exposed: No Muscle Exposed: No Joint Exposed: No Bone Exposed: No Treatment Notes Wound #36 (Lower Leg) Wound Laterality: Right, Posterior Cleanser Normal Saline Discharge Instruction: Cleanse the wound with Normal Saline prior to applying a clean dressing using gauze sponges, not tissue or cotton balls. Soap and Water Discharge Instruction: Shawn shower and wash wound with dial antibacterial soap and water prior to dressing change. Peri-Wound Care Topical Shawn Dressing KerraCel Ag Gelling Fiber Dressing, 4x5 in (silver alginate) Discharge Instruction: Apply silver alginate to wound bed as instructed Secondary Dressing ABD Pad, 8x10 Discharge Instruction: Apply over Shawn dressing as directed. Woven Gauze Sponge, Non-Sterile 4x4 in Discharge Instruction: Apply over Shawn dressing as directed. Secured With Paper Tape, 2x10 (in/yd) Discharge Instruction: Secure dressing with tape as directed. Compression Wrap FourPress (4 layer compression  wrap) Discharge Instruction: Apply four layer compression as directed. Shawn also use Miliken CoFlex 2 layer compression system as alternative. Compression Stockings Add-Ons Electronic Signature(s) Signed: 09/07/2021 5:39:10 PM By: Karie Schwalbe RN Entered By: Karie Schwalbe on 09/07/2021 [name]:52:43 -------------------------------------------------------------------------------- Wound Assessment Details Patient Name: Date of Service: [name], A DRIA N [name]. 09/07/2021 8:00 A M Medical Record Number: [id number] Patient Account Number: [id number] Date of Birth/Sex: Treating RN: [birth year] (41 y.o. [name] Shawn Care Ladarren Steiner: Georganna Skeans Other Clinician: Referring Brycin Kille: Treating Anilah Huck/Extender: Bertrum Sol Weeks in Treatment: 0 Wound Status Wound Number: 37 Shawn Venous Leg Ulcer Etiology: Wound Location: Right, Medial Lower Leg Wound Open Wounding Event: Gradually Appeared Status: Date Acquired: 03/12/2021 Comorbid Lymphedema, Deep Vein Thrombosis, Hypertension, Peripheral Weeks Of Treatment: 0 History: Venous Disease, Type II Diabetes Clustered Wound: Yes Photos Wound Measurements Length: (cm) 4 Width: (cm) 2.5 Depth: (cm) 0.3 Area: (cm) 7.854 Volume: (cm) 2.356 % Reduction in Area: 0% % Reduction in Volume: 0% Epithelialization: Small (1-33%) Tunneling: No Undermining: No Wound Description Classification: Full Thickness Without Exposed Support Structures Wound Margin: Distinct, outline attached Exudate Amount: Medium Exudate Type: Serosanguineous Exudate Color: red, brown Foul Odor After Cleansing: No Slough/Fibrino Yes Wound Bed Granulation Amount: Medium (34-66%) Exposed Structure Granulation Quality: Red Fascia Exposed: No Necrotic Amount: Medium (34-66%) Fat Layer (Subcutaneous Tissue) Exposed: Yes Necrotic Quality: Adherent Slough Tendon Exposed: No Muscle Exposed: No Joint Exposed: No Bone Exposed: No Treatment  Notes Wound #37 (Lower Leg) Wound Laterality: Right, Medial Cleanser Normal Saline Discharge Instruction: Cleanse the wound with Normal Saline prior to applying a clean dressing using gauze sponges, not tissue or cotton balls. Soap and Water Discharge Instruction: Shawn shower and wash wound with dial antibacterial soap and water prior to dressing change. Peri-Wound Care Topical Shawn Dressing KerraCel Ag Gelling Fiber Dressing, 4x5 in (silver alginate) Discharge Instruction: Apply silver alginate to wound bed as instructed Secondary Dressing ABD Pad, 8x10 Discharge Instruction: Apply over Shawn dressing as directed. Woven Gauze Sponge, Non-Sterile 4x4 in Discharge Instruction: Apply over Shawn dressing as directed. Secured With Paper Tape, 2x10 (in/yd) Discharge Instruction: Secure dressing with tape as directed. Compression Wrap FourPress (4 layer compression wrap) Discharge Instruction: Apply four layer compression as directed. Shawn also use Miliken CoFlex 2 layer compression system as alternative. Compression Stockings Add-Ons Electronic Signature(s) Signed: 09/07/2021 5:39:10 PM By: Karie Schwalbe RN Entered By: Karie Schwalbe on 09/07/2021 [name]:53:30 --------------------------------------------------------------------------------  Wound Assessment Details Patient Name: Date of Service: [name], Shawn Oppenheim [name]. 09/07/2021 8:00 A M Medical Record Number: [id number] Patient Account Number: [id number] Date of Birth/Sex: Treating RN: [birth year] (41 y.o. [name] Shawn Care Ismael Karge: Georganna Skeans Other Clinician: Referring Enrika Aguado: Treating Veola Cafaro/Extender: Bertrum Sol Weeks in Treatment: 0 Wound Status Wound Number: 38 Shawn Venous Leg Ulcer Etiology: Wound Location: Right, Anterior Lower Leg Wound Open Wounding Event: Gradually Appeared Status: Date Acquired: 03/12/2021 Comorbid Lymphedema, Deep Vein Thrombosis, Hypertension,  Peripheral Weeks Of Treatment: 0 History: Venous Disease, Type II Diabetes Clustered Wound: No Photos Wound Measurements Length: (cm) 1.1 Width: (cm) 1.1 Depth: (cm) 0.2 Area: (cm) 0.95 Volume: (cm) 0.19 % Reduction in Area: 0% % Reduction in Volume: 0% Epithelialization: Small (1-33%) Tunneling: No Undermining: No Wound Description Classification: Full Thickness Without Exposed Support Structures Wound Margin: Distinct, outline attached Exudate Amount: Medium Exudate Type: Serosanguineous Exudate Color: red, brown Foul Odor After Cleansing: No Slough/Fibrino Yes Wound Bed Granulation Amount: Small (1-33%) Exposed Structure Granulation Quality: Red Fascia Exposed: No Necrotic Amount: Large (67-100%) Fat Layer (Subcutaneous Tissue) Exposed: Yes Necrotic Quality: Adherent Slough Tendon Exposed: No Muscle Exposed: No Joint Exposed: No Bone Exposed: No Treatment Notes Wound #38 (Lower Leg) Wound Laterality: Right, Anterior Cleanser Normal Saline Discharge Instruction: Cleanse the wound with Normal Saline prior to applying a clean dressing using gauze sponges, not tissue or cotton balls. Soap and Water Discharge Instruction: Shawn shower and wash wound with dial antibacterial soap and water prior to dressing change. Peri-Wound Care Topical Shawn Dressing KerraCel Ag Gelling Fiber Dressing, 4x5 in (silver alginate) Discharge Instruction: Apply silver alginate to wound bed as instructed Secondary Dressing ABD Pad, 8x10 Discharge Instruction: Apply over Shawn dressing as directed. Woven Gauze Sponge, Non-Sterile 4x4 in Discharge Instruction: Apply over Shawn dressing as directed. Secured With Paper Tape, 2x10 (in/yd) Discharge Instruction: Secure dressing with tape as directed. Compression Wrap FourPress (4 layer compression wrap) Discharge Instruction: Apply four layer compression as directed. Shawn also use Miliken CoFlex 2 layer compression system as  alternative. Compression Stockings Add-Ons Electronic Signature(s) Signed: 09/07/2021 5:39:10 PM By: Karie Schwalbe RN Signed: 09/07/2021 5:39:10 PM By: Karie Schwalbe RN Entered By: Karie Schwalbe on 09/07/2021 [name]:54:31 -------------------------------------------------------------------------------- Wound Assessment Details Patient Name: Date of Service: [name], A DRIA N [name]. 09/07/2021 8:00 A M Medical Record Number: [id number] Patient Account Number: [id number] Date of Birth/Sex: Treating RN: [birth year] (41 y.o. [name] Shawn Care Jaid Quirion: Georganna Skeans Other Clinician: Referring Darlynn Ricco: Treating Binyomin Brann/Extender: Bertrum Sol Weeks in Treatment: 0 Wound Status Wound Number: 39 Shawn Venous Leg Ulcer Etiology: Wound Location: Right, Lateral Lower Leg Wound Open Wounding Event: Gradually Appeared Status: Date Acquired: 03/12/2021 Comorbid Lymphedema, Deep Vein Thrombosis, Hypertension, Peripheral Weeks Of Treatment: 0 History: Venous Disease, Type II Diabetes Clustered Wound: Yes Wound Measurements Length: (cm) 2 Width: (cm) 4.2 Depth: (cm) 0.2 Area: (cm) 6.597 Volume: (cm) 1.319 % Reduction in Area: 0% % Reduction in Volume: 0% Epithelialization: None Tunneling: No Undermining: No Wound Description Classification: Full Thickness Without Exposed Support Structures Wound Margin: Distinct, outline attached Exudate Amount: Medium Exudate Type: Serosanguineous Exudate Color: red, brown Foul Odor After Cleansing: No Slough/Fibrino Yes Wound Bed Granulation Amount: Small (1-33%) Exposed Structure Granulation Quality: Red Fascia Exposed: No Necrotic Amount: Large (67-100%) Fat Layer (Subcutaneous Tissue) Exposed: Yes Necrotic Quality: Eschar, Adherent Slough Tendon Exposed: No Muscle Exposed: No Joint Exposed: No Bone Exposed: No Electronic Signature(s) Signed: 09/07/2021 5:39:10 PM By: Baruch Merl,  Randa Evens RN Entered By:  Karie Schwalbe on 09/07/2021 [name]:55:55 -------------------------------------------------------------------------------- Wound Assessment Details Patient Name: Date of Service: Shawn Bering [name]. 09/07/2021 8:00 A M Medical Record Number: [id number] Patient Account Number: [id number] Date of Birth/Sex: Treating RN: Shawn/[name]/[birth year] (41 y.o. [name] Shawn Care Jerris Fleer: Georganna Skeans Other Clinician: Referring Pranavi Aure: Treating Rooney Swails/Extender: Bertrum Sol Weeks in Treatment: 0 Wound Status Wound Number: 39 Shawn Venous Leg Ulcer Etiology: Etiology: Wound Location: Right, Lateral Lower Leg Wound Open Wounding Event: Gradually Appeared Status: Date Acquired: 03/12/2021 Comorbid Lymphedema, Deep Vein Thrombosis, Hypertension, Peripheral Weeks Of Treatment: 0 History: Venous Disease, Type II Diabetes Clustered Wound: Yes Photos Wound Measurements Length: (cm) 2 Width: (cm) 4.2 Depth: (cm) 0.2 Area: (cm) 6.597 Volume: (cm) 1.319 % Reduction in Area: 0% % Reduction in Volume: 0% Epithelialization: None Tunneling: No Undermining: No Wound Description Classification: Full Thickness Without Exposed Support Structures Wound Margin: Distinct, outline attached Exudate Amount: Medium Exudate Type: Serosanguineous Exudate Color: red, brown Foul Odor After Cleansing: No Slough/Fibrino Yes Wound Bed Granulation Amount: Small (1-33%) Exposed Structure Granulation Quality: Red Fascia Exposed: No Necrotic Amount: Large (67-100%) Fat Layer (Subcutaneous Tissue) Exposed: Yes Necrotic Quality: Eschar, Adherent Slough Tendon Exposed: No Muscle Exposed: No Joint Exposed: No Bone Exposed: No Treatment Notes Wound #39 (Lower Leg) Wound Laterality: Right, Lateral Cleanser Normal Saline Discharge Instruction: Cleanse the wound with Normal Saline prior to applying a clean dressing using gauze sponges, not tissue or cotton balls. Soap and  Water Discharge Instruction: Shawn shower and wash wound with dial antibacterial soap and water prior to dressing change. Peri-Wound Care Topical Shawn Dressing KerraCel Ag Gelling Fiber Dressing, 4x5 in (silver alginate) Discharge Instruction: Apply silver alginate to wound bed as instructed Secondary Dressing ABD Pad, 8x10 Discharge Instruction: Apply over Shawn dressing as directed. Woven Gauze Sponge, Non-Sterile 4x4 in Discharge Instruction: Apply over Shawn dressing as directed. Secured With Paper Tape, 2x10 (in/yd) Discharge Instruction: Secure dressing with tape as directed. Compression Wrap FourPress (4 layer compression wrap) Discharge Instruction: Apply four layer compression as directed. Shawn also use Miliken CoFlex 2 layer compression system as alternative. Compression Stockings Add-Ons Electronic Signature(s) Signed: 09/07/2021 5:39:10 PM By: Karie Schwalbe RN Entered By: Karie Schwalbe on 09/07/2021 [name]:56:45 -------------------------------------------------------------------------------- Vitals Details Patient Name: Date of Service: [name], A DRIA N [name]. 09/07/2021 8:00 A M Medical Record Number: [id number] Patient Account Number: [id number] Date of Birth/Sex: Treating RN: [birth year] (41 y.o. [name] Shawn Care Suhani Stillion: Georganna Skeans Other Clinician: Referring Justan Gaede: Treating Shalonda Sachse/Extender: Kathreen Cosier in Treatment: 0 Vital Signs Time Taken: [name]:26 Temperature (F): 98.5 Height (in): 74 Pulse (bpm): 86 Source: Stated Respiratory Rate (breaths/min): 20 Weight (lbs): 450 Blood Pressure (mmHg): 151/91 Source: Stated Reference Range: 80 - 120 mg / dl Body Mass Index (BMI): 57.8 Electronic Signature(s) Signed: 09/07/2021 5:39:10 PM By: Karie Schwalbe RN Entered By: Karie Schwalbe on 09/07/2021 [name]:31:09

## 2021-09-07 NOTE — Progress Notes (Signed)
[name] ([id number]) . Visit Report for 09/07/2021 Abuse Risk Screen Details Patient Name: Date of Service: [name]. 09/07/2021 8:00 A M Medical Record Number: [id number] Patient Account Number: [id number] Date of Birth/Sex: Treating RN: [birth year] (41 y.o. [name] Primary Care [name]: [name] Other Clinician: Referring [name]: Shawn [name]/Extender: [name] in Treatment: 0 Abuse Risk Screen Items Answer ABUSE RISK SCREEN: Has anyone close to you tried to hurt or harm you recentlyo No Do you feel uncomfortable with anyone in your familyo No Has anyone forced you do things that you didnt want to doo No Electronic Signature(s) Signed: 09/07/2021 5:39:10 PM By: Shawn Schwalbe RN Entered By: [name] on 09/07/2021 08:32:43 -------------------------------------------------------------------------------- Activities of Daily Living Details Patient Name: Date of Service: Shawn Bering [name]. 09/07/2021 8:00 A M Medical Record Number: [id number] Patient Account Number: [id number] Date of Birth/Sex: Treating RN: 1980-06-22 (41 y.o. [name] Primary Care [name]: [name] Other Clinician: Referring [name]: Shawn [name]/Extender: [name] in Treatment: 0 Activities of Daily Living Items Answer Activities of Daily Living (Please select one for each item) Drive Automobile Completely Able [name] Medications ake Completely Able Use [name] elephone Completely Able Care for Appearance Completely Able Use [name] oilet Completely Able Bath / Shower Completely Able Dress Self Completely Able Feed Self Completely Able Walk Completely Able Get In / Out Bed Completely Able Housework Completely Able Prepare Meals Completely Able Handle Money Completely Able Shop for Self Completely Able Electronic Signature(s) Signed: 09/07/2021 5:39:10 PM By: Shawn Schwalbe RN Entered By:  [name] on 09/07/2021 08:33:11 -------------------------------------------------------------------------------- Education Screening Details Patient Name: Date of Service: Shawn NG, A DRIA N [name]. 09/07/2021 8:00 A M Medical Record Number: [id number] Patient Account Number: [id number] Date of Birth/Sex: Treating RN: 25-Feb-1981 (41 y.o. [name] Primary Care [name]: [name] Other Clinician: Referring [name]: Shawn [name]/Extender: [name] in Treatment: 0 Primary Learner Assessed: Patient Learning Preferences/Education Level/Primary Language Learning Preference: Explanation, Demonstration, Printed Material Preferred Language: English Cognitive Barrier Language Barrier: No Translator Needed: No Memory Deficit: No Emotional Barrier: No Cultural/Religious Beliefs Affecting Medical Care: No Physical Barrier Impaired Vision: No Impaired Hearing: No Decreased Hand dexterity: No Knowledge/Comprehension Knowledge Level: High Comprehension Level: High Ability to understand written instructions: High Ability to understand verbal instructions: High Motivation Anxiety Level: Calm Cooperation: Cooperative Education Importance: Acknowledges Need Interest in Health Problems: Asks Questions Perception: Coherent Willingness to Engage in Self-Management High Activities: Readiness to Engage in Self-Management High Activities: Electronic Signature(s) Signed: 09/07/2021 5:39:10 PM By: Shawn Schwalbe RN Entered By: [name] on 09/07/2021 08:34:49 -------------------------------------------------------------------------------- Fall Risk Assessment Details Patient Name: Date of Service: Shawn NG, A DRIA N [name]. 09/07/2021 8:00 A M Medical Record Number: [id number] Patient Account Number: [id number] Date of Birth/Sex: Treating RN: [birth year] (41 y.o. [name] Primary Care [name]: [name] Other Clinician: Referring  [name]: Shawn [name]/Extender: [name] in Treatment: 0 Fall Risk Assessment Items Have you had 2 or more falls in the last 12 monthso 0 No Have you had any fall that resulted in injury in the last 12 monthso 0 No FALLS RISK SCREEN History of falling - immediate or within 3 months 0 No Secondary diagnosis (Do you have 2 or more medical diagnoseso) 0 No Ambulatory aid None/bed rest/wheelchair/nurse 0 No Crutches/cane/walker 0 No Furniture 0 No Intravenous therapy Access/Saline/Heparin Lock 0 No Gait/Transferring Normal/ bed rest/ wheelchair 0  No Weak (short steps with or without shuffle, stooped but able to lift head while walking, may seek 0 No support from furniture) Impaired (short steps with shuffle, may have difficulty arising from chair, head down, impaired 0 No balance) Mental Status Oriented to own ability 0 No Electronic Signature(s) Signed: 09/07/2021 5:39:10 PM By: Shawn Schwalbe RN Entered By: [name] on 09/07/2021 08:34:55 -------------------------------------------------------------------------------- Foot Assessment Details Patient Name: Date of Service: Shawn NG, Georgeann Oppenheim [name]. 09/07/2021 8:00 A M Medical Record Number: [id number] Patient Account Number: [id number] Date of Birth/Sex: Treating RN: August 12, 1980 (41 y.o. [name] Primary Care [name]: [name] Other Clinician: Referring [name]: Shawn [name]/Extender: [name] in Treatment: 0 Foot Assessment Items Site Locations + = Sensation present, - = Sensation absent, C = Callus, U = Ulcer R = Redness, W = Warmth, M = Maceration, PU = Pre-ulcerative lesion F = Fissure, S = Swelling, D = Dryness Assessment Right: Left: Other Deformity: No No Prior Foot Ulcer: No No Prior Amputation: No No Charcot Joint: No No Ambulatory Status: Ambulatory Without Help Gait: Steady Electronic Signature(s) Signed: 09/07/2021 5:39:10  PM By: Shawn Schwalbe RN Entered By: [name] on 09/07/2021 08:36:18 -------------------------------------------------------------------------------- Nutrition Risk Screening Details Patient Name: Date of Service: Shawn NG, Georgeann Oppenheim [name]. 09/07/2021 8:00 A M Medical Record Number: [id number] Patient Account Number: [id number] Date of Birth/Sex: Treating RN: 1981/01/10 (41 y.o. [name] Primary Care Michal Strzelecki: [name] Other Clinician: Referring Kaleya Douse: Shawn Emmett Bracknell/Extender: [name] in Treatment: 0 Height (in): 74 Weight (lbs): 450 Body Mass Index (BMI): 57.8 Nutrition Risk Screening Items Score Screening NUTRITION RISK SCREEN: I have an illness or condition that made me change the kind and/or amount of food I eat 0 No I eat fewer than two meals per day 0 No I eat few fruits and vegetables, or milk products 0 No I have three or more drinks of beer, liquor or wine almost every day 0 No I have tooth or mouth problems that make it hard for me to eat 0 No I don'[name] always have enough money to buy the food I need 0 No I eat alone most of the time 0 No I take three or more different prescribed or over-the-counter drugs a day 0 No Without wanting to, I have lost or gained 10 pounds in the last six months 0 No I am not always physically able to shop, cook and/or feed myself 0 No Nutrition Protocols Good Risk Protocol 0 No interventions needed Moderate Risk Protocol High Risk Proctocol Risk Level: Good Risk Score: 0 Electronic Signature(s) Signed: 09/07/2021 5:39:10 PM By: Shawn Schwalbe RN Entered By: [name] on 09/07/2021 08:35:01

## 2021-09-10 NOTE — Progress Notes (Signed)
Zirkle, [name] ([id number]) . Visit Report for 09/07/2021 Chief Complaint Document Details Patient Name: Date of Service: [name] 09/07/2021 8:00 [name] Medical Record Number: [id number] Patient Account Number: [id number] Date of Birth/Sex: Treating RN: 05/27/1980 (41 y.o. Dianna Limbo Primary Care Provider: Georganna Skeans Other Clinician: Referring Provider: Treating Provider/Extender: Kathreen Cosier in Treatment: 0 Information Obtained from: Patient Chief Complaint pt has blockage or absence of r iliac vein 05/22/2018; patient is here for review of wound on his right lower extremity 09/19/2020; patient is again here for wound review of wounds on his right lower leg 09/07/2021: he returns with open wounds on the RLE Electronic Signature(s) Signed: 09/07/2021 9:09:45 AM By: Duanne Guess MD FACS Entered By: Duanne Guess on 09/07/2021 09:09:45 -------------------------------------------------------------------------------- Debridement Details Patient Name: Date of Service: [name], [name]. 09/07/2021 8:00 [name] Medical Record Number: [id number] Patient Account Number: [id number] Date of Birth/Sex: Treating RN: 02/26/1981 (41 y.o. Dianna Limbo Primary Care Provider: Georganna Skeans Other Clinician: Referring Provider: Treating Provider/Extender: Kathreen Cosier in Treatment: 0 Debridement Performed for Assessment: Wound #37 Right,Medial Lower Leg Performed By: Physician Duanne Guess, MD Debridement Type: Debridement Severity of Tissue Pre Debridement: Fat layer exposed Level of Consciousness (Pre-procedure): Awake and Alert Pre-procedure Verification/Time Out Yes - 09:01 Taken: Start Time: 09:01 [name] Area Debrided (L x W): otal 4 (cm) x 2.5 (cm) = 10 (cm) Tissue and other material debrided: Non-Viable, Slough, Subcutaneous, Slough Level: Skin/Subcutaneous Tissue Debridement Description: Excisional Instrument:  Curette Bleeding: Minimum Hemostasis Achieved: Pressure End Time: 09:05 Procedural Pain: 0 Post Procedural Pain: 0 Response to Treatment: Procedure was tolerated well Level of Consciousness (Post- Awake and Alert procedure): Post Debridement Measurements of Total Wound Length: (cm) 4 Width: (cm) 2.5 Depth: (cm) 0.3 Volume: (cm) 2.356 Character of Wound/Ulcer Post Debridement: Improved Severity of Tissue Post Debridement: Fat layer exposed Post Procedure Diagnosis Same as Pre-procedure Electronic Signature(s) Signed: 09/07/2021 11:01:22 AM By: Duanne Guess MD FACS Signed: 09/07/2021 5:39:10 PM By: Karie Schwalbe RN Entered By: Karie Schwalbe on 09/07/2021 09:09:48 -------------------------------------------------------------------------------- Debridement Details Patient Name: Date of Service: [name], [name]. 09/07/2021 8:00 [name] Medical Record Number: [id number] Patient Account Number: [id number] Date of Birth/Sex: Treating RN: 04-Sep-1980 (41 y.o. Dianna Limbo Primary Care Provider: Georganna Skeans Other Clinician: Referring Provider: Treating Provider/Extender: Kathreen Cosier in Treatment: 0 Debridement Performed for Assessment: Wound #39 Right,Lateral Lower Leg Performed By: Physician Duanne Guess, MD Debridement Type: Debridement Severity of Tissue Pre Debridement: Fat layer exposed Level of Consciousness (Pre-procedure): Awake and Alert Pre-procedure Verification/Time Out Yes - 09:01 Taken: Start Time: 09:01 [name] Area Debrided (L x W): otal 2 (cm) x 4.2 (cm) = 8.4 (cm) Tissue and other material debrided: Non-Viable, Slough, Subcutaneous, Slough Level: Skin/Subcutaneous Tissue Debridement Description: Excisional Instrument: Curette Bleeding: Minimum Hemostasis Achieved: Pressure End Time: 09:05 Procedural Pain: 0 Post Procedural Pain: 0 Response to Treatment: Procedure was tolerated well Level of Consciousness (Post- Awake  and Alert procedure): Post Debridement Measurements of Total Wound Length: (cm) 2 Width: (cm) 4.2 Depth: (cm) 0.2 Volume: (cm) 1.319 Character of Wound/Ulcer Post Debridement: Improved Severity of Tissue Post Debridement: Fat layer exposed Post Procedure Diagnosis Same as Pre-procedure Electronic Signature(s) Signed: 09/07/2021 11:01:22 AM By: Duanne Guess MD FACS Signed: 09/07/2021 5:39:10 PM By: Karie Schwalbe RN Entered By: Karie Schwalbe on 09/07/2021 09:10:31 -------------------------------------------------------------------------------- Debridement Details Patient Name: Date of Service: [name], Shawn [name]. 09/07/2021 8:00 [name] Medical Record Number: [id number] Patient Account Number: [id number] Date of Birth/Sex: Treating RN: 26-Jun-1980 (41 y.o. Dianna Limbo Primary Care Provider: Georganna Skeans Other Clinician: Referring Provider: Treating Provider/Extender: Kathreen Cosier in Treatment: 0 Debridement Performed for Assessment: Wound #38 Right,Anterior Lower Leg Performed By: Physician Duanne Guess, MD Debridement Type: Debridement Severity of Tissue Pre Debridement: Fat layer exposed Level of Consciousness (Pre-procedure): Awake and Alert Pre-procedure Verification/Time Out Yes - 09:01 Taken: Start Time: 09:01 [name] Area Debrided (L x W): otal 1.1 (cm) x 1.1 (cm) = 1.21 (cm) Tissue and other material debrided: Non-Viable, Slough, Subcutaneous, Slough Level: Skin/Subcutaneous Tissue Debridement Description: Excisional Instrument: Curette Bleeding: Minimum Hemostasis Achieved: Pressure End Time: 09:05 Procedural Pain: 0 Post Procedural Pain: 0 Response to Treatment: Procedure was tolerated well Level of Consciousness (Post- Awake and Alert procedure): Post Debridement Measurements of Total Wound Length: (cm) 1.1 Width: (cm) 1.1 Depth: (cm) 0.2 Volume: (cm) 0.19 Character of Wound/Ulcer Post Debridement: Improved Severity of  Tissue Post Debridement: Fat layer exposed Post Procedure Diagnosis Same as Pre-procedure Electronic Signature(s) Signed: 09/07/2021 11:01:22 AM By: Duanne Guess MD FACS Signed: 09/07/2021 5:39:10 PM By: Karie Schwalbe RN Entered By: Karie Schwalbe on 09/07/2021 09:11:25 -------------------------------------------------------------------------------- Debridement Details Patient Name: Date of Service: [name], [name]. 09/07/2021 8:00 [name] Medical Record Number: [id number] Patient Account Number: [id number] Date of Birth/Sex: Treating RN: November 23, 1980 (41 y.o. Dianna Limbo Primary Care Provider: Georganna Skeans Other Clinician: Referring Provider: Treating Provider/Extender: Kathreen Cosier in Treatment: 0 Debridement Performed for Assessment: Wound #36 Right,Posterior Lower Leg Performed By: Physician Duanne Guess, MD Debridement Type: Debridement Severity of Tissue Pre Debridement: Fat layer exposed Level of Consciousness (Pre-procedure): Awake and Alert Pre-procedure Verification/Time Out Yes - 09:01 Taken: Start Time: 09:01 [name] Area Debrided (L x W): otal 5 (cm) x 4.2 (cm) = 21 (cm) Tissue and other material debrided: Non-Viable, Slough, Subcutaneous, Slough Level: Skin/Subcutaneous Tissue Debridement Description: Excisional Instrument: Curette Specimen: Tissue Culture Number of Specimens [name] aken: 1 Bleeding: Minimum Hemostasis Achieved: Pressure End Time: 09:05 Procedural Pain: 0 Post Procedural Pain: 0 Response to Treatment: Procedure was tolerated well Level of Consciousness (Post- Awake and Alert procedure): Post Debridement Measurements of Total Wound Length: (cm) 5 Width: (cm) 4.2 Depth: (cm) 0.6 Volume: (cm) 9.896 Character of Wound/Ulcer Post Debridement: Improved Severity of Tissue Post Debridement: Fat layer exposed Post Procedure Diagnosis Same as Pre-procedure Electronic Signature(s) Signed: 09/07/2021 11:01:22 AM By:  Duanne Guess MD FACS Signed: 09/07/2021 5:39:10 PM By: Karie Schwalbe RN Entered By: Karie Schwalbe on 09/07/2021 09:12:11 -------------------------------------------------------------------------------- HPI Details Patient Name: Date of Service: [name], [name]. 09/07/2021 8:00 [name] Medical Record Number: [id number] Patient Account Number: [id number] Date of Birth/Sex: Treating RN: 1981/01/21 (41 y.o. Dianna Limbo Primary Care Provider: Georganna Skeans Other Clinician: Referring Provider: Treating Provider/Extender: Kathreen Cosier in Treatment: 0 History of Present Illness HPI Description: The patient is Shawn 41 yrs old bm here for evaluation of his right leg ulcer. He has an extensive history of lymphedema and ulcers. He is being treated at the [facility] by Dr. [name] with [name] boots and the [facility]. He has pumps at home but he is only using them once Shawn day. 08/30/14 selective debridement done of surface eschar. The wound cleans up quite nicely in the bed of this looks healthy. He is using Shawn wound VAC under an Unna wrap. 11/17/14; selective debridement done  of surface eschar. Once again the wound beds at clean up quite nicely. He is no longer using Shawn wound VAC. Recent dressing changes include Hydrofera Blue. Apparently he has done Shawn wide variety of different topical dressings including advanced treatment options like Apligraf's without success. 03/21/15; surgical debridement done of surface eschar and nonviable subcutaneous tissue. The area on the medial aspect of the right leg has closed and the wound on the lateral right leg looks improved has been using Silver Collagen 04/11/15; I think attendance here is sporadic. The area on the right medial leg remains healed. He has Shawn new tiny area on the back of the right leg. Again Shawn surgical debridement of the surface eschar and nonviable subcutaneous tissue of the major wound on the right lateral leg. He has been using Silver  Collagen and at home, he does his own Unna wraps at home edema control seems reasonable. 04/20/15; the area on the right medial leg has Shawn very superficial area which may not even be open nevertheless I felt needed to be dressed today [this is his original chronic wound] the new wound is on the right anterior lateral leg is underwent Shawn surgical debridement. He has Shawn small wound on the right posterior leg. He puts on his own Unna boots, according to our nurses he does this fairly well. We have been using Silver collagen. 05/02/2015 -- I understand in the past his venous studies have been done at [facility] and he was advised weight loss before they would attempt to tackle his iliac vein blockage. He has not gone back for review. 06/27/2015 -- we have applied for Apligraf and are awaiting his insurance clearance. 07/25/2015 -- he still has to use her back from his insurance agent regarding his copayment for his Apligraf. 07/31/2015 -- the patient got Shawn reply from the insurance agent regarding the dollar payment for his application but is not sure whether it is for 5 or for one. 11/28/2015 -- has been hurting Shawn little bit more and he has had change in color of his wound especially on the medial part. 12/05/2015 -- his culture was positive for Proteus mirabilis and K Oxytoca which are sensitive to ciprofloxacin which he is already on. 10/3/17still on ciprofloxacin. His mother reports of dressing had to be changed last Friday due to odor. He has not been systemically unwell 12/19/15; patient came in today complaining of increasing pain and tightness in his upper right thigh. He completed the ciprofloxacin 2 or 3 days ago. He is not running Shawn fever today. 12/26/2015 -- last week he had been seen by Dr. [name] who got Shawn lower extremity venous duplex evaluation done which did not show DVT or SVT in the right lower extremity. he had also put him on Augmentin in addition to the previous ciprofloxacin and he had  received for 2 weeks 01/02/2016 -- -- was admitted to the hospital on 12/26/2015 and discharged on 12/29/2015 and was treated for cellulitis. He was also newly diagnosed with type 2 diabetes mellitus and outpatient monitoring and initiation of treatment was recommended. Patients hemoglobin A1c was 6.6 No osteomyelitis detected on x-rays and recent Doppler study was negative for DVT Oral doxycycline and Levaquin were recommended for the patient as an . outpatient for Shawn 14 day treatment. IV Zosyn and vancomycin was given due to concerns of Pseudomonas infection while he was in hospital. 02/13/2016 -- he has not gone back to [facility] where he had his vascular opinion earlier  and I have urged him to regroup with them to see if there is any surgical options available. 02/27/2016 -- has an appointment to see [facility] vascular surgeons on January 3 and may be seeing Dr. Verdie Drown 03/19/2016 -- I was not able to find any notes on Epic but the patient did go to the vascular clinic at [facility] and saw the PA to Dr. Verdie Drown. I understand she has recommended Shawn CT scan and MRI to be done on February 1 and they would review this with him on the same day. 04/09/2016 -- was admitted to the hospital on 03/20/2016 acute respiratory failure with hypoxia, abdominal discomfort with erythema, hypertensive urgency and chronic wound of the right leg with morbid obesity. he was discharged home on 04/05/2016 and was to continue on IV antibiotics for 18 more days follow-up with the wound center and continue with his cardiologist. he has lost approximately 130 pounds and diuresed over 48 L. He was treated with IV Zosyn and ID recommended he continue this for 3 weeks more. The notes from [facility] wound clinic were noted where he was seen by the PA and she had taken cultures which grew MSSA and Pseudomonas. She had recommended edema control with Shawn 4-layer compression and also referred him to the lymphedema  clinic. Shawn CT venogram was also planned for the future. 04/16/2016 -- at [facility] Shawn CT pelvic venogram runoff was done on 04/11/2016 -- IMPRESSION:-Limited study secondary to poor opacification of venous structures. No definite evidence of acute deep vein thrombosis. 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 -Extensive right iliac chain and right inguinal adenopathy, favored to be reactive/secondary to congestion. The patient continues on his IV antibiotics through his PICC line and has his cardiology opinion and also Shawn bariatric surgery opinion coming up. 04/30/2016 -- He has completed his IV antibiotics through his PICC line. 05/14/2016 --he was seen by the PA, Ms Sluss, recommended alternate day dressing with compression and use Hibiclens and Dakin's solution with acetic acid on his wounds. 07/30/2016 -- he has still not got his custom-made compression stockings and I have urged him to go and get him self measured for these. 08/06/2016 -- he has been measured for his custom stocking and will get in 2 weeks. He has lost 20 pounds since March 08/27/2016 -- he has not got his custom stockings yet and he tried another pair which has not fit well and he has had Shawn lot of maceration increase the size of his wound. 09/03/2016 -- the patient says that he has not been very compliant with his dressing changes and his elevation and exercise and this week he is notices wound get rather large with maceration. 09/18/16; according to our intake nurse the measurements on this patient's wound are slightly larger. I note previous compliance concerns. I note that his wounds measured larger last week which was noted by Dr. Meyer Russel. Culture done last week was negative. 10/01/2016 -- the patient has received some lymphedema pumps which are new and he says he is being compliant with this. He is going to be away for 2 weeks on  vacation to [facility] 10/15/2016 -- he returns after 2 weeks and tells me he has been diligent with his dressing and has lost 25 pounds over the last 3 months. 10/22/2016 -- his last hemoglobin A1c was 6.2 and he is now diet controlled. 11/19/2016 -- he has  been having problems with his compression stockings which are custom made at [facility] medical. He has not yet been able to get them. He had taken out his compression because he had gone there and had no dressing on when he came here today READMISSION 05/22/2018 [name] is Shawn now 41 year old man with Shawn Gowans history of wounds in his lower extremities secondary to chronic venous insufficiency with secondary lymphedema. He has had previous vein ligations on the right although I do not have this information in front of me. As I understand things he also has central venous obstruction with Shawn vein bypass in 2005 I believe this was done at [facility]. He tells me over the last 2 months he has noted mid reopening in the right mid anterior lower extremity. This is Shawn rectangular shaped wound which is kind of odd in terms of how that formed. He has been using silver alginate and Unna boots that we used on him when he was here in 2018. He buys his product supply online thinks this is cheaper than ordering through intermediary's Past medical history; CHF, morbid obesity, hypertension, hyperlipidemia, chronic venous insufficiency, vein bypass in 2005 previous vein ablations or ligations. He has Shawn stent in the right lower extremity. ABI in this clinic was 1.04. He is using his compression pumps at home 3/27; 2-week follow-up. Right lower extremity wounds related to chronic venous insufficiency and lymphedema. He has been using silver alginate under an Radio broadcast assistant. He change the [employer] himself at home last week. He is making nice progress 4/10; 2-week follow-up. Right lower extremity wound is just about closed Shawn small open area in the middle of the and large rectangular  wound he had at the beginning. His edema control is excellent. He says he is using his compression pumps religiously 4/17; 2-week follow-up. Right lower extremity wound is totally closed. He had Shawn large rectangular wound which is progressively closed. His edema control is very good. He is using his compression pumps once to twice Shawn day READMISSION 09/19/2020 [name] is now Shawn 41 year old man we have had for several stays in this clinic including 2017, 2018 and most recently in 2020 with Shawn wound on his right lower leg. He has compression pumps which he claims to be using twice Shawn day. He also has stockings however I am not sure how much he uses them. Things have broken down over the last month or 2 he has an extensive wound area on the right mid tibial area I think this is similar to what he has had in the past. He has been using Xeroform and an Ace wrap that his girlfriend is applying. Past medical history; the patient has known chronic venous insufficiency with secondary lymphedema. He had an iliac vein blockage and he had Shawn bypass at [facility] although the patient is not followed up with them. The surgeon may have been Dr. Jacolyn Reedy. As noted he has lymphedema pumps. Type 2 diabetes congestive heart failure obstructive sleep apnea. He has Shawn history of Shawn right leg DVT although this may have been the iliac vein he is not currently on anticoagulation ABI in our clinic is 1.37 on the right Imaging Results - in this encounter CT Pelvic Venogram Run Off Imaging Results - CT Pelvic Venogram Run Off Impressions Performed At -Limited study secondary to poor opacification of venous structures. Nodefinite evidence of acute deep vein thrombosis. 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 -Extensive right iliac chain and right inguinal adenopathy, favored to bereactive/secondary to congestion. [facility]  Results - CT Pelvic Venogram Run Off Narrative Performed At EXAM: CT PELVIC VENOGRAM RUN OFF DATE: 04/11/2016 1:28 PM ACCESSION: [id number] UN DICTATED: 04/11/2016 2:18 PM INTERPRETATION LOCATION: [facility] CLINICAL INDICATION: 41 years old Male with h/o RLE DVT and massive lymphedema of both LE's. Eval for venous outflow obstruction vs extrinsic mass- L97.203-Skin ulcer of calf with necrosis of muscle, unspecified laterality(RAF-HCC) COMPARISON: CT abdomen pelvis dated 02/11/2007 TECHNIQUE: Shawn spiral CT scan was obtained approximately 3 minutes after administration of IV contrast from the kidneys to the knees.Images were reconstructed in the axial plane. Multiplanar reformatted and MIP images were provided for further evaluation of the vessels. For selected cases, 3D volumerendered images are also provided. VASCULAR FINDINGS: There is overall poor contrast opacification of the venous structures, limitingevaluation. IVC: No evidence of thrombus. Iliac veins: The right iliac veins are chronically occluded/diminutive in size. There are extensive venous collaterals noted throughout the right pelvis extending into the right lower extremity. The number and caliber of the venous collaterals have increased when compared to 02/11/2007. The left iliac veins appear patent. There are also small caliber venous collaterals within the left pelvis extending into the left lower extremity. Anterior abdominal wall venouscollaterals are also noted but fully imaged secondary to patient diameter. The bilateral femoral and popliteal veins appear patent. There is no evidence of acute thrombus. The arterial vasculature is unremarkable on this venous phase time study. NONVASCULAR FINDINGS: LOWER CHEST Unremarkable. : ABDOMEN: HEPATOBILIARY: Unremarkable liver. No biliary ductal dilatation. Gallbladder isremarkable. PANCREAS: Unremarkable. SPLEEN: Unremarkable. ADRENAL GLANDS: Unremarkable. KIDNEYS/URETERS:  Unremarkable. BLADDER: Unremarkable. BOWEL/PERITONEUM/RETROPERITONEUM: No bowel obstruction. No acute inflammatoryprocess. No ascites. LYMPH NODES: Extensive right iliac chain and right inguinal adenopathy. Shawn nodal conglomerate in the right inguinal region measures 6.8 x 3.4 cm (2:124). This is only slightly larger when compared to 02/11/2007. Given interval stabilityover 10 years, these are favored to be reactive/secondary to congestion. REPRODUCTIVE: Unremarkable. BONES/SOFT TISSUES: No acute osseous abnormalities. Postsurgical changes in the right inguinal and left medial thigh region. Right greater than left softtissue edema throughout the lower extremities. [facility] Results - CT Pelvic Venogram Run Off Procedure Note Interface, Rad Results In - 04/11/2016 3:28 PM EST EXAM: CT PELVIC VENOGRAM RUN OFF DATE: 04/11/2016 1:28 PM ACCESSION: [id number] UN DICTATED: 04/11/2016 2:18 PM INTERPRETATION LOCATION: [facility] CLINICAL INDICATION: 41 years old Male with h/o RLE DVT and massive lymphedema of both LE's. Eval for venous outflow obstruction vs extrinsic mass- L97.203-Skin ulcer of calf with necrosis of muscle, unspecified laterality (RAF-HCC) COMPARISON: CT abdomen pelvis dated 02/11/2007 TECHNIQUE: Shawn spiral CT scan was obtained approximately 3 minutes after administration of IV contrast from the kidneys to the knees. Images were reconstructed in the axial plane. Multiplanar reformatted and MIP images were provided for further evaluation of the vessels. For selected cases, 3D volume rendered images are also provided. VASCULAR FINDINGS: There is overall poor contrast opacification of the venous structures, limiting evaluation. IVC: No evidence of thrombus. Iliac veins: The right iliac veins are chronically occluded/diminutive in size. There are extensive venous collaterals noted throughout the right pelvis extending into the right lower extremity. The number and caliber of the venous  collaterals have increased when compared to 02/11/2007. The left iliac veins appear patent. There are also small caliber venous collaterals within the left pelvis extending into the left lower extremity. Anterior abdominal wall venous collaterals are also noted but  fully imaged secondary to patient diameter. The bilateral femoral and popliteal veins appear patent. There is no evidence of acute thrombus. The arterial vasculature is unremarkable on this venous phase time study. NONVASCULAR FINDINGS: LOWER CHEST Unremarkable. : ABDOMEN: HEPATOBILIARY: Unremarkable liver. No biliary ductal dilatation. Gallbladder is remarkable. PANCREAS: Unremarkable. SPLEEN: Unremarkable. ADRENAL GLANDS: Unremarkable. KIDNEYS/URETERS: Unremarkable. BLADDER: Unremarkable. BOWEL/PERITONEUM/RETROPERITONEUM: No bowel obstruction. No acute inflammatory process. No ascites. LYMPH NODES: Extensive right iliac chain and right inguinal adenopathy. Shawn nodal conglomerate in the right inguinal region measures 6.8 x 3.4 cm (2:124). This is only slightly larger when compared to 02/11/2007. Given interval stability over 10 years, these are favored to be reactive/secondary to congestion. REPRODUCTIVE: Unremarkable. BONES/SOFT TISSUES: No acute osseous abnormalities. Postsurgical changes in the right inguinal and left medial thigh region. Right greater than left soft tissue edema throughout the lower extremities. IMPRESSION: -Limited study secondary to poor opacification of venous structures. No definite evidence of acute deep vein thrombosis. 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 -Extensive right iliac chain and right inguinal adenopathy, favored to be reactive/secondary to congestion. Imaging Results - CT Pelvic Venogram Run Off Performing Organization Address City/State/Zipcode Phone Number [facility] [name]  [place], [place] [address] Surgical summary as listed below; Assessment: [name] is Shawn 41 y.o. male with Hoefle history of right lower extremity chronic obstructive deep venous disease with recurrent ulceration, absence of right iliac vein, prior left to right femoral vein bypass. Patient continues with recurrent ulceration, his weight precludes him from further vascular studies at this time. If he is able to lose 50-100 pounds we would be able to do left lower extremity venogram possible intervention with 50% chance of re- opening occlusion per Dr. Jacolyn Reedy. For global health, weight loss, potential improvement in his chronic venous insufficiency would appreciateinput from bariatric surgery group for recommendations. 09/26/20; patient comes in today with not much improvement. He has also some odor. 3 open areas and this linear area in his mid calf require debridement. We have been using silver alginate under compression. I gave him Keflex last week for what I thought was cellulitis in the right medial thigh he is completing this and I think this is better. 7/26; patient comes in today with again necrotic debris over these wounds. According to our intake nurse extreme malodor and drainage. We have been using silver alginate under compression. He is complaining about pain in the wound area. 7/29; Patient presents for follow-up. He reports improvement to the wound. He has finished taking Keflex. He has no issues or complaints today. 10/17/2020 upon evaluation today patient appears to be doing poorly in regard to his leg ulcer. He has been tolerating the dressing changes without complication. Fortunately there does not appear to be any signs of active infection at this time. No fevers, chills, nausea, vomiting, or diarrhea. 8/23; the patient has 2 areas anteriorly and Shawn larger area medially on the right lower leg. He comes in today with his compression wrap slipping down. We do not have good edema control. We have  been using silver alginate. I believe he completed Shawn course of antibioticso Levaquin given to him 2 weeks ago at his last visit. He states his girlfriend who is Shawn nurse changes his dressings. We have not seen this in 2 weeks. He just received Shawn compounded topical antibiotic based on I believe Shawn deep tissue culture that was done. I do not have  Shawn result of the deep tissue culture today and he forgot the compounded antibiotic. I will try to clarify this next time I tried to send him back to vascular at [facility] for review of his central venous obstruction although they would not even see him I think because of noncompliance, or at least perceived noncompliance with regards to the recommendations for weight loss, consideration of bariatric assessment for surgery etc. 8/30; although the patient's wounds anteriorly on the right lower leg extending medially were debrided today. Better looking wound surface he has better edema control still under 4-layer compression. He tells me he is using his compression pump twice Shawn day however they are greater than 72 years old and he might benefit from Shawn new set. He is using his Jodie Echevaria compounded antibiotic Since the last time he was here he saw Dr. Durwin Nora at vein and vascular at our request. He was felt to have adequate arterial flow for healing although there was still the issue about whether he might need an angiogram if things still. 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 He did not talk about the s previous central venous procedures that have been done at [facility]. That information is available in care everywhere however based on the duplex exam he was not felt to be Shawn candidate for any venous procedures on the right. He has no significant venous reflux on the right and has had previously had Shawn right greater saphenous vein ablated 9/6; the patient has Shawn cluster of small areas over the right anterior mid tibia as well as an area medially. That the air 1  medially is more confluent all of them look like they have Shawn better surface. We have been putting him in 4-layer compression and using his compression pumps twice Shawn day 9/13; both of the patient's wound areas appear somewhat better especially the large area medially. 9/27; 2-week follow-up. His wife who is Shawn Designer, jewellery has been changing his 4-layer compression with Hydrofera Blue and [place] I think the measurements of the 2 remaining areas 1 on the right anterior tibia and 1 on the right lateral are better. In another setting I might consider advanced treatment product here. He is using his compression pumps 10/11; patient presents for follow-up. He has no issues or complaints today. His girlfriend continues to change his compression wrap with Hydrofera Blue and [place]. He denies signs of infection. 10/25; patient presents for follow-up. He reports Shawn new wound to the right lateral aspect. He states the area was itching and he scratched developing Shawn wound. He denies signs of infection. 11/11; patient presents for follow-up. He no longer has Shawn wound to the right lateral aspect. He had to use an Radio broadcast assistant for the past week due to short supply. He has no issues or complaints today. He denies signs of infection. READMISSION 09/07/2021: The patient is back with new wounds on his right lower extremity. He says that they first emerged around the first of the year. He was seen in the emergency department on June 22 for angioedema either secondary to his ARB or Shawn new lotion that he tried. He was given prednisone. At the same time, the provider in the ER ordered doxycycline to cover his leg ulcer and applied Shawn Unna boot. He did not have an Radio broadcast assistant on in clinic today but his significant other and he have been applying Neosporin with Kerlix and Coban wrapping. He denies any fevers or chills.  No pain. No significant odor. All of the wounds have the fat layer exposed and there is slough buildup on all  open surfaces. Electronic Signature(s) Signed: 09/07/2021 9:12:24 AM By: Duanne Guessannon, Kyan Giannone MD FACS Entered By: Duanne Guessannon, Naida Escalante on 09/07/2021 09:12:24 -------------------------------------------------------------------------------- Physical Exam Details Patient Name: Date of Service: [name], [name]. 09/07/2021 8:00 [name] Medical Record Number: [id number] Patient Account Number: [id number] Date of Birth/Sex: Treating RN: 06/26/1980 (41 y.o. Dianna LimboM) Scotton, Joanne Primary Care Provider: Georganna SkeansWilson, Amelia Other Clinician: Referring Provider: Treating Provider/Extender: Kathreen Cosierannon, Arieanna Pressey Wilson, Amelia Weeks in Treatment: 0 Constitutional He is hypertensive but asymptomatic.. . . . Super morbid obesity. No acute distress. Respiratory Normal work of breathing on room air.. Cardiovascular Skin changes consistent with longstanding lymphedema.. Notes 09/07/2021: There are 4 main ulcers. All of them have slough accumulation on the surface. No significant odor or purulent drainage. All of them have the fat layer exposed. Electronic Signature(s) Signed: 09/07/2021 9:14:43 AM By: Duanne Guessannon, Kimorah Ridolfi MD FACS Entered By: Duanne Guessannon, Deatra Mcmahen on 09/07/2021 09:14:43 -------------------------------------------------------------------------------- Physician Orders Details Patient Name: Date of Service: [name], [name]. 09/07/2021 8:00 [name] Medical Record Number: [id number] Patient Account Number: [id number] Date of Birth/Sex: Treating RN: 06/26/1980 (41 y.o. Dianna LimboM) Scotton, Joanne Primary Care Provider: Georganna SkeansWilson, Amelia Other Clinician: Referring Provider: Treating Provider/Extender: Kathreen Cosierannon, Abdirahim Flavell Wilson, Amelia Weeks in Treatment: 0 Verbal / Phone Orders: No Diagnosis Coding ICD-10 Coding Code Description [id number] Non-pressure chronic ulcer of other part of right lower leg with fat layer exposed I50.9 Heart failure, unspecified I10 Essential (primary) hypertension E11.622 Type 2 diabetes mellitus with other  skin ulcer E66.01 Morbid (severe) obesity due to excess calories I89.0 Lymphedema, not elsewhere classified Follow-up Appointments Return appointment in 3 weeks. - Dr. Lady Garyannon Room 3 Friday 09/14/21 at 1:15pm Bathing/ Shower/ Hygiene May shower with protection but do not get wound dressing(s) wet. - May use Shawn cast wrap to put over wraps to shower; [facility] Edema Control - Lymphedema / SCD / Other Lymphedema Pumps. Use Lymphedema pumps on leg(s) 2-3 times Shawn day for 45-60 minutes. If wearing any wraps or hose, do not remove them. Continue exercising as instructed. Elevate legs to the level of the heart or above for 30 minutes daily and/or when sitting, Shawn frequency of: - throughout the day Avoid standing for Frein periods of time. Patient to wear own compression stockings every day. - on Left Leg Wound Treatment Wound #36 - Lower Leg Wound Laterality: Right, Posterior Cleanser: Normal Saline 1 x Per Week/30 Days Discharge Instructions: Cleanse the wound with Normal Saline prior to applying Shawn clean dressing using gauze sponges, not tissue or cotton balls. Cleanser: Soap and Water 1 x Per Week/30 Days Discharge Instructions: May shower and wash wound with dial antibacterial soap and water prior to dressing change. Prim Dressing: KerraCel Ag Gelling Fiber Dressing, 4x5 in (silver alginate) 1 x Per Week/30 Days ary Discharge Instructions: Apply silver alginate to wound bed as instructed Secondary Dressing: ABD Pad, 8x10 1 x Per Week/30 Days Discharge Instructions: Apply over primary dressing as directed. Secondary Dressing: Woven Gauze Sponge, Non-Sterile 4x4 in 1 x Per Week/30 Days Discharge Instructions: Apply over primary dressing as directed. Secured With: Paper Tape, 2x10 (in/yd) 1 x Per Week/30 Days Discharge Instructions: Secure dressing with tape as directed. Compression Wrap: FourPress (4 layer compression wrap) 1 x Per Week/30 Days Discharge Instructions:  Apply four layer compression as directed. May also use Miliken CoFlex 2 layer  compression system as alternative. Wound #37 - Lower Leg Wound Laterality: Right, Medial Cleanser: Normal Saline 1 x Per Week/30 Days Discharge Instructions: Cleanse the wound with Normal Saline prior to applying Shawn clean dressing using gauze sponges, not tissue or cotton balls. Cleanser: Soap and Water 1 x Per Week/30 Days Discharge Instructions: May shower and wash wound with dial antibacterial soap and water prior to dressing change. Prim Dressing: KerraCel Ag Gelling Fiber Dressing, 4x5 in (silver alginate) 1 x Per Week/30 Days ary Discharge Instructions: Apply silver alginate to wound bed as instructed Secondary Dressing: ABD Pad, 8x10 1 x Per Week/30 Days Discharge Instructions: Apply over primary dressing as directed. Secondary Dressing: Woven Gauze Sponge, Non-Sterile 4x4 in 1 x Per Week/30 Days Discharge Instructions: Apply over primary dressing as directed. Secured With: Paper Tape, 2x10 (in/yd) 1 x Per Week/30 Days Discharge Instructions: Secure dressing with tape as directed. Compression Wrap: FourPress (4 layer compression wrap) 1 x Per Week/30 Days Discharge Instructions: Apply four layer compression as directed. May also use Miliken CoFlex 2 layer compression system as alternative. Wound #38 - Lower Leg Wound Laterality: Right, Anterior Cleanser: Normal Saline 1 x Per Week/30 Days Discharge Instructions: Cleanse the wound with Normal Saline prior to applying Shawn clean dressing using gauze sponges, not tissue or cotton balls. Cleanser: Soap and Water 1 x Per Week/30 Days Discharge Instructions: May shower and wash wound with dial antibacterial soap and water prior to dressing change. Prim Dressing: KerraCel Ag Gelling Fiber Dressing, 4x5 in (silver alginate) 1 x Per Week/30 Days ary Discharge Instructions: Apply silver alginate to wound bed as instructed Secondary Dressing: ABD Pad, 8x10 1 x Per  Week/30 Days Discharge Instructions: Apply over primary dressing as directed. Secondary Dressing: Woven Gauze Sponge, Non-Sterile 4x4 in 1 x Per Week/30 Days Discharge Instructions: Apply over primary dressing as directed. Secured With: Paper Tape, 2x10 (in/yd) 1 x Per Week/30 Days Discharge Instructions: Secure dressing with tape as directed. Compression Wrap: FourPress (4 layer compression wrap) 1 x Per Week/30 Days Discharge Instructions: Apply four layer compression as directed. May also use Miliken CoFlex 2 layer compression system as alternative. Wound #39 - Lower Leg Wound Laterality: Right, Lateral Cleanser: Normal Saline 1 x Per Week/30 Days Discharge Instructions: Cleanse the wound with Normal Saline prior to applying Shawn clean dressing using gauze sponges, not tissue or cotton balls. Cleanser: Soap and Water 1 x Per Week/30 Days Discharge Instructions: May shower and wash wound with dial antibacterial soap and water prior to dressing change. Prim Dressing: KerraCel Ag Gelling Fiber Dressing, 4x5 in (silver alginate) 1 x Per Week/30 Days ary Discharge Instructions: Apply silver alginate to wound bed as instructed Secondary Dressing: ABD Pad, 8x10 1 x Per Week/30 Days Discharge Instructions: Apply over primary dressing as directed. Secondary Dressing: Woven Gauze Sponge, Non-Sterile 4x4 in 1 x Per Week/30 Days Discharge Instructions: Apply over primary dressing as directed. Secured With: Paper Tape, 2x10 (in/yd) 1 x Per Week/30 Days Discharge Instructions: Secure dressing with tape as directed. Compression Wrap: FourPress (4 layer compression wrap) 1 x Per Week/30 Days Discharge Instructions: Apply four layer compression as directed. May also use Miliken CoFlex 2 layer compression system as alternative. Electronic Signature(s) Signed: 09/07/2021 5:39:10 PM By: Karie Schwalbe RN Signed: 09/10/2021 7:33:01 AM By: Duanne Guess MD FACS Previous Signature: 09/07/2021 11:01:22 AM  Version By: Duanne Guess MD FACS Previous Signature: 09/07/2021 9:14:55 AM Version By: Duanne Guess MD FACS Entered By: Karie Schwalbe on 09/07/2021 17:15:45 -------------------------------------------------------------------------------- Problem List  Details Patient Name: Date of Service: [name]. 09/07/2021 8:00 [name] Medical Record Number: [id number] Patient Account Number: [id number] Date of Birth/Sex: Treating RN: [birth year] (41 y.o. Dianna Limbo Primary Care Provider: Georganna Skeans Other Clinician: Referring Provider: Treating Provider/Extender: Kathreen Cosier in Treatment: 0 Active Problems ICD-10 Encounter Code Description Active Date MDM Diagnosis [phone] Non-pressure chronic ulcer of other part of right lower leg with fat layer 09/07/2021 No Yes exposed I50.9 Heart failure, unspecified 09/07/2021 No Yes I10 Essential (primary) hypertension 09/07/2021 No Yes E11.622 Type 2 diabetes mellitus with other skin ulcer 09/07/2021 No Yes E66.01 Morbid (severe) obesity due to excess calories 09/07/2021 No Yes I89.0 Lymphedema, not elsewhere classified 09/07/2021 No Yes Inactive Problems Resolved Problems Electronic Signature(s) Signed: 09/07/2021 9:08:24 AM By: Duanne Guess MD FACS Previous Signature: 09/07/2021 8:59:00 AM Version By: Duanne Guess MD FACS Entered By: Duanne Guess on 09/07/2021 09:08:24 -------------------------------------------------------------------------------- Progress Note Details Patient Name: Date of Service: [name], [name]. 09/07/2021 8:00 [name] Medical Record Number: [id number] Patient Account Number: [id number] Date of Birth/Sex: Treating RN: [birth year] (41 y.o. Dianna Limbo Primary Care Provider: Georganna Skeans Other Clinician: Referring Provider: Treating Provider/Extender: Kathreen Cosier in Treatment: 0 Subjective Chief Complaint Information obtained from  Patient pt has blockage or absence of r iliac vein 05/22/2018; patient is here for review of wound on his right lower extremity 09/19/2020; patient is again here for wound review of wounds on his right lower leg 09/07/2021: he returns with open wounds on the RLE History of Present Illness (HPI) The patient is Shawn 42 yrs old bm here for evaluation of his right leg ulcer. He has an extensive history of lymphedema and ulcers. He is being treated at the [facility] by Dr. [name] with [name] boots and the [facility]. He has pumps at home but he is only using them once Shawn day. 08/30/14 selective debridement done of surface eschar. The wound cleans up quite nicely in the bed of this looks healthy. He is using Shawn wound VAC under an Unna wrap. 11/17/14; selective debridement done of surface eschar. Once again the wound beds at clean up quite nicely. He is no longer using Shawn wound VAC. Recent dressing changes include Hydrofera Blue. Apparently he has done Shawn wide variety of different topical dressings including advanced treatment options like Apligraf's without success. 03/21/15; surgical debridement done of surface eschar and nonviable subcutaneous tissue. The area on the medial aspect of the right leg has closed and the wound on the lateral right leg looks improved has been using Silver Collagen 04/11/15; I think attendance here is sporadic. The area on the right medial leg remains healed. He has Shawn new tiny area on the back of the right leg. Again Shawn surgical debridement of the surface eschar and nonviable subcutaneous tissue of the major wound on the right lateral leg. He has been using Silver Collagen and at home, he does his own Unna wraps at home edema control seems reasonable. 04/20/15; the area on the right medial leg has Shawn very superficial area which may not even be open nevertheless I felt needed to be dressed today [this is his original chronic wound] the new wound is on the right anterior lateral leg is underwent Shawn surgical  debridement. He has Shawn small wound on the right posterior leg. He puts on his own Unna boots, according to our nurses he does this fairly well. We have been using Silver  collagen. 05/02/2015 -- I understand in the past his venous studies have been done at [facility] and he was advised weight loss before they would attempt to tackle his iliac vein blockage. He has not gone back for review. 06/27/2015 -- we have applied for Apligraf and are awaiting his insurance clearance. 07/25/2015 -- he still has to use her back from his insurance agent regarding his copayment for his Apligraf. 07/31/2015 -- the patient got Shawn reply from the insurance agent regarding the dollar payment for his application but is not sure whether it is for 5 or for one. 11/28/2015 -- has been hurting Shawn little bit more and he has had change in color of his wound especially on the medial part. 12/05/2015 -- his culture was positive for Proteus mirabilis and K Oxytoca which are sensitive to ciprofloxacin which he is already on. 12/12/15oostill on ciprofloxacin. His mother reports of dressing had to be changed last Friday due to odor. He has not been systemically unwell 12/19/15; patient came in today complaining of increasing pain and tightness in his upper right thigh. He completed the ciprofloxacin 2 or 3 days ago. He is not running Shawn fever today. 12/26/2015 -- last week he had been seen by Dr. [name] who got Shawn lower extremity venous duplex evaluation done which did not show DVT or SVT in the right lower extremity. he had also put him on Augmentin in addition to the previous ciprofloxacin and he had received for 2 weeks 01/02/2016 -- -- was admitted to the hospital on 12/26/2015 and discharged on 12/29/2015 and was treated for cellulitis. He was also newly diagnosed with type 2 diabetes mellitus and outpatient monitoring and initiation of treatment was recommended. Patientoos hemoglobin A1c was 6.6 No osteomyelitis detected on x-rays  and recent Doppler study was negative for DVT Oral doxycycline and Levaquin were recommended for the patient as an . outpatient for Shawn 14 day treatment. IV Zosyn and vancomycin was given due to concerns of Pseudomonas infection while he was in hospital. 02/13/2016 -- he has not gone back to [facility] where he had his vascular opinion earlier and I have urged him to regroup with them to see if there is any surgical options available. 02/27/2016 -- has an appointment to see [facility] vascular surgeons on January 3 and may be seeing Dr. Verdie Drown 03/19/2016 -- I was not able to find any notes on Epic but the patient did go to the vascular clinic at [facility] and saw the PA to Dr. Verdie Drown. I understand she has recommended Shawn CT scan and MRI to be done on February 1 and they would review this with him on the same day. 04/09/2016 -- was admitted to the hospital on 03/20/2016 acute respiratory failure with hypoxia, abdominal discomfort with erythema, hypertensive urgency and chronic wound of the right leg with morbid obesity. he was discharged home on 04/05/2016 and was to continue on IV antibiotics for 18 more days follow-up with the wound center and continue with his cardiologist. he has lost approximately 130 pounds and diuresed over 48 L. He was treated with IV Zosyn and ID recommended he continue this for 3 weeks more. The notes from [facility] wound clinic were noted where he was seen by the PA and she had taken cultures which grew MSSA and Pseudomonas. She had recommended edema control with Shawn 4-layer compression and also referred him to the lymphedema clinic. Shawn CT venogram was also planned for the future.  04/16/2016 -- at [facility] Shawn CT pelvic venogram runoff was done on 04/11/2016 -- IMPRESSION:-Limited study secondary to poor opacification of venous structures. No definite evidence of acute deep vein thrombosis. 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 -Extensive right iliac chain and right inguinal adenopathy, favored to be reactive/secondary to congestion. The patient continues on his IV antibiotics through his PICC line and has his cardiology opinion and also Shawn bariatric surgery opinion coming up. 04/30/2016 -- He has completed his IV antibiotics through his PICC line. 05/14/2016 --he was seen by the PA, Ms Sluss, recommended alternate day dressing with compression and use Hibiclens and Dakin's solution with acetic acid on his wounds. 07/30/2016 -- he has still not got his custom-made compression stockings and I have urged him to go and get him self measured for these. 08/06/2016 -- he has been measured for his custom stocking and will get in 2 weeks. He has lost 20 pounds since March 08/27/2016 -- he has not got his custom stockings yet and he tried another pair which has not fit well and he has had Shawn lot of maceration increase the size of his wound. 09/03/2016 -- the patient says that he has not been very compliant with his dressing changes and his elevation and exercise and this week he is notices wound get rather large with maceration. 09/18/16; according to our intake nurse the measurements on this patient's wound are slightly larger. I note previous compliance concerns. I note that his wounds measured larger last week which was noted by Dr. Meyer Russel. Culture done last week was negative. 10/01/2016 -- the patient has received some lymphedema pumps which are new and he says he is being compliant with this. He is going to be away for 2 weeks on vacation to [facility] 10/15/2016 -- he returns after 2 weeks and tells me he has been diligent with his dressing and has lost 25 pounds over the last 3 months. 10/22/2016 -- his last hemoglobin A1c was 6.2 and he is now diet controlled. 11/19/2016 -- he has been having problems with his compression stockings which are custom made at  [facility]. He has not yet been able to get them. He had taken out his compression because he had gone there and had no dressing on when he came here today READMISSION 05/22/2018 [name] is Shawn now 41 year old man with Shawn Morand history of wounds in his lower extremities secondary to chronic venous insufficiency with secondary lymphedema. He has had previous vein ligations on the right although I do not have this information in front of me. 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 He tells me over the last 2 months he has noted mid reopening in the right mid anterior lower extremity. This is Shawn rectangular shaped wound which is kind of odd in terms of how that formed. He has been using silver alginate and Unna boots that we used on him when he was here in 2018. He buys his product supply online thinks this is cheaper than ordering through intermediary's Past medical history; CHF, morbid obesity, hypertension, hyperlipidemia, chronic venous insufficiency, vein bypass in 2005 previous vein ablations or ligations. He has Shawn stent in the right lower extremity. ABI in this clinic was 1.04. He is using his compression pumps at home 3/27; 2-week follow-up. Right lower extremity wounds  related to chronic venous insufficiency and lymphedema. He has been using silver alginate under an Radio broadcast assistant. He change the [employer] himself at home last week. He is making nice progress 4/10; 2-week follow-up. Right lower extremity wound is just about closed Shawn small open area in the middle of the and large rectangular wound he had at the beginning. His edema control is excellent. He says he is using his compression pumps religiously 4/17; 2-week follow-up. Right lower extremity wound is totally closed. He had Shawn large rectangular wound which is progressively closed. His edema control is very good. He is using his compression pumps once to twice Shawn  day READMISSION 09/19/2020 [name] is now Shawn 41 year old man we have had for several stays in this clinic including 2017, 2018 and most recently in 2020 with Shawn wound on his right lower leg. He has compression pumps which he claims to be using twice Shawn day. He also has stockings however I am not sure how much he uses them. Things have broken down over the last month or 2 he has an extensive wound area on the right mid tibial area I think this is similar to what he has had in the past. He has been using Xeroform and an Ace wrap that his girlfriend is applying. Past medical history; the patient has known chronic venous insufficiency with secondary lymphedema. He had an iliac vein blockage and he had Shawn bypass at [facility] although the patient is not followed up with them. The surgeon may have been Dr. Jacolyn Reedy. As noted he has lymphedema pumps. Type 2 diabetes congestive heart failure obstructive sleep apnea. He has Shawn history of Shawn right leg DVT although this may have been the iliac vein he is not currently on anticoagulation ABI in our clinic is 1.37 on the right Imaging Results - in this encounter CT Pelvic Venogram Run Off Imaging Results - CT Pelvic Venogram Run Off Impressions Performed At -Limited study secondary to poor opacification of venous structures. Nodefinite evidence of acute deep vein thrombosis. 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 -Extensive right iliac chain and right inguinal adenopathy, favored to bereactive/secondary to congestion. [facility] Imaging Results - CT Pelvic Venogram Run Off Narrative Performed At EXAM: CT PELVIC VENOGRAM RUN OFF DATE: 04/11/2016 1:28 PM ACCESSION: [id number] UN DICTATED: 04/11/2016 2:18 PM INTERPRETATION LOCATION: [facility] CLINICAL INDICATION: 41 years old Male with h/o RLE DVT and massive lymphedema of both LE's. Eval for venous outflow obstruction vs  extrinsic mass- L97.203-Skin ulcer of calf with necrosis of muscle, unspecified laterality(RAF-HCC) COMPARISON: CT abdomen pelvis dated 02/11/2007 TECHNIQUE: Shawn spiral CT scan was obtained approximately 3 minutes after administration of IV contrast from the kidneys to the knees. Images were reconstructed in the axial plane. Multiplanar reformatted and MIP images were provided for further evaluation of the vessels. For selected cases, 3D volumerendered images are also provided. VASCULAR FINDINGS: There is overall poor contrast opacification of the venous structures, limitingevaluation. IVC: No evidence of thrombus. Iliac veins: The right iliac veins are chronically occluded/diminutive in size. There are extensive venous collaterals noted throughout the right pelvis extending into the right lower extremity. The number and caliber of the venous collaterals have increased when compared to 02/11/2007. The left iliac veins appear patent. There are also small caliber venous collaterals within the left pelvis extending into the left lower extremity. Anterior abdominal wall venouscollaterals are also noted but fully imaged secondary to  patient diameter. The bilateral femoral and popliteal veins appear patent. There is no evidence of acute thrombus. The arterial vasculature is unremarkable on this venous phase time study. NONVASCULAR FINDINGS: LOWER CHEST Unremarkable. : ABDOMEN: HEPATOBILIARY: Unremarkable liver. No biliary ductal dilatation. Gallbladder isremarkable. PANCREAS: Unremarkable. SPLEEN: Unremarkable. ADRENAL GLANDS: Unremarkable. KIDNEYS/URETERS: Unremarkable. BLADDER: Unremarkable. BOWEL/PERITONEUM/RETROPERITONEUM: No bowel obstruction. No acute inflammatoryprocess. No ascites. LYMPH NODES: Extensive right iliac chain and right inguinal adenopathy. Shawn nodal conglomerate in the right inguinal region measures 6.8 x 3.4 cm (2:124). This is only slightly larger when compared to 02/11/2007.  Given interval stabilityover 10 years, these are favored to be reactive/secondary to congestion. REPRODUCTIVE: Unremarkable. BONES/SOFT TISSUES: No acute osseous abnormalities. Postsurgical changes in the right inguinal and left medial thigh region. Right greater than left softtissue edema throughout the lower extremities. [facility] Imaging Results - CT Pelvic Venogram Run Off Procedure Note Interface, Rad Results In - 04/11/2016 3:28 PM EST EXAM: CT PELVIC VENOGRAM RUN OFF DATE: 04/11/2016 1:28 PM ACCESSION: [id number] UN DICTATED: 04/11/2016 2:18 PM INTERPRETATION LOCATION: [facility] CLINICAL INDICATION: 41 years old Male with h/o RLE DVT and massive lymphedema of both LE's. Eval for venous outflow obstruction vs extrinsic mass- L97.203-Skin ulcer of calf with necrosis of muscle, unspecified laterality (RAF-HCC) COMPARISON: CT abdomen pelvis dated 02/11/2007 TECHNIQUE: Shawn spiral CT scan was obtained approximately 3 minutes after administration of IV contrast from the kidneys to the knees. Images were reconstructed in the axial plane. Multiplanar reformatted and MIP images were provided for further evaluation of the vessels. For selected cases, 3D volume rendered images are also provided. VASCULAR FINDINGS: There is overall poor contrast opacification of the venous structures, limiting evaluation. IVC: No evidence of thrombus. Iliac veins: The right iliac veins are chronically occluded/diminutive in size. There are extensive venous collaterals noted throughout the right pelvis extending into the right lower extremity. The number and caliber of the venous collaterals have increased when compared to 02/11/2007. The left iliac veins appear patent. There are also small caliber venous collaterals within the left pelvis extending into the left lower extremity. Anterior abdominal wall venous collaterals are also noted but fully imaged secondary to patient diameter. The bilateral femoral and popliteal  veins appear patent. There is no evidence of acute thrombus. The arterial vasculature is unremarkable on this venous phase time study. NONVASCULAR FINDINGS: LOWER CHEST Unremarkable. : ABDOMEN: HEPATOBILIARY: Unremarkable liver. No biliary ductal dilatation. Gallbladder is remarkable. PANCREAS: Unremarkable. SPLEEN: Unremarkable. ADRENAL GLANDS: Unremarkable. KIDNEYS/URETERS: Unremarkable. BLADDER: Unremarkable. BOWEL/PERITONEUM/RETROPERITONEUM: No bowel obstruction. No acute inflammatory process. No ascites. LYMPH NODES: Extensive right iliac chain and right inguinal adenopathy. Shawn nodal conglomerate in the right inguinal region measures 6.8 x 3.4 cm (2:124). This is only slightly larger when compared to 02/11/2007. Given interval stability over 10 years, these are favored to be reactive/secondary to congestion. REPRODUCTIVE: Unremarkable. BONES/SOFT TISSUES: No acute osseous abnormalities. Postsurgical changes in the right inguinal and left medial thigh region. Right greater than left soft tissue edema throughout the lower extremities. IMPRESSION: -Limited study secondary to poor opacification of venous structures. No definite evidence of acute deep vein thrombosis. 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 -Extensive right iliac chain and right inguinal adenopathy, favored to be reactive/secondary to congestion. Imaging Results - CT Pelvic Venogram Run Off Performing Organization Address City/State/Zipcode Phone Number [facility] [name] [place], [place] [address] Surgical summary as listed below; Assessment: [name] is Shawn 41 y.o.  male with Schonberg history of right lower extremity chronic obstructive deep venous disease with recurrent ulceration, absence of right iliac vein, prior left to right femoral vein bypass. Patient continues with recurrent ulceration, his weight precludes him from  further vascular studies at this time. If he is able to lose 50-100 pounds we would be able to do left lower extremity venogram possible intervention with 50% chance of re- opening occlusion per Dr. Jacolyn Reedy. For global health, weight loss, potential improvement in his chronic venous insufficiency would appreciateinput from bariatric surgery group for recommendations. 09/26/20; patient comes in today with not much improvement. He has also some odor. 3 open areas and this linear area in his mid calf require debridement. We have been using silver alginate under compression. I gave him Keflex last week for what I thought was cellulitis in the right medial thigh he is completing this and I think this is better. 7/26; patient comes in today with again necrotic debris over these wounds. According to our intake nurse extreme malodor and drainage. We have been using silver alginate under compression. He is complaining about pain in the wound area. 7/29; Patient presents for follow-up. He reports improvement to the wound. He has finished taking Keflex. He has no issues or complaints today. 10/17/2020 upon evaluation today patient appears to be doing poorly in regard to his leg ulcer. He has been tolerating the dressing changes without complication. Fortunately there does not appear to be any signs of active infection at this time. No fevers, chills, nausea, vomiting, or diarrhea. 8/23; the patient has 2 areas anteriorly and Shawn larger area medially on the right lower leg. He comes in today with his compression wrap slipping down. We do not have good edema control. We have been using silver alginate. I believe he completed Shawn course of antibioticso Levaquin given to him 2 weeks ago at his last visit. He states his girlfriend who is Shawn nurse changes his dressings. We have not seen this in 2 weeks. He just received Shawn compounded topical antibiotic based on I believe Shawn deep tissue culture that was done. I do not have Shawn  result of the deep tissue culture today and he forgot the compounded antibiotic. I will try to clarify this next time I tried to send him back to vascular at [facility] for review of his central venous obstruction although they would not even see him I think because of noncompliance, or at least perceived noncompliance with regards to the recommendations for weight loss, consideration of bariatric assessment for surgery etc. 8/30; although the patient's wounds anteriorly on the right lower leg extending medially were debrided today. Better looking wound surface he has better edema control still under 4-layer compression. He tells me he is using his compression pump twice Shawn day however they are greater than 59 years old and he might benefit from Shawn new set. He is using his Jodie Echevaria compounded antibiotic Since the last time he was here he saw Dr. Durwin Nora at vein and vascular at our request. He was felt to have adequate arterial flow for healing although there was still the issue about whether he might need an angiogram if things still. 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 He did not talk about the s previous central venous procedures that have been done at [facility]. That information is available in care everywhere however based on the duplex exam he was not felt to be Shawn candidate for any venous procedures  on the right. He has no significant venous reflux on the right and has had previously had Shawn right greater saphenous vein ablated 9/6; the patient has Shawn cluster of small areas over the right anterior mid tibia as well as an area medially. That the air 1 medially is more confluent all of them look like they have Shawn better surface. We have been putting him in 4-layer compression and using his compression pumps twice Shawn day 9/13; both of the patient's wound areas appear somewhat better especially the large area medially. 9/27; 2-week follow-up. His wife who is Shawn Designer, jewellery has been changing  his 4-layer compression with Hydrofera Blue and [place] I think the measurements of the 2 remaining areas 1 on the right anterior tibia and 1 on the right lateral are better. In another setting I might consider advanced treatment product here. He is using his compression pumps 10/11; patient presents for follow-up. He has no issues or complaints today. His girlfriend continues to change his compression wrap with Hydrofera Blue and [place]. He denies signs of infection. 10/25; patient presents for follow-up. He reports Shawn new wound to the right lateral aspect. He states the area was itching and he scratched developing Shawn wound. He denies signs of infection. 11/11; patient presents for follow-up. He no longer has Shawn wound to the right lateral aspect. He had to use an Radio broadcast assistant for the past week due to short supply. He has no issues or complaints today. He denies signs of infection. READMISSION 09/07/2021: The patient is back with new wounds on his right lower extremity. He says that they first emerged around the first of the year. He was seen in the emergency department on June 22 for angioedema either secondary to his ARB or Shawn new lotion that he tried. He was given prednisone. At the same time, the provider in the ER ordered doxycycline to cover his leg ulcer and applied Shawn Unna boot. He did not have an Radio broadcast assistant on in clinic today but his significant other and he have been applying Neosporin with Kerlix and Coban wrapping. He denies any fevers or chills. No pain. No significant odor. All of the wounds have the fat layer exposed and there is slough buildup on all open surfaces. Patient History Information obtained from Patient. Allergies peanut, lidocaine (Reaction: burns) Family History Diabetes - Mother, No family history of Cancer, Heart Disease, Hereditary Spherocytosis, Hypertension, Kidney Disease, Lung Disease, Seizures, Stroke, Thyroid Problems, Tuberculosis. Social History Never smoker,  Marital Status - Single, Alcohol Use - Rarely, Drug Use - No History, Caffeine Use - Moderate - tea. Medical History Eyes Denies history of Cataracts, Glaucoma, Optic Neuritis Ear/Nose/Mouth/Throat Denies history of Chronic sinus problems/congestion, Middle ear problems Hematologic/Lymphatic Patient has history of Lymphedema - right leg Denies history of Anemia, Hemophilia, Human Immunodeficiency Virus, Sickle Cell Disease Respiratory Denies history of Aspiration, Asthma, Chronic Obstructive Pulmonary Disease (COPD), Pneumothorax, Sleep Apnea, Tuberculosis Cardiovascular Patient has history of Deep Vein Thrombosis - hx right leg, Hypertension, Peripheral Venous Disease - venous insufficiency and varicosities Denies history of Angina, Arrhythmia, Congestive Heart Failure, Coronary Artery Disease, Hypotension, Myocardial Infarction, Peripheral Arterial Disease, Phlebitis, Vasculitis Gastrointestinal Denies history of Cirrhosis , Colitis, Crohnoos, Hepatitis Shawn, Hepatitis B, Hepatitis C Endocrine Patient has history of Type II Diabetes Denies history of Type I Diabetes Genitourinary Denies history of End Stage Renal Disease Immunological Denies history of Lupus Erythematosus, Raynaudoos, Scleroderma Integumentary (Skin) Denies history of History of Burn Musculoskeletal Denies history of Gout,  Rheumatoid Arthritis, Osteoarthritis, Osteomyelitis Neurologic Denies history of Dementia, Neuropathy, Quadriplegia, Paraplegia, Seizure Disorder Oncologic Denies history of Received Chemotherapy, Received Radiation Psychiatric Denies history of Anorexia/bulimia, Confinement Anxiety Hospitalization/Surgery History - tonsillectomy. - right knee ligament repair. Medical Shawn Surgical History Notes nd Constitutional Symptoms (General Health) morbid obesity Objective Constitutional He is hypertensive but asymptomatic.. Super morbid obesity. No acute distress. Vitals Time Taken: 8:26 AM, Height:  74 in, Source: Stated, Weight: 450 lbs, Source: Stated, BMI: 57.8, Temperature: 98.5 F, Pulse: 86 bpm, Respiratory Rate: 20 breaths/min, Blood Pressure: 151/91 mmHg. Respiratory Normal work of breathing on room air.. Cardiovascular Skin changes consistent with longstanding lymphedema.. General Notes: 09/07/2021: There are 4 main ulcers. All of them have slough accumulation on the surface. No significant odor or purulent drainage. All of them have the fat layer exposed. Integumentary (Hair, Skin) Wound #36 status is Open. Original cause of wound was Gradually Appeared. The date acquired was: 03/12/2021. The wound is located on the Right,Posterior Lower Leg. The wound measures 5cm length x 4.2cm width x 0.6cm depth; 16.493cm^2 area and 9.896cm^3 volume. There is Fat Layer (Subcutaneous Tissue) exposed. There is no tunneling or undermining noted. There is Shawn medium amount of serosanguineous drainage noted. The wound margin is distinct with the outline attached to the wound base. There is medium (34-66%) pink granulation within the wound bed. There is Shawn medium (34-66%) amount of necrotic tissue within the wound bed including Adherent Slough. Wound #37 status is Open. Original cause of wound was Gradually Appeared. The date acquired was: 03/12/2021. The wound is located on the Right,Medial Lower Leg. The wound measures 4cm length x 2.5cm width x 0.3cm depth; 7.854cm^2 area and 2.356cm^3 volume. There is Fat Layer (Subcutaneous Tissue) exposed. There is no tunneling or undermining noted. There is Shawn medium amount of serosanguineous drainage noted. The wound margin is distinct with the outline attached to the wound base. There is medium (34-66%) red granulation within the wound bed. There is Shawn medium (34-66%) amount of necrotic tissue within the wound bed including Adherent Slough. Wound #38 status is Open. Original cause of wound was Gradually Appeared. The date acquired was: 03/12/2021. The wound is located  on the Right,Anterior Lower Leg. The wound measures 1.1cm length x 1.1cm width x 0.2cm depth; 0.95cm^2 area and 0.19cm^3 volume. There is Fat Layer (Subcutaneous Tissue) exposed. There is no tunneling or undermining noted. There is Shawn medium amount of serosanguineous drainage noted. The wound margin is distinct with the outline attached to the wound base. There is small (1-33%) red granulation within the wound bed. There is Shawn large (67-100%) amount of necrotic tissue within the wound bed including Adherent Slough. Wound #39 status is Open. Original cause of wound was Gradually Appeared. The date acquired was: 03/12/2021. The wound is located on the Right,Lateral Lower Leg. The wound measures 2cm length x 4.2cm width x 0.2cm depth; 6.597cm^2 area and 1.319cm^3 volume. There is Fat Layer (Subcutaneous Tissue) exposed. There is no tunneling or undermining noted. There is Shawn medium amount of serosanguineous drainage noted. The wound margin is distinct with the outline attached to the wound base. There is small (1-33%) red granulation within the wound bed. There is Shawn large (67-100%) amount of necrotic tissue within the wound bed including Eschar and Adherent Slough. Wound #39 status is Open. Original cause of wound was Gradually Appeared. The date acquired was: 03/12/2021. The wound is located on the Right,Lateral Lower Leg. The wound measures 2cm length x 4.2cm width x 0.2cm depth;  6.597cm^2 area and 1.319cm^3 volume. There is Fat Layer (Subcutaneous Tissue) exposed. There is no tunneling or undermining noted. There is Shawn medium amount of serosanguineous drainage noted. The wound margin is distinct with the outline attached to the wound base. There is small (1-33%) red granulation within the wound bed. There is Shawn large (67-100%) amount of necrotic tissue within the wound bed including Eschar and Adherent Slough. Assessment Active Problems ICD-10 Non-pressure chronic ulcer of other part of right lower leg with  fat layer exposed Heart failure, unspecified Essential (primary) hypertension Type 2 diabetes mellitus with other skin ulcer Morbid (severe) obesity due to excess calories Lymphedema, not elsewhere classified Procedures Wound #36 Pre-procedure diagnosis of Wound #36 is Shawn Venous Leg Ulcer located on the Right,Posterior Lower Leg .Severity of Tissue Pre Debridement is: Fat layer exposed. There was Shawn Excisional Skin/Subcutaneous Tissue Debridement with Shawn total area of 21 sq cm performed by Duanne Guess, MD. With the following instrument(s): Curette to remove Non-Viable tissue/material. Material removed includes Subcutaneous Tissue and Slough and. 1 specimen was taken by Shawn Tissue Culture and sent to the lab per facility protocol. Shawn time out was conducted at 09:01, prior to the start of the procedure. Shawn Minimum amount of bleeding was controlled with Pressure. The procedure was tolerated well with Shawn pain level of 0 throughout and Shawn pain level of 0 following the procedure. Post Debridement Measurements: 5cm length x 4.2cm width x 0.6cm depth; 9.896cm^3 volume. Character of Wound/Ulcer Post Debridement is improved. Severity of Tissue Post Debridement is: Fat layer exposed. Post procedure Diagnosis Wound #36: Same as Pre-Procedure Pre-procedure diagnosis of Wound #36 is Shawn Venous Leg Ulcer located on the Right,Posterior Lower Leg . There was Shawn Four Layer Compression Therapy Procedure by Karie Schwalbe, RN. Post procedure Diagnosis Wound #36: Same as Pre-Procedure Wound #37 Pre-procedure diagnosis of Wound #37 is Shawn Venous Leg Ulcer located on the Right,Medial Lower Leg .Severity of Tissue Pre Debridement is: Fat layer exposed. There was Shawn Excisional Skin/Subcutaneous Tissue Debridement with Shawn total area of 10 sq cm performed by Duanne Guess, MD. With the following instrument(s): Curette to remove Non-Viable tissue/material. Material removed includes Subcutaneous Tissue and Slough and. No  specimens were taken. Shawn time out was conducted at 09:01, prior to the start of the procedure. Shawn Minimum amount of bleeding was controlled with Pressure. The procedure was tolerated well with Shawn pain level of 0 throughout and Shawn pain level of 0 following the procedure. Post Debridement Measurements: 4cm length x 2.5cm width x 0.3cm depth; 2.356cm^3 volume. Character of Wound/Ulcer Post Debridement is improved. Severity of Tissue Post Debridement is: Fat layer exposed. Post procedure Diagnosis Wound #37: Same as Pre-Procedure Pre-procedure diagnosis of Wound #37 is Shawn Venous Leg Ulcer located on the Right,Medial Lower Leg . There was Shawn Four Layer Compression Therapy Procedure by Karie Schwalbe, RN. Post procedure Diagnosis Wound #37: Same as Pre-Procedure Wound #38 Pre-procedure diagnosis of Wound #38 is Shawn Venous Leg Ulcer located on the Right,Anterior Lower Leg .Severity of Tissue Pre Debridement is: Fat layer exposed. There was Shawn Excisional Skin/Subcutaneous Tissue Debridement with Shawn total area of 1.21 sq cm performed by Duanne Guess, MD. With the following instrument(s): Curette to remove Non-Viable tissue/material. Material removed includes Subcutaneous Tissue and Slough and. No specimens were taken. Shawn time out was conducted at 09:01, prior to the start of the procedure. Shawn Minimum amount of bleeding was controlled with Pressure. The procedure was tolerated well with Shawn pain level of  0 throughout and Shawn pain level of 0 following the procedure. Post Debridement Measurements: 1.1cm length x 1.1cm width x 0.2cm depth; 0.19cm^3 volume. Character of Wound/Ulcer Post Debridement is improved. Severity of Tissue Post Debridement is: Fat layer exposed. Post procedure Diagnosis Wound #38: Same as Pre-Procedure Pre-procedure diagnosis of Wound #38 is Shawn Venous Leg Ulcer located on the Right,Anterior Lower Leg . There was Shawn Four Layer Compression Therapy Procedure by Karie Schwalbe, RN. Post procedure  Diagnosis Wound #38: Same as Pre-Procedure Wound #39 Pre-procedure diagnosis of Wound #39 is Shawn Venous Leg Ulcer located on the Right,Lateral Lower Leg .Severity of Tissue Pre Debridement is: Fat layer exposed. There was Shawn Excisional Skin/Subcutaneous Tissue Debridement with Shawn total area of 8.4 sq cm performed by Duanne Guess, MD. With the following instrument(s): Curette to remove Non-Viable tissue/material. Material removed includes Subcutaneous Tissue and Slough and. No specimens were taken. Shawn time out was conducted at 09:01, prior to the start of the procedure. Shawn Minimum amount of bleeding was controlled with Pressure. The procedure was tolerated well with Shawn pain level of 0 throughout and Shawn pain level of 0 following the procedure. Post Debridement Measurements: 2cm length x 4.2cm width x 0.2cm depth; 1.319cm^3 volume. Character of Wound/Ulcer Post Debridement is improved. Severity of Tissue Post Debridement is: Fat layer exposed. Post procedure Diagnosis Wound #39: Same as Pre-Procedure Pre-procedure diagnosis of Wound #39 is Shawn Venous Leg Ulcer located on the Right,Lateral Lower Leg . There was Shawn Four Layer Compression Therapy Procedure by Karie Schwalbe, RN. Post procedure Diagnosis Wound #39: Same as Pre-Procedure Plan 09/07/2021: This is Shawn 41 year old man who has been Shawn frequent patient in this clinic for venous ulcers. He has developed new ulcers since he was last seen. There are 4 main ulcers. All of them have slough accumulation on the surface. No significant odor or purulent drainage. All of them have the fat layer exposed. I used Shawn curette to debride slough and nonviable subcutaneous tissue from all of the wounds. I also took Shawn PCR culture to determine whether or not he needed Shawn topical [place]stone compound. For now, we will use silver alginate and 4-layer compression. He will complete the course of doxycycline prescribed in the emergency room. Follow-up in 1 week. Electronic  Signature(s) Signed: 09/07/2021 9:16:04 AM By: Duanne Guess MD FACS Entered By: Duanne Guess on 09/07/2021 09:16:04 -------------------------------------------------------------------------------- HxROS Details Patient Name: Date of Service: [name], [name]. 09/07/2021 8:00 [name] Medical Record Number: [id number] Patient Account Number: [id number] Date of Birth/Sex: Treating RN: 1980/09/14 (41 y.o. Dianna Limbo Primary Care Provider: Georganna Skeans Other Clinician: Referring Provider: Treating Provider/Extender: Kathreen Cosier in Treatment: 0 Information Obtained From Patient Constitutional Symptoms (General Health) Medical History: Past Medical History Notes: morbid obesity Eyes Medical History: Negative for: Cataracts; Glaucoma; Optic Neuritis Ear/Nose/Mouth/Throat Medical History: Negative for: Chronic sinus problems/congestion; Middle ear problems Hematologic/Lymphatic Medical History: Positive for: Lymphedema - right leg Negative for: Anemia; Hemophilia; Human Immunodeficiency Virus; Sickle Cell Disease Respiratory Medical History: Negative for: Aspiration; Asthma; Chronic Obstructive Pulmonary Disease (COPD); Pneumothorax; Sleep Apnea; Tuberculosis Cardiovascular Medical History: Positive for: Deep Vein Thrombosis - hx right leg; Hypertension; Peripheral Venous Disease - venous insufficiency and varicosities Negative for: Angina; Arrhythmia; Congestive Heart Failure; Coronary Artery Disease; Hypotension; Myocardial Infarction; Peripheral Arterial Disease; Phlebitis; Vasculitis Gastrointestinal Medical History: Negative for: Cirrhosis ; Colitis; Crohns; Hepatitis Shawn; Hepatitis B; Hepatitis C Endocrine Medical History: Positive for: Type II Diabetes Negative for: Type I  Diabetes Time with diabetes: 3 years Treated with: Oral agents, Diet Blood sugar tested every day: No Genitourinary Medical History: Negative for: End Stage  Renal Disease Immunological Medical History: Negative for: Lupus Erythematosus; Raynauds; Scleroderma Integumentary (Skin) Medical History: Negative for: History of Burn Musculoskeletal Medical History: Negative for: Gout; Rheumatoid Arthritis; Osteoarthritis; Osteomyelitis Neurologic Medical History: Negative for: Dementia; Neuropathy; Quadriplegia; Paraplegia; Seizure Disorder Oncologic Medical History: Negative for: Received Chemotherapy; Received Radiation Psychiatric Medical History: Negative for: Anorexia/bulimia; Confinement Anxiety Immunizations Pneumococcal Vaccine: Received Pneumococcal Vaccination: No Implantable Devices None Hospitalization / Surgery History Type of Hospitalization/Surgery tonsillectomy right knee ligament repair Family and Social History Cancer: No; Diabetes: Yes - Mother; Heart Disease: No; Hereditary Spherocytosis: No; Hypertension: No; Kidney Disease: No; Lung Disease: No; Seizures: No; Stroke: No; Thyroid Problems: No; Tuberculosis: No; Never smoker; Marital Status - Single; Alcohol Use: Rarely; Drug Use: No History; Caffeine Use: Moderate - tea; Financial Concerns: No; Food, Clothing or Shelter Needs: No; Support System Lacking: No; Transportation Concerns: No Electronic Signature(s) Signed: 09/07/2021 11:01:22 AM By: Duanne Guess MD FACS Signed: 09/07/2021 5:39:10 PM By: Karie Schwalbe RN Entered By: Karie Schwalbe on 09/07/2021 08:32:35 -------------------------------------------------------------------------------- SuperBill Details Patient Name: Date of Service: [name] 09/07/2021 Medical Record Number: [id number] Patient Account Number: [id number] Date of Birth/Sex: Treating RN: 03-28-1980 (41 y.o. Dianna Limbo Primary Care Provider: Georganna Skeans Other Clinician: Referring Provider: Treating Provider/Extender: Kathreen Cosier in Treatment: 0 Diagnosis Coding ICD-10 Codes Code  Description [phone] Non-pressure chronic ulcer of other part of right lower leg with fat layer exposed I50.9 Heart failure, unspecified I10 Essential (primary) hypertension E11.622 Type 2 diabetes mellitus with other skin ulcer E66.01 Morbid (severe) obesity due to excess calories I89.0 Lymphedema, not elsewhere classified Facility Procedures CPT4 Code: 42595638 Description: 99213 - WOUND CARE VISIT-LEV 3 EST PT Modifier: 25 Quantity: 1 CPT4 Code: 75643329 Description: 11042 - DEB SUBQ TISSUE 20 SQ CM/< ICD-10 Diagnosis Description L97.812 Non-pressure chronic ulcer of other part of right lower leg with fat layer expo Modifier: sed Quantity: 1 CPT4 Code: 51884166 Description: 11045 - DEB SUBQ TISS EA ADDL 20CM ICD-10 Diagnosis Description L97.812 Non-pressure chronic ulcer of other part of right lower leg with fat layer expo Modifier: sed Quantity: 2 Physician Procedures : CPT4 Code Description Modifier [id number] 99214 - WC PHYS LEVEL 4 - EST PT 25 ICD-10 Diagnosis Description L97.812 Non-pressure chronic ulcer of other part of right lower leg with fat layer exposed I89.0 Lymphedema, not elsewhere classified E66.01 Morbid  (severe) obesity due to excess calories E11.622 Type 2 diabetes mellitus with other skin ulcer Quantity: 1 : 1093235 11042 - WC PHYS SUBQ TISS 20 SQ CM ICD-10 Diagnosis Description L97.812 Non-pressure chronic ulcer of other part of right lower leg with fat layer exposed Quantity: 1 Electronic Signature(s) Signed: 09/07/2021 5:39:10 PM By: Karie Schwalbe RN Signed: 09/10/2021 7:33:01 AM By: Duanne Guess MD FACS Previous Signature: 09/07/2021 9:16:36 AM Version By: Duanne Guess MD FACS Entered By: Karie Schwalbe on 09/07/2021 17:25:30

## 2021-09-13 ENCOUNTER — Telehealth: Payer: Self-pay | Admitting: *Deleted

## 2021-09-13 NOTE — Telephone Encounter (Signed)
Patient notified sleep study has been completed. CPAP order has been sent to [facility].

## 2021-09-13 NOTE — Telephone Encounter (Signed)
-----   Message from Lennette Bihari, MD sent at 09/13/2021  9:56 AM EDT ----- Burna Mortimer, please notify pt of results and set up with DME for CPAP initiation.

## 2021-09-13 NOTE — Procedures (Signed)
Patient Name: [name], Salvetti Date: 09/02/2021 Gender: Male D.O.B: 1980-10-29 Age (years): 28 Referring Provider: Alver Sorrow NP Height (inches): 74 Interpreting Physician: Nicki Guadalajara MD, ABSM Weight (lbs): 490 RPSGT: Rosette Reveal BMI: 63 MRN: [id number] Neck Size: 20.00  CLINICAL INFORMATION The patient is referred for a CPAP titration to treat sleep apnea.  Date of HST: AHI 56.1/h; supine AHI 86.1/h;  O2 nadir 86.1%  SLEEP STUDY TECHNIQUE As per the AASM Manual for the Scoring of Sleep and Associated Events v2.3 (April 2016) with a hypopnea requiring 4% desaturations.  The channels recorded and monitored were frontal, central and occipital EEG, electrooculogram (EOG), submentalis EMG (chin), nasal and oral airflow, thoracic and abdominal wall motion, anterior tibialis EMG, snore microphone, electrocardiogram, and pulse oximetry. Continuous positive airway pressure (CPAP) was initiated at the beginning of the study and titrated to treat sleep-disordered breathing.  MEDICATIONS carvedilol (COREG) 25 MG tablet doxycycline (VIBRAMYCIN) 100 MG capsule furosemide (LASIX) 80 MG tablet (Expired) gabapentin (NEURONTIN) 300 MG capsule (Expired) glucose blood (ACCU-CHEK AVIVA PLUS) test strip hydrALAZINE (APRESOLINE) 25 MG tablet meloxicam (MOBIC) 15 MG tablet metFORMIN (GLUCOPHAGE) 500 MG tablet metolazone (ZAROXOLYN) 2.5 MG tablet Misc. Devices (SCD SOFT SLEEVES/KNEE LENGTH) MISC predniSONE (DELTASONE) 20 MG tablet spironolactone (ALDACTONE) 25 MG tablet traMADol (ULTRAM) 50 MG tablet  Medications self-administered by patient taken the night of the study : N/A  TECHNICIAN COMMENTS Comments added by technician: None Comments added by scorer: N/A  RESPIRATORY PARAMETERS Optimal PAP Pressure (cm): 18 AHI at Optimal Pressure (/hr): 0 Overall Minimal O2 (%): 81.0 Supine % at Optimal Pressure (%): 38 Minimal O2 at Optimal Pressure (%): 87.0   SLEEP  ARCHITECTURE The study was initiated at 10:59:17 PM and ended at 5:00:34 AM.  Sleep onset time was 3.7 minutes and the sleep efficiency was 89.4%%. The total sleep time was 323.1 minutes.  The patient spent 2.8%% of the night in stage N1 sleep, 72.5%% in stage N2 sleep, 0.0%% in stage N3 and 24.8% in REM.Stage REM latency was 46.0 minutes  Wake after sleep onset was 34.5. Alpha intrusion was absent. Supine sleep was 45.18%.  CARDIAC DATA The 2 lead EKG demonstrated sinus rhythm. The mean heart rate was 64.6 beats per minute. Other EKG findings include: isolated PVC.  LEG MOVEMENT DATA The total Periodic Limb Movements of Sleep (PLMS) were 0. The PLMS index was 0.0. A PLMS index of <15 is considered normal in adults.  IMPRESSIONS - CPAP was initiated at 7 cm and was titrated to optimal PAP pressure at 18 cm of water. At 18 cm snoring resoloved and REM sleep was achieved. (AHI 0; O2 nadir 87%). - Moderate oxygen desaturations were observed to a nadir of 81% at 14 cm. - The patient snored with moderate snoring volume during this titration study. - No significant cardiac abnormalities were observed; rare isolated PVC. - Clinically significant periodic limb movements were not noted during this study. Arousals associated with PLMs were rare.  DIAGNOSIS - Obstructive Sleep Apnea (G47.33)  RECOMMENDATIONS - Recommend an initial trial of CPAP Auto therapy with EPR of 3 at 18 - 20  cm H2O with heated humidification. A Medium size Fisher&Paykel Full Face Simplus mask was used for the titration. - Effort should be made to optinmize nasal and oropharyngeal patency. - Avoid alcohol, sedatives and other CNS depressants that may worsen sleep apnea and disrupt normal sleep architecture. - Sleep hygiene should be reviewed to assess factors that may improve sleep quality. -  Weight management (BMI 63) and regular exercise should be initiated or continued. - Recommend a download and sleep clinic evaluation  after 4 weeks of therapy.   [Electronically signed] 09/13/2021 09:49 AM  Nicki Guadalajara MD, [facility], ABSM Diplomate, American Board of Sleep Medicine  NPI: [id number]  [facility] PH: [phone]   FX: [phone] ACCREDITED BY THE AMERICAN ACADEMY OF SLEEP MEDICINE

## 2021-09-14 ENCOUNTER — Other Ambulatory Visit: Payer: Self-pay | Admitting: Family Medicine

## 2021-09-14 ENCOUNTER — Encounter (HOSPITAL_BASED_OUTPATIENT_CLINIC_OR_DEPARTMENT_OTHER): Payer: Self-pay | Attending: General Surgery | Admitting: General Surgery

## 2021-09-14 DIAGNOSIS — L97812 Non-pressure chronic ulcer of other part of right lower leg with fat layer exposed: Secondary | ICD-10-CM | POA: Insufficient documentation

## 2021-09-14 DIAGNOSIS — I890 Lymphedema, not elsewhere classified: Secondary | ICD-10-CM | POA: Insufficient documentation

## 2021-09-14 DIAGNOSIS — I745 Embolism and thrombosis of iliac artery: Secondary | ICD-10-CM | POA: Insufficient documentation

## 2021-09-14 DIAGNOSIS — I82501 Chronic embolism and thrombosis of unspecified deep veins of right lower extremity: Secondary | ICD-10-CM | POA: Insufficient documentation

## 2021-09-14 DIAGNOSIS — I110 Hypertensive heart disease with heart failure: Secondary | ICD-10-CM | POA: Insufficient documentation

## 2021-09-14 DIAGNOSIS — E11622 Type 2 diabetes mellitus with other skin ulcer: Secondary | ICD-10-CM | POA: Insufficient documentation

## 2021-09-14 DIAGNOSIS — E1151 Type 2 diabetes mellitus with diabetic peripheral angiopathy without gangrene: Secondary | ICD-10-CM | POA: Insufficient documentation

## 2021-09-14 DIAGNOSIS — I509 Heart failure, unspecified: Secondary | ICD-10-CM | POA: Insufficient documentation

## 2021-09-14 NOTE — Telephone Encounter (Signed)
Requested Prescriptions  Pending Prescriptions Disp Refills  . gabapentin (NEURONTIN) 300 MG capsule [Pharmacy Med Name: GABAPENTIN 300 MG CAPSULE] 90 capsule 0    Sig: TAKE 1 CAPSULE BY MOUTH EVERYDAY AT BEDTIME     Neurology: Anticonvulsants - gabapentin Passed - 09/14/2021  3:50 PM      Passed - Cr in normal range and within 360 days    Creat  Date Value Ref Range Status  04/12/2016 1.01 0.60 - 1.35 mg/dL Final   Creatinine, Ser  Date Value Ref Range Status  01/05/2021 0.95 0.76 - 1.27 mg/dL Final         Passed - Completed PHQ-2 or PHQ-9 in the last 360 days      Passed - Valid encounter within last 12 months    Recent Outpatient Visits          8 months ago Uncontrolled hypertension   [facility] at [facility], [place]   9 months ago Atrial fibrillation, unspecified type [facility])   Primary Care at [facility], MD   11 months ago OSA on CPAP   [facility] Care at [facility] [place], NP   1 year ago Essential hypertension   [facility] Care at [facility] [place], [place]   1 year ago Annual physical exam   Primary Care at [facility], Kandee Keen, MD      Future Appointments            In 1 week Chilton Si, MD [facility], [facility]   In 1 month Georganna Skeans, MD [facility] Care at [facility]

## 2021-09-14 NOTE — Progress Notes (Signed)
[name], [name] ([id number]) . Visit Report for 09/14/2021 Arrival Information Details Patient Name: Date of Service: [name] 09/14/2021 1:15 PM Medical Record Number: [id number] Patient Account Number: [id number] Date of Birth/Sex: Treating RN: [birth year] (41 y.o. Dianna Limbo Primary Care Prim Morace: Georganna Skeans Other Clinician: Referring Breckin Savannah: Treating Arma Reining/Extender: Kathreen Cosier in Treatment: 1 Visit Information History Since Last Visit Added or deleted any medications: No Patient Arrived: Ambulatory Any new allergies or adverse reactions: No Arrival Time: 13:36 Had Shawn fall or experienced change in No Accompanied By: self activities of daily living that may affect Transfer Assistance: None risk of falls: Patient Identification Verified: Yes Signs or symptoms of abuse/neglect since last visito No Secondary Verification Process Completed: Yes Hospitalized since last visit: No Patient Requires Transmission-Based Precautions: No Implantable device outside of the clinic excluding No Patient Has Alerts: No cellular tissue based products placed in the center since last visit: Has Dressing in Place as Prescribed: Yes Has Compression in Place as Prescribed: Yes Pain Present Now: No Electronic Signature(s) Signed: 09/14/2021 5:16:37 PM By: Karie Schwalbe RN Entered By: Karie Schwalbe on 09/14/2021 13:55:14 -------------------------------------------------------------------------------- Compression Therapy Details Patient Name: Date of Service: Shawn Bering [name]. 09/14/2021 1:15 PM Medical Record Number: [id number] Patient Account Number: [id number] Date of Birth/Sex: Treating RN: September 25, 1980 (41 y.o. Dianna Limbo Primary Care Leianna Barga: Georganna Skeans Other Clinician: Referring Linell Shawn: Treating Pride Gonzales/Extender: Kathreen Cosier in Treatment: 1 Compression Therapy Performed for Wound Assessment: Wound #36  Right,Posterior Lower Leg Performed By: Clinician Karie Schwalbe, RN Compression Type: Four Layer Post Procedure Diagnosis Same as Pre-procedure Electronic Signature(s) Signed: 09/14/2021 5:16:37 PM By: Karie Schwalbe RN Entered By: Karie Schwalbe on 09/14/2021 14:19:18 -------------------------------------------------------------------------------- Compression Therapy Details Patient Name: Date of Service: [name] 09/14/2021 1:15 PM Medical Record Number: [id number] Patient Account Number: [id number] Date of Birth/Sex: Treating RN: 12-03-1980 (41 y.o. Dianna Limbo Primary Care Keri Tavella: Georganna Skeans Other Clinician: Referring Chauntae Hults: Treating Naveen Clardy/Extender: Kathreen Cosier in Treatment: 1 Compression Therapy Performed for Wound Assessment: Wound #37 Right,Medial Lower Leg Performed By: Clinician Karie Schwalbe, RN Compression Type: Four Layer Post Procedure Diagnosis Same as Pre-procedure Electronic Signature(s) Signed: 09/14/2021 5:16:37 PM By: Karie Schwalbe RN Entered By: Karie Schwalbe on 09/14/2021 14:19:58 -------------------------------------------------------------------------------- Compression Therapy Details Patient Name: Date of Service: [name] 09/14/2021 1:15 PM Medical Record Number: [id number] Patient Account Number: [id number] Date of Birth/Sex: Treating RN: 05-03-1980 (41 y.o. Dianna Limbo Primary Care Leona Pressly: Georganna Skeans Other Clinician: Referring Sekai Nayak: Treating Giselle Brutus/Extender: Kathreen Cosier in Treatment: 1 Compression Therapy Performed for Wound Assessment: Wound #38 Right,Anterior Lower Leg Performed By: Clinician Karie Schwalbe, RN Compression Type: Four Layer Post Procedure Diagnosis Same as Pre-procedure Electronic Signature(s) Signed: 09/14/2021 5:16:37 PM By: Karie Schwalbe RN Entered By: Karie Schwalbe on 09/14/2021  14:20:18 -------------------------------------------------------------------------------- Compression Therapy Details Patient Name: Date of Service: [name] 09/14/2021 1:15 PM Medical Record Number: [id number] Patient Account Number: [id number] Date of Birth/Sex: Treating RN: [birth year] (41 y.o. Dianna Limbo Primary Care Atavia Poppe: Georganna Skeans Other Clinician: Referring Aerion Bagdasarian: Treating Saphia Vanderford/Extender: Kathreen Cosier in Treatment: 1 Compression Therapy Performed for Wound Assessment: Wound #39 Right,Lateral Lower Leg Performed By: Clinician Karie Schwalbe, RN Compression Type: Four Layer Post Procedure Diagnosis Same as Pre-procedure Electronic Signature(s) Signed: 09/14/2021 5:16:37 PM By: Karie Schwalbe RN Entered By: Karie Schwalbe on 09/14/2021  14:20:18 -------------------------------------------------------------------------------- Encounter Discharge Information Details Patient Name: Date of Service: [name] 09/14/2021 1:15 PM Medical Record Number: [id number] Patient Account Number: [id number] Date of Birth/Sex: Treating RN: 19-Nov-1980 (41 y.o. Dianna Limbo Primary Care Erskine Steinfeldt: Georganna Skeans Other Clinician: Referring Lynard Postlewait: Treating Floretta Petro/Extender: Kathreen Cosier in Treatment: 1 Encounter Discharge Information Items Post Procedure Vitals Discharge Condition: Stable Temperature (F): 98.8 Ambulatory Status: Ambulatory Pulse (bpm): 97 Discharge Destination: Home Respiratory Rate (breaths/min): 20 Transportation: Private Auto Blood Pressure (mmHg): 148/100 Accompanied By: grand-daughter Schedule Follow-up Appointment: Yes Clinical Summary of Care: Patient Declined Electronic Signature(s) Signed: 09/14/2021 5:16:37 PM By: Karie Schwalbe RN Entered By: Karie Schwalbe on 09/14/2021  17:13:53 -------------------------------------------------------------------------------- Lower Extremity Assessment Details Patient Name: Date of Service: [name] 09/14/2021 1:15 PM Medical Record Number: [id number] Patient Account Number: [id number] Date of Birth/Sex: Treating RN: January 27, 1981 (41 y.o. Dianna Limbo Primary Care Verley Pariseau: Georganna Skeans Other Clinician: Referring Cade Dashner: Treating Emory Leaver/Extender: Bertrum Sol Weeks in Treatment: 1 Edema Assessment Assessed: [Left: No] [Right: No] Edema: [Left: Ye] [Right: s] Calf Left: Right: Point of Measurement: 32 cm From Medial Instep 52.4 cm Ankle Left: Right: Point of Measurement: 11 cm From Medial Instep 30.6 cm Vascular Assessment Pulses: Dorsalis Pedis Palpable: [Left:Yes] Electronic Signature(s) Signed: 09/14/2021 5:16:37 PM By: Karie Schwalbe RN Entered By: Karie Schwalbe on 09/14/2021 13:42:41 -------------------------------------------------------------------------------- Multi Wound Chart Details Patient Name: Date of Service: Shawn Bering [name]. 09/14/2021 1:15 PM Medical Record Number: [id number] Patient Account Number: [id number] Date of Birth/Sex: Treating RN: Jan 13, 1981 (41 y.o. Dianna Limbo Primary Care Vinal Rosengrant: Georganna Skeans Other Clinician: Referring Estefania Kamiya: Treating Mabry Tift/Extender: Kathreen Cosier in Treatment: 1 Vital Signs Height(in): 74 Pulse(bpm): 97 Weight(lbs): 450 Blood Pressure(mmHg): 148/100 Body Mass Index(BMI): 57.8 Temperature(F): 98.8 Respiratory Rate(breaths/min): 20 Photos: Right, Posterior Lower Leg Right, Medial Lower Leg Right, Anterior Lower Leg Wound Location: Gradually Appeared Gradually Appeared Gradually Appeared Wounding Event: Venous Leg Ulcer Venous Leg Ulcer Venous Leg Ulcer Primary Etiology: Lymphedema, Deep Vein Thrombosis, Lymphedema, Deep Vein Thrombosis, Lymphedema, Deep Vein  Thrombosis, Comorbid History: Hypertension, Peripheral Venous Hypertension, Peripheral Venous Hypertension, Peripheral Venous Disease, Type II Diabetes Disease, Type II Diabetes Disease, Type II Diabetes 03/12/2021 03/12/2021 03/12/2021 Date Acquired: 1 1 1  Weeks of Treatment: Open Open Open Wound Status: No No No Wound Recurrence: No Yes No Clustered Wound: 5.2x3.6x0.4 3.7x1.9x0.3 1x0.5x0.3 Measurements L x W x D (cm) 14.703 5.521 0.393 Shawn (cm) : rea 5.881 1.656 0.118 Volume (cm) : 10.90% 29.70% 58.60% % Reduction in Area: 40.60% 29.70% 37.90% % Reduction in Volume: Full Thickness Without Exposed Full Thickness Without Exposed Full Thickness Without Exposed Classification: Support Structures Support Structures Support Structures N/Shawn Medium Medium Exudate Shawn mount: N/Shawn Serosanguineous Serosanguineous Exudate Type: N/Shawn red, brown red, brown Exudate Color: N/Shawn Distinct, outline attached Distinct, outline attached Wound Margin: N/Shawn Medium (34-66%) Small (1-33%) Granulation Shawn mount: N/Shawn Red Red Granulation Quality: N/Shawn Medium (34-66%) Large (67-100%) Necrotic Shawn mount: N/Shawn Small (1-33%) Small (1-33%) Epithelialization: Wound Number: 39 N/Shawn N/Shawn Photos: N/Shawn N/Shawn Right, Lateral Lower Leg N/Shawn N/Shawn Wound Location: Gradually Appeared N/Shawn N/Shawn Wounding Event: Venous Leg Ulcer N/Shawn N/Shawn Primary Etiology: Lymphedema, Deep Vein Thrombosis, N/Shawn N/Shawn Comorbid History: Hypertension, Peripheral Venous Disease, Type II Diabetes 03/12/2021 N/Shawn N/Shawn Date Acquired: 1 N/Shawn N/Shawn Weeks of Treatment: Open N/Shawn N/Shawn Wound Status: No N/Shawn N/Shawn Wound Recurrence: Yes N/Shawn N/Shawn Clustered Wound: 1.1x0.8x0.2 N/Shawn N/Shawn Measurements L x  W x D (cm) 0.691 N/Shawn N/Shawn Shawn (cm) : rea 0.138 N/Shawn N/Shawn Volume (cm) : 89.50% N/Shawn N/Shawn % Reduction in Area: 89.50% N/Shawn N/Shawn % Reduction in Volume: Full Thickness Without Exposed N/Shawn N/Shawn Classification: Support Structures Medium N/Shawn N/Shawn Exudate Amount: Serosanguineous  N/Shawn N/Shawn Exudate Type: red, brown N/Shawn N/Shawn Exudate Color: Distinct, outline attached N/Shawn N/Shawn Wound Margin: Medium (34-66%) N/Shawn N/Shawn Granulation Amount: Red N/Shawn N/Shawn Granulation Quality: Medium (34-66%) N/Shawn N/Shawn Necrotic Amount: Fat Layer (Subcutaneous Tissue): Yes N/Shawn N/Shawn Exposed Structures: Fascia: No Tendon: No Muscle: No Joint: No Bone: No Small (1-33%) N/Shawn N/Shawn Epithelialization: Treatment Notes Electronic Signature(s) Signed: 09/14/2021 2:03:41 PM By: Duanne Guess MD FACS Signed: 09/14/2021 5:16:37 PM By: Karie Schwalbe RN Entered By: Duanne Guess on 09/14/2021 14:03:41 -------------------------------------------------------------------------------- Multi-Disciplinary Care Plan Details Patient Name: Date of Service: Shawn Bering [name]. 09/14/2021 1:15 PM Medical Record Number: [id number] Patient Account Number: [id number] Date of Birth/Sex: Treating RN: 1981/02/24 (41 y.o. Dianna Limbo Primary Care Jaleen Finch: Georganna Skeans Other Clinician: Referring Greta Yung: Treating Reatha Sur/Extender: Kathreen Cosier in Treatment: 1 Active Inactive Venous Leg Ulcer Nursing Diagnoses: Actual venous Insuffiency (use after diagnosis is confirmed) Goals: Patient will maintain optimal edema control Date Initiated: 09/07/2021 Target Resolution Date: 10/05/2021 Goal Status: Active Interventions: Compression as ordered Treatment Activities: Therapeutic compression applied : 09/07/2021 Notes: Electronic Signature(s) Signed: 09/14/2021 5:16:37 PM By: Karie Schwalbe RN Entered By: Karie Schwalbe on 09/14/2021 13:40:39 -------------------------------------------------------------------------------- Pain Assessment Details Patient Name: Date of Service: [name] 09/14/2021 1:15 PM Medical Record Number: [id number] Patient Account Number: [id number] Date of Birth/Sex: Treating RN: Jun 05, 1980 (41 y.o. Dianna Limbo Primary Care Beaux Verne: Georganna Skeans Other Clinician: Referring Solina Heron: Treating Moris Ratchford/Extender: Kathreen Cosier in Treatment: 1 Active Problems Location of Pain Severity and Description of Pain Patient Has Paino No Site Locations Rate the pain. Current Pain Level: 0 Pain Management and Medication Current Pain Management: Electronic Signature(s) Signed: 09/14/2021 5:16:37 PM By: Karie Schwalbe RN Entered By: Karie Schwalbe on 09/14/2021 13:37:46 -------------------------------------------------------------------------------- Patient/Caregiver Education Details Patient Name: Date of Service: [name] 7/7/2023andnbsp1:15 PM Medical Record Number: [id number] Patient Account Number: [id number] Date of Birth/Gender: Treating RN: [birth year] (41 y.o. Dianna Limbo Primary Care Physician: Georganna Skeans Other Clinician: Referring Physician: Treating Physician/Extender: Kathreen Cosier in Treatment: 1 Education Assessment Education Provided To: Patient Education Topics Provided Wound/Skin Impairment: Methods: Explain/Verbal Responses: Reinforcements needed, State content correctly Electronic Signature(s) Signed: 09/14/2021 5:16:37 PM By: Karie Schwalbe RN Entered By: Karie Schwalbe on 09/14/2021 13:40:52 -------------------------------------------------------------------------------- Wound Assessment Details Patient Name: Date of Service: [name] 09/14/2021 1:15 PM Medical Record Number: [id number] Patient Account Number: [id number] Date of Birth/Sex: Treating RN: 02/05/1981 (41 y.o. Dianna Limbo Primary Care Daylin Eads: Georganna Skeans Other Clinician: Referring Pietra Zuluaga: Treating Toben Acuna/Extender: Kathreen Cosier in Treatment: 1 Wound Status Wound Number: 36 Primary Venous Leg Ulcer Etiology: Wound Location: Right, Posterior Lower Leg Wound Open Wounding Event: Gradually Appeared Status: Date  Acquired: 03/12/2021 Comorbid Lymphedema, Deep Vein Thrombosis, Hypertension, Peripheral Weeks Of Treatment: 1 History: Venous Disease, Type II Diabetes Clustered Wound: No Photos Wound Measurements Length: (cm) 5.2 Width: (cm) 3.6 Depth: (cm) 0.4 Area: (cm) 14.703 Volume: (cm) 5.881 % Reduction in Area: 10.9% % Reduction in Volume: 40.6% Wound Description Classification: Full Thickness Without Exposed Support Structur es Treatment Notes Wound #36 (Lower Leg) Wound Laterality: Right, Posterior Cleanser Normal Saline  Discharge Instruction: Cleanse the wound with Normal Saline prior to applying Shawn clean dressing using gauze sponges, not tissue or cotton balls. Soap and Water Discharge Instruction: May shower and wash wound with dial antibacterial soap and water prior to dressing change. Peri-Wound Care Topical Primary Dressing KerraCel Ag Gelling Fiber Dressing, 4x5 in (silver alginate) Discharge Instruction: Apply silver alginate to wound bed as instructed Secondary Dressing ABD Pad, 8x10 Discharge Instruction: Apply over primary dressing as directed. Woven Gauze Sponge, Non-Sterile 4x4 in Discharge Instruction: Apply over primary dressing as directed. Secured With Paper Tape, 2x10 (in/yd) Discharge Instruction: Secure dressing with tape as directed. Compression Wrap FourPress (4 layer compression wrap) Discharge Instruction: Apply four layer compression as directed. May also use Miliken CoFlex 2 layer compression system as alternative. Compression Stockings Add-Ons Electronic Signature(s) Signed: 09/14/2021 5:16:37 PM By: Karie SchwalbeScotton, Joanne RN Entered By: Karie SchwalbeScotton, Joanne on 09/14/2021 13:52:36 -------------------------------------------------------------------------------- Wound Assessment Details Patient Name: Date of Service: [name], Shawn DRIA N [name]. 09/14/2021 1:15 PM Medical Record Number: [id number] Patient Account Number: [id number] Date of Birth/Sex: Treating  RN: 1980/04/13 (41 y.o. Dianna LimboM) Scotton, Joanne Primary Care Lakyla Biswas: Georganna SkeansWilson, Amelia Other Clinician: Referring Kyrian Stage: Treating Ulyssa Walthour/Extender: Bertrum Solannon, Jennifer Wilson, Amelia Weeks in Treatment: 1 Wound Status Wound Number: 37 Primary Venous Leg Ulcer Etiology: Wound Location: Right, Medial Lower Leg Wound Open Wounding Event: Gradually Appeared Status: Date Acquired: 03/12/2021 Comorbid Lymphedema, Deep Vein Thrombosis, Hypertension, Peripheral Weeks Of Treatment: 1 History: Venous Disease, Type II Diabetes Clustered Wound: Yes Photos Wound Measurements Length: (cm) 3.7 Width: (cm) 1.9 Depth: (cm) 0.3 Area: (cm) 5.521 Volume: (cm) 1.656 % Reduction in Area: 29.7% % Reduction in Volume: 29.7% Epithelialization: Small (1-33%) Tunneling: No Undermining: No Wound Description Classification: Full Thickness Without Exposed Support Structures Wound Margin: Distinct, outline attached Exudate Amount: Medium Exudate Type: Serosanguineous Exudate Color: red, brown Foul Odor After Cleansing: No Slough/Fibrino Yes Wound Bed Granulation Amount: Medium (34-66%) Exposed Structure Granulation Quality: Red Fascia Exposed: No Necrotic Amount: Medium (34-66%) Fat Layer (Subcutaneous Tissue) Exposed: Yes Necrotic Quality: Adherent Slough Tendon Exposed: No Muscle Exposed: No Joint Exposed: No Bone Exposed: No Treatment Notes Wound #37 (Lower Leg) Wound Laterality: Right, Medial Cleanser Normal Saline Discharge Instruction: Cleanse the wound with Normal Saline prior to applying Shawn clean dressing using gauze sponges, not tissue or cotton balls. Soap and Water Discharge Instruction: May shower and wash wound with dial antibacterial soap and water prior to dressing change. Peri-Wound Care Topical Primary Dressing KerraCel Ag Gelling Fiber Dressing, 4x5 in (silver alginate) Discharge Instruction: Apply silver alginate to wound bed as instructed Secondary Dressing ABD Pad,  8x10 Discharge Instruction: Apply over primary dressing as directed. Woven Gauze Sponge, Non-Sterile 4x4 in Discharge Instruction: Apply over primary dressing as directed. Secured With Paper Tape, 2x10 (in/yd) Discharge Instruction: Secure dressing with tape as directed. Compression Wrap FourPress (4 layer compression wrap) Discharge Instruction: Apply four layer compression as directed. May also use Miliken CoFlex 2 layer compression system as alternative. Compression Stockings Add-Ons Electronic Signature(s) Signed: 09/14/2021 5:16:37 PM By: Karie SchwalbeScotton, Joanne RN Entered By: Karie SchwalbeScotton, Joanne on 09/14/2021 13:53:13 -------------------------------------------------------------------------------- Wound Assessment Details Patient Name: Date of Service: [name], Shawn DRIA N [name]. 09/14/2021 1:15 PM Medical Record Number: [id number] Patient Account Number: [id number] Date of Birth/Sex: Treating RN: 1980/04/13 (41 y.o. Dianna LimboM) Scotton, Joanne Primary Care Kayana Thoen: Georganna SkeansWilson, Amelia Other Clinician: Referring Mollye Guinta: Treating Saraih Lorton/Extender: Bertrum Solannon, Jennifer Wilson, Amelia Weeks in Treatment: 1 Wound Status Wound Number: 38 Primary Venous Leg Ulcer Etiology: Etiology: Wound Location: Right, Anterior  Lower Leg Wound Open Wounding Event: Gradually Appeared Status: Date Acquired: 03/12/2021 Comorbid Lymphedema, Deep Vein Thrombosis, Hypertension, Peripheral Weeks Of Treatment: 1 History: Venous Disease, Type II Diabetes Clustered Wound: No Photos Wound Measurements Length: (cm) 1 Width: (cm) 0.5 Depth: (cm) 0.3 Area: (cm) 0.393 Volume: (cm) 0.118 % Reduction in Area: 58.6% % Reduction in Volume: 37.9% Epithelialization: Small (1-33%) Tunneling: No Undermining: No Wound Description Classification: Full Thickness Without Exposed Support Structures Wound Margin: Distinct, outline attached Exudate Amount: Medium Exudate Type: Serosanguineous Exudate Color: red, brown Foul Odor After  Cleansing: No Slough/Fibrino Yes Wound Bed Granulation Amount: Small (1-33%) Exposed Structure Granulation Quality: Red Fascia Exposed: No Necrotic Amount: Large (67-100%) Fat Layer (Subcutaneous Tissue) Exposed: Yes Necrotic Quality: Adherent Slough Tendon Exposed: No Muscle Exposed: No Joint Exposed: No Bone Exposed: No Treatment Notes Wound #38 (Lower Leg) Wound Laterality: Right, Anterior Cleanser Normal Saline Discharge Instruction: Cleanse the wound with Normal Saline prior to applying Shawn clean dressing using gauze sponges, not tissue or cotton balls. Soap and Water Discharge Instruction: May shower and wash wound with dial antibacterial soap and water prior to dressing change. Peri-Wound Care Topical Primary Dressing KerraCel Ag Gelling Fiber Dressing, 4x5 in (silver alginate) Discharge Instruction: Apply silver alginate to wound bed as instructed Secondary Dressing ABD Pad, 8x10 Discharge Instruction: Apply over primary dressing as directed. Woven Gauze Sponge, Non-Sterile 4x4 in Discharge Instruction: Apply over primary dressing as directed. Secured With Paper Tape, 2x10 (in/yd) Discharge Instruction: Secure dressing with tape as directed. Compression Wrap FourPress (4 layer compression wrap) Discharge Instruction: Apply four layer compression as directed. May also use Miliken CoFlex 2 layer compression system as alternative. Compression Stockings Add-Ons Electronic Signature(s) Signed: 09/14/2021 5:16:37 PM By: Karie Schwalbe RN Entered By: Karie Schwalbe on 09/14/2021 13:53:46 -------------------------------------------------------------------------------- Wound Assessment Details Patient Name: Date of Service: [name] 09/14/2021 1:15 PM Medical Record Number: [id number] Patient Account Number: [id number] Date of Birth/Sex: Treating RN: [birth year] (41 y.o. Dianna Limbo Primary Care Derin Matthes: Georganna Skeans Other Clinician: Referring  Macaulay Reicher: Treating Sinda Leedom/Extender: Kathreen Cosier in Treatment: 1 Wound Status Wound Number: 39 Primary Venous Leg Ulcer Etiology: Wound Location: Right, Lateral Lower Leg Wound Open Wounding Event: Gradually Appeared Status: Date Acquired: 03/12/2021 Comorbid Lymphedema, Deep Vein Thrombosis, Hypertension, Peripheral Weeks Of Treatment: 1 History: Venous Disease, Type II Diabetes Clustered Wound: Yes Photos Wound Measurements Length: (cm) 1.1 Width: (cm) 0.8 Depth: (cm) 0.2 Area: (cm) 0.691 Volume: (cm) 0.138 % Reduction in Area: 89.5% % Reduction in Volume: 89.5% Epithelialization: Small (1-33%) Tunneling: No Undermining: No Wound Description Classification: Full Thickness Without Exposed Support Structures Wound Margin: Distinct, outline attached Exudate Amount: Medium Exudate Type: Serosanguineous Exudate Color: red, brown Foul Odor After Cleansing: No Slough/Fibrino Yes Wound Bed Granulation Amount: Medium (34-66%) Exposed Structure Granulation Quality: Red Fascia Exposed: No Necrotic Amount: Medium (34-66%) Fat Layer (Subcutaneous Tissue) Exposed: Yes Necrotic Quality: Adherent Slough Tendon Exposed: No Muscle Exposed: No Joint Exposed: No Bone Exposed: No Treatment Notes Wound #39 (Lower Leg) Wound Laterality: Right, Lateral Cleanser Normal Saline Discharge Instruction: Cleanse the wound with Normal Saline prior to applying Shawn clean dressing using gauze sponges, not tissue or cotton balls. Soap and Water Discharge Instruction: May shower and wash wound with dial antibacterial soap and water prior to dressing change. Peri-Wound Care Topical Primary Dressing KerraCel Ag Gelling Fiber Dressing, 4x5 in (silver alginate) Discharge Instruction: Apply silver alginate to wound bed as instructed Secondary Dressing ABD Pad, 8x10 Discharge Instruction:  Apply over primary dressing as directed. Woven Gauze Sponge, Non-Sterile 4x4  in Discharge Instruction: Apply over primary dressing as directed. Secured With Paper Tape, 2x10 (in/yd) Discharge Instruction: Secure dressing with tape as directed. Compression Wrap FourPress (4 layer compression wrap) Discharge Instruction: Apply four layer compression as directed. May also use Miliken CoFlex 2 layer compression system as alternative. Compression Stockings Add-Ons Electronic Signature(s) Signed: 09/14/2021 5:16:37 PM By: Karie Schwalbe RN Entered By: Karie Schwalbe on 09/14/2021 13:54:27 -------------------------------------------------------------------------------- Vitals Details Patient Name: Date of Service: [name], Shawn Oppenheim [name]. 09/14/2021 1:15 PM Medical Record Number: [id number] Patient Account Number: [id number] Date of Birth/Sex: Treating RN: 06-Apr-1980 (41 y.o. Dianna Limbo Primary Care Jerrol Helmers: Georganna Skeans Other Clinician: Referring Aashrith Eves: Treating Doyel Mulkern/Extender: Kathreen Cosier in Treatment: 1 Vital Signs Time Taken: 13:37 Temperature (F): 98.8 Height (in): 74 Pulse (bpm): 97 Weight (lbs): 450 Respiratory Rate (breaths/min): 20 Body Mass Index (BMI): 57.8 Blood Pressure (mmHg): 148/100 Reference Range: 80 - 120 mg / dl Electronic Signature(s) Signed: 09/14/2021 5:16:37 PM By: Karie Schwalbe RN Entered By: Karie Schwalbe on 09/14/2021 13:37:40

## 2021-09-14 NOTE — Progress Notes (Addendum)
[name], Shawn T ([id number]) . Visit Report for 09/14/2021 Chief Complaint Document Details Patient Name: Date of Service: [name] 09/14/2021 1:15 PM Medical Record Number: [id number] Patient Account Number: [id number] Date of Birth/Sex: Treating RN: 1980-04-17 (41 y.o. 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 09/14/2021 1:15 PM Medical Record Number: [id number] Patient Account Number: [id number] Date of Birth/Sex: Treating RN: 1981-01-13 (41 y.o. Dianna Limbo Primary Care Provider: Georganna Skeans Other Clinician: Referring Provider: Treating Provider/Extender: Kathreen Cosier in Treatment: 1 Debridement Performed for Assessment: Wound #38 Right,Anterior Lower Leg Performed By: Physician Duanne Guess, MD Debridement Type: Debridement Severity of Tissue Pre Debridement: Fat layer exposed Level of Consciousness (Pre-procedure): Awake and Alert Pre-procedure Verification/Time Out Yes - 14:00 Taken: Start Time: 14:00 Pain Control: Lidocaine 5% topical ointment T Area Debrided (L x W): otal 1 (cm) x 0.5 (cm) = 0.5 (cm) Tissue and other material debrided: Non-Viable, Eschar, Slough, Subcutaneous, Slough Level: Skin/Subcutaneous  Tissue Debridement Description: Excisional Instrument: Curette Bleeding: Minimum Hemostasis Achieved: Pressure End Time: 14:03 Procedural Pain: 0 Post Procedural Pain: 0 Response to Treatment: Procedure was tolerated well Level of Consciousness (Post- Awake and Alert procedure): Post Debridement Measurements of Total Wound Length: (cm) 1 Width: (cm) 0.5 Depth: (cm) 0.3 Volume: (cm) 0.118 Character of Wound/Ulcer Post Debridement: Improved Severity of Tissue Post Debridement: Fat layer exposed Post Procedure Diagnosis Same as Pre-procedure Electronic Signature(s) Signed: 09/14/2021 2:46:22 PM By: Duanne Guess MD FACS Signed: 09/14/2021 5:16:37 PM By: Karie Schwalbe RN Entered By: Karie Schwalbe on 09/14/2021 14:04:05 -------------------------------------------------------------------------------- Debridement Details Patient Name: Date of Service: Shawn Bering T. 09/14/2021 1:15 PM Medical Record Number: [id number] Patient Account Number: [id number] Date of Birth/Sex: Treating RN: 1980-10-06 (41 y.o. Dianna Limbo Primary Care Provider: Georganna Skeans Other Clinician: Referring Provider: Treating Provider/Extender: Kathreen Cosier in Treatment: 1 Debridement Performed for Assessment: Wound #37 Right,Medial Lower Leg Performed By: Physician Duanne Guess, MD Debridement Type: Debridement Severity of Tissue Pre Debridement: Fat layer exposed Level of Consciousness (Pre-procedure): Awake and Alert Pre-procedure Verification/Time Out Yes - 14:00 Taken: Start Time: 14:00 Pain Control: Lidocaine 5% topical ointment T Area Debrided (L x W): otal 3.7 (cm) x 1.9 (cm) = 7.03 (cm) Tissue and other material debrided: Non-Viable, Eschar, Slough, Subcutaneous, Slough Level: Skin/Subcutaneous Tissue Debridement Description: Excisional Instrument: Curette Bleeding: Minimum Hemostasis Achieved: Pressure End Time: 14:03 Procedural Pain: 0 Post  Procedural Pain: 0 Response to Treatment: Procedure was tolerated well Level of Consciousness (Post- Awake and Alert procedure): Post Debridement Measurements of Total Wound Length: (cm) 3.7 Width: (cm) 1.9 Depth: (cm) 0.3 Volume: (cm) 1.656 Character of Wound/Ulcer Post Debridement: Improved Severity of Tissue Post Debridement: Fat layer exposed Post Procedure Diagnosis Same as Pre-procedure Electronic Signature(s) Signed: 09/14/2021 2:46:22 PM By: Duanne Guess MD FACS Signed: 09/14/2021 5:16:37 PM By: Karie Schwalbe RN Entered By: Karie Schwalbe on 09/14/2021 14:04:46 --------------------------------------------------------------------------------  Debridement Details Patient Name: Date of Service: [name] 09/14/2021 1:15 PM Medical Record Number: [id number] Patient Account Number: [id number] Date of Birth/Sex: Treating RN: 03-29-1980 (41 y.o. Dianna Limbo Primary Care Provider: Georganna Skeans Other Clinician: Referring Provider: Treating Provider/Extender: Kathreen Cosier in Treatment: 1 Debridement Performed for Assessment: Wound #36 Right,Posterior Lower Leg Performed By: Physician Duanne Guess, MD Debridement Type: Debridement Severity of Tissue Pre Debridement: Fat layer exposed Level of Consciousness (Pre-procedure): Awake and Alert Pre-procedure Verification/Time Out Yes - 14:00 Taken: Start Time: 14:00 Pain Control: Lidocaine 5% topical ointment T Area Debrided (L x W): otal 5.2 (cm) x 3.6 (cm) = 18.72 (cm) Tissue and other material debrided: Non-Viable, Eschar, Slough, Subcutaneous, Slough Level: Skin/Subcutaneous Tissue Debridement Description: Excisional Instrument: Curette Bleeding: Minimum Hemostasis Achieved: Pressure End Time: 14:03 Procedural Pain: 0 Post Procedural Pain: 0 Response to Treatment: Procedure was tolerated well Level of Consciousness (Post- Awake and Alert procedure): Post Debridement  Measurements of Total Wound Length: (cm) 5.2 Width: (cm) 3.6 Depth: (cm) 0.4 Volume: (cm) 5.881 Character of Wound/Ulcer Post Debridement: Improved Severity of Tissue Post Debridement: Fat layer exposed Post Procedure Diagnosis Same as Pre-procedure Electronic Signature(s) Signed: 09/14/2021 2:46:22 PM By: Duanne Guess MD FACS Signed: 09/14/2021 5:16:37 PM By: Karie Schwalbe RN Entered By: Karie Schwalbe on 09/14/2021 14:05:43 -------------------------------------------------------------------------------- Debridement Details Patient Name: Date of Service: Shawn Bering T. 09/14/2021 1:15 PM Medical Record Number: [id number] Patient Account Number: [id number] Date of Birth/Sex: Treating RN: February 04, 1981 (41 y.o. Dianna Limbo Primary Care Provider: Georganna Skeans Other Clinician: Referring Provider: Treating Provider/Extender: Kathreen Cosier in Treatment: 1 Debridement Performed for Assessment: Wound #39 Right,Lateral Lower Leg Performed By: Physician Duanne Guess, MD Debridement Type: Debridement Severity of Tissue Pre Debridement: Fat layer exposed Level of Consciousness (Pre-procedure): Awake and Alert Pre-procedure Verification/Time Out Yes - 14:00 Yes - 14:00 Taken: Start Time: 14:00 Pain Control: Lidocaine 5% topical ointment T Area Debrided (L x W): otal 1.1 (cm) x 0.8 (cm) = 0.88 (cm) Tissue and other material debrided: Non-Viable, Eschar, Slough, Subcutaneous, Slough Level: Skin/Subcutaneous Tissue Debridement Description: Excisional Instrument: Curette Bleeding: Minimum Hemostasis Achieved: Pressure End Time: 14:03 Procedural Pain: 0 Post Procedural Pain: 0 Response to Treatment: Procedure was tolerated well Level of Consciousness (Post- Awake and Alert procedure): Post Debridement Measurements of Total Wound Length: (cm) 1.1 Width: (cm) 0.8 Depth: (cm) 0.2 Volume: (cm) 0.138 Character of Wound/Ulcer Post Debridement:  Improved Severity of Tissue Post Debridement: Fat layer exposed Post Procedure Diagnosis Same as Pre-procedure Electronic Signature(s) Signed: 09/14/2021 2:46:22 PM By: Duanne Guess MD FACS Signed: 09/14/2021 5:16:37 PM By: Karie Schwalbe RN Entered By: Karie Schwalbe on 09/14/2021 14:06:28 -------------------------------------------------------------------------------- HPI Details Patient Name: Date of Service: [name], Shawn Oppenheim T. 09/14/2021 1:15 PM Medical Record Number: [id number] Patient Account Number: [id number] Date of Birth/Sex: Treating RN: [birth year] (41 y.o. Dianna Limbo Primary Care Provider: Georganna Skeans Other Clinician: Referring Provider: Treating Provider/Extender: Kathreen Cosier in Treatment: 1 History of Present Illness HPI Description: The patient is Shawn 41 yrs old bm here for evaluation of his right leg ulcer. He has an extensive history of lymphedema and ulcers. He is being treated at the [facility] by Dr. [name] with [name] boots and the [facility]. He has pumps at home but he is only using them once Shawn day. 08/30/14 selective debridement done of surface eschar. The wound cleans up quite nicely in the bed of this  looks healthy. He is using Shawn wound VAC under an Unna wrap. 11/17/14; selective debridement done of surface eschar. Once again the wound beds at clean up quite nicely. He is no longer using Shawn wound VAC. Recent dressing changes include Hydrofera Blue. Apparently he has done Shawn wide variety of different topical dressings including advanced treatment options like Apligraf's without success. 03/21/15; surgical debridement done of surface eschar and nonviable subcutaneous tissue. The area on the medial aspect of the right leg has closed and the wound on the lateral right leg looks improved has been using Silver Collagen 04/11/15; I think attendance here is sporadic. The area on the right medial leg remains healed. He has Shawn new tiny area on the back of the  right leg. Again Shawn surgical debridement of the surface eschar and nonviable subcutaneous tissue of the major wound on the right lateral leg. He has been using Silver Collagen and at home, he does his own Unna wraps at home edema control seems reasonable. 04/20/15; the area on the right medial leg has Shawn very superficial area which may not even be open nevertheless I felt needed to be dressed today [this is his original chronic wound] the new wound is on the right anterior lateral leg is underwent Shawn surgical debridement. He has Shawn small wound on the right posterior leg. He puts on his own Unna boots, according to our nurses he does this fairly well. We have been using Silver collagen. 05/02/2015 -- I understand in the past his venous studies have been done at [facility] and he was advised weight loss before they would attempt to tackle his iliac vein blockage. He has not gone back for review. 06/27/2015 -- we have applied for Apligraf and are awaiting his insurance clearance. 07/25/2015 -- he still has to use her back from his insurance agent regarding his copayment for his Apligraf. 07/31/2015 -- the patient got Shawn reply from the insurance agent regarding the dollar payment for his application but is not sure whether it is for 5 or for one. 11/28/2015 -- has been hurting Shawn little bit more and he has had change in color of his wound especially on the medial part. 12/05/2015 -- his culture was positive for Proteus mirabilis and K Oxytoca which are sensitive to ciprofloxacin which he is already on. 10/3/17still on ciprofloxacin. His mother reports of dressing had to be changed last Friday due to odor. He has not been systemically unwell 12/19/15; patient came in today complaining of increasing pain and tightness in his upper right thigh. He completed the ciprofloxacin 2 or 3 days ago. He is not running Shawn fever today. 12/26/2015 -- last week he had been seen by Dr. [name] who got Shawn lower extremity venous  duplex evaluation done which did not show DVT or SVT in the right lower extremity. he had also put him on Augmentin in addition to the previous ciprofloxacin and he had received for 2 weeks 01/02/2016 -- -- was admitted to the hospital on 12/26/2015 and discharged on 12/29/2015 and was treated for cellulitis. He was also newly diagnosed with type 2 diabetes mellitus and outpatient monitoring and initiation of treatment was recommended. Patients hemoglobin A1c was 6.6 No osteomyelitis detected on x-rays and recent Doppler study was negative for DVT Oral doxycycline and Levaquin were recommended for the patient as an . outpatient for Shawn 14 day treatment. IV Zosyn and vancomycin was given due to concerns of Pseudomonas infection while he was in hospital. 02/13/2016 --  he has not gone back to [facility] where he had his vascular opinion earlier and I have urged him to regroup with them to see if there is any surgical options available. 02/27/2016 -- has an appointment to see [facility] vascular surgeons on January 3 and may be seeing Dr. Verdie Drown 03/19/2016 -- I was not able to find any notes on Epic but the patient did go to the vascular clinic at [facility] and saw the PA to Dr. Verdie Drown. I understand she has recommended Shawn CT scan and MRI to be done on February 1 and they would review this with him on the same day. 04/09/2016 -- was admitted to the hospital on 03/20/2016 acute respiratory failure with hypoxia, abdominal discomfort with erythema, hypertensive urgency and chronic wound of the right leg with morbid obesity. he was discharged home on 04/05/2016 and was to continue on IV antibiotics for 18 more days follow-up with the wound center and continue with his cardiologist. he has lost approximately 130 pounds and diuresed over 48 L. He was treated with IV Zosyn and ID recommended he continue this for 3 weeks more. The notes from [facility] wound clinic were noted where he was seen  by the PA and she had taken cultures which grew MSSA and Pseudomonas. She had recommended edema control with Shawn 4-layer compression and also referred him to the lymphedema clinic. Shawn CT venogram was also planned for the future. 04/16/2016 -- at [facility] Shawn CT pelvic venogram runoff was done on 04/11/2016 -- IMPRESSION:-Limited study secondary to poor opacification of venous structures. No definite evidence of acute deep vein thrombosis. 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 -Extensive right iliac chain and right inguinal adenopathy, favored to be reactive/secondary to congestion. The patient continues on his IV antibiotics through his PICC line and has his cardiology opinion and also Shawn bariatric surgery opinion coming up. 04/30/2016 -- He has completed his IV antibiotics through his PICC line. 05/14/2016 --he was seen by the PA, Ms Sluss, recommended alternate day dressing with compression and use Hibiclens and Dakin's solution with acetic acid on his wounds. 07/30/2016 -- he has still not got his custom-made compression stockings and I have urged him to go and get him self measured for these. 08/06/2016 -- he has been measured for his custom stocking and will get in 2 weeks. He has lost 20 pounds since March 08/27/2016 -- he has not got his custom stockings yet and he tried another pair which has not fit well and he has had Shawn lot of maceration increase the size of his wound. 09/03/2016 -- the patient says that he has not been very compliant with his dressing changes and his elevation and exercise and this week he is notices wound get rather large with maceration. 09/18/16; according to our intake nurse the measurements on this patient's wound are slightly larger. I note previous compliance concerns. I note that his wounds measured larger last week which was noted by Dr. Meyer Russel. Culture done last week was  negative. 10/01/2016 -- the patient has received some lymphedema pumps which are new and he says he is being compliant with this. He is going to be away for 2 weeks on vacation to [facility] 10/15/2016 -- he returns after 2 weeks and tells me he has been diligent with his dressing and has lost 25 pounds over the last 3 months. 10/22/2016 --  his last hemoglobin A1c was 6.2 and he is now diet controlled. 11/19/2016 -- he has been having problems with his compression stockings which are custom made at [facility] medical. He has not yet been able to get them. He had taken out his compression because he had gone there and had no dressing on when he came here today READMISSION 05/22/2018 [name] is Shawn now 41 year old man with Shawn Helbling history of wounds in his lower extremities secondary to chronic venous insufficiency with secondary lymphedema. He has had previous vein ligations on the right although I do not have this information in front of me. As I understand things he also has central venous obstruction with Shawn vein bypass in 2005 I believe this was done at [facility]. He tells me over the last 2 months he has noted mid reopening in the right mid anterior lower extremity. This is Shawn rectangular shaped wound which is kind of odd in terms of how that formed. He has been using silver alginate and Unna boots that we used on him when he was here in 2018. He buys his product supply online thinks this is cheaper than ordering through intermediary's Past medical history; CHF, morbid obesity, hypertension, hyperlipidemia, chronic venous insufficiency, vein bypass in 2005 previous vein ablations or ligations. He has Shawn stent in the right lower extremity. ABI in this clinic was 1.04. He is using his compression pumps at home 3/27; 2-week follow-up. Right lower extremity wounds related to chronic venous insufficiency and lymphedema. He has been using silver alginate under an Radio broadcast assistant. He change the [employer] himself at home last  week. He is making nice progress 4/10; 2-week follow-up. Right lower extremity wound is just about closed Shawn small open area in the middle of the and large rectangular wound he had at the beginning. His edema control is excellent. He says he is using his compression pumps religiously 4/17; 2-week follow-up. Right lower extremity wound is totally closed. He had Shawn large rectangular wound which is progressively closed. His edema control is very good. He is using his compression pumps once to twice Shawn day READMISSION 09/19/2020 [name] is now Shawn 41 year old man we have had for several stays in this clinic including 2017, 2018 and most recently in 2020 with Shawn wound on his right lower leg. He has compression pumps which he claims to be using twice Shawn day. He also has stockings however I am not sure how much he uses them. Things have broken down over the last month or 2 he has an extensive wound area on the right mid tibial area I think this is similar to what he has had in the past. He has been using Xeroform and an Ace wrap that his girlfriend is applying. Past medical history; the patient has known chronic venous insufficiency with secondary lymphedema. He had an iliac vein blockage and he had Shawn bypass at [facility] although the patient is not followed up with them. The surgeon may have been Dr. Jacolyn Reedy. As noted he has lymphedema pumps. Type 2 diabetes congestive heart failure obstructive sleep apnea. He has Shawn history of Shawn right leg DVT although this may have been the iliac vein he is not currently on anticoagulation ABI in our clinic is 1.37 on the right Imaging Results - in this encounter CT Pelvic Venogram Run Off Imaging Results - CT Pelvic Venogram Run Off Impressions Performed At -Limited study secondary to poor opacification of venous structures. Nodefinite evidence of acute deep vein  thrombosis. 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 -Extensive right iliac chain and right inguinal adenopathy, favored to bereactive/secondary to congestion. [facility] RAD Imaging Results - CT Pelvic Venogram Run Off Narrative Performed At EXAM: CT PELVIC VENOGRAM RUN OFF DATE: 04/11/2016 1:28 PM ACCESSION: [id number] UN DICTATED: 04/11/2016 2:18 PM INTERPRETATION LOCATION: [facility] CLINICAL INDICATION: 41 years old Male with h/o RLE DVT and massive lymphedema of both LE's. Eval for venous outflow obstruction vs extrinsic mass- L97.203-Skin ulcer of calf with necrosis of muscle, unspecified laterality(RAF-HCC) COMPARISON: CT abdomen pelvis dated 02/11/2007 TECHNIQUE: Shawn spiral CT scan was obtained approximately 3 minutes after administration of IV contrast from the kidneys to the knees.Images were reconstructed in the axial plane. Multiplanar reformatted and MIP images were provided for further evaluation of the vessels. For selected cases, 3D volumerendered images are also provided. VASCULAR FINDINGS: There is overall poor contrast opacification of the venous structures, limitingevaluation. IVC: No evidence of thrombus. Iliac veins: The right iliac veins are chronically occluded/diminutive in size. There are extensive venous collaterals noted throughout the right pelvis extending into the right lower extremity. The number and caliber of the venous collaterals have increased when compared to 02/11/2007. The left iliac veins appear patent. There are also small caliber venous collaterals within the left pelvis extending into the left lower extremity. Anterior abdominal wall venouscollaterals are also noted but fully imaged secondary to patient diameter. The bilateral femoral and popliteal veins appear patent. There is no evidence of acute thrombus. The arterial vasculature is unremarkable on this venous phase time study. NONVASCULAR FINDINGS: LOWER CHEST Unremarkable. : ABDOMEN: HEPATOBILIARY: Unremarkable  liver. No biliary ductal dilatation. Gallbladder isremarkable. PANCREAS: Unremarkable. SPLEEN: Unremarkable. ADRENAL GLANDS: Unremarkable. KIDNEYS/URETERS: Unremarkable. BLADDER: Unremarkable. BOWEL/PERITONEUM/RETROPERITONEUM: No bowel obstruction. No acute inflammatoryprocess. No ascites. LYMPH NODES: Extensive right iliac chain and right inguinal adenopathy. Shawn nodal conglomerate in the right inguinal region measures 6.8 x 3.4 cm (2:124). This is only slightly larger when compared to 02/11/2007. Given interval stabilityover 10 years, these are favored to be reactive/secondary to congestion. REPRODUCTIVE: Unremarkable. BONES/SOFT TISSUES: No acute osseous abnormalities. Postsurgical changes in the right inguinal and left medial thigh region. Right greater than left softtissue edema throughout the lower extremities. [facility] RAD Imaging Results - CT Pelvic Venogram Run Off Procedure Note Interface, Rad Results In - 04/11/2016 3:28 PM EST EXAM: CT PELVIC VENOGRAM RUN OFF DATE: 04/11/2016 1:28 PM ACCESSION: [id number] UN DICTATED: 04/11/2016 2:18 PM INTERPRETATION LOCATION: [facility] CLINICAL INDICATION: 41 years old Male with h/o RLE DVT and massive lymphedema of both LE's. Eval for venous outflow obstruction vs extrinsic mass- L97.203-Skin ulcer of calf with necrosis of muscle, unspecified laterality (RAF-HCC) COMPARISON: CT abdomen pelvis dated 02/11/2007 TECHNIQUE: Shawn spiral CT scan was obtained approximately 3 minutes after administration of IV contrast from the kidneys to the knees. Images were reconstructed in the axial plane. Multiplanar reformatted and MIP images were provided for further evaluation of the vessels. For selected cases, 3D volume rendered images are also provided. VASCULAR FINDINGS: There is overall poor contrast opacification of the venous structures, limiting evaluation. IVC: No evidence of thrombus. Iliac veins: The right iliac veins are chronically occluded/diminutive in  size. There are extensive venous collaterals noted throughout the right pelvis extending into the right lower extremity. The number and caliber of the venous collaterals have increased when compared to 02/11/2007. The left iliac veins appear patent. There are also small caliber venous collaterals within the left  pelvis extending into the left lower extremity. Anterior abdominal wall venous collaterals are also noted but fully imaged secondary to patient diameter. The bilateral femoral and popliteal veins appear patent. There is no evidence of acute thrombus. The arterial vasculature is unremarkable on this venous phase time study. NONVASCULAR FINDINGS: LOWER CHEST Unremarkable. : ABDOMEN: HEPATOBILIARY: Unremarkable liver. No biliary ductal dilatation. Gallbladder is remarkable. PANCREAS: Unremarkable. SPLEEN: Unremarkable. ADRENAL GLANDS: Unremarkable. KIDNEYS/URETERS: Unremarkable. BLADDER: Unremarkable. BOWEL/PERITONEUM/RETROPERITONEUM: No bowel obstruction. No acute inflammatory process. No ascites. LYMPH NODES: Extensive right iliac chain and right inguinal adenopathy. Shawn nodal conglomerate in the right inguinal region measures 6.8 x 3.4 cm (2:124). This is only slightly larger when compared to 02/11/2007. Given interval stability over 10 years, these are favored to be reactive/secondary to congestion. REPRODUCTIVE: Unremarkable. BONES/SOFT TISSUES: No acute osseous abnormalities. Postsurgical changes in the right inguinal and left medial thigh region. Right greater than left soft tissue edema throughout the lower extremities. IMPRESSION: -Limited study secondary to poor opacification of venous structures. No definite evidence of acute deep vein thrombosis. 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 -Extensive right iliac chain and right inguinal adenopathy, favored to be reactive/secondary  to congestion. Imaging Results - CT Pelvic Venogram Run Off Performing Organization Address City/State/Zipcode Phone Number [facility] [address] [place], [place] [address] Surgical summary as listed below; Assessment: [name] is Shawn 41 y.o. male with [place] history of right lower extremity chronic obstructive deep venous disease with recurrent ulceration, absence of right iliac vein, prior left to right femoral vein bypass. Patient continues with recurrent ulceration, his weight precludes him from further vascular studies at this time. If he is able to lose 50-100 pounds we would be able to do left lower extremity venogram possible intervention with 50% chance of re- opening occlusion per Dr. Jacolyn Reedy. For global health, weight loss, potential improvement in his chronic venous insufficiency would appreciateinput from bariatric surgery group for recommendations. 09/26/20; patient comes in today with not much improvement. He has also some odor. 3 open areas and this linear area in his mid calf require debridement. We have been using silver alginate under compression. I gave him Keflex last week for what I thought was cellulitis in the right medial thigh he is completing this and I think this is better. 7/26; patient comes in today with again necrotic debris over these wounds. According to our intake nurse extreme malodor and drainage. We have been using silver alginate under compression. He is complaining about pain in the wound area. 7/29; Patient presents for follow-up. He reports improvement to the wound. He has finished taking Keflex. He has no issues or complaints today. 10/17/2020 upon evaluation today patient appears to be doing poorly in regard to his leg ulcer. He has been tolerating the dressing changes without complication. Fortunately there does not appear to be any signs of active infection at this time. No fevers, chills, nausea, vomiting, or diarrhea. 8/23; the patient has 2 areas anteriorly  and Shawn larger area medially on the right lower leg. He comes in today with his compression wrap slipping down. We do not have good edema control. We have been using silver alginate. I believe he completed Shawn course of antibioticso Levaquin given to him 2 weeks ago at his last visit. He states his girlfriend who is Shawn nurse changes his dressings. We have not seen this in 2 weeks. He just received Shawn compounded topical  antibiotic based on I believe Shawn deep tissue culture that was done. I do not have Shawn result of the deep tissue culture today and he forgot the compounded antibiotic. I will try to clarify this next time I tried to send him back to vascular at [facility] for review of his central venous obstruction although they would not even see him I think because of noncompliance, or at least perceived noncompliance with regards to the recommendations for weight loss, consideration of bariatric assessment for surgery etc. 8/30; although the patient's wounds anteriorly on the right lower leg extending medially were debrided today. Better looking wound surface he has better edema control still under 4-layer compression. He tells me he is using his compression pump twice Shawn day however they are greater than 7 years old and he might benefit from Shawn new set. He is using his Jodie Echevaria compounded antibiotic Since the last time he was here he saw Dr. Durwin Nora at vein and vascular at our request. He was felt to have adequate arterial flow for healing although there was still the issue about whether he might need an angiogram if things still. 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 He did not talk about the s previous central venous procedures that have been done at [facility]. That information is available in care everywhere however based on the duplex exam he was not felt to be Shawn candidate for any venous procedures on the right. He has no significant venous reflux on the right and has had previously had Shawn right  greater saphenous vein ablated 9/6; the patient has Shawn cluster of small areas over the right anterior mid tibia as well as an area medially. That the air 1 medially is more confluent all of them look like they have Shawn better surface. We have been putting him in 4-layer compression and using his compression pumps twice Shawn day 9/13; both of the patient's wound areas appear somewhat better especially the large area medially. 9/27; 2-week follow-up. His wife who is Shawn Designer, jewellery has been changing his 4-layer compression with Hydrofera Blue and [place] I think the measurements of the 2 remaining areas 1 on the right anterior tibia and 1 on the right lateral are better. In another setting I might consider advanced treatment product here. He is using his compression pumps 10/11; patient presents for follow-up. He has no issues or complaints today. His girlfriend continues to change his compression wrap with Hydrofera Blue and [place]. He denies signs of infection. 10/25; patient presents for follow-up. He reports Shawn new wound to the right lateral aspect. He states the area was itching and he scratched developing Shawn wound. He denies signs of infection. 11/11; patient presents for follow-up. He no longer has Shawn wound to the right lateral aspect. He had to use an Radio broadcast assistant for the past week due to short supply. He has no issues or complaints today. He denies signs of infection. READMISSION 09/07/2021: The patient is back with new wounds on his right lower extremity. He says that they first emerged around the first of the year. He was seen in the emergency department on June 22 for angioedema either secondary to his ARB or Shawn new lotion that he tried. He was given prednisone. At the same time, the provider in the ER ordered doxycycline to cover his leg ulcer and applied Shawn Unna boot. He did not have an Radio broadcast assistant on in clinic today but his significant other and  he have been applying Neosporin with Kerlix and  Coban wrapping. He denies any fevers or chills. No pain. No significant odor. All of the wounds have the fat layer exposed and there is slough buildup on all open surfaces. 09/14/2021: All of the wounds are little bit smaller today. They still have slough accumulation on the surface. We have been using silver alginate with 4-layer compression. Shawn culture taken last week demonstrated Shawn polymicrobial population, chiefly Escherichia coli. He had already been prescribed doxycycline in the ER and this was considered to be second line treatment per the PCR culture pharmacist. I did not prescribe any new antibiotics but we did place an order for [facility] topical compounded antibiotic therapy. Electronic Signature(s) Signed: 09/14/2021 2:08:21 PM By: Duanne Guess MD FACS Previous Signature: 09/14/2021 2:04:32 PM Version By: Duanne Guess MD FACS Entered By: Duanne Guess on 09/14/2021 14:08:20 -------------------------------------------------------------------------------- Physical Exam Details Patient Name: Date of Service: [name], [name] 09/14/2021 1:15 PM Medical Record Number: [id number] Patient Account Number: [id number] Date of Birth/Sex: Treating RN: 27-Sep-1980 (41 y.o. Dianna Limbo Primary Care Provider: Georganna Skeans Other Clinician: Referring Provider: Treating Provider/Extender: Kathreen Cosier in Treatment: 1 Constitutional Hypertensive, asymptomatic. . . . No acute distress.[name] Respiratory Normal work of breathing on room air.. Notes 09/14/2021: All of the wounds are little bit smaller today. They still have slough accumulation on the surface. Electronic Signature(s) Signed: 09/14/2021 2:46:22 PM By: Duanne Guess MD FACS Entered By: Duanne Guess on 09/14/2021 14:21:26 -------------------------------------------------------------------------------- Physician Orders Details Patient Name: Date of Service: [name], [name] 09/14/2021 1:15  PM Medical Record Number: [id number] Patient Account Number: [id number] Date of Birth/Sex: Treating RN: 1980-05-24 (42 y.o. Dianna Limbo Primary Care Provider: Georganna Skeans Other Clinician: Referring Provider: Treating Provider/Extender: Kathreen Cosier in Treatment: 1 Verbal / Phone Orders: No Diagnosis Coding ICD-10 Coding Code Description [phone] Non-pressure chronic ulcer of other part of right lower leg with fat layer exposed I50.9 Heart failure, unspecified I10 Essential (primary) hypertension E11.622 Type 2 diabetes mellitus with other skin ulcer E66.01 Morbid (severe) obesity due to excess calories I89.0 Lymphedema, not elsewhere classified Follow-up Appointments Return appointment in 3 weeks. - Dr. Lady Gary Room 2 Friday 7/14 at 8:30am Bathing/ Shower/ Hygiene May shower with protection but do not get wound dressing(s) wet. - May use Shawn cast wrap to put over wraps to shower; you can buy these at [facility] or [facility] Edema Control - Lymphedema / SCD / Other Lymphedema Pumps. Use Lymphedema pumps on leg(s) 2-3 times Shawn day for 45-60 minutes. If wearing any wraps or hose, do not remove them. Continue exercising as instructed. Elevate legs to the level of the heart or above for 30 minutes daily and/or when sitting, Shawn frequency of: - throughout the day Avoid standing for Scala periods of time. Patient to wear own compression stockings every day. - on Left Leg Wound Treatment Wound #36 - Lower Leg Wound Laterality: Right, Posterior Cleanser: Normal Saline 1 x Per Week/30 Days Discharge Instructions: Cleanse the wound with Normal Saline prior to applying Shawn clean dressing using gauze sponges, not tissue or cotton balls. Cleanser: Soap and Water 1 x Per Week/30 Days Discharge Instructions: May shower and wash wound with dial antibacterial soap and water prior to dressing change. Prim Dressing: KerraCel Ag Gelling Fiber Dressing, 4x5 in (silver alginate) 1 x Per  Week/30 Days ary Discharge Instructions: Apply silver alginate to wound bed as instructed Secondary Dressing: ABD  Pad, 8x10 1 x Per Week/30 Days Discharge Instructions: Apply over primary dressing as directed. Secondary Dressing: Woven Gauze Sponge, Non-Sterile 4x4 in 1 x Per Week/30 Days Discharge Instructions: Apply over primary dressing as directed. Secured With: Paper Tape, 2x10 (in/yd) 1 x Per Week/30 Days Discharge Instructions: Secure dressing with tape as directed. Compression Wrap: FourPress (4 layer compression wrap) 1 x Per Week/30 Days Discharge Instructions: Apply four layer compression as directed. May also use Miliken CoFlex 2 layer compression system as alternative. Wound #37 - Lower Leg Wound Laterality: Right, Medial Cleanser: Normal Saline 1 x Per Week/30 Days Discharge Instructions: Cleanse the wound with Normal Saline prior to applying Shawn clean dressing using gauze sponges, not tissue or cotton balls. Cleanser: Soap and Water 1 x Per Week/30 Days Discharge Instructions: May shower and wash wound with dial antibacterial soap and water prior to dressing change. Prim Dressing: KerraCel Ag Gelling Fiber Dressing, 4x5 in (silver alginate) 1 x Per Week/30 Days ary Discharge Instructions: Apply silver alginate to wound bed as instructed Secondary Dressing: ABD Pad, 8x10 1 x Per Week/30 Days Discharge Instructions: Apply over primary dressing as directed. Secondary Dressing: Woven Gauze Sponge, Non-Sterile 4x4 in 1 x Per Week/30 Days Discharge Instructions: Apply over primary dressing as directed. Secured With: Paper Tape, 2x10 (in/yd) 1 x Per Week/30 Days Discharge Instructions: Secure dressing with tape as directed. Compression Wrap: FourPress (4 layer compression wrap) 1 x Per Week/30 Days Discharge Instructions: Apply four layer compression as directed. May also use Miliken CoFlex 2 layer compression system as alternative. Wound #38 - Lower Leg Wound Laterality: Right,  Anterior Cleanser: Normal Saline 1 x Per Week/30 Days Discharge Instructions: Cleanse the wound with Normal Saline prior to applying Shawn clean dressing using gauze sponges, not tissue or cotton balls. Cleanser: Soap and Water 1 x Per Week/30 Days Discharge Instructions: May shower and wash wound with dial antibacterial soap and water prior to dressing change. Prim Dressing: KerraCel Ag Gelling Fiber Dressing, 4x5 in (silver alginate) 1 x Per Week/30 Days ary Discharge Instructions: Apply silver alginate to wound bed as instructed Secondary Dressing: ABD Pad, 8x10 1 x Per Week/30 Days Discharge Instructions: Apply over primary dressing as directed. Secondary Dressing: Woven Gauze Sponge, Non-Sterile 4x4 in 1 x Per Week/30 Days Discharge Instructions: Apply over primary dressing as directed. Secured With: Paper Tape, 2x10 (in/yd) 1 x Per Week/30 Days Discharge Instructions: Secure dressing with tape as directed. Compression Wrap: FourPress (4 layer compression wrap) 1 x Per Week/30 Days Discharge Instructions: Apply four layer compression as directed. May also use Miliken CoFlex 2 layer compression system as alternative. Wound #39 - Lower Leg Wound Laterality: Right, Lateral Cleanser: Normal Saline 1 x Per Week/30 Days Discharge Instructions: Cleanse the wound with Normal Saline prior to applying Shawn clean dressing using gauze sponges, not tissue or cotton balls. Cleanser: Soap and Water 1 x Per Week/30 Days Discharge Instructions: May shower and wash wound with dial antibacterial soap and water prior to dressing change. Prim Dressing: KerraCel Ag Gelling Fiber Dressing, 4x5 in (silver alginate) 1 x Per Week/30 Days ary Discharge Instructions: Apply silver alginate to wound bed as instructed Secondary Dressing: ABD Pad, 8x10 1 x Per Week/30 Days Discharge Instructions: Apply over primary dressing as directed. Secondary Dressing: Woven Gauze Sponge, Non-Sterile 4x4 in 1 x Per Week/30  Days Discharge Instructions: Apply over primary dressing as directed. Secured With: Paper Tape, 2x10 (in/yd) 1 x Per Week/30 Days Discharge Instructions: Secure dressing with tape as directed.  Compression Wrap: FourPress (4 layer compression wrap) 1 x Per Week/30 Days Discharge Instructions: Apply four layer compression as directed. May also use Miliken CoFlex 2 layer compression system as alternative. Electronic Signature(s) Signed: 09/14/2021 2:46:22 PM By: Duanne Guess MD FACS Entered By: Duanne Guess on 09/14/2021 14:25:33 -------------------------------------------------------------------------------- Problem List Details Patient Name: Date of Service: [name], [name] 09/14/2021 1:15 PM Medical Record Number: [id number] Patient Account Number: [id number] Date of Birth/Sex: Treating RN: 1980/12/11 (41 y.o. Dianna Limbo Primary Care Provider: Georganna Skeans Other Clinician: Referring Provider: Treating Provider/Extender: Kathreen Cosier in Treatment: 1 Active Problems ICD-10 Encounter Code Description Active Date MDM Diagnosis [phone] Non-pressure chronic ulcer of other part of right lower leg with fat layer 09/07/2021 No Yes exposed I50.9 Heart failure, unspecified 09/07/2021 No Yes I10 Essential (primary) hypertension 09/07/2021 No Yes E11.622 Type 2 diabetes mellitus with other skin ulcer 09/07/2021 No Yes E66.01 Morbid (severe) obesity due to excess calories 09/07/2021 No Yes I89.0 Lymphedema, not elsewhere classified 09/07/2021 No Yes Inactive Problems Resolved Problems Electronic Signature(s) Signed: 09/14/2021 2:03:34 PM By: Duanne Guess MD FACS Entered By: Duanne Guess on 09/14/2021 14:03:34 -------------------------------------------------------------------------------- Progress Note Details Patient Name: Date of Service: [name], Shawn Oppenheim T. 09/14/2021 1:15 PM Medical Record Number: [id number] Patient Account Number:  [id number] Date of Birth/Sex: Treating RN: [birth year] (41 y.o. Dianna Limbo Primary Care Provider: Georganna Skeans Other Clinician: Referring Provider: Treating Provider/Extender: Kathreen Cosier in Treatment: 1 Subjective Chief Complaint Information obtained from Patient pt has blockage or absence of r iliac vein 05/22/2018; patient is here for review of wound on his right lower extremity 09/19/2020; patient is again here for wound review of wounds on his right lower leg 09/07/2021: he returns with open wounds on the RLE History of Present Illness (HPI) The patient is Shawn 41 yrs old bm here for evaluation of his right leg ulcer. He has an extensive history of lymphedema and ulcers. He is being treated at the [facility] by Dr. [name] with [name] boots and the [facility]. He has pumps at home but he is only using them once Shawn day. 08/30/14 selective debridement done of surface eschar. The wound cleans up quite nicely in the bed of this looks healthy. He is using Shawn wound VAC under an Unna wrap. 11/17/14; selective debridement done of surface eschar. Once again the wound beds at clean up quite nicely. He is no longer using Shawn wound VAC. Recent dressing changes include Hydrofera Blue. Apparently he has done Shawn wide variety of different topical dressings including advanced treatment options like Apligraf's without success. 03/21/15; surgical debridement done of surface eschar and nonviable subcutaneous tissue. The area on the medial aspect of the right leg has closed and the wound on the lateral right leg looks improved has been using Silver Collagen 04/11/15; I think attendance here is sporadic. The area on the right medial leg remains healed. He has Shawn new tiny area on the back of the right leg. Again Shawn surgical debridement of the surface eschar and nonviable subcutaneous tissue of the major wound on the right lateral leg. He has been using Silver Collagen and at home, he does his own Unna wraps at  home edema control seems reasonable. 04/20/15; the area on the right medial leg has Shawn very superficial area which may not even be open nevertheless I felt needed to be dressed today [this is his original chronic wound] the new wound is on the right  anterior lateral leg is underwent Shawn surgical debridement. He has Shawn small wound on the right posterior leg. He puts on his own Unna boots, according to our nurses he does this fairly well. We have been using Silver collagen. 05/02/2015 -- I understand in the past his venous studies have been done at [facility] and he was advised weight loss before they would attempt to tackle his iliac vein blockage. He has not gone back for review. 06/27/2015 -- we have applied for Apligraf and are awaiting his insurance clearance. 07/25/2015 -- he still has to use her back from his insurance agent regarding his copayment for his Apligraf. 07/31/2015 -- the patient got Shawn reply from the insurance agent regarding the dollar payment for his application but is not sure whether it is for 5 or for one. 11/28/2015 -- has been hurting Shawn little bit more and he has had change in color of his wound especially on the medial part. 12/05/2015 -- his culture was positive for Proteus mirabilis and K Oxytoca which are sensitive to ciprofloxacin which he is already on. 12/12/15oostill on ciprofloxacin. His mother reports of dressing had to be changed last Friday due to odor. He has not been systemically unwell 12/19/15; patient came in today complaining of increasing pain and tightness in his upper right thigh. He completed the ciprofloxacin 2 or 3 days ago. He is not running Shawn fever today. 12/26/2015 -- last week he had been seen by Dr. [name] who got Shawn lower extremity venous duplex evaluation done which did not show DVT or SVT in the right lower extremity. he had also put him on Augmentin in addition to the previous ciprofloxacin and he had received for 2 weeks 01/02/2016 -- -- was  admitted to the hospital on 12/26/2015 and discharged on 12/29/2015 and was treated for cellulitis. He was also newly diagnosed with type 2 diabetes mellitus and outpatient monitoring and initiation of treatment was recommended. Patientoos hemoglobin A1c was 6.6 No osteomyelitis detected on x-rays and recent Doppler study was negative for DVT Oral doxycycline and Levaquin were recommended for the patient as an . outpatient for Shawn 14 day treatment. IV Zosyn and vancomycin was given due to concerns of Pseudomonas infection while he was in hospital. 02/13/2016 -- he has not gone back to [facility] where he had his vascular opinion earlier and I have urged him to regroup with them to see if there is any surgical options available. 02/27/2016 -- has an appointment to see [facility] vascular surgeons on January 3 and may be seeing Dr. Verdie Drown 03/19/2016 -- I was not able to find any notes on Epic but the patient did go to the vascular clinic at [facility] and saw the PA to Dr. Verdie Drown. I understand she has recommended Shawn CT scan and MRI to be done on February 1 and they would review this with him on the same day. 04/09/2016 -- was admitted to the hospital on 03/20/2016 acute respiratory failure with hypoxia, abdominal discomfort with erythema, hypertensive urgency and chronic wound of the right leg with morbid obesity. he was discharged home on 04/05/2016 and was to continue on IV antibiotics for 18 more days follow-up with the wound center and continue with his cardiologist. he has lost approximately 130 pounds and diuresed over 48 L. He was treated with IV Zosyn and ID recommended he continue this for 3 weeks more. The notes from [facility] wound clinic were noted where he was seen  by the PA and she had taken cultures which grew MSSA and Pseudomonas. She had recommended edema control with Shawn 4-layer compression and also referred him to the lymphedema clinic. Shawn CT venogram was also planned  for the future. 04/16/2016 -- at [facility] Shawn CT pelvic venogram runoff was done on 04/11/2016 -- IMPRESSION:-Limited study secondary to poor opacification of venous structures. No definite evidence of acute deep vein thrombosis. 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 -Extensive right iliac chain and right inguinal adenopathy, favored to be reactive/secondary to congestion. The patient continues on his IV antibiotics through his PICC line and has his cardiology opinion and also Shawn bariatric surgery opinion coming up. 04/30/2016 -- He has completed his IV antibiotics through his PICC line. 05/14/2016 --he was seen by the PA, Ms Sluss, recommended alternate day dressing with compression and use Hibiclens and Dakin's solution with acetic acid on his wounds. 07/30/2016 -- he has still not got his custom-made compression stockings and I have urged him to go and get him self measured for these. 08/06/2016 -- he has been measured for his custom stocking and will get in 2 weeks. He has lost 20 pounds since March 08/27/2016 -- he has not got his custom stockings yet and he tried another pair which has not fit well and he has had Shawn lot of maceration increase the size of his wound. 09/03/2016 -- the patient says that he has not been very compliant with his dressing changes and his elevation and exercise and this week he is notices wound get rather large with maceration. 09/18/16; according to our intake nurse the measurements on this patient's wound are slightly larger. I note previous compliance concerns. I note that his wounds measured larger last week which was noted by Dr. Meyer Russel. Culture done last week was negative. 10/01/2016 -- the patient has received some lymphedema pumps which are new and he says he is being compliant with this. He is going to be away for 2 weeks on vacation to [facility] 10/15/2016 -- he  returns after 2 weeks and tells me he has been diligent with his dressing and has lost 25 pounds over the last 3 months. 10/22/2016 -- his last hemoglobin A1c was 6.2 and he is now diet controlled. 11/19/2016 -- he has been having problems with his compression stockings which are custom made at [facility]. He has not yet been able to get them. He had taken out his compression because he had gone there and had no dressing on when he came here today READMISSION 05/22/2018 [name] is Shawn now 42 year old man with Shawn [place] history of wounds in his lower extremities secondary to chronic venous insufficiency with secondary lymphedema. He has had previous vein ligations on the right although I do not have this information in front of me. 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 He tells me over the last 2 months he has noted mid reopening in the right mid anterior lower extremity. This is Shawn rectangular shaped wound which is kind of odd in terms of how that formed. He has been using silver alginate and Unna boots that we used on him when he was here in 2018. He buys his product supply online thinks this is cheaper than ordering through intermediary's Past medical history; CHF, morbid obesity, hypertension, hyperlipidemia, chronic venous insufficiency,  vein bypass in 2005 previous vein ablations or ligations. He has Shawn stent in the right lower extremity. ABI in this clinic was 1.04. He is using his compression pumps at home 3/27; 2-week follow-up. Right lower extremity wounds related to chronic venous insufficiency and lymphedema. He has been using silver alginate under an Radio broadcast assistant. He change the [employer] himself at home last week. He is making nice progress 4/10; 2-week follow-up. Right lower extremity wound is just about closed Shawn small open area in the middle of the and large rectangular wound he had at the beginning. His  edema control is excellent. He says he is using his compression pumps religiously 4/17; 2-week follow-up. Right lower extremity wound is totally closed. He had Shawn large rectangular wound which is progressively closed. His edema control is very good. He is using his compression pumps once to twice Shawn day READMISSION 09/19/2020 [name] is now Shawn 41 year old man we have had for several stays in this clinic including 2017, 2018 and most recently in 2020 with Shawn wound on his right lower leg. He has compression pumps which he claims to be using twice Shawn day. He also has stockings however I am not sure how much he uses them. Things have broken down over the last month or 2 he has an extensive wound area on the right mid tibial area I think this is similar to what he has had in the past. He has been using Xeroform and an Ace wrap that his girlfriend is applying. Past medical history; the patient has known chronic venous insufficiency with secondary lymphedema. He had an iliac vein blockage and he had Shawn bypass at [facility] although the patient is not followed up with them. The surgeon may have been Dr. Jacolyn Reedy. As noted he has lymphedema pumps. Type 2 diabetes congestive heart failure obstructive sleep apnea. He has Shawn history of Shawn right leg DVT although this may have been the iliac vein he is not currently on anticoagulation ABI in our clinic is 1.37 on the right Imaging Results - in this encounter CT Pelvic Venogram Run Off Imaging Results - CT Pelvic Venogram Run Off Impressions Performed At -Limited study secondary to poor opacification of venous structures. Nodefinite evidence of acute deep vein thrombosis. 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 -Extensive right iliac chain and right inguinal adenopathy, favored to bereactive/secondary to congestion. [facility] Imaging Results - CT Pelvic Venogram Run  Off Narrative Performed At EXAM: CT PELVIC VENOGRAM RUN OFF DATE: 04/11/2016 1:28 PM ACCESSION: [id number] UN DICTATED: 04/11/2016 2:18 PM INTERPRETATION LOCATION: [facility] CLINICAL INDICATION: 41 years old Male with h/o RLE DVT and massive lymphedema of both LE's. Eval for venous outflow obstruction vs extrinsic mass- L97.203-Skin ulcer of calf with necrosis of muscle, unspecified laterality(RAF-HCC) COMPARISON: CT abdomen pelvis dated 02/11/2007 TECHNIQUE: Shawn spiral CT scan was obtained approximately 3 minutes after administration of IV contrast from the kidneys to the knees. Images were reconstructed in the axial plane. Multiplanar reformatted and MIP images were provided for further evaluation of the vessels. For selected cases, 3D volumerendered images are also provided. VASCULAR FINDINGS: There is overall poor contrast opacification of the venous structures, limitingevaluation. IVC: No evidence of thrombus. Iliac veins: The right iliac veins are chronically occluded/diminutive in size. There are extensive venous collaterals noted throughout the right pelvis extending into the right lower extremity. The number and caliber of the venous collaterals have increased  when compared to 02/11/2007. The left iliac veins appear patent. There are also small caliber venous collaterals within the left pelvis extending into the left lower extremity. Anterior abdominal wall venouscollaterals are also noted but fully imaged secondary to patient diameter. The bilateral femoral and popliteal veins appear patent. There is no evidence of acute thrombus. The arterial vasculature is unremarkable on this venous phase time study. NONVASCULAR FINDINGS: LOWER CHEST Unremarkable. : ABDOMEN: HEPATOBILIARY: Unremarkable liver. No biliary ductal dilatation. Gallbladder isremarkable. PANCREAS: Unremarkable. SPLEEN: Unremarkable. ADRENAL GLANDS: Unremarkable. KIDNEYS/URETERS: Unremarkable. BLADDER:  Unremarkable. BOWEL/PERITONEUM/RETROPERITONEUM: No bowel obstruction. No acute inflammatoryprocess. No ascites. LYMPH NODES: Extensive right iliac chain and right inguinal adenopathy. Shawn nodal conglomerate in the right inguinal region measures 6.8 x 3.4 cm (2:124). This is only slightly larger when compared to 02/11/2007. Given interval stabilityover 10 years, these are favored to be reactive/secondary to congestion. REPRODUCTIVE: Unremarkable. BONES/SOFT TISSUES: No acute osseous abnormalities. Postsurgical changes in the right inguinal and left medial thigh region. Right greater than left softtissue edema throughout the lower extremities. [facility] Imaging Results - CT Pelvic Venogram Run Off Procedure Note Interface, Rad Results In - 04/11/2016 3:28 PM EST EXAM: CT PELVIC VENOGRAM RUN OFF DATE: 04/11/2016 1:28 PM ACCESSION: [id number] UN DICTATED: 04/11/2016 2:18 PM INTERPRETATION LOCATION: [facility] CLINICAL INDICATION: 41 years old Male with h/o RLE DVT and massive lymphedema of both LE's. Eval for venous outflow obstruction vs extrinsic mass- L97.203-Skin ulcer of calf with necrosis of muscle, unspecified laterality (RAF-HCC) COMPARISON: CT abdomen pelvis dated 02/11/2007 TECHNIQUE: Shawn spiral CT scan was obtained approximately 3 minutes after administration of IV contrast from the kidneys to the knees. Images were reconstructed in the axial plane. Multiplanar reformatted and MIP images were provided for further evaluation of the vessels. For selected cases, 3D volume rendered images are also provided. VASCULAR FINDINGS: There is overall poor contrast opacification of the venous structures, limiting evaluation. IVC: No evidence of thrombus. Iliac veins: The right iliac veins are chronically occluded/diminutive in size. There are extensive venous collaterals noted throughout the right pelvis extending into the right lower extremity. The number and caliber of the venous collaterals have  increased when compared to 02/11/2007. The left iliac veins appear patent. There are also small caliber venous collaterals within the left pelvis extending into the left lower extremity. Anterior abdominal wall venous collaterals are also noted but fully imaged secondary to patient diameter. The bilateral femoral and popliteal veins appear patent. There is no evidence of acute thrombus. The arterial vasculature is unremarkable on this venous phase time study. NONVASCULAR FINDINGS: LOWER CHEST Unremarkable. : ABDOMEN: HEPATOBILIARY: Unremarkable liver. No biliary ductal dilatation. Gallbladder is remarkable. PANCREAS: Unremarkable. SPLEEN: Unremarkable. ADRENAL GLANDS: Unremarkable. KIDNEYS/URETERS: Unremarkable. BLADDER: Unremarkable. BOWEL/PERITONEUM/RETROPERITONEUM: No bowel obstruction. No acute inflammatory process. No ascites. LYMPH NODES: Extensive right iliac chain and right inguinal adenopathy. Shawn nodal conglomerate in the right inguinal region measures 6.8 x 3.4 cm (2:124). This is only slightly larger when compared to 02/11/2007. Given interval stability over 10 years, these are favored to be reactive/secondary to congestion. REPRODUCTIVE: Unremarkable. BONES/SOFT TISSUES: No acute osseous abnormalities. Postsurgical changes in the right inguinal and left medial thigh region. Right greater than left soft tissue edema throughout the lower extremities. IMPRESSION: -Limited study secondary to poor opacification of venous structures. No definite evidence of acute deep vein thrombosis. 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 -Extensive right iliac chain and right inguinal adenopathy, favored to  be reactive/secondary to congestion. Imaging Results - CT Pelvic Venogram Run Off Performing Organization Address City/State/Zipcode Phone Number [facility] [address] [place], [place]  [address] Surgical summary as listed below; Assessment: [name] is Shawn 41 y.o. male with [place] history of right lower extremity chronic obstructive deep venous disease with recurrent ulceration, absence of right iliac vein, prior left to right femoral vein bypass. Patient continues with recurrent ulceration, his weight precludes him from further vascular studies at this time. If he is able to lose 50-100 pounds we would be able to do left lower extremity venogram possible intervention with 50% chance of re- opening occlusion per Dr. Jacolyn Reedy. For global health, weight loss, potential improvement in his chronic venous insufficiency would appreciateinput from bariatric surgery group for recommendations. 09/26/20; patient comes in today with not much improvement. He has also some odor. 3 open areas and this linear area in his mid calf require debridement. We have been using silver alginate under compression. I gave him Keflex last week for what I thought was cellulitis in the right medial thigh he is completing this and I think this is better. 7/26; patient comes in today with again necrotic debris over these wounds. According to our intake nurse extreme malodor and drainage. We have been using silver alginate under compression. He is complaining about pain in the wound area. 7/29; Patient presents for follow-up. He reports improvement to the wound. He has finished taking Keflex. He has no issues or complaints today. 10/17/2020 upon evaluation today patient appears to be doing poorly in regard to his leg ulcer. He has been tolerating the dressing changes without complication. Fortunately there does not appear to be any signs of active infection at this time. No fevers, chills, nausea, vomiting, or diarrhea. 8/23; the patient has 2 areas anteriorly and Shawn larger area medially on the right lower leg. He comes in today with his compression wrap slipping down. We do not have good edema control. We have been using  silver alginate. I believe he completed Shawn course of antibioticso Levaquin given to him 2 weeks ago at his last visit. He states his girlfriend who is Shawn nurse changes his dressings. We have not seen this in 2 weeks. He just received Shawn compounded topical antibiotic based on I believe Shawn deep tissue culture that was done. I do not have Shawn result of the deep tissue culture today and he forgot the compounded antibiotic. I will try to clarify this next time I tried to send him back to vascular at [facility] for review of his central venous obstruction although they would not even see him I think because of noncompliance, or at least perceived noncompliance with regards to the recommendations for weight loss, consideration of bariatric assessment for surgery etc. 8/30; although the patient's wounds anteriorly on the right lower leg extending medially were debrided today. Better looking wound surface he has better edema control still under 4-layer compression. He tells me he is using his compression pump twice Shawn day however they are greater than 76 years old and he might benefit from Shawn new set. He is using his Jodie Echevaria compounded antibiotic Since the last time he was here he saw Dr. Durwin Nora at vein and vascular at our request. He was felt to have adequate arterial flow for healing although there was still the issue about whether he might need an angiogram if things still. 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 He did not  talk about the s previous central [facility] That information is available in care everywhere however based on the duplex exam he was not felt to be Shawn candidate for any venous procedures on the right. He has no significant venous reflux on the right and has had previously had Shawn right greater saphenous vein ablated 9/6; the patient has Shawn cluster of small areas over the right anterior mid tibia as well as an area medially. That the air 1 medially is  more confluent all of them look like they have Shawn better surface. We have been putting him in 4-layer compression and using his compression pumps twice Shawn day 9/13; both of the patient's wound areas appear somewhat better especially the large area medially. 9/27; 2-week follow-up. His wife who is Shawn Designer, jewelleryregistered nurse has been changing his 4-layer compression with Hydrofera Blue and Keystone I think the measurements of the 2 remaining areas 1 on the right anterior tibia and 1 on the right lateral are better. In another setting I might consider advanced treatment product here. He is using his compression pumps 10/11; patient presents for follow-up. He has no issues or complaints today. His girlfriend continues to change his compression wrap with Hydrofera Blue and [place]. He denies signs of infection. 10/25; patient presents for follow-up. He reports Shawn new wound to the right lateral aspect. He states the area was itching and he scratched developing Shawn wound. He denies signs of infection. 11/11; patient presents for follow-up. He no longer has Shawn wound to the right lateral aspect. He had to use an Radio broadcast assistantUnna boot for the past week due to short supply. He has no issues or complaints today. He denies signs of infection. READMISSION 09/07/2021: The patient is back with new wounds on his right lower extremity. He says that they first emerged around the first of the year. He was seen in the emergency department on June 22 for angioedema either secondary to his ARB or Shawn new lotion that he tried. He was given prednisone. At the same time, the provider in the ER ordered doxycycline to cover his leg ulcer and applied Shawn Unna boot. He did not have an Radio broadcast assistantUnna boot on in clinic today but his significant other and he have been applying Neosporin with Kerlix and Coban wrapping. He denies any fevers or chills. No pain. No significant odor. All of the wounds have the fat layer exposed and there is slough buildup on all open  surfaces. 09/14/2021: All of the wounds are little bit smaller today. They still have slough accumulation on the surface. We have been using silver alginate with 4-layer compression. Shawn culture taken last week demonstrated Shawn polymicrobial population, chiefly Escherichia coli. He had already been prescribed doxycycline in the ER and this was considered to be second line treatment per the PCR culture pharmacist. I did not prescribe any new antibiotics but we did place an order for [facility] topical compounded antibiotic therapy. Patient History Information obtained from Patient. Family History Diabetes - Mother, No family history of Cancer, Heart Disease, Hereditary Spherocytosis, Hypertension, Kidney Disease, Lung Disease, Seizures, Stroke, Thyroid Problems, Tuberculosis. Social History Never smoker, Marital Status - Single, Alcohol Use - Rarely, Drug Use - No History, Caffeine Use - Moderate - tea. Medical History Eyes Denies history of Cataracts, Glaucoma, Optic Neuritis Ear/Nose/Mouth/Throat Denies history of Chronic sinus problems/congestion, Middle ear problems Hematologic/Lymphatic Patient has history of Lymphedema - right leg Denies history of Anemia, Hemophilia, Human Immunodeficiency Virus,  Sickle Cell Disease Respiratory Denies history of Aspiration, Asthma, Chronic Obstructive Pulmonary Disease (COPD), Pneumothorax, Sleep Apnea, Tuberculosis Cardiovascular Patient has history of Deep Vein Thrombosis - hx right leg, Hypertension, Peripheral Venous Disease - venous insufficiency and varicosities Denies history of Angina, Arrhythmia, Congestive Heart Failure, Coronary Artery Disease, Hypotension, Myocardial Infarction, Peripheral Arterial Disease, Phlebitis, Vasculitis Gastrointestinal Denies history of Cirrhosis , Colitis, Crohnoos, Hepatitis Shawn, Hepatitis B, Hepatitis C Endocrine Patient has history of Type II Diabetes Denies history of Type I Diabetes Genitourinary Denies  history of End Stage Renal Disease Immunological Denies history of Lupus Erythematosus, Raynaudoos, Scleroderma Integumentary (Skin) Denies history of History of Burn Musculoskeletal Denies history of Gout, Rheumatoid Arthritis, Osteoarthritis, Osteomyelitis Neurologic Denies history of Dementia, Neuropathy, Quadriplegia, Paraplegia, Seizure Disorder Oncologic Denies history of Received Chemotherapy, Received Radiation Psychiatric Denies history of Anorexia/bulimia, Confinement Anxiety Hospitalization/Surgery History - tonsillectomy. - right knee ligament repair. Medical Shawn Surgical History Notes nd Constitutional Symptoms (General Health) morbid obesity Objective Constitutional Hypertensive, asymptomatic. No acute distress.. Vitals Time Taken: 1:37 PM, Height: 74 in, Weight: 450 lbs, BMI: 57.8, Temperature: 98.8 F, Pulse: 97 bpm, Respiratory Rate: 20 breaths/min, Blood Pressure: 148/100 mmHg. Respiratory Normal work of breathing on room air.. General Notes: 09/14/2021: All of the wounds are little bit smaller today. They still have slough accumulation on the surface. Integumentary (Hair, Skin) Wound #36 status is Open. Original cause of wound was Gradually Appeared. The date acquired was: 03/12/2021. The wound has been in treatment 1 weeks. The wound is located on the Right,Posterior Lower Leg. The wound measures 5.2cm length x 3.6cm width x 0.4cm depth; 14.703cm^2 area and 5.881cm^3 volume. Wound #37 status is Open. Original cause of wound was Gradually Appeared. The date acquired was: 03/12/2021. The wound has been in treatment 1 weeks. The wound is located on the Right,Medial Lower Leg. The wound measures 3.7cm length x 1.9cm width x 0.3cm depth; 5.521cm^2 area and 1.656cm^3 volume. There is Fat Layer (Subcutaneous Tissue) exposed. There is no tunneling or undermining noted. There is Shawn medium amount of serosanguineous drainage noted. The wound margin is distinct with the outline  attached to the wound base. There is medium (34-66%) red granulation within the wound bed. There is Shawn medium (34-66%) amount of necrotic tissue within the wound bed including Adherent Slough. Wound #38 status is Open. Original cause of wound was Gradually Appeared. The date acquired was: 03/12/2021. The wound has been in treatment 1 weeks. The wound is located on the Right,Anterior Lower Leg. The wound measures 1cm length x 0.5cm width x 0.3cm depth; 0.393cm^2 area and 0.118cm^3 volume. There is Fat Layer (Subcutaneous Tissue) exposed. There is no tunneling or undermining noted. There is Shawn medium amount of serosanguineous drainage noted. The wound margin is distinct with the outline attached to the wound base. There is small (1-33%) red granulation within the wound bed. There is Shawn large (67-100%) amount of necrotic tissue within the wound bed including Adherent Slough. Wound #39 status is Open. Original cause of wound was Gradually Appeared. The date acquired was: 03/12/2021. The wound has been in treatment 1 weeks. The wound is located on the Right,Lateral Lower Leg. The wound measures 1.1cm length x 0.8cm width x 0.2cm depth; 0.691cm^2 area and 0.138cm^3 volume. There is Fat Layer (Subcutaneous Tissue) exposed. There is no tunneling or undermining noted. There is Shawn medium amount of serosanguineous drainage noted. The wound margin is distinct with the outline attached to the wound base. There is medium (34-66%) red granulation within the  wound bed. There is Shawn medium (34-66%) amount of necrotic tissue within the wound bed including Adherent Slough. Assessment Active Problems ICD-10 Non-pressure chronic ulcer of other part of right lower leg with fat layer exposed Heart failure, unspecified Essential (primary) hypertension Type 2 diabetes mellitus with other skin ulcer Morbid (severe) obesity due to excess calories Lymphedema, not elsewhere classified Procedures Wound #36 Pre-procedure diagnosis of  Wound #36 is Shawn Venous Leg Ulcer located on the Right,Posterior Lower Leg .Severity of Tissue Pre Debridement is: Fat layer exposed. There was Shawn Excisional Skin/Subcutaneous Tissue Debridement with Shawn total area of 18.72 sq cm performed by Duanne Guess, MD. With the following instrument(s): Curette to remove Non-Viable tissue/material. Material removed includes Eschar, Subcutaneous Tissue, and Slough after achieving pain control using Lidocaine 5% topical ointment. No specimens were taken. Shawn time out was conducted at 14:00, prior to the start of the procedure. Shawn Minimum amount of bleeding was controlled with Pressure. The procedure was tolerated well with Shawn pain level of 0 throughout and Shawn pain level of 0 following the procedure. Post Debridement Measurements: 5.2cm length x 3.6cm width x 0.4cm depth; 5.881cm^3 volume. Character of Wound/Ulcer Post Debridement is improved. Severity of Tissue Post Debridement is: Fat layer exposed. Post procedure Diagnosis Wound #36: Same as Pre-Procedure Pre-procedure diagnosis of Wound #36 is Shawn Venous Leg Ulcer located on the Right,Posterior Lower Leg . There was Shawn Four Layer Compression Therapy Procedure by Karie Schwalbe, RN. Post procedure Diagnosis Wound #36: Same as Pre-Procedure Wound #37 Pre-procedure diagnosis of Wound #37 is Shawn Venous Leg Ulcer located on the Right,Medial Lower Leg .Severity of Tissue Pre Debridement is: Fat layer exposed. There was Shawn Excisional Skin/Subcutaneous Tissue Debridement with Shawn total area of 7.03 sq cm performed by Duanne Guess, MD. With the following instrument(s): Curette to remove Non-Viable tissue/material. Material removed includes Eschar, Subcutaneous Tissue, and Slough after achieving pain control using Lidocaine 5% topical ointment. No specimens were taken. Shawn time out was conducted at 14:00, prior to the start of the procedure. Shawn Minimum amount of bleeding was controlled with Pressure. The procedure was tolerated  well with Shawn pain level of 0 throughout and Shawn pain level of 0 following the procedure. Post Debridement Measurements: 3.7cm length x 1.9cm width x 0.3cm depth; 1.656cm^3 volume. Character of Wound/Ulcer Post Debridement is improved. Severity of Tissue Post Debridement is: Fat layer exposed. Post procedure Diagnosis Wound #37: Same as Pre-Procedure Pre-procedure diagnosis of Wound #37 is Shawn Venous Leg Ulcer located on the Right,Medial Lower Leg . There was Shawn Four Layer Compression Therapy Procedure by Karie Schwalbe, RN. Post procedure Diagnosis Wound #37: Same as Pre-Procedure Wound #38 Pre-procedure diagnosis of Wound #38 is Shawn Venous Leg Ulcer located on the Right,Anterior Lower Leg .Severity of Tissue Pre Debridement is: Fat layer exposed. There was Shawn Excisional Skin/Subcutaneous Tissue Debridement with Shawn total area of 0.5 sq cm performed by Duanne Guess, MD. With the following instrument(s): Curette to remove Non-Viable tissue/material. Material removed includes Eschar, Subcutaneous Tissue, and Slough after achieving pain control using Lidocaine 5% topical ointment. No specimens were taken. Shawn time out was conducted at 14:00, prior to the start of the procedure. Shawn Minimum amount of bleeding was controlled with Pressure. The procedure was tolerated well with Shawn pain level of 0 throughout and Shawn pain level of 0 following the procedure. Post Debridement Measurements: 1cm length x 0.5cm width x 0.3cm depth; 0.118cm^3 volume. Character of Wound/Ulcer Post Debridement is improved. Severity of Tissue Post Debridement  is: Fat layer exposed. Post procedure Diagnosis Wound #38: Same as Pre-Procedure Pre-procedure diagnosis of Wound #38 is Shawn Venous Leg Ulcer located on the Right,Anterior Lower Leg . There was Shawn Four Layer Compression Therapy Procedure by Karie Schwalbe, RN. Post procedure Diagnosis Wound #38: Same as Pre-Procedure Wound #39 Pre-procedure diagnosis of Wound #39 is Shawn Venous Leg Ulcer  located on the Right,Lateral Lower Leg .Severity of Tissue Pre Debridement is: Fat layer exposed. There was Shawn Excisional Skin/Subcutaneous Tissue Debridement with Shawn total area of 0.88 sq cm performed by Duanne Guess, MD. With the following instrument(s): Curette to remove Non-Viable tissue/material. Material removed includes Eschar, Subcutaneous Tissue, and Slough after achieving pain control using Lidocaine 5% topical ointment. No specimens were taken. Shawn time out was conducted at 14:00, prior to the start of the procedure. Shawn Minimum amount of bleeding was controlled with Pressure. The procedure was tolerated well with Shawn pain level of 0 throughout and Shawn pain level of 0 following the procedure. Post Debridement Measurements: 1.1cm length x 0.8cm width x 0.2cm depth; 0.138cm^3 volume. Character of Wound/Ulcer Post Debridement is improved. Severity of Tissue Post Debridement is: Fat layer exposed. Post procedure Diagnosis Wound #39: Same as Pre-Procedure Pre-procedure diagnosis of Wound #39 is Shawn Venous Leg Ulcer located on the Right,Lateral Lower Leg . There was Shawn Four Layer Compression Therapy Procedure by Karie Schwalbe, RN. Post procedure Diagnosis Wound #39: Same as Pre-Procedure Plan Follow-up Appointments: Return appointment in 3 weeks. - Dr. Lady Gary Room 2 Friday 7/14 at 8:30am Bathing/ Shower/ Hygiene: May shower with protection but do not get wound dressing(s) wet. - May use Shawn cast wrap to put over wraps to shower; you can buy these at [facility] or [facility] Edema Control - Lymphedema / SCD / Other: Lymphedema Pumps. Use Lymphedema pumps on leg(s) 2-3 times Shawn day for 45-60 minutes. If wearing any wraps or hose, do not remove them. Continue exercising as instructed. Elevate legs to the level of the heart or above for 30 minutes daily and/or when sitting, Shawn frequency of: - throughout the day Avoid standing for Begay periods of time. Patient to wear own compression stockings every day. - on Left  Leg WOUND #36: - Lower Leg Wound Laterality: Right, Posterior Cleanser: Normal Saline 1 x Per Week/30 Days Discharge Instructions: Cleanse the wound with Normal Saline prior to applying Shawn clean dressing using gauze sponges, not tissue or cotton balls. Cleanser: Soap and Water 1 x Per Week/30 Days Discharge Instructions: May shower and wash wound with dial antibacterial soap and water prior to dressing change. Prim Dressing: KerraCel Ag Gelling Fiber Dressing, 4x5 in (silver alginate) 1 x Per Week/30 Days ary Discharge Instructions: Apply silver alginate to wound bed as instructed Secondary Dressing: ABD Pad, 8x10 1 x Per Week/30 Days Discharge Instructions: Apply over primary dressing as directed. Secondary Dressing: Woven Gauze Sponge, Non-Sterile 4x4 in 1 x Per Week/30 Days Discharge Instructions: Apply over primary dressing as directed. Secured With: Paper T ape, 2x10 (in/yd) 1 x Per Week/30 Days Discharge Instructions: Secure dressing with tape as directed. Com pression Wrap: FourPress (4 layer compression wrap) 1 x Per Week/30 Days Discharge Instructions: Apply four layer compression as directed. May also use Miliken CoFlex 2 layer compression system as alternative. WOUND #37: - Lower Leg Wound Laterality: Right, Medial Cleanser: Normal Saline 1 x Per Week/30 Days Discharge Instructions: Cleanse the wound with Normal Saline prior to applying Shawn clean dressing using gauze sponges, not tissue or cotton balls. Cleanser: Soap  and Water 1 x Per Week/30 Days Discharge Instructions: May shower and wash wound with dial antibacterial soap and water prior to dressing change. Prim Dressing: KerraCel Ag Gelling Fiber Dressing, 4x5 in (silver alginate) 1 x Per Week/30 Days ary Discharge Instructions: Apply silver alginate to wound bed as instructed Secondary Dressing: ABD Pad, 8x10 1 x Per Week/30 Days Discharge Instructions: Apply over primary dressing as directed. Secondary Dressing: Woven Gauze  Sponge, Non-Sterile 4x4 in 1 x Per Week/30 Days Discharge Instructions: Apply over primary dressing as directed. Secured With: Paper T ape, 2x10 (in/yd) 1 x Per Week/30 Days Discharge Instructions: Secure dressing with tape as directed. Com pression Wrap: FourPress (4 layer compression wrap) 1 x Per Week/30 Days Discharge Instructions: Apply four layer compression as directed. May also use Miliken CoFlex 2 layer compression system as alternative. WOUND #38: - Lower Leg Wound Laterality: Right, Anterior Cleanser: Normal Saline 1 x Per Week/30 Days Discharge Instructions: Cleanse the wound with Normal Saline prior to applying Shawn clean dressing using gauze sponges, not tissue or cotton balls. Cleanser: Soap and Water 1 x Per Week/30 Days Discharge Instructions: May shower and wash wound with dial antibacterial soap and water prior to dressing change. Prim Dressing: KerraCel Ag Gelling Fiber Dressing, 4x5 in (silver alginate) 1 x Per Week/30 Days ary Discharge Instructions: Apply silver alginate to wound bed as instructed Secondary Dressing: ABD Pad, 8x10 1 x Per Week/30 Days Discharge Instructions: Apply over primary dressing as directed. Secondary Dressing: Woven Gauze Sponge, Non-Sterile 4x4 in 1 x Per Week/30 Days Discharge Instructions: Apply over primary dressing as directed. Secured With: Paper T ape, 2x10 (in/yd) 1 x Per Week/30 Days Discharge Instructions: Secure dressing with tape as directed. Com pression Wrap: FourPress (4 layer compression wrap) 1 x Per Week/30 Days Discharge Instructions: Apply four layer compression as directed. May also use Miliken CoFlex 2 layer compression system as alternative. WOUND #39: - Lower Leg Wound Laterality: Right, Lateral Cleanser: Normal Saline 1 x Per Week/30 Days Discharge Instructions: Cleanse the wound with Normal Saline prior to applying Shawn clean dressing using gauze sponges, not tissue or cotton balls. Cleanser: Soap and Water 1 x Per Week/30  Days Discharge Instructions: May shower and wash wound with dial antibacterial soap and water prior to dressing change. Prim Dressing: KerraCel Ag Gelling Fiber Dressing, 4x5 in (silver alginate) 1 x Per Week/30 Days ary Discharge Instructions: Apply silver alginate to wound bed as instructed Secondary Dressing: ABD Pad, 8x10 1 x Per Week/30 Days Discharge Instructions: Apply over primary dressing as directed. Secondary Dressing: Woven Gauze Sponge, Non-Sterile 4x4 in 1 x Per Week/30 Days Discharge Instructions: Apply over primary dressing as directed. Secured With: Paper T ape, 2x10 (in/yd) 1 x Per Week/30 Days Discharge Instructions: Secure dressing with tape as directed. Com pression Wrap: FourPress (4 layer compression wrap) 1 x Per Week/30 Days Discharge Instructions: Apply four layer compression as directed. May also use Miliken CoFlex 2 layer compression system as alternative. 09/14/2021: All of the wounds are little bit smaller today. They still have slough accumulation on the surface. I used Shawn curette to debride slough, eschar, and nonviable subcutaneous tissue from all of his wounds. We will continue using silver alginate and 4-layer compression. Hopefully his Jodie Echevaria topical compound will be delivered prior to his next visit. We will see him in 1 week. Electronic Signature(s) Signed: 09/14/2021 2:26:09 PM By: Duanne Guess MD FACS Entered By: Duanne Guess on 09/14/2021 14:26:09 -------------------------------------------------------------------------------- HxROS Details Patient Name: Date of Service:  [name], Shawn DRIA N T. 09/14/2021 1:15 PM Medical Record Number: [id number] Patient Account Number: [id number] Date of Birth/Sex: Treating RN: Jul 10, 1980 (41 y.o. Dianna Limbo Primary Care Provider: Georganna Skeans Other Clinician: Referring Provider: Treating Provider/Extender: Kathreen Cosier in Treatment: 1 Information Obtained  From Patient Constitutional Symptoms (General Health) Medical History: Past Medical History Notes: morbid obesity Eyes Medical History: Negative for: Cataracts; Glaucoma; Optic Neuritis Ear/Nose/Mouth/Throat Medical History: Negative for: Chronic sinus problems/congestion; Middle ear problems Hematologic/Lymphatic Medical History: Positive for: Lymphedema - right leg Negative for: Anemia; Hemophilia; Human Immunodeficiency Virus; Sickle Cell Disease Respiratory Medical History: Negative for: Aspiration; Asthma; Chronic Obstructive Pulmonary Disease (COPD); Pneumothorax; Sleep Apnea; Tuberculosis Cardiovascular Medical History: Positive for: Deep Vein Thrombosis - hx right leg; Hypertension; Peripheral Venous Disease - venous insufficiency and varicosities Negative for: Angina; Arrhythmia; Congestive Heart Failure; Coronary Artery Disease; Hypotension; Myocardial Infarction; Peripheral Arterial Disease; Phlebitis; Vasculitis Gastrointestinal Medical History: Negative for: Cirrhosis ; Colitis; Crohns; Hepatitis Shawn; Hepatitis B; Hepatitis C Endocrine Medical History: Positive for: Type II Diabetes Negative for: Type I Diabetes Time with diabetes: 3 years Treated with: Oral agents, Diet Blood sugar tested every day: No Genitourinary Medical History: Negative for: End Stage Renal Disease Immunological Medical History: Negative for: Lupus Erythematosus; Raynauds; Scleroderma Integumentary (Skin) Medical History: Negative for: History of Burn Musculoskeletal Medical History: Negative for: Gout; Rheumatoid Arthritis; Osteoarthritis; Osteomyelitis Neurologic Medical History: Negative for: Dementia; Neuropathy; Quadriplegia; Paraplegia; Seizure Disorder Oncologic Medical History: Negative for: Received Chemotherapy; Received Radiation Psychiatric Medical History: Negative for: Anorexia/bulimia; Confinement Anxiety Immunizations Pneumococcal Vaccine: Received Pneumococcal  Vaccination: No Implantable Devices None Hospitalization / Surgery History Type of Hospitalization/Surgery tonsillectomy right knee ligament repair Family and Social History Cancer: No; Diabetes: Yes - Mother; Heart Disease: No; Hereditary Spherocytosis: No; Hypertension: No; Kidney Disease: No; Lung Disease: No; Seizures: No; Stroke: No; Thyroid Problems: No; Tuberculosis: No; Never smoker; Marital Status - Single; Alcohol Use: Rarely; Drug Use: No History; Caffeine Use: Moderate - tea; Financial Concerns: No; Food, Clothing or Shelter Needs: No; Support System Lacking: No; Transportation Concerns: No Electronic Signature(s) Signed: 09/14/2021 2:46:22 PM By: Duanne Guess MD FACS Signed: 09/14/2021 5:16:37 PM By: Karie Schwalbe RN Entered By: Duanne Guess on 09/14/2021 14:20:58 -------------------------------------------------------------------------------- SuperBill Details Patient Name: Date of Service: [name], Shawn Oppenheim T. 09/14/2021 Medical Record Number: [id number] Patient Account Number: [id number] Date of Birth/Sex: Treating RN: [birth year] (41 y.o. Dianna Limbo Primary Care Provider: Georganna Skeans Other Clinician: Referring Provider: Treating Provider/Extender: Kathreen Cosier in Treatment: 1 Diagnosis Coding ICD-10 Codes Code Description [phone] Non-pressure chronic ulcer of other part of right lower leg with fat layer exposed I50.9 Heart failure, unspecified I10 Essential (primary) hypertension E11.622 Type 2 diabetes mellitus with other skin ulcer E66.01 Morbid (severe) obesity due to excess calories I89.0 Lymphedema, not elsewhere classified Facility Procedures CPT4 Code: [id number] Description: 11042 - DEB SUBQ TISSUE 20 SQ CM/< ICD-10 Diagnosis Description L97.812 Non-pressure chronic ulcer of other part of right lower leg with fat layer exp Modifier: osed Quantity: 1 CPT4 Code: [id number] Description: 11045 - DEB SUBQ TISS EA ADDL  20CM ICD-10 Diagnosis Description L97.812 Non-pressure chronic ulcer of other part of right lower leg with fat layer exp Modifier: osed Quantity: 1 Physician Procedures : CPT4 Code Description Modifier [id number] - WC PHYS LEVEL 3 - EST PT 25 ICD-10 Diagnosis Description I50.9 Heart failure, unspecified L97.812 Non-pressure chronic ulcer of other part of right lower leg with fat layer exposed E11.622 Type  2 diabetes  mellitus with other skin ulcer I89.0 Lymphedema, not elsewhere classified Quantity: 1 : [id number] 11042 - WC PHYS SUBQ TISS 20 SQ CM ICD-10 Diagnosis Description L97.812 Non-pressure chronic ulcer of other part of right lower leg with fat layer exposed Quantity: 1 : [id number] 11045 - WC PHYS SUBQ TISS EA ADDL 20 CM ICD-10 Diagnosis Description L97.812 Non-pressure chronic ulcer of other part of right lower leg with fat layer exposed Quantity: 1 Electronic Signature(s) Signed: 09/14/2021 2:26:46 PM By: Duanne Guess MD FACS Entered By: Duanne Guess on 09/14/2021 14:26:46

## 2021-09-17 ENCOUNTER — Telehealth (HOSPITAL_BASED_OUTPATIENT_CLINIC_OR_DEPARTMENT_OTHER): Payer: Self-pay | Admitting: Cardiovascular Disease

## 2021-09-17 DIAGNOSIS — G4733 Obstructive sleep apnea (adult) (pediatric): Secondary | ICD-10-CM

## 2021-09-17 NOTE — Telephone Encounter (Signed)
There is an order for auto pap setting for the patient. Patient want to keep the same machine he has but he has cpap machine. need order to say cpap instead. He doesn't have money for a new machine. Please advise

## 2021-09-17 NOTE — Telephone Encounter (Signed)
Please advise 

## 2021-09-21 ENCOUNTER — Encounter (HOSPITAL_BASED_OUTPATIENT_CLINIC_OR_DEPARTMENT_OTHER): Payer: Self-pay | Admitting: Internal Medicine

## 2021-09-21 ENCOUNTER — Emergency Department (HOSPITAL_COMMUNITY)
Admission: EM | Admit: 2021-09-21 | Discharge: 2021-09-21 | Disposition: A | Discharge status: Home / Self Care | Payer: Self-pay | Attending: Emergency Medicine | Admitting: Emergency Medicine

## 2021-09-21 ENCOUNTER — Other Ambulatory Visit: Payer: Self-pay

## 2021-09-21 ENCOUNTER — Emergency Department (HOSPITAL_COMMUNITY): Payer: Self-pay

## 2021-09-21 DIAGNOSIS — S81801A Unspecified open wound, right lower leg, initial encounter: Secondary | ICD-10-CM | POA: Insufficient documentation

## 2021-09-21 DIAGNOSIS — X58XXXA Exposure to other specified factors, initial encounter: Secondary | ICD-10-CM | POA: Insufficient documentation

## 2021-09-21 DIAGNOSIS — T7840XA Allergy, unspecified, initial encounter: Secondary | ICD-10-CM | POA: Insufficient documentation

## 2021-09-21 DIAGNOSIS — Z91010 Allergy to peanuts: Secondary | ICD-10-CM | POA: Insufficient documentation

## 2021-09-21 DIAGNOSIS — Z91040 Latex allergy status: Secondary | ICD-10-CM | POA: Insufficient documentation

## 2021-09-21 LAB — I-STAT CHEM 8, ED
BUN: 15 mg/dL (ref 6–20)
Calcium, Ion: 1.09 mmol/L — ABNORMAL LOW (ref 1.15–1.40)
Chloride: 101 mmol/L (ref 98–111)
Creatinine, Ser: 1.3 mg/dL — ABNORMAL HIGH (ref 0.61–1.24)
Glucose, Bld: 191 mg/dL — ABNORMAL HIGH (ref 70–99)
HCT: 42 % (ref 39.0–52.0)
Hemoglobin: 14.3 g/dL (ref 13.0–17.0)
Potassium: 3.7 mmol/L (ref 3.5–5.1)
Sodium: 139 mmol/L (ref 135–145)
TCO2: 27 mmol/L (ref 22–32)

## 2021-09-21 LAB — BLOOD GAS, ARTERIAL
Acid-Base Excess: 2 mmol/L (ref 0.0–2.0)
Acid-Base Excess: 0.3 mmol/L (ref 0.0–2.0)
Bicarbonate: 27.8 mmol/L (ref 20.0–28.0)
Bicarbonate: 26.5 mmol/L (ref 20.0–28.0)
Drawn by: 22052
FIO2: 36 %
O2 Content: 4 L/min
O2 Saturation: 89.7 %
O2 Saturation: 96.5 %
Patient temperature: 37
Patient temperature: 37
pCO2 arterial: 47 mmHg (ref 32–48)
pCO2 arterial: 48 mmHg (ref 32–48)
pH, Arterial: 7.38 (ref 7.35–7.45)
pH, Arterial: 7.35 (ref 7.35–7.45)
pO2, Arterial: 59 mmHg — ABNORMAL LOW (ref 83–108)
pO2, Arterial: 78 mmHg — ABNORMAL LOW (ref 83–108)

## 2021-09-21 LAB — GLUCOSE, CAPILLARY: Glucose-Capillary: 142 mg/dL — ABNORMAL HIGH (ref 70–99)

## 2021-09-21 MED ORDER — SODIUM CHLORIDE 0.9 % IV BOLUS
1000.0000 mL | Freq: Once | INTRAVENOUS | Status: AC
  Administered 2021-09-21: 1000 mL via INTRAVENOUS

## 2021-09-21 MED ORDER — SODIUM CHLORIDE 0.9 % IV SOLN: INTRAVENOUS | Status: DC

## 2021-09-21 MED ORDER — METHYLPREDNISOLONE SODIUM SUCC 125 MG IJ SOLR
125.0000 mg | Freq: Once | INTRAMUSCULAR | Status: AC
  Administered 2021-09-21: 125 mg via INTRAVENOUS
  Filled 2021-09-21: qty 2

## 2021-09-21 MED ORDER — PREDNISONE 10 MG PO TABS: 20.0000 mg | ORAL_TABLET | Freq: Two times a day (BID) | ORAL | 0 refills | Status: DC

## 2021-09-21 MED ORDER — EPINEPHRINE 0.3 MG/0.3ML IJ SOAJ: 0.3000 mg | INTRAMUSCULAR | 0 refills | Status: AC | PRN

## 2021-09-21 MED ORDER — FAMOTIDINE 20 MG PO TABS
20.0000 mg | ORAL_TABLET | Freq: Once | ORAL | Status: AC
  Administered 2021-09-21: 20 mg via ORAL
  Filled 2021-09-21: qty 1

## 2021-09-21 MED ORDER — EPINEPHRINE 0.3 MG/0.3ML IJ SOAJ
0.3000 mg | Freq: Once | INTRAMUSCULAR | Status: AC
  Administered 2021-09-21: 0.3 mg via INTRAMUSCULAR
  Filled 2021-09-21: qty 0.3

## 2021-09-21 NOTE — Progress Notes (Signed)
ABG complete, sent to lab and called

## 2021-09-21 NOTE — ED Triage Notes (Signed)
Pt. Came from a wound clinic where he received lidocaine . Pt. States he is allergic to lidocaine, after administration pt. Broke out in hives across his chest and respiratory distress.

## 2021-09-21 NOTE — Progress Notes (Signed)
ABG completed , sent to lab 

## 2021-09-21 NOTE — Progress Notes (Signed)
Ishman, Breyer T ([id number]) . Visit Report for 09/21/2021 Arrival Information Details Patient Name: Date of Service: [name] 09/21/2021 8:30 Shawn M Medical Record Number: [id number] Patient Account Number: [id number] Date of Birth/Sex: Treating RN: [birth year] (41 y.o. Marlan Palau Primary Care Eufemia Prindle: Georganna Skeans Other Clinician: Referring Kasumi Ditullio: Treating Keilyn Nadal/Extender: Marcie Bal in Treatment: 2 Visit Information History Since Last Visit Added or deleted any medications: No Patient Arrived: Ambulatory Any new allergies or adverse reactions: No Arrival Time: 08:51 Had Shawn fall or experienced change in No Accompanied By: self activities of daily living that may affect Transfer Assistance: None risk of falls: Patient Identification Verified: Yes Signs or symptoms of abuse/neglect since last visito No Secondary Verification Process Completed: Yes Hospitalized since last visit: No Patient Requires Transmission-Based Precautions: No Implantable device outside of the clinic excluding No Patient Has Alerts: No cellular tissue based products placed in the center since last visit: Has Dressing in Place as Prescribed: Yes Has Compression in Place as Prescribed: Yes Pain Present Now: No Electronic Signature(s) Signed: 09/21/2021 5:02:52 PM By: Samuella Bruin Entered By: Samuella Bruin on 09/21/2021 08:52:18 -------------------------------------------------------------------------------- Clinic Level of Care Assessment Details Patient Name: Date of Service: [name], Shawn Oppenheim T. 09/21/2021 8:30 Shawn M Medical Record Number: [id number] Patient Account Number: [id number] Date of Birth/Sex: Treating RN: 1980/12/28 (41 y.o. Marlan Palau Primary Care Comer Devins: Georganna Skeans Other Clinician: Referring Allaya Abbasi: Treating Tokiko Diefenderfer/Extender: Marcie Bal in Treatment: 2 Clinic Level of Care Assessment  Items TOOL 4 Quantity Score X- 1 0 Use when only an EandM is performed on FOLLOW-UP visit ASSESSMENTS - Nursing Assessment / Reassessment X- 1 10 Reassessment of Co-morbidities (includes updates in patient status) X- 1 5 Reassessment of Adherence to Treatment Plan ASSESSMENTS - Wound and Skin Shawn ssessment / Reassessment  - 0 Simple Wound Assessment / Reassessment - one wound X- 4 5 Complex Wound Assessment / Reassessment - multiple wounds  - 0 Dermatologic / Skin Assessment (not related to wound area) ASSESSMENTS - Focused Assessment X- 1 5 Circumferential Edema Measurements - multi extremities  - 0 Nutritional Assessment / Counseling / Intervention X- 1 5 Lower Extremity Assessment (monofilament, tuning fork, pulses)  - 0 Peripheral Arterial Disease Assessment (using hand held doppler) ASSESSMENTS - Ostomy and/or Continence Assessment and Care  - 0 Incontinence Assessment and Management  - 0 Ostomy Care Assessment and Management (repouching, etc.) PROCESS - Coordination of Care X - Simple Patient / Family Education for ongoing care 1 15  - 0 Complex (extensive) Patient / Family Education for ongoing care X- 1 10 Staff obtains Chiropractor, Records, T Results / Process Orders est  - 0 Staff telephones HHA, Nursing Homes / Clarify orders / etc  - 0 Routine Transfer to another Facility (non-emergent condition)  - 0 Routine Hospital Admission (non-emergent condition)  - 0 New Admissions / Manufacturing engineer / Ordering NPWT Apligraf, etc. , X- 1 20 Emergency Hospital Admission (emergent condition) X- 1 10 Simple Discharge Coordination  - 0 Complex (extensive) Discharge Coordination PROCESS - Special Needs  - 0 Pediatric / Minor Patient Management  - 0 Isolation Patient Management  - 0 Hearing / Language / Visual special needs  - 0 Assessment of Community assistance (transportation, D/C planning, etc.)  - 0 Additional  assistance / Altered mentation  - 0 Support Surface(s) Assessment (bed, cushion, seat, etc.) INTERVENTIONS - Wound Cleansing / Measurement  - 0 Simple Wound Cleansing - one  wound X- 1 5 Complex Wound Cleansing - multiple wounds X- 1 5 Wound Imaging (photographs - any number of wounds)  - 0 Wound Tracing (instead of photographs) X- 1 5 Simple Wound Measurement - one wound  - 0 Complex Wound Measurement - multiple wounds INTERVENTIONS - Wound Dressings  - 0 Small Wound Dressing one or multiple wounds X- 1 15 Medium Wound Dressing one or multiple wounds  - 0 Large Wound Dressing one or multiple wounds  - 0 Application of Medications - topical  - 0 Application of Medications - injection INTERVENTIONS - Miscellaneous  - 0 External ear exam  - 0 Specimen Collection (cultures, biopsies, blood, body fluids, etc.)  - 0 Specimen(s) / Culture(s) sent or taken to Lab for analysis  - 0 Patient Transfer (multiple staff / Nurse, adult / Similar devices)  - 0 Simple Staple / Suture removal (25 or less)  - 0 Complex Staple / Suture removal (26 or more)  - 0 Hypo / Hyperglycemic Management (close monitor of Blood Glucose)  - 0 Ankle / Brachial Index (ABI) - do not check if billed separately X- 1 5 Vital Signs Has the patient been seen at the hospital within the last three years: Yes Total Score: 135 Level Of Care: New/Established - Level 4 Electronic Signature(s) Signed: 09/21/2021 5:02:52 PM By: Samuella Bruin Entered By: Samuella Bruin on 09/21/2021 11:37:19 -------------------------------------------------------------------------------- Encounter Discharge Information Details Patient Name: Date of Service: [name], Shawn Oppenheim T. 09/21/2021 8:30 Shawn M Medical Record Number: [id number] Patient Account Number: [id number] Date of Birth/Sex: Treating RN: Oct 22, 1980 (41 y.o. Marlan Palau Primary Care Deuntae Kocsis: Georganna Skeans Other  Clinician: Referring Tyrus Wilms: Treating Escarlet Saathoff/Extender: Marcie Bal in Treatment: 2 Encounter Discharge Information Items Discharge Condition: Unstable Ambulatory Status: Stretcher Discharge Destination: Hospital Telephoned: Yes Spoke With: EMS Orders Sent: No Transportation: Ambulance Accompanied By: self Schedule Follow-up Appointment: Yes Clinical Summary of Care: Patient Declined Notes I applied Shawn thin layer of lidocaine the patient's wounds. I left the room and returned with the doctor where I noticed the patient had removed the lidocaine gauze. He did not say anything. The doctor evaluated him and left. I applied the ordered wound care. The patient told me his leg was itching but he said it would probably go away. I notified Dr. [name] of the itching. The patient was discharged and went out to the lobby. Daryll Drown, RN was stopped by the patient and asked her to walk him to his car because he thought he might be having Shawn panic attack. Bonita Quin noticed he did not look good, so she brought him back into Shawn treatment room where we proceeded to take his vital signs. At 1000 his vitals were 110/66 blood pressure, 60 pulse, blood sugar of 142. He was complaining of being itchy all over, hot, and sweaty. We put him on O2 on Shawn 100% rebreather mask where his O2 stats never got above 78%. Abena called EMS four times for an emergent anaphylactic reaction. Linda and I continued to monitor his vitals and stay with the patient until EMS arrived. EMS arrived and moved him over to Shawn stretcher and gave him two chewable Benadryl's. He was then transported to the emergency room. We notified Karel Jarvis, the patient's sister, and she stated she was going to meet him at the ER. The patient's vitals at 1007 were 115/69 blood pressure, 77 pulse, 97.6 temperature. The patient's vitals at 1020 were 100/52 blood pressure, 66 pulse, 73% O2. The  patient's vitals at 1022 were 92/54 blood pressure,  77% O2. The patient's vitals at 1031 were 93/43 blood pressure, 87 pulse. The patient's vitals at 1036 were 83/53 blood pressure, 80 pulse, 75% O2. Electronic Signature(s) Signed: 09/21/2021 5:02:52 PM By: Samuella Bruin Entered By: Samuella Bruin on 09/21/2021 12:33:03 -------------------------------------------------------------------------------- General Visit Notes Details Patient Name: Date of Service: [name], Shawn DRIA N T. 09/21/2021 8:30 Shawn M Medical Record Number: [id number] Patient Account Number: [id number] Date of Birth/Sex: Treating RN: [birth year] (41 y.o. Marlan Palau Primary Care Avni Traore: Georganna Skeans Other Clinician: Referring Jissel Slavens: Treating Zsofia Prout/Extender: Marcie Bal in Treatment: 2 Notes I applied Shawn thin layer of lidocaine the patient's wounds. I left the room and returned with the doctor where I noticed the patient had removed the lidocaine gauze. He did not say anything. The doctor evaluated him and left. I applied the ordered wound care. The patient told me his leg was itching but he said it would probably go away. I notified Dr. [name] of the itching. The patient was discharged and went out to the lobby. Daryll Drown, RN was stopped by the patient and asked her to walk him to his car because he thought he might be having Shawn panic attack. Bonita Quin noticed he did not look good, so she brought him back into Shawn treatment room where we proceeded to take his vital signs. At 1000 his vitals were 110/66 blood pressure, 60 pulse, blood sugar of 142. He was complaining of being itchy all over, hot, and sweaty. We put him on O2 on Shawn 100% rebreather mask where his O2 stats never got above 78%. Abena called EMS four times for an emergent anaphylactic reaction. Linda and I continued to monitor his vitals and stay with the patient until EMS arrived. EMS arrived and moved him over to Shawn stretcher and gave him two chewable Benadryl's. He was then  transported to the emergency room. We notified Karel Jarvis, the patient's sister, and she stated she was going to meet him at the ER. The patient's vitals at 1007 were 115/69 blood pressure, 77 pulse, 97.6 temperature. The patient's vitals at 1020 were 100/52 blood pressure, 66 pulse, 73% O2. The patient's vitals at 1022 were 92/54 blood pressure, 77% O2. The patient's vitals at 1031 were 93/43 blood pressure, 87 pulse. The patient's vitals at 1036 were 83/53 blood pressure, 80 pulse, 75% O2. Electronic Signature(s) Signed: 09/21/2021 5:02:52 PM By: Samuella Bruin Entered By: Samuella Bruin on 09/21/2021 12:32:43 -------------------------------------------------------------------------------- Lower Extremity Assessment Details Patient Name: Date of Service: Shawn Bering T. 09/21/2021 8:30 Shawn M Medical Record Number: [id number] Patient Account Number: [id number] Date of Birth/Sex: Treating RN: 06/05/1980 (41 y.o. Marlan Palau Primary Care Coren Sagan: Georganna Skeans Other Clinician: Referring Sherril Heyward: Treating Kleigh Hoelzer/Extender: Marcie Bal in Treatment: 2 Edema Assessment Assessed: Kyra Searles: No] [Right: No] Edema: [Left: Ye] [Right: s] Calf Left: Right: Point of Measurement: 32 cm From Medial Instep 59.5 cm Ankle Left: Right: Point of Measurement: 11 cm From Medial Instep 36.5 cm Vascular Assessment Pulses: Dorsalis Pedis Palpable: [Left:Yes] Electronic Signature(s) Signed: 09/21/2021 5:02:52 PM By: Samuella Bruin Entered By: Samuella Bruin on 09/21/2021 08:57:46 -------------------------------------------------------------------------------- Multi Wound Chart Details Patient Name: Date of Service: [name], Shawn Oppenheim T. 09/21/2021 8:30 Shawn M Medical Record Number: [id number] Patient Account Number: [id number] Date of Birth/Sex: Treating RN: January 13, 1981 (41 y.o. Marlan Palau Primary Care Emmylou Bieker: Georganna Skeans Other Clinician: Referring  Leianne Callins: Treating Millenia Waldvogel/Extender: Baltazar Najjar  Georganna SkeansWilson, Amelia Weeks in Treatment: 2 Vital Signs Height(in): 74 Pulse(bpm): 76 Weight(lbs): 450 Blood Pressure(mmHg): 184/128 Body Mass Index(BMI): 57.8 Temperature(F): 97.6 Respiratory Rate(breaths/min): 20 Photos: [36:No Photos] Right, Posterior Lower Leg Right, Posterior Lower Leg Right, Medial Lower Leg Wound Location: Gradually Appeared Gradually Appeared Gradually Appeared Wounding Event: Venous Leg Ulcer Venous Leg Ulcer Venous Leg Ulcer Primary Etiology: Lymphedema, Deep Vein Thrombosis, Lymphedema, Deep Vein Thrombosis, Lymphedema, Deep Vein Thrombosis, Comorbid History: Hypertension, Peripheral Venous Hypertension, Peripheral Venous Hypertension, Peripheral Venous Disease, Type II Diabetes Disease, Type II Diabetes Disease, Type II Diabetes 03/12/2021 03/12/2021 03/12/2021 Date Acquired: 2 2 2  Weeks of Treatment: Open Open Open Wound Status: No No No Wound Recurrence: No No Yes Clustered Wound: 5.6x3.8x0.2 5.6x3.8x0.2 3.7x2.1x0.2 Measurements L x W x D (cm) 16.713 16.713 6.103 Shawn (cm) : rea 3.343 3.343 1.221 Volume (cm) : -1.30% -1.30% 22.30% % Reduction in Area: 66.20% 66.20% 48.20% % Reduction in Volume: Full Thickness Without Exposed Full Thickness Without Exposed Full Thickness Without Exposed Classification: Support Structures Support Structures Support Structures Medium N/Shawn Medium Exudate Shawn mount: Serosanguineous N/Shawn Serosanguineous Exudate Type: red, brown N/Shawn red, brown Exudate Color: Distinct, outline attached N/Shawn Distinct, outline attached Wound Margin: Large (67-100%) N/Shawn Medium (34-66%) Granulation Amount: Red N/Shawn Red Granulation Quality: Small (1-33%) N/Shawn Medium (34-66%) Necrotic Amount: Eschar, Adherent Slough N/Shawn Adherent Slough Necrotic Tissue: Fat Layer (Subcutaneous Tissue): Yes N/Shawn Fat Layer (Subcutaneous Tissue): Yes Exposed Structures: Fascia: No Fascia: No Tendon:  No Tendon: No Muscle: No Muscle: No Joint: No Joint: No Bone: No Bone: No Small (1-33%) N/Shawn Small (1-33%) Epithelialization: Wound Number: [id number] N/Shawn Photos: N/Shawn Right, Anterior Lower Leg Right, Lateral Lower Leg N/Shawn Wound Location: Gradually Appeared Gradually Appeared N/Shawn Wounding Event: Venous Leg Ulcer Venous Leg Ulcer N/Shawn Primary Etiology: Lymphedema, Deep Vein Thrombosis, Lymphedema, Deep Vein Thrombosis, N/Shawn Comorbid History: Hypertension, Peripheral Venous Hypertension, Peripheral Venous Disease, Type II Diabetes Disease, Type II Diabetes 03/12/2021 03/12/2021 N/Shawn Date Acquired: 2 2 N/Shawn Weeks of Treatment: Open Open N/Shawn Wound Status: No No N/Shawn Wound Recurrence: No Yes N/Shawn Clustered Wound: 3x2.5x0.1 1.3x1x0.1 N/Shawn Measurements L x W x D (cm) 5.89 1.021 N/Shawn Shawn (cm) : rea 0.589 0.102 N/Shawn Volume (cm) : -520.00% 84.50% N/Shawn % Reduction in Area: -210.00% 92.30% N/Shawn % Reduction in Volume: Full Thickness Without Exposed Full Thickness Without Exposed N/Shawn Classification: Support Structures Support Structures Medium Medium N/Shawn Exudate Amount: Serosanguineous Serosanguineous N/Shawn Exudate Type: red, brown red, brown N/Shawn Exudate Color: Distinct, outline attached Distinct, outline attached N/Shawn Wound Margin: Small (1-33%) Large (67-100%) N/Shawn Granulation Amount: Red Red N/Shawn Granulation Quality: Large (67-100%) Small (1-33%) N/Shawn Necrotic Amount: Eschar, Adherent [facility] Adherent Slough N/Shawn Necrotic Tissue: Fat Layer (Subcutaneous Tissue): Yes Fat Layer (Subcutaneous Tissue): Yes N/Shawn Exposed Structures: Fascia: No Fascia: No Tendon: No Tendon: No Muscle: No Muscle: No Joint: No Joint: No Bone: No Bone: No Small (1-33%) Small (1-33%) N/Shawn Epithelialization: Treatment Notes Electronic Signature(s) Signed: 09/21/2021 4:59:49 PM By: Baltazar Najjarobson, Michael MD Signed: 09/21/2021 5:02:52 PM By: Samuella BruinHerrington, Taylor Entered By: Baltazar Najjarobson, Michael on 09/21/2021  09:30:36 -------------------------------------------------------------------------------- Multi-Disciplinary Care Plan Details Patient Name: Date of Service: Shawn GongLO [name], Shawn DRIA N T. 09/21/2021 8:30 Shawn M Medical Record Number: [id number] Patient Account Number: [id number] Date of Birth/Sex: Treating RN: [birth year] (41 y.o. Marlan PalauM) Herrington, Taylor Primary Care Abdinasir Spadafore: Georganna SkeansWilson, Amelia Other Clinician: Referring Fawna Cranmer: Treating Janeen Watson/Extender: Marcie Balobson, Michael Wilson, Amelia Weeks in Treatment: 2 Active Inactive Venous Leg Ulcer Nursing Diagnoses: Actual venous Insuffiency (use after diagnosis is  confirmed) Goals: Patient will maintain optimal edema control Date Initiated: 09/07/2021 Target Resolution Date: 10/05/2021 Goal Status: Active Interventions: Compression as ordered Treatment Activities: Therapeutic compression applied : 09/07/2021 Notes: Electronic Signature(s) Signed: 09/21/2021 5:02:52 PM By: Samuella Bruin Entered By: Samuella Bruin on 09/21/2021 09:01:44 -------------------------------------------------------------------------------- Pain Assessment Details Patient Name: Date of Service: [name], Shawn Oppenheim T. 09/21/2021 8:30 Shawn M Medical Record Number: [id number] Patient Account Number: [id number] Date of Birth/Sex: Treating RN: 02-26-1981 (41 y.o. Marlan Palau Primary Care Dellas Guard: Other Clinician: Georganna Skeans Referring Jeanluc Wegman: Treating Heyden Jaber/Extender: Marcie Bal in Treatment: 2 Active Problems Location of Pain Severity and Description of Pain Patient Has Paino No Site Locations Rate the pain. Current Pain Level: 0 Pain Management and Medication Current Pain Management: Electronic Signature(s) Signed: 09/21/2021 5:02:52 PM By: Samuella Bruin Entered By: Samuella Bruin on 09/21/2021 08:53:14 -------------------------------------------------------------------------------- Patient/Caregiver Education  Details Patient Name: Date of Service: [name] 7/14/2023andnbsp8:30 Shawn M Medical Record Number: [id number] Patient Account Number: [id number] Date of Birth/Gender: Treating RN: 12-11-1980 (41 y.o. Marlan Palau Primary Care Physician: Georganna Skeans Other Clinician: Referring Physician: Treating Physician/Extender: Marcie Bal in Treatment: 2 Education Assessment Education Provided To: Patient Education Topics Provided Wound/Skin Impairment: Methods: Explain/Verbal Responses: Reinforcements needed, State content correctly Electronic Signature(s) Signed: 09/21/2021 5:02:52 PM By: Samuella Bruin Entered By: Samuella Bruin on 09/21/2021 09:02:03 -------------------------------------------------------------------------------- Wound Assessment Details Patient Name: Date of Service: [name], Shawn Oppenheim T. 09/21/2021 8:30 Shawn M Medical Record Number: [id number] Patient Account Number: [id number] Date of Birth/Sex: Treating RN: April 13, 1980 (41 y.o. Marlan Palau Primary Care Donnita Farina: Georganna Skeans Other Clinician: Referring Adrianne Shackleton: Treating Emiliano Welshans/Extender: Marcie Bal in Treatment: 2 Wound Status Wound Number: [id number] Primary Venous Leg Ulcer Etiology: Wound Location: Right, Posterior Lower Leg Wound Open Wounding Event: Gradually Appeared Status: Date Acquired: 03/12/2021 Comorbid Lymphedema, Deep Vein Thrombosis, Hypertension, Peripheral Weeks Of Treatment: 2 History: Venous Disease, Type II Diabetes Clustered Wound: No Wound Measurements Length: (cm) 5.6 Width: (cm) 3.8 Depth: (cm) 0.2 Area: (cm) 16.713 Volume: (cm) 3.343 % Reduction in Area: -1.3% % Reduction in Volume: 66.2% Epithelialization: Small (1-33%) Tunneling: No Undermining: No Wound Description Classification: Full Thickness Without Exposed Support Structures Wound Margin: Distinct, outline attached Exudate Amount:  Medium Exudate Type: Serosanguineous Exudate Color: red, brown Foul Odor After Cleansing: No Slough/Fibrino Yes Wound Bed Granulation Amount: Large (67-100%) Exposed Structure Granulation Quality: Red Fascia Exposed: No Necrotic Amount: Small (1-33%) Fat Layer (Subcutaneous Tissue) Exposed: Yes Necrotic Quality: Eschar, Adherent Slough Tendon Exposed: No Muscle Exposed: No Joint Exposed: No Bone Exposed: No Electronic Signature(s) Signed: 09/21/2021 5:02:52 PM By: Samuella Bruin Entered By: Samuella Bruin on 09/21/2021 09:00:35 -------------------------------------------------------------------------------- Wound Assessment Details Patient Name: Date of Service: [name], Shawn Oppenheim T. 09/21/2021 8:30 Shawn M Medical Record Number: [id number] Patient Account Number: [id number] Date of Birth/Sex: Treating RN: Aug 05, 1980 (41 y.o. Marlan Palau Primary Care Sanchez Hemmer: Georganna Skeans Other Clinician: Referring Laelyn Blumenthal: Treating Deziree Mokry/Extender: Marcie Bal in Treatment: 2 Wound Status Wound Number: [id number] Primary Venous Leg Ulcer Etiology: Wound Location: Right, Posterior Lower Leg Wound Open Wounding Event: Gradually Appeared Status: Date Acquired: 03/12/2021 Comorbid Lymphedema, Deep Vein Thrombosis, Hypertension, Peripheral Weeks Of Treatment: 2 History: Venous Disease, Type II Diabetes Clustered Wound: No Photos Wound Measurements Length: (cm) 5.6 Width: (cm) 3.8 Depth: (cm) 0.2 Area: (cm) 16.713 Volume: (cm) 3.343 % Reduction in Area: -1.3% % Reduction in Volume: 66.2% Wound Description  Classification: Full Thickness Without Exposed Support Structur es Treatment Notes Wound #36 (Lower Leg) Wound Laterality: Right, Posterior Cleanser Normal Saline Discharge Instruction: Cleanse the wound with Normal Saline prior to applying Shawn clean dressing using gauze sponges, not tissue or cotton balls. Soap and Water Discharge Instruction:  May shower and wash wound with dial antibacterial soap and water prior to dressing change. Peri-Wound Care Topical Primary Dressing KerraCel Ag Gelling Fiber Dressing, 4x5 in (silver alginate) Discharge Instruction: Apply silver alginate to wound bed as instructed Secondary Dressing ABD Pad, 8x10 Discharge Instruction: Apply over primary dressing as directed. Woven Gauze Sponge, Non-Sterile 4x4 in Discharge Instruction: Apply over primary dressing as directed. Secured With Paper Tape, 2x10 (in/yd) Discharge Instruction: Secure dressing with tape as directed. Compression Wrap FourPress (4 layer compression wrap) Discharge Instruction: Apply four layer compression as directed. May also use Miliken CoFlex 2 layer compression system as alternative. Compression Stockings Add-Ons Electronic Signature(s) Signed: 09/21/2021 12:33:08 PM By: Redmond Pulling RN, BSN Signed: 09/21/2021 5:02:52 PM By: Samuella Bruin Entered By: Redmond Pulling on 09/21/2021 09:01:32 -------------------------------------------------------------------------------- Wound Assessment Details Patient Name: Date of Service: [name], Shawn DRIA N T. 09/21/2021 8:30 Shawn M Medical Record Number: [id number] Patient Account Number: [id number] Date of Birth/Sex: Treating RN: Feb 18, 1981 (41 y.o. Marlan Palau Primary Care Anju Sereno: Georganna Skeans Other Clinician: Referring Nolen Lindamood: Treating Shineka Auble/Extender: Marcie Bal in Treatment: 2 Wound Status Wound Number: [id number] Primary Venous Leg Ulcer Etiology: Wound Location: Right, Medial Lower Leg Wound Open Wounding Event: Gradually Appeared Status: Date Acquired: 03/12/2021 Comorbid Lymphedema, Deep Vein Thrombosis, Hypertension, Peripheral Weeks Of Treatment: 2 History: Venous Disease, Type II Diabetes Clustered Wound: Yes Photos Wound Measurements Length: (cm) 3.7 Width: (cm) 2.1 Depth: (cm) 0.2 Area: (cm) 6.103 Volume: (cm) 1.221 %  Reduction in Area: 22.3% % Reduction in Volume: 48.2% Epithelialization: Small (1-33%) Tunneling: No Undermining: No Wound Description Classification: Full Thickness Without Exposed Support Structures Wound Margin: Distinct, outline attached Exudate Amount: Medium Exudate Type: Serosanguineous Exudate Color: red, brown Foul Odor After Cleansing: No Slough/Fibrino Yes Wound Bed Granulation Amount: Medium (34-66%) Exposed Structure Granulation Quality: Red Fascia Exposed: No Necrotic Amount: Medium (34-66%) Fat Layer (Subcutaneous Tissue) Exposed: Yes Necrotic Quality: Adherent Slough Tendon Exposed: No Muscle Exposed: No Joint Exposed: No Bone Exposed: No Treatment Notes Wound #37 (Lower Leg) Wound Laterality: Right, Medial Cleanser Normal Saline Discharge Instruction: Cleanse the wound with Normal Saline prior to applying Shawn clean dressing using gauze sponges, not tissue or cotton balls. Soap and Water Discharge Instruction: May shower and wash wound with dial antibacterial soap and water prior to dressing change. Peri-Wound Care Topical Primary Dressing KerraCel Ag Gelling Fiber Dressing, 4x5 in (silver alginate) Discharge Instruction: Apply silver alginate to wound bed as instructed Secondary Dressing ABD Pad, 8x10 Discharge Instruction: Apply over primary dressing as directed. Woven Gauze Sponge, Non-Sterile 4x4 in Discharge Instruction: Apply over primary dressing as directed. Secured With Paper Tape, 2x10 (in/yd) Discharge Instruction: Secure dressing with tape as directed. Compression Wrap FourPress (4 layer compression wrap) Discharge Instruction: Apply four layer compression as directed. May also use Miliken CoFlex 2 layer compression system as alternative. Compression Stockings Add-Ons Electronic Signature(s) Signed: 09/21/2021 12:33:08 PM By: Redmond Pulling RN, BSN Signed: 09/21/2021 5:02:52 PM By: Samuella Bruin Entered By: Redmond Pulling on 09/21/2021  09:02:12 -------------------------------------------------------------------------------- Wound Assessment Details Patient Name: Date of Service: [name], Shawn DRIA N T. 09/21/2021 8:30 Shawn M Medical Record Number: [id number] Patient Account Number: [id number] Date of Birth/Sex: Treating RN:  Feb 22, 1981 (41 y.o. Marlan Palau Primary Care Deniyah Dillavou: Georganna Skeans Other Clinician: Referring Baxter Gonzalez: Treating Treshun Wold/Extender: Marcie Bal in Treatment: 2 Wound Status Wound Number: [id number] Primary Venous Leg Ulcer Etiology: Wound Location: Right, Anterior Lower Leg Wound Open Wounding Event: Gradually Appeared Status: Date Acquired: 03/12/2021 Comorbid Lymphedema, Deep Vein Thrombosis, Hypertension, Peripheral Weeks Of Treatment: 2 History: Venous Disease, Type II Diabetes Clustered Wound: No Photos Wound Measurements Length: (cm) 3 Width: (cm) 2.5 Depth: (cm) 0.1 Area: (cm) 5.89 Volume: (cm) 0.589 % Reduction in Area: -520% % Reduction in Volume: -210% Epithelialization: Small (1-33%) Tunneling: No Undermining: No Wound Description Classification: Full Thickness Without Exposed Support Structures Wound Margin: Distinct, outline attached Exudate Amount: Medium Exudate Type: Serosanguineous Exudate Color: red, brown Wound Bed Granulation Amount: Small (1-33%) Granulation Quality: Red Necrotic Amount: Large (67-100%) Necrotic Quality: Eschar, Adherent Slough Foul Odor After Cleansing: No Slough/Fibrino Yes Exposed Structure Fascia Exposed: No Fat Layer (Subcutaneous Tissue) Exposed: Yes Tendon Exposed: No Muscle Exposed: No Joint Exposed: No Bone Exposed: No Treatment Notes Wound #38 (Lower Leg) Wound Laterality: Right, Anterior Cleanser Normal Saline Discharge Instruction: Cleanse the wound with Normal Saline prior to applying Shawn clean dressing using gauze sponges, not tissue or cotton balls. Soap and Water Discharge Instruction: May  shower and wash wound with dial antibacterial soap and water prior to dressing change. Peri-Wound Care Topical Primary Dressing KerraCel Ag Gelling Fiber Dressing, 4x5 in (silver alginate) Discharge Instruction: Apply silver alginate to wound bed as instructed Secondary Dressing ABD Pad, 8x10 Discharge Instruction: Apply over primary dressing as directed. Woven Gauze Sponge, Non-Sterile 4x4 in Discharge Instruction: Apply over primary dressing as directed. Secured With Paper Tape, 2x10 (in/yd) Discharge Instruction: Secure dressing with tape as directed. Compression Wrap FourPress (4 layer compression wrap) Discharge Instruction: Apply four layer compression as directed. May also use Miliken CoFlex 2 layer compression system as alternative. Compression Stockings Add-Ons Electronic Signature(s) Signed: 09/21/2021 12:33:08 PM By: Redmond Pulling RN, BSN Signed: 09/21/2021 5:02:52 PM By: Samuella Bruin Entered By: Redmond Pulling on 09/21/2021 09:02:43 -------------------------------------------------------------------------------- Wound Assessment Details Patient Name: Date of Service: [name], Shawn DRIA N T. 09/21/2021 8:30 Shawn M Medical Record Number: [id number] Patient Account Number: [id number] Date of Birth/Sex: Treating RN: 22-Apr-1980 (41 y.o. Marlan Palau Primary Care Dimitrious Micciche: Georganna Skeans Other Clinician: Referring Shafter Jupin: Treating Khamiya Varin/Extender: Marcie Bal in Treatment: 2 Wound Status Wound Number: [id number] Primary Venous Leg Ulcer Etiology: Wound Location: Right, Lateral Lower Leg Wound Open Wounding Event: Gradually Appeared Status: Date Acquired: 03/12/2021 Comorbid Lymphedema, Deep Vein Thrombosis, Hypertension, Peripheral Weeks Of Treatment: 2 History: Venous Disease, Type II Diabetes Clustered Wound: Yes Photos Wound Measurements Length: (cm) 1.3 Width: (cm) 1 Depth: (cm) 0.1 Area: (cm) 1.021 Volume: (cm) 0.102 %  Reduction in Area: 84.5% % Reduction in Volume: 92.3% Epithelialization: Small (1-33%) Tunneling: No Undermining: No Wound Description Classification: Full Thickness Without Exposed Support Structures Wound Margin: Distinct, outline attached Exudate Amount: Medium Exudate Type: Serosanguineous Exudate Color: red, brown Foul Odor After Cleansing: No Slough/Fibrino Yes Wound Bed Granulation Amount: Large (67-100%) Exposed Structure Granulation Quality: Red Fascia Exposed: No Necrotic Amount: Small (1-33%) Fat Layer (Subcutaneous Tissue) Exposed: Yes Necrotic Quality: Adherent Slough Tendon Exposed: No Muscle Exposed: No Joint Exposed: No Bone Exposed: No Treatment Notes Wound #39 (Lower Leg) Wound Laterality: Right, Lateral Cleanser Normal Saline Discharge Instruction: Cleanse the wound with Normal Saline prior to applying Shawn clean dressing using gauze sponges, not tissue or cotton balls. Soap and  Water Discharge Instruction: May shower and wash wound with dial antibacterial soap and water prior to dressing change. Peri-Wound Care Topical Primary Dressing KerraCel Ag Gelling Fiber Dressing, 4x5 in (silver alginate) Discharge Instruction: Apply silver alginate to wound bed as instructed Secondary Dressing ABD Pad, 8x10 Discharge Instruction: Apply over primary dressing as directed. Woven Gauze Sponge, Non-Sterile 4x4 in Discharge Instruction: Apply over primary dressing as directed. Secured With Paper Tape, 2x10 (in/yd) Discharge Instruction: Secure dressing with tape as directed. Compression Wrap FourPress (4 layer compression wrap) Discharge Instruction: Apply four layer compression as directed. May also use Miliken CoFlex 2 layer compression system as alternative. Compression Stockings Add-Ons Electronic Signature(s) Signed: 09/21/2021 12:33:08 PM By: Redmond Pulling RN, BSN Signed: 09/21/2021 5:02:52 PM By: Samuella Bruin Entered By: Redmond Pulling on 09/21/2021  09:03:30 -------------------------------------------------------------------------------- Vitals Details Patient Name: Date of Service: [name], Shawn DRIA N T. 09/21/2021 8:30 Shawn M Medical Record Number: [id number] Patient Account Number: [id number] Date of Birth/Sex: Treating RN: 04-Nov-1980 (41 y.o. Marlan Palau Primary Care Maryjo Ragon: Georganna Skeans Other Clinician: Referring Kayo Zion: Treating Ameshia Pewitt/Extender: Marcie Bal in Treatment: 2 Vital Signs Time Taken: 08:52 Temperature (F): 97.6 Height (in): 74 Pulse (bpm): 76 Weight (lbs): 450 Respiratory Rate (breaths/min): 20 Body Mass Index (BMI): 57.8 Blood Pressure (mmHg): 184/128 Reference Range: 80 - 120 mg / dl Electronic Signature(s) Signed: 09/21/2021 5:02:52 PM By: Samuella Bruin Entered By: Samuella Bruin on 09/21/2021 08:52:51

## 2021-09-21 NOTE — Discharge Instructions (Addendum)
You were seen here for an allergic reaction Please shower often soon as you get home and remove any remaining topical allergen. You are given a prescription for prednisone which you should take for the next 5 days You are given an EpiPen.  If you have any return of allergic symptoms in the future with new allergens or return today, use your EpiPen, directly to hospital or call 911 Please return if you are having any worsening symptoms

## 2021-09-21 NOTE — ED Notes (Signed)
Respiratory called and made aware of pt and need ABG. Per EDP.

## 2021-09-21 NOTE — Progress Notes (Signed)
Gaines, Hani T ([id number]) . Visit Report for 09/21/2021 HPI Details Patient Name: Date of Service: [name]. 09/21/2021 8:30 A M Medical Record Number: [id number] Patient Account Number: [id number] Date of Birth/Sex: Treating RN: 03-24-1980 (41 y.o. [name] Primary Care Provider: Georganna Skeans Other Clinician: Referring Provider: Treating Provider/Extender: Marcie Bal in Treatment: 2 History of Present Illness HPI Description: The patient is a 41 yrs old bm here for evaluation of his right leg ulcer. He has an extensive history of lymphedema and ulcers. He is being treated at the [facility] by Dr. [name] with [name] boots and the [facility]. He has pumps at home but he is only using them once a day. 08/30/14 selective debridement done of surface eschar. The wound cleans up quite nicely in the bed of this looks healthy. He is using a wound VAC under an Unna wrap. 11/17/14; selective debridement done of surface eschar. Once again the wound beds at clean up quite nicely. He is no longer using a wound VAC. Recent dressing changes include Hydrofera Blue. Apparently he has done a wide variety of different topical dressings including advanced treatment options like Apligraf's without success. 03/21/15; surgical debridement done of surface eschar and nonviable subcutaneous tissue. The area on the medial aspect of the right leg has closed and the wound on the lateral right leg looks improved has been using Silver Collagen 04/11/15; I think attendance here is sporadic. The area on the right medial leg remains healed. He has a new tiny area on the back of the right leg. Again a surgical debridement of the surface eschar and nonviable subcutaneous tissue of the major wound on the right lateral leg. He has been using Silver Collagen and at home, he does his own Unna wraps at home edema control seems reasonable. 04/20/15; the area on the right medial leg has a very superficial  area which may not even be open nevertheless I felt needed to be dressed today [this is his original chronic wound] the new wound is on the right anterior lateral leg is underwent a surgical debridement. He has a small wound on the right posterior leg. He puts on his own Unna boots, according to our nurses he does this fairly well. We have been using Silver collagen. 05/02/2015 -- I understand in the past his venous studies have been done at [facility] and he was advised weight loss before they would attempt to tackle his iliac vein blockage. He has not gone back for review. 06/27/2015 -- we have applied for Apligraf and are awaiting his insurance clearance. 07/25/2015 -- he still has to use her back from his insurance agent regarding his copayment for his Apligraf. 07/31/2015 -- the patient got a reply from the insurance agent regarding the dollar payment for his application but is not sure whether it is for 5 or for one. 11/28/2015 -- has been hurting a little bit more and he has had change in color of his wound especially on the medial part. 12/05/2015 -- his culture was positive for Proteus mirabilis and K Oxytoca which are sensitive to ciprofloxacin which he is already on. 10/3/17still on ciprofloxacin. His mother reports of dressing had to be changed last Friday due to odor. He has not been systemically unwell 12/19/15; patient came in today complaining of increasing pain and tightness in his upper right thigh. He completed the ciprofloxacin 2 or 3 days ago. He is not running a fever today. 12/26/2015 -- last  week he had been seen by Dr. [name] who got a lower extremity venous duplex evaluation done which did not show DVT or SVT in the right lower extremity. he had also put him on Augmentin in addition to the previous ciprofloxacin and he had received for 2 weeks 01/02/2016 -- -- was admitted to the hospital on 12/26/2015 and discharged on 12/29/2015 and was treated for cellulitis. He was also  newly diagnosed with type 2 diabetes mellitus and outpatient monitoring and initiation of treatment was recommended. Patients hemoglobin A1c was 6.6 No osteomyelitis detected on x-rays and recent Doppler study was negative for DVT Oral doxycycline and Levaquin were recommended for the patient as an . outpatient for a 14 day treatment. IV Zosyn and vancomycin was given due to concerns of Pseudomonas infection while he was in hospital. 02/13/2016 -- he has not gone back to [facility] where he had his vascular opinion earlier and I have urged him to regroup with them to see if there is any surgical options available. 02/27/2016 -- has an appointment to see [facility] vascular surgeons on January 3 and may be seeing Dr. Verdie Drown 03/19/2016 -- I was not able to find any notes on Epic but the patient did go to the vascular clinic at [facility] and saw the PA to Dr. Verdie Drown. I understand she has recommended a CT scan and MRI to be done on February 1 and they would review this with him on the same day. 04/09/2016 -- was admitted to the hospital on 03/20/2016 acute respiratory failure with hypoxia, abdominal discomfort with erythema, hypertensive urgency and chronic wound of the right leg with morbid obesity. he was discharged home on 04/05/2016 and was to continue on IV antibiotics for 18 more days follow-up with the wound center and continue with his cardiologist. he has lost approximately 130 pounds and diuresed over 48 L. He was treated with IV Zosyn and ID recommended he continue this for 3 weeks more. The notes from [facility] wound clinic were noted where he was seen by the PA and she had taken cultures which grew MSSA and Pseudomonas. She had recommended edema control with a 4-layer compression and also referred him to the lymphedema clinic. A CT venogram was also planned for the future. 04/16/2016 -- at [facility] a CT pelvic venogram runoff was done on 04/11/2016 -- IMPRESSION:-Limited  study secondary to poor opacification of venous structures. No definite evidence of acute deep vein thrombosis. 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 -Extensive right iliac chain and right inguinal adenopathy, favored to be reactive/secondary to congestion. The patient continues on his IV antibiotics through his PICC line and has his cardiology opinion and also a bariatric surgery opinion coming up. 04/30/2016 -- He has completed his IV antibiotics through his PICC line. 05/14/2016 --he was seen by the PA, Ms Sluss, recommended alternate day dressing with compression and use Hibiclens and Dakin's solution with acetic acid on his wounds. 07/30/2016 -- he has still not got his custom-made compression stockings and I have urged him to go and get him self measured for these. 08/06/2016 -- he has been measured for his custom stocking and will get in 2 weeks. He has lost 20 pounds since March 08/27/2016 -- he has not got his custom stockings yet and he tried another pair which has not fit well and he has had a lot of maceration increase  the size of his wound. 09/03/2016 -- the patient says that he has not been very compliant with his dressing changes and his elevation and exercise and this week he is notices wound get rather large with maceration. 09/18/16; according to our intake nurse the measurements on this patient's wound are slightly larger. I note previous compliance concerns. I note that his wounds measured larger last week which was noted by Dr. Meyer Russel. Culture done last week was negative. 10/01/2016 -- the patient has received some lymphedema pumps which are new and he says he is being compliant with this. He is going to be away for 2 weeks on vacation to [facility] 10/15/2016 -- he returns after 2 weeks and tells me he has been diligent with his dressing and has lost 25 pounds over the last 3  months. 10/22/2016 -- his last hemoglobin A1c was 6.2 and he is now diet controlled. 11/19/2016 -- he has been having problems with his compression stockings which are custom made at [facility]. He has not yet been able to get them. He had taken out his compression because he had gone there and had no dressing on when he came here today READMISSION 05/22/2018 [name] is a now 41 year old man with a Linsey history of wounds in his lower extremities secondary to chronic venous insufficiency with secondary lymphedema. He has had previous vein ligations on the right although I do not have this information in front of me. As I understand things he also has central venous obstruction with a vein bypass in 2005 I believe this was done at [facility]. He tells me over the last 2 months he has noted mid reopening in the right mid anterior lower extremity. This is a rectangular shaped wound which is kind of odd in terms of how that formed. He has been using silver alginate and Unna boots that we used on him when he was here in 2018. He buys his product supply online thinks this is cheaper than ordering through intermediary's Past medical history; CHF, morbid obesity, hypertension, hyperlipidemia, chronic venous insufficiency, vein bypass in 2005 previous vein ablations or ligations. He has a stent in the right lower extremity. ABI in this clinic was 1.04. He is using his compression pumps at home 3/27; 2-week follow-up. Right lower extremity wounds related to chronic venous insufficiency and lymphedema. He has been using silver alginate under an Radio broadcast assistant. He change the [employer] himself at home last week. He is making nice progress 4/10; 2-week follow-up. Right lower extremity wound is just about closed a small open area in the middle of the and large rectangular wound he had at the beginning. His edema control is excellent. He says he is using his compression pumps religiously 4/17; 2-week follow-up. Right  lower extremity wound is totally closed. He had a large rectangular wound which is progressively closed. His edema control is very good. He is using his compression pumps once to twice a day READMISSION 09/19/2020 [name] is now a 41 year old man we have had for several stays in this clinic including 2017, 2018 and most recently in 2020 with a wound on his right lower leg. He has compression pumps which he claims to be using twice a day. He also has stockings however I am not sure how much he uses them. Things have broken down over the last month or 2 he has an extensive wound area on the right mid tibial area I think this is similar to what he has had  in the past. He has been using Xeroform and an Ace wrap that his girlfriend is applying. Past medical history; the patient has known chronic venous insufficiency with secondary lymphedema. He had an iliac vein blockage and he had a bypass at [facility] although the patient is not followed up with them. The surgeon may have been Dr. Jacolyn Reedy. As noted he has lymphedema pumps. Type 2 diabetes congestive heart failure obstructive sleep apnea. He has a history of a right leg DVT although this may have been the iliac vein he is not currently on anticoagulation ABI in our clinic is 1.37 on the right Imaging Results - in this encounter CT Pelvic Venogram Run Off Imaging Results - CT Pelvic Venogram Run Off Impressions Performed At -Limited study secondary to poor opacification of venous structures. Nodefinite evidence of acute deep vein thrombosis. 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 -Extensive right iliac chain and right inguinal adenopathy, favored to bereactive/secondary to congestion. [facility] Imaging Results - CT Pelvic Venogram Run Off Narrative Performed At EXAM: CT PELVIC VENOGRAM RUN OFF DATE: 04/11/2016 1:28 PM ACCESSION: [id number] UN DICTATED:  04/11/2016 2:18 PM INTERPRETATION LOCATION: [facility] CLINICAL INDICATION: 41 years old Male with h/o RLE DVT and massive lymphedema of both LE's. Eval for venous outflow obstruction vs extrinsic mass- L97.203-Skin ulcer of calf with necrosis of muscle, unspecified laterality(RAF-HCC) COMPARISON: CT abdomen pelvis dated 02/11/2007 TECHNIQUE: A spiral CT scan was obtained approximately 3 minutes after administration of IV contrast from the kidneys to the knees.Images were reconstructed in the axial plane. Multiplanar reformatted and MIP images were provided for further evaluation of the vessels. For selected cases, 3D volumerendered images are also provided. VASCULAR FINDINGS: There is overall poor contrast opacification of the venous structures, limitingevaluation. IVC: No evidence of thrombus. Iliac veins: The right iliac veins are chronically occluded/diminutive in size. There are extensive venous collaterals noted throughout the right pelvis extending into the right lower extremity. The number and caliber of the venous collaterals have increased when compared to 02/11/2007. The left iliac veins appear patent. There are also small caliber venous collaterals within the left pelvis extending into the left lower extremity. Anterior abdominal wall venouscollaterals are also noted but fully imaged secondary to patient diameter. The bilateral femoral and popliteal veins appear patent. There is no evidence of acute thrombus. The arterial vasculature is unremarkable on this venous phase time study. NONVASCULAR FINDINGS: LOWER CHEST Unremarkable. : ABDOMEN: HEPATOBILIARY: Unremarkable liver. No biliary ductal dilatation. Gallbladder isremarkable. PANCREAS: Unremarkable. SPLEEN: Unremarkable. ADRENAL GLANDS: Unremarkable. KIDNEYS/URETERS: Unremarkable. BLADDER: Unremarkable. BOWEL/PERITONEUM/RETROPERITONEUM: No bowel obstruction. No acute inflammatoryprocess. No ascites. LYMPH NODES: Extensive  right iliac chain and right inguinal adenopathy. A nodal conglomerate in the right inguinal region measures 6.8 x 3.4 cm (2:124). This is only slightly larger when compared to 02/11/2007. Given interval stabilityover 10 years, these are favored to be reactive/secondary to congestion. REPRODUCTIVE: Unremarkable. BONES/SOFT TISSUES: No acute osseous abnormalities. Postsurgical changes in the right inguinal and left medial thigh region. Right greater than left softtissue edema throughout the lower extremities. [facility] Imaging Results - CT Pelvic Venogram Run Off Procedure Note Interface, Rad Results In - 04/11/2016 3:28 PM EST EXAM: CT PELVIC VENOGRAM RUN OFF DATE: 04/11/2016 1:28 PM ACCESSION: [id number] UN DICTATED: 04/11/2016 2:18 PM INTERPRETATION LOCATION: [facility] CLINICAL INDICATION: 41 years old Male with h/o RLE DVT and massive lymphedema of both LE's. Eval for venous outflow obstruction vs extrinsic mass- L97.203-Skin ulcer  of calf with necrosis of muscle, unspecified laterality (RAF-HCC) COMPARISON: CT abdomen pelvis dated 02/11/2007 TECHNIQUE: A spiral CT scan was obtained approximately 3 minutes after administration of IV contrast from the kidneys to the knees. Images were reconstructed in the axial plane. Multiplanar reformatted and MIP images were provided for further evaluation of the vessels. For selected cases, 3D volume rendered images are also provided. VASCULAR FINDINGS: There is overall poor contrast opacification of the venous structures, limiting evaluation. IVC: No evidence of thrombus. Iliac veins: The right iliac veins are chronically occluded/diminutive in size. There are extensive venous collaterals noted throughout the right pelvis extending into the right lower extremity. The number and caliber of the venous collaterals have increased when compared to 02/11/2007. The left iliac veins appear patent. There are also small caliber venous collaterals within the left pelvis  extending into the left lower extremity. Anterior abdominal wall venous collaterals are also noted but fully imaged secondary to patient diameter. The bilateral femoral and popliteal veins appear patent. There is no evidence of acute thrombus. The arterial vasculature is unremarkable on this venous phase time study. NONVASCULAR FINDINGS: LOWER CHEST Unremarkable. : ABDOMEN: HEPATOBILIARY: Unremarkable liver. No biliary ductal dilatation. Gallbladder is remarkable. PANCREAS: Unremarkable. SPLEEN: Unremarkable. ADRENAL GLANDS: Unremarkable. KIDNEYS/URETERS: Unremarkable. BLADDER: Unremarkable. BOWEL/PERITONEUM/RETROPERITONEUM: No bowel obstruction. No acute inflammatory process. No ascites. LYMPH NODES: Extensive right iliac chain and right inguinal adenopathy. A nodal conglomerate in the right inguinal region measures 6.8 x 3.4 cm (2:124). This is only slightly larger when compared to 02/11/2007. Given interval stability over 10 years, these are favored to be reactive/secondary to congestion. REPRODUCTIVE: Unremarkable. BONES/SOFT TISSUES: No acute osseous abnormalities. Postsurgical changes in the right inguinal and left medial thigh region. Right greater than left soft tissue edema throughout the lower extremities. IMPRESSION: -Limited study secondary to poor opacification of venous structures. No definite evidence of acute deep vein thrombosis. 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 -Extensive right iliac chain and right inguinal adenopathy, favored to be reactive/secondary to congestion. Imaging Results - CT Pelvic Venogram Run Off Performing Organization Address City/State/Zipcode Phone Number [facility] [address] [place], [place] [address] Surgical summary as listed below; Assessment: [name] is a 41 y.o. male with Pett history of right lower extremity chronic obstructive deep  venous disease with recurrent ulceration, absence of right iliac vein, prior left to right femoral vein bypass. Patient continues with recurrent ulceration, his weight precludes him from further vascular studies at this time. If he is able to lose 50-100 pounds we would be able to do left lower extremity venogram possible intervention with 50% chance of re- opening occlusion per Dr. Jacolyn Reedy. For global health, weight loss, potential improvement in his chronic venous insufficiency would appreciateinput from bariatric surgery group for recommendations. 09/26/20; patient comes in today with not much improvement. He has also some odor. 3 open areas and this linear area in his mid calf require debridement. We have been using silver alginate under compression. I gave him Keflex last week for what I thought was cellulitis in the right medial thigh he is completing this and I think this is better. 7/26; patient comes in today with again necrotic debris over these wounds. According to our intake nurse extreme malodor and drainage. We have been using silver alginate under compression. He is complaining about pain in the wound area. 7/29; Patient presents for follow-up. He reports improvement to the wound. He  has finished taking Keflex. He has no issues or complaints today. 10/17/2020 upon evaluation today patient appears to be doing poorly in regard to his leg ulcer. He has been tolerating the dressing changes without complication. Fortunately there does not appear to be any signs of active infection at this time. No fevers, chills, nausea, vomiting, or diarrhea. 8/23; the patient has 2 areas anteriorly and a larger area medially on the right lower leg. He comes in today with his compression wrap slipping down. We do not have good edema control. We have been using silver alginate. I believe he completed a course of antibioticso Levaquin given to him 2 weeks ago at his last visit. He states his girlfriend who is a  nurse changes his dressings. We have not seen this in 2 weeks. He just received a compounded topical antibiotic based on I believe a deep tissue culture that was done. I do not have a result of the deep tissue culture today and he forgot the compounded antibiotic. I will try to clarify this next time I tried to send him back to vascular at [facility] for review of his central venous obstruction although they would not even see him I think because of noncompliance, or at least perceived noncompliance with regards to the recommendations for weight loss, consideration of bariatric assessment for surgery etc. 8/30; although the patient's wounds anteriorly on the right lower leg extending medially were debrided today. Better looking wound surface he has better edema control still under 4-layer compression. He tells me he is using his compression pump twice a day however they are greater than 47 years old and he might benefit from a new set. He is using his Jodie Echevaria compounded antibiotic Since the last time he was here he saw Dr. Durwin Nora at vein and vascular at our request. He was felt to have adequate arterial flow for healing although there was still the issue about whether he might need an angiogram if things still. 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 He did not talk about the s previous central venous procedures that have been done at [facility]. That information is available in care everywhere however based on the duplex exam he was not felt to be a candidate for any venous procedures on the right. He has no significant venous reflux on the right and has had previously had a right greater saphenous vein ablated 9/6; the patient has a cluster of small areas over the right anterior mid tibia as well as an area medially. That the air 1 medially is more confluent all of them look like they have a better surface. We have been putting him in 4-layer compression and using his compression pumps twice  a day 9/13; both of the patient's wound areas appear somewhat better especially the large area medially. 9/27; 2-week follow-up. His wife who is a Designer, jewellery has been changing his 4-layer compression with Hydrofera Blue and [place] I think the measurements of the 2 remaining areas 1 on the right anterior tibia and 1 on the right lateral are better. In another setting I might consider advanced treatment product here. He is using his compression pumps 10/11; patient presents for follow-up. He has no issues or complaints today. His girlfriend continues to change his compression wrap with Hydrofera Blue and [place]. He denies signs of infection. 10/25; patient presents for follow-up. He reports a new wound to the right lateral aspect. He states the area was itching and he  scratched developing a wound. He denies signs of infection. 11/11; patient presents for follow-up. He no longer has a wound to the right lateral aspect. He had to use an Radio broadcast assistant for the past week due to short supply. He has no issues or complaints today. He denies signs of infection. READMISSION 09/07/2021: The patient is back with new wounds on his right lower extremity. He says that they first emerged around the first of the year. He was seen in the emergency department on June 22 for angioedema either secondary to his ARB or a new lotion that he tried. He was given prednisone. At the same time, the provider in the ER ordered doxycycline to cover his leg ulcer and applied a Unna boot. He did not have an Radio broadcast assistant on in clinic today but his significant other and he have been applying Neosporin with Kerlix and Coban wrapping. He denies any fevers or chills. No pain. No significant odor. All of the wounds have the fat layer exposed and there is slough buildup on all open surfaces. 09/14/2021: All of the wounds are little bit smaller today. They still have slough accumulation on the surface. We have been using silver alginate with  4-layer compression. A culture taken last week demonstrated a polymicrobial population, chiefly Escherichia coli. He had already been prescribed doxycycline in the ER and this was considered to be second line treatment per the PCR culture pharmacist. I did not prescribe any new antibiotics but we did place an order for [facility] topical compounded antibiotic therapy. 7/14; I think his wounds are about the same 3 large areas on the right medial lower leg and a more superficial area on the right mid lower leg. He has his [facility] antibiotic which includes gentamicin, vancomycin and ciprofloxacin. He will start this today. He also is just had his compression pumps repaired and he is now prepared to use them I have asked him to do this twice a day. Electronic Signature(s) Signed: 09/21/2021 4:59:49 PM By: Baltazar Najjar MD Entered By: Baltazar Najjar on 09/21/2021 09:32:30 -------------------------------------------------------------------------------- Physical Exam Details Patient Name: Date of Service: [name], [name] 09/21/2021 8:30 A M Medical Record Number: [id number] Patient Account Number: [id number] Date of Birth/Sex: Treating RN: November 21, 1980 (41 y.o. [name] Primary Care Provider: Georganna Skeans Other Clinician: Referring Provider: Treating Provider/Extender: Marcie Bal in Treatment: 2 Constitutional Patient is hypertensive.. Pulse regular and within target range for patient.[name] Respirations regular, non-labored and within target range.. Temperature is normal and within the target range for the patient.[name] Appears in no distress. Notes Wound exam; wounds are about the same. They appear clean and noninfected. However the edema control in his legs is not that good. No evidence of surrounding infection Electronic Signature(s) Signed: 09/21/2021 4:59:49 PM By: Baltazar Najjar MD Entered By: Baltazar Najjar on 09/21/2021  09:33:35 -------------------------------------------------------------------------------- Physician Orders Details Patient Name: Date of Service: [name], [name] 09/21/2021 8:30 A M Medical Record Number: [id number] Patient Account Number: [id number] Date of Birth/Sex: Treating RN: 06/10/1980 (41 y.o. [name] Primary Care Provider: Georganna Skeans Other Clinician: Referring Provider: Treating Provider/Extender: Marcie Bal in Treatment: 2 Verbal / Phone Orders: No Diagnosis Coding Follow-up Appointments Other: - Pt requesting to hold off on scheduling another appointment until he can get his insurance worked out [employer] May shower with protection but do not get wound dressing(s) wet. - May use a cast wrap to put over wraps  to shower; you can buy these at [facility] or [facility] Edema Control - Lymphedema / SCD / Other Lymphedema Pumps. Use Lymphedema pumps on leg(s) 2-3 times a day for 45-60 minutes. If wearing any wraps or hose, do not remove them. Continue exercising as instructed. Elevate legs to the level of the heart or above for 30 minutes daily and/or when sitting, a frequency of: - throughout the day Avoid standing for Pollick periods of time. Patient to wear own compression stockings every day. - on Left Leg Wound Treatment Wound #36 - Lower Leg Wound Laterality: Right, Posterior Cleanser: Normal Saline 1 x Per Week/30 Days Discharge Instructions: Cleanse the wound with Normal Saline prior to applying a clean dressing using gauze sponges, not tissue or cotton balls. Cleanser: Soap and Water 1 x Per Week/30 Days Discharge Instructions: May shower and wash wound with dial antibacterial soap and water prior to dressing change. Prim Dressing: KerraCel Ag Gelling Fiber Dressing, 4x5 in (silver alginate) 1 x Per Week/30 Days ary Discharge Instructions: Apply silver alginate to wound bed as instructed Secondary Dressing: ABD Pad, 8x10  1 x Per Week/30 Days Discharge Instructions: Apply over primary dressing as directed. Secondary Dressing: Woven Gauze Sponge, Non-Sterile 4x4 in 1 x Per Week/30 Days Discharge Instructions: Apply over primary dressing as directed. Secured With: Paper Tape, 2x10 (in/yd) 1 x Per Week/30 Days Discharge Instructions: Secure dressing with tape as directed. Compression Wrap: FourPress (4 layer compression wrap) 1 x Per Week/30 Days Discharge Instructions: Apply four layer compression as directed. May also use Miliken CoFlex 2 layer compression system as alternative. Wound #37 - Lower Leg Wound Laterality: Right, Medial Cleanser: Normal Saline 1 x Per Week/30 Days Discharge Instructions: Cleanse the wound with Normal Saline prior to applying a clean dressing using gauze sponges, not tissue or cotton balls. Cleanser: Soap and Water 1 x Per Week/30 Days Discharge Instructions: May shower and wash wound with dial antibacterial soap and water prior to dressing change. Prim Dressing: KerraCel Ag Gelling Fiber Dressing, 4x5 in (silver alginate) 1 x Per Week/30 Days ary Discharge Instructions: Apply silver alginate to wound bed as instructed Secondary Dressing: ABD Pad, 8x10 1 x Per Week/30 Days Discharge Instructions: Apply over primary dressing as directed. Secondary Dressing: Woven Gauze Sponge, Non-Sterile 4x4 in 1 x Per Week/30 Days Discharge Instructions: Apply over primary dressing as directed. Secured With: Paper Tape, 2x10 (in/yd) 1 x Per Week/30 Days Discharge Instructions: Secure dressing with tape as directed. Compression Wrap: FourPress (4 layer compression wrap) 1 x Per Week/30 Days Discharge Instructions: Apply four layer compression as directed. May also use Miliken CoFlex 2 layer compression system as alternative. Wound #38 - Lower Leg Wound Laterality: Right, Anterior Cleanser: Normal Saline 1 x Per Week/30 Days Discharge Instructions: Cleanse the wound with Normal Saline prior to  applying a clean dressing using gauze sponges, not tissue or cotton balls. Cleanser: Soap and Water 1 x Per Week/30 Days Discharge Instructions: May shower and wash wound with dial antibacterial soap and water prior to dressing change. Prim Dressing: KerraCel Ag Gelling Fiber Dressing, 4x5 in (silver alginate) 1 x Per Week/30 Days ary Discharge Instructions: Apply silver alginate to wound bed as instructed Secondary Dressing: ABD Pad, 8x10 1 x Per Week/30 Days Discharge Instructions: Apply over primary dressing as directed. Secondary Dressing: Woven Gauze Sponge, Non-Sterile 4x4 in 1 x Per Week/30 Days Discharge Instructions: Apply over primary dressing as directed. Secured With: Paper Tape, 2x10 (in/yd) 1 x Per Week/30 Days Discharge Instructions: Secure  dressing with tape as directed. Compression Wrap: FourPress (4 layer compression wrap) 1 x Per Week/30 Days Discharge Instructions: Apply four layer compression as directed. May also use Miliken CoFlex 2 layer compression system as alternative. Wound #39 - Lower Leg Wound Laterality: Right, Lateral Cleanser: Normal Saline 1 x Per Week/30 Days Discharge Instructions: Cleanse the wound with Normal Saline prior to applying a clean dressing using gauze sponges, not tissue or cotton balls. Cleanser: Soap and Water 1 x Per Week/30 Days Discharge Instructions: May shower and wash wound with dial antibacterial soap and water prior to dressing change. Prim Dressing: KerraCel Ag Gelling Fiber Dressing, 4x5 in (silver alginate) 1 x Per Week/30 Days ary Discharge Instructions: Apply silver alginate to wound bed as instructed Secondary Dressing: ABD Pad, 8x10 1 x Per Week/30 Days Discharge Instructions: Apply over primary dressing as directed. Secondary Dressing: Woven Gauze Sponge, Non-Sterile 4x4 in 1 x Per Week/30 Days Discharge Instructions: Apply over primary dressing as directed. Secured With: Paper Tape, 2x10 (in/yd) 1 x Per Week/30  Days Discharge Instructions: Secure dressing with tape as directed. Compression Wrap: FourPress (4 layer compression wrap) 1 x Per Week/30 Days Discharge Instructions: Apply four layer compression as directed. May also use Miliken CoFlex 2 layer compression system as alternative. Electronic Signature(s) Signed: 09/21/2021 4:59:49 PM By: Baltazar Najjar MD Signed: 09/21/2021 5:02:52 PM By: Samuella Bruin Entered By: Samuella Bruin on 09/21/2021 09:29:21 -------------------------------------------------------------------------------- Problem List Details Patient Name: Date of Service: [name], [name] 09/21/2021 8:30 A M Medical Record Number: [id number] Patient Account Number: [id number] Date of Birth/Sex: Treating RN: 07-11-1980 (41 y.o. [name] Primary Care Provider: Georganna Skeans Other Clinician: Referring Provider: Treating Provider/Extender: Marcie Bal in Treatment: 2 Active Problems ICD-10 Encounter Code Description Active Date MDM Diagnosis [phone] Non-pressure chronic ulcer of other part of right lower leg with fat layer 09/07/2021 No Yes exposed I50.9 Heart failure, unspecified 09/07/2021 No Yes I10 Essential (primary) hypertension 09/07/2021 No Yes E11.622 Type 2 diabetes mellitus with other skin ulcer 09/07/2021 No Yes E66.01 Morbid (severe) obesity due to excess calories 09/07/2021 No Yes I89.0 Lymphedema, not elsewhere classified 09/07/2021 No Yes Inactive Problems Resolved Problems Electronic Signature(s) Signed: 09/21/2021 4:59:49 PM By: Baltazar Najjar MD Entered By: Baltazar Najjar on 09/21/2021 09:30:25 -------------------------------------------------------------------------------- Progress Note Details Patient Name: Date of Service: [name], [name] 09/21/2021 8:30 A M Medical Record Number: [id number] Patient Account Number: [id number] Date of Birth/Sex: Treating RN: June 07, 1980 (41 y.o. [name] Primary Care Provider: Georganna Skeans Other Clinician: Referring Provider: Treating Provider/Extender: Marcie Bal in Treatment: 2 Subjective History of Present Illness (HPI) The patient is a 41 yrs old bm here for evaluation of his right leg ulcer. He has an extensive history of lymphedema and ulcers. He is being treated at the [facility] by Dr. [name] with [name] boots and the [facility]. He has pumps at home but he is only using them once a day. 08/30/14 selective debridement done of surface eschar. The wound cleans up quite nicely in the bed of this looks healthy. He is using a wound VAC under an Unna wrap. 11/17/14; selective debridement done of surface eschar. Once again the wound beds at clean up quite nicely. He is no longer using a wound VAC. Recent dressing changes include Hydrofera Blue. Apparently he has done a wide variety of different topical dressings including advanced treatment options like Apligraf's without success. 03/21/15; surgical debridement done of surface eschar and  nonviable subcutaneous tissue. The area on the medial aspect of the right leg has closed and the wound on the lateral right leg looks improved has been using Silver Collagen 04/11/15; I think attendance here is sporadic. The area on the right medial leg remains healed. He has a new tiny area on the back of the right leg. Again a surgical debridement of the surface eschar and nonviable subcutaneous tissue of the major wound on the right lateral leg. He has been using Silver Collagen and at home, he does his own Unna wraps at home edema control seems reasonable. 04/20/15; the area on the right medial leg has a very superficial area which may not even be open nevertheless I felt needed to be dressed today [this is his original chronic wound] the new wound is on the right anterior lateral leg is underwent a surgical debridement. He has a small wound on the right posterior leg. He puts on his own Unna  boots, according to our nurses he does this fairly well. We have been using Silver collagen. 05/02/2015 -- I understand in the past his venous studies have been done at [facility] and he was advised weight loss before they would attempt to tackle his iliac vein blockage. He has not gone back for review. 06/27/2015 -- we have applied for Apligraf and are awaiting his insurance clearance. 07/25/2015 -- he still has to use her back from his insurance agent regarding his copayment for his Apligraf. 07/31/2015 -- the patient got a reply from the insurance agent regarding the dollar payment for his application but is not sure whether it is for 5 or for one. 11/28/2015 -- has been hurting a little bit more and he has had change in color of his wound especially on the medial part. 12/05/2015 -- his culture was positive for Proteus mirabilis and K Oxytoca which are sensitive to ciprofloxacin which he is already on. 12/12/15oostill on ciprofloxacin. His mother reports of dressing had to be changed last Friday due to odor. He has not been systemically unwell 12/19/15; patient came in today complaining of increasing pain and tightness in his upper right thigh. He completed the ciprofloxacin 2 or 3 days ago. He is not running a fever today. 12/26/2015 -- last week he had been seen by Dr. [name] who got a lower extremity venous duplex evaluation done which did not show DVT or SVT in the right lower extremity. he had also put him on Augmentin in addition to the previous ciprofloxacin and he had received for 2 weeks 01/02/2016 -- -- was admitted to the hospital on 12/26/2015 and discharged on 12/29/2015 and was treated for cellulitis. He was also newly diagnosed with type 2 diabetes mellitus and outpatient monitoring and initiation of treatment was recommended. Patientoos hemoglobin A1c was 6.6 No osteomyelitis detected on x-rays and recent Doppler study was negative for DVT Oral doxycycline and Levaquin were  recommended for the patient as an . outpatient for a 14 day treatment. IV Zosyn and vancomycin was given due to concerns of Pseudomonas infection while he was in hospital. 02/13/2016 -- he has not gone back to [facility] where he had his vascular opinion earlier and I have urged him to regroup with them to see if there is any surgical options available. 02/27/2016 -- has an appointment to see [facility] vascular surgeons on January 3 and may be seeing Dr. Verdie Drown 03/19/2016 -- I was not able to find any notes on Epic but the  patient did go to the vascular clinic at [facility] and saw the PA to Dr. Verdie Drown. I understand she has recommended a CT scan and MRI to be done on February 1 and they would review this with him on the same day. 04/09/2016 -- was admitted to the hospital on 03/20/2016 acute respiratory failure with hypoxia, abdominal discomfort with erythema, hypertensive urgency and chronic wound of the right leg with morbid obesity. he was discharged home on 04/05/2016 and was to continue on IV antibiotics for 18 more days follow-up with the wound center and continue with his cardiologist. he has lost approximately 130 pounds and diuresed over 48 L. He was treated with IV Zosyn and ID recommended he continue this for 3 weeks more. The notes from [facility] wound clinic were noted where he was seen by the PA and she had taken cultures which grew MSSA and Pseudomonas. She had recommended edema control with a 4-layer compression and also referred him to the lymphedema clinic. A CT venogram was also planned for the future. 04/16/2016 -- at [facility] a CT pelvic venogram runoff was done on 04/11/2016 -- IMPRESSION:-Limited study secondary to poor opacification of venous structures. No definite evidence of acute deep vein thrombosis. 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 -Extensive  right iliac chain and right inguinal adenopathy, favored to be reactive/secondary to congestion. The patient continues on his IV antibiotics through his PICC line and has his cardiology opinion and also a bariatric surgery opinion coming up. 04/30/2016 -- He has completed his IV antibiotics through his PICC line. 05/14/2016 --he was seen by the PA, Ms Sluss, recommended alternate day dressing with compression and use Hibiclens and Dakin's solution with acetic acid on his wounds. 07/30/2016 -- he has still not got his custom-made compression stockings and I have urged him to go and get him self measured for these. 08/06/2016 -- he has been measured for his custom stocking and will get in 2 weeks. He has lost 20 pounds since March 08/27/2016 -- he has not got his custom stockings yet and he tried another pair which has not fit well and he has had a lot of maceration increase the size of his wound. 09/03/2016 -- the patient says that he has not been very compliant with his dressing changes and his elevation and exercise and this week he is notices wound get rather large with maceration. 09/18/16; according to our intake nurse the measurements on this patient's wound are slightly larger. I note previous compliance concerns. I note that his wounds measured larger last week which was noted by Dr. Meyer Russel. Culture done last week was negative. 10/01/2016 -- the patient has received some lymphedema pumps which are new and he says he is being compliant with this. He is going to be away for 2 weeks on vacation to [facility] 10/15/2016 -- he returns after 2 weeks and tells me he has been diligent with his dressing and has lost 25 pounds over the last 3 months. 10/22/2016 -- his last hemoglobin A1c was 6.2 and he is now diet controlled. 11/19/2016 -- he has been having problems with his compression stockings which are custom made at [facility]. He has not yet been able to get them. He had taken out his  compression because he had gone there and had no dressing on when he came here today READMISSION 05/22/2018 [name] is a now 41 year old man with  a Voisin history of wounds in his lower extremities secondary to chronic venous insufficiency with secondary lymphedema. He has had previous vein ligations on the right although I do not have this information in front of me. 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 He tells me over the last 2 months he has noted mid reopening in the right mid anterior lower extremity. This is a rectangular shaped wound which is kind of odd in terms of how that formed. He has been using silver alginate and Unna boots that we used on him when he was here in 2018. He buys his product supply online thinks this is cheaper than ordering through intermediary's Past medical history; CHF, morbid obesity, hypertension, hyperlipidemia, chronic venous insufficiency, vein bypass in 2005 previous vein ablations or ligations. He has a stent in the right lower extremity. ABI in this clinic was 1.04. He is using his compression pumps at home 3/27; 2-week follow-up. Right lower extremity wounds related to chronic venous insufficiency and lymphedema. He has been using silver alginate under an Radio broadcast assistant. He change the [employer] himself at home last week. He is making nice progress 4/10; 2-week follow-up. Right lower extremity wound is just about closed a small open area in the middle of the and large rectangular wound he had at the beginning. His edema control is excellent. He says he is using his compression pumps religiously 4/17; 2-week follow-up. Right lower extremity wound is totally closed. He had a large rectangular wound which is progressively closed. His edema control is very good. He is using his compression pumps once to twice a day READMISSION 09/19/2020 [name] is now a 41 year old man we have had for  several stays in this clinic including 2017, 2018 and most recently in 2020 with a wound on his right lower leg. He has compression pumps which he claims to be using twice a day. He also has stockings however I am not sure how much he uses them. Things have broken down over the last month or 2 he has an extensive wound area on the right mid tibial area I think this is similar to what he has had in the past. He has been using Xeroform and an Ace wrap that his girlfriend is applying. Past medical history; the patient has known chronic venous insufficiency with secondary lymphedema. He had an iliac vein blockage and he had a bypass at [facility] although the patient is not followed up with them. The surgeon may have been Dr. Jacolyn Reedy. As noted he has lymphedema pumps. Type 2 diabetes congestive heart failure obstructive sleep apnea. He has a history of a right leg DVT although this may have been the iliac vein he is not currently on anticoagulation ABI in our clinic is 1.37 on the right Imaging Results - in this encounter CT Pelvic Venogram Run Off Imaging Results - CT Pelvic Venogram Run Off Impressions Performed At -Limited study secondary to poor opacification of venous structures. Nodefinite evidence of acute deep vein thrombosis. 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 -Extensive right iliac chain and right inguinal adenopathy, favored to bereactive/secondary to congestion. [facility] RAD Imaging Results - CT Pelvic Venogram Run Off Narrative Performed At EXAM: CT PELVIC VENOGRAM RUN OFF DATE: 04/11/2016 1:28 PM ACCESSION: [id number] UN DICTATED: 04/11/2016 2:18 PM INTERPRETATION LOCATION: [facility] CLINICAL INDICATION: 41 years  old Male with h/o RLE DVT and massive lymphedema of both LE's. Eval for venous outflow obstruction vs extrinsic mass- L97.203-Skin ulcer of calf with necrosis of muscle,  unspecified laterality(RAF-HCC) COMPARISON: CT abdomen pelvis dated 02/11/2007 TECHNIQUE: A spiral CT scan was obtained approximately 3 minutes after administration of IV contrast from the kidneys to the knees. Images were reconstructed in the axial plane. Multiplanar reformatted and MIP images were provided for further evaluation of the vessels. For selected cases, 3D volumerendered images are also provided. VASCULAR FINDINGS: There is overall poor contrast opacification of the venous structures, limitingevaluation. IVC: No evidence of thrombus. Iliac veins: The right iliac veins are chronically occluded/diminutive in size. There are extensive venous collaterals noted throughout the right pelvis extending into the right lower extremity. The number and caliber of the venous collaterals have increased when compared to 02/11/2007. The left iliac veins appear patent. There are also small caliber venous collaterals within the left pelvis extending into the left lower extremity. Anterior abdominal wall venouscollaterals are also noted but fully imaged secondary to patient diameter. The bilateral femoral and popliteal veins appear patent. There is no evidence of acute thrombus. The arterial vasculature is unremarkable on this venous phase time study. NONVASCULAR FINDINGS: LOWER CHEST Unremarkable. : ABDOMEN: HEPATOBILIARY: Unremarkable liver. No biliary ductal dilatation. Gallbladder isremarkable. PANCREAS: Unremarkable. SPLEEN: Unremarkable. ADRENAL GLANDS: Unremarkable. KIDNEYS/URETERS: Unremarkable. BLADDER: Unremarkable. BOWEL/PERITONEUM/RETROPERITONEUM: No bowel obstruction. No acute inflammatoryprocess. No ascites. LYMPH NODES: Extensive right iliac chain and right inguinal adenopathy. A nodal conglomerate in the right inguinal region measures 6.8 x 3.4 cm (2:124). This is only slightly larger when compared to 02/11/2007. Given interval stabilityover 10 years, these are favored to be  reactive/secondary to congestion. REPRODUCTIVE: Unremarkable. BONES/SOFT TISSUES: No acute osseous abnormalities. Postsurgical changes in the right inguinal and left medial thigh region. Right greater than left softtissue edema throughout the lower extremities. [facility] RAD Imaging Results - CT Pelvic Venogram Run Off Procedure Note Interface, Rad Results In - 04/11/2016 3:28 PM EST EXAM: CT PELVIC VENOGRAM RUN OFF DATE: 04/11/2016 1:28 PM ACCESSION: [id number] UN DICTATED: 04/11/2016 2:18 PM INTERPRETATION LOCATION: [facility] CLINICAL INDICATION: 41 years old Male with h/o RLE DVT and massive lymphedema of both LE's. Eval for venous outflow obstruction vs extrinsic mass- L97.203-Skin ulcer of calf with necrosis of muscle, unspecified laterality (RAF-HCC) COMPARISON: CT abdomen pelvis dated 02/11/2007 TECHNIQUE: A spiral CT scan was obtained approximately 3 minutes after administration of IV contrast from the kidneys to the knees. Images were reconstructed in the axial plane. Multiplanar reformatted and MIP images were provided for further evaluation of the vessels. For selected cases, 3D volume rendered images are also provided. VASCULAR FINDINGS: There is overall poor contrast opacification of the venous structures, limiting evaluation. IVC: No evidence of thrombus. Iliac veins: The right iliac veins are chronically occluded/diminutive in size. There are extensive venous collaterals noted throughout the right pelvis extending into the right lower extremity. The number and caliber of the venous collaterals have increased when compared to 02/11/2007. The left iliac veins appear patent. There are also small caliber venous collaterals within the left pelvis extending into the left lower extremity. Anterior abdominal wall venous collaterals are also noted but fully imaged secondary to patient diameter. The bilateral femoral and popliteal veins appear patent. There is no evidence of acute  thrombus. The arterial vasculature is unremarkable on this venous phase time study. NONVASCULAR FINDINGS: LOWER CHEST Unremarkable. : ABDOMEN: HEPATOBILIARY: Unremarkable liver. No biliary ductal dilatation. Gallbladder is remarkable. PANCREAS: Unremarkable. SPLEEN: Unremarkable.  ADRENAL GLANDS: Unremarkable. KIDNEYS/URETERS: Unremarkable. BLADDER: Unremarkable. BOWEL/PERITONEUM/RETROPERITONEUM: No bowel obstruction. No acute inflammatory process. No ascites. LYMPH NODES: Extensive right iliac chain and right inguinal adenopathy. A nodal conglomerate in the right inguinal region measures 6.8 x 3.4 cm (2:124). This is only slightly larger when compared to 02/11/2007. Given interval stability over 10 years, these are favored to be reactive/secondary to congestion. REPRODUCTIVE: Unremarkable. BONES/SOFT TISSUES: No acute osseous abnormalities. Postsurgical changes in the right inguinal and left medial thigh region. Right greater than left soft tissue edema throughout the lower extremities. IMPRESSION: -Limited study secondary to poor opacification of venous structures. No definite evidence of acute deep vein thrombosis. 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 -Extensive right iliac chain and right inguinal adenopathy, favored to be reactive/secondary to congestion. Imaging Results - CT Pelvic Venogram Run Off Performing Organization Address City/State/Zipcode Phone Number [facility] [address] [place], [place] [address] Surgical summary as listed below; Assessment: [name] is a 41 y.o. male with Kohls history of right lower extremity chronic obstructive deep venous disease with recurrent ulceration, absence of right iliac vein, prior left to right femoral vein bypass. Patient continues with recurrent ulceration, his weight precludes him from further vascular studies at this time. If he is able  to lose 50-100 pounds we would be able to do left lower extremity venogram possible intervention with 50% chance of re- opening occlusion per Dr. Jacolyn Reedy. For global health, weight loss, potential improvement in his chronic venous insufficiency would appreciateinput from bariatric surgery group for recommendations. 09/26/20; patient comes in today with not much improvement. He has also some odor. 3 open areas and this linear area in his mid calf require debridement. We have been using silver alginate under compression. I gave him Keflex last week for what I thought was cellulitis in the right medial thigh he is completing this and I think this is better. 7/26; patient comes in today with again necrotic debris over these wounds. According to our intake nurse extreme malodor and drainage. We have been using silver alginate under compression. He is complaining about pain in the wound area. 7/29; Patient presents for follow-up. He reports improvement to the wound. He has finished taking Keflex. He has no issues or complaints today. 10/17/2020 upon evaluation today patient appears to be doing poorly in regard to his leg ulcer. He has been tolerating the dressing changes without complication. Fortunately there does not appear to be any signs of active infection at this time. No fevers, chills, nausea, vomiting, or diarrhea. 8/23; the patient has 2 areas anteriorly and a larger area medially on the right lower leg. He comes in today with his compression wrap slipping down. We do not have good edema control. We have been using silver alginate. I believe he completed a course of antibioticso Levaquin given to him 2 weeks ago at his last visit. He states his girlfriend who is a nurse changes his dressings. We have not seen this in 2 weeks. He just received a compounded topical antibiotic based on I believe a deep tissue culture that was done. I do not have a result of the deep tissue culture today and he forgot the  compounded antibiotic. I will try to clarify this next time I tried to send him back to vascular at [facility] for review of his central venous obstruction although they would not even see him I think because of noncompliance, or at  least perceived noncompliance with regards to the recommendations for weight loss, consideration of bariatric assessment for surgery etc. 8/30; although the patient's wounds anteriorly on the right lower leg extending medially were debrided today. Better looking wound surface he has better edema control still under 4-layer compression. He tells me he is using his compression pump twice a day however they are greater than 70 years old and he might benefit from a new set. He is using his Jodie Echevaria compounded antibiotic Since the last time he was here he saw Dr. Durwin Nora at vein and vascular at our request. He was felt to have adequate arterial flow for healing although there was still the issue about whether he might need an angiogram if things still. 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 He did not talk about the s previous central venous procedures that have been done at [facility]. That information is available in care everywhere however based on the duplex exam he was not felt to be a candidate for any venous procedures on the right. He has no significant venous reflux on the right and has had previously had a right greater saphenous vein ablated 9/6; the patient has a cluster of small areas over the right anterior mid tibia as well as an area medially. That the air 1 medially is more confluent all of them look like they have a better surface. We have been putting him in 4-layer compression and using his compression pumps twice a day 9/13; both of the patient's wound areas appear somewhat better especially the large area medially. 9/27; 2-week follow-up. His wife who is a Designer, jewellery has been changing his 4-layer compression with Hydrofera Blue and Keystone I  think the measurements of the 2 remaining areas 1 on the right anterior tibia and 1 on the right lateral are better. In another setting I might consider advanced treatment product here. He is using his compression pumps 10/11; patient presents for follow-up. He has no issues or complaints today. His girlfriend continues to change his compression wrap with Hydrofera Blue and [place]. He denies signs of infection. 10/25; patient presents for follow-up. He reports a new wound to the right lateral aspect. He states the area was itching and he scratched developing a wound. He denies signs of infection. 11/11; patient presents for follow-up. He no longer has a wound to the right lateral aspect. He had to use an Radio broadcast assistant for the past week due to short supply. He has no issues or complaints today. He denies signs of infection. READMISSION 09/07/2021: The patient is back with new wounds on his right lower extremity. He says that they first emerged around the first of the year. He was seen in the emergency department on June 22 for angioedema either secondary to his ARB or a new lotion that he tried. He was given prednisone. At the same time, the provider in the ER ordered doxycycline to cover his leg ulcer and applied a Unna boot. He did not have an Radio broadcast assistant on in clinic today but his significant other and he have been applying Neosporin with Kerlix and Coban wrapping. He denies any fevers or chills. No pain. No significant odor. All of the wounds have the fat layer exposed and there is slough buildup on all open surfaces. 09/14/2021: All of the wounds are little bit smaller today. They still have slough accumulation on the surface. We have been using silver alginate with 4-layer compression. A culture taken  last week demonstrated a polymicrobial population, chiefly Escherichia coli. He had already been prescribed doxycycline in the ER and this was considered to be second line treatment per the PCR culture  pharmacist. I did not prescribe any new antibiotics but [facility]. 7/14; I think his wounds are about the same 3 large areas on the right medial lower leg and a more superficial area on the right mid lower leg. [facility] which includes gentamicin, vancomycin and ciprofloxacin. He will start this today. He also is just had his compression pumps repaired and he is now prepared to use them I have asked him to do this twice a day. Objective Constitutional Patient is hypertensive.. Pulse regular and within target range for patient.[name]. Respirations regular, non-labored and within target range.. Temperature is normal and within the target range for the patient.[name]. Appears in no distress. Vitals Time Taken: 8:52 AM, Height: 74 in, Weight: 450 lbs, BMI: 57.8, Temperature: 97.6 F, Pulse: 76 bpm, Respiratory Rate: 20 breaths/min, Blood Pressure: 184/128 mmHg. General Notes: Wound exam; wounds are about the same. They appear clean and noninfected. However the edema control in his legs is not that good. No evidence of surrounding infection Integumentary (Hair, Skin) Wound #36 status is Open. Original cause of wound was Gradually Appeared. The date acquired was: 03/12/2021. The wound has been in treatment 2 weeks. The wound is located on the Right,Posterior Lower Leg. The wound measures 5.6cm length x 3.8cm width x 0.2cm depth; 16.713cm^2 area and 3.343cm^3 volume. There is Fat Layer (Subcutaneous Tissue) exposed. There is no tunneling or undermining noted. There is a medium amount of serosanguineous drainage noted. The wound margin is distinct with the outline attached to the wound base. There is large (67-100%) red granulation within the wound bed. There is a small (1-33%) amount of necrotic tissue within the wound bed including Eschar and Adherent Slough. Wound #36 status is Open. Original cause of wound was Gradually Appeared.  The date acquired was: 03/12/2021. The wound has been in treatment 2 weeks. The wound is located on the Right,Posterior Lower Leg. The wound measures 5.6cm length x 3.8cm width x 0.2cm depth; 16.713cm^2 area and 3.343cm^3 volume. Wound #37 status is Open. Original cause of wound was Gradually Appeared. The date acquired was: 03/12/2021. The wound has been in treatment 2 weeks. The wound is located on the Right,Medial Lower Leg. The wound measures 3.7cm length x 2.1cm width x 0.2cm depth; 6.103cm^2 area and 1.221cm^3 volume. There is Fat Layer (Subcutaneous Tissue) exposed. There is no tunneling or undermining noted. There is a medium amount of serosanguineous drainage noted. The wound margin is distinct with the outline attached to the wound base. There is medium (34-66%) red granulation within the wound bed. There is a medium (34-66%) amount of necrotic tissue within the wound bed including Adherent Slough. Wound #38 status is Open. Original cause of wound was Gradually Appeared. The date acquired was: 03/12/2021. The wound has been in treatment 2 weeks. The wound is located on the Right,Anterior Lower Leg. The wound measures 3cm length x 2.5cm width x 0.1cm depth; 5.89cm^2 area and 0.589cm^3 volume. There is Fat Layer (Subcutaneous Tissue) exposed. There is no tunneling or undermining noted. There is a medium amount of serosanguineous drainage noted. The wound margin is distinct with the outline attached to the wound base. There is small (1-33%) red granulation within the wound bed. There is a large (67-100%) amount of necrotic  tissue within the wound bed including Eschar and Adherent Slough. Wound #39 status is Open. Original cause of wound was Gradually Appeared. The date acquired was: 03/12/2021. The wound has been in treatment 2 weeks. The wound is located on the Right,Lateral Lower Leg. The wound measures 1.3cm length x 1cm width x 0.1cm depth; 1.021cm^2 area and 0.102cm^3 volume. There is Fat Layer  (Subcutaneous Tissue) exposed. There is no tunneling or undermining noted. There is a medium amount of serosanguineous drainage noted. The wound margin is distinct with the outline attached to the wound base. There is large (67-100%) red granulation within the wound bed. There is a small (1-33%) amount of necrotic tissue within the wound bed including Adherent Slough. Assessment Active Problems ICD-10 Non-pressure chronic ulcer of other part of right lower leg with fat layer exposed Heart failure, unspecified Essential (primary) hypertension Type 2 diabetes mellitus with other skin ulcer Morbid (severe) obesity due to excess calories Lymphedema, not elsewhere classified Plan ri Follow-up Appointments: Other: - Pt requesting to hold off on scheduling another appointment until he can get his insurance worked out [employer]: May shower with protection but do not get wound dressing(s) wet. - May use a cast wrap to put over wraps to shower; you can buy these at [facility] or [facility] Edema Control - Lymphedema / SCD / Other: Lymphedema Pumps. Use Lymphedema pumps on leg(s) 2-3 times a day for 45-60 minutes. If wearing any wraps or hose, do not remove them. Continue exercising as instructed. Elevate legs to the level of the heart or above for 30 minutes daily and/or when sitting, a frequency of: - throughout the day Avoid standing for [place] periods of time. Patient to wear own compression stockings every day. - on Left Leg WOUND #36: - Lower Leg Wound Laterality: Right, Posterior Cleanser: Normal Saline 1 x Per Week/30 Days Discharge Instructions: Cleanse the wound with Normal Saline prior to applying a clean dressing using gauze sponges, not tissue or cotton balls. Cleanser: Soap and Water 1 x Per Week/30 Days Discharge Instructions: May shower and wash wound with dial antibacterial soap and water prior to dressing change. Prim Dressing: KerraCel Ag Gelling Fiber Dressing, 4x5 in  (silver alginate) 1 x Per Week/30 Days ary Discharge Instructions: Apply silver alginate to wound bed as instructed Secondary Dressing: ABD Pad, 8x10 1 x Per Week/30 Days Discharge Instructions: Apply over primary dressing as directed. Secondary Dressing: Woven Gauze Sponge, Non-Sterile 4x4 in 1 x Per Week/30 Days Discharge Instructions: Apply over primary dressing as directed. Secured With: Paper T ape, 2x10 (in/yd) 1 x Per Week/30 Days Discharge Instructions: Secure dressing with tape as directed. Com pression Wrap: FourPress (4 layer compression wrap) 1 x Per Week/30 Days Discharge Instructions: Apply four layer compression as directed. May also use [place] CoFlex 2 layer compression system as alternative. WOUND #37: - Lower Leg Wound Laterality: Right, Medial Cleanser: Normal Saline 1 x Per Week/30 Days Discharge Instructions: Cleanse the wound with Normal Saline prior to applying a clean dressing using gauze sponges, not tissue or cotton balls. Cleanser: Soap and Water 1 x Per Week/30 Days Discharge Instructions: May shower and wash wound with dial antibacterial soap and water prior to dressing change. Prim Dressing: KerraCel Ag Gelling Fiber Dressing, 4x5 in (silver alginate) 1 x Per Week/30 Days ary Discharge Instructions: Apply silver alginate to wound bed as instructed Secondary Dressing: ABD Pad, 8x10 1 x Per Week/30 Days Discharge Instructions: Apply over primary dressing as directed. Secondary Dressing: Woven Gauze  Sponge, Non-Sterile 4x4 in 1 x Per Week/30 Days Discharge Instructions: Apply over primary dressing as directed. Secured With: Paper T ape, 2x10 (in/yd) 1 x Per Week/30 Days Discharge Instructions: Secure dressing with tape as directed. Com pression Wrap: FourPress (4 layer compression wrap) 1 x Per Week/30 Days Discharge Instructions: Apply four layer compression as directed. May also use Miliken CoFlex 2 layer compression system as alternative. WOUND #38: - Lower  Leg Wound Laterality: Right, Anterior Cleanser: Normal Saline 1 x Per Week/30 Days Discharge Instructions: Cleanse the wound with Normal Saline prior to applying a clean dressing using gauze sponges, not tissue or cotton balls. Cleanser: Soap and Water 1 x Per Week/30 Days Discharge Instructions: May shower and wash wound with dial antibacterial soap and water prior to dressing change. Prim Dressing: KerraCel Ag Gelling Fiber Dressing, 4x5 in (silver alginate) 1 x Per Week/30 Days ary Discharge Instructions: Apply silver alginate to wound bed as instructed Secondary Dressing: ABD Pad, 8x10 1 x Per Week/30 Days Discharge Instructions: Apply over primary dressing as directed. Secondary Dressing: Woven Gauze Sponge, Non-Sterile 4x4 in 1 x Per Week/30 Days Discharge Instructions: Apply over primary dressing as directed. Secured With: Paper T ape, 2x10 (in/yd) 1 x Per Week/30 Days Discharge Instructions: Secure dressing with tape as directed. Com pression Wrap: FourPress (4 layer compression wrap) 1 x Per Week/30 Days Discharge Instructions: Apply four layer compression as directed. May also use Miliken CoFlex 2 layer compression system as alternative. WOUND #39: - Lower Leg Wound Laterality: Right, Lateral Cleanser: Normal Saline 1 x Per Week/30 Days Discharge Instructions: Cleanse the wound with Normal Saline prior to applying a clean dressing using gauze sponges, not tissue or cotton balls. Cleanser: Soap and Water 1 x Per Week/30 Days Discharge Instructions: May shower and wash wound with dial antibacterial soap and water prior to dressing change. Prim Dressing: KerraCel Ag Gelling Fiber Dressing, 4x5 in (silver alginate) 1 x Per Week/30 Days ary Discharge Instructions: Apply silver alginate to wound bed as instructed Secondary Dressing: ABD Pad, 8x10 1 x Per Week/30 Days Discharge Instructions: Apply over primary dressing as directed. Secondary Dressing: Woven Gauze Sponge, Non-Sterile 4x4  in 1 x Per Week/30 Days Discharge Instructions: Apply over primary dressing as directed. Secured With: Paper T ape, 2x10 (in/yd) 1 x Per Week/30 Days Discharge Instructions: Secure dressing with tape as directed. Com pression Wrap: FourPress (4 layer compression wrap) 1 x Per Week/30 Days Discharge Instructions: Apply four layer compression as directed. May also use Miliken CoFlex 2 layer compression system as alternative. 1. We continued with silver alginate as the primary dressing 2. I have asked him to use his lymphedema pumps twice a day 3. Starting his Keystone antibiotics 4. His wife is changing this daily not sure about her ability to do the 4-layer wraps. Hopefully the compression pumps will help with this as well 5ADDENDUM. After I left the room and the patient's wounds were dressed in his leg wrap he started to complain of pruritus first localized to his left leg and then more generalized. By the time he had gotten out to the elevators outside our lobby he was somewhat hypotensive, tachypneic. We brought him back in the room removed his dressings clean these wounds. He had started the topical antibiotic from [facility] including vancomycin gentamicin and ciprofloxacin I believe however in discussing things with the nurse who looked after him it was clear that she applied topical lidocaine, the patient did already had a known lidocaine allergy. I suspect  the latter may be the actual culprit here. We had to call EMS and transported him to the ER. We do not have racemic epinephrine. Likely anaphylactic/anaphylactoid reaction secondary to lidocaine. Fortunately his respiratory status was stable there was no oral swelling Electronic Signature(s) Signed: 09/21/2021 4:59:49 PM By: Baltazar Najjar MD Entered By: Baltazar Najjar on 09/21/2021 12:40:15 -------------------------------------------------------------------------------- SuperBill Details Patient Name: Date of Service: [name], A DRIA N  T. 09/21/2021 Medical Record Number: [id number] Patient Account Number: [id number] Date of Birth/Sex: Treating RN: February 27, 1981 (41 y.o. [name] Primary Care Provider: Georganna Skeans Other Clinician: Referring Provider: Treating Provider/Extender: Marcie Bal in Treatment: 2 Diagnosis Coding ICD-10 Codes Code Description [phone] Non-pressure chronic ulcer of other part of right lower leg with fat layer exposed I50.9 Heart failure, unspecified I10 Essential (primary) hypertension E11.622 Type 2 diabetes mellitus with other skin ulcer E66.01 Morbid (severe) obesity due to excess calories I89.0 Lymphedema, not elsewhere classified Facility Procedures CPT4 Code: [id number] Description: 99214 - WOUND CARE VISIT-LEV 4 EST PT Modifier: Quantity: 1 Physician Procedures Electronic Signature(s) Signed: 09/21/2021 4:59:49 PM By: Baltazar Najjar MD Entered By: Baltazar Najjar on 09/21/2021 12:40:31

## 2021-09-21 NOTE — ED Provider Notes (Signed)
[name]-EMERGENCY DEPT Provider Note   CSN: [id number] Arrival date & time: 09/21/21  1103     History  No chief complaint on file.   [name] is a 41 y.o. male.  HPI 41 year old male presents today via EMS from wound care clinic with report of allergic reaction to topical lidocaine.  EMS reports that he has a known history of lidocaine allergy.  He had lidocaine placed topically on his right lower extremity wound.  He became itchy with a rash, dyspneic and was given prehospital IM Benadryl and epi EMS reports the patient was very agitated moving around they were unable to get good breath sounds, heart rate, or blood pressure    Home Medications Prior to Admission medications   Medication Sig Start Date End Date Taking? Authorizing Provider  predniSONE (DELTASONE) 10 MG tablet Take 2 tablets (20 mg total) by mouth in the morning and at bedtime. 09/21/21  Yes Margarita Grizzle, MD  Accu-Chek Softclix Lancets lancets Use as instructed 06/24/19   Mayers, Cari S, PA-C  carvedilol (COREG) 25 MG tablet TAKE 1 TABLET BY MOUTH TWICE A DAY 07/17/21   Chilton Si, MD  doxycycline (VIBRAMYCIN) 100 MG capsule Take 1 capsule (100 mg total) by mouth 2 (two) times daily. 08/30/21   Mardella Layman, MD  furosemide (LASIX) 80 MG tablet Take 1 tablet (80 mg total) by mouth 2 (two) times daily. 03/16/21 06/14/21  Chilton Si, MD  gabapentin (NEURONTIN) 300 MG capsule TAKE 1 CAPSULE BY MOUTH EVERYDAY AT BEDTIME 09/14/21   Georganna Skeans, MD  glucose blood (ACCU-CHEK AVIVA PLUS) test strip Use as instructed 06/24/19   Mayers, Cari S, PA-C  hydrALAZINE (APRESOLINE) 25 MG tablet Take 1 tablet (25 mg total) by mouth in the morning and at bedtime. 08/30/21 11/28/21  Alver Sorrow, NP  meloxicam (MOBIC) 15 MG tablet Take 1 tablet by mouth as needed.    [provider]  metFORMIN (GLUCOPHAGE) 500 MG tablet Take 1 tablet (500 mg total) by mouth daily with breakfast. 09/09/20    Rema Fendt, NP  metolazone (ZAROXOLYN) 2.5 MG tablet Take 2.5 mg by mouth as needed. AS NEEDED FOR WEIGHT GAIN OF 5 POUNDS Patient not taking: Reported on 05/24/2021    [provider]  Misc. Devices (SCD SOFT SLEEVES/KNEE LENGTH) MISC 2 each by Does not apply route as needed. 10/11/20 10/11/21  Rema Fendt, NP  spironolactone (ALDACTONE) 25 MG tablet Take 1 tablet (25 mg total) by mouth daily. 05/24/21   Alver Sorrow, NP  traMADol (ULTRAM) 50 MG tablet Take 50 mg by mouth every 6 (six) hours as needed for moderate pain. 10/03/20   [provider]      Allergies    Entresto [sacubitril-valsartan], Lidocaine, Latex, Other, and Peanut-containing drug products    Review of Systems   Review of Systems  Physical Exam Updated Vital Signs BP (!) 150/83   Pulse 74   Temp (!) 97.5 F (36.4 C) (Oral)   Resp 17   SpO2 100%  Physical Exam Vitals and nursing note reviewed.  Constitutional:      General: He is in acute distress.     Appearance: He is obese.  HENT:     Head: Normocephalic.     Right Ear: External ear normal.     Left Ear: External ear normal.     Nose: Nose normal.     Mouth/Throat:     Mouth: Mucous membranes are moist.  Pharynx: Oropharynx is clear.  Eyes:     Pupils: Pupils are equal, round, and reactive to light.  Cardiovascular:     Rate and Rhythm: Normal rate and regular rhythm.     Pulses: Normal pulses.  Pulmonary:     Effort: Pulmonary effort is normal.     Breath sounds: Normal breath sounds.  Abdominal:     Palpations: Abdomen is soft.  Musculoskeletal:        General: Normal range of motion.     Cervical back: Normal range of motion.  Skin:    Capillary Refill: Capillary refill takes less than 2 seconds.     Comments: Wounds to right lower extremity EMS reported rash in the right upper chest which appears to have resolved  Neurological:     General: No focal deficit present.     Mental Status: He is alert.  Psychiatric:         Mood and Affect: Mood normal.     ED Results / Procedures / Treatments   Labs (all labs ordered are listed, but only abnormal results are displayed) Labs Reviewed  BLOOD GAS, ARTERIAL - Abnormal; Notable for the following components:      Result Value   pO2, Arterial 59 (*)    All other components within normal limits  BLOOD GAS, ARTERIAL - Abnormal; Notable for the following components:   pO2, Arterial 78 (*)    All other components within normal limits  I-STAT CHEM 8, ED - Abnormal; Notable for the following components:   Creatinine, Ser 1.30 (*)    Glucose, Bld 191 (*)    Calcium, Ion 1.09 (*)    All other components within normal limits    EKG None  Radiology DG Chest Port 1 View  Result Date: 09/21/2021 CLINICAL DATA:  Dyspnea EXAM: PORTABLE CHEST 1 VIEW COMPARISON:  Chest radiograph March 20, 2016 FINDINGS: Cardiac enlargement is similar prior. Apparent widening of the vascular pedicle is likely related to technique. No overt pulmonary edema or focal airspace consolidation. No visible pleural effusion or pneumothorax. No acute osseous abnormality. IMPRESSION: Similar cardiac enlargement without overt pulmonary edema or focal airspace consolidation. Apparent widening of the vascular pedicle is likely related to technique, consider dedicated upright and lateral view radiographs if clinically indicated Electronically Signed   By: Maudry Mayhew M.D.   On: 09/21/2021 11:50    Procedures .Critical Care  Performed by: Margarita Grizzle, MD Authorized by: Margarita Grizzle, MD   Critical care provider statement:    Critical care time (minutes):  65   Critical care was time spent personally by me on the following activities:  Development of treatment plan with patient or surrogate, discussions with consultants, evaluation of patient's response to treatment, examination of patient, ordering and review of laboratory studies, ordering and review of radiographic studies, ordering and  performing treatments and interventions, pulse oximetry, re-evaluation of patient's condition and review of old charts     Medications Ordered in ED Medications  sodium chloride 0.9 % bolus 1,000 mL (0 mLs Intravenous Stopped 09/21/21 1234)    And  0.9 %  sodium chloride infusion ( Intravenous New Bag/Given 09/21/21 1235)  EPINEPHrine (EPI-PEN) injection 0.3 mg (has no administration in time range)  methylPREDNISolone sodium succinate (SOLU-MEDROL) 125 mg/2 mL injection 125 mg (125 mg Intravenous Given 09/21/21 1125)  famotidine (PEPCID) tablet 20 mg (20 mg Oral Given 09/21/21 1125)    ED Course/ Medical Decision Making/ A&P Clinical Course as of 09/21/21 1430  Fri Sep 21, 2021  1141 ABG reviewed interpreted pH is normal 7.38, PCO2 normal at 47 PO2 is decreased at 59 [DR]  1141 I-STAT reviewed interpreted significant for creatinine elevated 1.3, glucose elevated at 191 Normal sodium potassium chloride and hemoglobin [DR]  1221 Chest x-Alani Sabbagh reviewed interpreted no evidence of acute abnormality noted radiologist interpretation concurs [DR]  1358 Patient continues tachypneic with abdominal breathing Oxygen has been removed and sats have remained in the upper 90s Patient is sleepy-fianc states that his breathing pattern is typical to his usual sleeping Unclear if etiology of decreased responsiveness is Benadryl versus patient having some retention due to the oxygen Plan recheck ABG [DR]  1422 PO2 has increased to 78 off of oxygen with stable pH and PCO2 [DR]    Clinical Course User Index [DR] Margarita Grizzle, MD                           Medical Decision Making 41 year old male who was at wound care clinic today.  Lidocaine was placed on the right lower extremity.  Patient has allergic reaction to lidocaine.  He began having a rash.  On EMS arrival he had a rash and was complaining of pain and burning with moving around a lot.  It was difficult for them to get blood pressure and listen to his  lungs prehospital.  They were unable to obtain IV access. On arrival here patient was tachypneic, had some mild rash, and a rapid respiratory rate and sats decreased. Patient received IV Solu-Medrol here.  Patient with sats between 84 and 87% on room air unclear if these are correlating well with heart rate Patient had oxygen placed at 4 L nasal cannula oxygen saturations increased to 89% patient remains tachypneic 11:54 AM Patient continues tachypneic at 30 breaths/min otherwise vital signs are stable with normal heart rate and blood pressure has increased Patient with resolution of dyspnea.  RR 20 sats 97-99% on room air No rash Advised regarding return precautions especially if he were to have worsening or return of his allergic reaction symptoms. Advised regarding steroids and will be on prednisone for the next 5 days Plan EpiPen prescription.  Patient advised regarding using this in the future if any symptoms began and immediate return to the hospital Patient advised to go home and shower off leg which had the lidocaine placed on it. Patient has had medications and is at least 3 hours post epinephrine.  He appears stable for discharge home  Amount and/or Complexity of Data Reviewed Labs: ordered. Decision-making details documented in ED Course. Radiology: ordered and independent interpretation performed. Decision-making details documented in ED Course.  Risk Prescription drug management.           Final Clinical Impression(s) / ED Diagnoses Final diagnoses:  Acute allergic reaction, initial encounter    Rx / DC Orders ED Discharge Orders          Ordered    predniSONE (DELTASONE) 10 MG tablet  2 times daily        09/21/21 1429              Margarita Grizzle, MD 09/21/21 1430

## 2021-09-24 ENCOUNTER — Encounter (HOSPITAL_BASED_OUTPATIENT_CLINIC_OR_DEPARTMENT_OTHER): Payer: Self-pay | Admitting: Cardiovascular Disease

## 2021-09-24 ENCOUNTER — Ambulatory Visit (INDEPENDENT_AMBULATORY_CARE_PROVIDER_SITE_OTHER): Payer: Self-pay | Admitting: Cardiovascular Disease

## 2021-09-24 DIAGNOSIS — I509 Heart failure, unspecified: Secondary | ICD-10-CM

## 2021-09-24 DIAGNOSIS — I5042 Chronic combined systolic (congestive) and diastolic (congestive) heart failure: Secondary | ICD-10-CM

## 2021-09-24 DIAGNOSIS — G4733 Obstructive sleep apnea (adult) (pediatric): Secondary | ICD-10-CM

## 2021-09-24 DIAGNOSIS — I10 Essential (primary) hypertension: Secondary | ICD-10-CM

## 2021-09-24 DIAGNOSIS — Z6841 Body Mass Index (BMI) 40.0 and over, adult: Secondary | ICD-10-CM

## 2021-09-24 MED ORDER — HYDRALAZINE HCL 25 MG PO TABS: 25.0000 mg | ORAL_TABLET | Freq: Two times a day (BID) | ORAL | 3 refills | Status: DC

## 2021-09-24 MED ORDER — FUROSEMIDE 80 MG PO TABS: 80.0000 mg | ORAL_TABLET | Freq: Two times a day (BID) | ORAL | 3 refills | Status: DC

## 2021-09-24 MED ORDER — SPIRONOLACTONE 25 MG PO TABS: 25.0000 mg | ORAL_TABLET | Freq: Every day | ORAL | 3 refills | Status: DC

## 2021-09-24 NOTE — Assessment & Plan Note (Signed)
Would consider Ozempic.

## 2021-09-24 NOTE — Progress Notes (Signed)
Cardiology Office Note   Date:  09/24/2021   ID:  [name], DOB [birth year], MRN [id number]  PCP:  Georganna Skeans, MD  Cardiologist:   Chilton Si, MD   No chief complaint on file.   History of Present Illness: [name] is a 41 y.o. male with chronic heart failure type unknown, hypertension, hyperlipidemia, diabetes, morbid obesity, OSA, and venous insufficiency who presents for follow-up. [name] was admitted 1/10 through 1/26 2018 for acute heart failure exacerbation. Echocardiogram was not interpretable due to body habitus. He was diuresed with IV Lasix with a negative output of 47.2 L. His weight decreased from 631 pounds on admission to 488 pounds at discharge. His creatinine remained stable.  He followed up with [name] on 04/12/16 and was doing well. His weight was 498 pounds. His blood pressure was poorly-controlled. It was noted that carvedilol had been left off his medication list at discharge. This was restarted at that appointment. He followed up with [name] and was doing well. He was exercising and working out with a Systems analyst 3 times per week. on 07/2016.  His blood pressure was slightly elevated at that appointment, but he had come straight from the gym, so no changes were made.   He was feeling well and his weight was up in the setting of going on a cruise and not taking his spironolactone. His medication was resumed. He was also referred to wound care for a non-healing leg ulcer. He last saw [name], NP 10/2020. Prior to that he was treated with metolazone and increased lasix for volume overload. He had recently gone on a cruise and felt palpitations while away. He struggled with motion sickness while on his trip. He was reminded of fluid and sodium restrictions and scheduled for follow-up today.   Today, he is overall feels well, but had to go to the hospital due to an allergic reaction to lidocaine.  He had shortness of breath and  hallucinations.  In the interim he is also had to stop Entresto due to angioedema.  He wanted to let all the medicines wash out of his system and has not been taking his Lasix or spironolactone.  They recommended switching Entresto to hydralazine but he wanted to wait till this appointment before starting to take it.  He has been gaining weight recently. Reports that his breathing has been fine until last Friday when he was sent to hospital with the allergic reaction.  He otherwise has not had any orthopnea or PND.  He continues to see the wound clinic for a right lower extremity ulcer.  He has been using his CPAP machine regularly. The patient denies chest pain, orthopnea. There have been no palpitations, or  lightheadedness or syncope.    Past Medical History:  Diagnosis Date   Chronic venous insufficiency    a. as a premature infant, required venous access for care with subsequent DVT and some sort of procedure -> eventually went on to have vein bypass around 2005.   Hypertension    Lymphedema    Morbid obesity (HCC)    OSA on CPAP     Past Surgical History:  Procedure Laterality Date   balloon stenting Right    right leg   VEIN LIGATION AND STRIPPING       Current Outpatient Medications  Medication Sig Dispense Refill   Accu-Chek Softclix Lancets lancets Use as instructed 100 each 12   carvedilol (COREG) 25 MG tablet TAKE  1 TABLET BY MOUTH TWICE A DAY (Patient taking differently: Take 25 mg by mouth 2 (two) times daily with a meal.) 180 tablet 2   doxycycline (VIBRAMYCIN) 100 MG capsule Take 1 capsule (100 mg total) by mouth 2 (two) times daily. 20 capsule 0   EPINEPHrine 0.3 mg/0.3 mL IJ SOAJ injection Inject 0.3 mg into the muscle as needed for anaphylaxis. 1 each 0   gabapentin (NEURONTIN) 300 MG capsule TAKE 1 CAPSULE BY MOUTH EVERYDAY AT BEDTIME (Patient taking differently: Take 300 mg by mouth at bedtime.) 90 capsule 0   glucose blood (ACCU-CHEK AVIVA PLUS) test strip Use as  instructed 100 each 12   metFORMIN (GLUCOPHAGE) 500 MG tablet Take 1 tablet (500 mg total) by mouth daily with breakfast. 90 tablet 0   Misc. Devices (SCD SOFT SLEEVES/KNEE LENGTH) MISC 2 each by Does not apply route as needed. 2 each 0   NONFORMULARY OR COMPOUNDED ITEM Apply 1 spray topically See admin instructions. SPC Gentamycin (Sulf-Vancomycin-Cipro 40-20-20%) Mix 1 scoop (500 mg) of compounded powder and 15 ml's of saline; spray onto affected area(s) once daily or with dressing changes. Discard after 24 hours.     predniSONE (DELTASONE) 10 MG tablet Take 2 tablets (20 mg total) by mouth in the morning and at bedtime. 20 tablet 0   traMADol (ULTRAM) 50 MG tablet Take 50 mg by mouth every 6 (six) hours as needed for moderate pain.     furosemide (LASIX) 80 MG tablet Take 1 tablet (80 mg total) by mouth 2 (two) times daily. 180 tablet 3   hydrALAZINE (APRESOLINE) 25 MG tablet Take 1 tablet (25 mg total) by mouth in the morning and at bedtime. 180 tablet 3   spironolactone (ALDACTONE) 25 MG tablet Take 1 tablet (25 mg total) by mouth daily. 90 tablet 3   No current facility-administered medications for this visit.    Allergies:   Entresto [sacubitril-valsartan], Lidocaine, Other, Peanut-containing drug products, Hydrofera blue 4"x4" [wound dressings], and Latex    Social History:  The patient  reports that he has never smoked. He has never used smokeless tobacco. He reports current alcohol use of about 4.0 standard drinks of alcohol per week. He reports that he does not use drugs.   Family History:  The patient's family history includes Cancer in his father, maternal grandfather, and maternal grandmother; Diabetes in his mother.    ROS:   Please see the history of present illness. (+) Weight Gain All other systems are reviewed and negative.    PHYSICAL EXAM: VS:  BP 138/88 (BP Location: Right Arm, Patient Position: Sitting, Cuff Size: Large)   Pulse 88   Ht 6\' 2"  (1.88 m)   Wt (!)  521 lb (236.3 kg)   BMI 66.89 kg/m  , BMI Body mass index is 66.89 kg/m. GENERAL:  Well appearing.  HEENT: Pupils equal round and reactive, fundi not visualized, oral mucosa unremarkable NECK:  No jugular venous distention, waveform within normal limits, carotid upstroke brisk and symmetric, no bruits LUNGS:  Clear to auscultation bilaterally HEART:  RRR.  PMI not displaced or sustained,S1 and S2 within normal limits, no S3, no S4, no clicks, no rubs, no murmurs ABD:  Flat, positive bowel sounds normal in frequency in pitch, no bruits, no rebound, no guarding, no midline pulsatile mass, no hepatomegaly, no splenomegaly EXT:  2 plus pulses throughout, trace L LE edema.  R LE wrapped.  No cyanosis no clubbing.  SKIN:  No rashes no nodules NEURO:  Cranial nerves II through XII grossly intact, motor grossly intact throughout [facility]:  Cognitively intact, oriented to person place and time  EKG: 09/24/2021 EKG: Rate 88. Sinus Rhythm. 01/05/2021: EKG was not ordered. 05/04/2018: EKG was not ordered. 10/31/2017: Sinus rhythm.  Rate 85 bpm.  Prior anteroseptal infarct.  Low voltage. 11/05/16: Sinus rhythm.  Rate 72 bpm.  Low voltage.  03/13/16: Sinus rhythm.  Rate 80 bpm.  Nonspecific T wave abnormalities.  LE Venous Reflux 11/02/2020: Summary:  Right:  - No evidence of obvious acute deep vein thrombosis seen in the right  lower extremity, from the common femoral through the popliteal veins.  - No evidence of superficial venous reflux seen in the right greater  saphenous vein, from the mid to distal thigh to mid calf area. Post  ablation / stripping in the thigh area.  - No evidence of superficial venous reflux seen in the right short  saphenous vein, limited visualization.  - Venous reflux is noted in the right common femoral vein.  - No compression and no flow in varicosities at the medial knee suggestive  of a history of superficial thrombophlebitis  - Enlarged lymph node noted in groin    NOTE: This was a technically difficult and limited exam   Echo 03/21/2016: Impressions:  - Uninterpretable study, even after Definity contrast    administration.   Recent Labs: 10/30/2020: ALT 17; Magnesium 2.1; Platelets 245; TSH 2.190 09/21/2021: BUN 15; Creatinine, Ser 1.30; Hemoglobin 14.3; Potassium 3.7; Sodium 139    Lipid Panel    Component Value Date/Time   CHOL 174 06/23/2019 1750   TRIG 122 06/23/2019 1750   HDL 41 06/23/2019 1750   CHOLHDL 4.2 06/23/2019 1750   LDLCALC 111 (H) 06/23/2019 1750      Wt Readings from Last 3 Encounters:  09/24/21 (!) 521 lb (236.3 kg)  09/02/21 (!) 490 lb (222.3 kg)  07/02/21 (!) 490 lb (222.3 kg)      ASSESSMENT AND PLAN:  OSA (obstructive sleep apnea) He has been using a CPAP.  He has a sleep study pending to adjust his settings.   Chronic congestive heart failure (HCC) Unknown whether he has systolic or diastolic dysfunction.  Echo was not interpretable due to body habitus.  He is very volume overloaded today his weight is up.  This is in the setting of not taking his medications.  He will resume carvedilol, Lasix, and spironolactone.  Agree with starting the hydralazine 25 mg twice daily.  He will check his blood pressures and weight.  He has follow-up with his PCP in a week or 2.  He notes that if his weight does not start coming down he needs to call our office and be seen.  Class 3 severe obesity due to excess calories with serious comorbidity and body mass index (BMI) of 60.0 to 69.9 in adult [facility]) Would consider Ozempic.    Current medicines are reviewed at length with the patient today.  The patient does not have concerns regarding medicines.  The following changes have been made: Resume medications.  Agree with adding hydralazine  Labs/ tests ordered today include:   Orders Placed This Encounter  Procedures   EKG 12-Lead     Disposition:    FU with Azani Brogdon C. Duke Salvia, MD, [facility] in 3-4 months.  APP 2  months.   I,Tinashe Williams,acting as a Neurosurgeon for [employer], MD.,have documented all relevant documentation on the behalf of Chilton Si, MD,as directed by  Chilton Si, MD while  in the presence of Chilton Si, MD.   I, Shaia Porath C. Duke Salvia, MD have reviewed all documentation for this visit.  The documentation of the exam, diagnosis, procedures, and orders on 09/24/2021 are all accurate and complete.

## 2021-09-24 NOTE — Telephone Encounter (Signed)
Calling back for update, patient has appt tomorrow at 9. They are need the orderer for the patient or they will have to reschedule him. Please advise

## 2021-09-24 NOTE — Telephone Encounter (Signed)
Please advise 

## 2021-09-24 NOTE — Assessment & Plan Note (Signed)
He has been using a CPAP.  He has a sleep study pending to adjust his settings.

## 2021-09-24 NOTE — Patient Instructions (Signed)
Medication Instructions:  Your physician has recommended you make the following change in your medication:   RESUME: LASIX 80MG  TWICE DAILY  RESUME: SPIRONOLACTONE 25MG  DAILY   START: HYDRALAZINE 25MG  TWICE DAILY  *If you need a refill on your cardiac medications before your next appointment, please call your pharmacy*   Lab Work: NONE ORDERED TODAY   Testing/Procedures: NONE ORDERED TODAY    Follow-Up: At [facility], you and your health needs are our priority.  As part of our continuing mission to provide you with exceptional heart care, we have created designated Provider Care Teams.  These Care Teams include your primary Cardiologist (physician) and Advanced Practice Providers (APPs -  Physician Assistants and Nurse Practitioners) who all work together to provide you with the care you need, when you need it.  We recommend signing up for the patient portal called "MyChart".  Sign up information is provided on this After Visit Summary.  MyChart is used to connect with patients for Virtual Visits (Telemedicine).  Patients are able to view lab/test results, encounter notes, upcoming appointments, etc.  Non-urgent messages can be sent to your provider as well.   To learn more about what you can do with MyChart, go to .    Your next appointment:   2 MONTHS WITH , NP  4 MONTHS WITH DR. Bolinas   Other Instructions IF YOUR WEIGHT CONTINUES TO GO UP BEFORE YOUR PCP APPOINTMENT PLEASE LET [facility] KNOW  Important Information About Sugar

## 2021-09-24 NOTE — Assessment & Plan Note (Signed)
Unknown whether he has systolic or diastolic dysfunction.  Echo was not interpretable due to body habitus.  He is very volume overloaded today his weight is up.  This is in the setting of not taking his medications.  He will resume carvedilol, Lasix, and spironolactone.  Agree with starting the hydralazine 25 mg twice daily.  He will check his blood pressures and weight.  He has follow-up with his PCP in a week or 2.  He notes that if his weight does not start coming down he needs to call our office and be seen.

## 2021-09-24 NOTE — Telephone Encounter (Signed)
Will route for sleep coordinator to handle.   Thanks!

## 2021-09-25 NOTE — Telephone Encounter (Signed)
Patient seen by Dr Duke Salvia 7/17

## 2021-10-01 NOTE — Telephone Encounter (Signed)
Order sent to [facility] for cpap machine

## 2021-10-08 NOTE — Telephone Encounter (Addendum)
Hello Dr Tresa Endo, this patient needs a Rx for a single pressure setting. His cpap order was written for auto 18-20 cm H20. His machine is (41 yrs old) he did not get the new one you ordered because he cannot afford a new one. Please write the new set pressure as his cpap machine is not auto capable. Per Dr Tresa Endo- On his CPAP titration study he was titrated and needed 18 cm of water.  I  recommend still setting him for auto therapy with a range that can increase to 20 cm due to position and potential worsening with REM sleep.  For this reason I did not just set him at 18 cm water pressure but set him at a range of 18 to 20 cm.   Patient did not get the new machine that is auto capable he kept his old machine and it wont do the 18-20. says he cant afford it.  Per Dr Tresa Endo OK, then set at 18 cm

## 2021-10-08 NOTE — Telephone Encounter (Signed)
Order faxed to [facility] for Resmed cpap 18 cm H20.

## 2021-10-17 ENCOUNTER — Ambulatory Visit: Payer: Self-pay | Admitting: Family Medicine

## 2021-11-22 ENCOUNTER — Telehealth (HOSPITAL_BASED_OUTPATIENT_CLINIC_OR_DEPARTMENT_OTHER): Payer: Self-pay | Admitting: Family

## 2021-11-22 NOTE — Telephone Encounter (Signed)
[facility] [phone] is his cpap company.

## 2021-11-22 NOTE — Telephone Encounter (Signed)
Called to confirm appt fpr 11/26/2021. Patient wanted to reschedule and now rescheduled for 12/14/2021 at 3:35pm. He asked if we had his numbers from his cpap machine.

## 2021-11-26 ENCOUNTER — Ambulatory Visit (HOSPITAL_BASED_OUTPATIENT_CLINIC_OR_DEPARTMENT_OTHER): Payer: Self-pay | Admitting: Family

## 2021-12-09 ENCOUNTER — Other Ambulatory Visit (HOSPITAL_BASED_OUTPATIENT_CLINIC_OR_DEPARTMENT_OTHER): Payer: Self-pay | Admitting: Family

## 2021-12-09 DIAGNOSIS — I509 Heart failure, unspecified: Secondary | ICD-10-CM

## 2021-12-09 DIAGNOSIS — I10 Essential (primary) hypertension: Secondary | ICD-10-CM

## 2021-12-13 ENCOUNTER — Telehealth: Payer: Self-pay | Admitting: Family Medicine

## 2021-12-13 NOTE — Telephone Encounter (Signed)
Copied from [place]. Topic: General - Other >> Dec 13, 2021 12:22 PM [name] wrote: Reason for CRM: Pt called for update on the specific number for sleep machine. Pt stated it can not be a range of numbers he needs the specific number. Cb# [phone]

## 2021-12-14 ENCOUNTER — Other Ambulatory Visit: Payer: Self-pay | Admitting: *Deleted

## 2021-12-14 ENCOUNTER — Ambulatory Visit (HOSPITAL_BASED_OUTPATIENT_CLINIC_OR_DEPARTMENT_OTHER): Payer: BC Managed Care – PPO | Admitting: Family

## 2021-12-14 ENCOUNTER — Encounter (HOSPITAL_BASED_OUTPATIENT_CLINIC_OR_DEPARTMENT_OTHER): Payer: Self-pay | Admitting: Family

## 2021-12-14 DIAGNOSIS — Z6841 Body Mass Index (BMI) 40.0 and over, adult: Secondary | ICD-10-CM | POA: Diagnosis not present

## 2021-12-14 DIAGNOSIS — E66813 Obesity, class 3: Secondary | ICD-10-CM

## 2021-12-14 DIAGNOSIS — G4733 Obstructive sleep apnea (adult) (pediatric): Secondary | ICD-10-CM

## 2021-12-14 DIAGNOSIS — I10 Essential (primary) hypertension: Secondary | ICD-10-CM

## 2021-12-14 DIAGNOSIS — I5042 Chronic combined systolic (congestive) and diastolic (congestive) heart failure: Secondary | ICD-10-CM

## 2021-12-14 MED ORDER — CARVEDILOL 25 MG PO TABS: 25.0000 mg | ORAL_TABLET | Freq: Two times a day (BID) | ORAL | 3 refills | Status: DC

## 2021-12-14 MED ORDER — HYDRALAZINE HCL 50 MG PO TABS: 50.0000 mg | ORAL_TABLET | Freq: Two times a day (BID) | ORAL | 3 refills | Status: DC

## 2021-12-14 NOTE — Progress Notes (Signed)
Office Visit    Patient Name: [name] Date of Encounter: 12/14/2021  PCP:  Dorna Mai, [place]  Cardiologist:  Skeet Latch, MD  Advanced Practice Provider:  No care team member to display Electrophysiologist:  None      Chief Complaint    [name] is a 41 y.o. male presents today for heart failure follow up.   Past Medical History    Past Medical History:  Diagnosis Date   Chronic venous insufficiency    a. as a premature infant, required venous access for care with subsequent DVT and some sort of procedure -> eventually went on to have vein bypass around 2005.   Hypertension    Lymphedema    Morbid obesity ([place])    OSA on CPAP    Past Surgical History:  Procedure Laterality Date   balloon stenting Right    right leg   VEIN LIGATION AND STRIPPING      Allergies  Allergies  Allergen Reactions   Entresto [Sacubitril-Valsartan] Swelling and Other (See Comments)    Tongue swelling   Lidocaine Hives, Shortness Of Breath and Other (See Comments)    "burns my skin" also- Patient ended up in the ED   Other Anaphylaxis, Swelling and Other (See Comments)    Tree nuts cause swelling   Peanut-Containing Drug Products Anaphylaxis and Swelling   Hydrofera Blue 4"X4" [Wound Dressings] Nausea Only and Other (See Comments)    Made the patient light-headed and caused sweating also   Latex Hives and Other (See Comments)    Reaction to latex gloves    History of Present Illness    [name] is a 41 y.o. male with a hx of hx of chronic heart failure, hypertension, hyperlipidemia, diabetes, morbid obesity, OSA, venous insufficiency last seen 03/16/21 via video visit by Dr. Oval Linsey.    He was admitted 03/20/16-04/05/16 for acute heart failure. Echocardiogram was not interpretable due to body habitus. He was diuresed with IV Lasix with negative output of 47.2 L. H e was discharged from 631 pounds on admission to 488 pounds on  discharge. His creatnine remained stable.    He was last seen 05/04/18 by Dr. Oval Linsey. He was going to the gym 3-4 days per week. He was wearing Haematologist. He was recommended for follow up in 2 months which was not completed.   Seen 10/06/20 with worsening LE edema, dyspnea on exertion. Metolazone 2.68m one tablet thirty minutes prior to Furosemide was started on Saturdays and Tuesdays. Seen in follow up 10/30/20 with myriad of concerns including LE edema with LRE in [place] boot, hand numbness, palpitations. TSH, magnesium, CMP ordered - due to worsening renal function Metolazone was discontinued. At clinic visit 12/2020 he [facility]23BP well controlled at home though noted sharp RLE pain feeling like leg "locked up". Due to new insurance EDelene Lollwas cost prohibitive but was not using coupon and encouraged to resume with coupon. He was awaiting repeat sleep study.    Phone visit 05/2021 with sleep study ordered - diagnosed with OSA.    ED visit 08/30/2021 for angioedema after using Aveeno lotion around face and lips.  Was recommended to stop Entresto until cardiology follow-up.  Of note could not rule out allergic reaction to lotion.  Provided short course of prednisone as well as doxycycline due to RLE wound/ulcer  ED visit 09/21/2021 after allergic reaction to topical lidocaine at  wound care clinic.  Evaluated in clinic by Dr. Oval Linsey 09/24/2021 volume overloaded in the setting of not taking his medications.  He was to resume carvedilol, Lasix, spironolactone.  Hydralazine 25 mg twice daily added.  Presents today for follow up.  Monitoring BP at home with readings 130s-140s. Not routinely <130/80. Tells me since starting medications back feels pretty good. He is interested in pursuing GLP1 therapy. Reports no shortness of breath nor dyspnea on exertion. Reports no chest pain, pressure, or tightness. No  orthopnea, PND. His swelling has been overall okay -  occasionally swells a bit worse than usual. Reports no palpitations.    EKGs/Labs/Other Studies Reviewed:   The following studies were reviewed today: LE Venous Reflux 11/02/2020: Summary:  Right:  - No evidence of obvious acute deep vein thrombosis seen in the right  lower extremity, from the common femoral through the popliteal veins.  - No evidence of superficial venous reflux seen in the right greater  saphenous vein, from the mid to distal thigh to mid calf area. Post  ablation / stripping in the thigh area.  - No evidence of superficial venous reflux seen in the right short  saphenous vein, limited visualization.  - Venous reflux is noted in the right common femoral vein.  - No compression and no flow in varicosities at the medial knee suggestive  of a history of superficial thrombophlebitis  - Enlarged lymph node noted in groin   NOTE: This was a technically difficult and limited exam    Echo 03/21/2016: Impressions:  - Uninterpretable study, even after Definity contrast    administration.    EKG:  No EKG today.   Recent Labs: 09/21/2021: BUN 15; Creatinine, Ser 1.30; Hemoglobin 14.3; Potassium 3.7; Sodium 139  Recent Lipid Panel    Component Value Date/Time   CHOL 174 06/23/2019 1750   TRIG 122 06/23/2019 1750   HDL 41 06/23/2019 1750   CHOLHDL 4.2 06/23/2019 1750   LDLCALC 111 (H) 06/23/2019 1750    Home Medications   Current Meds  Medication Sig   Accu-Chek Softclix Lancets lancets Use as instructed   EPINEPHrine 0.3 mg/0.3 mL IJ SOAJ injection Inject 0.3 mg into the muscle as needed for anaphylaxis.   furosemide (LASIX) 80 MG tablet Take 1 tablet (80 mg total) by mouth 2 (two) times daily.   gabapentin (NEURONTIN) 300 MG capsule TAKE 1 CAPSULE BY MOUTH EVERYDAY AT BEDTIME (Patient taking differently: Take 300 mg by mouth at bedtime.)   glucose blood (ACCU-CHEK AVIVA PLUS) test strip Use as instructed   metFORMIN (GLUCOPHAGE) 500 MG tablet Take 1 tablet (500  mg total) by mouth daily with breakfast.   NONFORMULARY OR COMPOUNDED ITEM Apply 1 spray topically See admin instructions. SPC Gentamycin (Sulf-Vancomycin-Cipro 40-20-20%) Mix 1 scoop (500 mg) of compounded powder and 15 ml's of saline; spray onto affected area(s) once daily or with dressing changes. Discard after 24 hours.   spironolactone (ALDACTONE) 25 MG tablet Take 1 tablet (25 mg total) by mouth daily.   traMADol (ULTRAM) 50 MG tablet Take 50 mg by mouth every 6 (six) hours as needed for moderate pain.   [DISCONTINUED] carvedilol (COREG) 25 MG tablet TAKE 1 TABLET BY MOUTH TWICE A DAY (Patient taking differently: Take 25 mg by mouth 2 (two) times daily with a meal.)   [DISCONTINUED] doxycycline (VIBRAMYCIN) 100 MG capsule Take 1 capsule (100 mg total) by mouth 2 (two) times daily.   [DISCONTINUED] hydrALAZINE (APRESOLINE) 25 MG tablet Take 1 tablet (25  mg total) by mouth in the morning and at bedtime.   [DISCONTINUED] predniSONE (DELTASONE) 10 MG tablet Take 2 tablets (20 mg total) by mouth in the morning and at bedtime.     Review of Systems      All other systems reviewed and are otherwise negative except as noted above.  Physical Exam    VS:  BP (!) 155/107 (BP Location: Right Arm, Patient Position: Sitting, Cuff Size: Large)   Ht 6' 2"  (1.88 m)   Wt (!) 516 lb 9.6 oz (234.3 kg)   BMI 66.33 kg/m  , BMI Body mass index is 66.33 kg/m.  Wt Readings from Last 3 Encounters:  12/14/21 (!) 516 lb 9.6 oz (234.3 kg)  09/24/21 (!) 521 lb (236.3 kg)  09/02/21 (!) 490 lb (222.3 kg)    GEN: Well nourished, overweight, well developed, in no acute distress. HEENT: normal. Neck: Supple, no JVD, carotid bruits, or masses. Cardiac: RRR, no murmurs, rubs, or gallops. No clubbing, cyanosis.   Radials/PT 2+ and equal bilaterally.  Respiratory:  Respirations regular and unlabored, clear to auscultation bilaterally. GI: Soft, nontender, nondistended. MS: No deformity or atrophy. Skin: Warm and  dry, no rash. RLE with compression sock on with wounds followed by Wound Care.  Neuro:  Strength and sensation are intact. Psych: Normal affect.  Assessment & Plan    Chronic congestive heart failure-unknown systolic or diastolic.  Echo not very interpretable due to body habitus. Weight loss of 5 pounds since Lasix 71m BID and Spironolactone 264mQD resumed. Continue same. No ACR/ARB/ARNI due to previous angioedema. Could consider SLG2i at follow up - defer today as plan to add GLP1. Update CMP, mag today. Low sodium diet, fluid restriction <2L, and daily weights encouraged. Educated to contact our office for weight gain of 2 lbs overnight or 5 lbs in one week.   HTN - BP not at goal <130/80. Increase Hydralazine to 5036mID. Discussed to monitor BP at home at least 2 hours after medications and sitting for 5-10 minutes.   OSA- CPAP compliance encouraged.   Morbid obesity / BMI 66 - Weight loss via diet and exercise encouraged. Discussed the impact being overweight would have on cardiovascular risk. Plan to add GLP1. A1c today. If diabetic, plan to Rx Ozempic. If not diabetic, plan to Rx [facility] to healthy weight and wellness.      Disposition: Follow up in 2-3 month(s) with TifSkeet LatchD or APP.  Signed, CaiLoel DubonnetP 12/14/2021, 4:07 PM [facility]

## 2021-12-14 NOTE — Telephone Encounter (Signed)
Patient was called to figure out what he is asking for. Patient was not sure as to explain it.  Called place to [facility], [place]. Per , Jearld Fenton patient has a machine and would like to use his old machine . Therefor, the order need to say set CPAP to 18.

## 2021-12-14 NOTE — Patient Instructions (Addendum)
Medication Instructions:  Your physician has recommended you make the following change in your medication:   INCREASE Hydralazine to 50mg  twice daily  Pending your lab results we will plan to start Semaglutide (Ozempic or [facility]) for weight loss.  This is a once weekly injection that helps you lose weight and maintain weight loss.  This medication works best when combined with healthy diet and regular exercise.  GLP1 medications help you lose weight by reducing appetite and helping you feel fuller longer. Most common side effects include nausea, vomiting, diarrhea. These side effects can be limited by eating slowly and eating small meals and often improve after a few days.   *If you need a refill on your cardiac medications before your next appointment, please call your pharmacy*   Lab Work: Your physician recommends that you return for lab work today: CMP, A1c, magnesium   If you have labs (blood work) drawn today and your tests are completely normal, you will receive your results only by: [place] (if you have MyChart) OR A paper copy in the mail If you have any lab test that is abnormal or we need to change your treatment, we will call you to review the results.  Testing/Procedures: None ordered today.  Follow-Up: At [facility], you and your health needs are our priority.  As part of our continuing mission to provide you with exceptional heart care, we have created designated Provider Care Teams.  These Care Teams include your primary Cardiologist (physician) and Advanced Practice Providers (APPs -  Physician Assistants and Nurse Practitioners) who all work together to provide you with the care you need, when you need it.  We recommend signing up for the patient portal called "MyChart".  Sign up information is provided on this After Visit Summary.  MyChart is used to connect with patients for Virtual Visits (Telemedicine).  Patients are able to view lab/test results,  encounter notes, upcoming appointments, etc.  Non-urgent messages can be sent to your provider as well.   To learn more about what you can do with MyChart, go to [email or web address].    Your next appointment:   2-3 month(s)  The format for your next appointment:   In Person  Provider:   Skeet Latch, MD or Laurann Montana, NP    Other Instructions  Heart Healthy Diet Recommendations: A low-salt diet is recommended. Meats should be grilled, baked, or boiled. Avoid fried foods. Focus on lean protein sources like fish or chicken with vegetables and fruits. The American Heart Association is a Microbiologist!  American Heart Association Diet and Lifeystyle Recommendations   Exercise recommendations: The American Heart Association recommends 150 minutes of moderate intensity exercise weekly. Try 30 minutes of moderate intensity exercise 4-5 times per week. This could include walking, jogging, or swimming.  Important Information About Sugar

## 2021-12-14 NOTE — Telephone Encounter (Signed)
Order has been rewritten and in provider folder

## 2021-12-21 DIAGNOSIS — Z6841 Body Mass Index (BMI) 40.0 and over, adult: Secondary | ICD-10-CM | POA: Diagnosis not present

## 2021-12-21 DIAGNOSIS — I5042 Chronic combined systolic (congestive) and diastolic (congestive) heart failure: Secondary | ICD-10-CM | POA: Diagnosis not present

## 2021-12-21 DIAGNOSIS — I10 Essential (primary) hypertension: Secondary | ICD-10-CM | POA: Diagnosis not present

## 2021-12-22 LAB — COMPREHENSIVE METABOLIC PANEL
ALT: 9 IU/L (ref 0–44)
AST: 15 IU/L (ref 0–40)
Albumin/Globulin Ratio: 1.4 (ref 1.2–2.2)
Albumin: 4.5 g/dL (ref 4.1–5.1)
Alkaline Phosphatase: 62 IU/L (ref 44–121)
BUN/Creatinine Ratio: 12 (ref 9–20)
BUN: 12 mg/dL (ref 6–24)
Bilirubin Total: 0.5 mg/dL (ref 0.0–1.2)
CO2: 27 mmol/L (ref 20–29)
Calcium: 9.5 mg/dL (ref 8.7–10.2)
Chloride: 98 mmol/L (ref 96–106)
Creatinine, Ser: 0.98 mg/dL (ref 0.76–1.27)
Globulin, Total: 3.2 g/dL (ref 1.5–4.5)
Glucose: 129 mg/dL — ABNORMAL HIGH (ref 70–99)
Potassium: 4.2 mmol/L (ref 3.5–5.2)
Sodium: 140 mmol/L (ref 134–144)
Total Protein: 7.7 g/dL (ref 6.0–8.5)
eGFR: 99 mL/min/{1.73_m2} (ref >59)

## 2021-12-22 LAB — HEMOGLOBIN A1C
Est. average glucose Bld gHb Est-mCnc: 151 mg/dL
Hgb A1c MFr Bld: 6.9 % — ABNORMAL HIGH (ref 4.8–5.6)

## 2021-12-22 LAB — MAGNESIUM: Magnesium: 2.2 mg/dL (ref 1.6–2.3)

## 2021-12-24 ENCOUNTER — Telehealth (HOSPITAL_BASED_OUTPATIENT_CLINIC_OR_DEPARTMENT_OTHER): Payer: Self-pay

## 2021-12-24 MED ORDER — SEMAGLUTIDE(0.25 OR 0.5MG/DOS) 2 MG/3ML ~~LOC~~ SOPN: PEN_INJECTOR | SUBCUTANEOUS | 0 refills | Status: DC

## 2021-12-24 MED ORDER — OZEMPIC (1 MG/DOSE) 4 MG/3ML ~~LOC~~ SOPN: PEN_INJECTOR | SUBCUTANEOUS | 0 refills | Status: DC

## 2021-12-24 NOTE — Telephone Encounter (Addendum)
Called patient and provided the following recommendations, patient is agreeable to Ozempic, will send Rx to pharmacy and complete PA for insurance approval. Patient knows not to pick up until he hears from [place] to insure that insurance will approve.    Ozempic Prior Auth Started:   'Wait for Determination Please wait for [employer] NCPDP 2017 to return a determination."     ----- Message from Loel Dubonnet, NP sent at 12/22/2021 10:18 AM EDT ----- A1c remains in diabetic range. Increased from one year ago. Normal kidneys, liver, electrolytes.   Recommend start Ozempic for diabetes and weight loss (0.25mg  weekly x 4 weeks, 0.5mg  weekly x 4 weeks, 1mg  weekly x 4 weeks)

## 2021-12-24 NOTE — Telephone Encounter (Signed)
Per Cover my meds,   "Your information has been submitted to [place]. To check for an updated outcome later, reopen this PA request from your dashboard.  If [employer] has not responded to your request within 24 hours, contact [place] at [phone]. If you think there may be a problem with your PA request, use our live chat feature at the bottom right."

## 2021-12-25 NOTE — Telephone Encounter (Signed)
Ozempic Prior auth denied per cover my meds, more information requested. Office note and recent labs printed and faxed to appeals line.

## 2021-12-27 NOTE — Telephone Encounter (Signed)
Documentation received that patient ozempic PA approved. Patient notified via my chart message.

## 2022-01-14 ENCOUNTER — Ambulatory Visit (HOSPITAL_BASED_OUTPATIENT_CLINIC_OR_DEPARTMENT_OTHER): Payer: Self-pay | Admitting: Cardiovascular Disease

## 2022-01-23 ENCOUNTER — Encounter (INDEPENDENT_AMBULATORY_CARE_PROVIDER_SITE_OTHER): Payer: Self-pay

## 2022-01-29 NOTE — Progress Notes (Deleted)
Patient ID: EBB CARELOCK, male    DOB: 1980/06/20  MRN: [id number]  CC: No chief complaint on file.   Subjective: [name] is a 41 y.o. male who presents for  His concerns today include:  follow up and request labs  [place] patient. Will need more details from patient.   Patient Active Problem List   Diagnosis Date Noted   Prediabetes 06/24/2019   Chronic congestive heart failure (HCC)    Generalized edema    NSVT (nonsustained ventricular tachycardia) (HCC) 03/25/2016   Hypokalemia 03/25/2016   Acute respiratory failure (HCC) 03/21/2016   SOB (shortness of breath) 03/20/2016   Acute respiratory failure with hypoxia (HCC) 03/20/2016   Abdominal pain 03/20/2016   OSA (obstructive sleep apnea) 03/20/2016   Type 2 diabetes mellitus (HCC) 12/29/2015   Wound infection    Hyperglycemia    Cellulitis 12/26/2015   Chronic acquired lymphedema 12/26/2015   Shortness of breath    Venous stasis ulcer (HCC) 01/09/2011   Hypertension 01/09/2011   Class 3 severe obesity due to excess calories with serious comorbidity and body mass index (BMI) of 60.0 to 69.9 in adult [facility]) 01/09/2011     Current Outpatient Medications on File Prior to Visit  Medication Sig Dispense Refill   Accu-Chek Softclix Lancets lancets Use as instructed 100 each 12   carvedilol (COREG) 25 MG tablet Take 1 tablet (25 mg total) by mouth 2 (two) times daily. 180 tablet 3   EPINEPHrine 0.3 mg/0.3 mL IJ SOAJ injection Inject 0.3 mg into the muscle as needed for anaphylaxis. 1 each 0   furosemide (LASIX) 80 MG tablet Take 1 tablet (80 mg total) by mouth 2 (two) times daily. 180 tablet 3   gabapentin (NEURONTIN) 300 MG capsule TAKE 1 CAPSULE BY MOUTH EVERYDAY AT BEDTIME (Patient taking differently: Take 300 mg by mouth at bedtime.) 90 capsule 0   glucose blood (ACCU-CHEK AVIVA PLUS) test strip Use as instructed 100 each 12   hydrALAZINE (APRESOLINE) 50 MG tablet Take 1 tablet (50 mg total) by mouth in the morning  and at bedtime. 180 tablet 3   metFORMIN (GLUCOPHAGE) 500 MG tablet Take 1 tablet (500 mg total) by mouth daily with breakfast. 90 tablet 0   NONFORMULARY OR COMPOUNDED ITEM Apply 1 spray topically See admin instructions. SPC Gentamycin (Sulf-Vancomycin-Cipro 40-20-20%) Mix 1 scoop (500 mg) of compounded powder and 15 ml's of saline; spray onto affected area(s) once daily or with dressing changes. Discard after 24 hours.     Semaglutide, 1 MG/DOSE, (OZEMPIC, 1 MG/DOSE,) 4 MG/3ML SOPN 1mg  injection weekly for 4 weeks 1 mL 0   Semaglutide,0.25 or 0.5MG /DOS, 2 MG/3ML SOPN Inject 0.25 mg into the skin once a week for 30 days, THEN 0.5 mg once a week. 3 mL 0   spironolactone (ALDACTONE) 25 MG tablet Take 1 tablet (25 mg total) by mouth daily. 90 tablet 3   traMADol (ULTRAM) 50 MG tablet Take 50 mg by mouth every 6 (six) hours as needed for moderate pain.     No current facility-administered medications on file prior to visit.    Allergies  Allergen Reactions   Entresto [Sacubitril-Valsartan] Swelling and Other (See Comments)    Tongue swelling   Lidocaine Hives, Shortness Of Breath and Other (See Comments)    "burns my skin" also- Patient ended up in the ED   Other Anaphylaxis, Swelling and Other (See Comments)    Tree nuts cause swelling   Peanut-Containing Drug Products Anaphylaxis  and Swelling   Hydrofera Blue 4"X4" [Wound Dressings] Nausea Only and Other (See Comments)    Made the patient light-headed and caused sweating also   Latex Hives and Other (See Comments)    Reaction to latex gloves    Social History   Socioeconomic History   Marital status: Single    Spouse name: Not on file   Number of children: Not on file   Years of education: Not on file   Highest education level: Not on file  Occupational History   Not on file  Tobacco Use   Smoking status: Never   Smokeless tobacco: Never  Substance and Sexual Activity   Alcohol use: Yes    Alcohol/week: 4.0 standard drinks of  alcohol    Types: 4 Standard drinks or equivalent per week    Comment: liquor - pt states once a week   Drug use: No   Sexual activity: Not on file  Other Topics Concern   Not on file  Social History Narrative   Not on file   Social Determinants of Health   Financial Resource Strain: Not on file  Food Insecurity: Not on file  Transportation Needs: Not on file  Physical Activity: Not on file  Stress: Not on file  Social Connections: Not on file  Intimate Partner Violence: Not on file    Family History  Problem Relation Age of Onset   Diabetes Mother    Cancer Father        colon   Cancer Maternal Grandmother    Cancer Maternal Grandfather     Past Surgical History:  Procedure Laterality Date   balloon stenting Right    right leg   VEIN LIGATION AND STRIPPING      ROS: Review of Systems Negative except as stated above  PHYSICAL EXAM: There were no vitals taken for this visit.  Physical Exam  {male adult master:310786} {male adult master:310785}     Latest Ref Rng & Units 12/21/2021   11:20 AM 09/21/2021   11:30 AM 01/05/2021    3:01 PM  CMP  Glucose 70 - 99 mg/dL 322  567  209   BUN 6 - 24 mg/dL 12  15  12    Creatinine 0.76 - 1.27 mg/dL  1.98  0.22   Sodium 134 - 144 mmol/L 140  139  141   Potassium 3.5 - 5.2 mmol/L 4.2  3.7  4.4   Chloride 96 - 106 mmol/L 98  101  101   CO2 20 - 29 mmol/L 27   24   Calcium 8.7 - 10.2 mg/dL 9.5   9.1   Total Protein 6.0 - 8.5 g/dL 7.7     Total Bilirubin 0.0 - 1.2 mg/dL 0.5     Alkaline Phos 44 - 121 IU/L 62     AST 0 - 40 IU/L 15     ALT 0 - 44 IU/L 9      Lipid Panel     Component Value Date/Time   CHOL 174 06/23/2019 1750   TRIG 122 06/23/2019 1750   HDL 41 06/23/2019 1750   CHOLHDL 4.2 06/23/2019 1750   LDLCALC 111 (H) 06/23/2019 1750    CBC    Component Value Date/Time   WBC 8.4 10/30/2020 1008   WBC 8.1 03/22/2016 0510   RBC 6.01 (H) 10/30/2020 1008   RBC 4.42 03/22/2016 0510   HGB 14.3  09/21/2021 1130   HGB 14.7 10/30/2020 1008   HCT 42.0 09/21/2021  1130   HCT 45.2 10/30/2020 1008   PLT 245 10/30/2020 1008   MCV 75 (L) 10/30/2020 1008   MCH 24.5 (L) 10/30/2020 1008   MCH 24.2 (L) 03/22/2016 0510   MCHC 32.5 10/30/2020 1008   MCHC 31.9 03/22/2016 0510   RDW 15.9 (H) 10/30/2020 1008   LYMPHSABS 1.8 06/23/2019 1750   MONOABS 0.8 03/20/2016 0556   EOSABS 0.2 06/23/2019 1750   BASOSABS 0.1 06/23/2019 1750    ASSESSMENT AND PLAN:  There are no diagnoses linked to this encounter.   Patient was given the opportunity to ask questions.  Patient verbalized understanding of the plan and was able to repeat key elements of the plan. Patient was given clear instructions to go to Emergency Department or return to medical center if symptoms don't improve, worsen, or new problems develop.The patient verbalized understanding.   No orders of the defined types were placed in this encounter.    Requested Prescriptions    No prescriptions requested or ordered in this encounter    No follow-ups on file.  Rema Fendt, NP

## 2022-02-05 ENCOUNTER — Ambulatory Visit: Payer: BC Managed Care – PPO | Admitting: Family Medicine

## 2022-02-05 ENCOUNTER — Encounter: Payer: Self-pay | Admitting: Family Medicine

## 2022-02-05 VITALS — BP 108/68 | HR 89 | Temp 98.1°F | Resp 18 | Wt >= 6400 oz

## 2022-02-05 DIAGNOSIS — Z794 Long term (current) use of insulin: Secondary | ICD-10-CM | POA: Diagnosis not present

## 2022-02-05 DIAGNOSIS — L03115 Cellulitis of right lower limb: Secondary | ICD-10-CM

## 2022-02-05 DIAGNOSIS — L02415 Cutaneous abscess of right lower limb: Secondary | ICD-10-CM

## 2022-02-05 DIAGNOSIS — I10 Essential (primary) hypertension: Secondary | ICD-10-CM

## 2022-02-05 DIAGNOSIS — E11622 Type 2 diabetes mellitus with other skin ulcer: Secondary | ICD-10-CM

## 2022-02-05 DIAGNOSIS — M76822 Posterior tibial tendinitis, left leg: Secondary | ICD-10-CM | POA: Insufficient documentation

## 2022-02-05 DIAGNOSIS — Z6841 Body Mass Index (BMI) 40.0 and over, adult: Secondary | ICD-10-CM

## 2022-02-05 MED ORDER — CARVEDILOL 25 MG PO TABS: 25.0000 mg | ORAL_TABLET | Freq: Two times a day (BID) | ORAL | 3 refills | Status: DC

## 2022-02-05 MED ORDER — SEMAGLUTIDE(0.25 OR 0.5MG/DOS) 2 MG/3ML ~~LOC~~ SOPN
PEN_INJECTOR | SUBCUTANEOUS | 0 refills | Status: DC
Start: 2022-02-05 — End: 2022-03-12

## 2022-02-05 NOTE — Progress Notes (Unsigned)
Patient is here for their 1 month follow-up DM/HTN Patient has no concerns today Care gaps have been discussed with patient

## 2022-02-06 ENCOUNTER — Encounter: Payer: Self-pay | Admitting: Family Medicine

## 2022-02-06 LAB — MICROALBUMIN / CREATININE URINE RATIO
Creatinine, Urine: 50.2 mg/dL
Microalb/Creat Ratio: 436 mg/g creat — ABNORMAL HIGH (ref 0–29)
Microalbumin, Urine: 219.1 ug/mL

## 2022-02-06 MED ORDER — BLOOD GLUCOSE MONITOR KIT: PACK | 0 refills | Status: DC

## 2022-02-06 NOTE — Progress Notes (Signed)
Established Patient Office Visit  Subjective    Patient ID: [name], male    DOB: Dec 11, 1980  Age: 41 y.o. MRN: [id number]  CC:  Chief Complaint  Patient presents with   Follow-up    HPI ANAS REISTER presents for follow up of chronic med issues. Patient also reports that he has a wound on his right lower leg.    Outpatient Encounter Medications as of 02/05/2022  Medication Sig   Accu-Chek Softclix Lancets lancets Use as instructed   EPINEPHrine 0.3 mg/0.3 mL IJ SOAJ injection Inject 0.3 mg into the muscle as needed for anaphylaxis.   furosemide (LASIX) 80 MG tablet Take 1 tablet (80 mg total) by mouth 2 (two) times daily.   gabapentin (NEURONTIN) 300 MG capsule TAKE 1 CAPSULE BY MOUTH EVERYDAY AT BEDTIME (Patient taking differently: Take 300 mg by mouth at bedtime.)   glucose blood (ACCU-CHEK AVIVA PLUS) test strip Use as instructed   hydrALAZINE (APRESOLINE) 50 MG tablet Take 1 tablet (50 mg total) by mouth in the morning and at bedtime.   metFORMIN (GLUCOPHAGE) 500 MG tablet Take 1 tablet (500 mg total) by mouth daily with breakfast.   NONFORMULARY OR COMPOUNDED ITEM Apply 1 spray topically See admin instructions. SPC Gentamycin (Sulf-Vancomycin-Cipro 40-20-20%) Mix 1 scoop (500 mg) of compounded powder and 15 ml's of saline; spray onto affected area(s) once daily or with dressing changes. Discard after 24 hours.   Semaglutide, 1 MG/DOSE, (OZEMPIC, 1 MG/DOSE,) 4 MG/3ML SOPN 1mg  injection weekly for 4 weeks   spironolactone (ALDACTONE) 25 MG tablet Take 1 tablet (25 mg total) by mouth daily.   traMADol (ULTRAM) 50 MG tablet Take 50 mg by mouth every 6 (six) hours as needed for moderate pain.   [DISCONTINUED] carvedilol (COREG) 25 MG tablet Take 1 tablet (25 mg total) by mouth 2 (two) times daily.   [DISCONTINUED] Semaglutide,0.25 or 0.5MG /DOS, 2 MG/3ML SOPN Inject 0.25 mg into the skin once a week for 30 days, THEN 0.5 mg once a week.   carvedilol (COREG) 25 MG tablet Take 1  tablet (25 mg total) by mouth 2 (two) times daily.   Semaglutide,0.25 or 0.5MG /DOS, 2 MG/3ML SOPN Inject 0.25 mg into the skin once a week for 30 days, THEN 0.5 mg once a week.   No facility-administered encounter medications on file as of 02/05/2022.    Past Medical History:  Diagnosis Date   Chronic venous insufficiency    a. as a premature infant, required venous access for care with subsequent DVT and some sort of procedure -> eventually went on to have vein bypass around 2005.   Hypertension    Lymphedema    Morbid obesity (HCC)    OSA on CPAP     Past Surgical History:  Procedure Laterality Date   balloon stenting Right    right leg   VEIN LIGATION AND STRIPPING      Family History  Problem Relation Age of Onset   Diabetes Mother    Cancer Father        colon   Cancer Maternal Grandmother    Cancer Maternal Grandfather     Social History   Socioeconomic History   Marital status: Single    Spouse name: Not on file   Number of children: Not on file   Years of education: Not on file   Highest education level: Not on file  Occupational History   Not on file  Tobacco Use   Smoking status: Never  Smokeless tobacco: Never  Substance and Sexual Activity   Alcohol use: Yes    Alcohol/week: 4.0 standard drinks of alcohol    Types: 4 Standard drinks or equivalent per week    Comment: liquor - pt states once a week   Drug use: No   Sexual activity: Not on file  Other Topics Concern   Not on file  Social History Narrative   Not on file   Social Determinants of Health   Financial Resource Strain: Not on file  Food Insecurity: Not on file  Transportation Needs: Not on file  Physical Activity: Not on file  Stress: Not on file  Social Connections: Not on file  Intimate Partner Violence: Not on file    Review of Systems  All other systems reviewed and are negative.       Objective    BP 108/68   Pulse 89   Temp 98.1 F (36.7 C) (Oral)   Resp 18    Wt (!) 524 lb 3.2 oz (237.8 kg)   SpO2 95%   BMI 67.30 kg/m   Physical Exam Vitals and nursing note reviewed.  Constitutional:      General: He is not in acute distress.    Appearance: He is obese.  Cardiovascular:     Rate and Rhythm: Normal rate and regular rhythm.  Pulmonary:     Effort: Pulmonary effort is normal.     Breath sounds: Normal breath sounds.  Abdominal:     Palpations: Abdomen is soft.     Tenderness: There is no abdominal tenderness.  Skin:    Comments: Right anterior lower leg with wound with purulent drainage noted  Neurological:     General: No focal deficit present.     Mental Status: He is alert and oriented to person, place, and time.         Assessment & Plan:   1. Type 2 diabetes mellitus with other skin ulcer, with Benally-term current use of insulin (HCC) Recent A1c at goal. continue - Microalbumin / creatinine urine ratio - POCT Glucose (Device for Home Use)  2. Essential hypertension Appears stable. continue - carvedilol (COREG) 25 MG tablet; Take 1 tablet (25 mg total) by mouth 2 (two) times daily.  Dispense: 180 tablet; Refill: 3  3. Cellulitis and abscess of right leg Culture obtained - referral to wound management for further eval/mgt - WOUND CULTURE  4. Class 3 severe obesity due to excess calories with serious comorbidity and body mass index (BMI) of 60.0 to 69.9 in adult [facility])     Return in about 3 months (around 05/08/2022) for follow up.   Becky Sax, MD

## 2022-02-07 LAB — WOUND CULTURE

## 2022-02-08 ENCOUNTER — Telehealth: Payer: Self-pay | Admitting: *Deleted

## 2022-02-08 ENCOUNTER — Other Ambulatory Visit: Payer: Self-pay | Admitting: Family Medicine

## 2022-02-08 ENCOUNTER — Ambulatory Visit: Payer: BC Managed Care – PPO | Attending: Family Medicine

## 2022-02-08 DIAGNOSIS — T148XXA Other injury of unspecified body region, initial encounter: Secondary | ICD-10-CM | POA: Diagnosis not present

## 2022-02-08 DIAGNOSIS — L089 Local infection of the skin and subcutaneous tissue, unspecified: Secondary | ICD-10-CM | POA: Diagnosis not present

## 2022-02-08 NOTE — Telephone Encounter (Signed)
Patient was called to come in  and do another culture swab. Per lab,  the swab used was incorrect. Patient will come  in at 3:00 on 02/08/2022

## 2022-02-12 LAB — WOUND CULTURE

## 2022-02-15 ENCOUNTER — Encounter (HOSPITAL_BASED_OUTPATIENT_CLINIC_OR_DEPARTMENT_OTHER): Payer: Self-pay | Admitting: Family

## 2022-02-15 ENCOUNTER — Ambulatory Visit (INDEPENDENT_AMBULATORY_CARE_PROVIDER_SITE_OTHER): Payer: BC Managed Care – PPO | Admitting: Family

## 2022-02-15 VITALS — BP 166/88 | HR 84 | Ht 74.0 in | Wt >= 6400 oz

## 2022-02-15 DIAGNOSIS — G4733 Obstructive sleep apnea (adult) (pediatric): Secondary | ICD-10-CM

## 2022-02-15 DIAGNOSIS — I5042 Chronic combined systolic (congestive) and diastolic (congestive) heart failure: Secondary | ICD-10-CM

## 2022-02-15 DIAGNOSIS — I10 Essential (primary) hypertension: Secondary | ICD-10-CM

## 2022-02-15 NOTE — Patient Instructions (Addendum)
Medication Instructions:   TODAY take your Hydralazine, Furosemide, Carvedilol, Spironolactone when you get home.  Take you Carvedilol, Hydralazine again this evening right before you go to sleep.  Tomorrow return to your normal medication schedule.   *If you need a refill on your cardiac medications before your next appointment, please call your pharmacy*   Lab Work/Testing/Procedures: Your physician has requested that you have a carotid duplex. This test is an ultrasound of the carotid arteries in your neck. It looks at blood flow through these arteries that supply the brain with blood. Allow one hour for this exam. There are no restrictions or special instructions.    Follow-Up: At [facility], you and your health needs are our priority.  As part of our continuing mission to provide you with exceptional heart care, we have created designated Provider Care Teams.  These Care Teams include your primary Cardiologist (physician) and Advanced Practice Providers (APPs -  Physician Assistants and Nurse Practitioners) who all work together to provide you with the care you need, when you need it.  We recommend signing up for the patient portal called "MyChart".  Sign up information is provided on this After Visit Summary.  MyChart is used to connect with patients for Virtual Visits (Telemedicine).  Patients are able to view lab/test results, encounter notes, upcoming appointments, etc.  Non-urgent messages can be sent to your provider as well.   To learn more about what you can do with MyChart, go to [email or web address].    Your next appointment:   3-4 month(s)  The format for your next appointment:   In Person  Provider:   Chilton Si, MD or [name], NP    Other Instructions  Tips to Measure your Blood Pressure Correctly  Here's what you can do to ensure a correct reading:  Don't drink a caffeinated beverage or smoke during the 30 minutes before the test.  Sit  quietly for five minutes before the test begins.  During the measurement, sit in a chair with your feet on the floor and your arm supported so your elbow is at about heart level.  The inflatable part of the cuff should completely cover at least 80% of your upper arm, and the cuff should be placed on bare skin, not over a shirt.  Don't talk during the measurement.   Blood pressure categories  Blood pressure category SYSTOLIC (upper number)  DIASTOLIC (lower number)  Normal Less than 120 mm Hg and Less than 80 mm Hg  Elevated 120-129 mm Hg and Less than 80 mm Hg  High blood pressure: Stage 1 hypertension 130-139 mm Hg or 80-89 mm Hg  High blood pressure: Stage 2 hypertension 140 mm Hg or higher or 90 mm Hg or higher  Hypertensive crisis (consult your doctor immediately) Higher than 180 mm Hg and/or Higher than 120 mm Hg  Source: American Heart Association and American Stroke Association. For more on getting your blood pressure under control, buy Controlling Your Blood Pressure, a Special Health Report from [facility]   Blood Pressure Log   Date   Time  Blood Pressure  Example: Nov 1 9 AM 124/78

## 2022-02-15 NOTE — Progress Notes (Signed)
Office Visit    Patient Name: [name] Date of Encounter: 02/15/2022  PCP:  Dorna Mai, [place]  Cardiologist:  Skeet Latch, MD  Advanced Practice Provider:  No care team member to display Electrophysiologist:  None      Chief Complaint    [name] is a 41 y.o. male presents today for heart failure follow up.   Past Medical History    Past Medical History:  Diagnosis Date   Chronic venous insufficiency    a. as a premature infant, required venous access for care with subsequent DVT and some sort of procedure -> eventually went on to have vein bypass around 2005.   Hypertension    Lymphedema    Morbid obesity ([place])    OSA on CPAP    Past Surgical History:  Procedure Laterality Date   balloon stenting Right    right leg   VEIN LIGATION AND STRIPPING      Allergies  Allergies  Allergen Reactions   Entresto [Sacubitril-Valsartan] Swelling and Other (See Comments)    Tongue swelling   Lidocaine Hives, Shortness Of Breath and Other (See Comments)    "burns my skin" also- Patient ended up in the ED   Other Anaphylaxis, Swelling and Other (See Comments)    Tree nuts cause swelling   Peanut-Containing Drug Products Anaphylaxis and Swelling   Hydrofera Blue 4"X4" [Wound Dressings] Nausea Only and Other (See Comments)    Made the patient light-headed and caused sweating also   Latex Hives and Other (See Comments)    Reaction to latex gloves    History of Present Illness    [name] is a 41 y.o. male with a hx of hx of chronic heart failure, hypertension, hyperlipidemia, diabetes, morbid obesity, OSA, venous insufficiency last seen 12/14/2021   He was admitted 03/20/16-04/05/16 for acute heart failure. Echocardiogram was not interpretable due to body habitus. He was diuresed with IV Lasix with negative output of 47.2 L. H e was discharged from 631 pounds on admission to 488 pounds on discharge. His creatnine remained  stable.  Seen 04/2018 for routine follow-up and lost to follow-up until July 2022.  Evaluated 10/06/20 with worsening LE edema, dyspnea on exertion. Metolazone 2.71m one tablet thirty minutes prior to Furosemide was started on Saturdays and Tuesdays. Seen in follow up 10/30/20 with myriad of concerns including LE edema with LRE in Unna boot, hand numbness, palpitations. Due to worsening renal function Metolazone was discontinued. At clinic 12/2020 [facility] due to new insurance EDelene Lollwas cost prohibitive but was not using coupon and encouraged to resume with coupon.    Phone visit 05/2021 with sleep study ordered - diagnosed with OSA.    ED visit 08/30/2021 for angioedema after using Aveeno lotion around face and lips.  Was recommended to stop Entresto until cardiology follow-up.  Of note could not rule out allergic reaction to lotion.  Provided short course of prednisone as well as doxycycline due to RLE wound/ulcer  ED visit 09/21/2021 after allergic reaction to topical lidocaine at wound care clinic.  Evaluated in clinic by Dr. ROval Linsey7/17/2023 volume overloaded in the setting of not taking his medications.  He was to resume carvedilol, Lasix, spironolactone.  Hydralazine 25 mg twice daily added. Seen 12/14/21 and Hydralazine increased to 541mBID and Ozempic added.  Presents today for follow up. [place] [place] and has a 41 month old daughter. Had to work in the office this morning instead of at home and missed his morning medications.  Reports BP at home when he takes his medications usually 120s-130s.  He is scheduled to pick up his CPAP on Monday.  Feeling overall "okay ". Tolerating Ozempic 0.33m with only mild belching. Discussed reducing intake of carbonated beverages and eating smaller meals.  EKGs/Labs/Other Studies Reviewed:   The following studies were reviewed today: LE Venous Reflux  11/02/2020: Summary:  Right:  - No evidence of obvious acute deep vein thrombosis seen in the right  lower extremity, from the common femoral through the popliteal veins.  - No evidence of superficial venous reflux seen in the right greater  saphenous vein, from the mid to distal thigh to mid calf area. Post  ablation / stripping in the thigh area.  - No evidence of superficial venous reflux seen in the right short  saphenous vein, limited visualization.  - Venous reflux is noted in the right common femoral vein.  - No compression and no flow in varicosities at the medial knee suggestive  of a history of superficial thrombophlebitis  - Enlarged lymph node noted in groin   NOTE: This was a technically difficult and limited exam    Echo 03/21/2016: Impressions:  - Uninterpretable study, even after Definity contrast    administration.    EKG:  No EKG today.   Recent Labs: 09/21/2021: Hemoglobin 14.3 12/21/2021: ALT 9; BUN 12; Creatinine, Ser 0.98; Magnesium 2.2; Potassium 4.2; Sodium 140  Recent Lipid Panel    Component Value Date/Time   CHOL 174 06/23/2019 1750   TRIG 122 06/23/2019 1750   HDL 41 06/23/2019 1750   CHOLHDL 4.2 06/23/2019 1750   LDLCALC 111 (H) 06/23/2019 1750    Home Medications   Current Meds  Medication Sig   Accu-Chek Softclix Lancets lancets Use as instructed   blood glucose meter kit and supplies KIT Dispense based on patient and insurance preference. Use up to four times daily as directed.   carvedilol (COREG) 25 MG tablet Take 1 tablet (25 mg total) by mouth 2 (two) times daily.   EPINEPHrine 0.3 mg/0.3 mL IJ SOAJ injection Inject 0.3 mg into the muscle as needed for anaphylaxis.   furosemide (LASIX) 80 MG tablet Take 1 tablet (80 mg total) by mouth 2 (two) times daily.   gabapentin (NEURONTIN) 300 MG capsule TAKE 1 CAPSULE BY MOUTH EVERYDAY AT BEDTIME (Patient taking differently: Take 300 mg by mouth at bedtime.)   glucose blood (ACCU-CHEK AVIVA PLUS)  test strip Use as instructed   hydrALAZINE (APRESOLINE) 50 MG tablet Take 1 tablet (50 mg total) by mouth in the morning and at bedtime.   metFORMIN (GLUCOPHAGE) 500 MG tablet Take 1 tablet (500 mg total) by mouth daily with breakfast.   Semaglutide, 1 MG/DOSE, (OZEMPIC, 1 MG/DOSE,) 4 MG/3ML SOPN 118minjection weekly for 4 weeks   Semaglutide,0.25 or 0.5MG/DOS, 2 MG/3ML SOPN Inject 0.25 mg into the skin once a week for 30 days, THEN 0.5 mg once a week.   spironolactone (ALDACTONE) 25 MG tablet Take 1 tablet (25 mg total) by mouth daily.   traMADol (ULTRAM) 50 MG tablet Take 50 mg by mouth every 6 (six) hours as needed for moderate pain.   [DISCONTINUED] NONFORMULARY OR COMPOUNDED ITEM Apply 1 spray topically See admin instructions. SPC Gentamycin (Sulf-Vancomycin-Cipro 40-20-20%) Mix 1 scoop (500 mg) of compounded powder and 15 ml's of saline; spray  onto affected area(s) once daily or with dressing changes. Discard after 24 hours.     Review of Systems      All other systems reviewed and are otherwise negative except as noted above.  Physical Exam    VS:  BP (!) 162/94 (BP Location: Right Arm, Patient Position: Sitting, Cuff Size: Large)   Pulse 84   Ht 6' 2" (1.88 m)   Wt (!) 524 lb 14.4 oz (238.1 kg)   SpO2 96%   BMI 67.39 kg/m  , BMI Body mass index is 67.39 kg/m.  Wt Readings from Last 3 Encounters:  02/15/22 (!) 524 lb 14.4 oz (238.1 kg)  02/05/22 (!) 524 lb 3.2 oz (237.8 kg)  12/14/21 (!) 516 lb 9.6 oz (234.3 kg)    GEN: Well nourished, overweight, well developed, in no acute distress. HEENT: normal. Neck: Supple, no JVD, carotid bruits, or masses. Cardiac: RRR, no murmurs, rubs, or gallops. No clubbing, cyanosis.   Radials/PT 2+ and equal bilaterally.  Respiratory:  Respirations regular and unlabored, clear to auscultation bilaterally. GI: Soft, nontender, nondistended. MS: No deformity or atrophy. Skin: Warm and dry, no rash. RLE with compression sock on with wounds  followed by Wound Care.  Neuro:  Strength and sensation are intact. Psych: Normal affect.  Assessment & Plan    Chronic congestive heart failure-unknown systolic or diastolic.  Echo not very interpretable due to body habitus.  No ACR/ARB/ARNI due to previous angioedema. Consider SLG2i at follow up. Continue Carvedilol 14m BID, lasix 839mBID, Spironolactone 2557mD, Hydralazine 70m70mD. Low sodium diet, fluid restriction <2L, and daily weights encouraged. Educated to contact our office for weight gain of 2 lbs overnight or 5 lbs in one week.   HTN - BP not at goal <130/80 in setting of not taking medications. Continue current medication regimen and check in via MyChart in one week. Reports BP at goal at home. BP asymmetric >10 points between arms - carotid duplex to rule out aortic coarctation.  OSA- CPAP compliance encouraged. Plans to pick up Monday.   Morbid obesity / BMI 66 - Weight loss via diet and exercise encouraged. Discussed the impact being overweight would have on cardiovascular risk. Continue Ozempic. Previously referred to [facility] and encouraged to call and schedule appointment.     Disposition: Follow up in 3 month(s) with TiffSkeet Latch or APP.  Signed, CaitLoel Dubonnet 02/15/2022, 3:11 PM [facility]

## 2022-02-18 ENCOUNTER — Other Ambulatory Visit: Payer: Self-pay | Admitting: Family Medicine

## 2022-02-18 MED ORDER — SULFAMETHOXAZOLE-TRIMETHOPRIM 800-160 MG PO TABS: 1.0000 | ORAL_TABLET | Freq: Two times a day (BID) | ORAL | 0 refills | Status: DC

## 2022-02-18 NOTE — Progress Notes (Signed)
Bactrim

## 2022-02-21 ENCOUNTER — Encounter (HOSPITAL_BASED_OUTPATIENT_CLINIC_OR_DEPARTMENT_OTHER): Payer: Self-pay

## 2022-03-11 ENCOUNTER — Other Ambulatory Visit: Payer: Self-pay | Admitting: Family

## 2022-03-11 ENCOUNTER — Other Ambulatory Visit: Payer: Self-pay | Admitting: Family Medicine

## 2022-03-11 DIAGNOSIS — E119 Type 2 diabetes mellitus without complications: Secondary | ICD-10-CM

## 2022-03-12 ENCOUNTER — Other Ambulatory Visit: Payer: Self-pay | Admitting: Pharmacist

## 2022-03-12 DIAGNOSIS — E119 Type 2 diabetes mellitus without complications: Secondary | ICD-10-CM

## 2022-03-12 MED ORDER — BLOOD GLUCOSE MONITOR KIT: PACK | 0 refills | Status: DC

## 2022-03-12 MED ORDER — OZEMPIC (1 MG/DOSE) 4 MG/3ML ~~LOC~~ SOPN: PEN_INJECTOR | SUBCUTANEOUS | 0 refills | Status: DC

## 2022-03-12 NOTE — Telephone Encounter (Signed)
From: Eric Form To: Office of Loel Dubonnet, NP Sent: 03/12/2022 12:02 PM EST Subject: Medication Renewal Request  Refills have been requested for the following medications:   Semaglutide, 1 MG/DOSE, (OZEMPIC, 1 MG/DOSE,) 4 MG/3ML SOPN [Caitlin S Walker]  Preferred pharmacy: [id number] - [place], [place]  This message is being sent by [name] on behalf of [name]

## 2022-03-13 ENCOUNTER — Ambulatory Visit (INDEPENDENT_AMBULATORY_CARE_PROVIDER_SITE_OTHER): Payer: BC Managed Care – PPO

## 2022-03-13 DIAGNOSIS — I10 Essential (primary) hypertension: Secondary | ICD-10-CM | POA: Diagnosis not present

## 2022-03-13 DIAGNOSIS — I5042 Chronic combined systolic (congestive) and diastolic (congestive) heart failure: Secondary | ICD-10-CM

## 2022-03-14 MED ORDER — GABAPENTIN 300 MG PO CAPS: 300.0000 mg | ORAL_CAPSULE | Freq: Three times a day (TID) | ORAL | 1 refills | Status: DC

## 2022-03-14 MED ORDER — METFORMIN HCL 500 MG PO TABS: 500.0000 mg | ORAL_TABLET | Freq: Every day | ORAL | 0 refills | Status: DC

## 2022-03-15 MED ORDER — TRAMADOL HCL 50 MG PO TABS: 50.0000 mg | ORAL_TABLET | Freq: Four times a day (QID) | ORAL | 0 refills | Status: DC | PRN

## 2022-03-19 ENCOUNTER — Encounter: Payer: BC Managed Care – PPO | Admitting: Bariatrics

## 2022-03-19 ENCOUNTER — Encounter: Payer: Self-pay | Admitting: Bariatrics

## 2022-03-19 NOTE — Telephone Encounter (Signed)
Patient has been rescheduled.

## 2022-03-26 ENCOUNTER — Encounter: Payer: BC Managed Care – PPO | Admitting: Bariatrics

## 2022-03-28 ENCOUNTER — Encounter: Payer: BC Managed Care – PPO | Admitting: Bariatrics

## 2022-03-31 ENCOUNTER — Other Ambulatory Visit: Payer: Self-pay | Admitting: Family Medicine

## 2022-04-03 ENCOUNTER — Encounter (HOSPITAL_BASED_OUTPATIENT_CLINIC_OR_DEPARTMENT_OTHER): Payer: Self-pay

## 2022-04-03 DIAGNOSIS — I10 Essential (primary) hypertension: Secondary | ICD-10-CM

## 2022-04-03 DIAGNOSIS — I5042 Chronic combined systolic (congestive) and diastolic (congestive) heart failure: Secondary | ICD-10-CM

## 2022-04-04 ENCOUNTER — Other Ambulatory Visit: Payer: Self-pay | Admitting: Pharmacist

## 2022-04-04 ENCOUNTER — Other Ambulatory Visit (HOSPITAL_COMMUNITY): Payer: Self-pay

## 2022-04-04 ENCOUNTER — Other Ambulatory Visit: Payer: Self-pay

## 2022-04-04 DIAGNOSIS — E119 Type 2 diabetes mellitus without complications: Secondary | ICD-10-CM

## 2022-04-04 MED ORDER — FUROSEMIDE 80 MG PO TABS: 80.0000 mg | ORAL_TABLET | Freq: Two times a day (BID) | ORAL | 3 refills | Status: DC

## 2022-04-04 MED ORDER — OZEMPIC (1 MG/DOSE) 4 MG/3ML ~~LOC~~ SOPN
1.0000 mg | PEN_INJECTOR | SUBCUTANEOUS | 0 refills | Status: AC
  Filled 2022-04-04: qty 3, 28d supply, fill #0

## 2022-04-04 NOTE — Telephone Encounter (Signed)
From: Eric Form To: Office of Loel Dubonnet, NP Sent: 04/03/2022 10:27 PM EST Subject: Medication Renewal Request  Refills have been requested for the following medications:   Semaglutide, 1 MG/DOSE, (OZEMPIC, 1 MG/DOSE,) 4 MG/3ML SOPN [Caitlin S Walker]  Preferred pharmacy: [place] Delivery method: Mail

## 2022-04-08 ENCOUNTER — Other Ambulatory Visit (HOSPITAL_COMMUNITY): Payer: Self-pay

## 2022-04-11 ENCOUNTER — Encounter: Payer: Self-pay | Admitting: Family Medicine

## 2022-04-11 DIAGNOSIS — E11622 Type 2 diabetes mellitus with other skin ulcer: Secondary | ICD-10-CM

## 2022-04-15 MED ORDER — ACCU-CHEK GUIDE VI STRP: ORAL_STRIP | 12 refills | Status: AC

## 2022-04-15 MED ORDER — ACCU-CHEK SOFTCLIX LANCETS MISC: 12 refills | Status: AC

## 2022-04-15 NOTE — Telephone Encounter (Signed)
Medication has been order

## 2022-05-09 ENCOUNTER — Ambulatory Visit: Payer: BC Managed Care – PPO | Admitting: Family Medicine

## 2022-05-09 ENCOUNTER — Encounter: Payer: Self-pay | Admitting: Family Medicine

## 2022-05-09 VITALS — BP 151/100 | HR 79 | Temp 98.1°F | Resp 18 | Wt >= 6400 oz

## 2022-05-09 DIAGNOSIS — E11622 Type 2 diabetes mellitus with other skin ulcer: Secondary | ICD-10-CM | POA: Diagnosis not present

## 2022-05-09 DIAGNOSIS — Z794 Long term (current) use of insulin: Secondary | ICD-10-CM | POA: Diagnosis not present

## 2022-05-09 DIAGNOSIS — Z6841 Body Mass Index (BMI) 40.0 and over, adult: Secondary | ICD-10-CM | POA: Diagnosis not present

## 2022-05-09 DIAGNOSIS — I10 Essential (primary) hypertension: Secondary | ICD-10-CM | POA: Diagnosis not present

## 2022-05-09 LAB — POCT GLYCOSYLATED HEMOGLOBIN (HGB A1C): Hemoglobin A1C: 6.3 % — AB (ref 4.0–5.6)

## 2022-05-10 NOTE — Progress Notes (Signed)
Established Patient Office Visit  Subjective    Patient ID: [name], male    DOB: Jul 25, 1980  Age: 42 y.o. MRN: [id number]  CC:  Chief Complaint  Patient presents with   Follow-up   Diabetes    HPI [name] presents for routine follow up of chronic med issues. Patient denies acute complaints or concerns.    Outpatient Encounter Medications as of 05/09/2022  Medication Sig   Accu-Chek Softclix Lancets lancets Use as instructed   Blood Glucose Monitoring Suppl (ACCU-CHEK GUIDE ME) w/Device KIT USE UP TO 4 TIMES A DAY AS DIRECTED   carvedilol (COREG) 25 MG tablet Take 1 tablet (25 mg total) by mouth 2 (two) times daily.   EPINEPHrine 0.3 mg/0.3 mL IJ SOAJ injection Inject 0.3 mg into the muscle as needed for anaphylaxis.   furosemide (LASIX) 80 MG tablet Take 1 tablet (80 mg total) by mouth 2 (two) times daily.   gabapentin (NEURONTIN) 300 MG capsule Take 1 capsule (300 mg total) by mouth 3 (three) times daily.   glucose blood (ACCU-CHEK GUIDE) test strip Use as instructed   hydrALAZINE (APRESOLINE) 50 MG tablet Take 1 tablet (50 mg total) by mouth in the morning and at bedtime.   metFORMIN (GLUCOPHAGE) 500 MG tablet Take 1 tablet (500 mg total) by mouth daily with breakfast.   spironolactone (ALDACTONE) 25 MG tablet Take 1 tablet (25 mg total) by mouth daily.   sulfamethoxazole-trimethoprim (BACTRIM DS) 800-160 MG tablet Take 1 tablet by mouth 2 (two) times daily.   traMADol (ULTRAM) 50 MG tablet Take 1 tablet (50 mg total) by mouth every 6 (six) hours as needed for moderate pain.   No facility-administered encounter medications on file as of 05/09/2022.    Past Medical History:  Diagnosis Date   Chronic venous insufficiency    a. as a premature infant, required venous access for care with subsequent DVT and some sort of procedure -> eventually went on to have vein bypass around 2005.   Hypertension    Lymphedema    Morbid obesity (HCC)    OSA on CPAP     Past  Surgical History:  Procedure Laterality Date   balloon stenting Right    right leg   VEIN LIGATION AND STRIPPING      Family History  Problem Relation Age of Onset   Diabetes Mother    Cancer Father        colon   Cancer Maternal Grandmother    Cancer Maternal Grandfather     Social History   Socioeconomic History   Marital status: Single    Spouse name: Not on file   Number of children: Not on file   Years of education: Not on file   Highest education level: Not on file  Occupational History   Not on file  Tobacco Use   Smoking status: Never   Smokeless tobacco: Never  Substance and Sexual Activity   Alcohol use: Yes    Alcohol/week: 4.0 standard drinks of alcohol    Types: 4 Standard drinks or equivalent per week    Comment: liquor - pt states once a week   Drug use: No   Sexual activity: Not on file  Other Topics Concern   Not on file  Social History Narrative   Not on file   Social Determinants of Health   Financial Resource Strain: Not on file  Food Insecurity: Not on file  Transportation Needs: Not on file  Physical Activity:  Not on file  Stress: Not on file  Social Connections: Not on file  Intimate Partner Violence: Not on file    Review of Systems  All other systems reviewed and are negative.       Objective    BP (!) 151/100   Pulse 79   Temp 98.1 F (36.7 C) (Oral)   Resp 18   Wt (!) 500 lb (226.8 kg)   SpO2 97%   BMI 64.20 kg/m   Physical Exam Vitals and nursing note reviewed.  Constitutional:      General: He is not in acute distress.    Appearance: He is obese.  Cardiovascular:     Rate and Rhythm: Normal rate and regular rhythm.  Pulmonary:     Effort: Pulmonary effort is normal.     Breath sounds: Normal breath sounds.  Abdominal:     Palpations: Abdomen is soft.     Tenderness: There is no abdominal tenderness.  Musculoskeletal:     Right lower leg: Edema present.     Left lower leg: Edema present.  Neurological:      General: No focal deficit present.     Mental Status: He is alert and oriented to person, place, and time.         Assessment & Plan:   1. Type 2 diabetes mellitus with other skin ulcer, with Darko-term current use of insulin (HCC) Improved A1c and continues at goal. continue - HM DIABETES FOOT EXAM - POCT glycosylated hemoglobin (Hb A1C)  2. Essential hypertension Elevated readings. Discussed compliance.   3. Class 3 severe obesity due to excess calories with serious comorbidity and body mass index (BMI) of 60.0 to 69.9 in adult [facility]) Discussed dietary and activity options. Goal is 4-6lbs/mo wt loss.     Return in about 6 months (around 11/07/2022) for follow up.   Becky Sax, MD

## 2022-06-27 DIAGNOSIS — G4733 Obstructive sleep apnea (adult) (pediatric): Secondary | ICD-10-CM | POA: Diagnosis not present

## 2022-07-08 ENCOUNTER — Encounter (HOSPITAL_BASED_OUTPATIENT_CLINIC_OR_DEPARTMENT_OTHER): Payer: Self-pay | Admitting: Cardiovascular Disease

## 2022-07-08 ENCOUNTER — Ambulatory Visit (HOSPITAL_BASED_OUTPATIENT_CLINIC_OR_DEPARTMENT_OTHER): Payer: BC Managed Care – PPO | Admitting: Cardiovascular Disease

## 2022-07-08 VITALS — BP 146/80 | HR 97 | Ht 74.0 in | Wt >= 6400 oz

## 2022-07-08 DIAGNOSIS — I509 Heart failure, unspecified: Secondary | ICD-10-CM | POA: Diagnosis not present

## 2022-07-08 DIAGNOSIS — Z5181 Encounter for therapeutic drug level monitoring: Secondary | ICD-10-CM

## 2022-07-08 DIAGNOSIS — E785 Hyperlipidemia, unspecified: Secondary | ICD-10-CM | POA: Diagnosis not present

## 2022-07-08 DIAGNOSIS — G4733 Obstructive sleep apnea (adult) (pediatric): Secondary | ICD-10-CM

## 2022-07-08 DIAGNOSIS — I10 Essential (primary) hypertension: Secondary | ICD-10-CM | POA: Diagnosis not present

## 2022-07-08 NOTE — Assessment & Plan Note (Signed)
Continue CPAP.  

## 2022-07-08 NOTE — Patient Instructions (Signed)
Medication Instructions:  Your physician recommends that you continue on your current medications as directed. Please refer to the Current Medication list given to you today.   *If you need a refill on your cardiac medications before your next appointment, please call your pharmacy*  Lab Work: FASTING LP/CMET/LPa SOON   If you have labs (blood work) drawn today and your tests are completely normal, you will receive your results only by: MyChart Message (if you have MyChart) OR A paper copy in the mail If you have any lab test that is abnormal or we need to change your treatment, we will call you to review the results.  Testing/Procedures: NONE  Follow-Up: At [facility], you and your health needs are our priority.  As part of our continuing mission to provide you with exceptional heart care, we have created designated Provider Care Teams.  These Care Teams include your primary Cardiologist (physician) and Advanced Practice Providers (APPs -  Physician Assistants and Nurse Practitioners) who all work together to provide you with the care you need, when you need it.  We recommend signing up for the patient portal called "MyChart".  Sign up information is provided on this After Visit Summary.  MyChart is used to connect with patients for Virtual Visits (Telemedicine).  Patients are able to view lab/test results, encounter notes, upcoming appointments, etc.  Non-urgent messages can be sent to your provider as well.   To learn more about what you can do with MyChart, go to [email or web address].    Your next appointment:   4 month(s)  Provider:   Chilton Si, MD or [name], NP    Other Instructions 1 WEEK PRIOR TO YOUR FOLLOW UP START CHECKING YOUR BLOOD PRESSURE DAILY AND LOG. BRING LOG AND MACHINE TO FOLLOW UP

## 2022-07-08 NOTE — Progress Notes (Signed)
Cardiology Office Note  Date:  07/08/2022   ID:  [name], DOB [birth year], MRN [id number]  PCP:  Shawn Skeans, MD  Cardiologist:   Chilton Si, MD   History of Present Illness: [name] is a 42 y.o. male with chronic heart failure type unknown, hypertension, hyperlipidemia, diabetes, morbid obesity, OSA, and venous insufficiency who presents for follow-up. [name] was admitted 1/10 through 1/26 2018 for acute heart failure exacerbation. Echocardiogram was not interpretable due to body habitus. He was diuresed with IV Lasix with a negative output of 47.2 L. His weight decreased from 631 pounds on admission to 488 pounds at discharge. His creatinine remained stable.  He followed up with [name] on 04/12/16 and was doing well. His weight was 498 pounds. His blood pressure was poorly-controlled. It was noted that carvedilol had been left off his medication list at discharge. This was restarted at that appointment. He followed up with [name] and was doing well. He was exercising and working out with a Systems analyst 3 times per week. on 07/2016.  His blood pressure was slightly elevated at that appointment, but he had come straight from the gym, so no changes were made.   He was feeling well and his weight was up in the setting of going on a cruise and not taking his spironolactone. His medication was resumed. He was also referred to wound care for a non-healing leg ulcer. He last saw [name], NP 10/2020. Prior to that he was treated with metolazone and increased lasix for volume overload. He had recently gone on a cruise and felt palpitations while away. He struggled with motion sickness while on his trip. He was reminded of fluid and sodium restrictions.   At his visit 09/2021 he was very volume overloaded in the setting of not taking his medications. He was asked to resume carvedilol, Lasix, and spironolactone. [name] was previously stopped due to angioedema;  we agreed to start hydralazine 25 mg BID. He had also been in the ED 09/21/2021 due to an allergic reaction to topical lidocaine at the wound care clinic. He was seen by [name], NP 12/2021 and his home blood pressures were averaging 130s-140s. He was feeling better since resuming his medications. Hydralazine was increased to 50 mg BID and Ozempic was added. [facility]. At his follow-up 02/2022 he reported home blood pressures were at goal. He was hypertensive in the office and his blood pressure was asymmetric over 10 points between arms. He had carotid dopplers 03/2022 revealing no evidence of stenosis.  Today, he is feeling under the weather with throat discomfort and raspy voice. His daughter is a recent sick contact with ear infections; she recently started an antibiotic course. Otherwise he has been feeling alright with stable breathing. He also states that his LE swelling is not as severe today. He usually sees more swelling in his right leg than in his left. In clinic today his blood pressure is elevated to 171/105, which he attributes to feeling sick. He did take his medications today. On manual recheck, his blood pressure improved to 146/80. At home he mostly sees readings of 135-140 at home, sometimes in the high 120s/low 130s. He has been exercising a little bit, and plans to keep working on increasing his activity. He has successfully lost some weight. He denies any palpitations, chest pain, lightheadedness, headaches, syncope, orthopnea, or PND.   Past Medical History:  Diagnosis Date  Chronic venous insufficiency    a. as a premature infant, required venous access for care with subsequent DVT and some sort of procedure -> eventually went on to have vein bypass around 2005.   Hypertension    Lymphedema    Morbid obesity (HCC)    OSA on CPAP     Past Surgical History:  Procedure Laterality Date   balloon stenting Right    right leg   VEIN  LIGATION AND STRIPPING       Current Outpatient Medications  Medication Sig Dispense Refill   Accu-Chek Softclix Lancets lancets Use as instructed 100 each 12   Blood Glucose Monitoring Suppl (ACCU-CHEK GUIDE ME) w/Device KIT USE UP TO 4 TIMES A DAY AS DIRECTED 1 kit 0   carvedilol (COREG) 25 MG tablet Take 1 tablet (25 mg total) by mouth 2 (two) times daily. 180 tablet 3   EPINEPHrine 0.3 mg/0.3 mL IJ SOAJ injection Inject 0.3 mg into the muscle as needed for anaphylaxis. 1 each 0   furosemide (LASIX) 80 MG tablet Take 1 tablet (80 mg total) by mouth 2 (two) times daily. 180 tablet 3   gabapentin (NEURONTIN) 300 MG capsule Take 1 capsule (300 mg total) by mouth 3 (three) times daily. 90 capsule 1   glucose blood (ACCU-CHEK GUIDE) test strip Use as instructed 100 each 12   hydrALAZINE (APRESOLINE) 50 MG tablet Take 1 tablet (50 mg total) by mouth in the morning and at bedtime. 180 tablet 3   metFORMIN (GLUCOPHAGE) 500 MG tablet Take 1 tablet (500 mg total) by mouth daily with breakfast. 90 tablet 0   OZEMPIC, 1 MG/DOSE, 4 MG/3ML SOPN Inject 1 mg into the skin once a week.     spironolactone (ALDACTONE) 25 MG tablet Take 1 tablet (25 mg total) by mouth daily. 90 tablet 3   traMADol (ULTRAM) 50 MG tablet Take 1 tablet (50 mg total) by mouth every 6 (six) hours as needed for moderate pain. 30 tablet 0   No current facility-administered medications for this visit.    Allergies:   Entresto [sacubitril-valsartan], Lidocaine, Other, Peanut-containing drug products, Hydrofera blue 4"x4" [wound dressings], and Latex    Social History:  The patient  reports that he has never smoked. He has never used smokeless tobacco. He reports current alcohol use of about 4.0 standard drinks of alcohol per week. He reports that he does not use drugs.   Family History:  The patient's family history includes Cancer in his father, maternal grandfather, and maternal grandmother; Diabetes in his mother.    ROS:    Please see the history of present illness. (+) LE edema All other systems are reviewed and negative.    PHYSICAL EXAM: VS:  BP (!) 146/80 (BP Location: Right Arm, Patient Position: Sitting, Cuff Size: Large)   Pulse 97   Ht 6\' 2"  (1.88 m)   Wt (!) 494 lb 9.6 oz (224.3 kg)   SpO2 92%   BMI 63.50 kg/m  , BMI Body mass index is 63.5 kg/m. GENERAL:  Well appearing.  HEENT: Pupils equal round and reactive, fundi not visualized, oral mucosa unremarkable NECK:  No jugular venous distention, waveform within normal limits, carotid upstroke brisk and symmetric, no bruits LUNGS:  Clear to auscultation bilaterally HEART:  RRR.  PMI not displaced or sustained,S1 and S2 within normal limits, no S3, no S4, no clicks, no rubs, no murmurs ABD:  Flat, positive bowel sounds normal in frequency in pitch, no bruits, no rebound, no  guarding, no midline pulsatile mass, no hepatomegaly, no splenomegaly EXT:  2 plus pulses throughout, trace L LE edema.  R LE wrapped.  No cyanosis no clubbing.  SKIN:  No rashes no nodules NEURO:  Cranial nerves II through XII grossly intact, motor grossly intact throughout PSYCH:  Cognitively intact, oriented to person place and time  EKG:  EKG is personally reviewed. 07/08/2022:  EKG was not ordered. 09/24/2021 EKG: Rate 88. Sinus Rhythm. 01/05/2021: EKG was not ordered. 05/04/2018: EKG was not ordered. 10/31/2017: Sinus rhythm.  Rate 85 bpm.  Prior anteroseptal infarct.  Low voltage. 11/05/16: Sinus rhythm.  Rate 72 bpm.  Low voltage.  03/13/16: Sinus rhythm.  Rate 80 bpm.  Nonspecific T wave abnormalities.  Bilateral Carotid Doppler 03/13/2022: Summary:  Right Carotid: The extracranial vessels were near-normal with only minimal  wall                thickening or plaque.   Left Carotid: The extracranial vessels were near-normal with only minimal  wall               thickening or plaque.   Vertebrals:  Bilateral vertebral arteries demonstrate antegrade flow.   Subclavians: Normal flow hemodynamics were seen in bilateral subclavian               arteries.   LE Venous Reflux 11/02/2020: Summary:  Right:  - No evidence of obvious acute deep vein thrombosis seen in the right  lower extremity, from the common femoral through the popliteal veins.  - No evidence of superficial venous reflux seen in the right greater  saphenous vein, from the mid to distal thigh to mid calf area. Post  ablation / stripping in the thigh area.  - No evidence of superficial venous reflux seen in the right short  saphenous vein, limited visualization.  - Venous reflux is noted in the right common femoral vein.  - No compression and no flow in varicosities at the medial knee suggestive  of a history of superficial thrombophlebitis  - Enlarged lymph node noted in groin   NOTE: This was a technically difficult and limited exam   Echo 03/21/2016: Impressions:  - Uninterpretable study, even after Definity contrast    administration.   Recent Labs: 09/21/2021: Hemoglobin 14.3 12/21/2021: ALT 9; BUN 12; Creatinine, Ser 0.98; Magnesium 2.2; Potassium 4.2; Sodium 140    Lipid Panel    Component Value Date/Time   CHOL 174 06/23/2019 1750   TRIG 122 06/23/2019 1750   HDL 41 06/23/2019 1750   CHOLHDL 4.2 06/23/2019 1750   LDLCALC 111 (H) 06/23/2019 1750      Wt Readings from Last 3 Encounters:  07/08/22 (!) 494 lb 9.6 oz (224.3 kg)  05/09/22 (!) 500 lb (226.8 kg)  02/15/22 (!) 524 lb 14.4 oz (238.1 kg)      ASSESSMENT AND PLAN:  Chronic congestive heart failure (HCC) Volume status is stable.  Continue with compressive wrap on the right lower extremity.  Blood pressure was elevated in the office today.  It did improve on repeat.  Today he is not feeling well at due to a cold.  He notes that his blood pressures have been in the 120s to low 130s at home.  He is doing a good job of losing weight.  Will not make any changes to his regimen today.  For now, continue  carvedilol, hydralazine, spironolactone.  He is not on Entresto due to angioedema.  If he needs additional agents, would consider  increasing spironolactone or hydralazine.  NSVT (nonsustained ventricular tachycardia) (HCC) Continue carvedilol.  OSA (obstructive sleep apnea) Continue CPAP.  Morbid obesity (HCC) He is making steady progress with weight loss.  Continue Ozempic.  Encouraged increased exercise.  Disposition:    FU with Sanjana Folz C. Duke Salvia, MD, [facility] in 4 months.  Medication Adjustments/Labs and Tests Ordered: Current medicines are reviewed at length with the patient today.  Concerns regarding medicines are outlined above.   Orders Placed This Encounter  Procedures   Comprehensive metabolic panel   Lipid panel   Lipoprotein A (LPA)   No orders of the defined types were placed in this encounter.  Patient Instructions  Medication Instructions:  Your physician recommends that you continue on your current medications as directed. Please refer to the Current Medication list given to you today.   *If you need a refill on your cardiac medications before your next appointment, please call your pharmacy*  Lab Work: FASTING LP/CMET/LPa SOON   If you have labs (blood work) drawn today and your tests are completely normal, you will receive your results only by: MyChart Message (if you have MyChart) OR A paper copy in the mail If you have any lab test that is abnormal or we need to change your treatment, we will call you to review the results.  Testing/Procedures: NONE  Follow-Up: At [facility], you and your health needs are our priority.  As part of our continuing mission to provide you with exceptional heart care, we have created designated Provider Care Teams.  These Care Teams include your primary Cardiologist (physician) and Advanced Practice Providers (APPs -  Physician Assistants and Nurse Practitioners) who all work together to provide you with the care you  need, when you need it.  We recommend signing up for the patient portal called "MyChart".  Sign up information is provided on this After Visit Summary.  MyChart is used to connect with patients for Virtual Visits (Telemedicine).  Patients are able to view lab/test results, encounter notes, upcoming appointments, etc.  Non-urgent messages can be sent to your provider as well.   To learn more about what you can do with MyChart, go to [email or web address].    Your next appointment:   4 month(s)  Provider:   Chilton Si, MD or [name], NP    Other Instructions 1 WEEK PRIOR TO YOUR FOLLOW UP START CHECKING YOUR BLOOD PRESSURE DAILY AND LOG. BRING LOG AND MACHINE TO FOLLOW UP    I,Mathew Stumpf,acting as a scribe for Chilton Si, MD.,have documented all relevant documentation on the behalf of Chilton Si, MD,as directed by  Chilton Si, MD while in the presence of Chilton Si, MD.  I, Lamere Lightner C. Duke Salvia, MD have reviewed all documentation for this visit.  The documentation of the exam, diagnosis, procedures, and orders on 07/08/2022 are all accurate and complete.  Signed, Chilton Si, MD 07/08/2022  5:35 PM [facility]

## 2022-07-08 NOTE — Assessment & Plan Note (Signed)
Continue carvedilol 

## 2022-07-08 NOTE — Assessment & Plan Note (Signed)
Volume status is stable.  Continue with compressive wrap on the right lower extremity.  Blood pressure was elevated in the office today.  It did improve on repeat.  Today he is not feeling well at due to a cold.  He notes that his blood pressures have been in the 120s to low 130s at home.  He is doing a good job of losing weight.  Will not make any changes to his regimen today.  For now, continue carvedilol, hydralazine, spironolactone.  He is not on Entresto due to angioedema.  If he needs additional agents, would consider increasing spironolactone or hydralazine.

## 2022-07-08 NOTE — Assessment & Plan Note (Signed)
He is making steady progress with weight loss.  Continue Ozempic.  Encouraged increased exercise.

## 2022-07-16 ENCOUNTER — Ambulatory Visit (INDEPENDENT_AMBULATORY_CARE_PROVIDER_SITE_OTHER): Payer: BC Managed Care – PPO | Admitting: Family Medicine

## 2022-07-16 ENCOUNTER — Encounter: Payer: Self-pay | Admitting: Family Medicine

## 2022-07-16 VITALS — BP 142/88 | HR 84 | Temp 98.6°F | Resp 18 | Wt >= 6400 oz

## 2022-07-16 DIAGNOSIS — Z794 Long term (current) use of insulin: Secondary | ICD-10-CM

## 2022-07-16 DIAGNOSIS — E11622 Type 2 diabetes mellitus with other skin ulcer: Secondary | ICD-10-CM

## 2022-07-16 DIAGNOSIS — J209 Acute bronchitis, unspecified: Secondary | ICD-10-CM

## 2022-07-16 DIAGNOSIS — M25561 Pain in right knee: Secondary | ICD-10-CM

## 2022-07-16 DIAGNOSIS — M25562 Pain in left knee: Secondary | ICD-10-CM

## 2022-07-16 MED ORDER — OZEMPIC (1 MG/DOSE) 4 MG/3ML ~~LOC~~ SOPN: 1.0000 mg | PEN_INJECTOR | SUBCUTANEOUS | 3 refills | Status: DC

## 2022-07-16 MED ORDER — AZITHROMYCIN 250 MG PO TABS: ORAL_TABLET | ORAL | 0 refills | Status: AC

## 2022-07-16 MED ORDER — TRAMADOL HCL 50 MG PO TABS: 50.0000 mg | ORAL_TABLET | Freq: Four times a day (QID) | ORAL | 0 refills | Status: DC | PRN

## 2022-07-16 NOTE — Progress Notes (Signed)
-  cough x 10 days, yellow phlegm,SOB  -OTC medication are not helping

## 2022-07-19 ENCOUNTER — Encounter: Payer: Self-pay | Admitting: Family Medicine

## 2022-07-19 NOTE — Progress Notes (Signed)
Established Patient Office Visit  Subjective    Patient ID: [name], male    DOB: [birth year]  Age: 42 y.o. MRN: [id number]  CC:  Chief Complaint  Patient presents with   Cough    HPI [name] presents with complaint of cough for several days that is now productive of yellowish sputum. Some SOB. Patient denies fever/chills or viral sx.    Outpatient Encounter Medications as of 07/16/2022  Medication Sig   azithromycin (ZITHROMAX) 250 MG tablet Take 2 tablets on day 1, then 1 tablet daily on days 2 through 5   Accu-Chek Softclix Lancets lancets Use as instructed   Blood Glucose Monitoring Suppl (ACCU-CHEK GUIDE ME) w/Device KIT USE UP TO 4 TIMES A DAY AS DIRECTED   carvedilol (COREG) 25 MG tablet Take 1 tablet (25 mg total) by mouth 2 (two) times daily.   EPINEPHrine 0.3 mg/0.3 mL IJ SOAJ injection Inject 0.3 mg into the muscle as needed for anaphylaxis.   furosemide (LASIX) 80 MG tablet Take 1 tablet (80 mg total) by mouth 2 (two) times daily.   gabapentin (NEURONTIN) 300 MG capsule Take 1 capsule (300 mg total) by mouth 3 (three) times daily.   glucose blood (ACCU-CHEK GUIDE) test strip Use as instructed   hydrALAZINE (APRESOLINE) 50 MG tablet Take 1 tablet (50 mg total) by mouth in the morning and at bedtime.   metFORMIN (GLUCOPHAGE) 500 MG tablet Take 1 tablet (500 mg total) by mouth daily with breakfast.   OZEMPIC, 1 MG/DOSE, 4 MG/3ML SOPN Inject 1 mg into the skin once a week.   spironolactone (ALDACTONE) 25 MG tablet Take 1 tablet (25 mg total) by mouth daily.   traMADol (ULTRAM) 50 MG tablet Take 1 tablet (50 mg total) by mouth every 6 (six) hours as needed for moderate pain.   [DISCONTINUED] OZEMPIC, 1 MG/DOSE, 4 MG/3ML SOPN Inject 1 mg into the skin once a week.   [DISCONTINUED] traMADol (ULTRAM) 50 MG tablet Take 1 tablet (50 mg total) by mouth every 6 (six) hours as needed for moderate pain.   No facility-administered encounter medications on file as of  07/16/2022.    Past Medical History:  Diagnosis Date   Chronic venous insufficiency    a. as a premature infant, required venous access for care with subsequent DVT and some sort of procedure -> eventually went on to have vein bypass around 2005.   Hypertension    Lymphedema    Morbid obesity (HCC)    OSA on CPAP     Past Surgical History:  Procedure Laterality Date   balloon stenting Right    right leg   VEIN LIGATION AND STRIPPING      Family History  Problem Relation Age of Onset   Diabetes Mother    Cancer Father        colon   Cancer Maternal Grandmother    Cancer Maternal Grandfather     Social History   Socioeconomic History   Marital status: Single    Spouse name: Not on file   Number of children: Not on file   Years of education: Not on file   Highest education level: Not on file  Occupational History   Not on file  Tobacco Use   Smoking status: Never   Smokeless tobacco: Never  Substance and Sexual Activity   Alcohol use: Yes    Alcohol/week: 4.0 standard drinks of alcohol    Types: 4 Standard drinks or equivalent per week  Comment: liquor - pt states once a week   Drug use: No   Sexual activity: Not on file  Other Topics Concern   Not on file  Social History Narrative   Not on file   Social Determinants of Health   Financial Resource Strain: Not on file  Food Insecurity: Not on file  Transportation Needs: Not on file  Physical Activity: Not on file  Stress: Not on file  Social Connections: Not on file  Intimate Partner Violence: Not on file    Review of Systems  Constitutional:  Negative for chills and fever.  Respiratory:  Positive for cough, sputum production and shortness of breath.   All other systems reviewed and are negative.       Objective    BP (!) 142/88   Pulse 84   Temp 98.6 F (37 C) (Oral)   Resp 18   Wt (!) 481 lb 3.2 oz (218.3 kg)   SpO2 93%   BMI 61.78 kg/m   Physical Exam Vitals and nursing note  reviewed.  Constitutional:      General: He is not in acute distress. Cardiovascular:     Rate and Rhythm: Normal rate and regular rhythm.  Pulmonary:     Effort: Pulmonary effort is normal. No respiratory distress.     Breath sounds: Normal breath sounds.  Abdominal:     Palpations: Abdomen is soft.     Tenderness: There is no abdominal tenderness.  Musculoskeletal:     Right knee: No deformity. Decreased range of motion. Tenderness present.     Left knee: No deformity. Decreased range of motion. Tenderness present.  Neurological:     General: No focal deficit present.     Mental Status: He is alert and oriented to person, place, and time.         Assessment & Plan:   1. Acute bronchitis, unspecified organism Zithromax prescribed  2. Type 2 diabetes mellitus with other skin ulcer, with [place]-term current use of insulin (HCC) Appears stable. Ozempic refilled.   3. Pain in both knees, unspecified chronicity Tramadol refilled.   No follow-ups on file.   Tommie Raymond, MD

## 2022-09-06 ENCOUNTER — Other Ambulatory Visit: Payer: Self-pay | Admitting: Family Medicine

## 2022-09-08 ENCOUNTER — Other Ambulatory Visit: Payer: Self-pay | Admitting: Family Medicine

## 2022-09-08 DIAGNOSIS — E119 Type 2 diabetes mellitus without complications: Secondary | ICD-10-CM

## 2022-09-09 ENCOUNTER — Other Ambulatory Visit: Payer: Self-pay | Admitting: Family Medicine

## 2022-09-09 ENCOUNTER — Telehealth: Payer: Self-pay | Admitting: Family Medicine

## 2022-09-09 DIAGNOSIS — E119 Type 2 diabetes mellitus without complications: Secondary | ICD-10-CM

## 2022-09-09 MED ORDER — METFORMIN HCL 500 MG PO TABS
500.0000 mg | ORAL_TABLET | Freq: Every day | ORAL | 0 refills | Status: DC
Start: 2022-09-09 — End: 2022-11-27

## 2022-09-09 NOTE — Telephone Encounter (Signed)
Pt canceled follow up appt scheduled in Aug via MyChart. Called patient to confirm if wanted to reschedule, pt stated [place] be [place] and does not need appt anymore. Patient stated he needs refill for Metformin.

## 2022-10-01 ENCOUNTER — Telehealth: Payer: Self-pay | Admitting: Family Medicine

## 2022-10-01 NOTE — Telephone Encounter (Signed)
Patient has been  sent to scheduler for appointment

## 2022-10-01 NOTE — Telephone Encounter (Signed)
Copied from CRM [id number]. Topic: General - Other >> Oct 01, 2022  9:57 AM Franchot Heidelberg wrote: Reason for CRM: Pt needs a letter stating that he has physical disabilities and diabetes on official letter head with his PCP's signature. Must state that he "can be considered for employment under schedule A hiring authority [id number] [phone] (U) thank you for your interest for considering the patient for your employment, you may contact provider at the office (with contact information)"

## 2022-10-02 ENCOUNTER — Telehealth: Payer: BC Managed Care – PPO | Admitting: Family Medicine

## 2022-10-24 ENCOUNTER — Encounter (HOSPITAL_BASED_OUTPATIENT_CLINIC_OR_DEPARTMENT_OTHER): Payer: Self-pay | Admitting: Family

## 2022-10-24 ENCOUNTER — Ambulatory Visit (HOSPITAL_BASED_OUTPATIENT_CLINIC_OR_DEPARTMENT_OTHER): Payer: Self-pay | Admitting: Family

## 2022-10-24 VITALS — BP 134/82 | HR 103 | Ht 74.0 in | Wt >= 6400 oz

## 2022-10-24 DIAGNOSIS — I10 Essential (primary) hypertension: Secondary | ICD-10-CM | POA: Diagnosis not present

## 2022-10-24 DIAGNOSIS — Z6841 Body Mass Index (BMI) 40.0 and over, adult: Secondary | ICD-10-CM

## 2022-10-24 DIAGNOSIS — G4733 Obstructive sleep apnea (adult) (pediatric): Secondary | ICD-10-CM | POA: Diagnosis not present

## 2022-10-24 DIAGNOSIS — I509 Heart failure, unspecified: Secondary | ICD-10-CM | POA: Diagnosis not present

## 2022-10-24 MED ORDER — SPIRONOLACTONE 25 MG PO TABS
25.0000 mg | ORAL_TABLET | Freq: Every day | ORAL | 3 refills | Status: DC
Start: 2022-10-24 — End: 2023-01-20

## 2022-10-24 MED ORDER — CARVEDILOL 25 MG PO TABS
25.0000 mg | ORAL_TABLET | Freq: Two times a day (BID) | ORAL | 3 refills | Status: DC
Start: 2022-10-24 — End: 2023-04-18

## 2022-10-24 MED ORDER — HYDRALAZINE HCL 50 MG PO TABS: 50.0000 mg | ORAL_TABLET | Freq: Two times a day (BID) | ORAL | 3 refills | Status: DC

## 2022-10-24 NOTE — Progress Notes (Signed)
Cardiology Office Note:  .   Date:  10/24/2022  ID:  [name], DOB 06-05-1980, MRN [id number] PCP: Georganna Skeans, MD  [facility] Providers Cardiologist:  Chilton Si, MD    History of Present Illness: .   [name] is a 42 y.o. male with a hx of hx of chronic heart failure, hypertension, hyperlipidemia, diabetes, morbid obesity, OSA, venous insufficiency.   He was admitted 03/20/16-04/05/16 for acute heart failure. Echocardiogram was not interpretable due to body habitus. He was diuresed with IV Lasix with negative output of 47.2 L. H e was discharged from 631 pounds on admission to 488 pounds on discharge. His creatnine remained stable.  Seen 04/2018 for routine follow-up and lost to follow-up until July 2022.   Evaluated 10/06/20 with worsening LE edema, dyspnea on exertion. Metolazone 2.5mg  one tablet thirty minutes prior to Furosemide was started on Saturdays and Tuesdays. Seen in follow up 10/30/20 with myriad of concerns including LE edema with LRE in Unna boot, hand numbness, palpitations. Due to worsening renal function Metolazone was discontinued. At clinic 12/2020 he was transitioned from Lisinopril to [facility]. After visit 05/2021, sleep study revealed OSA.  Carotid duplex 03/2022 with no stenosis.  ED visit 08/30/2021 for angioedema after using Aveeno lotion around face and lips.  Was recommended to stop Entresto until cardiology follow-up.  Of note could not rule out allergic reaction to lotion.  Provided short course of prednisone as well as doxycycline due to RLE wound/ulcer   Seen 12/14/21 and Hydralazine increased to 50mg  BID and Ozempic added.   Last visit 07/08/2022.  He was slightly under the weather but doing well from a heart failure perspective.  Blood pressure elevated 171/105 in clinic and also did not take his medication that day, on recheck BP 146/80.  BPs at home are 120s-130s and he was having success with weight loss.  Presents today for follow up.  Weight down 1 lb from last visit. Enjoys spending time with his 5 month old. 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 Endorses following a heart healthy diet focusing on proteins and veggies, limiting carbohydrates. BP at home 128-130/80-85. Has been out of Spironolactone for a week. 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 Plans to work a Animator role.  ROS: Please see the history of present illness.    All other systems reviewed and are negative.   Studies Reviewed: [name]   EKG Interpretation Date/Time:  Thursday October 24 2022 13:43:19 EDT Ventricular Rate:  103 PR Interval:  182 QRS Duration:  78 QT Interval:  334 QTC Calculation: 437 R Axis:   13  Text Interpretation: Sinus tachycardia No acute ST/T wave changes. Confirmed by [name] ([id number]) on 10/24/2022 1:46:35 PM    Cardiac Studies & Procedures       ECHOCARDIOGRAM  ECHOCARDIOGRAM COMPLETE 03/21/2016  Narrative *Tony* *[facility]* [address] [place], [place] [address] [phone]  ------------------------------------------------------------------- Transthoracic Echocardiography  Patient:    Shawn, [name] MR #:       [id number] Study Date: 03/21/2016 Gender:     M Age:        35 Height:     190.5 cm Weight:     273.5 kg BSA:        3.98 m^2 Pt. Status: Room:       [address],  Toney Rakes     [place], Georgetta Haber N REFERRING    [place], [place] N SONOGRAPHER  Nolon Rod, RDCS ATTENDING    Joseph Art PERFORMING   [facility], Inpatient  cc:  -------------------------------------------------------------------  ------------------------------------------------------------------- Indications:      Dyspnea 786.09.  ------------------------------------------------------------------- History:   Risk factors:  Hypertension. Morbidly  obese.  ------------------------------------------------------------------- Impressions:  - Uninterpretable study, even after Definity contrast administration.  ------------------------------------------------------------------- Study data:   Study status:  Routine.  Procedure:  Transthoracic echocardiography. Image quality was suboptimal. The study was technically difficult, as a result of poor acoustic windows, poor sound wave transmission, restricted patient mobility, and body habitus. Intravenous contrast (Definity) was administered.  Study completion:  There were no complications.          Transthoracic echocardiography.  M-mode, complete 2D, spectral Doppler, and color Doppler.  Birthdate:  Patient birthdate: [birth year].  Age:  Patient is 42 yr old.  Sex:  Gender: male.    BMI: 75.4 kg/m^2.  Blood pressure:     154/79  Patient status:  Inpatient.  Study date: Study date: 03/21/2016. Study time: 09:14 AM.  Location:  Bedside.  -------------------------------------------------------------------  ------------------------------------------------------------------- Prepared and Electronically Authenticated by  Thurmon Fair, MD 2018-01-11T13:38:31             Risk Assessment/Calculations:         STOP-Bang Score:         Physical Exam:   VS:  BP 134/82   Pulse (!) 103   Ht 6\' 2"  (1.88 m)   Wt (!) 493 lb (223.6 kg)   BMI 63.30 kg/m    Wt Readings from Last 3 Encounters:  10/24/22 (!) 493 lb (223.6 kg)  07/16/22 (!) 481 lb 3.2 oz (218.3 kg)  07/08/22 (!) 494 lb 9.6 oz (224.3 kg)    GEN: Well nourished, overweight, well developed in no acute distress NECK: No JVD; No carotid bruits CARDIAC: RRR, no murmurs, rubs, gallops RESPIRATORY:  Clear to auscultation without rales, wheezing or rhonchi  ABDOMEN: Soft, non-tender, non-distended EXTREMITIES:  No edema; No deformity   ASSESSMENT AND PLAN: .    Chronic congestive heart failure-unknown systolic or diastolic.   Echo not very interpretable due to body habitus.  No ACR/ARB/ARNI due to previous angioedema. Continue Carvedilol 25mg  BID, lasix 80mg  BID, Spironolactone 25mg  QD, Hydralazine 50mg  BID. Low sodium diet, fluid restriction <2L, and daily weights encouraged. Educated to contact our office for weight gain of 2 lbs overnight or 5 lbs in one week.    HTN -BP mildly elevated in clinic. Well controlled at home. Has been out of Spironolactone for 1 week, refill provided. Continue Hydralazine 50mg  BID, Furosemide 80mg  BID, Coreg 25mg  BID. No ACE/ARB/ARNI due to previous angioedema. Discussed to monitor BP at home at least 2 hours after medications and sitting for 5-10 minutes.    OSA- CPAP compliance encouraged.    Morbid obesity / BMI 63 - Weight loss via diet and exercise encouraged. Discussed the impact being overweight would have on cardiovascular risk. Continue Ozempic. Encouraged to discuss increasing dose to 2mg  with his PCP. Encouraged to keep up the great work with his exercise program.  Encouraged to have fasting lab work done.        Dispo: follow up in 4 months  Signed, Alver Sorrow, NP

## 2022-10-24 NOTE — Patient Instructions (Addendum)
Medication Instructions:  Your physician has recommended you make the following change in your medication:   RESUME Spironolactone 25mg  daily  Recommend talking with Dr. Andrey Campanile to see if increasing your Ozempic would be an option to further help weight loss efforts.  *If you need a refill on your cardiac medications before your next appointment, please call your pharmacy*   Lab Work: Please have fasting lab work prior to your next appointment for monitoring of kidneys, liver, and cholesterol.   Please return for Lab work. You may come to the...   [facility] (3rd floor) [address] [place], [place] [address]  Open: 8am-Noon and 1pm-4:30pm  Please ring the doorbell on the small table when you exit the elevator and the Lab Tech will come get you  [facility] at [facility] [address], [place], [place] [address] Open: 8am-1pm, then 2pm-4:30pm   [facility]- Please see attached locations sheet stapled to your lab work with address and hours.    If you have labs (blood work) drawn today and your tests are completely normal, you will receive your results only by: MyChart Message (if you have MyChart) OR A paper copy in the mail If you have any lab test that is abnormal or we need to change your treatment, we will call you to review the results.   Testing/Procedures: EKG today showed sinus tachycardia which is a normal but slightly fast heart rhythm.   Follow-Up: At [facility], you and your health needs are our priority.  As part of our continuing mission to provide you with exceptional heart care, we have created designated Provider Care Teams.  These Care Teams include your primary Cardiologist (physician) and Advanced Practice Providers (APPs -  Physician Assistants and Nurse Practitioners) who all work together to provide you with the care you need, when you need it.  We recommend signing up for the patient portal called  "MyChart".  Sign up information is provided on this After Visit Summary.  MyChart is used to connect with patients for Virtual Visits (Telemedicine).  Patients are able to view lab/test results, encounter notes, upcoming appointments, etc.  Non-urgent messages can be sent to your provider as well.   To learn more about what you can do with MyChart, go to [email or web address].    Your next appointment:   4 month(s)  Provider:   Chilton Si, MD or [name], NP

## 2022-10-30 ENCOUNTER — Encounter: Payer: Self-pay | Admitting: Family Medicine

## 2022-10-30 ENCOUNTER — Telehealth: Payer: BC Managed Care – PPO | Admitting: Family Medicine

## 2022-11-01 ENCOUNTER — Other Ambulatory Visit: Payer: Self-pay

## 2022-11-04 ENCOUNTER — Other Ambulatory Visit: Payer: Self-pay | Admitting: Family Medicine

## 2022-11-04 MED ORDER — SEMAGLUTIDE (2 MG/DOSE) 8 MG/3ML ~~LOC~~ SOPN: 2.0000 mg | PEN_INJECTOR | SUBCUTANEOUS | 1 refills | Status: DC

## 2022-11-07 ENCOUNTER — Ambulatory Visit: Payer: BC Managed Care – PPO | Admitting: Family Medicine

## 2022-11-22 ENCOUNTER — Other Ambulatory Visit: Payer: Self-pay | Admitting: Family Medicine

## 2022-11-22 DIAGNOSIS — E119 Type 2 diabetes mellitus without complications: Secondary | ICD-10-CM

## 2023-01-20 ENCOUNTER — Other Ambulatory Visit (HOSPITAL_BASED_OUTPATIENT_CLINIC_OR_DEPARTMENT_OTHER): Payer: Self-pay | Admitting: Cardiovascular Disease

## 2023-01-20 DIAGNOSIS — I509 Heart failure, unspecified: Secondary | ICD-10-CM

## 2023-01-20 DIAGNOSIS — I10 Essential (primary) hypertension: Secondary | ICD-10-CM

## 2023-01-24 ENCOUNTER — Other Ambulatory Visit: Payer: Self-pay | Admitting: Family Medicine

## 2023-01-24 ENCOUNTER — Other Ambulatory Visit (HOSPITAL_BASED_OUTPATIENT_CLINIC_OR_DEPARTMENT_OTHER): Payer: Self-pay | Admitting: Family

## 2023-01-24 DIAGNOSIS — I5042 Chronic combined systolic (congestive) and diastolic (congestive) heart failure: Secondary | ICD-10-CM

## 2023-01-27 NOTE — Telephone Encounter (Signed)
Requested Prescriptions  Pending Prescriptions Disp Refills   gabapentin (NEURONTIN) 300 MG capsule [Pharmacy Med Name: GABAPENTIN 300 MG CAPSULE] 90 capsule     Sig: TAKE 1 CAPSULE BY MOUTH THREE TIMES A DAY     Neurology: Anticonvulsants - gabapentin Failed - 01/24/2023  8:40 PM      Failed - Cr in normal range and within 360 days    Creat  Date Value Ref Range Status  04/12/2016 1.01 0.60 - 1.35 mg/dL Final   Creatinine, Ser  Date Value Ref Range Status  12/21/2021 0.98 0.76 - 1.27 mg/dL Final         Passed - Completed PHQ-2 or PHQ-9 in the last 360 days      Passed - Valid encounter within last 12 months    Recent Outpatient Visits           6 months ago Acute bronchitis, unspecified organism   [place] [facility], Lauris Poag, MD   8 months ago Type 2 diabetes mellitus with other skin ulcer, with Castor-term current use of insulin (HCC)   [place] [facility], Lauris Poag, MD   11 months ago Type 2 diabetes mellitus with other skin ulcer, with Cadet-term current use of insulin (HCC)   [place] [facility], MD   2 years ago Uncontrolled hypertension   [place] [facility], MD   2 years ago Atrial fibrillation, unspecified type [facility])   [place] [facility], MD       Future Appointments             In 2 weeks Alver Sorrow, NP [facility], [facility]             OZEMPIC, 0.25 OR 0.5 MG/DOSE, 2 MG/3ML SOPN [Pharmacy Med Name: OZEMPIC 0.25-0.5 MG/DOSE PEN]      Sig: INJECT 0.25 MG INTO THE SKIN ONCE A WEEK FOR 30 DAYS, THEN 0.5 MG ONCE A WEEK.     Endocrinology:  Diabetes - GLP-1 Receptor Agonists - semaglutide Failed - 01/24/2023  8:40 PM      Failed - HBA1C in normal range and within 180 days    Hemoglobin A1C  Date Value Ref Range Status  05/09/2022 6.3 (A) 4.0 - 5.6 %  Final   Hgb A1c MFr Bld  Date Value Ref Range Status  12/21/2021 6.9 (H) 4.8 - 5.6 % Final    Comment:             Prediabetes: 5.7 - 6.4          Diabetes: >6.4          Glycemic control for adults with diabetes: <7.0          Failed - Cr in normal range and within 360 days    Creat  Date Value Ref Range Status  04/12/2016 1.01 0.60 - 1.35 mg/dL Final   Creatinine, Ser  Date Value Ref Range Status  12/21/2021 0.98 0.76 - 1.27 mg/dL Final         Failed - Valid encounter within last 6 months    Recent Outpatient Visits           6 months ago Acute bronchitis, unspecified organism   [facility], Lauris Poag, MD   8 months ago Type 2 diabetes mellitus with  other skin ulcer, with Winecoff-term current use of insulin (HCC)   [place] [facility], Lauris Poag, MD   11 months ago Type 2 diabetes mellitus with other skin ulcer, with Utt-term current use of insulin (HCC)   [place] [facility], MD   2 years ago Uncontrolled hypertension   [place] [facility] at [facility], MD   2 years ago Atrial fibrillation, unspecified type [facility])   [place] [facility] at [facility], MD       Future Appointments             In 2 weeks Alver Sorrow, NP [facility] at [facility], [place]

## 2023-01-27 NOTE — Telephone Encounter (Signed)
Requested medication (s) are due for refill today: yes  Requested medication (s) are on the active medication list: yes  Last refill:  09/06/22 #90 1 RF  Future visit scheduled:no  Notes to clinic:  Dose changed   Requested Prescriptions  Pending Prescriptions Disp Refills   gabapentin (NEURONTIN) 300 MG capsule [Pharmacy Med Name: GABAPENTIN 300 MG CAPSULE] 90 capsule     Sig: TAKE 1 CAPSULE BY MOUTH THREE TIMES A DAY     Neurology: Anticonvulsants - gabapentin Failed - 01/24/2023  8:40 PM      Failed - Cr in normal range and within 360 days    Creat  Date Value Ref Range Status  04/12/2016 1.01 0.60 - 1.35 mg/dL Final   Creatinine, Ser  Date Value Ref Range Status  12/21/2021 0.98 0.76 - 1.27 mg/dL Final         Passed - Completed PHQ-2 or PHQ-9 in the last 360 days      Passed - Valid encounter within last 12 months    Recent Outpatient Visits           6 months ago Acute bronchitis, unspecified organism   [place] [facility], Lauris Poag, MD   8 months ago Type 2 diabetes mellitus with other skin ulcer, with Herendeen-term current use of insulin (HCC)   [place] [facility] at [facility], Lauris Poag, MD   11 months ago Type 2 diabetes mellitus with other skin ulcer, with Senn-term current use of insulin (HCC)   [place] [facility], MD   2 years ago Uncontrolled hypertension   [place] [facility] at [facility], MD   2 years ago Atrial fibrillation, unspecified type [facility])   [place] [facility] at [facility], MD       Future Appointments             In 2 weeks Alver Sorrow, NP [facility] at [facility], [facility]            Refused Prescriptions Disp Refills   OZEMPIC, 0.25 OR 0.5 MG/DOSE, 2 MG/3ML SOPN [Pharmacy Med Name: OZEMPIC 0.25-0.5 MG/DOSE PEN]      Sig: INJECT 0.25 MG INTO THE SKIN ONCE A WEEK FOR 30 DAYS, THEN  0.5 MG ONCE A WEEK.     Endocrinology:  Diabetes - GLP-1 Receptor Agonists - semaglutide Failed - 01/24/2023  8:40 PM      Failed - HBA1C in normal range and within 180 days    Hemoglobin A1C  Date Value Ref Range Status  05/09/2022 6.3 (A) 4.0 - 5.6 % Final   Hgb A1c MFr Bld  Date Value Ref Range Status  12/21/2021 6.9 (H) 4.8 - 5.6 % Final    Comment:             Prediabetes: 5.7 - 6.4          Diabetes: >6.4          Glycemic control for adults with diabetes: <7.0          Failed - Cr in normal range and within 360 days    Creat  Date Value Ref Range Status  04/12/2016 1.01 0.60 - 1.35 mg/dL Final   Creatinine, Ser  Date Value Ref Range Status  12/21/2021 0.98 0.76 - 1.27 mg/dL Final         Failed - Valid encounter within last 6  months    Recent Outpatient Visits           6 months ago Acute bronchitis, unspecified organism   [place] [facility] at [facility], Lauris Poag, MD   8 months ago Type 2 diabetes mellitus with other skin ulcer, with Willems-term current use of insulin [facility])   [place] [facility], Lauris Poag, MD   11 months ago Type 2 diabetes mellitus with other skin ulcer, with Goette-term current use of insulin (HCC)   [place] [facility] at [facility], MD   2 years ago Uncontrolled hypertension   [place] [facility], MD   2 years ago Atrial fibrillation, unspecified type [facility])   [place] [facility] at [facility], MD       Future Appointments             In 2 weeks Alver Sorrow, NP [facility] at [facility], [place]

## 2023-02-13 ENCOUNTER — Encounter (HOSPITAL_BASED_OUTPATIENT_CLINIC_OR_DEPARTMENT_OTHER): Payer: Self-pay

## 2023-02-14 ENCOUNTER — Encounter (HOSPITAL_BASED_OUTPATIENT_CLINIC_OR_DEPARTMENT_OTHER): Payer: Self-pay | Admitting: Family

## 2023-02-14 ENCOUNTER — Ambulatory Visit (HOSPITAL_BASED_OUTPATIENT_CLINIC_OR_DEPARTMENT_OTHER): Payer: Managed Care, Other (non HMO) | Admitting: Family

## 2023-02-14 ENCOUNTER — Other Ambulatory Visit (HOSPITAL_BASED_OUTPATIENT_CLINIC_OR_DEPARTMENT_OTHER): Payer: Self-pay

## 2023-02-14 VITALS — BP 184/108 | HR 90 | Ht 74.0 in | Wt >= 6400 oz

## 2023-02-14 DIAGNOSIS — I509 Heart failure, unspecified: Secondary | ICD-10-CM

## 2023-02-14 DIAGNOSIS — E782 Mixed hyperlipidemia: Secondary | ICD-10-CM | POA: Diagnosis not present

## 2023-02-14 DIAGNOSIS — G4733 Obstructive sleep apnea (adult) (pediatric): Secondary | ICD-10-CM

## 2023-02-14 DIAGNOSIS — I872 Venous insufficiency (chronic) (peripheral): Secondary | ICD-10-CM

## 2023-02-14 DIAGNOSIS — E1165 Type 2 diabetes mellitus with hyperglycemia: Secondary | ICD-10-CM | POA: Diagnosis not present

## 2023-02-14 NOTE — Patient Instructions (Addendum)
Medication Instructions:  Your physician has recommended you make the following change in your medication:   Take Hydralazine 50mg  when you get home today   *If you need a refill on your cardiac medications before your next appointment, please call your pharmacy*  Lab Work: Your physician recommends that you return for lab work today A1c, CMP, direct LDL, CBC, BNP  If you have labs (blood work) drawn today and your tests are completely normal, you will receive your results only by: MyChart Message (if you have MyChart) OR A paper copy in the mail If you have any lab test that is abnormal or we need to change your treatment, we will call you to review the results.  Testing/Procedures: Your provider has recommended ultrasound of your lower extremities.  Follow-Up: At [facility], you and your health needs are our priority.  As part of our continuing mission to provide you with exceptional heart care, we have created designated Provider Care Teams.  These Care Teams include your primary Cardiologist (physician) and Advanced Practice Providers (APPs -  Physician Assistants and Nurse Practitioners) who all work together to provide you with the care you need, when you need it.  We recommend signing up for the patient portal called "MyChart".  Sign up information is provided on this After Visit Summary.  MyChart is used to connect with patients for Virtual Visits (Telemedicine).  Patients are able to view lab/test results, encounter notes, upcoming appointments, etc.  Non-urgent messages can be sent to your provider as well.   To learn more about what you can do with MyChart, go to [email or web address].    Your next appointment:   2-3 week(s)  Please bring blood pressure log with you to next follow up.  Provider:   [name], NP    Other Instructions   Recommend scheduling vascular appointment [phone]  Recommend scheduling follow up with primary care for management  of diabetes  Tips to Measure your Blood Pressure Correctly   Here's what you can do to ensure a correct reading:  Don't drink a caffeinated beverage or smoke during the 30 minutes before the test.  Sit quietly for five minutes before the test begins.  During the measurement, sit in a chair with your feet on the floor and your arm supported so your elbow is at about heart level.  The inflatable part of the cuff should completely cover at least 80% of your upper arm, and the cuff should be placed on bare skin, not over a shirt.  Don't talk during the measurement.  Blood pressure categories  Blood pressure category SYSTOLIC (upper number)  DIASTOLIC (lower number)  Normal Less than 120 mm Hg and Less than 80 mm Hg  Elevated 120-129 mm Hg and Less than 80 mm Hg  High blood pressure: Stage 1 hypertension 130-139 mm Hg or 80-89 mm Hg  High blood pressure: Stage 2 hypertension 140 mm Hg or higher or 90 mm Hg or higher  Hypertensive crisis (consult your doctor immediately) Higher than 180 mm Hg and/or Higher than 120 mm Hg  Source: American Heart Association and American Stroke Association. For more on getting your blood pressure under control, buy Controlling Your Blood Pressure, a Special Health Report from [facility].   Blood Pressure Log   Date   Time  Blood Pressure  Position  Example: Nov 1 9 AM 124/78 sitting

## 2023-02-14 NOTE — Progress Notes (Signed)
Cardiology Office Note:  .   Date:  02/14/2023  ID:  [name], DOB [birth year], MRN [id number] PCP: Georganna Skeans, MD  [facility] Providers Cardiologist:  Chilton Si, MD    History of Present Illness: .   [name] is a 42 y.o. male with a hx of hx of chronic heart failure, hypertension, hyperlipidemia, diabetes, morbid obesity, OSA, venous insufficiency.   He was admitted 03/20/16-04/05/16 for acute heart failure. Echocardiogram was not interpretable due to body habitus. He was diuresed with IV Lasix with negative output of 47.2 L. H e was discharged from 631 pounds on admission to 488 pounds on discharge. His creatnine remained stable.  Seen 04/2018 for routine follow-up and lost to follow-up until July 2022.   Evaluated 10/06/20 with worsening LE edema, dyspnea on exertion. Metolazone 2.5mg  one tablet thirty minutes prior to Furosemide was started on Saturdays and Tuesdays. Seen in follow up 10/30/20 with myriad of concerns including LE edema with LRE in Unna boot, hand numbness, palpitations. Due to worsening renal function Metolazone was discontinued. At clinic 12/2020 he was transitioned from Lisinopril to [facility]. After visit 05/2021, sleep study revealed OSA.  Carotid duplex 03/2022 with no stenosis.  ED visit 08/30/2021 for angioedema after using Aveeno lotion around face and lips.  Was recommended to stop Entresto until cardiology follow-up.  Of note could not rule out allergic reaction to lotion.  Provided short course of prednisone as well as doxycycline due to RLE wound/ulcer   Seen 12/14/21 and Hydralazine increased to 50mg  BID and Ozempic added.   Last visit 10/24/22 with BP elevated, recommended to resume Spironolactone as had run out. Encouraged to discuss increasing dose Ozempic with PCP.  Presents today for follow up. Weight gain of 39 lbs since clinic visit 4 months ago. Endorses not routinely taking diuretic nor Ozempic. No chest pain, dyspnea. Reports ulcer to  right leg which his wife is assisting to wrap. Bilateral LE edema. Previously followed with [facility] and wound clinic. Also notes his left abdomen feels more firm to touch than right side. No change in bowel habits, non tender on palpation.   ROS: Please see the history of present illness.    All other systems reviewed and are negative.   Studies Reviewed: .        Cardiac Studies & Procedures       ECHOCARDIOGRAM  ECHOCARDIOGRAM COMPLETE 03/21/2016  Narrative *[place]* *[facility]* [address] [place], [place] [address] [phone]  ------------------------------------------------------------------- Transthoracic Echocardiography  Patient:    Shawn, [name] MR #:       [id number] Study Date: 03/21/2016 Gender:     M Age:        35 Height:     190.5 cm Weight:     273.5 kg BSA:        3.98 m^2 Pt. Status: Room:       [address]    Leavy Cella     [place], Meryle Ready REFERRING    [place], [place] SONOGRAPHER  Nolon Rod, RDCS ATTENDING    Joseph Art PERFORMING   [facility], Inpatient  cc:  -------------------------------------------------------------------  ------------------------------------------------------------------- Indications:      Dyspnea 786.09.  ------------------------------------------------------------------- History:   Risk factors:  Hypertension. Morbidly obese.  ------------------------------------------------------------------- Impressions:  - Uninterpretable study, even after Definity contrast administration.  ------------------------------------------------------------------- Study data:   Study status:  Routine.  Procedure:  Transthoracic echocardiography. Image quality was suboptimal. The study  was technically difficult, as a result of poor acoustic windows, poor sound wave transmission, restricted patient mobility, and body habitus. Intravenous contrast (Definity) was administered.   Study completion:  There were no complications.          Transthoracic echocardiography.  M-mode, complete 2D, spectral Doppler, and color Doppler.  Birthdate:  Patient birthdate: Jul 04, 1980.  Age:  Patient is 42 yr old.  Sex:  Gender: male.    BMI: 75.4 kg/m^2.  Blood pressure:     154/79  Patient status:  Inpatient.  Study date: Study date: 03/21/2016. Study time: 09:14 AM.  Location:  Bedside.  -------------------------------------------------------------------  ------------------------------------------------------------------- Prepared and Electronically Authenticated by  Thurmon Fair, MD 2018-01-11T13:38:31             Risk Assessment/Calculations:        STOP-Bang Score:         Physical Exam:   VS:  BP (!) 199/101   Pulse 90   Ht 6\' 2"  (1.88 m)   Wt (!) 532 lb (241.3 kg)   SpO2 (!) 89%   BMI 68.30 kg/m    Wt Readings from Last 3 Encounters:  02/14/23 (!) 532 lb (241.3 kg)  10/24/22 (!) 493 lb (223.6 kg)  07/16/22 (!) 481 lb 3.2 oz (218.3 kg)    GEN: Well nourished, overweight, well developed in no acute distress NECK: No JVD; No carotid bruits CARDIAC: RRR, no murmurs, rubs, gallops RESPIRATORY:  Clear to auscultation without rales, wheezing or rhonchi  ABDOMEN: Soft, non-tender, non-distended EXTREMITIES:  Bilateral LE with 2+ pitting edema; RLE in wrap. No deformity   ASSESSMENT AND PLAN: .    Chronic congestive heart failure-unknown systolic or diastolic.  Echo 03/2016 not very interpretable due to body habitus.  No ACR/ARB/ARNI due to previous angioedema. Continue Carvedilol 25mg  BID, lasix 80mg  BID, Spironolactone 25mg  QD, Hydralazine 50mg  BID. Has not been taking Lasix routinely.  Strongly encouraged resume taking Lasix as prescribed given 39 lb weight gain over last 4 months.  Low sodium diet, fluid restriction <2L, and daily weights encouraged. Educated to contact our office for weight gain of 2 lbs overnight or 5 lbs in one week.  Discussed possible  cardiac PET vs MRI for assessment of LVEF but would need weight <500 lbs, re-discus at follow up.    HTN -BP markedly elevated in clinic. He is uncertain if he took his medications today. He will take dose of Hydralazine when he gets home. Often missing doses of medications. Recommend adherence to regimen of Spironolactone 25mg  daily, Hydralazine 50mg  BID, Furosemide 80mg  BID, Coreg 25mg  BID. No ACE/ARB/ARNI due to previous angioedema. Discussed to monitor BP at home at least 2 hours after medications and sitting for 5-10 minutes.    OSA- CPAP compliance encouraged.   LE edema / Venous insufficiency - Reports RLE ulcer (covered with leg wrap at time of office visit). No pain in legs consistent with claudication, no indication for ABI. He requests RLE venous study will order but discussed will need to follow up with VVS. Anticipate majority of LE edema exacerbated by nonadherence to diuretic regimen.   Morbid obesity / BMI 63 - Weight loss via diet and exercise encouraged. Discussed the impact being overweight would have on cardiovascular risk. Encouraged to resume Ozempic. Presently not taking due to copay, anticipate due to deductible - encouraged him to discuss with pharmacy/insurance.        Dispo: follow up in 2-3 weeks  Signed, Alver Sorrow, NP

## 2023-02-15 LAB — COMPREHENSIVE METABOLIC PANEL
ALT: 13 [IU]/L (ref 0–44)
AST: 15 [IU]/L (ref 0–40)
Albumin: 4.3 g/dL (ref 4.1–5.1)
Alkaline Phosphatase: 66 [IU]/L (ref 44–121)
BUN/Creatinine Ratio: 14 (ref 9–20)
BUN: 15 mg/dL (ref 6–24)
Bilirubin Total: 0.3 mg/dL (ref 0.0–1.2)
CO2: 26 mmol/L (ref 20–29)
Calcium: 9.3 mg/dL (ref 8.7–10.2)
Chloride: 103 mmol/L (ref 96–106)
Creatinine, Ser: 1.07 mg/dL (ref 0.76–1.27)
Globulin, Total: 2.8 g/dL (ref 1.5–4.5)
Glucose: 119 mg/dL — ABNORMAL HIGH (ref 70–99)
Potassium: 4.1 mmol/L (ref 3.5–5.2)
Sodium: 143 mmol/L (ref 134–144)
Total Protein: 7.1 g/dL (ref 6.0–8.5)
eGFR: 89 mL/min/{1.73_m2} (ref >59)

## 2023-02-15 LAB — HEMOGLOBIN A1C
Est. average glucose Bld gHb Est-mCnc: 146 mg/dL
Hgb A1c MFr Bld: 6.7 % — ABNORMAL HIGH (ref 4.8–5.6)

## 2023-02-15 LAB — CBC
Hematocrit: 58.8 % — ABNORMAL HIGH (ref 37.5–51.0)
Hemoglobin: 18.1 g/dL — ABNORMAL HIGH (ref 13.0–17.7)
MCH: 24.9 pg — ABNORMAL LOW (ref 26.6–33.0)
MCHC: 30.8 g/dL — ABNORMAL LOW (ref 31.5–35.7)
MCV: 81 fL (ref 79–97)
Platelets: 378 10*3/uL (ref 150–450)
RBC: 7.26 x10E6/uL (ref 4.14–5.80)
RDW: 17 % — ABNORMAL HIGH (ref 11.6–15.4)
WBC: 10.5 10*3/uL (ref 3.4–10.8)

## 2023-02-15 LAB — LDL CHOLESTEROL, DIRECT: LDL Direct: 64 mg/dL (ref 0–99)

## 2023-02-15 LAB — BRAIN NATRIURETIC PEPTIDE: BNP: 223.9 pg/mL — ABNORMAL HIGH (ref 0.0–100.0)

## 2023-02-18 ENCOUNTER — Telehealth (HOSPITAL_BASED_OUTPATIENT_CLINIC_OR_DEPARTMENT_OTHER): Payer: Self-pay

## 2023-02-18 DIAGNOSIS — I872 Venous insufficiency (chronic) (peripheral): Secondary | ICD-10-CM

## 2023-02-18 DIAGNOSIS — R899 Unspecified abnormal finding in specimens from other organs, systems and tissues: Secondary | ICD-10-CM

## 2023-02-18 NOTE — Telephone Encounter (Signed)
-----   Message from Alver Sorrow sent at 02/18/2023  4:56 PM EST ----- Normal kidneys, liver, electrolytes.    BNP with volume overload, as discussed in clinic important to take diuretics (fluid pill) on regular basis as prescribed.  A1c increased from prior, ensure taking Ozempic regularly.   LDL (bad cholesterol) at goal.   Elevated red blood cells and hemoglobin. Recommend referral to hematology for further evaluation.

## 2023-02-18 NOTE — Telephone Encounter (Signed)
Needs refill on Ozempic. Will have patient follow up with PCP. Patient verified that he has been taking his diuretic regularly. Also discussed hematology referral, pt verbalized understanding.

## 2023-02-20 ENCOUNTER — Other Ambulatory Visit (HOSPITAL_BASED_OUTPATIENT_CLINIC_OR_DEPARTMENT_OTHER): Payer: Self-pay | Admitting: Family

## 2023-02-22 ENCOUNTER — Other Ambulatory Visit: Payer: Self-pay | Admitting: Family Medicine

## 2023-02-23 ENCOUNTER — Encounter (HOSPITAL_BASED_OUTPATIENT_CLINIC_OR_DEPARTMENT_OTHER): Payer: Self-pay | Admitting: Family

## 2023-02-23 ENCOUNTER — Other Ambulatory Visit: Payer: Self-pay | Admitting: Family Medicine

## 2023-02-23 DIAGNOSIS — I10 Essential (primary) hypertension: Secondary | ICD-10-CM

## 2023-03-03 ENCOUNTER — Other Ambulatory Visit: Payer: Self-pay | Admitting: Family Medicine

## 2023-03-03 NOTE — Telephone Encounter (Signed)
Medication Refill -  Most Recent Primary Care Visit:  Provider: Georganna Skeans  Department: [facility]  Visit Type: OFFICE VISIT  Date: 07/16/2022  Medication: gabapentin (NEURONTIN) 300 MG capsule   Has the patient contacted their pharmacy? Yes  (Agent: If yes, when and what did the pharmacy advise?)  Is this the correct pharmacy for this prescription? Yes If no, delete pharmacy and type the correct one.  This is the patient's preferred pharmacy:   [id number] [place], [place] - [address] AT [facility] [address] [place] [address] Phone: [phone] Fax: [phone]   Has the prescription been filled recently? Yes  Is the patient out of the medication? Yes  Has the patient been seen for an appointment in the last year OR does the patient have an upcoming appointment? Yes  Can we respond through MyChart? No  Agent: Please be advised that Rx refills may take up to 3 business days. We ask that you follow-up with your pharmacy.

## 2023-03-04 ENCOUNTER — Ambulatory Visit (HOSPITAL_BASED_OUTPATIENT_CLINIC_OR_DEPARTMENT_OTHER): Payer: Managed Care, Other (non HMO) | Admitting: Family

## 2023-03-04 ENCOUNTER — Other Ambulatory Visit: Payer: Self-pay | Admitting: Family Medicine

## 2023-03-04 ENCOUNTER — Encounter (HOSPITAL_BASED_OUTPATIENT_CLINIC_OR_DEPARTMENT_OTHER): Payer: Self-pay | Admitting: Family

## 2023-03-04 VITALS — BP 154/94 | HR 85 | Ht 72.0 in | Wt >= 6400 oz

## 2023-03-04 DIAGNOSIS — I509 Heart failure, unspecified: Secondary | ICD-10-CM | POA: Diagnosis not present

## 2023-03-04 DIAGNOSIS — G4733 Obstructive sleep apnea (adult) (pediatric): Secondary | ICD-10-CM | POA: Diagnosis not present

## 2023-03-04 DIAGNOSIS — I872 Venous insufficiency (chronic) (peripheral): Secondary | ICD-10-CM

## 2023-03-04 DIAGNOSIS — Z6841 Body Mass Index (BMI) 40.0 and over, adult: Secondary | ICD-10-CM

## 2023-03-04 DIAGNOSIS — E1165 Type 2 diabetes mellitus with hyperglycemia: Secondary | ICD-10-CM

## 2023-03-04 DIAGNOSIS — I10 Essential (primary) hypertension: Secondary | ICD-10-CM

## 2023-03-04 MED ORDER — METOLAZONE 2.5 MG PO TABS: 2.5000 mg | ORAL_TABLET | Freq: Every day | ORAL | 0 refills | Status: DC

## 2023-03-04 NOTE — Telephone Encounter (Signed)
Ordered by heart and vascular center Please send to [name] NP  Requested Prescriptions  Signed Prescriptions Disp Refills   gabapentin (NEURONTIN) 300 MG capsule 90 capsule 1    Sig: TAKE 1 CAPSULE BY MOUTH THREE TIMES A DAY     Neurology: Anticonvulsants - gabapentin Passed - 03/04/2023  4:28 PM      Passed - Cr in normal range and within 360 days    Creat  Date Value Ref Range Status  04/12/2016 1.01 0.60 - 1.35 mg/dL Final   Creatinine, Ser  Date Value Ref Range Status  02/14/2023 1.07 0.76 - 1.27 mg/dL Final         Passed - Completed PHQ-2 or PHQ-9 in the last 360 days      Passed - Valid encounter within last 12 months    Recent Outpatient Visits           7 months ago Acute bronchitis, unspecified organism   [place] [facility], Lauris Poag, MD   9 months ago Type 2 diabetes mellitus with other skin ulcer, with Currin-term current use of insulin (HCC)   [place] [facility], Lauris Poag, MD   1 year ago Type 2 diabetes mellitus with other skin ulcer, with Preziosi-term current use of insulin (HCC)   [place] [facility], MD   2 years ago Uncontrolled hypertension   [place] [facility], Lauris Poag, MD   2 years ago Atrial fibrillation, unspecified type [facility])   [place] [facility], MD       Future Appointments             In 1 month Georganna Skeans, MD [facility] at [facility]   In 1 month Alver Sorrow, NP [facility] [facility], [facility]   In 4 months Chilton Si, MD [facility] at [facility], [facility]            Refused Prescriptions Disp Refills   carvedilol (COREG) 25 MG tablet [Pharmacy Med Name: CARVEDILOL 25 MG TABLET] 180 tablet     Sig: TAKE 1 TABLET BY MOUTH TWICE A DAY     Cardiovascular: Beta Blockers 3 Failed - 03/04/2023   4:28 PM      Failed - Last BP in normal range    BP Readings from Last 1 Encounters:  03/04/23 (!) 154/94         Failed - Valid encounter within last 6 months    Recent Outpatient Visits           7 months ago Acute bronchitis, unspecified organism   [place] [facility], Lauris Poag, MD   9 months ago Type 2 diabetes mellitus with other skin ulcer, with Unrein-term current use of insulin (HCC)   [place] [facility], Lauris Poag, MD   1 year ago Type 2 diabetes mellitus with other skin ulcer, with Folts-term current use of insulin (HCC)   [place] [facility] at [facility], MD   2 years ago Uncontrolled hypertension   [place] [facility] at [facility], MD   2 years ago Atrial fibrillation, unspecified type [facility])   [place] [facility] at [facility], MD       Future Appointments  In 1 month Georganna Skeans, MD [facility] at [facility]   In 1 month Alver Sorrow, NP [place] [facility] at [facility] [place]   In 4 months Chilton Si, MD [facility] at [facility], [place]            Passed - Cr in normal range and within 360 days    Creat  Date Value Ref Range Status  04/12/2016 1.01 0.60 - 1.35 mg/dL Final   Creatinine, Ser  Date Value Ref Range Status  02/14/2023 1.07 0.76 - 1.27 mg/dL Final         Passed - AST in normal range and within 360 days    AST  Date Value Ref Range Status  02/14/2023 15 0 - 40 IU/L Final         Passed - ALT in normal range and within 360 days    ALT  Date Value Ref Range Status  02/14/2023 13 0 - 44 IU/L Final         Passed - Last Heart Rate in normal range    Pulse Readings from Last 1 Encounters:  03/04/23 85

## 2023-03-04 NOTE — Progress Notes (Signed)
Cardiology Office Note:  .   Date:  03/04/2023  ID:  [name], DOB 07/10/1980, MRN [id number] PCP: Georganna Skeans, MD  [facility] Providers Cardiologist:  Chilton Si, MD Cardiology APP:  Alver Sorrow, NP    History of Present Illness: .   [name] is a 42 y.o. male with a hx of hx of chronic heart failure, hypertension, hyperlipidemia, diabetes, morbid obesity, OSA, venous insufficiency.   He was admitted 03/20/16-04/05/16 for acute heart failure. Echocardiogram was not interpretable due to body habitus. He was diuresed with IV Lasix with negative output of 47.2 L. H e was discharged from 631 pounds on admission to 488 pounds on discharge. His creatnine remained stable.  Seen 04/2018 for routine follow-up and lost to follow-up until July 2022.   Evaluated 10/06/20 with worsening LE edema, dyspnea on exertion. Metolazone 2.5mg  one tablet thirty minutes prior to Furosemide was started on Saturdays and Tuesdays. Seen in follow up 10/30/20 with myriad of concerns including LE edema with LRE in Unna boot, hand numbness, palpitations. Due to worsening renal function Metolazone was discontinued. At clinic 12/2020 he was transitioned from Lisinopril to [facility]. After visit 05/2021, sleep study revealed OSA.  Carotid duplex 03/2022 with no stenosis.  ED visit 08/30/2021 for angioedema after using Aveeno lotion around face and lips.  Was recommended to stop Entresto until cardiology follow-up.  Of note could not rule out allergic reaction to lotion.  Provided short course of prednisone as well as doxycycline due to RLE wound/ulcer   Seen 12/14/21 and Hydralazine increased to 50mg  BID and Ozempic added.   At visit 10/24/22 with BP elevated, recommended to resume Spironolactone as had run out. Encouraged to discuss increasing dose Ozempic with PCP.  Last seen 02/14/23 with weight gain of 39 lbs since clinic visit 4 months prior in setting of nonadherence to diuretic regimen and GLP1. LE  venous duplex study ordered and scheduled for 03/07/23 due to LE edema per his request (edema likely due to inconsistent diuretic use) and encouraged to schedule overdue follow up with [facility] and PCP. He was referred to hematology due to erthrocytosis.   Presents today for follow up. Weight up 6 lbs from clinic visit 3 weeks ago. Reports he has resumed taking Lasix twice per day and is making good urine. Reports home weight down 2-3 lbs. Has not been checking blood pressure routinely at home. Reports swelling is a little better. Has not been wearing CPAP - notes it is not having a good seal and provided him info for DME to get new mask.  ROS: Please see the history of present illness.    All other systems reviewed and are negative.   Studies Reviewed: .        Cardiac Studies & Procedures      ECHOCARDIOGRAM  ECHOCARDIOGRAM COMPLETE 03/21/2016  Narrative *[place]* *[facility]* [address] [place], [place] [address] [phone]  ------------------------------------------------------------------- Transthoracic Echocardiography  Patient:    Shawn, [name] MR #:       [id number] Study Date: 03/21/2016 Gender:     M Age:        35 Height:     190.5 cm Weight:     273.5 kg BSA:        3.98 m^2 Pt. Status: Room:       [address]  ADMITTING    Leavy Cella     [place], [place] N REFERRING    [place], [place]  N SONOGRAPHER  Nolon Rod, RDCS ATTENDING    Joseph Art PERFORMING   [facility], Inpatient  cc:  -------------------------------------------------------------------  ------------------------------------------------------------------- Indications:      Dyspnea 786.09.  ------------------------------------------------------------------- History:   Risk factors:  Hypertension. Morbidly obese.  ------------------------------------------------------------------- Impressions:  - Uninterpretable study, even after Definity  contrast administration.  ------------------------------------------------------------------- Study data:   Study status:  Routine.  Procedure:  Transthoracic echocardiography. Image quality was suboptimal. The study was technically difficult, as a result of poor acoustic windows, poor sound wave transmission, restricted patient mobility, and body habitus. Intravenous contrast (Definity) was administered.  Study completion:  There were no complications.          Transthoracic echocardiography.  M-mode, complete 2D, spectral Doppler, and color Doppler.  Birthdate:  Patient birthdate: Feb 26, 1981.  Age:  Patient is 42 yr old.  Sex:  Gender: male.    BMI: 75.4 kg/m^2.  Blood pressure:     154/79  Patient status:  Inpatient.  Study date: Study date: 03/21/2016. Study time: 09:14 AM.  Location:  Bedside.  -------------------------------------------------------------------  ------------------------------------------------------------------- Prepared and Electronically Authenticated by  Thurmon Fair, MD 2018-01-11T13:38:31             Risk Assessment/Calculations:        STOP-Bang Score:         Physical Exam:   VS:  BP (!) 154/94   Pulse 85   Ht 6' (1.829 m)   Wt (!) 538 lb 4.8 oz (244.2 kg)   SpO2 97%   BMI 73.01 kg/m    Wt Readings from Last 3 Encounters:  03/04/23 (!) 538 lb 4.8 oz (244.2 kg)  02/14/23 (!) 532 lb (241.3 kg)  10/24/22 (!) 493 lb (223.6 kg)    GEN: Well nourished, overweight, well developed in no acute distress NECK: No JVD; No carotid bruits CARDIAC: RRR, no murmurs, rubs, gallops RESPIRATORY:  Clear to auscultation without rales, wheezing or rhonchi  ABDOMEN: Soft, non-tender, non-distended EXTREMITIES:  Bilateral LE with 2+ pitting edema; RLE in wrap. No deformity   ASSESSMENT AND PLAN: .    Chronic congestive heart failure-unknown systolic or diastolic.  Echo 03/2016 not very interpretable due to body habitus.  No ACR/ARB/ARNI due to previous  angioedema. Continue Carvedilol 25mg  BID, lasix 80mg  BID, Spironolactone 25mg  QD, Hydralazine 50mg  BID, Lasix 80mg  BID.  Again encouraged take Lasix as prescribed given 45 lb weight gain over last 4 months with 6 lbs over last 2 weeks. Rx Metolazone 2.5mg  30 minutes prior to Lasix to be taken Thursday and Monday. For improvement in volume status. Consider SGLT2i at follow up  Low sodium diet, fluid restriction <2L, and daily weights encouraged. Educated to contact our office for weight gain of 2 lbs overnight or 5 lbs in one week.  Discussed possible cardiac PET vs MRI for assessment of LVEF but would need weight <500 lbs, re-discus at follow up.  High risk for admission, persistent weight gain consistent with fluid retention. Falling asleep during clinic visit today concerning given untreated OSA, as below.    HTN -BP not at goal <130/80 though improved from prior.  Short course metolazone as above.  Hopeful improvement of volume status will improve BP.  If BP not controlled at follow-up consider further increasing dose of hydralazine.   OSA- CPAP compliance encouraged.  Not using due to poor mask seal.  Provided him in the information for his DME company to stop and pick up new mask.  Anticipate noncompliance with sleep apnea is  contributory to worsening heart failure symptoms as well as erythrocytosis. Emphasized need to resume CPAP.   LE edema / Venous insufficiency - Reports RLE ulcer (covered with leg wrap at time of office visit). LE venous study upcoming later this week and again encouraged to schedule overdue follow up with VVS. Anticipate majority of LE edema exacerbated by nonadherence to diuretic regimen.   Morbid obesity / BMI 73 - Weight loss via diet and exercise encouraged. Discussed the impact being overweight would have on cardiovascular risk. Encouraged to resume Ozempic. Presently not taking due to copay, anticipate due to deductible - encouraged him to discuss with  pharmacy/insurance.        Dispo: follow up in 1 month with Alver Sorrow, NP with with Dr. Duke Salvia in 4 months  Signed, Alver Sorrow, NP

## 2023-03-04 NOTE — Patient Instructions (Addendum)
Medication Instructions:  Your physician has recommended you make the following change in your medication:  START Metolazone 2.5 mg 30 MINUTES BEFORE LASIX FOR 2 DOSES. CAN START THURS-FRI OR MON-TUE  *If you need a refill on your cardiac medications before your next appointment, please call your pharmacy*   Follow-Up: At [facility], you and your health needs are our priority.  As part of our continuing mission to provide you with exceptional heart care, we have created designated Provider Care Teams.  These Care Teams include your primary Cardiologist (physician) and Advanced Practice Providers (APPs -  Physician Assistants and Nurse Practitioners) who all work together to provide you with the care you need, when you need it.  We recommend signing up for the patient portal called "MyChart".  Sign up information is provided on this After Visit Summary.  MyChart is used to connect with patients for Virtual Visits (Telemedicine).  Patients are able to view lab/test results, encounter notes, upcoming appointments, etc.  Non-urgent messages can be sent to your provider as well.   To learn more about what you can do with MyChart, go to [email or web address].    Your next appointment:   1 month(s) with [name], NP 4 months with Dr. Duke Salvia  Other Instructions:  Recommend scheduling vascular appointment [phone]  Recommend resume using CPAP.  [facility] [address], [place], [place] [address] [phone]

## 2023-03-04 NOTE — Telephone Encounter (Signed)
Requested medication (s) are due for refill today - yes  Requested medication (s) are on the active medication list -ye  Future visit scheduled -yes  Last refill: 09/06/22 #90 1RF  Notes to clinic: Last refill request denied by provider as needs appointment- sent for review of request   Requested Prescriptions  Pending Prescriptions Disp Refills   gabapentin (NEURONTIN) 300 MG capsule 90 capsule 1     Neurology: Anticonvulsants - gabapentin Passed - 03/04/2023  2:15 PM      Passed - Cr in normal range and within 360 days    Creat  Date Value Ref Range Status  04/12/2016 1.01 0.60 - 1.35 mg/dL Final   Creatinine, Ser  Date Value Ref Range Status  02/14/2023 1.07 0.76 - 1.27 mg/dL Final         Passed - Completed PHQ-2 or PHQ-9 in the last 360 days      Passed - Valid encounter within last 12 months    Recent Outpatient Visits           7 months ago Acute bronchitis, unspecified organism   [place] [facility], Lauris Poag, MD   9 months ago Type 2 diabetes mellitus with other skin ulcer, with Hendryx-term current use of insulin (HCC)   [place] [facility], Lauris Poag, MD   1 year ago Type 2 diabetes mellitus with other skin ulcer, with Burnsed-term current use of insulin (HCC)   [place] [facility], MD   2 years ago Uncontrolled hypertension   [place] [facility] at [facility], Lauris Poag, MD   2 years ago Atrial fibrillation, unspecified type [facility])   [place] [facility], MD       Future Appointments             In 1 month Georganna Skeans, MD [facility] at [facility]   In 1 month Alver Sorrow, NP [place] [facility], [facility]   In 4 months Chilton Si, MD [facility] at [facility], [facility]               Requested Prescriptions  Pending Prescriptions Disp  Refills   gabapentin (NEURONTIN) 300 MG capsule 90 capsule 1     Neurology: Anticonvulsants - gabapentin Passed - 03/04/2023  2:15 PM      Passed - Cr in normal range and within 360 days    Creat  Date Value Ref Range Status  04/12/2016 1.01 0.60 - 1.35 mg/dL Final   Creatinine, Ser  Date Value Ref Range Status  02/14/2023 1.07 0.76 - 1.27 mg/dL Final         Passed - Completed PHQ-2 or PHQ-9 in the last 360 days      Passed - Valid encounter within last 12 months    Recent Outpatient Visits           7 months ago Acute bronchitis, unspecified organism   [facility], Lauris Poag, MD   9 months ago Type 2 diabetes mellitus with other skin ulcer, with [place]ot[place]-term current use of insulin (HCC)   [place] [facility] at [facility], Lauris Poag, MD   1 year ago Type 2 diabetes mellitus with other skin ulcer, with [place]-term current use of insulin [facility])    [facility] at [facility],  Lauris Poag, MD   2 years ago Uncontrolled hypertension   [place] [facility] at [facility], MD   2 years ago Atrial fibrillation, unspecified type [facility])   [place] [facility] at [facility], MD       Future Appointments             In 1 month Georganna Skeans, MD [facility] Primary Care at [facility]   In 1 month Dan Humphreys, Storm Frisk, NP [place] [facility] at [facility], [place]   In 4 months Chilton Si, MD [facility] Heart & [facility] at [facility], [place]

## 2023-03-04 NOTE — Telephone Encounter (Signed)
Requested Prescriptions  Pending Prescriptions Disp Refills   carvedilol (COREG) 25 MG tablet [Pharmacy Med Name: CARVEDILOL 25 MG TABLET] 180 tablet     Sig: TAKE 1 TABLET BY MOUTH TWICE A DAY     Cardiovascular: Beta Blockers 3 Failed - 03/04/2023  4:26 PM      Failed - Last BP in normal range    BP Readings from Last 1 Encounters:  03/04/23 (!) 154/94         Failed - Valid encounter within last 6 months    Recent Outpatient Visits           7 months ago Acute bronchitis, unspecified organism   [place] [facility], Lauris Poag, MD   9 months ago Type 2 diabetes mellitus with other skin ulcer, with Stolar-term current use of insulin (HCC)   [place] [facility] at [facility], Lauris Poag, MD   1 year ago Type 2 diabetes mellitus with other skin ulcer, with Dykema-term current use of insulin (HCC)   [place] [facility] at [facility], MD   2 years ago Uncontrolled hypertension   [place] [facility] at [facility], Lauris Poag, MD   2 years ago Atrial fibrillation, unspecified type [facility]   [place] [facility] at [facility], MD       Future Appointments             In 1 month Georganna Skeans, MD [facility] at [facility]   In 1 month Alver Sorrow, NP [place] [facility], [facility]   In 4 months Chilton Si, MD [facility] Heart & [facility] at [facility], [facility]            Passed - Cr in normal range and within 360 days    Creat  Date Value Ref Range Status  04/12/2016 1.01 0.60 - 1.35 mg/dL Final   Creatinine, Ser  Date Value Ref Range Status  02/14/2023 1.07 0.76 - 1.27 mg/dL Final         Passed - AST in normal range and within 360 days    AST  Date Value Ref Range Status  02/14/2023 15 0 - 40 IU/L Final         Passed - ALT in normal range and within 360 days    ALT  Date Value Ref Range Status  02/14/2023 13 0 -  44 IU/L Final         Passed - Last Heart Rate in normal range    Pulse Readings from Last 1 Encounters:  03/04/23 85          gabapentin (NEURONTIN) 300 MG capsule [Pharmacy Med Name: GABAPENTIN 300 MG CAPSULE] 90 capsule 1    Sig: TAKE 1 CAPSULE BY MOUTH THREE TIMES A DAY     Neurology: Anticonvulsants - gabapentin Passed - 03/04/2023  4:26 PM      Passed - Cr in normal range and within 360 days    Creat  Date Value Ref Range Status  04/12/2016 1.01 0.60 - 1.35 mg/dL Final   Creatinine, Ser  Date Value Ref Range Status  02/14/2023 1.07 0.76 - 1.27 mg/dL Final         Passed - Completed PHQ-2 or PHQ-9 in the last 360 days      Passed - Valid encounter within last 12 months    Recent Outpatient Visits  7 months ago Acute bronchitis, unspecified organism   [facility] at [facility], Lauris Poag, MD   9 months ago Type 2 diabetes mellitus with other skin ulcer, with Kilcrease-term current use of insulin (HCC)   [place] [facility] at [facility], Lauris Poag, MD   1 year ago Type 2 diabetes mellitus with other skin ulcer, with [place]-term current use of insulin (HCC)   [place] [facility] at [facility], MD   2 years ago Uncontrolled hypertension   [place] [facility] at [facility] MD   2 years ago Atrial fibrillation, unspecified type [facility])   [place] [facility] at [facility], MD       Future Appointments             In 1 month Georganna Skeans, MD [facility] Health Primary Care at [facility]   In 1 month Dan Humphreys, Storm Frisk, NP  Heart & Vascular at [facility], [place]   In 4 months Chilton Si, MD [facility] Heart & Vascular at [facility], [place]

## 2023-03-07 ENCOUNTER — Encounter (HOSPITAL_BASED_OUTPATIENT_CLINIC_OR_DEPARTMENT_OTHER): Payer: Managed Care, Other (non HMO)

## 2023-03-07 ENCOUNTER — Encounter (HOSPITAL_BASED_OUTPATIENT_CLINIC_OR_DEPARTMENT_OTHER): Payer: Self-pay

## 2023-03-11 ENCOUNTER — Telehealth: Payer: Self-pay | Admitting: Family Medicine

## 2023-03-13 NOTE — Telephone Encounter (Signed)
 Provider has papers at her desk. Awaiting signature

## 2023-03-24 ENCOUNTER — Encounter: Payer: Self-pay | Admitting: Oncology

## 2023-03-24 ENCOUNTER — Inpatient Hospital Stay: Payer: Managed Care, Other (non HMO)

## 2023-03-24 ENCOUNTER — Telehealth: Payer: Self-pay

## 2023-03-24 ENCOUNTER — Inpatient Hospital Stay: Payer: Managed Care, Other (non HMO) | Attending: Oncology | Admitting: Oncology

## 2023-03-24 VITALS — BP 145/83 | HR 77 | Temp 98.3°F | Resp 20 | Ht 72.0 in | Wt >= 6400 oz

## 2023-03-24 DIAGNOSIS — D751 Secondary polycythemia: Secondary | ICD-10-CM

## 2023-03-24 DIAGNOSIS — Z86718 Personal history of other venous thrombosis and embolism: Secondary | ICD-10-CM | POA: Insufficient documentation

## 2023-03-24 DIAGNOSIS — Z800 Family history of malignant neoplasm of digestive organs: Secondary | ICD-10-CM | POA: Insufficient documentation

## 2023-03-24 DIAGNOSIS — G4733 Obstructive sleep apnea (adult) (pediatric): Secondary | ICD-10-CM | POA: Insufficient documentation

## 2023-03-24 DIAGNOSIS — Z862 Personal history of diseases of the blood and blood-forming organs and certain disorders involving the immune mechanism: Secondary | ICD-10-CM | POA: Insufficient documentation

## 2023-03-24 LAB — IRON AND TIBC
Iron: 40 ug/dL — ABNORMAL LOW (ref 45–182)
Saturation Ratios: 11 % — ABNORMAL LOW (ref 17.9–39.5)
TIBC: 350 ug/dL (ref 250–450)
UIBC: 310 ug/dL

## 2023-03-24 LAB — FERRITIN: Ferritin: 83 ng/mL (ref 24–336)

## 2023-03-24 LAB — CBC WITH DIFFERENTIAL/PLATELET
Abs Immature Granulocytes: 0.01 10*3/uL (ref 0.00–0.07)
Basophils Absolute: 0 10*3/uL (ref 0.0–0.1)
Basophils Relative: 1 %
Eosinophils Absolute: 0.2 10*3/uL (ref 0.0–0.5)
Eosinophils Relative: 3 %
HCT: 40 % (ref 39.0–52.0)
Hemoglobin: 12.4 g/dL — ABNORMAL LOW (ref 13.0–17.0)
Immature Granulocytes: 0 %
Lymphocytes Relative: 20 %
Lymphs Abs: 1.2 10*3/uL (ref 0.7–4.0)
MCH: 24.4 pg — ABNORMAL LOW (ref 26.0–34.0)
MCHC: 31 g/dL (ref 30.0–36.0)
MCV: 78.6 fL — ABNORMAL LOW (ref 80.0–100.0)
Monocytes Absolute: 0.5 10*3/uL (ref 0.1–1.0)
Monocytes Relative: 8 %
Neutro Abs: 4.3 10*3/uL (ref 1.7–7.7)
Neutrophils Relative %: 68 %
Platelets: 238 10*3/uL (ref 150–400)
RBC: 5.09 MIL/uL (ref 4.22–5.81)
RDW: 16.3 % — ABNORMAL HIGH (ref 11.5–15.5)
WBC: 6.2 10*3/uL (ref 4.0–10.5)
nRBC: 0 % (ref 0.0–0.2)

## 2023-03-24 LAB — COMPREHENSIVE METABOLIC PANEL
ALT: 10 U/L (ref 0–44)
AST: 13 U/L — ABNORMAL LOW (ref 15–41)
Albumin: 4.2 g/dL (ref 3.5–5.0)
Alkaline Phosphatase: 50 U/L (ref 38–126)
Anion gap: 6 (ref 5–15)
BUN: 17 mg/dL (ref 6–20)
CO2: 30 mmol/L (ref 22–32)
Calcium: 9.6 mg/dL (ref 8.9–10.3)
Chloride: 101 mmol/L (ref 98–111)
Creatinine, Ser: 1.22 mg/dL (ref 0.61–1.24)
GFR, Estimated: >60 mL/min (ref >60)
Glucose, Bld: 110 mg/dL — ABNORMAL HIGH (ref 70–99)
Potassium: 3.7 mmol/L (ref 3.5–5.1)
Sodium: 137 mmol/L (ref 135–145)
Total Bilirubin: 0.4 mg/dL (ref 0.0–1.2)
Total Protein: 8.3 g/dL — ABNORMAL HIGH (ref 6.5–8.1)

## 2023-03-24 LAB — LACTATE DEHYDROGENASE: LDH: 199 U/L — ABNORMAL HIGH (ref 98–192)

## 2023-03-24 NOTE — Assessment & Plan Note (Signed)
 Contributes to elevated red cell count due to intermittent hypoxia. Uses CPAP. - Continue CPAP use

## 2023-03-24 NOTE — Progress Notes (Signed)
 [facility]  HEMATOLOGY CLINIC CONSULTATION NOTE    PATIENT NAME: [name]   MR#: [id number] DOB: [birth year]  DATE OF SERVICE: 03/24/2023   REFERRING PHYSICIAN  Patient Care Team: Tanda Bleacher, MD as PCP - General (Family Medicine) Raford Riggs, MD as PCP - Cardiology (Cardiology) Vannie Reche RAMAN, NP as Nurse Practitioner (Cardiology)    REASON FOR CONSULTATION/ CHIEF COMPLAINT: Erythrocytosis  ASSESSMENT & PLAN:  [name] is a 43 y.o. gentleman with past medical history of morbid obesity, OSA on CPAP, hypertension, chronic venous insufficiency, lymphedema, hx of DVT in 2000s, was referred to our clinic for evaluation of elevated hemoglobin.  Polycythemia On 02/14/2023 he was found to have elevated hemoglobin (18.1) and hematocrit (58.8).  Polycythemia likely secondary to sleep apnea and also contributed by obesity hypoventilation syndrome. Differential includes bone marrow mutation.   -Most likely in his case he had secondary polycythemia from OSA.  -Discussed risks of high red count including clots, strokes, and heart problems. Therapeutic phlebotomy involves removing a pint of blood to maintain hematocrit below 55, or below 45 if mutation is present.  -Labs today actually showed improved hemoglobin at 12.4, hematocrit 40, MCV 78.6.  White count and platelet count are also normal.  We did submit request for iron studies, JAK2 mutation analysis prior to CBC results.  Will follow-up on these results.  - Arrange therapeutic phlebotomy if hematocrit remains consistently above 55 to avoid hyperviscosity related complications.  - Schedule follow-up in four weeks for repeat blood work.  If blood counts are stable, we will space out clinic intervals.  History of DVT of lower extremity Treated with Coumadin in the early 2000s. - Monitor for signs of thrombosis given current polycythemia  OSA (obstructive sleep apnea) Contributes to elevated red  cell count due to intermittent hypoxia. Uses CPAP. - Continue CPAP use   I reviewed lab results and outside records for this visit and discussed relevant results with the patient. Diagnosis, plan of care and treatment options were also discussed in detail with the patient. Opportunity provided to ask questions and answers provided to his apparent satisfaction. Provided instructions to call our clinic with any problems, questions or concerns prior to return visit. I recommended to continue follow-up with PCP and sub-specialists. He verbalized understanding and agreed with the plan. No barriers to learning was detected.  Shawn Patten, MD  03/24/2023 3:51 PM  [facility] A DE[facility] OF [facility] [address] [place] [address] Dept: [phone] Dept Fax: [phone]   HISTORY OF PRESENTING ILLNESS:   Discussed the use of AI scribe software for clinical note transcription with the patient, who gave verbal consent to proceed.   He was found to have abnormal CBC from 02/14/2023.  His hemoglobin was 18.1, hematocrit 58.8, MCV 81.  White count was 10,500.  No differential was obtained.  Platelet count was normal at 378,000.  He was referred to us  for further evaluation of polycythemia.  This was the first time the patient was informed of having a high red blood cell count. Previous lab results showed the patient's red blood cell count as sometimes low, but mostly normal. The recent lab results showed a hemoglobin level of 18.1 (normal up to 17) and a hematocrit level of 58.8. The patient has not been on any testosterone injections and does not smoke. The patient also has a history of ulcers in the legs, which has  led to a fluid restriction.  He denies intermittent headaches, shortness of breath on exertion, dizziness, frequent leg cramps or chest pain.   He has a hx of blood clot in the leg in early 2000s. He was on  coumadin at that time and took it for up to an year.   He has prior diagnosis of obstructive sleep apnea and claims to be compliant with CPAP.  MEDICAL HISTORY:  Past Medical History:  Diagnosis Date   Chronic venous insufficiency    a. as a premature infant, required venous access for care with subsequent DVT and some sort of procedure -> eventually went on to have vein bypass around 2005.   Hypertension    Lymphedema    Morbid obesity (HCC)    OSA on CPAP     SURGICAL HISTORY: Past Surgical History:  Procedure Laterality Date   balloon stenting Right    right leg   VEIN LIGATION AND STRIPPING      SOCIAL HISTORY: Social History   Socioeconomic History   Marital status: Single    Spouse name: Not on file   Number of children: Not on file   Years of education: Not on file   Highest education level: Not on file  Occupational History   Not on file  Tobacco Use   Smoking status: Never   Smokeless tobacco: Never  Substance and Sexual Activity   Alcohol use: Yes    Alcohol/week: 4.0 standard drinks of alcohol    Types: 4 Standard drinks or equivalent per week    Comment: liquor - pt states once a week   Drug use: No   Sexual activity: Not on file  Other Topics Concern   Not on file  Social History Narrative   Not on file   Social Drivers of Health   Financial Resource Strain: Not on file  Food Insecurity: No Food Insecurity (03/24/2023)   Hunger Vital Sign    Worried About Running Out of Food in the Last Year: Never true    Ran Out of Food in the Last Year: Never true  Transportation Needs: No Transportation Needs (03/24/2023)   PRAPARE - Administrator, Civil Service (Medical): No    Lack of Transportation (Non-Medical): No  Physical Activity: Not on file  Stress: Not on file  Social Connections: Unknown (11/28/2022)   Received from [facility]   Social Network    Social Network: Not on file  Intimate Partner Violence: Not At Risk (03/24/2023)    Humiliation, Afraid, Rape, and Kick questionnaire    Fear of Current or Ex-Partner: No    Emotionally Abused: No    Physically Abused: No    Sexually Abused: No    FAMILY HISTORY: Family History  Problem Relation Age of Onset   Diabetes Mother    Cancer Father        colon   Cancer Maternal Grandmother    Cancer Maternal Grandfather     CURRENT MEDICATIONS   Current Outpatient Medications  Medication Instructions   Accu-Chek Softclix Lancets lancets Use as instructed   Blood Glucose Monitoring Suppl (ACCU-CHEK GUIDE ME) w/Device KIT USE UP TO 4 TIMES A DAY AS DIRECTED   carvedilol  (COREG ) 25 mg, Oral, 2 times daily   EPINEPHrine  (EPI-PEN) 0.3 mg, Intramuscular, As needed   furosemide  (LASIX ) 80 mg, Oral, 2 times daily   gabapentin  (NEURONTIN ) 300 MG capsule TAKE 1 CAPSULE BY MOUTH THREE TIMES A DAY   glucose blood (ACCU-CHEK  GUIDE) test strip Use as instructed   hydrALAZINE  (APRESOLINE ) 50 MG tablet TAKE 1 TABLET (50 MG) BY MOUTH IN THE MORNING AND AT BEDTIME   metFORMIN  (GLUCOPHAGE ) 500 MG tablet TAKE 1 TABLET BY MOUTH EVERY DAY WITH BREAKFAST   metolazone  (ZAROXOLYN ) 2.5 mg, Oral, Daily   Semaglutide  (2 MG/DOSE) 2 mg, Injection, Weekly   spironolactone  (ALDACTONE ) 25 mg, Oral, Daily   traMADol  (ULTRAM ) 50 mg, Oral, Every 6 hours PRN     ALLERGIES  He is allergic to entresto  [sacubitril -valsartan ], lidocaine , other, peanut-containing drug products, hydrofera blue 4x4 [wound dressings], and latex.  REVIEW OF SYSTEMS:    Review of Systems - Oncology  All of the other pertinent systems were reviewed with the patient and were unremarkable except as mentioned above in HPI.  PHYSICAL EXAMINATION:  ECOG PERFORMANCE STATUS: 2 - Symptomatic, <50% confined to bed  Vitals:   03/24/23 1420  BP: (!) 145/83  Pulse: 77  Resp: 20  Temp: 98.3 F (36.8 C)  SpO2: 94%   Filed Weights   03/24/23 1420  Weight: (!) 506 lb 1.6 oz (229.6 kg)    Physical Exam Constitutional:       General: He is not in acute distress.    Appearance: He is obese.  HENT:     Head: Normocephalic and atraumatic.  Eyes:     General: No scleral icterus.    Conjunctiva/sclera: Conjunctivae normal.  Cardiovascular:     Rate and Rhythm: Normal rate and regular rhythm.     Heart sounds: Normal heart sounds.  Pulmonary:     Effort: Pulmonary effort is normal.     Breath sounds: Normal breath sounds.  Abdominal:     Comments: Obese abdomen  Musculoskeletal:     Right lower leg: Edema present.     Left lower leg: Edema present.  Neurological:     Mental Status: He is alert and oriented to person, place, and time.  Psychiatric:        Mood and Affect: Mood normal.        Behavior: Behavior normal.     LABORATORY DATA:   I have reviewed the data as listed.  Results for orders placed or performed in visit on 03/24/23  CBC with Differential/Platelet  Result Value Ref Range   WBC 6.2 4.0 - 10.5 K/uL   RBC 5.09 4.22 - 5.81 MIL/uL   Hemoglobin 12.4 (L) 13.0 - 17.0 g/dL   HCT 59.9 60.9 - 47.9 %   MCV 78.6 (L) 80.0 - 100.0 fL   MCH 24.4 (L) 26.0 - 34.0 pg   MCHC 31.0 30.0 - 36.0 g/dL   RDW 83.6 (H) 88.4 - 84.4 %   Platelets 238 150 - 400 K/uL   nRBC 0.0 0.0 - 0.2 %   Neutrophils Relative % 68 %   Neutro Abs 4.3 1.7 - 7.7 K/uL   Lymphocytes Relative 20 %   Lymphs Abs 1.2 0.7 - 4.0 K/uL   Monocytes Relative 8 %   Monocytes Absolute 0.5 0.1 - 1.0 K/uL   Eosinophils Relative 3 %   Eosinophils Absolute 0.2 0.0 - 0.5 K/uL   Basophils Relative 1 %   Basophils Absolute 0.0 0.0 - 0.1 K/uL   Immature Granulocytes 0 %   Abs Immature Granulocytes 0.01 0.00 - 0.07 K/uL     RADIOGRAPHIC STUDIES:  No recent pertinent imaging available to review.  Orders Placed This Encounter  Procedures   CBC with Differential/Platelet    Standing Status:  Future    Number of Occurrences:   1    Expiration Date:   03/23/2024   Comprehensive metabolic panel    Standing Status:   Future     Number of Occurrences:   1    Expiration Date:   03/23/2024   Lactate dehydrogenase    Standing Status:   Future    Number of Occurrences:   1    Expiration Date:   03/23/2024   Iron and TIBC    Standing Status:   Future    Number of Occurrences:   1    Expiration Date:   03/23/2024   Ferritin    Standing Status:   Future    Number of Occurrences:   1    Expiration Date:   03/23/2024   JAK2 V617F rfx CALR/MPL/E12-15    Standing Status:   Future    Number of Occurrences:   1    Expiration Date:   03/23/2024    Future Appointments  Date Time Provider Department Center  04/03/2023  3:40 PM Tanda Bleacher, MD [facility] None  04/04/2023  3:00 PM [facility]ECHO/VAS [facility]CVIMG [facility]  04/11/2023  2:20 PM Vannie Reche RAMAN, NP [facility]CVD [facility]  04/21/2023  2:45 PM [facility]MEDONC PHLEBOTOMIST [facility] None  04/21/2023  3:00 PM Valecia Beske, Chinita, MD [facility] None  07/03/2023  4:00 PM Raford Riggs, MD [facility]     I spent a total of 55 minutes during this encounter with the patient including review of chart and various tests results, discussions about plan of care and coordination of care plan.   This document was completed utilizing speech recognition software. Grammatical errors, random word insertions, pronoun errors, and incomplete sentences are an occasional consequence of this system due to software limitations, ambient noise, and hardware issues. Any formal questions or concerns about the content, text or information contained within the body of this dictation should be directly addressed to the provider for clarification.

## 2023-03-24 NOTE — Telephone Encounter (Signed)
 Spoke with patient, per provider hematocrit is 40 and he does not need therapeutic phlebotomy at this time. Follow-up as scheduled, understood, no further questions.

## 2023-03-24 NOTE — Assessment & Plan Note (Addendum)
 On 02/14/2023 he was found to have elevated hemoglobin (18.1) and hematocrit (58.8).  Polycythemia likely secondary to sleep apnea and also contributed by obesity hypoventilation syndrome. Differential includes bone marrow mutation.   -Most likely in his case he had secondary polycythemia from OSA.  -Discussed risks of high red count including clots, strokes, and heart problems. Therapeutic phlebotomy involves removing a pint of blood to maintain hematocrit below 55, or below 45 if mutation is present.  -Labs today actually showed improved hemoglobin at 12.4, hematocrit 40, MCV 78.6.  White count and platelet count are also normal.  We did submit request for iron studies, JAK2 mutation analysis prior to CBC results.  Will follow-up on these results.  - Arrange therapeutic phlebotomy if hematocrit remains consistently above 55 to avoid hyperviscosity related complications.  - Schedule follow-up in four weeks for repeat blood work.  If blood counts are stable, we will space out clinic intervals.

## 2023-03-24 NOTE — Assessment & Plan Note (Signed)
 Treated with Coumadin in the early 2000s. - Monitor for signs of thrombosis given current polycythemia

## 2023-03-25 ENCOUNTER — Telehealth: Payer: Self-pay | Admitting: Family Medicine

## 2023-03-25 NOTE — Telephone Encounter (Signed)
 Lea w/ [facility] states she needs a call back about lymphoedema pump.  The pt's insurance is requiring additional information, and she needs to discuss w/ you asap  [phone]

## 2023-03-26 NOTE — Telephone Encounter (Signed)
 Call has been return and msg left for return call

## 2023-03-31 LAB — JAK2 V617F RFX CALR/MPL/E12-15

## 2023-03-31 LAB — CALR +MPL + E12-E15  (REFLEX)

## 2023-04-03 ENCOUNTER — Ambulatory Visit (INDEPENDENT_AMBULATORY_CARE_PROVIDER_SITE_OTHER): Payer: Managed Care, Other (non HMO) | Admitting: Family Medicine

## 2023-04-03 VITALS — BP 144/78 | HR 78 | Temp 98.0°F | Resp 24 | Ht 74.5 in | Wt >= 6400 oz

## 2023-04-03 DIAGNOSIS — I10 Essential (primary) hypertension: Secondary | ICD-10-CM

## 2023-04-03 DIAGNOSIS — E119 Type 2 diabetes mellitus without complications: Secondary | ICD-10-CM

## 2023-04-03 DIAGNOSIS — M25561 Pain in right knee: Secondary | ICD-10-CM | POA: Diagnosis not present

## 2023-04-03 DIAGNOSIS — E66813 Obesity, class 3: Secondary | ICD-10-CM

## 2023-04-03 DIAGNOSIS — Z7985 Long-term (current) use of injectable non-insulin antidiabetic drugs: Secondary | ICD-10-CM

## 2023-04-03 DIAGNOSIS — M25562 Pain in left knee: Secondary | ICD-10-CM

## 2023-04-03 DIAGNOSIS — E11622 Type 2 diabetes mellitus with other skin ulcer: Secondary | ICD-10-CM

## 2023-04-03 DIAGNOSIS — Z6841 Body Mass Index (BMI) 40.0 and over, adult: Secondary | ICD-10-CM

## 2023-04-03 DIAGNOSIS — Z794 Long term (current) use of insulin: Secondary | ICD-10-CM

## 2023-04-03 DIAGNOSIS — Z7984 Long term (current) use of oral hypoglycemic drugs: Secondary | ICD-10-CM

## 2023-04-03 MED ORDER — SEMAGLUTIDE (2 MG/DOSE) 8 MG/3ML ~~LOC~~ SOPN: 2.0000 mg | PEN_INJECTOR | SUBCUTANEOUS | 1 refills | Status: DC

## 2023-04-03 MED ORDER — TRAMADOL HCL 50 MG PO TABS: 50.0000 mg | ORAL_TABLET | Freq: Four times a day (QID) | ORAL | 0 refills | Status: DC | PRN

## 2023-04-04 ENCOUNTER — Ambulatory Visit (HOSPITAL_BASED_OUTPATIENT_CLINIC_OR_DEPARTMENT_OTHER): Payer: Managed Care, Other (non HMO)

## 2023-04-04 ENCOUNTER — Encounter: Payer: Self-pay | Admitting: Family Medicine

## 2023-04-04 DIAGNOSIS — I872 Venous insufficiency (chronic) (peripheral): Secondary | ICD-10-CM | POA: Diagnosis not present

## 2023-04-04 LAB — MICROALBUMIN / CREATININE URINE RATIO
Creatinine, Urine: 18.5 mg/dL
Microalb/Creat Ratio: 222 mg/g{creat} — ABNORMAL HIGH (ref 0–29)
Microalbumin, Urine: 41 ug/mL

## 2023-04-04 NOTE — Progress Notes (Addendum)
 Established Patient Office Visit  Subjective    Patient ID: [name], male    DOB: Dec 30, 1980  Age: 43 y.o. MRN: [id number]  CC:  Chief Complaint  Patient presents with   Medication Refill    Paperwork for a new leg pump    HPI DAKWON WENBERG presents for authorization paperwork completion for leg pumps. He also is for routine follow up of chronic med issues. He continues as well to have intermittent pain in his knees but denies acute complaints. Patient has also been trying compression, elevation, and exercises at home for weeks/months with no improvements. He hias done at least 4 weeks of conservative therapy.  Outpatient Encounter Medications as of 04/03/2023  Medication Sig   carvedilol (COREG) 25 MG tablet Take 1 tablet (25 mg total) by mouth 2 (two) times daily.   gabapentin (NEURONTIN) 300 MG capsule TAKE 1 CAPSULE BY MOUTH THREE TIMES A DAY   hydrALAZINE (APRESOLINE) 50 MG tablet TAKE 1 TABLET (50 MG) BY MOUTH IN THE MORNING AND AT BEDTIME   metFORMIN (GLUCOPHAGE) 500 MG tablet TAKE 1 TABLET BY MOUTH EVERY DAY WITH BREAKFAST   spironolactone (ALDACTONE) 25 MG tablet TAKE 1 TABLET (25 MG TOTAL) BY MOUTH DAILY.   Accu-Chek Softclix Lancets lancets Use as instructed (Patient not taking: Reported on 03/24/2023)   Blood Glucose Monitoring Suppl (ACCU-CHEK GUIDE ME) w/Device KIT USE UP TO 4 TIMES A DAY AS DIRECTED (Patient not taking: Reported on 03/24/2023)   EPINEPHrine 0.3 mg/0.3 mL IJ SOAJ injection Inject 0.3 mg into the muscle as needed for anaphylaxis. (Patient not taking: Reported on 03/24/2023)   furosemide (LASIX) 80 MG tablet Take 1 tablet (80 mg total) by mouth 2 (two) times daily.   glucose blood (ACCU-CHEK GUIDE) test strip Use as instructed (Patient not taking: Reported on 03/24/2023)   metolazone (ZAROXOLYN) 2.5 MG tablet Take 1 tablet (2.5 mg total) by mouth daily for 2 doses.   Semaglutide, 2 MG/DOSE, 8 MG/3ML SOPN Inject 2 mg as directed once a week.   traMADol  (ULTRAM) 50 MG tablet Take 1 tablet (50 mg total) by mouth every 6 (six) hours as needed for moderate pain (pain score 4-6).   [DISCONTINUED] Semaglutide, 2 MG/DOSE, 8 MG/3ML SOPN Inject 2 mg as directed once a week. (Patient not taking: Reported on 03/24/2023)   [DISCONTINUED] traMADol (ULTRAM) 50 MG tablet Take 1 tablet (50 mg total) by mouth every 6 (six) hours as needed for moderate pain. (Patient not taking: Reported on 03/24/2023)   No facility-administered encounter medications on file as of 04/03/2023.    Past Medical History:  Diagnosis Date   Chronic venous insufficiency    a. as a premature infant, required venous access for care with subsequent DVT and some sort of procedure -> eventually went on to have vein bypass around 2005.   Hypertension    Lymphedema    Morbid obesity (HCC)    OSA on CPAP     Past Surgical History:  Procedure Laterality Date   balloon stenting Right    right leg   VEIN LIGATION AND STRIPPING      Family History  Problem Relation Age of Onset   Diabetes Mother    Cancer Father        colon   Cancer Maternal Grandmother    Cancer Maternal Grandfather     Social History   Socioeconomic History   Marital status: Single    Spouse name: Not on file  Number of children: Not on file   Years of education: Not on file   Highest education level: Master's degree (e.g., MA, MS, MEng, MEd, MSW, MBA)  Occupational History   Not on file  Tobacco Use   Smoking status: Never   Smokeless tobacco: Never  Substance and Sexual Activity   Alcohol use: Yes    Alcohol/week: 4.0 standard drinks of alcohol    Types: 4 Standard drinks or equivalent per week    Comment: liquor - pt states once a week   Drug use: No   Sexual activity: Not on file  Other Topics Concern   Not on file  Social History Narrative   Not on file   Social Drivers of Health   Financial Resource Strain: Low Risk  (04/03/2023)   Overall Financial Resource Strain (CARDIA)     Difficulty of Paying Living Expenses: Not very hard  Food Insecurity: No Food Insecurity (04/03/2023)   Hunger Vital Sign    Worried About Running Out of Food in the Last Year: Never true    Ran Out of Food in the Last Year: Never true  Transportation Needs: No Transportation Needs (04/03/2023)   PRAPARE - Administrator, Civil Service (Medical): No    Lack of Transportation (Non-Medical): No  Physical Activity: Sufficiently Active (04/03/2023)   Exercise Vital Sign    Days of Exercise per Week: 4 days    Minutes of Exercise per Session: 40 min  Stress: No Stress Concern Present (04/03/2023)   [employer] of Occupational Health - Occupational Stress Questionnaire    Feeling of Stress : Only a little  Social Connections: Socially Integrated (04/03/2023)   Social Connection and Isolation Panel [NHANES]    Frequency of Communication with Friends and Family: More than three times a week    Frequency of Social Gatherings with Friends and Family: Once a week    Attends Religious Services: More than 4 times per year    Active Member of [employer] or Organizations: Yes    Attends Banker Meetings: More than 4 times per year    Marital Status: Married  Catering manager Violence: Not At Risk (03/24/2023)   Humiliation, Afraid, Rape, and Kick questionnaire    Fear of Current or Ex-Partner: No    Emotionally Abused: No    Physically Abused: No    Sexually Abused: No    Review of Systems  All other systems reviewed and are negative.       Objective    BP (!) 144/78 (BP Location: Right Arm, Patient Position: Sitting, Cuff Size: Large)   Pulse 78   Temp 98 F (36.7 C) (Oral)   Resp (!) 24   Ht 6' 2.5" (1.892 m)   Wt (!) 501 lb 12.8 oz (227.6 kg)   SpO2 96%   BMI 63.57 kg/m   Physical Exam Vitals and nursing note reviewed.  Constitutional:      General: He is not in acute distress.    Appearance: He is obese.  Cardiovascular:     Rate and Rhythm: Normal  rate and regular rhythm.  Pulmonary:     Effort: Pulmonary effort is normal.     Breath sounds: Normal breath sounds.  Abdominal:     Palpations: Abdomen is soft.     Tenderness: There is no abdominal tenderness.  Musculoskeletal:     Right lower leg: Edema present.     Left lower leg: Edema present.     Comments:  Bilateral legs with hyperpigmentation and weeping ulcerated lesions.   Neurological:     General: No focal deficit present.     Mental Status: He is alert and oriented to person, place, and time.         Assessment & Plan:  1. Type 2 diabetes mellitus with other skin ulcer, with Balboa-term current use of insulin (HCC) (Primary) Recent A1c at gol - continue  - Urine Albumin/Creatinine with ratio (send out) [LAB689]  2. Class 3 severe obesity due to excess calories with serious comorbidity and body mass index (BMI) of 60.0 to 69.9 in adult (HCC)   3. Essential hypertension Slightly elevated readings.   4. Pain in both knees, unspecified chronicity Tramadol refilled  5. Gehling-term current use of injectable noninsulin antidiabetic medication   6. Diabetes mellitus treated with oral medication (HCC)     Return in about 3 months (around 07/02/2023) for follow up, chronic med issues.   Tommie Raymond, MD

## 2023-04-07 ENCOUNTER — Encounter (HOSPITAL_BASED_OUTPATIENT_CLINIC_OR_DEPARTMENT_OTHER): Payer: Self-pay

## 2023-04-07 ENCOUNTER — Encounter: Payer: Self-pay | Admitting: Family Medicine

## 2023-04-10 ENCOUNTER — Other Ambulatory Visit (HOSPITAL_BASED_OUTPATIENT_CLINIC_OR_DEPARTMENT_OTHER): Payer: Self-pay | Admitting: Family

## 2023-04-10 ENCOUNTER — Other Ambulatory Visit: Payer: Self-pay | Admitting: Family Medicine

## 2023-04-10 DIAGNOSIS — I10 Essential (primary) hypertension: Secondary | ICD-10-CM

## 2023-04-10 DIAGNOSIS — I509 Heart failure, unspecified: Secondary | ICD-10-CM

## 2023-04-10 DIAGNOSIS — I5042 Chronic combined systolic (congestive) and diastolic (congestive) heart failure: Secondary | ICD-10-CM

## 2023-04-11 ENCOUNTER — Ambulatory Visit (INDEPENDENT_AMBULATORY_CARE_PROVIDER_SITE_OTHER): Payer: Managed Care, Other (non HMO) | Admitting: Family

## 2023-04-11 ENCOUNTER — Encounter (HOSPITAL_BASED_OUTPATIENT_CLINIC_OR_DEPARTMENT_OTHER): Payer: Self-pay | Admitting: Family

## 2023-04-11 DIAGNOSIS — E1165 Type 2 diabetes mellitus with hyperglycemia: Secondary | ICD-10-CM

## 2023-04-11 DIAGNOSIS — I872 Venous insufficiency (chronic) (peripheral): Secondary | ICD-10-CM

## 2023-04-11 DIAGNOSIS — I10 Essential (primary) hypertension: Secondary | ICD-10-CM | POA: Diagnosis not present

## 2023-04-11 DIAGNOSIS — I509 Heart failure, unspecified: Secondary | ICD-10-CM

## 2023-04-11 DIAGNOSIS — E782 Mixed hyperlipidemia: Secondary | ICD-10-CM

## 2023-04-11 DIAGNOSIS — I5042 Chronic combined systolic (congestive) and diastolic (congestive) heart failure: Secondary | ICD-10-CM

## 2023-04-11 DIAGNOSIS — G4733 Obstructive sleep apnea (adult) (pediatric): Secondary | ICD-10-CM

## 2023-04-11 DIAGNOSIS — Z6841 Body Mass Index (BMI) 40.0 and over, adult: Secondary | ICD-10-CM

## 2023-04-11 MED ORDER — DAPAGLIFLOZIN PROPANEDIOL 10 MG PO TABS: 10.0000 mg | ORAL_TABLET | Freq: Every day | ORAL | 0 refills | Status: DC

## 2023-04-11 MED ORDER — DAPAGLIFLOZIN PROPANEDIOL 10 MG PO TABS: 10.0000 mg | ORAL_TABLET | Freq: Every day | ORAL | 11 refills | Status: DC

## 2023-04-11 MED ORDER — FUROSEMIDE 80 MG PO TABS: 80.0000 mg | ORAL_TABLET | Freq: Two times a day (BID) | ORAL | 3 refills | Status: DC

## 2023-04-11 MED ORDER — SPIRONOLACTONE 25 MG PO TABS: 25.0000 mg | ORAL_TABLET | Freq: Every day | ORAL | 3 refills | Status: DC

## 2023-04-11 NOTE — Patient Instructions (Addendum)
Medication Instructions:  Your physician has recommended you make the following change in your medication:   START Farxiga 10 mg daily  *If you need a refill on your cardiac medications before your next appointment, please call your pharmacy*  Labs: Your physician recommends that you return for lab work in 1-2 weeks: BMP  Follow-Up: At [facility], you and your health needs are our priority.  As part of our continuing mission to provide you with exceptional heart care, we have created designated Provider Care Teams.  These Care Teams include your primary Cardiologist (physician) and Advanced Practice Providers (APPs -  Physician Assistants and Nurse Practitioners) who all work together to provide you with the care you need, when you need it.  We recommend signing up for the patient portal called "MyChart".  Sign up information is provided on this After Visit Summary.  MyChart is used to connect with patients for Virtual Visits (Telemedicine).  Patients are able to view lab/test results, encounter notes, upcoming appointments, etc.  Non-urgent messages can be sent to your provider as well.   To learn more about what you can do with MyChart, go to [email or web address].    Your next appointment:   Follow up as scheduled  Other Instructions WE HAVE REFERRED YOU TO VASCULAR CLINIC

## 2023-04-11 NOTE — Progress Notes (Unsigned)
Cardiology Office Note:  .   Date:  04/11/2023  ID:  [name] [name] [name], DOB 1980/08/19, MRN [id number] PCP: Georganna Skeans, MD  [facility] Providers Cardiologist:  Chilton Si, MD Cardiology APP:  Alver Sorrow, NP    History of Present Illness: .   [name] [name] [name] is a 43 y.o. male with a hx of hx of chronic heart failure, hypertension, hyperlipidemia, diabetes, morbid obesity, OSA, venous insufficiency.   He was admitted 03/20/16-04/05/16 for acute heart failure. Echocardiogram was not interpretable due to body habitus. He was diuresed with IV Lasix with negative output of 47.2 L. H e was discharged from 631 pounds on admission to 488 pounds on discharge. His creatnine remained stable.  Seen 04/2018 for routine follow-up and lost to follow-up until July 2022.   Evaluated 10/06/20 with worsening LE edema, dyspnea on exertion. Metolazone 2.5mg  one tablet thirty minutes prior to Furosemide was started on Saturdays and Tuesdays. Seen in follow up 10/30/20 with myriad of concerns including LE edema with LRE in Unna boot, hand numbness, palpitations. Due to worsening renal function Metolazone was discontinued. At clinic 12/2020 he was transitioned from Lisinopril to [facility]. After visit 05/2021, sleep study revealed OSA.  Carotid duplex 03/2022 with no stenosis.  ED visit 08/30/2021 for angioedema after using Aveeno lotion around face and lips.  Was recommended to stop Entresto until cardiology follow-up.  Of note could not rule out allergic reaction to lotion.  Provided short course of prednisone as well as doxycycline due to RLE wound/ulcer   Seen 12/14/21 and Hydralazine increased to 50mg  BID and Ozempic added.   At visit 10/24/22 with BP elevated, recommended to resume Spironolactone as had run out. Encouraged to discuss increasing dose Ozempic with PCP.  Last seen 02/14/23 with weight gain of 39 lbs since clinic visit 4 months prior in setting of nonadherence to diuretic regimen and GLP1. LE  venous duplex study ordered and scheduled for 03/07/23 due to LE edema per his request (edema likely due to inconsistent diuretic use) and encouraged to schedule overdue follow up with [facility] and PCP. He was referred to hematology due to erthrocytosis.   Presents today for follow up. Reports his right leg is aching this week in his left calf/shin. Reports no improvement with gabapentin. Has not scheduled his overdue vascular follow up. Also notes his RLE wound is not progressing, wife has been wrapping at home, he is very hesitant to return to wound clinic. He reports weight at home has been stable only fluctuating one pound. Breathing has been "fine" by his report and better since doing a few days of Metolazone. His abdominal swelling has been improved.  He is "slowly" returning to using his CPAP about 3 hours each night, had to get new mask. BP at home 125-130s. Notes he has been out of Spironolactone for 2 days. No orthopnea, PND, chest pain. Weight from 538 ? 503 lbs since last clinic visit with diuresis with Metolazone x 2.   ROS: Please see the history of present illness.    All other systems reviewed and are negative.   Studies Reviewed: .        Cardiac Studies & Procedures      ECHOCARDIOGRAM  ECHOCARDIOGRAM COMPLETE 03/21/2016  Narrative *[place]* *[facility]* [address] [place], [place] [address] [phone]  ------------------------------------------------------------------- Transthoracic Echocardiography  Patient:    [name], [name] [name] MR #:       [id number] Study Date: 03/21/2016 Gender:  M Age:        35 Height:     190.5 cm Weight:     273.5 kg BSA:        3.98 m^2 Pt. Status: Room:       [address]    Leavy Cella     Mondovi, Meryle Ready REFERRING    [place], [place] SONOGRAPHER  Nolon Rod, RDCS ATTENDING    Joseph Art PERFORMING   [facility],  Inpatient  cc:  -------------------------------------------------------------------  ------------------------------------------------------------------- Indications:      Dyspnea 786.09.  ------------------------------------------------------------------- History:   Risk factors:  Hypertension. Morbidly obese.  ------------------------------------------------------------------- Impressions:  - Uninterpretable study, even after Definity contrast administration.  ------------------------------------------------------------------- Study data:   Study status:  Routine.  Procedure:  Transthoracic echocardiography. Image quality was suboptimal. The study was technically difficult, as a result of poor acoustic windows, poor sound wave transmission, restricted patient mobility, and body habitus. Intravenous contrast (Definity) was administered.  Study completion:  There were no complications.          Transthoracic echocardiography.  M-mode, complete 2D, spectral Doppler, and color Doppler.  Birthdate:  Patient birthdate: [birth year].  Age:  Patient is 43 yr old.  Sex:  Gender: male.    BMI: 75.4 kg/m^2.  Blood pressure:     154/79  Patient status:  Inpatient.  Study date: Study date: 03/21/2016. Study time: 09:14 AM.  Location:  Bedside.  -------------------------------------------------------------------  ------------------------------------------------------------------- Prepared and Electronically Authenticated by  Thurmon Fair, MD 2018-01-11T13:38:31             Risk Assessment/Calculations:        STOP-Bang Score:     { Consider Dx Sleep Disordered Breathing or Sleep Apnea  ICD G47.33          :1}    Physical Exam:   VS:  BP (!) 148/82   Pulse 80   Ht 6' 2.5" (1.892 m)   Wt (!) 503 lb 3.2 oz (228.3 kg)   SpO2 95%   BMI 63.74 kg/m    Wt Readings from Last 3 Encounters:  04/11/23 (!) 503 lb 3.2 oz (228.3 kg)  04/03/23 (!) 501 lb 12.8 oz (227.6 kg)  03/24/23 (!)  506 lb 1.6 oz (229.6 kg)    GEN: Well nourished, overweight, well developed in no acute distress NECK: No JVD; No carotid bruits CARDIAC: RRR, no murmurs, rubs, gallops RESPIRATORY:  Clear to auscultation without rales, wheezing or rhonchi  ABDOMEN: Soft, non-tender, non-distended EXTREMITIES:  Bilateral LE with 2+ pitting edema; RLE in wrap. No deformity   ASSESSMENT AND PLAN: .    Chronic congestive heart failure-unknown systolic or diastolic.  Echo 03/2016 not very interpretable due to body habitus.  No ACR/ARB/ARNI due to previous angioedema. Continue Carvedilol 25mg  BID, lasix 80mg  BID, Spironolactone 25mg  QD, Hydralazine 50mg  BID, Lasix 80mg  BID.  Again encouraged take Lasix as prescribed given 45 lb weight gain over last 4 months with 6 lbs over last 2 weeks. Rx Metolazone 2.5mg  30 minutes prior to Lasix to be taken Thursday and Monday. For improvement in volume status. Consider SGLT2i at follow up  Low sodium diet, fluid restriction <2L, and daily weights encouraged. Educated to contact our office for weight gain of 2 lbs overnight or 5 lbs in one week.  Discussed possible cardiac PET vs MRI for assessment of LVEF but would need weight <500 lbs, re-discus at follow up.  High risk for admission, persistent weight gain consistent with fluid  retention. Falling asleep during clinic visit today concerning given untreated OSA, as below.    HTN -BP not at goal <130/80 though improved from prior.  Short course metolazone as above.  Hopeful improvement of volume status will improve BP.  If BP not controlled at follow-up consider further increasing dose of hydralazine.   OSA- CPAP compliance encouraged.  Not using due to poor mask seal.  Provided him in the information for his DME company to stop and pick up new mask.  Anticipate noncompliance with sleep apnea is contributory to worsening heart failure symptoms as well as erythrocytosis. Emphasized need to resume CPAP.   LE edema / Venous  insufficiency - Reports RLE ulcer (covered with leg wrap at time of office visit). LE venous study upcoming later this week and again encouraged to schedule overdue follow up with VVS. Anticipate majority of LE edema exacerbated by nonadherence to diuretic regimen.   Morbid obesity / BMI 73 - Weight loss via diet and exercise encouraged. Discussed the impact being overweight would have on cardiovascular risk. Encouraged to resume Ozempic. Presently not taking due to copay, anticipate due to deductible - encouraged him to discuss with pharmacy/insurance.        Dispo: follow up in  ***  Signed, Alver Sorrow, NP

## 2023-04-12 ENCOUNTER — Encounter (HOSPITAL_BASED_OUTPATIENT_CLINIC_OR_DEPARTMENT_OTHER): Payer: Self-pay | Admitting: Family

## 2023-04-16 ENCOUNTER — Other Ambulatory Visit: Payer: Self-pay | Admitting: Oncology

## 2023-04-16 DIAGNOSIS — D751 Secondary polycythemia: Secondary | ICD-10-CM

## 2023-04-21 ENCOUNTER — Inpatient Hospital Stay (HOSPITAL_BASED_OUTPATIENT_CLINIC_OR_DEPARTMENT_OTHER): Payer: Managed Care, Other (non HMO) | Admitting: Oncology

## 2023-04-21 ENCOUNTER — Encounter: Payer: Self-pay | Admitting: Oncology

## 2023-04-21 ENCOUNTER — Inpatient Hospital Stay: Payer: Managed Care, Other (non HMO) | Attending: Oncology

## 2023-04-21 VITALS — BP 130/78 | HR 76 | Temp 98.5°F | Resp 20 | Ht 74.5 in | Wt >= 6400 oz

## 2023-04-21 DIAGNOSIS — Z86718 Personal history of other venous thrombosis and embolism: Secondary | ICD-10-CM | POA: Insufficient documentation

## 2023-04-21 DIAGNOSIS — D509 Iron deficiency anemia, unspecified: Secondary | ICD-10-CM | POA: Diagnosis not present

## 2023-04-21 DIAGNOSIS — Z862 Personal history of diseases of the blood and blood-forming organs and certain disorders involving the immune mechanism: Secondary | ICD-10-CM | POA: Diagnosis not present

## 2023-04-21 DIAGNOSIS — G4733 Obstructive sleep apnea (adult) (pediatric): Secondary | ICD-10-CM | POA: Diagnosis not present

## 2023-04-21 DIAGNOSIS — D751 Secondary polycythemia: Secondary | ICD-10-CM | POA: Diagnosis present

## 2023-04-21 LAB — COMPREHENSIVE METABOLIC PANEL
ALT: 9 U/L (ref 0–44)
AST: 12 U/L — ABNORMAL LOW (ref 15–41)
Albumin: 4 g/dL (ref 3.5–5.0)
Alkaline Phosphatase: 51 U/L (ref 38–126)
Anion gap: 7 (ref 5–15)
BUN: 17 mg/dL (ref 6–20)
CO2: 29 mmol/L (ref 22–32)
Calcium: 9.4 mg/dL (ref 8.9–10.3)
Chloride: 100 mmol/L (ref 98–111)
Creatinine, Ser: 0.97 mg/dL (ref 0.61–1.24)
GFR, Estimated: >60 mL/min (ref >60)
Glucose, Bld: 106 mg/dL — ABNORMAL HIGH (ref 70–99)
Potassium: 3.8 mmol/L (ref 3.5–5.1)
Sodium: 136 mmol/L (ref 135–145)
Total Bilirubin: 0.4 mg/dL (ref 0.0–1.2)
Total Protein: 8.4 g/dL — ABNORMAL HIGH (ref 6.5–8.1)

## 2023-04-21 LAB — CBC WITH DIFFERENTIAL/PLATELET
Abs Immature Granulocytes: 0.01 10*3/uL (ref 0.00–0.07)
Basophils Absolute: 0 10*3/uL (ref 0.0–0.1)
Basophils Relative: 0 %
Eosinophils Absolute: 0.2 10*3/uL (ref 0.0–0.5)
Eosinophils Relative: 3 %
HCT: 40 % (ref 39.0–52.0)
Hemoglobin: 12.3 g/dL — ABNORMAL LOW (ref 13.0–17.0)
Immature Granulocytes: 0 %
Lymphocytes Relative: 18 %
Lymphs Abs: 1.2 10*3/uL (ref 0.7–4.0)
MCH: 24.5 pg — ABNORMAL LOW (ref 26.0–34.0)
MCHC: 30.8 g/dL (ref 30.0–36.0)
MCV: 79.5 fL — ABNORMAL LOW (ref 80.0–100.0)
Monocytes Absolute: 0.6 10*3/uL (ref 0.1–1.0)
Monocytes Relative: 9 %
Neutro Abs: 4.8 10*3/uL (ref 1.7–7.7)
Neutrophils Relative %: 70 %
Platelets: 222 10*3/uL (ref 150–400)
RBC: 5.03 MIL/uL (ref 4.22–5.81)
RDW: 17 % — ABNORMAL HIGH (ref 11.5–15.5)
WBC: 6.8 10*3/uL (ref 4.0–10.5)
nRBC: 0 % (ref 0.0–0.2)

## 2023-04-21 LAB — IRON AND TIBC
Iron: 32 ug/dL — ABNORMAL LOW (ref 45–182)
Saturation Ratios: 10 % — ABNORMAL LOW (ref 17.9–39.5)
TIBC: 328 ug/dL (ref 250–450)
UIBC: 296 ug/dL

## 2023-04-21 LAB — FERRITIN: Ferritin: 105 ng/mL (ref 24–336)

## 2023-04-21 NOTE — Assessment & Plan Note (Signed)
 On 02/14/2023 he was found to have elevated hemoglobin (18.1) and hematocrit (58.8).   Discussed risks of high red count including clots, strokes, and heart problems.   On his initial consultation with us  on 03/24/2023, labs actually showed improved hemoglobin at 12.4, hematocrit 40, MCV 78.6.  White count and platelet count were also normal.  We did submit request for iron studies, JAK2 mutation analysis prior to CBC results.  JAK2 testing, CALR, MPL mutations, exon 12-15 mutations were all negative.  Iron saturation was low at 11%, ferritin normal at 83.  Clinical picture was consistent with secondary polycythemia, related to sleep apnea and also contributed by obesity hypoventilation syndrome.  Plan was to maintain hematocrit below 55 with therapeutic phlebotomies as needed.  However lately he has been having mild microcytic anemia with evidence of mild iron deficiency.  Chronic intermittent bleeding from leg ulcers could be contributing.  Labs today revealed hemoglobin of 12.3, hematocrit 40, MCV 79.5.  White count and platelet count are within normal limits.  CMP grossly unremarkable.  Iron studies pending.  We will not try to correct iron deficiency in his case, as it can exacerbate polycythemia.  As Corle as hemoglobin is above 11, we will monitor for now.  RTC in 3 to 4 months for follow-up with repeat labs.

## 2023-04-21 NOTE — Assessment & Plan Note (Signed)
 Treated with Coumadin in the early 2000s. - Monitor for signs of thrombosis

## 2023-04-21 NOTE — Progress Notes (Signed)
 [facility]  HEMATOLOGY CLINIC PROGRESS NOTE  PATIENT NAME: [name]   MR#: [id number] DOB: [birth year]  Patient Care Team: Abraham Abo, MD as PCP - General (Family Medicine) Maudine Sos, MD as PCP - Cardiology (Cardiology) Clearnce Curia, NP as Nurse Practitioner (Cardiology)  Date of visit: 04/21/2023   ASSESSMENT & PLAN:   [name] is a 43 y.o. gentleman with past medical history of morbid obesity, OSA on CPAP, hypertension, chronic venous insufficiency, lymphedema, hx of DVT in 2000s, was referred to our clinic in January 2025 for evaluation of polycythemia.  Workup consistent with secondary polycythemia, likely related to OSA and obesity hypoventilation syndrome.  Lately he has been having microcytic anemia with mild iron deficiency.  History of polycythemia On 02/14/2023 he was found to have elevated hemoglobin (18.1) and hematocrit (58.8).   Discussed risks of high red count including clots, strokes, and heart problems.   On his initial consultation with us  on 03/24/2023, labs actually showed improved hemoglobin at 12.4, hematocrit 40, MCV 78.6.  White count and platelet count were also normal.  We did submit request for iron studies, JAK2 mutation analysis prior to CBC results.  JAK2 testing, CALR, MPL mutations, exon 12-15 mutations were all negative.  Iron saturation was low at 11%, ferritin normal at 83.  Clinical picture was consistent with secondary polycythemia, related to sleep apnea and also contributed by obesity hypoventilation syndrome.  Plan was to maintain hematocrit below 55 with therapeutic phlebotomies as needed.  However lately he has been having mild microcytic anemia with evidence of mild iron deficiency.  Chronic intermittent bleeding from leg ulcers could be contributing.  Labs today revealed hemoglobin of 12.3, hematocrit 40, MCV 79.5.  White count and platelet count are within normal limits.  CMP grossly unremarkable.  Iron  studies pending.  We will not try to correct iron deficiency in his case, as it can exacerbate polycythemia.  As Khokhar as hemoglobin is above 11, we will monitor for now.  RTC in 3 to 4 months for follow-up with repeat labs.  History of DVT of lower extremity Treated with Coumadin in the early 2000s. - Monitor for signs of thrombosis    I spent a total of 25 minutes during this encounter with the patient including review of chart and various tests results, discussions about plan of care and coordination of care plan.  I reviewed lab results and outside records for this visit and discussed relevant results with the patient. Diagnosis, plan of care and treatment options were also discussed in detail with the patient. Opportunity provided to ask questions and answers provided to his apparent satisfaction. Provided instructions to call our clinic with any problems, questions or concerns prior to return visit. I recommended to continue follow-up with PCP and sub-specialists. He verbalized understanding and agreed with the plan. No barriers to learning was detected.  [name], MD  04/21/2023 4:35 PM  [facility] - A DEPT OF [facility] [id number] [place] [address] Dept: [phone] Dept Fax: [phone]   CHIEF COMPLAINT/ REASON FOR VISIT:  Initially referred for secondary polycythemia, related to OSA and likely contributed by obesity hypoventilation syndrome.  Currently he has microcytic anemia from mild iron deficiency.  INTERVAL HISTORY:  Discussed the use of AI scribe software for clinical note transcription with the patient, who gave verbal consent to proceed.   Patient returns for follow-up.  He was  initially referred due to a high red blood cell count, but recent labs show a decrease in hemoglobin levels, indicating mild anemia. The patient reports no significant blood loss, except for minor  bleeding from bilateral leg ulcers. He denies taking any iron supplements in the past. The patient also uses a CPAP machine for his OSA. There have been no reports of chest pain or unintentional weight loss.  SUMMARY OF HEMATOLOGIC HISTORY:  He was found to have abnormal CBC from 02/14/2023.  His hemoglobin was 18.1, hematocrit 58.8, MCV 81.  White count was 10,500.  No differential was obtained.  Platelet count was normal at 378,000.  He was referred to us  for further evaluation of polycythemia.   This was the first time the patient was informed of having a high red blood cell count. Previous lab results showed the patient's red blood cell count as sometimes low, but mostly normal. The recent lab results showed a hemoglobin level of 18.1 (normal up to 17) and a hematocrit level of 58.8. The patient has not been on any testosterone injections and does not smoke. The patient also has a history of ulcers in the legs, which has led to a fluid restriction.   He denies intermittent headaches, shortness of breath on exertion, dizziness, frequent leg cramps or chest pain.    He has a hx of blood clot in the leg in early 2000s. He was on coumadin at that time and took it for up to an year.    He has prior diagnosis of obstructive sleep apnea and claims to be compliant with CPAP.  On 02/14/2023 he was found to have elevated hemoglobin (18.1) and hematocrit (58.8).   Discussed risks of high red count including clots, strokes, and heart problems.   On his initial consultation with us  on 03/24/2023, labs actually showed improved hemoglobin at 12.4, hematocrit 40, MCV 78.6.  White count and platelet count were also normal.  We did submit request for iron studies, JAK2 mutation analysis prior to CBC results.  JAK2 testing, CALR, MPL mutations, exon 12-15 mutations were all negative.  Iron saturation was low at 11%, ferritin normal at 83.  Clinical picture was consistent with secondary polycythemia, related to  sleep apnea and also contributed by obesity hypoventilation syndrome.  Plan was to maintain hematocrit below 55 with therapeutic phlebotomies as needed.  However lately he has been having mild microcytic anemia with evidence of mild iron deficiency.  Chronic intermittent bleeding from leg ulcers could be contributing.  He also has history of DVT of lower extremity, treated with Coumadin in the early 2000s.    I have reviewed the past medical history, past surgical history, social history and family history with the patient and they are unchanged from previous note.  ALLERGIES: He is allergic to entresto  [sacubitril -valsartan ], lidocaine , other, peanut-containing drug products, hydrofera blue 4"x4" [wound dressings], and latex.  MEDICATIONS:  Current Outpatient Medications  Medication Sig Dispense Refill   Accu-Chek Softclix Lancets lancets Use as instructed 100 each 12   Blood Glucose Monitoring Suppl (ACCU-CHEK GUIDE ME) w/Device KIT USE UP TO 4 TIMES A DAY AS DIRECTED 1 kit 0   carvedilol  (COREG ) 25 MG tablet TAKE 1 TABLET BY MOUTH TWICE A DAY 180 tablet 0   dapagliflozin  propanediol (FARXIGA ) 10 MG TABS tablet Take 1 tablet (10 mg total) by mouth daily before breakfast. 30 tablet 11   dapagliflozin  propanediol (FARXIGA ) 10 MG TABS tablet Take 1 tablet (10 mg total) by mouth daily before  breakfast. 7 tablet 0   EPINEPHrine  0.3 mg/0.3 mL IJ SOAJ injection Inject 0.3 mg into the muscle as needed for anaphylaxis. 1 each 0   furosemide  (LASIX ) 80 MG tablet Take 1 tablet (80 mg total) by mouth 2 (two) times daily. 180 tablet 3   gabapentin  (NEURONTIN ) 300 MG capsule TAKE 1 CAPSULE BY MOUTH THREE TIMES A DAY 90 capsule 1   glucose blood (ACCU-CHEK GUIDE) test strip Use as instructed 100 each 12   hydrALAZINE  (APRESOLINE ) 50 MG tablet TAKE 1 TABLET (50 MG) BY MOUTH IN THE MORNING AND AT BEDTIME 180 tablet 3   metFORMIN  (GLUCOPHAGE ) 500 MG tablet TAKE 1 TABLET BY MOUTH EVERY DAY WITH BREAKFAST 90  tablet 0   Semaglutide , 2 MG/DOSE, 8 MG/3ML SOPN Inject 2 mg as directed once a week. 3 mL 1   spironolactone  (ALDACTONE ) 25 MG tablet Take 1 tablet (25 mg total) by mouth daily. 90 tablet 3   traMADol  (ULTRAM ) 50 MG tablet Take 1 tablet (50 mg total) by mouth every 6 (six) hours as needed for moderate pain (pain score 4-6). 30 tablet 0   No current facility-administered medications for this visit.     REVIEW OF SYSTEMS:    ROS  All other pertinent systems were reviewed with the patient and are negative.  PHYSICAL EXAMINATION:   Onc Performance Status - 04/21/23 1505       ECOG Perf Status   ECOG Perf Status Fully active, able to carry on all pre-disease performance without restriction      KPS SCALE   KPS % SCORE Normal, no compliants, no evidence of disease             Vitals:   04/21/23 1501  BP: 130/78  Pulse: 76  Resp: 20  Temp: 98.5 F (36.9 C)  SpO2: 100%   Filed Weights   04/21/23 1501  Weight: (!) 508 lb 12.8 oz (230.8 kg)    Physical Exam Constitutional:      General: He is not in acute distress.    Appearance: He is obese.  HENT:     Head: Normocephalic and atraumatic.  Eyes:     General: No scleral icterus.    Conjunctiva/sclera: Conjunctivae normal.  Cardiovascular:     Rate and Rhythm: Normal rate and regular rhythm.  Pulmonary:     Effort: Pulmonary effort is normal.  Neurological:     General: No focal deficit present.     Mental Status: He is alert and oriented to person, place, and time.  Psychiatric:        Mood and Affect: Mood normal.        Behavior: Behavior normal.     LABORATORY DATA:   I have reviewed the data as listed.  Results for orders placed or performed in visit on 04/21/23  Ferritin  Result Value Ref Range   Ferritin 105 24 - 336 ng/mL  Comprehensive metabolic panel  Result Value Ref Range   Sodium 136 135 - 145 mmol/L   Potassium 3.8 3.5 - 5.1 mmol/L   Chloride 100 98 - 111 mmol/L   CO2 29 22 - 32  mmol/L   Glucose, Bld 106 (H) 70 - 99 mg/dL   BUN 17 6 - 20 mg/dL   Creatinine, Ser 8.29 0.61 - 1.24 mg/dL   Calcium 9.4 8.9 - 56.2 mg/dL   Total Protein 8.4 (H) 6.5 - 8.1 g/dL   Albumin 4.0 3.5 - 5.0 g/dL   AST 12 (L)  15 - 41 U/L   ALT 9 0 - 44 U/L   Alkaline Phosphatase 51 38 - 126 U/L   Total Bilirubin 0.4 0.0 - 1.2 mg/dL   GFR, Estimated >16 >10 mL/min   Anion gap 7 5 - 15  CBC with Differential/Platelet  Result Value Ref Range   WBC 6.8 4.0 - 10.5 K/uL   RBC 5.03 4.22 - 5.81 MIL/uL   Hemoglobin 12.3 (L) 13.0 - 17.0 g/dL   HCT 96.0 45.4 - 09.8 %   MCV 79.5 (L) 80.0 - 100.0 fL   MCH 24.5 (L) 26.0 - 34.0 pg   MCHC 30.8 30.0 - 36.0 g/dL   RDW 11.9 (H) 14.7 - 82.9 %   Platelets 222 150 - 400 K/uL   nRBC 0.0 0.0 - 0.2 %   Neutrophils Relative % 70 %   Neutro Abs 4.8 1.7 - 7.7 K/uL   Lymphocytes Relative 18 %   Lymphs Abs 1.2 0.7 - 4.0 K/uL   Monocytes Relative 9 %   Monocytes Absolute 0.6 0.1 - 1.0 K/uL   Eosinophils Relative 3 %   Eosinophils Absolute 0.2 0.0 - 0.5 K/uL   Basophils Relative 0 %   Basophils Absolute 0.0 0.0 - 0.1 K/uL   Immature Granulocytes 0 %   Abs Immature Granulocytes 0.01 0.00 - 0.07 K/uL   RADIOGRAPHIC STUDIES:  I have personally reviewed the radiological images as listed and agree with the findings in the report.  VAS US  LOWER EXTREMITY VENOUS REFLUX Result Date: 04/05/2023  Lower Venous Reflux Study Patient Name:  [name]  Date of Exam:   04/04/2023 Medical Rec #: [id number]      Accession #:    [id number] Date of Birth: 04-26-1980      Patient Gender: M Patient Age:   64 years Exam Location:  [facility] Procedure:      VAS US  LOWER EXTREMITY VENOUS REFLUX Referring Phys: CAITLIN WALKER --------------------------------------------------------------------------------  Indications: Bilateral leg swelling, right > left, with a non-healing, wrapped wound for about 8 months. Patient denies chest pain and SOB. Other Indications: Patient indicates in  2000 in [place]  the right leg veins                    were ligated /stripped. He is unsure of how much of the GSV                    was worked on. Comparison Study: 11/02/2020 right leg venous reflux exam was technically                   difficult and a limited exam. From what was visualized there                   was no evidence of acute DVT seen from CFV through the                   popiteal veins. No reflux seen in the right GSV from mid thigh                   to distal calf, post ablation/stripping in the thigh area.                   There was reflux seen in the right CFV. Enlarged groin lymph                   node. Performing Technologist: Richarda Chance RVT,  RDCS (AE), RDMS  Examination Guidelines: A complete evaluation includes B-mode imaging, spectral Doppler, color Doppler, and power Doppler as needed of all accessible portions of each vessel. Bilateral testing is considered an integral part of a complete examination. Limited examinations for reoccurring indications may be performed as noted. The reflux portion of the exam is performed with the patient in reverse Trendelenburg. Significant venous reflux is defined as >500 ms in the superficial venous system, and >1 second in the deep venous system.  Venous Reflux Times +--------------+---------+------+-----------+------------+---------------------+ RIGHT         Reflux NoRefluxReflux TimeDiameter cmsComments                                      Yes                                               +--------------+---------+------+-----------+------------+---------------------+ GSV prox thighno                            .87                           +--------------+---------+------+-----------+------------+---------------------+ GSV mid thigh no                            .71     varicosities with no                                                      reflux seen. Unable                                                        to squeeze thigh.     +--------------+---------+------+-----------+------------+---------------------+ GSV at knee   no                            .85                           +--------------+---------+------+-----------+------------+---------------------+ GSV prox calf no                            .67                           +--------------+---------+------+-----------+------------+---------------------+ GSV mid calf  no                            .58                           +--------------+---------+------+-----------+------------+---------------------+   Summary: Right: - No evidence of superficial venous reflux seen in the right greater saphenous  vein. Limited exam.  - There is significant amount of tissue edema. Study limited due to patient characteristics. Large focal hyperechoic area measuring approximately 9.9 x 4.1 x 6.8 cm right upper thigh/groin. Consider dedicated US  by radiology to better define structure if  clinically indicated. May represent focal adipose tissue (normal) . -Due to poor tissue interface and depth of vessels unable to determine whether DVT was present, although the CFV was seen and patent. Technically challenging and mostly a non-diagnostic exam.  *See table(s) above for measurements and observations. Electronically signed by Federico Hopkins on 04/05/2023 at 5:41:26 PM.    Final     Orders Placed This Encounter  Procedures   CBC with Differential (Cancer Center Only)    Standing Status:   Future    Expected Date:   08/19/2023    Expiration Date:   04/20/2024   CMP (Cancer Center only)    Standing Status:   Future    Expected Date:   08/19/2023    Expiration Date:   04/20/2024   Iron and TIBC    Standing Status:   Future    Expected Date:   08/19/2023    Expiration Date:   04/20/2024   Ferritin    Standing Status:   Future    Expected Date:   08/19/2023    Expiration Date:   04/20/2024     Future Appointments  Date Time Provider  Department Center  05/16/2023  7:30 AM [facility] 1 [facility]  05/16/2023  8:40 AM Philipp Brawn, MD [facility]  07/03/2023  4:00 PM Maudine Sos, MD [facility]  08/19/2023  2:30 PM [facility] PHLEBOTOMIST [facility] None  08/19/2023  3:00 PM Apple Dearmas, Gale Jude, MD [facility] None     This document was completed utilizing speech recognition software. Grammatical errors, random word insertions, pronoun errors, and incomplete sentences are an occasional consequence of this system due to software limitations, ambient noise, and hardware issues. Any formal questions or concerns about the content, text or information contained within the body of this dictation should be directly addressed to the provider for clarification.

## 2023-05-01 NOTE — Telephone Encounter (Signed)
Attn: Audrey   Pt [name] Calling to get updated information regarding the status of his lymphoedema pump. Patient spoke with Lea at [facility] today and she said no informations has been received.  Can you please call Lea at [phone]

## 2023-05-01 NOTE — Telephone Encounter (Signed)
 I have attempted without success to contact this patient by phone to return their call and I left a message on answering machine.

## 2023-05-02 NOTE — Telephone Encounter (Signed)
Call place to Orlando Penner with [employer] regarding paperwork need for patient lymphedema  supplies.

## 2023-05-07 ENCOUNTER — Other Ambulatory Visit: Payer: Self-pay

## 2023-05-07 DIAGNOSIS — I70219 Atherosclerosis of native arteries of extremities with intermittent claudication, unspecified extremity: Secondary | ICD-10-CM

## 2023-05-08 ENCOUNTER — Telehealth: Payer: Self-pay | Admitting: *Deleted

## 2023-05-08 NOTE — Telephone Encounter (Signed)
 Call place to Orlando Penner with [employer]. This was a f/up call regarding compression device for patient legs.

## 2023-05-08 NOTE — Telephone Encounter (Signed)
 Please advise when done so it can be sent to  [facility]  Hello Magda Paganini! Thank you for your call and I apologize we have played a little phone tag!  Mr. Ferrelli had a f/u with Dr. Andrey Campanile on 1/23, correct?   Here are the remaining criteria for [employer] documentation for Flexitouch [phone]): - Lymphedema diagnosis - Four weeks conservative treatment including failure of all components: compression, elevation   AND exercise - Unique characteristics (pain, scarring, ulcers/wounds, contracture, sensitive skin)  Thank you again! Please feel free to reach out to me with any questions regarding the above! Once his visit note is complete, please fax directly to me at the fax number below!  I would also be glad to stop by to assist in completing the above information, if that would be helpful!  Have a wonderful day!!!

## 2023-05-15 NOTE — Progress Notes (Signed)
 Patient ID: [name], male   DOB: 03/18/1980, 43 y.o.   MRN: [id number]  Reason for Consult: New Patient (Initial Visit)   Referred by Georganna Skeans, MD  Subjective:     HPI  [name] is a 43 y.o. male who presents for evaluation of lower extremity swelling and wounds Timeframe: 20+ years Symptoms: Significant swelling and wounds Varicosities: No Previous wounds: yes, wounds throughout bilateral lower extremities Previous DVT: Multiple previous right DVTs In compression: Yes, Unna boots and working on getting new lymphedema pumps  Past Medical History:  Diagnosis Date  . Chronic venous insufficiency    a. as a premature infant, required venous access for care with subsequent DVT and some sort of procedure -> eventually went on to have vein bypass around 2005.  [name] Hypertension   . Lymphedema   . Morbid obesity (HCC)   . OSA on CPAP    Family History  Problem Relation Age of Onset  . Diabetes Mother   . Cancer Father        colon  . Cancer Maternal Grandmother   . Cancer Maternal Grandfather    Past Surgical History:  Procedure Laterality Date  . balloon stenting Right    right leg  . VEIN LIGATION AND STRIPPING      Short Social History:  Social History   Tobacco Use  . Smoking status: Never  . Smokeless tobacco: Never  Substance Use Topics  . Alcohol use: Yes    Alcohol/week: 4.0 standard drinks of alcohol    Types: 4 Standard drinks or equivalent per week    Comment: liquor - pt states once a week    Allergies  Allergen Reactions  . Entresto [Sacubitril-Valsartan] Swelling and Other (See Comments)    Tongue swelling  . Lidocaine Hives, Shortness Of Breath and Other (See Comments)    "burns my skin" also- Patient ended up in the ED  . Other Anaphylaxis, Swelling and Other (See Comments)    Tree nuts cause swelling  . Peanut-Containing Drug Products Anaphylaxis and Swelling  . Hydrofera Blue 4"X4" [Wound Dressings] Nausea Only and Other (See  Comments)    Made the patient light-headed and caused sweating also  . Latex Hives and Other (See Comments)    Reaction to latex gloves    Current Outpatient Medications  Medication Sig Dispense Refill  . Accu-Chek Softclix Lancets lancets Use as instructed 100 each 12  . Blood Glucose Monitoring Suppl (ACCU-CHEK GUIDE ME) w/Device KIT USE UP TO 4 TIMES A DAY AS DIRECTED 1 kit 0  . carvedilol (COREG) 25 MG tablet TAKE 1 TABLET BY MOUTH TWICE A DAY 180 tablet 0  . dapagliflozin propanediol (FARXIGA) 10 MG TABS tablet Take 1 tablet (10 mg total) by mouth daily before breakfast. 30 tablet 11  . dapagliflozin propanediol (FARXIGA) 10 MG TABS tablet Take 1 tablet (10 mg total) by mouth daily before breakfast. 7 tablet 0  . EPINEPHrine 0.3 mg/0.3 mL IJ SOAJ injection Inject 0.3 mg into the muscle as needed for anaphylaxis. 1 each 0  . furosemide (LASIX) 80 MG tablet Take 1 tablet (80 mg total) by mouth 2 (two) times daily. 180 tablet 3  . gabapentin (NEURONTIN) 300 MG capsule TAKE 1 CAPSULE BY MOUTH THREE TIMES A DAY 90 capsule 1  . glucose blood (ACCU-CHEK GUIDE) test strip Use as instructed 100 each 12  . hydrALAZINE (APRESOLINE) 50 MG tablet TAKE 1 TABLET (50 MG) BY MOUTH IN THE MORNING AND  AT BEDTIME 180 tablet 3  . metFORMIN (GLUCOPHAGE) 500 MG tablet TAKE 1 TABLET BY MOUTH EVERY DAY WITH BREAKFAST 90 tablet 0  . Semaglutide, 2 MG/DOSE, 8 MG/3ML SOPN Inject 2 mg as directed once a week. 3 mL 1  . spironolactone (ALDACTONE) 25 MG tablet Take 1 tablet (25 mg total) by mouth daily. 90 tablet 3  . traMADol (ULTRAM) 50 MG tablet Take 1 tablet (50 mg total) by mouth every 6 (six) hours as needed for moderate pain (pain score 4-6). 30 tablet 0   No current facility-administered medications for this visit.    REVIEW OF SYSTEMS   All other systems were reviewed and are negative     Objective:  Objective   Vitals:   05/16/23 0819  BP: (!) 164/107  Pulse: 76  Resp: (!) 24  Temp: 98 F  (36.7 C)  SpO2: 90%  Weight: (!) 496 lb (225 kg)  Height: 6' 2.5" (1.892 m)   Body mass index is 62.83 kg/m.  Physical Exam General: no acute distress Cardiac: hemodynamically stable Pulm: normal work of breathing Neuro: alert, no focal deficit Extremities: Significant swelling from foot through knee with Unna boots in place Vascular:   Right: palpable DP  Left: palpable DP   Data: ABI +---------+------------------+-----+---------+--------+  Right   Rt Pressure (mmHg)IndexWaveform Comment   +---------+------------------+-----+---------+--------+  Brachial 179                                       +---------+------------------+-----+---------+--------+  ATA     195               1.09 triphasic          +---------+------------------+-----+---------+--------+  PTA     211               1.18 triphasic          +---------+------------------+-----+---------+--------+  Great Toe145               0.81                    +---------+------------------+-----+---------+--------+   +---------+------------------+-----+---------+-------+  Left    Lt Pressure (mmHg)IndexWaveform Comment  +---------+------------------+-----+---------+-------+  Brachial 163                                      +---------+------------------+-----+---------+-------+  ATA     214               1.20 triphasic         +---------+------------------+-----+---------+-------+  PTA     223               1.25 triphasic         +---------+------------------+-----+---------+-------+  Great Toe179               1.00                   +---------+------------------+-----+---------+-------+       Assessment/Plan:     [name] is a 43 y.o. male with chronic venous insufficiency with C6 disease and has undergone multiple previous bilateral GSV procedures. He was last seen by Dr. Durwin Nora in August 2022 with plan to follow-up in a few months for formal  arterial testing to determine if there was any arterial component could aid  in his healing.  He was then lost to follow-up.  ABI today demonstrates normal perfusion to bilateral lower extremities I explained the foundation of CVI treatment of compression and elevation, he is also in the process of getting new lymphedema pumps. I recommended medical grade graduated compression stockings and intermittent leg elevation I also stressed the importance of exercise and weight loss which he is continuing to do 4-5 times per week  Instructed to continue wound care, Unna boots and his compression pumps once they are delivered.      Daria Pastures MD [facility]

## 2023-05-16 ENCOUNTER — Ambulatory Visit (HOSPITAL_COMMUNITY)
Admission: RE | Admit: 2023-05-16 | Discharge: 2023-05-16 | Disposition: A | Discharge status: Home / Self Care | Payer: Managed Care, Other (non HMO) | Source: Ambulatory Visit | Attending: Vascular Surgery | Admitting: Vascular Surgery

## 2023-05-16 ENCOUNTER — Encounter: Payer: Self-pay | Admitting: Vascular Surgery

## 2023-05-16 ENCOUNTER — Ambulatory Visit: Payer: Managed Care, Other (non HMO) | Admitting: Vascular Surgery

## 2023-05-16 VITALS — BP 164/107 | HR 76 | Temp 98.0°F | Resp 24 | Ht 74.5 in | Wt >= 6400 oz

## 2023-05-16 DIAGNOSIS — I872 Venous insufficiency (chronic) (peripheral): Secondary | ICD-10-CM | POA: Diagnosis not present

## 2023-05-16 DIAGNOSIS — I890 Lymphedema, not elsewhere classified: Secondary | ICD-10-CM | POA: Diagnosis not present

## 2023-05-16 DIAGNOSIS — I70219 Atherosclerosis of native arteries of extremities with intermittent claudication, unspecified extremity: Secondary | ICD-10-CM | POA: Insufficient documentation

## 2023-05-16 LAB — VAS US ABI WITH/WO TBI
Left ABI: 1.25
Right ABI: 1.18

## 2023-05-21 ENCOUNTER — Telehealth: Payer: Self-pay | Admitting: *Deleted

## 2023-05-21 NOTE — Telephone Encounter (Signed)
 Provider is aware that the patient is waiting on her to do addendum for his leg compression(lymphedema).   I have made provider aware of what need to be put in chart for completion,given to me via email from [place], with [employer].

## 2023-05-26 ENCOUNTER — Other Ambulatory Visit (HOSPITAL_BASED_OUTPATIENT_CLINIC_OR_DEPARTMENT_OTHER): Payer: Self-pay | Admitting: Family

## 2023-05-26 DIAGNOSIS — I5042 Chronic combined systolic (congestive) and diastolic (congestive) heart failure: Secondary | ICD-10-CM

## 2023-06-18 ENCOUNTER — Ambulatory Visit: Payer: Self-pay

## 2023-06-18 ENCOUNTER — Encounter: Payer: Self-pay | Admitting: Physician Assistant

## 2023-06-18 ENCOUNTER — Ambulatory Visit: Attending: Physician Assistant | Admitting: Physician Assistant

## 2023-06-18 VITALS — BP 144/87 | HR 79 | Ht 74.0 in | Wt >= 6400 oz

## 2023-06-18 DIAGNOSIS — E119 Type 2 diabetes mellitus without complications: Secondary | ICD-10-CM

## 2023-06-18 DIAGNOSIS — L97909 Non-pressure chronic ulcer of unspecified part of unspecified lower leg with unspecified severity: Secondary | ICD-10-CM

## 2023-06-18 DIAGNOSIS — Z7985 Long-term (current) use of injectable non-insulin antidiabetic drugs: Secondary | ICD-10-CM | POA: Diagnosis not present

## 2023-06-18 DIAGNOSIS — E1165 Type 2 diabetes mellitus with hyperglycemia: Secondary | ICD-10-CM | POA: Diagnosis not present

## 2023-06-18 DIAGNOSIS — I10 Essential (primary) hypertension: Secondary | ICD-10-CM

## 2023-06-18 DIAGNOSIS — I83009 Varicose veins of unspecified lower extremity with ulcer of unspecified site: Secondary | ICD-10-CM

## 2023-06-18 DIAGNOSIS — Z3141 Encounter for fertility testing: Secondary | ICD-10-CM

## 2023-06-18 LAB — POCT GLYCOSYLATED HEMOGLOBIN (HGB A1C): HbA1c, POC (controlled diabetic range): 6.5 % (ref 0.0–7.0)

## 2023-06-18 LAB — GLUCOSE, POCT (MANUAL RESULT ENTRY): POC Glucose: 110 mg/dL — AB (ref 70–99)

## 2023-06-18 MED ORDER — SEMAGLUTIDE (2 MG/DOSE) 8 MG/3ML ~~LOC~~ SOPN: 2.0000 mg | PEN_INJECTOR | SUBCUTANEOUS | 1 refills | Status: DC

## 2023-06-18 MED ORDER — TRAMADOL HCL 50 MG PO TABS: 50.0000 mg | ORAL_TABLET | Freq: Four times a day (QID) | ORAL | 0 refills | Status: DC | PRN

## 2023-06-18 MED ORDER — GABAPENTIN 300 MG PO CAPS: 300.0000 mg | ORAL_CAPSULE | Freq: Three times a day (TID) | ORAL | 3 refills | Status: DC

## 2023-06-18 MED ORDER — SEMAGLUTIDE (1 MG/DOSE) 4 MG/3ML ~~LOC~~ SOPN: 1.0000 mg | PEN_INJECTOR | SUBCUTANEOUS | 0 refills | Status: AC

## 2023-06-18 MED ORDER — SEMAGLUTIDE (1 MG/DOSE) 4 MG/3ML ~~LOC~~ SOPN: 1.0000 mg | PEN_INJECTOR | SUBCUTANEOUS | 0 refills | Status: DC

## 2023-06-18 MED ORDER — METFORMIN HCL 500 MG PO TABS: 500.0000 mg | ORAL_TABLET | Freq: Every day | ORAL | 1 refills | Status: DC

## 2023-06-18 NOTE — Telephone Encounter (Signed)
 Chief Complaint: Exacerbation of diabetic ulcer on calf Symptoms: redness, bleeding, pain Frequency: worsened recently Pertinent Negatives: Patient denies fever Disposition: [] ED /[] Urgent Care (no appt availability in office) / [x] Appointment(In office/virtual)/ []  [facility]/ [] Home Care/ [] Refused Recommended Disposition /[] [facility]/ []  Follow-up with PCP Additional Notes: Patient called in stating he went to Urgent Care on Sunday to be seen for worsening exacerbation of diabetic ulcer on right calf. Patient states he was told to contact primary care offices for further evaluation and follow up. Patient is having continued bleeding and redness since appointment. Patient was placed on Bactrim. Patient also has concerns for sexual health that he would not like to discuss on phone but wants a referral to Urologist. This RN advised patient mention this at his appointment and follow up with PCP.    Copied from CRM [id number]. Topic: Clinical - Red Word Triage >> Jun 18, 2023  8:33 AM Clayton Bibles wrote: Red Word that prompted transfer to Nurse Triage:  His left leg has a wound on his calf. He went to urgent care on Sunday and they told him to get an appointment with his primary doctor. It is still bleeding, sore, he has it wrapped up. It is about a 3 X 3 size. He is having a lot of pain. 8 out of 10. Reason for Disposition  [1] NWPT AND [2] new minor bleeding (e.g., flecks of blood, pink-tinged drainage) AND [3] taking Coumadin (warfarin) or other strong blood thinner, or known bleeding disorder (e.g., thrombocytopenia)    Went to UC and referred to Primary Care to get further evaluation of exacerbation of diabetic ulcer  Answer Assessment - Initial Assessment Questions 1. SYMPTOM or QUESTION: "What is your reason for calling today?" or "How can I best help you?" (e.g., bleeding, redness, drainage, pain)     Make an appt follow up with PCP post UC 2. WOUND APPEARANCE: "What does  the wound look like?" "Is there new or spreading redness?" If Yes, ask: "What is the size of the red area?" (Inches, centimeters, or compare to size of a coin).  "Has the drainage changed or increased?"  "Is there a new odor?"       Covered with gauze - actual wound is 3/3 3. ONSET: "When did the problem begin?"     It is a chronic diabetic ulcer 4. LOCATION: "Where is the wound(s)?" (e.g., , ankle, lower left leg)     Right calf 5. BLEEDING: "Are you having a problem with bleeding?"  (e.g., amount, timing)     Mild bleeding 6. PAIN: "Is there any pain?" If Yes, ask: "How bad is the pain?" (Scale 1-10; or mild, moderate, severe)     8/10 7. FEVER: "Do you have a fever?" If Yes, ask: "What is your temperature, how was it measured, and when did it start?"     No 8. OTHER SYMPTOMS: "Do you have any other symptoms?" (e.g., chills, rash elsewhere, new weakness)     No 9. TREATMENT: "How have you been treating the wound?"     Went to Urgent Care - covered with gauze and wrapped up 10. NEGATIVE PRESSURE WOUND THERAPY  (NPWT): "What concerns do you have about your negative pressure dressing?"  "When was your last dressing change?"  "Are you getting a device alert or alarm?"  "What have you done to try to fix the problem?"          No wound vac  Protocols used: Wound -  Chronic and Negative Pressure Wound Therapy-A-AH

## 2023-06-18 NOTE — Progress Notes (Signed)
 Patient ID: AGRON SWINEY, male   DOB: February 11, 1981, 43 y.o.   MRN: [id number]    [name], is a 43 y.o. male  BMW:[id number]  [id number]:[id number]  DOB - 12-02-1980  Chief Complaint  Patient presents with   Ulcer on leg    Ulcer on R leg       Subjective:   [name] is a 43 y.o. male here today for a follow up after being seen at [facility] and started on antibiotics.  He has had the ulcer for over a year.  He is being followed by vascular and was started on septra 2 days ago.  No fevers.  His wife is a Engineer, civil (consulting).  He says he has struggled with these for years.  He is due A1C and has been out of semaglutide about  2 months.  He is requesting something for pain.    Also, he and his wife want to have children and have not been having success.  He wants to go to a urologist to see if there are any issues on his end.    No problems updated.  ALLERGIES: Allergies  Allergen Reactions   Entresto [Sacubitril-Valsartan] Swelling and Other (See Comments)    Tongue swelling   Lidocaine Hives, Shortness Of Breath and Other (See Comments)    "burns my skin" also- Patient ended up in the ED   Other Anaphylaxis, Swelling and Other (See Comments)    Tree nuts cause swelling   Peanut-Containing Drug Products Anaphylaxis and Swelling   Hydrofera Blue 4"X4" [Wound Dressings] Nausea Only and Other (See Comments)    Made the patient light-headed and caused sweating also   Latex Hives and Other (See Comments)    Reaction to latex gloves    PAST MEDICAL HISTORY: Past Medical History:  Diagnosis Date   Chronic venous insufficiency    a. as a premature infant, required venous access for care with subsequent DVT and some sort of procedure -> eventually went on to have vein bypass around 2005.   Hypertension    Lymphedema    Morbid obesity (HCC)    OSA on CPAP     MEDICATIONS AT HOME: Prior to Admission medications   Medication Sig Start Date End Date Taking? Authorizing Provider  Accu-Chek Softclix  Lancets lancets Use as instructed 04/15/22  Yes Georganna Skeans, MD  Blood Glucose Monitoring Suppl (ACCU-CHEK GUIDE ME) w/Device KIT USE UP TO 4 TIMES A DAY AS DIRECTED 03/31/22  Yes Georganna Skeans, MD  carvedilol (COREG) 25 MG tablet TAKE 1 TABLET BY MOUTH TWICE A DAY 04/18/23  Yes Georganna Skeans, MD  dapagliflozin propanediol (FARXIGA) 10 MG TABS tablet Take 1 tablet (10 mg total) by mouth daily before breakfast. 04/11/23  Yes Alver Sorrow, NP  dapagliflozin propanediol (FARXIGA) 10 MG TABS tablet Take 1 tablet (10 mg total) by mouth daily before breakfast. 04/11/23  Yes Alver Sorrow, NP  EPINEPHrine 0.3 mg/0.3 mL IJ SOAJ injection Inject 0.3 mg into the muscle as needed for anaphylaxis. 09/21/21  Yes Margarita Grizzle, MD  furosemide (LASIX) 80 MG tablet Take 1 tablet (80 mg total) by mouth 2 (two) times daily. 04/11/23 04/05/24 Yes Alver Sorrow, NP  glucose blood (ACCU-CHEK GUIDE) test strip Use as instructed 04/15/22  Yes Georganna Skeans, MD  hydrALAZINE (APRESOLINE) 50 MG tablet TAKE 1 TABLET (50 MG) BY MOUTH IN THE MORNING AND AT BEDTIME 05/27/23  Yes Alver Sorrow, NP  Semaglutide, 1 MG/DOSE, 4 MG/3ML SOPN Inject 1  mg into the skin once a week. 06/18/23 07/18/23 Yes Charl Wellen, Marzella Schlein, PA-C  spironolactone (ALDACTONE) 25 MG tablet Take 1 tablet (25 mg total) by mouth daily. 04/11/23  Yes Alver Sorrow, NP  gabapentin (NEURONTIN) 300 MG capsule Take 1 capsule (300 mg total) by mouth 3 (three) times daily. 06/18/23   Anders Simmonds, PA-C  metFORMIN (GLUCOPHAGE) 500 MG tablet Take 1 tablet (500 mg total) by mouth daily with breakfast. 06/18/23   Anders Simmonds, PA-C  Semaglutide, 2 MG/DOSE, 8 MG/3ML SOPN Inject 2 mg as directed once a week. 07/18/23   Anders Simmonds, PA-C  traMADol (ULTRAM) 50 MG tablet Take 1 tablet (50 mg total) by mouth every 6 (six) hours as needed for moderate pain (pain score 4-6). 06/18/23   Hakeem Frazzini, Marzella Schlein, PA-C    ROS: Neg HEENT Neg resp Neg cardiac Neg GI Neg  GU Neg MS Neg psych Neg neuro  Objective:   Vitals:   06/18/23 1507  BP: (!) 144/87  Pulse: 79  SpO2: 97%  Weight: (!) 497 lb (225.4 kg)  Height: 6\' 2"  (1.88 m)   Exam General appearance : Awake, alert, not in any distress. Speech Clear. Not toxic looking;  morbidly obese HEENT: Atraumatic and Normocephalic, pupils equally reactive to light and accomodation Neck: Supple, no JVD. No cervical lymphadenopathy.  Chest: Good air entry bilaterally, CTAB.  No rales/rhonchi/wheezing CVS: S1 S2 regular, no murmurs.  Extremities: RLE unwrapped with ulceration about as described 2 days ago.  Xeroform gauze is in place.  I redressed.  Moderate to Severe lymphedema B.  DP pulse =intact and no discoloration distally Neurology: Awake alert, and oriented X 3, CN II-XII intact, Non focal Skin: No Rash  Data Review Lab Results  Component Value Date   HGBA1C 6.5 06/18/2023   HGBA1C 6.7 (H) 02/14/2023   HGBA1C 6.3 (A) 05/09/2022    Assessment & Plan   1. Type 2 diabetes mellitus with hyperglycemia, unspecified whether [place] term insulin use (HCC) (Primary) Improved.  Resume semaglutide and 1mg   weekly for 1 month then 2mg  weekly thereafter - Glucose (CBG) - HgB A1c  2. [place]-term current use of injectable noninsulin antidiabetic medication  3. Venous stasis ulcer, unspecified site, unspecified ulcer stage, unspecified whether varicose veins present (HCC) - Ambulatory referral to Wound Clinic - traMADol (ULTRAM) 50 MG tablet; Take 1 tablet (50 mg total) by mouth every 6 (six) hours as needed for moderate pain (pain score 4-6).  Dispense: 30 tablet; Refill: 0 - gabapentin (NEURONTIN) 300 MG capsule; Take 1 capsule (300 mg total) by mouth 3 (three) times daily.  Dispense: 90 capsule; Refill: 3  4. Type 2 diabetes mellitus without complication, without [place]-term current use of insulin (HCC) - metFORMIN (GLUCOPHAGE) 500 MG tablet; Take 1 tablet (500 mg total) by mouth daily with breakfast.   Dispense: 90 tablet; Refill: 1  5. Essential hypertension Continue current regimen   Return in about 3 months (around 09/17/2023) for PCP for chronic conditions.  The patient was given clear instructions to go to ER or return to medical center if symptoms don't improve, worsen or new problems develop. The patient verbalized understanding. The patient was told to call to get lab results if they haven't heard anything in the next week.      Georgian Co, PA-C [facility] [place], [place] [phone]   06/18/2023, 3:45 PM

## 2023-06-18 NOTE — Patient Instructions (Signed)
 Take the 1 mg semaglutide weekly for 1 month.  Then increase to 2mg  next month..   If your leg worsens please go to ED.

## 2023-06-18 NOTE — Telephone Encounter (Signed)
 Patient has appt with NP scheduled

## 2023-07-03 ENCOUNTER — Ambulatory Visit (HOSPITAL_BASED_OUTPATIENT_CLINIC_OR_DEPARTMENT_OTHER): Payer: Managed Care, Other (non HMO) | Admitting: Cardiovascular Disease

## 2023-08-06 ENCOUNTER — Encounter (HOSPITAL_BASED_OUTPATIENT_CLINIC_OR_DEPARTMENT_OTHER): Admitting: General Surgery

## 2023-08-19 ENCOUNTER — Inpatient Hospital Stay: Payer: Managed Care, Other (non HMO) | Admitting: Oncology

## 2023-08-19 ENCOUNTER — Inpatient Hospital Stay: Payer: Managed Care, Other (non HMO) | Attending: Oncology

## 2023-08-19 VITALS — BP 150/98 | HR 86 | Temp 98.3°F | Resp 21 | Wt >= 6400 oz

## 2023-08-19 DIAGNOSIS — Z800 Family history of malignant neoplasm of digestive organs: Secondary | ICD-10-CM | POA: Insufficient documentation

## 2023-08-19 DIAGNOSIS — L97909 Non-pressure chronic ulcer of unspecified part of unspecified lower leg with unspecified severity: Secondary | ICD-10-CM | POA: Diagnosis not present

## 2023-08-19 DIAGNOSIS — Z1211 Encounter for screening for malignant neoplasm of colon: Secondary | ICD-10-CM | POA: Diagnosis not present

## 2023-08-19 DIAGNOSIS — D509 Iron deficiency anemia, unspecified: Secondary | ICD-10-CM | POA: Diagnosis present

## 2023-08-19 DIAGNOSIS — D751 Secondary polycythemia: Secondary | ICD-10-CM | POA: Diagnosis not present

## 2023-08-19 DIAGNOSIS — Z86718 Personal history of other venous thrombosis and embolism: Secondary | ICD-10-CM | POA: Insufficient documentation

## 2023-08-19 DIAGNOSIS — Z862 Personal history of diseases of the blood and blood-forming organs and certain disorders involving the immune mechanism: Secondary | ICD-10-CM

## 2023-08-19 DIAGNOSIS — G4733 Obstructive sleep apnea (adult) (pediatric): Secondary | ICD-10-CM | POA: Diagnosis not present

## 2023-08-19 LAB — FERRITIN: Ferritin: 211 ng/mL (ref 24–336)

## 2023-08-19 LAB — CBC WITH DIFFERENTIAL (CANCER CENTER ONLY)
Abs Immature Granulocytes: 0.02 10*3/uL (ref 0.00–0.07)
Basophils Absolute: 0 10*3/uL (ref 0.0–0.1)
Basophils Relative: 0 %
Eosinophils Absolute: 0.2 10*3/uL (ref 0.0–0.5)
Eosinophils Relative: 3 %
HCT: 39.2 % (ref 39.0–52.0)
Hemoglobin: 12.5 g/dL — ABNORMAL LOW (ref 13.0–17.0)
Immature Granulocytes: 0 %
Lymphocytes Relative: 20 %
Lymphs Abs: 1.4 10*3/uL (ref 0.7–4.0)
MCH: 25.5 pg — ABNORMAL LOW (ref 26.0–34.0)
MCHC: 31.9 g/dL (ref 30.0–36.0)
MCV: 80 fL (ref 80.0–100.0)
Monocytes Absolute: 0.7 10*3/uL (ref 0.1–1.0)
Monocytes Relative: 9 %
Neutro Abs: 4.9 10*3/uL (ref 1.7–7.7)
Neutrophils Relative %: 68 %
Platelet Count: 220 10*3/uL (ref 150–400)
RBC: 4.9 MIL/uL (ref 4.22–5.81)
RDW: 16.5 % — ABNORMAL HIGH (ref 11.5–15.5)
WBC Count: 7.3 10*3/uL (ref 4.0–10.5)
nRBC: 0 % (ref 0.0–0.2)

## 2023-08-19 LAB — CMP (CANCER CENTER ONLY)
ALT: 10 U/L (ref 0–44)
AST: 15 U/L (ref 15–41)
Albumin: 4 g/dL (ref 3.5–5.0)
Alkaline Phosphatase: 62 U/L (ref 38–126)
Anion gap: 13 (ref 5–15)
BUN: 16 mg/dL (ref 6–20)
CO2: 25 mmol/L (ref 22–32)
Calcium: 9.8 mg/dL (ref 8.9–10.3)
Chloride: 99 mmol/L (ref 98–111)
Creatinine: 1.03 mg/dL (ref 0.61–1.24)
GFR, Estimated: >60 mL/min (ref >60)
Glucose, Bld: 108 mg/dL — ABNORMAL HIGH (ref 70–99)
Potassium: 3.6 mmol/L (ref 3.5–5.1)
Sodium: 137 mmol/L (ref 135–145)
Total Bilirubin: 0.3 mg/dL (ref 0.0–1.2)
Total Protein: 8.4 g/dL — ABNORMAL HIGH (ref 6.5–8.1)

## 2023-08-19 LAB — IRON AND TIBC
Iron: 36 ug/dL — ABNORMAL LOW (ref 45–182)
Saturation Ratios: 12 % — ABNORMAL LOW (ref 17.9–39.5)
TIBC: 294 ug/dL (ref 250–450)
UIBC: 258 ug/dL

## 2023-08-19 NOTE — Progress Notes (Signed)
 [name]  HEMATOLOGY CLINIC PROGRESS NOTE  PATIENT NAME: [name]   MR#: [id number] DOB: [birth year]  Patient Care Team: Shawn Abo, MD as PCP - General (Family Medicine) Shawn Sos, MD as PCP - Cardiology (Cardiology) Shawn Curia, NP as Nurse Practitioner (Cardiology)  Date of visit: 08/19/2023   ASSESSMENT & PLAN:   [name] is a 43 y.o. gentleman with past medical history of morbid obesity, OSA on CPAP, hypertension, chronic venous insufficiency, lymphedema, hx of DVT in 2000s, was referred to our clinic in January 2025 for evaluation of polycythemia.  Workup consistent with secondary polycythemia, likely related to OSA and obesity hypoventilation syndrome.  Lately he has been having microcytic anemia with mild iron deficiency.  History of polycythemia On 02/14/2023 he was found to have elevated hemoglobin (18.1) and hematocrit (58.8).   Discussed risks of high red count including clots, strokes, and heart problems.   On his initial consultation with us  on 03/24/2023, labs actually showed improved hemoglobin at 12.4, hematocrit 40, MCV 78.6.  White count and platelet count were also normal.  We did submit request for iron studies, JAK2 mutation analysis prior to CBC results.  JAK2 testing, CALR, MPL mutations, exon 12-15 mutations were all negative.  Iron saturation was low at 11%, ferritin normal at 83.  Clinical picture was consistent with secondary polycythemia, related to sleep apnea and also contributed by obesity hypoventilation syndrome.  Plan was to maintain hematocrit below 55 with therapeutic phlebotomies as needed.  However lately he has been having mild microcytic anemia with evidence of mild iron deficiency.  Chronic intermittent bleeding from leg ulcers could be contributing.  Labs today revealed hemoglobin of 12.5, hematocrit 39.2, MCV 80.  White count and platelet count are within normal limits.  CMP grossly unremarkable.  Iron  studies continue to show mild iron deficiency with iron saturation of 12%, iron decreased at 36.  Ferritin normal at 211.  We will not try to correct iron deficiency in his case, as it can exacerbate polycythemia.  As [name] as hemoglobin is above 11, we will monitor for now.  No indication for therapeutic phlebotomy because of reasons mentioned above.  Referral sent to gastroenterology for further workup of iron deficiency state.  Family history of colon cancer in father, deceased at age 20.  We will space out clinic intervals.  RTC in 1 year for follow-up.  History of DVT of lower extremity Treated with Coumadin in the early 2000s. - Monitor for signs of thrombosis  Leg ulcers Leg ulcers with intermittent bleeding, potentially contributing to mild anemia.  I spent a total of 20 minutes during this encounter with the patient including review of chart and various tests results, discussions about plan of care and coordination of care plan.  I reviewed lab results and outside records for this visit and discussed relevant results with the patient. Diagnosis, plan of care and treatment options were also discussed in detail with the patient. Opportunity provided to ask questions and answers provided to his apparent satisfaction. Provided instructions to call our clinic with any problems, questions or concerns prior to return visit. I recommended to continue follow-up with PCP and sub-specialists. He verbalized understanding and agreed with the plan. No barriers to learning was detected.  Shawn Berber, MD  08/19/2023 3:34 PM  [facility] - A DEPT OF [name]. [facility] [id number] [place] [address] Dept: [phone] Dept Fax:  [phone]   CHIEF COMPLAINT/ REASON FOR VISIT:  Initially referred for secondary polycythemia, related to OSA and likely contributed by obesity hypoventilation syndrome.  Currently he has  microcytic anemia from mild iron deficiency.  INTERVAL HISTORY:  Discussed the use of AI scribe software for clinical note transcription with the patient, who gave verbal consent to proceed.  History of Present Illness [name] is a 43 year old male who presents for follow-up of anemia. He was referred for evaluation of high red blood cell count.  Initially referred for evaluation of high red blood cell count, his hemoglobin level was 18.1 in December of the previous year. Since January, hemoglobin levels have decreased, with recent measurements of 12.4, 12.3, and 12.5. Iron levels were also noted to be slightly low.  He reports no major changes in his condition over the past four months. No clotting problems currently, although he had a clot in the past. He has not undergone a colonoscopy before and follows a regular diet. No blood in stools, black stools, or other bleeding problems, except for leg ulcers that bleed occasionally.  His father had colon cancer and passed away at the age of 48. He reports that his stools have been more watery lately but denies any bloody stools.    SUMMARY OF HEMATOLOGIC HISTORY:  He was found to have abnormal CBC from 02/14/2023.  His hemoglobin was 18.1, hematocrit 58.8, MCV 81.  White count was 10,500.  No differential was obtained.  Platelet count was normal at 378,000.  He was referred to us  for further evaluation of polycythemia.   This was the first time the patient was informed of having a high red blood cell count. Previous lab results showed the patient's red blood cell count as sometimes low, but mostly normal. The recent lab results showed a hemoglobin level of 18.1 (normal up to 17) and a hematocrit level of 58.8. The patient has not been on any testosterone injections and does not smoke. The patient also has a history of ulcers in the legs, which has led to a fluid restriction.   He denies intermittent headaches, shortness of breath on exertion,  dizziness, frequent leg cramps or chest pain.    He has a hx of blood clot in the leg in early 2000s. He was on coumadin at that time and took it for up to an year.    He has prior diagnosis of obstructive sleep apnea and claims to be compliant with CPAP.  On 02/14/2023 he was found to have elevated hemoglobin (18.1) and hematocrit (58.8).   Discussed risks of high red count including clots, strokes, and heart problems.   On his initial consultation with us  on 03/24/2023, labs actually showed improved hemoglobin at 12.4, hematocrit 40, MCV 78.6.  White count and platelet count were also normal.  We did submit request for iron studies, JAK2 mutation analysis prior to CBC results.  JAK2 testing, CALR, MPL mutations, exon 12-15 mutations were all negative.  Iron saturation was low at 11%, ferritin normal at 83.  Clinical picture was consistent with secondary polycythemia, related to sleep apnea and also contributed by obesity hypoventilation syndrome.  Plan was to maintain hematocrit below 55 with therapeutic phlebotomies as needed.  However lately he has been having mild microcytic anemia with evidence of mild iron deficiency.  Chronic intermittent bleeding from leg ulcers could be contributing.  He also has history of DVT of lower extremity, treated with Coumadin in the early 2000s.  I have reviewed the past medical history, past surgical history, social history and family history with the patient and they are unchanged from previous note.  ALLERGIES: He is allergic to entresto  [sacubitril -valsartan ], lidocaine , other, peanut-containing drug products, hydrofera blue 4x4 [wound dressings], and latex.  MEDICATIONS:  Current Outpatient Medications  Medication Sig Dispense Refill   Accu-Chek Softclix Lancets lancets Use as instructed 100 each 12   Blood Glucose Monitoring Suppl (ACCU-CHEK GUIDE ME) w/Device KIT USE UP TO 4 TIMES A DAY AS DIRECTED 1 kit 0   carvedilol  (COREG ) 25 MG tablet TAKE  1 TABLET BY MOUTH TWICE A DAY 180 tablet 0   dapagliflozin  propanediol (FARXIGA ) 10 MG TABS tablet Take 1 tablet (10 mg total) by mouth daily before breakfast. 30 tablet 11   dapagliflozin  propanediol (FARXIGA ) 10 MG TABS tablet Take 1 tablet (10 mg total) by mouth daily before breakfast. 7 tablet 0   EPINEPHrine  0.3 mg/0.3 mL IJ SOAJ injection Inject 0.3 mg into the muscle as needed for anaphylaxis. 1 each 0   furosemide  (LASIX ) 80 MG tablet Take 1 tablet (80 mg total) by mouth 2 (two) times daily. 180 tablet 3   gabapentin  (NEURONTIN ) 300 MG capsule Take 1 capsule (300 mg total) by mouth 3 (three) times daily. 90 capsule 3   glucose blood (ACCU-CHEK GUIDE) test strip Use as instructed 100 each 12   hydrALAZINE  (APRESOLINE ) 50 MG tablet TAKE 1 TABLET (50 MG) BY MOUTH IN THE MORNING AND AT BEDTIME 60 tablet 11   metFORMIN  (GLUCOPHAGE ) 500 MG tablet Take 1 tablet (500 mg total) by mouth daily with breakfast. 90 tablet 1   Semaglutide , 2 MG/DOSE, 8 MG/3ML SOPN Inject 2 mg as directed once a week. 3 mL 1   spironolactone  (ALDACTONE ) 25 MG tablet Take 1 tablet (25 mg total) by mouth daily. 90 tablet 3   traMADol  (ULTRAM ) 50 MG tablet Take 1 tablet (50 mg total) by mouth every 6 (six) hours as needed for moderate pain (pain score 4-6). 30 tablet 0   No current facility-administered medications for this visit.     REVIEW OF SYSTEMS:    ROS  All other pertinent systems were reviewed with the patient and are negative.  PHYSICAL EXAMINATION:     Vitals:   08/19/23 1511  BP: (!) 150/98  Pulse: 86  Resp: (!) 21  Temp: 98.3 F (36.8 C)  SpO2: 96%   Filed Weights   08/19/23 1511  Weight: (!) 493 lb 4.8 oz (223.8 kg)    Physical Exam Constitutional:      General: He is not in acute distress.    Appearance: He is obese.  HENT:     Head: Normocephalic and atraumatic.  Eyes:     General: No scleral icterus.    Conjunctiva/sclera: Conjunctivae normal.  Cardiovascular:     Rate and  Rhythm: Normal rate and regular rhythm.  Pulmonary:     Effort: Pulmonary effort is normal.  Neurological:     General: No focal deficit present.     Mental Status: He is alert and oriented to person, place, and time.  Psychiatric:        Mood and Affect: Mood normal.        Behavior: Behavior normal.     LABORATORY DATA:   I have reviewed the data as listed.  Results for orders placed or performed in visit on 08/19/23  Ferritin  Result Value Ref Range   Ferritin 211 24 - 336 ng/mL  Iron and TIBC  Result Value Ref Range   Iron 36 (L) 45 - 182 ug/dL   TIBC 960 454 - 098 ug/dL   Saturation Ratios 12 (L) 17.9 - 39.5 %   UIBC 258 ug/dL  CMP (Cancer Center only)  Result Value Ref Range   Sodium 137 135 - 145 mmol/L   Potassium 3.6 3.5 - 5.1 mmol/L   Chloride 99 98 - 111 mmol/L   CO2 25 22 - 32 mmol/L   Glucose, Bld 108 (H) 70 - 99 mg/dL   BUN 16 6 - 20 mg/dL   Creatinine 1.19 1.47 - 1.24 mg/dL   Calcium 9.8 8.9 - 82.9 mg/dL   Total Protein 8.4 (H) 6.5 - 8.1 g/dL   Albumin 4.0 3.5 - 5.0 g/dL   AST 15 15 - 41 U/L   ALT 10 0 - 44 U/L   Alkaline Phosphatase 62 38 - 126 U/L   Total Bilirubin 0.3 0.0 - 1.2 mg/dL   GFR, Estimated >56 >21 mL/min   Anion gap 13 5 - 15  CBC with Differential (Cancer Center Only)  Result Value Ref Range   WBC Count 7.3 4.0 - 10.5 K/uL   RBC 4.90 4.22 - 5.81 MIL/uL   Hemoglobin 12.5 (L) 13.0 - 17.0 g/dL   HCT 30.8 65.7 - 84.6 %   MCV 80.0 80.0 - 100.0 fL   MCH 25.5 (L) 26.0 - 34.0 pg   MCHC 31.9 30.0 - 36.0 g/dL   RDW 96.2 (H) 95.2 - 84.1 %   Platelet Count 220 150 - 400 K/uL   nRBC 0.0 0.0 - 0.2 %   Neutrophils Relative % 68 %   Neutro Abs 4.9 1.7 - 7.7 K/uL   Lymphocytes Relative 20 %   Lymphs Abs 1.4 0.7 - 4.0 K/uL   Monocytes Relative 9 %   Monocytes Absolute 0.7 0.1 - 1.0 K/uL   Eosinophils Relative 3 %   Eosinophils Absolute 0.2 0.0 - 0.5 K/uL   Basophils Relative 0 %   Basophils Absolute 0.0 0.0 - 0.1 K/uL   Immature  Granulocytes 0 %   Abs Immature Granulocytes 0.02 0.00 - 0.07 K/uL    RADIOGRAPHIC STUDIES:  No recent pertinent imaging available to review  Orders Placed This Encounter  Procedures   CBC with Differential (Cancer Center Only)    Standing Status:   Future    Expected Date:   02/18/2024    Expiration Date:   08/18/2024   CMP (Cancer Center only)    Standing Status:   Future    Expected Date:   02/18/2024    Expiration Date:   08/18/2024   Iron and TIBC    Standing Status:   Future    Expected Date:   02/18/2024    Expiration Date:   08/18/2024   Ferritin    Standing Status:   Future    Expected Date:   02/18/2024    Expiration Date:   08/18/2024   Ambulatory referral to Gastroenterology    Referral Priority:   Routine    Referral Type:   Consultation    Referral Reason:   Specialty Services Required    Number of Visits Requested:   1     Future Appointments  Date Time Provider Department Center  09/17/2023  3:00 PM Shawn Abo, MD [facility] None     This document was completed utilizing speech recognition software. Grammatical errors, random word insertions, pronoun errors, and incomplete sentences are an occasional  consequence of this system due to software limitations, ambient noise, and hardware issues. Any formal questions or concerns about the content, text or information contained within the body of this dictation should be directly addressed to the provider for clarification.

## 2023-08-19 NOTE — Assessment & Plan Note (Addendum)
 On 02/14/2023 he was found to have elevated hemoglobin (18.1) and hematocrit (58.8).   Discussed risks of high red count including clots, strokes, and heart problems.   On his initial consultation with us  on 03/24/2023, labs actually showed improved hemoglobin at 12.4, hematocrit 40, MCV 78.6.  White count and platelet count were also normal.  We did submit request for iron studies, JAK2 mutation analysis prior to CBC results.  JAK2 testing, CALR, MPL mutations, exon 12-15 mutations were all negative.  Iron saturation was low at 11%, ferritin normal at 83.  Clinical picture was consistent with secondary polycythemia, related to sleep apnea and also contributed by obesity hypoventilation syndrome.  Plan was to maintain hematocrit below 55 with therapeutic phlebotomies as needed.  However lately he has been having mild microcytic anemia with evidence of mild iron deficiency.  Chronic intermittent bleeding from leg ulcers could be contributing.  Labs today revealed hemoglobin of 12.5, hematocrit 39.2, MCV 80.  White count and platelet count are within normal limits.  CMP grossly unremarkable.  Iron studies continue to show mild iron deficiency with iron saturation of 12%, iron decreased at 36.  Ferritin normal at 211.  We will not try to correct iron deficiency in his case, as it can exacerbate polycythemia.  As [place] as hemoglobin is above 11, we will monitor for now.  No indication for therapeutic phlebotomy because of reasons mentioned above.  Referral sent to gastroenterology for further workup of iron deficiency state.  Family history of colon cancer in father, deceased at age 61.  We will space out clinic intervals.  RTC in 1 year for follow-up.

## 2023-08-19 NOTE — Assessment & Plan Note (Signed)
 Treated with Coumadin in the early 2000s. - Monitor for signs of thrombosis

## 2023-08-26 ENCOUNTER — Encounter: Payer: Self-pay | Admitting: Oncology

## 2023-09-04 ENCOUNTER — Inpatient Hospital Stay (HOSPITAL_BASED_OUTPATIENT_CLINIC_OR_DEPARTMENT_OTHER)
Admission: EM | Admit: 2023-09-04 | Discharge: 2023-09-11 | DRG: 603 | Disposition: A | Discharge status: Home Health | Attending: Family Medicine | Admitting: Family Medicine

## 2023-09-04 ENCOUNTER — Encounter (HOSPITAL_BASED_OUTPATIENT_CLINIC_OR_DEPARTMENT_OTHER): Payer: Self-pay | Admitting: *Deleted

## 2023-09-04 ENCOUNTER — Other Ambulatory Visit: Payer: Self-pay

## 2023-09-04 DIAGNOSIS — Z7984 Long term (current) use of oral hypoglycemic drugs: Secondary | ICD-10-CM

## 2023-09-04 DIAGNOSIS — I509 Heart failure, unspecified: Secondary | ICD-10-CM | POA: Diagnosis present

## 2023-09-04 DIAGNOSIS — I872 Venous insufficiency (chronic) (peripheral): Secondary | ICD-10-CM | POA: Diagnosis present

## 2023-09-04 DIAGNOSIS — I890 Lymphedema, not elsewhere classified: Secondary | ICD-10-CM | POA: Diagnosis present

## 2023-09-04 DIAGNOSIS — E1142 Type 2 diabetes mellitus with diabetic polyneuropathy: Secondary | ICD-10-CM | POA: Diagnosis present

## 2023-09-04 DIAGNOSIS — D751 Secondary polycythemia: Secondary | ICD-10-CM | POA: Diagnosis present

## 2023-09-04 DIAGNOSIS — I878 Other specified disorders of veins: Secondary | ICD-10-CM | POA: Diagnosis present

## 2023-09-04 DIAGNOSIS — Z888 Allergy status to other drugs, medicaments and biological substances status: Secondary | ICD-10-CM

## 2023-09-04 DIAGNOSIS — I83013 Varicose veins of right lower extremity with ulcer of ankle: Secondary | ICD-10-CM | POA: Diagnosis present

## 2023-09-04 DIAGNOSIS — L97312 Non-pressure chronic ulcer of right ankle with fat layer exposed: Secondary | ICD-10-CM | POA: Diagnosis present

## 2023-09-04 DIAGNOSIS — R599 Enlarged lymph nodes, unspecified: Secondary | ICD-10-CM | POA: Diagnosis present

## 2023-09-04 DIAGNOSIS — E662 Morbid (severe) obesity with alveolar hypoventilation: Secondary | ICD-10-CM | POA: Diagnosis present

## 2023-09-04 DIAGNOSIS — L0390 Cellulitis, unspecified: Secondary | ICD-10-CM | POA: Diagnosis present

## 2023-09-04 DIAGNOSIS — Z91040 Latex allergy status: Secondary | ICD-10-CM

## 2023-09-04 DIAGNOSIS — Z86718 Personal history of other venous thrombosis and embolism: Secondary | ICD-10-CM | POA: Diagnosis not present

## 2023-09-04 DIAGNOSIS — I83009 Varicose veins of unspecified lower extremity with ulcer of unspecified site: Secondary | ICD-10-CM

## 2023-09-04 DIAGNOSIS — Z95820 Peripheral vascular angioplasty status with implants and grafts: Secondary | ICD-10-CM | POA: Diagnosis not present

## 2023-09-04 DIAGNOSIS — I472 Ventricular tachycardia, unspecified: Secondary | ICD-10-CM | POA: Diagnosis present

## 2023-09-04 DIAGNOSIS — R5383 Other fatigue: Secondary | ICD-10-CM | POA: Diagnosis present

## 2023-09-04 DIAGNOSIS — Z79899 Other long term (current) drug therapy: Secondary | ICD-10-CM

## 2023-09-04 DIAGNOSIS — I110 Hypertensive heart disease with heart failure: Secondary | ICD-10-CM | POA: Diagnosis present

## 2023-09-04 DIAGNOSIS — G8929 Other chronic pain: Secondary | ICD-10-CM | POA: Diagnosis present

## 2023-09-04 DIAGNOSIS — D509 Iron deficiency anemia, unspecified: Secondary | ICD-10-CM | POA: Diagnosis present

## 2023-09-04 DIAGNOSIS — Z881 Allergy status to other antibiotic agents status: Secondary | ICD-10-CM

## 2023-09-04 DIAGNOSIS — L03115 Cellulitis of right lower limb: Secondary | ICD-10-CM | POA: Diagnosis present

## 2023-09-04 DIAGNOSIS — Z6841 Body Mass Index (BMI) 40.0 and over, adult: Secondary | ICD-10-CM

## 2023-09-04 DIAGNOSIS — Z8619 Personal history of other infectious and parasitic diseases: Secondary | ICD-10-CM

## 2023-09-04 DIAGNOSIS — E871 Hypo-osmolality and hyponatremia: Secondary | ICD-10-CM | POA: Diagnosis present

## 2023-09-04 DIAGNOSIS — Z833 Family history of diabetes mellitus: Secondary | ICD-10-CM

## 2023-09-04 DIAGNOSIS — M7989 Other specified soft tissue disorders: Secondary | ICD-10-CM | POA: Diagnosis not present

## 2023-09-04 DIAGNOSIS — Z884 Allergy status to anesthetic agent status: Secondary | ICD-10-CM

## 2023-09-04 DIAGNOSIS — Z800 Family history of malignant neoplasm of digestive organs: Secondary | ICD-10-CM

## 2023-09-04 LAB — CBC
HCT: 34.9 % — ABNORMAL LOW (ref 39.0–52.0)
Hemoglobin: 11.3 g/dL — ABNORMAL LOW (ref 13.0–17.0)
MCH: 25.6 pg — ABNORMAL LOW (ref 26.0–34.0)
MCHC: 32.4 g/dL (ref 30.0–36.0)
MCV: 79 fL — ABNORMAL LOW (ref 80.0–100.0)
Platelets: 198 10*3/uL (ref 150–400)
RBC: 4.42 MIL/uL (ref 4.22–5.81)
RDW: 16.9 % — ABNORMAL HIGH (ref 11.5–15.5)
WBC: 14.9 10*3/uL — ABNORMAL HIGH (ref 4.0–10.5)
nRBC: 0 % (ref 0.0–0.2)

## 2023-09-04 LAB — LACTIC ACID, PLASMA: Lactic Acid, Venous: 1.3 mmol/L (ref 0.5–1.9)

## 2023-09-04 LAB — URINALYSIS, ROUTINE W REFLEX MICROSCOPIC
Bacteria, UA: NONE SEEN
Bilirubin Urine: NEGATIVE
Glucose, UA: NEGATIVE mg/dL
Ketones, ur: NEGATIVE mg/dL
Leukocytes,Ua: NEGATIVE
Nitrite: NEGATIVE
Protein, ur: 100 mg/dL — AB
Specific Gravity, Urine: 1.02 (ref 1.005–1.030)
pH: 6 (ref 5.0–8.0)

## 2023-09-04 LAB — COMPREHENSIVE METABOLIC PANEL WITH GFR
ALT: 20 U/L (ref 0–44)
AST: 23 U/L (ref 15–41)
Albumin: 3.5 g/dL (ref 3.5–5.0)
Alkaline Phosphatase: 117 U/L (ref 38–126)
Anion gap: 11 (ref 5–15)
BUN: 13 mg/dL (ref 6–20)
CO2: 26 mmol/L (ref 22–32)
Calcium: 9 mg/dL (ref 8.9–10.3)
Chloride: 96 mmol/L — ABNORMAL LOW (ref 98–111)
Creatinine, Ser: 1.08 mg/dL (ref 0.61–1.24)
GFR, Estimated: >60 mL/min (ref >60)
Glucose, Bld: 107 mg/dL — ABNORMAL HIGH (ref 70–99)
Potassium: 3.2 mmol/L — ABNORMAL LOW (ref 3.5–5.1)
Sodium: 134 mmol/L — ABNORMAL LOW (ref 135–145)
Total Bilirubin: 1.2 mg/dL (ref 0.0–1.2)
Total Protein: 8.2 g/dL — ABNORMAL HIGH (ref 6.5–8.1)

## 2023-09-04 MED ORDER — VANCOMYCIN HCL IN DEXTROSE 1-5 GM/200ML-% IV SOLN: 1000.0000 mg | Freq: Once | INTRAVENOUS | Status: DC

## 2023-09-04 MED ORDER — SODIUM CHLORIDE 0.9 % IV SOLN
2.0000 g | Freq: Once | INTRAVENOUS | Status: AC
  Administered 2023-09-04: 2 g via INTRAVENOUS
  Filled 2023-09-04: qty 20

## 2023-09-04 MED ORDER — VANCOMYCIN HCL IN DEXTROSE 1-5 GM/200ML-% IV SOLN
1000.0000 mg | Freq: Once | INTRAVENOUS | Status: DC
  Filled 2023-09-04: qty 200

## 2023-09-04 MED ORDER — LINEZOLID 600 MG/300ML IV SOLN
600.0000 mg | Freq: Once | INTRAVENOUS | Status: AC
  Administered 2023-09-04: 600 mg via INTRAVENOUS
  Filled 2023-09-04: qty 300

## 2023-09-04 NOTE — ED Provider Notes (Addendum)
 [place] EMERGENCY DEPARTMENT AT [facility] Provider Note   CSN: [id number] Arrival date & time: 09/04/23  1756     Patient presents with: Leg Swelling   [name] is a 43 y.o. male.   HPI Patient has history of lymphedema and wounds on the right lower extremity.  The patient's wife reports that she has cared for the wounds for several years and he is going back to wound clinic again more recently.  They are doing dressing changes and the lower leg wound has opened back up.  Patient reports he was feeling fine and then yesterday he started to get chills throughout the day he felt more fatigued and somewhat ill.  Patient's wife reports that she noted yesterday that there was some increasing redness coming up the leg and then today it got significantly more swollen and deeply red and warm to the touch up to the upper thigh.  She reports it is not typically red up his leg like that and the wound itself is started to drain much more aggressively.  Patient denies any shortness of breath or chest pain.  He denies any vomiting or abdominal pain.    Prior to Admission medications   Medication Sig Start Date End Date Taking? Authorizing Provider  Accu-Chek Softclix Lancets lancets Use as instructed 04/15/22   Tanda Bleacher, MD  Blood Glucose Monitoring Suppl (ACCU-CHEK GUIDE ME) w/Device KIT USE UP TO 4 TIMES A DAY AS DIRECTED 03/31/22   Tanda Bleacher, MD  carvedilol  (COREG ) 25 MG tablet TAKE 1 TABLET BY MOUTH TWICE A DAY 04/18/23   Tanda Bleacher, MD  dapagliflozin  propanediol (FARXIGA ) 10 MG TABS tablet Take 1 tablet (10 mg total) by mouth daily before breakfast. 04/11/23   Walker, Caitlin S, NP  dapagliflozin  propanediol (FARXIGA ) 10 MG TABS tablet Take 1 tablet (10 mg total) by mouth daily before breakfast. 04/11/23   Walker, Caitlin S, NP  EPINEPHrine  0.3 mg/0.3 mL IJ SOAJ injection Inject 0.3 mg into the muscle as needed for anaphylaxis. 09/21/21   Levander Houston, MD  furosemide   (LASIX ) 80 MG tablet Take 1 tablet (80 mg total) by mouth 2 (two) times daily. 04/11/23 04/05/24  Vannie Reche RAMAN, NP  gabapentin  (NEURONTIN ) 300 MG capsule Take 1 capsule (300 mg total) by mouth 3 (three) times daily. 06/18/23   Danton Jon HERO, PA-C  glucose blood (ACCU-CHEK GUIDE) test strip Use as instructed 04/15/22   Tanda Bleacher, MD  hydrALAZINE  (APRESOLINE ) 50 MG tablet TAKE 1 TABLET (50 MG) BY MOUTH IN THE MORNING AND AT BEDTIME 05/27/23   Walker, Caitlin S, NP  metFORMIN  (GLUCOPHAGE ) 500 MG tablet Take 1 tablet (500 mg total) by mouth daily with breakfast. 06/18/23   Danton Jon HERO, PA-C  Semaglutide , 2 MG/DOSE, 8 MG/3ML SOPN Inject 2 mg as directed once a week. 07/18/23   Danton Jon HERO, PA-C  spironolactone  (ALDACTONE ) 25 MG tablet Take 1 tablet (25 mg total) by mouth daily. 04/11/23   Walker, Caitlin S, NP  traMADol  (ULTRAM ) 50 MG tablet Take 1 tablet (50 mg total) by mouth every 6 (six) hours as needed for moderate pain (pain score 4-6). 06/18/23   Danton Jon HERO, PA-C    Allergies: Entresto  [sacubitril -valsartan ], Lidocaine , Other, Peanut-containing drug products, Vancomycin , Hydrofera blue 4x4 [wound dressings], and Latex    Review of Systems  Updated Vital Signs BP (!) 150/83   Pulse 84   Temp 98 F (36.7 C)   Resp 20   Wt (!) 230.2  kg   SpO2 99%   BMI 65.17 kg/m   Physical Exam Constitutional:      Comments: Alert with clear mental status.  No respiratory distress.  Nontoxic.  HENT:     Mouth/Throat:     Pharynx: Oropharynx is clear.   Eyes:     Extraocular Movements: Extraocular movements intact.    Cardiovascular:     Rate and Rhythm: Normal rate and regular rhythm.  Pulmonary:     Effort: Pulmonary effort is normal.     Breath sounds: Normal breath sounds.  Abdominal:     Palpations: Abdomen is soft.     Tenderness: There is no abdominal tenderness.   Musculoskeletal:     Comments: Patient has extreme edema of the right lower extremity.  There is  a large open ulcerated wound to the lower leg and warm erythema of the thigh.  There is a distinct demarcation high on the thigh.  See attached images.   Skin:    General: Skin is warm and dry.   Neurological:     General: No focal deficit present.     Mental Status: He is oriented to person, place, and time.   Psychiatric:        Mood and Affect: Mood normal.     (all labs ordered are listed, but only abnormal results are displayed) Labs Reviewed  COMPREHENSIVE METABOLIC PANEL WITH GFR - Abnormal; Notable for the following components:      Result Value   Sodium 134 (*)    Potassium 3.2 (*)    Chloride 96 (*)    Glucose, Bld 107 (*)    Total Protein 8.2 (*)    All other components within normal limits  CBC - Abnormal; Notable for the following components:   WBC 14.9 (*)    Hemoglobin 11.3 (*)    HCT 34.9 (*)    MCV 79.0 (*)    MCH 25.6 (*)    RDW 16.9 (*)    All other components within normal limits  URINALYSIS, ROUTINE W REFLEX MICROSCOPIC - Abnormal; Notable for the following components:   Hgb urine dipstick SMALL (*)    Protein, ur 100 (*)    All other components within normal limits  CULTURE, BLOOD (ROUTINE X 2)  CULTURE, BLOOD (ROUTINE X 2)  LACTIC ACID, PLASMA    EKG: None  Radiology: No results found.   Procedures   Medications Ordered in the ED  linezolid (ZYVOX) IVPB 600 mg (has no administration in time range)  cefTRIAXone  (ROCEPHIN ) 2 g in sodium chloride  0.9 % 100 mL IVPB (0 g Intravenous Stopped 09/04/23 2209)                                    Medical Decision Making Amount and/or Complexity of Data Reviewed Labs: ordered.  Risk Prescription drug management. Decision regarding hospitalization.   Patient presents as outlined.  He does have history of lymphedema and chronic wound.  However since yesterday he has developed chills malaise and warm erythema of the thigh.  At this time concerning for large area of cellulitis with open wound.   Will proceed with lab work and start antibiotics.  White count 14.9.  GFR greater than 60.  Urinalysis negative.  I had discussion with pharmacist decision making considering allergies, linezolid and Rocephin  chosen.  Patient has significant erythema and warmth from cellulitis hide to the thigh.  This  is not a typical distribution for him.  At this time I think he has significant secondary infection and large area of cellulitis.  In conjunction with significant medical comorbidities we will plan for admission and IV antibiotics.    Consult: Dr. Shona Triad hospitalist for admission. Final diagnoses:  Cellulitis of right lower extremity    ED Discharge Orders     None          Armenta Canning, MD 09/04/23 2215    Armenta Canning, MD 09/04/23 2248

## 2023-09-04 NOTE — ED Notes (Signed)
 First cultures, grey top and blue top sent to lab

## 2023-09-04 NOTE — ED Triage Notes (Addendum)
 Patient to ED with right sided leg swelling x 1 day, hx of lymphedema. Right leg red and warm to the touch. Wound to right lower leg seen by wound care. Wound opened back up per patient at last visit on Wednesday. Patient has been diaphoretic and feeling fatigued throughout the week. Patient denies shortness of breath in triage. No fevers.

## 2023-09-04 NOTE — ED Notes (Signed)
 X2 sets of blood cultures obtained before any abx started

## 2023-09-05 DIAGNOSIS — L03115 Cellulitis of right lower limb: Secondary | ICD-10-CM

## 2023-09-05 LAB — COMPREHENSIVE METABOLIC PANEL WITH GFR
ALT: 21 U/L (ref 0–44)
AST: 21 U/L (ref 15–41)
Albumin: 2.6 g/dL — ABNORMAL LOW (ref 3.5–5.0)
Alkaline Phosphatase: 92 U/L (ref 38–126)
Anion gap: 10 (ref 5–15)
BUN: 12 mg/dL (ref 6–20)
CO2: 26 mmol/L (ref 22–32)
Calcium: 8.1 mg/dL — ABNORMAL LOW (ref 8.9–10.3)
Chloride: 97 mmol/L — ABNORMAL LOW (ref 98–111)
Creatinine, Ser: 0.97 mg/dL (ref 0.61–1.24)
GFR, Estimated: >60 mL/min (ref >60)
Glucose, Bld: 134 mg/dL — ABNORMAL HIGH (ref 70–99)
Potassium: 3.5 mmol/L (ref 3.5–5.1)
Sodium: 133 mmol/L — ABNORMAL LOW (ref 135–145)
Total Bilirubin: 1.2 mg/dL (ref 0.0–1.2)
Total Protein: 7.4 g/dL (ref 6.5–8.1)

## 2023-09-05 LAB — CBC WITH DIFFERENTIAL/PLATELET
Abs Immature Granulocytes: 0.13 10*3/uL — ABNORMAL HIGH (ref 0.00–0.07)
Basophils Absolute: 0.1 10*3/uL (ref 0.0–0.1)
Basophils Relative: 0 %
Eosinophils Absolute: 0 10*3/uL (ref 0.0–0.5)
Eosinophils Relative: 0 %
HCT: 34.7 % — ABNORMAL LOW (ref 39.0–52.0)
Hemoglobin: 10.6 g/dL — ABNORMAL LOW (ref 13.0–17.0)
Immature Granulocytes: 1 %
Lymphocytes Relative: 6 %
Lymphs Abs: 0.9 10*3/uL (ref 0.7–4.0)
MCH: 24.7 pg — ABNORMAL LOW (ref 26.0–34.0)
MCHC: 30.5 g/dL (ref 30.0–36.0)
MCV: 80.7 fL (ref 80.0–100.0)
Monocytes Absolute: 1.9 10*3/uL — ABNORMAL HIGH (ref 0.1–1.0)
Monocytes Relative: 12 %
Neutro Abs: 12.7 10*3/uL — ABNORMAL HIGH (ref 1.7–7.7)
Neutrophils Relative %: 81 %
Platelets: 185 10*3/uL (ref 150–400)
RBC: 4.3 MIL/uL (ref 4.22–5.81)
RDW: 17.1 % — ABNORMAL HIGH (ref 11.5–15.5)
WBC: 15.7 10*3/uL — ABNORMAL HIGH (ref 4.0–10.5)
nRBC: 0 % (ref 0.0–0.2)

## 2023-09-05 LAB — MAGNESIUM: Magnesium: 2 mg/dL (ref 1.7–2.4)

## 2023-09-05 LAB — GLUCOSE, CAPILLARY
Glucose-Capillary: 139 mg/dL — ABNORMAL HIGH (ref 70–99)
Glucose-Capillary: 120 mg/dL — ABNORMAL HIGH (ref 70–99)
Glucose-Capillary: 106 mg/dL — ABNORMAL HIGH (ref 70–99)

## 2023-09-05 LAB — HEMOGLOBIN A1C
Hgb A1c MFr Bld: 6.3 % — ABNORMAL HIGH (ref 4.8–5.6)
Mean Plasma Glucose: 134.11 mg/dL

## 2023-09-05 MED ORDER — INSULIN ASPART 100 UNIT/ML IJ SOLN
0.0000 [IU] | Freq: Three times a day (TID) | INTRAMUSCULAR | Status: DC
  Administered 2023-09-05 – 2023-09-06 (×2): 2 [IU] via SUBCUTANEOUS
  Administered 2023-09-07: 3 [IU] via SUBCUTANEOUS
  Administered 2023-09-07 – 2023-09-11 (×6): 2 [IU] via SUBCUTANEOUS

## 2023-09-05 MED ORDER — OXYCODONE HCL 5 MG PO TABS
10.0000 mg | ORAL_TABLET | ORAL | Status: DC | PRN
  Administered 2023-09-05 – 2023-09-11 (×19): 10 mg via ORAL
  Filled 2023-09-05 (×21): qty 2

## 2023-09-05 MED ORDER — CARVEDILOL 25 MG PO TABS
25.0000 mg | ORAL_TABLET | Freq: Two times a day (BID) | ORAL | Status: DC
  Administered 2023-09-05 – 2023-09-11 (×13): 25 mg via ORAL
  Filled 2023-09-05 (×13): qty 1

## 2023-09-05 MED ORDER — DAPAGLIFLOZIN PROPANEDIOL 10 MG PO TABS
10.0000 mg | ORAL_TABLET | Freq: Every day | ORAL | Status: DC
  Administered 2023-09-05 – 2023-09-11 (×7): 10 mg via ORAL
  Filled 2023-09-05 (×7): qty 1

## 2023-09-05 MED ORDER — SODIUM CHLORIDE 0.9% FLUSH
3.0000 mL | Freq: Two times a day (BID) | INTRAVENOUS | Status: DC
  Administered 2023-09-05 – 2023-09-11 (×11): 3 mL via INTRAVENOUS

## 2023-09-05 MED ORDER — ACETAMINOPHEN 650 MG RE SUPP: 650.0000 mg | Freq: Four times a day (QID) | RECTAL | Status: DC | PRN

## 2023-09-05 MED ORDER — ACETAMINOPHEN 325 MG PO TABS
650.0000 mg | ORAL_TABLET | Freq: Four times a day (QID) | ORAL | Status: DC | PRN
Start: 2023-09-05 — End: 2023-09-11
  Administered 2023-09-05 – 2023-09-10 (×11): 650 mg via ORAL
  Filled 2023-09-05 (×11): qty 2

## 2023-09-05 MED ORDER — SPIRONOLACTONE 25 MG PO TABS
25.0000 mg | ORAL_TABLET | Freq: Every day | ORAL | Status: DC
  Administered 2023-09-05 – 2023-09-11 (×7): 25 mg via ORAL
  Filled 2023-09-05 (×7): qty 1

## 2023-09-05 MED ORDER — FUROSEMIDE 40 MG PO TABS
80.0000 mg | ORAL_TABLET | Freq: Two times a day (BID) | ORAL | Status: DC
  Administered 2023-09-05 – 2023-09-11 (×13): 80 mg via ORAL
  Filled 2023-09-05 (×13): qty 2

## 2023-09-05 MED ORDER — TRAMADOL HCL 50 MG PO TABS: 50.0000 mg | ORAL_TABLET | Freq: Four times a day (QID) | ORAL | Status: DC | PRN

## 2023-09-05 MED ORDER — ENOXAPARIN SODIUM 100 MG/ML IJ SOSY
100.0000 mg | PREFILLED_SYRINGE | INTRAMUSCULAR | Status: DC
  Administered 2023-09-05 – 2023-09-06 (×2): 100 mg via SUBCUTANEOUS
  Filled 2023-09-05 (×3): qty 1

## 2023-09-05 MED ORDER — MELATONIN 3 MG PO TABS: 3.0000 mg | ORAL_TABLET | Freq: Every evening | ORAL | Status: DC | PRN

## 2023-09-05 MED ORDER — ONDANSETRON HCL 4 MG/2ML IJ SOLN: 4.0000 mg | Freq: Four times a day (QID) | INTRAMUSCULAR | Status: DC | PRN

## 2023-09-05 MED ORDER — LINEZOLID 600 MG/300ML IV SOLN
600.0000 mg | Freq: Two times a day (BID) | INTRAVENOUS | Status: DC
  Administered 2023-09-05 – 2023-09-11 (×13): 600 mg via INTRAVENOUS
  Filled 2023-09-05 (×13): qty 300

## 2023-09-05 MED ORDER — NALOXONE HCL 0.4 MG/ML IJ SOLN: 0.4000 mg | INTRAMUSCULAR | Status: DC | PRN

## 2023-09-05 MED ORDER — INSULIN ASPART 100 UNIT/ML IJ SOLN: 0.0000 [IU] | Freq: Every day | INTRAMUSCULAR | Status: DC

## 2023-09-05 MED ORDER — SODIUM CHLORIDE 0.9 % IV SOLN
1.0000 g | INTRAVENOUS | Status: DC
  Administered 2023-09-05 – 2023-09-11 (×7): 1 g via INTRAVENOUS
  Filled 2023-09-05 (×7): qty 10

## 2023-09-05 MED ORDER — HYDROMORPHONE HCL 1 MG/ML IJ SOLN
0.5000 mg | INTRAMUSCULAR | Status: DC | PRN
  Administered 2023-09-05 – 2023-09-11 (×10): 0.5 mg via INTRAVENOUS
  Filled 2023-09-05 (×12): qty 0.5

## 2023-09-05 NOTE — Progress Notes (Signed)
   09/05/23 2141  BiPAP/CPAP/SIPAP  $ Non-Invasive Ventilator  Non-Invasive Vent Set Up  $ Face Mask Large  Yes  BiPAP/CPAP/SIPAP Pt Type Adult  BiPAP/CPAP/SIPAP Resmed  Mask Type Full face mask  Dentures removed? Not applicable  Mask Size Large  Patient Home Machine No  Patient Home Mask No  Patient Home Tubing No  Auto Titrate Yes  Minimum cmH2O 18 cmH2O  Maximum cmH2O 20 cmH2O  Device Plugged into [employer] Power Outlet Yes   CPAP for at bedtime. Machine was setup and mask fitted for patient use. He is comfortable with everything and will go on when ready.

## 2023-09-05 NOTE — TOC Initial Note (Signed)
 Transition of Care [facility]) - Initial/Assessment Note    Patient Details  Name: [name] MRN: [id number] Date of Birth: October 02, 1980  Transition of Care [facility]) CM/SW Contact:    Alfonse JONELLE Rex, RN Phone Number: 09/05/2023, 1:21 PM  Clinical Narrative:   Met with patient at bedside to introduce role of TOC/NCM and review for dc planning, pt confirmed he has an established PCP, no current home care  services or home DME, pt reports she resides with his wife who is a Engineer, civil (consulting) and has been assisting with his wound care, states he was also receiving OP wound care in [place]. TOC will continue to follow.                 Expected Discharge Plan: Home w Home Health Services Barriers to Discharge: Continued Medical Work up   Patient Goals and CMS Choice Patient states their goals for this hospitalization and ongoing recovery are:: return home          Expected Discharge Plan and Services       Living arrangements for the past 2 months: Single Family Home                                      Prior Living Arrangements/Services Living arrangements for the past 2 months: Single Family Home Lives with:: Spouse Patient language and need for interpreter reviewed:: Yes Do you feel safe going back to the place where you live?: Yes      Need for Family Participation in Patient Care: Yes (Comment) Care giver support system in place?: Yes (comment)   Criminal Activity/Legal Involvement Pertinent to Current Situation/Hospitalization: No - Comment as needed  Activities of Daily Living   ADL Screening (condition at time of admission) Independently performs ADLs?: Yes (appropriate for developmental age) Is the patient deaf or have difficulty hearing?: No Does the patient have difficulty seeing, even when wearing glasses/contacts?: No Does the patient have difficulty concentrating, remembering, or making decisions?: No  Permission Sought/Granted                  Emotional  Assessment Appearance:: Appears stated age Attitude/Demeanor/Rapport: Engaged Affect (typically observed): Accepting Orientation: : Oriented to Self, Oriented to Place, Oriented to  Time, Oriented to Situation Alcohol / Substance Use: Not Applicable Psych Involvement: No (comment)  Admission diagnosis:  Cellulitis [L03.90] Cellulitis of right lower extremity [L03.115] Patient Active Problem List   Diagnosis Date Noted   History of polycythemia 03/24/2023   History of DVT of lower extremity 03/24/2023   Posterior tibial tendinitis of left leg 02/05/2022   Prediabetes 06/24/2019   Lymphedema of both lower extremities 05/13/2016   Skin ulcer of calf with necrosis of muscle (HCC) 05/13/2016   Chronic congestive heart failure (HCC)    Generalized edema    NSVT (nonsustained ventricular tachycardia) (HCC) 03/25/2016   Hypokalemia 03/25/2016   Acute respiratory failure (HCC) 03/21/2016   SOB (shortness of breath) 03/20/2016   Acute respiratory failure with hypoxia (HCC) 03/20/2016   Abdominal pain 03/20/2016   OSA (obstructive sleep apnea) 03/20/2016   Type 2 diabetes mellitus (HCC) 12/29/2015   Wound infection    Hyperglycemia    Cellulitis 12/26/2015   Chronic acquired lymphedema 12/26/2015   Shortness of breath    Varicose veins of lower extremity with ulcer (HCC) 08/31/2012   Morbid obesity (HCC) 08/31/2012   Venous stasis ulcer (HCC) 01/09/2011  Hypertension 01/09/2011   Class 3 severe obesity due to excess calories with serious comorbidity and body mass index (BMI) of 60.0 to 69.9 in adult 01/09/2011   PCP:  Tanda Bleacher, MD Pharmacy:   [id number] - [place], [place] - [address] AT [facility] OF [address] [place] [address] Phone: [phone] Fax: [phone]  [id number] [place], [place] - [address] AT [facility] [address]  [place] [address] Phone: [phone]  Fax: [phone]  [facility] - [place], [place] - [address] [place] [address] Phone: [phone] Fax: [phone]  [place] [facility] [address] [place] [address] Phone: [phone] Fax: [phone]     Social Drivers of Health (SDOH) Social History: SDOH Screenings   Food Insecurity: No Food Insecurity (09/05/2023)  Housing: Low Risk  (09/05/2023)  Transportation Needs: No Transportation Needs (09/05/2023)  Utilities: Not At Risk (09/05/2023)  Alcohol Screen: Low Risk  (04/03/2023)  Depression (PHQ2-9): Low Risk  (04/03/2023)  Financial Resource Strain: Low Risk  (04/03/2023)  Physical Activity: Sufficiently Active (04/03/2023)  Social Connections: Socially Integrated (09/05/2023)  Stress: No Stress Concern Present (04/03/2023)  Tobacco Use: Low Risk  (09/04/2023)   SDOH Interventions:     Readmission Risk Interventions    09/05/2023    1:20 PM  Readmission Risk Prevention Plan  Post Dischage Appt Complete  Medication Screening Complete  Transportation Screening Complete

## 2023-09-05 NOTE — Consult Note (Signed)
 Reason for Consult:right leg wound Referring Physician: medicine  [name] is an 43 y.o. male.  HPI: [name] is a 43 y.o. male with medical history significant of OSA/OHS on CPAP, polycythemia followed by hematology in the outpatient setting, history of prior DVT, iron deficiency anemia obesity with a BMI greater than 64, diabetes mellitus on oral agents, history of NSVT, and chronic pain secondary to longstanding stasis ulcer wound in the right lower extremity as well as diabetic neuropathy.  Per outpatient vascular note patient is also undergone multiple greater saphenous vein procedures in the past.  Presented to the ED with worsening right lower edema over the past 24 hours.  Has chronic lymphedema.  Reported that the right leg was red and warm to the touch.  On exam this area was reddened up to the groin.  Chronic wound is followed by the wound care center in the outpatient setting.  He reported diaphoresis and feeling fatigued throughout the past 7 days.  No shortness of breath or fevers.   Past Medical History:  Diagnosis Date   Chronic venous insufficiency    a. as a premature infant, required venous access for care with subsequent DVT and some sort of procedure -> eventually went on to have vein bypass around 2005.   Hypertension    Lymphedema    Morbid obesity (HCC)    OSA on CPAP     Past Surgical History:  Procedure Laterality Date   balloon stenting Right    right leg   VEIN LIGATION AND STRIPPING      Family History  Problem Relation Age of Onset   Diabetes Mother    Cancer Father        colon   Cancer Maternal Grandmother    Cancer Maternal Grandfather     Social History:  reports that he has never smoked. He has never used smokeless tobacco. He reports current alcohol use of about 4.0 standard drinks of alcohol per week. He reports that he does not use drugs.  Allergies:  Allergies  Allergen Reactions   Entresto  [Sacubitril -Valsartan ] Swelling and Other  (See Comments)    Tongue swelling   Lidocaine  Hives, Shortness Of Breath and Other (See Comments)    burns my skin also- Patient ended up in the ED   Other Anaphylaxis, Swelling and Other (See Comments)    Tree nuts cause swelling   Peanut-Containing Drug Products Anaphylaxis and Swelling   Vancomycin  Anaphylaxis    From careeverywhere records ([facility])   Hydrofera Blue 4X4 [Wound Dressings] Nausea Only and Other (See Comments)    Made the patient light-headed and caused sweating also   Latex Hives and Other (See Comments)    Medications: I have reviewed the patient's current medications.  Results for orders placed or performed during the hospital encounter of 09/04/23 (from the past 48 hours)  Urinalysis, Routine w reflex microscopic -Urine, Clean Catch     Status: Abnormal   Collection Time: 09/04/23  6:15 PM  Result Value Ref Range   Color, Urine YELLOW YELLOW   APPearance CLEAR CLEAR   Specific Gravity, Urine 1.020 1.005 - 1.030   pH 6.0 5.0 - 8.0   Glucose, UA NEGATIVE NEGATIVE mg/dL   Hgb urine dipstick SMALL (A) NEGATIVE   Bilirubin Urine NEGATIVE NEGATIVE   Ketones, ur NEGATIVE NEGATIVE mg/dL   Protein, ur 899 (A) NEGATIVE mg/dL   Nitrite NEGATIVE NEGATIVE   Leukocytes,Ua NEGATIVE NEGATIVE   RBC / HPF 6-10 0 -  5 RBC/hpf   WBC, UA 0-5 0 - 5 WBC/hpf   Bacteria, UA NONE SEEN NONE SEEN   Squamous Epithelial / HPF 0-5 0 - 5 /HPF   Mucus PRESENT     Comment: Performed at [facility], [address] [place], [place] [address]  Comprehensive metabolic panel     Status: Abnormal   Collection Time: 09/04/23  6:48 PM  Result Value Ref Range   Sodium 134 (L) 135 - 145 mmol/L   Potassium 3.2 (L) 3.5 - 5.1 mmol/L   Chloride 96 (L) 98 - 111 mmol/L   CO2 26 22 - 32 mmol/L   Glucose, Bld 107 (H) 70 - 99 mg/dL    Comment: Glucose reference range applies only to samples taken after fasting for at least 8 hours.   BUN 13 6 - 20 mg/dL   Creatinine, Ser 8.91  0.61 - 1.24 mg/dL   Calcium 9.0 8.9 - 89.6 mg/dL   Total Protein 8.2 (H) 6.5 - 8.1 g/dL   Albumin 3.5 3.5 - 5.0 g/dL   AST 23 15 - 41 U/L   ALT 20 0 - 44 U/L   Alkaline Phosphatase 117 38 - 126 U/L   Total Bilirubin 1.2 0.0 - 1.2 mg/dL   GFR, Estimated >39 >39 mL/min    Comment: (NOTE) Calculated using the CKD-EPI Creatinine Equation (2021)    Anion gap 11 5 - 15    Comment: Performed at [facility], [address] [place], [place] [address]  CBC     Status: Abnormal   Collection Time: 09/04/23  6:48 PM  Result Value Ref Range   WBC 14.9 (H) 4.0 - 10.5 K/uL   RBC 4.42 4.22 - 5.81 MIL/uL   Hemoglobin 11.3 (L) 13.0 - 17.0 g/dL   HCT 65.0 (L) 60.9 - 47.9 %   MCV 79.0 (L) 80.0 - 100.0 fL   MCH 25.6 (L) 26.0 - 34.0 pg   MCHC 32.4 30.0 - 36.0 g/dL   RDW 83.0 (H) 88.4 - 84.4 %   Platelets 198 150 - 400 K/uL   nRBC 0.0 0.0 - 0.2 %    Comment: Performed at [facility], [address], [place], [place] [address]  Lactic acid, plasma     Status: None   Collection Time: 09/04/23  8:55 PM  Result Value Ref Range   Lactic Acid, Venous 1.3 0.5 - 1.9 mmol/L    Comment: Performed at [facility], [address], [place], [place] [address]  CBC with Differential/Platelet     Status: Abnormal   Collection Time: 09/05/23  3:30 AM  Result Value Ref Range   WBC 15.7 (H) 4.0 - 10.5 K/uL   RBC 4.30 4.22 - 5.81 MIL/uL   Hemoglobin 10.6 (L) 13.0 - 17.0 g/dL   HCT 65.2 (L) 60.9 - 47.9 %   MCV 80.7 80.0 - 100.0 fL   MCH 24.7 (L) 26.0 - 34.0 pg   MCHC 30.5 30.0 - 36.0 g/dL   RDW 82.8 (H) 88.4 - 84.4 %   Platelets 185 150 - 400 K/uL   nRBC 0.0 0.0 - 0.2 %   Neutrophils Relative % 81 %   Neutro Abs 12.7 (H) 1.7 - 7.7 K/uL   Lymphocytes Relative 6 %   Lymphs Abs 0.9 0.7 - 4.0 K/uL   Monocytes Relative 12 %   Monocytes Absolute 1.9 (H) 0.1 - 1.0 K/uL   Eosinophils Relative 0 %   Eosinophils Absolute 0.0 0.0 -  0.5 K/uL   Basophils  Relative 0 %   Basophils Absolute 0.1 0.0 - 0.1 K/uL   WBC Morphology MORPHOLOGY UNREMARKABLE    RBC Morphology MORPHOLOGY UNREMARKABLE    Smear Review MORPHOLOGY UNREMARKABLE    Immature Granulocytes 1 %   Abs Immature Granulocytes 0.13 (H) 0.00 - 0.07 K/uL    Comment: Performed at [facility], [address] [place], [place] [address]  Comprehensive metabolic panel with GFR     Status: Abnormal   Collection Time: 09/05/23  3:30 AM  Result Value Ref Range   Sodium 133 (L) 135 - 145 mmol/L   Potassium 3.5 3.5 - 5.1 mmol/L   Chloride 97 (L) 98 - 111 mmol/L   CO2 26 22 - 32 mmol/L   Glucose, Bld 134 (H) 70 - 99 mg/dL    Comment: Glucose reference range applies only to samples taken after fasting for at least 8 hours.   BUN 12 6 - 20 mg/dL   Creatinine, Ser 9.02 0.61 - 1.24 mg/dL   Calcium 8.1 (L) 8.9 - 10.3 mg/dL   Total Protein 7.4 6.5 - 8.1 g/dL   Albumin 2.6 (L) 3.5 - 5.0 g/dL   AST 21 15 - 41 U/L   ALT 21 0 - 44 U/L   Alkaline Phosphatase 92 38 - 126 U/L   Total Bilirubin 1.2 0.0 - 1.2 mg/dL   GFR, Estimated >39 >39 mL/min    Comment: (NOTE) Calculated using the CKD-EPI Creatinine Equation (2021)    Anion gap 10 5 - 15    Comment: Performed at [facility], [address], [place], [place] [address]  Magnesium     Status: None   Collection Time: 09/05/23  3:30 AM  Result Value Ref Range   Magnesium 2.0 1.7 - 2.4 mg/dL    Comment: Performed at [facility], [address], [place], [place] [address]  Hemoglobin A1c     Status: Abnormal   Collection Time: 09/05/23  3:30 AM  Result Value Ref Range   Hgb A1c MFr Bld 6.3 (H) 4.8 - 5.6 %    Comment: (NOTE) Diagnosis of Diabetes The following HbA1c ranges recommended by the American Diabetes Association (ADA) may be used as an aid in the diagnosis of diabetes mellitus.  Hemoglobin             Suggested A1C NGSP%              Diagnosis  <5.7                   Non  Diabetic  5.7-6.4                Pre-Diabetic  >6.4                   Diabetic  <7.0                   Glycemic control for                       adults with diabetes.     Mean Plasma Glucose 134.11 mg/dL    Comment: Performed at [facility], [address], [place], [place] [address]  Glucose, capillary     Status: Abnormal   Collection Time: 09/05/23 11:52 AM  Result Value Ref Range   Glucose-Capillary 139 (H) 70 - 99 mg/dL    Comment: Glucose reference range applies  only to samples taken after fasting for at least 8 hours.  Glucose, capillary     Status: Abnormal   Collection Time: 09/05/23  4:25 PM  Result Value Ref Range   Glucose-Capillary 120 (H) 70 - 99 mg/dL    Comment: Glucose reference range applies only to samples taken after fasting for at least 8 hours.     Review of Systems  Constitutional:  Positive for diaphoresis and fatigue. Negative for chills and fever.  Respiratory:  Negative for cough and shortness of breath.   Cardiovascular:  Negative for chest pain.  Gastrointestinal:  Negative for abdominal pain, nausea and vomiting.  Musculoskeletal:  Positive for arthralgias and joint swelling.  Skin:  Positive for wound.  Neurological:  Negative for dizziness.   Blood pressure 128/71, pulse 97, temperature 98.7 F (37.1 C), resp. rate 17, height 6' 2 (1.88 m), weight (!) 227.7 kg, SpO2 94%. Physical Exam Constitutional:      General: He is not in acute distress.    Appearance: Normal appearance. He is obese. He is not ill-appearing.  HENT:     Head: Normocephalic and atraumatic.   Eyes:     Extraocular Movements: Extraocular movements intact.    Cardiovascular:     Rate and Rhythm: Normal rate.     Pulses: Normal pulses.  Pulmonary:     Effort: Pulmonary effort is normal. No respiratory distress.   Musculoskeletal:     Comments: Erythema due to cellulitis on RLE. Lymphedema RLE. Chronic ulcer on right LE as noted by pictures from hospitalist  note. Patient able to dorsiflex and plantar flex the right foot/ankle. Sensation to light touch intact right foot and calf.       Skin:    Capillary Refill: Capillary refill takes 2 to 3 seconds.   Neurological:     General: No focal deficit present.     Mental Status: He is alert. Mental status is at baseline.   Psychiatric:        Mood and Affect: Mood normal.     Assessment/Plan: Right leg chronic stasis ulcer Dr Dozier evaluated the patient and feels that the patient does not need further imaging at this time. The patient does not need wound debridement at this time as the wound itself appears to be stable but with more acute cellulitis. Will see how patient responds to antibiotics. Patient is not amenable to amputation which would be orthopedic surgical intervention if any. If wound become more stable, plastics may be consulted for involvement. MRI's cancelled for now. Ortho will sign off at this time. Please contact us  if further involvement needed.   Osama Coleson L. [place], PA-C 09/05/2023, 4:33 PM

## 2023-09-05 NOTE — Plan of Care (Signed)

## 2023-09-05 NOTE — H&P (Addendum)
 History and Physical    Patient: [name] FMW:[id number] DOB: 1980-12-05 DOA: 09/04/2023 DOS: the patient was seen and examined on 09/05/2023 PCP: Tanda Bleacher, MD  Patient coming from: Home  Chief Complaint:  Chief Complaint  Patient presents with   Leg Swelling   HPI: [name] is a 43 y.o. male with medical history significant of OSA/OHS on CPAP, polycythemia followed by hematology in the outpatient setting, history of prior DVT, iron deficiency anemia obesity with a BMI greater than 64, diabetes mellitus on oral agents, history of NSVT, and chronic pain secondary to longstanding stasis ulcer wound in the right lower extremity as well as diabetic neuropathy.  Per outpatient vascular note patient is also undergone multiple greater saphenous vein procedures in the past.  Presented to the ED with worsening right lower edema over the past 24 hours.  Has chronic lymphedema.  Reported that the right leg was red and warm to the touch.  On exam this area was reddened up to the groin.  Chronic wound is followed by the wound care center in the outpatient setting.  He reported diaphoresis and feeling fatigued throughout the past 7 days.  No shortness of breath or fevers.  In the ED he was hemodynamically stable and showed no signs of sepsis.  White count was 15,700 with left shift.  Lactic acid 1.3.  Sodium 133 with a glucose of 134 and normal renal function.  Wound itself looks clean with excessive amounts of fibrin and no purulent drainage.  There is serous fluid weeping from the wound itself.  Hospitalist service asked to evaluate the patient for admission.   Review of Systems: As mentioned in the history of present illness. All other systems reviewed and are negative.  Past Medical History:  Diagnosis Date   Chronic venous insufficiency    a. as a premature infant, required venous access for care with subsequent DVT and some sort of procedure -> eventually went on to have vein bypass  around 2005.   Hypertension    Lymphedema    Morbid obesity (HCC)    OSA on CPAP    Past Surgical History:  Procedure Laterality Date   balloon stenting Right    right leg   VEIN LIGATION AND STRIPPING     Social History:  reports that he has never smoked. He has never used smokeless tobacco. He reports current alcohol use of about 4.0 standard drinks of alcohol per week. He reports that he does not use drugs.  Allergies  Allergen Reactions   Entresto  [Sacubitril -Valsartan ] Swelling and Other (See Comments)    Tongue swelling   Lidocaine  Hives, Shortness Of Breath and Other (See Comments)    burns my skin also- Patient ended up in the ED   Other Anaphylaxis, Swelling and Other (See Comments)    Tree nuts cause swelling   Peanut-Containing Drug Products Anaphylaxis and Swelling   Vancomycin  Anaphylaxis    From careeverywhere records ([facility])   Hydrofera Blue 4X4 [Wound Dressings] Nausea Only and Other (See Comments)    Made the patient light-headed and caused sweating also   Latex Hives and Other (See Comments)    Reaction to latex gloves    Family History  Problem Relation Age of Onset   Diabetes Mother    Cancer Father        colon   Cancer Maternal Grandmother    Cancer Maternal Grandfather     Prior to Admission medications   Medication Sig Start Date  End Date Taking? Authorizing Provider  Accu-Chek Softclix Lancets lancets Use as instructed 04/15/22   Tanda Bleacher, MD  Blood Glucose Monitoring Suppl (ACCU-CHEK GUIDE ME) w/Device KIT USE UP TO 4 TIMES A DAY AS DIRECTED 03/31/22   Tanda Bleacher, MD  carvedilol  (COREG ) 25 MG tablet TAKE 1 TABLET BY MOUTH TWICE A DAY 04/18/23   Tanda Bleacher, MD  dapagliflozin  propanediol (FARXIGA ) 10 MG TABS tablet Take 1 tablet (10 mg total) by mouth daily before breakfast. 04/11/23   Walker, Caitlin S, NP  dapagliflozin  propanediol (FARXIGA ) 10 MG TABS tablet Take 1 tablet (10 mg total) by mouth daily before breakfast. 04/11/23    Walker, Caitlin S, NP  EPINEPHrine  0.3 mg/0.3 mL IJ SOAJ injection Inject 0.3 mg into the muscle as needed for anaphylaxis. 09/21/21   Levander Houston, MD  furosemide  (LASIX ) 80 MG tablet Take 1 tablet (80 mg total) by mouth 2 (two) times daily. 04/11/23 04/05/24  Vannie Reche RAMAN, NP  gabapentin  (NEURONTIN ) 300 MG capsule Take 1 capsule (300 mg total) by mouth 3 (three) times daily. 06/18/23   Danton Jon HERO, PA-C  glucose blood (ACCU-CHEK GUIDE) test strip Use as instructed 04/15/22   Tanda Bleacher, MD  hydrALAZINE  (APRESOLINE ) 50 MG tablet TAKE 1 TABLET (50 MG) BY MOUTH IN THE MORNING AND AT BEDTIME 05/27/23   Walker, Caitlin S, NP  metFORMIN  (GLUCOPHAGE ) 500 MG tablet Take 1 tablet (500 mg total) by mouth daily with breakfast. 06/18/23   Danton Jon HERO, PA-C  Semaglutide , 2 MG/DOSE, 8 MG/3ML SOPN Inject 2 mg as directed once a week. 07/18/23   Danton Jon HERO, PA-C  spironolactone  (ALDACTONE ) 25 MG tablet Take 1 tablet (25 mg total) by mouth daily. 04/11/23   Vannie Reche RAMAN, NP  traMADol  (ULTRAM ) 50 MG tablet Take 1 tablet (50 mg total) by mouth every 6 (six) hours as needed for moderate pain (pain score 4-6). 06/18/23   Danton Jon HERO, PA-C    Physical Exam: Vitals:   09/04/23 2215 09/04/23 2321 09/05/23 0001 09/05/23 0535  BP:  136/81 (!) 148/87 (!) 141/80  Pulse:  100 100 97  Resp:  20 18 18   Temp: 98 F (36.7 C)  98.5 F (36.9 C) 99 F (37.2 C)  TempSrc:   Oral Oral  SpO2:  99% 100% 94%  Weight:   (!) 227.7 kg   Height:   6' 2 (1.88 m)    Constitutional: NAD, calm, mildly uncomfortable secondary to right lower extremity pain Respiratory: clear to auscultation bilaterally, no wheezing, no crackles. Normal respiratory effort. No accessory muscle use.  Room air Cardiovascular: Regular rate and rhythm, no murmurs / rubs / gallops.  Marked bilateral lower extremity edema right greater than left.. Pedal pulses are palpable.  Abdomen: no tenderness, no masses palpated. No  hepatosplenomegaly. Bowel sounds positive.  Musculoskeletal: no clubbing / cyanosis. No joint deformity upper and lower extremities, no contractures. Normal muscle tone.  Skin: Chronic edema of the legs with expected skin changes.  See pictures below regarding right lower extremity wound   Neurologic: CN 2-12 grossly intact. Sensation intact, Strength 5/5 except for right lower extremity which is weaker in context of pain and edema. Psychiatric: Normal judgment and insight. Alert and oriented x 3. Normal mood.    Data Reviewed:  Sodium 133, potassium 3.5, chloride 97, CO2 26, glucose 134, BUN 12, creatinine 0.97, calcium 8.1, anion gap 10, albumin 2.6  WBC 15,700, hemoglobin 10.6, MCV 80.7, platelets 185,000  Assessment and Plan: Chronic  venous stasis wound right lower extremity with associated cellulitic change Chronic lymphedema As an outpatient was followed by Center with intermittent outpatient curettage as well as wound care Cellulitic changes involving the entire right lower extremity with associated significant edema Will consult orthopedic service-await wound care recommendations from orthopedic team **discussed with orthopedic team and they recommend MRI of right lower extremity to rule out any underlying osteomyelitis Will utilize IV Rocephin  and Zyvox  with notable history of MSSA Treat underlying pain as below Continue home doses of Lasix  and Aldactone  Discussed with Dr Pearline with [facility]- rec RLE venous duplex to r/o DVT  Diabetes mellitus 2 On oral agent at home Obtain hemoglobin A1c Follow [facility] and provide SSI  Morbid obesity with a BMI 64 OSA/OHS on CPAP CPAP protocol  Polycythemia with history of remote DVT Initiate pharmacological DVT prophylaxis with Lovenox  Per last hematology note on 08/19/2018 clinical picture consistent with secondary polycythemia related to sleep apnea and obesity hypoventilation syndrome-plan is to maintain hematocrit below 55 with intermittent  therapeutic phlebotomy  Chronic pain in context of chronic wound as well as diabetic peripheral neuropathy On tramadol  and gabapentin  at home Statin but will hold tramadol  in favor of Oxy IR for acute pain relation to cellulitis and worsening right lower extremity ulcer    Advance Care Planning:   Code Status: Full Code   VTE prophylaxis: Lovenox   Consults: Orthopedics ans Vascular  Family Communication: Patient only  Severity of Illness: The appropriate patient status for this patient is INPATIENT. Inpatient status is judged to be reasonable and necessary in order to provide the required intensity of service to ensure the patient's safety. The patient's presenting symptoms, physical exam findings, and initial radiographic and laboratory data in the context of their chronic comorbidities is felt to place them at high risk for further clinical deterioration. Furthermore, it is not anticipated that the patient will be medically stable for discharge from the hospital within 2 midnights of admission.   * I certify that at the point of admission it is my clinical judgment that the patient will require inpatient hospital care spanning beyond 2 midnights from the point of admission due to high intensity of service, high risk for further deterioration and high frequency of surveillance required.*  Author: Isaiah Lever, NP 09/05/2023 8:22 AM  For on call review [email or web address].

## 2023-09-05 NOTE — Plan of Care (Signed)
  Problem: Education: Goal: Knowledge of General Education information will improve Description: Including pain rating scale, medication(s)/side effects and non-pharmacologic comfort measures Outcome: Progressing   Problem: Clinical Measurements: Goal: Ability to maintain clinical measurements within normal limits will improve Outcome: Progressing Goal: Diagnostic test results will improve Outcome: Progressing Goal: Cardiovascular complication will be avoided Outcome: Progressing   Problem: Health Behavior/Discharge Planning: Goal: Ability to manage health-related needs will improve Outcome: Not Progressing   Problem: Clinical Measurements: Goal: Will remain free from infection Outcome: Not Progressing Goal: Respiratory complications will improve Outcome: Not Progressing

## 2023-09-05 NOTE — Progress Notes (Signed)
(  Carryover admission to the Day Admitter; accepted by Dr.  Shona as transfer from  [facility]  to a  med-surg bed at  [facility]  for right lower extremity cellulitis. Please see Dr.  Milford transfer documentation in [facility] Communication for additional details).   I have placed some additional preliminary admit orders via the adult multi-morbid admission order set. I have also ordered continuation of the Rocephin  that he received [facility].  It appears that he also received a dose of linezolid  context of a reported history of anaphylactic response to IV vancomycin .  I will defer additional antibiotic selection to the admitting hospitalist.  Also ordered prn IV Dilaudid, prn IV Zofran , as well as morning labs to include CMP, CBC, magnesium.    Eva Pore, DO Hospitalist

## 2023-09-05 NOTE — Consult Note (Signed)
 Spoke with medicine regarding the patient. Reviewed history. Will await findings of advanced imaging to determine next best steps, which may include ongoing antibioitc treatment, vascular consult, or possible eventual amputation.  [employer], PA-C

## 2023-09-05 NOTE — Consult Note (Signed)
 Vascular Surgery plan of care  Patient is a 43 year old male with known lymphedema and chronic venous insufficiency who has had bilateral GSV ablations in the past and has had healed ulcers.  He is now admitted was along with worsening ulceration of the right ankle and significant worsening of his right lower extremity swelling.  He was last seen in clinic in March and was in the process of obtaining new lymphedema pumps and we recommended medical grade compression stockings and intermittent elevation.  Given his new wound he will need ongoing wound care and compression therapy Please obtain a venous duplex to rule out concomitant DVT.  Norman GORMAN Serve MD [facility] Phone Number: [phone] 09/05/2023 2:48 PM

## 2023-09-06 ENCOUNTER — Inpatient Hospital Stay (HOSPITAL_COMMUNITY)

## 2023-09-06 DIAGNOSIS — L03115 Cellulitis of right lower limb: Secondary | ICD-10-CM | POA: Diagnosis not present

## 2023-09-06 DIAGNOSIS — M7989 Other specified soft tissue disorders: Secondary | ICD-10-CM | POA: Diagnosis not present

## 2023-09-06 LAB — GLUCOSE, CAPILLARY
Glucose-Capillary: 118 mg/dL — ABNORMAL HIGH (ref 70–99)
Glucose-Capillary: 120 mg/dL — ABNORMAL HIGH (ref 70–99)
Glucose-Capillary: 134 mg/dL — ABNORMAL HIGH (ref 70–99)
Glucose-Capillary: 125 mg/dL — ABNORMAL HIGH (ref 70–99)

## 2023-09-06 MED ORDER — GABAPENTIN 300 MG PO CAPS
300.0000 mg | ORAL_CAPSULE | Freq: Three times a day (TID) | ORAL | Status: DC
  Administered 2023-09-06 – 2023-09-11 (×16): 300 mg via ORAL
  Filled 2023-09-06 (×16): qty 1

## 2023-09-06 NOTE — Plan of Care (Signed)
  Problem: Education: Goal: Knowledge of General Education information will improve Description: Including pain rating scale, medication(s)/side effects and non-pharmacologic comfort measures Outcome: Progressing   Problem: Clinical Measurements: Goal: Ability to maintain clinical measurements within normal limits will improve Outcome: Progressing Goal: Will remain free from infection Outcome: Progressing Goal: Diagnostic test results will improve Outcome: Progressing   Problem: Health Behavior/Discharge Planning: Goal: Ability to manage health-related needs will improve Outcome: Not Progressing

## 2023-09-06 NOTE — Progress Notes (Signed)
 PROGRESS NOTE  [name]  DOB: May 11, 1980  PCP: Tanda Bleacher, MD FMW:[id number]  DOA: 09/04/2023  LOS: 2 days  Hospital Day: 3  Brief narrative: [name] is a 43 y.o. male with PMH significant for morbid obesity, chronic venous insufficiency, chronic lower extremity lymphedema R>L with stasis ulcers, h/o DVT previously on Coumadin, OSA on CPAP, CHF, anemia, chronic pain,diabetic neuropathy Patient follows up with wound care center for lower extremities wound. Per outpatient vascular note patient had also undergone multiple greater saphenous vein procedures in the past.    6/26, patient presented to the ED with complaint of rapidly worsening swelling, redness, pain of right lower extremity.  Also the pre-existing right leg ulcer opened up.  In the ED, patient was afebrile, hemodynamically stable, WBC count 14.9, hemoglobin 11.3, platelet 198, sodium 134, potassium 3.2, lactic acid 1.3 Right leg wound had granulation tissue, no purulent discharge Admitted to [facility] Orthopedic and vascular surgery consulted  Subjective: Patient was seen and examined this morning. Pleasant middle-aged African-American male with morbid obesity. Swelling, warmth present up to right thigh Wife at bedside Labs from this morning with WC count 15.7, A1c 6.3, sodium 133  Assessment and plan: Acute cellulitis of right lower extremity Bilateral lower extremity lymphedema R>L Right leg venous stasis ulcers Patient has chronic bilateral lower EXTR lymphedema related to his age, multiple peripheral vascular interventions and also history of right leg DVT several years ago, previously on Coumadin.   He follows up with vascular surgery care as an outpatient. Presented with rapidly worsening swelling, redness, warmth of right lower extremity That was found elevated.  Lactic acid level normal. Started on IV antibiotics By orthopedics and vascular surgery. Initially plan for MRI lower extremity but on  evaluation by orthopedic surgery, imaging was not thought to be necessary and hence discontinued. Venous duplex pending Also to follow-up with vascular surgery as outpatient for lymphedema pump, medical grade compression stockings. Wound care to follow-up Continues to have significant swelling involving the right lower extremity.  Needs to continue IV antibiotics for now Recent Labs  Lab 09/04/23 1848 09/04/23 2055 09/05/23 0330  WBC 14.9*  --  15.7*  LATICACIDVEN  --  1.3  --    H/o CHF, HTN Per last cardiology note, echocardiogram could not assess LV function because of morbid obesity.  PTA meds- Coreg  25 mg twice daily, Lasix  80 mg twice daily, Aldactone  25 mg daily, Farxiga  10 mg daily hydralazine  Currently continued on home doses of Coreg , Lasix , Aldactone  Blood pressure stable.  Continue to monitor IV hydralazine  as needed.  Type 2 diabetes mellitus A1c 6.3 on 09/05/2023 PTA meds-Farxiga  Currently on SSI/Accu-Cheks Recent Labs  Lab 09/05/23 1152 09/05/23 1625 09/05/23 2101 09/06/23 0811 09/06/23 1159  GLUCAP 139* 120* 106* 118* 120*   Morbid Obesity  Body mass index is 64.45 kg/m. Patient has been advised to make an attempt to improve diet and exercise patterns to aid in weight loss. Patient states he is in the process of starting Ozempic   OSA/OHS On nocturnal CPAP  H/o remote DVT Previously on Coumadin.  Currently not on anticoagulation  H/o polycythemia  Chronic anemia He was seen by Dr. Autumn recently as an outpatient.  Patient previously had polycythemia thought to be a physiological response to hypoxia d/t OSA.  Noted a plan is to maintain hematocrit below 55 with intermittent therapeutic phlebotomy.  Lately he has anemia.  No active bleeding.  Continue to monitor Hemoglobin trend as below.  Remains above 10. Recent  Labs    03/24/23 1509 04/21/23 1449 08/19/23 1443 09/04/23 1848 09/05/23 0330  HGB 12.4* 12.3* 12.5* 11.3* 10.6*  MCV 78.6* 79.5* 80.0  79.0* 80.7  FERRITIN 83 105 211  --   --   TIBC 350 328 294  --   --   IRON 40* 32* 36*  --   --     Chronic pain diabetic peripheral neuropathy Currently on Oxy, gabapentin    Mobility: Encourage ambulation  Goals of care   Code Status: Full Code     DVT prophylaxis:  SCDs Start: 09/05/23 0035   Antimicrobials: IV Rocephin , IV Zyvox  Fluid: None Consultants: Orthopedics, vascular surgery Family Communication: Wife at bedside  Status: Inpatient Level of care:  Med-Surg   Patient is from: Home Needs to continue in-hospital care: Needs IV antibiotics and further monitoring Anticipated d/c to: Hopefully home in 2 to 3 days      Diet:  Diet Order             Diet Carb Modified Fluid consistency: Thin; Room service appropriate? Yes  Diet effective now                   Scheduled Meds:  carvedilol   25 mg Oral BID   dapagliflozin  propanediol  10 mg Oral QAC breakfast   enoxaparin  (LOVENOX ) injection  100 mg Subcutaneous Q24H   furosemide   80 mg Oral BID   gabapentin   300 mg Oral TID   insulin aspart  0-15 Units Subcutaneous TID WC   insulin aspart  0-5 Units Subcutaneous QHS   sodium chloride  flush  3 mL Intravenous Q12H   spironolactone   25 mg Oral Daily    PRN meds: acetaminophen  **OR** acetaminophen , HYDROmorphone (DILAUDID) injection, melatonin, naLOXone (NARCAN)  injection, ondansetron  (ZOFRAN ) IV, oxyCODONE   Infusions:   cefTRIAXone  (ROCEPHIN )  IV 1 g (09/06/23 1033)   linezolid  (ZYVOX ) IV 600 mg (09/06/23 0818)    Antimicrobials: Anti-infectives (From admission, onward)    Start     Dose/Rate Route Frequency Ordered Stop   09/05/23 1100  cefTRIAXone  (ROCEPHIN ) 1 g in sodium chloride  0.9 % 100 mL IVPB        1 g 200 mL/hr over 30 Minutes Intravenous Every 24 hours 09/05/23 0041     09/05/23 1000  linezolid  (ZYVOX ) IVPB 600 mg        600 mg 300 mL/hr over 60 Minutes Intravenous Every 12 hours 09/05/23 0829     09/04/23 2215  vancomycin   (VANCOCIN ) IVPB 1000 mg/200 mL premix  Status:  Discontinued       Placed in Followed by Linked Group   1,000 mg 200 mL/hr over 60 Minutes Intravenous  Once 09/04/23 2113 09/04/23 2114   09/04/23 2130  linezolid  (ZYVOX ) IVPB 600 mg        600 mg 300 mL/hr over 60 Minutes Intravenous  Once 09/04/23 2123 09/05/23 1118   09/04/23 2115  vancomycin  (VANCOCIN ) IVPB 1000 mg/200 mL premix  Status:  Discontinued       Placed in Followed by Linked Group   1,000 mg 200 mL/hr over 60 Minutes Intravenous  Once 09/04/23 2113 09/04/23 2114   09/04/23 2100  vancomycin  (VANCOCIN ) IVPB 1000 mg/200 mL premix  Status:  Discontinued        1,000 mg 200 mL/hr over 60 Minutes Intravenous  Once 09/04/23 2057 09/04/23 2113   09/04/23 2100  cefTRIAXone  (ROCEPHIN ) 2 g in sodium chloride  0.9 % 100 mL IVPB  2 g 200 mL/hr over 30 Minutes Intravenous  Once 09/04/23 2057 09/04/23 2209       Objective: Vitals:   09/06/23 0505 09/06/23 1344  BP: 138/73 129/83  Pulse: 92 89  Resp: 18 18  Temp: 99 F (37.2 C) 97.7 F (36.5 C)  SpO2: 90% 99%    Intake/Output Summary (Last 24 hours) at 09/06/2023 1443 Last data filed at 09/06/2023 1430 Gross per 24 hour  Intake 820 ml  Output 2600 ml  Net -1780 ml   Filed Weights   09/04/23 1810 09/05/23 0001  Weight: (!) 230.2 kg (!) 227.7 kg   Weight change:  Body mass index is 64.45 kg/m.   Physical Exam: General exam: Pleasant, morbidly obese middle-aged African-American male Skin: No rashes, lesions or ulcers. HEENT: Atraumatic, normocephalic, no obvious bleeding Lungs: Clear to auscultation bilaterally,  CVS: S1, S2, no murmur,   GI/Abd: Soft, nontender, distended from obesity, bowel sound present,   CNS: Alert, awake, oriented x 3 Psychiatry: Mood appropriate Extremities: Has bilateral lower EXTR lymphedema R>L, right lower extremity swollen up to thigh, with redness and warmth.    Data Review: I have personally reviewed the laboratory data and  studies available.  F/u labs ordered Unresulted Labs (From admission, onward)     Start     Ordered   09/12/23 0500  Creatinine, serum  (enoxaparin  (LOVENOX )    CrCl >/= 30 ml/min)  Weekly,   R     Comments: while on enoxaparin  therapy    09/05/23 1201            Signed, Chapman Rota, MD [facility] 09/06/2023

## 2023-09-06 NOTE — Plan of Care (Signed)
  Problem: Education: Goal: Knowledge of General Education information will improve Description: Including pain rating scale, medication(s)/side effects and non-pharmacologic comfort measures Outcome: Progressing   Problem: Health Behavior/Discharge Planning: Goal: Ability to manage health-related needs will improve Outcome: Progressing   Problem: Clinical Measurements: Goal: Ability to maintain clinical measurements within normal limits will improve Outcome: Progressing Goal: Will remain free from infection Outcome: Progressing Goal: Diagnostic test results will improve Outcome: Progressing Goal: Respiratory complications will improve Outcome: Progressing Goal: Cardiovascular complication will be avoided Outcome: Progressing   Problem: Activity: Goal: Risk for activity intolerance will decrease Outcome: Progressing   Problem: Coping: Goal: Level of anxiety will decrease Outcome: Progressing   Problem: Elimination: Goal: Will not experience complications related to bowel motility Outcome: Progressing   Problem: Pain Managment: Goal: General experience of comfort will improve and/or be controlled Outcome: Progressing   Problem: Safety: Goal: Ability to remain free from injury will improve Outcome: Progressing   Problem: Skin Integrity: Goal: Risk for impaired skin integrity will decrease Outcome: Progressing   Problem: Education: Goal: Ability to describe self-care measures that may prevent or decrease complications (Diabetes Survival Skills Education) will improve Outcome: Progressing Goal: Individualized Educational Video(s) Outcome: Progressing   Problem: Coping: Goal: Ability to adjust to condition or change in health will improve Outcome: Progressing   Problem: Fluid Volume: Goal: Ability to maintain a balanced intake and output will improve Outcome: Progressing   Problem: Health Behavior/Discharge Planning: Goal: Ability to identify and utilize  available resources and services will improve Outcome: Progressing Goal: Ability to manage health-related needs will improve Outcome: Progressing   Problem: Metabolic: Goal: Ability to maintain appropriate glucose levels will improve Outcome: Progressing   Problem: Nutritional: Goal: Maintenance of adequate nutrition will improve Outcome: Progressing Goal: Progress toward achieving an optimal weight will improve Outcome: Progressing   Problem: Skin Integrity: Goal: Risk for impaired skin integrity will decrease Outcome: Progressing   Problem: Tissue Perfusion: Goal: Adequacy of tissue perfusion will improve Outcome: Progressing

## 2023-09-06 NOTE — Consult Note (Addendum)
 WOC Nurse Consult Note: patient with lymphedema and chronic R lower extremity wound followed at [facility]; last seen [facility] 08/15/2023 with orders for Aquacel; patient has been evaluated by orthopedics while inpatient and deemed not to require surgical debridement at this time  Reason for Consult: R lower extremity wound  Wound type: full thickness ulceration r/t venous and lymphatic insufficiency  Pressure Injury POA: NA  Measurement: see nursing flowsheet  Wound azi:ojmhzob red moist, some tan fibrinous tissue noted  Drainage (amount, consistency, odor) per nursing flowsheet  Periwound: edema, erythema (being treated for R lower leg cellulitis)  Dressing procedure/placement/frequency: Cleanse R lower leg wound with Vashe wound cleanser Soila [phone]) do not rinse and allow to air dry.  Apply silver hydrofiber ([place] [phone]) to wound bed every other day, cover with ABD pad and secure with Kerlix roll gauze beginning right above toes and ending right below knee.  Cover with Ace bandage wrapped in same fashion as Kerlix for light compression.    POC discussed with bedside nurse. WOC team will not follow. Patient should continue follow-up with wound care center as outpatient for ongoing management of lymphedema and wound.    Thank you,    Powell Bar MSN, RN-BC, [employer]

## 2023-09-06 NOTE — Plan of Care (Signed)
  Problem: Education: Goal: Knowledge of General Education information will improve Description: Including pain rating scale, medication(s)/side effects and non-pharmacologic comfort measures Outcome: Progressing   Problem: Health Behavior/Discharge Planning: Goal: Ability to manage health-related needs will improve Outcome: Progressing   Problem: Clinical Measurements: Goal: Ability to maintain clinical measurements within normal limits will improve Outcome: Progressing Goal: Will remain free from infection Outcome: Progressing Goal: Diagnostic test results will improve Outcome: Progressing Goal: Respiratory complications will improve Outcome: Progressing Goal: Cardiovascular complication will be avoided Outcome: Progressing   Problem: Activity: Goal: Risk for activity intolerance will decrease Outcome: Progressing   Problem: Nutrition: Goal: Adequate nutrition will be maintained Outcome: Completed/Met   Problem: Coping: Goal: Level of anxiety will decrease Outcome: Progressing   Problem: Elimination: Goal: Will not experience complications related to bowel motility Outcome: Progressing Goal: Will not experience complications related to urinary retention Outcome: Completed/Met   Problem: Pain Managment: Goal: General experience of comfort will improve and/or be controlled Outcome: Progressing   Problem: Safety: Goal: Ability to remain free from injury will improve Outcome: Progressing   Problem: Skin Integrity: Goal: Risk for impaired skin integrity will decrease Outcome: Progressing   Problem: Nutritional: Goal: Maintenance of adequate nutrition will improve Outcome: Adequate for Discharge   Problem: Skin Integrity: Goal: Risk for impaired skin integrity will decrease Outcome: Progressing   Problem: Tissue Perfusion: Goal: Adequacy of tissue perfusion will improve Outcome: Progressing

## 2023-09-06 NOTE — Progress Notes (Signed)
 VASCULAR LAB    Right lower extremity venous duplex has been performed.  See CV proc for preliminary results.   Lacye Mccarn, RVT 09/06/2023, 2:50 PM

## 2023-09-07 DIAGNOSIS — L03115 Cellulitis of right lower limb: Secondary | ICD-10-CM | POA: Diagnosis not present

## 2023-09-07 LAB — GLUCOSE, CAPILLARY
Glucose-Capillary: 186 mg/dL — ABNORMAL HIGH (ref 70–99)
Glucose-Capillary: 126 mg/dL — ABNORMAL HIGH (ref 70–99)
Glucose-Capillary: 113 mg/dL — ABNORMAL HIGH (ref 70–99)
Glucose-Capillary: 148 mg/dL — ABNORMAL HIGH (ref 70–99)

## 2023-09-07 MED ORDER — ENOXAPARIN SODIUM 120 MG/0.8ML IJ SOSY
110.0000 mg | PREFILLED_SYRINGE | INTRAMUSCULAR | Status: DC
  Administered 2023-09-07 – 2023-09-10 (×4): 110 mg via SUBCUTANEOUS
  Filled 2023-09-07 (×5): qty 0.74

## 2023-09-07 NOTE — Progress Notes (Signed)
 PHARMACY - ANTICOAGULATION CONSULT NOTE  Pharmacy Consult for Lovenox  Indication: VTE prophylaxis  Allergies  Allergen Reactions   Entresto  [Sacubitril -Valsartan ] Swelling and Other (See Comments)    Tongue swelling   Lidocaine  Hives, Shortness Of Breath and Other (See Comments)    burns my skin also- Patient ended up in the ED   Other Anaphylaxis, Swelling and Other (See Comments)    Tree nuts cause swelling   Peanut-Containing Drug Products Anaphylaxis and Swelling   Vancomycin  Anaphylaxis    From careeverywhere records ([facility])   Hydrofera Blue 4X4 [Wound Dressings] Nausea Only and Other (See Comments)    Made the patient light-headed and caused sweating also   Latex Hives and Other (See Comments)    Patient Measurements: Height: 6' 2 (188 cm) Weight: (!) 227.7 kg (502 lb) IBW/kg (Calculated) : 82.2 HEPARIN  DW (KG): 140.2 BMI = 64.45  Vital Signs: Temp: 98.1 F (36.7 C) (06/29 0634) Temp Source: Oral (06/29 0634) BP: 140/75 (06/29 0908) Pulse Rate: 88 (06/29 0908)  Labs: Recent Labs    09/04/23 1848 09/05/23 0330  HGB 11.3* 10.6*  HCT 34.9* 34.7*  PLT 198 185  CREATININE 1.08 0.97    Estimated Creatinine Clearance: 195 mL/min (by C-G formula based on SCr of 0.97 mg/dL).   Medical History: Past Medical History:  Diagnosis Date   Chronic venous insufficiency    a. as a premature infant, required venous access for care with subsequent DVT and some sort of procedure -> eventually went on to have vein bypass around 2005.   Hypertension    Lymphedema    Morbid obesity (HCC)    OSA on CPAP      Assessment: As BMI > 30, will adjust Lovenox  dose to 0.5 mg/kg/q24h for VTE prophylaxis  Plan:  Lovenox  110mg  sq q24h for VTE prophylaxis  Will sign off at this time.  Please reconsult if a change in clinical status warrants re-evaluation of dosage.   Calvin Jablonowski, Arvin Fletcher, PharmD 09/07/2023,10:44 AM

## 2023-09-07 NOTE — Plan of Care (Signed)
  Problem: Coping: Goal: Level of anxiety will decrease Outcome: Progressing   Problem: Pain Managment: Goal: General experience of comfort will improve and/or be controlled Outcome: Progressing   Problem: Safety: Goal: Ability to remain free from injury will improve Outcome: Progressing   Problem: Skin Integrity: Goal: Risk for impaired skin integrity will decrease Outcome: Progressing   Problem: Skin Integrity: Goal: Risk for impaired skin integrity will decrease Outcome: Progressing

## 2023-09-07 NOTE — Plan of Care (Signed)
  Problem: Education: Goal: Knowledge of General Education information will improve Description: Including pain rating scale, medication(s)/side effects and non-pharmacologic comfort measures Outcome: Progressing   Problem: Health Behavior/Discharge Planning: Goal: Ability to manage health-related needs will improve Outcome: Progressing   Problem: Clinical Measurements: Goal: Ability to maintain clinical measurements within normal limits will improve Outcome: Progressing Goal: Will remain free from infection Outcome: Progressing Goal: Diagnostic test results will improve Outcome: Progressing Goal: Respiratory complications will improve Outcome: Progressing Goal: Cardiovascular complication will be avoided Outcome: Progressing   Problem: Activity: Goal: Risk for activity intolerance will decrease Outcome: Progressing   Problem: Coping: Goal: Level of anxiety will decrease Outcome: Progressing   Problem: Elimination: Goal: Will not experience complications related to bowel motility Outcome: Progressing   Problem: Pain Managment: Goal: General experience of comfort will improve and/or be controlled Outcome: Progressing   Problem: Safety: Goal: Ability to remain free from injury will improve Outcome: Progressing   Problem: Skin Integrity: Goal: Risk for impaired skin integrity will decrease Outcome: Progressing   Problem: Education: Goal: Ability to describe self-care measures that may prevent or decrease complications (Diabetes Survival Skills Education) will improve Outcome: Progressing Goal: Individualized Educational Video(s) Outcome: Progressing   Problem: Coping: Goal: Ability to adjust to condition or change in health will improve Outcome: Progressing   Problem: Fluid Volume: Goal: Ability to maintain a balanced intake and output will improve Outcome: Progressing   Problem: Health Behavior/Discharge Planning: Goal: Ability to identify and utilize  available resources and services will improve Outcome: Progressing Goal: Ability to manage health-related needs will improve Outcome: Progressing   Problem: Metabolic: Goal: Ability to maintain appropriate glucose levels will improve Outcome: Progressing   Problem: Nutritional: Goal: Maintenance of adequate nutrition will improve Outcome: Progressing Goal: Progress toward achieving an optimal weight will improve Outcome: Progressing   Problem: Skin Integrity: Goal: Risk for impaired skin integrity will decrease Outcome: Progressing   Problem: Tissue Perfusion: Goal: Adequacy of tissue perfusion will improve Outcome: Progressing

## 2023-09-07 NOTE — Progress Notes (Signed)
 PROGRESS NOTE  [name]  DOB: 1981/02/27  PCP: Tanda Bleacher, MD FMW:[id number]  DOA: 09/04/2023  LOS: 3 days  Hospital Day: 4  Brief narrative: [name] is a 43 y.o. male with PMH significant for morbid obesity, chronic venous insufficiency, chronic lower extremity lymphedema R>L with stasis ulcers, h/o DVT previously on Coumadin, OSA on CPAP, CHF, anemia, chronic pain,diabetic neuropathy Patient follows up with wound care center for lower extremities wound. Per outpatient vascular note patient had also undergone multiple greater saphenous vein procedures in the past.    6/26, patient presented to the ED with complaint of rapidly worsening swelling, redness, pain of right lower extremity.  Also the pre-existing right leg ulcer opened up.  In the ED, patient was afebrile, hemodynamically stable, WBC count 14.9, hemoglobin 11.3, platelet 198, sodium 134, potassium 3.2, lactic acid 1.3 Right leg wound had granulation tissue, no purulent discharge Admitted to [facility] Orthopedic and vascular surgery consulted  Subjective: Patient was seen and examined this morning. Lying down on bed.  Not in distress.  Wife at bedside. Continues to have redness, warmth, swelling of right lower extremity.  Some skin wrinkling noted on the thigh but he still very swollen.  States he has not been able to ambulate because of the pain and inability to flex right knee. Tmax 100.2 last night.  No labs today  Assessment and plan: Acute cellulitis of right lower extremity Bilateral lower extremity lymphedema R>L Right leg venous stasis ulcers Patient has chronic bilateral lower EXTR lymphedema related to his age, multiple peripheral vascular interventions and also history of right leg DVT several years ago, previously on Coumadin.   Presented with rapidly worsening swelling, redness, warmth of right lower extremity Started on IV antibiotics By orthopedics and vascular surgery.  No imaging advised. Venous  duplex unable to rule out DVT because of significant weight.  But it was able to see enlarged lymph nodes in the groin.  On clinical grounds, I would believe that his RLE swelling is due to infection and not blood clot. Continue to monitor temperature trend, clinical improvement and repeat white count tomorrow. If not improving, may need imaging to rule out soft tissue collection or bony involvement. Continue local wound care per wound care instructions. Needs to continue to follow-up with vascular surgery as outpatient for lymphedema pump, medical grade compression stockings. Wound care to follow-up Continues to have significant swelling involving the right lower extremity.  Needs to continue IV antibiotics for now Recent Labs  Lab 09/04/23 1848 09/04/23 2055 09/05/23 0330  WBC 14.9*  --  15.7*  LATICACIDVEN  --  1.3  --    H/o CHF, HTN Per last cardiology note, echocardiogram could not assess LV function because of morbid obesity.  PTA meds- Coreg  25 mg twice daily, Lasix  80 mg twice daily, Aldactone  25 mg daily, Farxiga  10 mg daily hydralazine  Currently continued on home doses of Coreg , Lasix , Aldactone  Blood pressure stable.  Continue to monitor IV hydralazine  as needed.  Type 2 diabetes mellitus A1c 6.3 on 09/05/2023 PTA meds-Farxiga  Currently on SSI/Accu-Cheks Recent Labs  Lab 09/06/23 0811 09/06/23 1159 09/06/23 1707 09/06/23 2120 09/07/23 0722  GLUCAP 118* 120* 134* 125* 186*   Morbid Obesity  Body mass index is 64.45 kg/m. Patient has been advised to make an attempt to improve diet and exercise patterns to aid in weight loss. Patient states he is in the process of starting Ozempic   OSA/OHS On nocturnal CPAP  H/o remote DVT Previously on  Coumadin.  Currently not on anticoagulation Start Lovenox  subcu for DVT prophylaxis  H/o polycythemia  Chronic anemia He was seen by Dr. Autumn recently as an outpatient.  Patient previously had polycythemia thought to be a  physiological response to hypoxia d/t OSA.  Noted a plan is to maintain hematocrit below 55 with intermittent therapeutic phlebotomy.  Lately he has anemia.  No active bleeding.  Continue to monitor Hemoglobin trend as below.  Remains above 10. Recent Labs    03/24/23 1509 04/21/23 1449 08/19/23 1443 09/04/23 1848 09/05/23 0330  HGB 12.4* 12.3* 12.5* 11.3* 10.6*  MCV 78.6* 79.5* 80.0 79.0* 80.7  FERRITIN 83 105 211  --   --   TIBC 350 328 294  --   --   IRON 40* 32* 36*  --   --     Chronic pain diabetic peripheral neuropathy Currently on Oxy, gabapentin    Mobility: Encourage ambulation.  Unable to ambulate on his own.  Pain and difficulty flexing right knee.  Obtain PT eval.  May need DME  Goals of care   Code Status: Full Code     DVT prophylaxis:  SCDs Start: 09/05/23 0035 Ordered for Lovenox  subcu   Antimicrobials: IV Rocephin , IV Zyvox  Fluid: None Consultants: Orthopedics, vascular surgery Family Communication: Wife at bedside  Status: Inpatient Level of care:  Med-Surg   Patient is from: Home Needs to continue in-hospital care: Needs IV antibiotics and further monitoring.  May need imaging as well Anticipated d/c to: Hopefully home in 2 to 3 days      Diet:  Diet Order             Diet Carb Modified Fluid consistency: Thin; Room service appropriate? Yes  Diet effective now                   Scheduled Meds:  carvedilol   25 mg Oral BID   dapagliflozin  propanediol  10 mg Oral QAC breakfast   enoxaparin  (LOVENOX ) injection  100 mg Subcutaneous Q24H   furosemide   80 mg Oral BID   gabapentin   300 mg Oral TID   insulin aspart  0-15 Units Subcutaneous TID WC   insulin aspart  0-5 Units Subcutaneous QHS   sodium chloride  flush  3 mL Intravenous Q12H   spironolactone   25 mg Oral Daily    PRN meds: acetaminophen  **OR** acetaminophen , HYDROmorphone (DILAUDID) injection, melatonin, naLOXone (NARCAN)  injection, ondansetron  (ZOFRAN ) IV, oxyCODONE    Infusions:   cefTRIAXone  (ROCEPHIN )  IV 1 g (09/06/23 1033)   linezolid  (ZYVOX ) IV 600 mg (09/07/23 9077)    Antimicrobials: Anti-infectives (From admission, onward)    Start     Dose/Rate Route Frequency Ordered Stop   09/05/23 1100  cefTRIAXone  (ROCEPHIN ) 1 g in sodium chloride  0.9 % 100 mL IVPB        1 g 200 mL/hr over 30 Minutes Intravenous Every 24 hours 09/05/23 0041     09/05/23 1000  linezolid  (ZYVOX ) IVPB 600 mg        600 mg 300 mL/hr over 60 Minutes Intravenous Every 12 hours 09/05/23 0829     09/04/23 2215  vancomycin  (VANCOCIN ) IVPB 1000 mg/200 mL premix  Status:  Discontinued       Placed in Followed by Linked Group   1,000 mg 200 mL/hr over 60 Minutes Intravenous  Once 09/04/23 2113 09/04/23 2114   09/04/23 2130  linezolid  (ZYVOX ) IVPB 600 mg        600 mg 300 mL/hr over  60 Minutes Intravenous  Once 09/04/23 2123 09/05/23 1118   09/04/23 2115  vancomycin  (VANCOCIN ) IVPB 1000 mg/200 mL premix  Status:  Discontinued       Placed in Followed by Linked Group   1,000 mg 200 mL/hr over 60 Minutes Intravenous  Once 09/04/23 2113 09/04/23 2114   09/04/23 2100  vancomycin  (VANCOCIN ) IVPB 1000 mg/200 mL premix  Status:  Discontinued        1,000 mg 200 mL/hr over 60 Minutes Intravenous  Once 09/04/23 2057 09/04/23 2113   09/04/23 2100  cefTRIAXone  (ROCEPHIN ) 2 g in sodium chloride  0.9 % 100 mL IVPB        2 g 200 mL/hr over 30 Minutes Intravenous  Once 09/04/23 2057 09/04/23 2209       Objective: Vitals:   09/07/23 0634 09/07/23 0908  BP: (!) 153/99 (!) 140/75  Pulse: 95 88  Resp: 18 16  Temp: 98.1 F (36.7 C)   SpO2: 99% 92%    Intake/Output Summary (Last 24 hours) at 09/07/2023 1029 Last data filed at 09/07/2023 9077 Gross per 24 hour  Intake 1480 ml  Output 1570 ml  Net -90 ml   Filed Weights   09/04/23 1810 09/05/23 0001  Weight: (!) 230.2 kg (!) 227.7 kg   Weight change:  Body mass index is 64.45 kg/m.   Physical Exam: General exam:  Pleasant, morbidly obese middle-aged African-American male Skin: No rashes, lesions or ulcers. HEENT: Atraumatic, normocephalic, no obvious bleeding Lungs: Clear to auscultation bilaterally,  CVS: S1, S2, no murmur,   GI/Abd: Soft, nontender, distended from obesity, bowel sound present,   CNS: Alert, awake, oriented x 3 Psychiatry: Mood appropriate Extremities: Has bilateral lower EXTR lymphedema R>L, right lower extremity swollen up to thigh, with redness and warmth.  Some shrinking of overlying skin noted today.  Data Review: I have personally reviewed the laboratory data and studies available.  F/u labs ordered Unresulted Labs (From admission, onward)     Start     Ordered   09/12/23 0500  Creatinine, serum  (enoxaparin  (LOVENOX )    CrCl >/= 30 ml/min)  Weekly,   R     Comments: while on enoxaparin  therapy    09/05/23 1201   09/08/23 0500  CBC with Differential/Platelet  Tomorrow morning,   R       Question:  Specimen collection method  Answer:  Lab=Lab collect   09/07/23 0736   09/08/23 0500  Basic metabolic panel with GFR  Tomorrow morning,   R       Question:  Specimen collection method  Answer:  Lab=Lab collect   09/07/23 0736            Signed, Chapman Rota, MD [facility] 09/07/2023

## 2023-09-08 ENCOUNTER — Inpatient Hospital Stay (HOSPITAL_COMMUNITY)

## 2023-09-08 DIAGNOSIS — L03115 Cellulitis of right lower limb: Secondary | ICD-10-CM | POA: Diagnosis not present

## 2023-09-08 LAB — CBC WITH DIFFERENTIAL/PLATELET
Abs Immature Granulocytes: 0.61 10*3/uL — ABNORMAL HIGH (ref 0.00–0.07)
Basophils Absolute: 0.1 10*3/uL (ref 0.0–0.1)
Basophils Relative: 0 %
Eosinophils Absolute: 0.2 10*3/uL (ref 0.0–0.5)
Eosinophils Relative: 1 %
HCT: 33.3 % — ABNORMAL LOW (ref 39.0–52.0)
Hemoglobin: 10.4 g/dL — ABNORMAL LOW (ref 13.0–17.0)
Immature Granulocytes: 5 %
Lymphocytes Relative: 8 %
Lymphs Abs: 1.1 10*3/uL (ref 0.7–4.0)
MCH: 25.2 pg — ABNORMAL LOW (ref 26.0–34.0)
MCHC: 31.2 g/dL (ref 30.0–36.0)
MCV: 80.8 fL (ref 80.0–100.0)
Monocytes Absolute: 1.3 10*3/uL — ABNORMAL HIGH (ref 0.1–1.0)
Monocytes Relative: 9 %
Neutro Abs: 10.2 10*3/uL — ABNORMAL HIGH (ref 1.7–7.7)
Neutrophils Relative %: 77 %
Platelets: 271 10*3/uL (ref 150–400)
RBC: 4.12 MIL/uL — ABNORMAL LOW (ref 4.22–5.81)
RDW: 16.9 % — ABNORMAL HIGH (ref 11.5–15.5)
WBC: 13.4 10*3/uL — ABNORMAL HIGH (ref 4.0–10.5)
nRBC: 0 % (ref 0.0–0.2)

## 2023-09-08 LAB — BASIC METABOLIC PANEL WITH GFR
Anion gap: 10 (ref 5–15)
BUN: 11 mg/dL (ref 6–20)
CO2: 28 mmol/L (ref 22–32)
Calcium: 8.3 mg/dL — ABNORMAL LOW (ref 8.9–10.3)
Chloride: 94 mmol/L — ABNORMAL LOW (ref 98–111)
Creatinine, Ser: 0.89 mg/dL (ref 0.61–1.24)
GFR, Estimated: >60 mL/min (ref >60)
Glucose, Bld: 118 mg/dL — ABNORMAL HIGH (ref 70–99)
Potassium: 3.5 mmol/L (ref 3.5–5.1)
Sodium: 132 mmol/L — ABNORMAL LOW (ref 135–145)

## 2023-09-08 LAB — GLUCOSE, CAPILLARY
Glucose-Capillary: 119 mg/dL — ABNORMAL HIGH (ref 70–99)
Glucose-Capillary: 112 mg/dL — ABNORMAL HIGH (ref 70–99)
Glucose-Capillary: 115 mg/dL — ABNORMAL HIGH (ref 70–99)
Glucose-Capillary: 119 mg/dL — ABNORMAL HIGH (ref 70–99)

## 2023-09-08 MED ORDER — HYDRALAZINE HCL 50 MG PO TABS
50.0000 mg | ORAL_TABLET | Freq: Two times a day (BID) | ORAL | Status: DC
  Administered 2023-09-08 – 2023-09-11 (×7): 50 mg via ORAL
  Filled 2023-09-08 (×7): qty 1

## 2023-09-08 NOTE — Progress Notes (Signed)
 PROGRESS NOTE  [name]  DOB: 03-31-1980  PCP: Tanda Bleacher, MD FMW:[id number]  DOA: 09/04/2023  LOS: 4 days  Hospital Day: 5  Brief narrative: [name] is a 43 y.o. male with PMH significant for morbid obesity, chronic venous insufficiency, chronic lower extremity lymphedema R>L with stasis ulcers, h/o DVT previously on Coumadin, OSA on CPAP, CHF, anemia, chronic pain,diabetic neuropathy Patient follows up with wound care center for lower extremities wound. Per outpatient vascular note patient had also undergone multiple greater saphenous vein procedures in the past.    6/26, patient presented to the ED with complaint of rapidly worsening swelling, redness, pain of right lower extremity.  Also the pre-existing right leg ulcer opened up.  In the ED, patient was afebrile, hemodynamically stable, WBC count 14.9, hemoglobin 11.3, platelet 198, sodium 134, potassium 3.2, lactic acid 1.3 Right leg wound had granulation tissue, no purulent discharge Admitted to [facility] Orthopedic and vascular surgery consulted  Subjective: Patient was seen and examined this morning. Lying on bed.  Wife available on the phone. Mild strengthening of the right thigh noted but he still is warm and tender and also not able to put a lot of weight on that extremity.  MRI right lower extremity ordered.  Assessment and plan: Acute cellulitis of right lower extremity Bilateral lower extremity lymphedema R>L Right leg venous stasis ulcers Patient has chronic bilateral lower EXTR lymphedema related to his age, multiple peripheral vascular interventions and also history of right leg DVT several years ago, previously on Coumadin.   Presented with rapidly worsening swelling, redness, warmth of right lower extremity Started on IV antibiotics Was seen orthopedics and vascular surgery.  No imaging was advised at that time.  However despite antibiotic, patient continues to have significant pain and right thigh, WBC  count remains elevated.. I ordered for MRI femur and tibia-fibula today to rule out abscess/osteomyelitis. Venous duplex was also obtained which was unable to rule out DVT because of significant weight.  But it was able to see enlarged lymph nodes in the groin.  On clinical grounds, I would believe that his RLE swelling is due to infection and not blood clot. Continue local wound care per wound care instructions. Needs to continue to follow-up with vascular surgery as outpatient for lymphedema pump, medical grade compression stockings. Wound care to follow-up Recent Labs  Lab 09/04/23 1848 09/04/23 2055 09/05/23 0330 09/08/23 0413  WBC 14.9*  --  15.7* 13.4*  LATICACIDVEN  --  1.3  --   --    H/o CHF, HTN Per last cardiology note, echocardiogram could not assess LV function because of morbid obesity.  PTA meds- Coreg  25 mg twice daily, Lasix  80 mg twice daily, Aldactone  25 mg daily, Farxiga  10 mg daily hydralazine  50 mg twice daily Currently on home doses of Coreg , Lasix , Aldactone  and Farxiga .  Blood pressure uptrending.  Resume hydralazine  today. Blood pressure stable.  Continue to monitor IV hydralazine  as needed.  Hyponatremia Sodium level running low likely related to diuresis Recent Labs  Lab 09/04/23 1848 09/05/23 0330 09/08/23 0413  NA 134* 133* 132*   Type 2 diabetes mellitus A1c 6.3 on 09/05/2023 PTA meds-Farxiga  Currently continued on Farxiga  and SSI/Accu-Cheks Recent Labs  Lab 09/07/23 1133 09/07/23 1723 09/07/23 2021 09/08/23 0722 09/08/23 1122  GLUCAP 126* 113* 148* 119* 112*   Morbid Obesity  Body mass index is 64.45 kg/m. Patient has been advised to make an attempt to improve diet and exercise patterns to aid in weight  loss. Patient states he is in the process of starting Ozempic   OSA/OHS On nocturnal CPAP  H/o remote DVT Previously on Coumadin.  Currently not on anticoagulation Lovenox  subcu for DVT prophylaxis  H/o polycythemia  Chronic  anemia He was seen by Dr. Autumn recently as an outpatient.  Patient previously had polycythemia thought to be a physiological response to hypoxia d/t OSA.  Noted a plan is to maintain hematocrit below 55 with intermittent therapeutic phlebotomy.  Lately he has anemia.  No active bleeding.  Continue to monitor Hemoglobin trend as below.  Remains above 10. Recent Labs    03/24/23 1509 04/21/23 1449 08/19/23 1443 09/04/23 1848 09/05/23 0330 09/08/23 0413  HGB 12.4* 12.3* 12.5* 11.3* 10.6* 10.4*  MCV 78.6* 79.5* 80.0 79.0* 80.7 80.8  FERRITIN 83 105 211  --   --   --   TIBC 350 328 294  --   --   --   IRON 40* 32* 36*  --   --   --     Chronic pain diabetic peripheral neuropathy Currently on Oxy, gabapentin    Mobility: Encourage ambulation.  Unable to ambulate on his own.  Pain and difficulty flexing right knee.  PT eval obtained today.  Bariatric rolling walker advised.  Goals of care   Code Status: Full Code     DVT prophylaxis:  SCDs Start: 09/05/23 0035 Ordered for Lovenox  subcu   Antimicrobials: IV Rocephin , IV Zyvox  Fluid: None Consultants: Orthopedics, vascular surgery Family Communication: Wife available on the phone  Status: Inpatient Level of care:  Med-Surg   Patient is from: Home Needs to continue in-hospital care: Needs IV antibiotics and further monitoring.  Pending MRI right lower extremity. Anticipated d/c to: Pending clinical course   Diet:  Diet Order             Diet Carb Modified Fluid consistency: Thin; Room service appropriate? Yes  Diet effective now                   Scheduled Meds:  carvedilol   25 mg Oral BID   dapagliflozin  propanediol  10 mg Oral QAC breakfast   enoxaparin  (LOVENOX ) injection  110 mg Subcutaneous Q24H   furosemide   80 mg Oral BID   gabapentin   300 mg Oral TID   hydrALAZINE   50 mg Oral BID   insulin aspart  0-15 Units Subcutaneous TID WC   insulin aspart  0-5 Units Subcutaneous QHS   sodium chloride  flush  3  mL Intravenous Q12H   spironolactone   25 mg Oral Daily    PRN meds: acetaminophen  **OR** acetaminophen , HYDROmorphone (DILAUDID) injection, melatonin, naLOXone (NARCAN)  injection, ondansetron  (ZOFRAN ) IV, oxyCODONE   Infusions:   cefTRIAXone  (ROCEPHIN )  IV 1 g (09/08/23 1042)   linezolid  (ZYVOX ) IV 600 mg (09/08/23 0922)    Antimicrobials: Anti-infectives (From admission, onward)    Start     Dose/Rate Route Frequency Ordered Stop   09/05/23 1100  cefTRIAXone  (ROCEPHIN ) 1 g in sodium chloride  0.9 % 100 mL IVPB        1 g 200 mL/hr over 30 Minutes Intravenous Every 24 hours 09/05/23 0041     09/05/23 1000  linezolid  (ZYVOX ) IVPB 600 mg        600 mg 300 mL/hr over 60 Minutes Intravenous Every 12 hours 09/05/23 0829     09/04/23 2215  vancomycin  (VANCOCIN ) IVPB 1000 mg/200 mL premix  Status:  Discontinued       Placed in Followed by Linked  Group   1,000 mg 200 mL/hr over 60 Minutes Intravenous  Once 09/04/23 2113 09/04/23 2114   09/04/23 2130  linezolid  (ZYVOX ) IVPB 600 mg        600 mg 300 mL/hr over 60 Minutes Intravenous  Once 09/04/23 2123 09/05/23 1118   09/04/23 2115  vancomycin  (VANCOCIN ) IVPB 1000 mg/200 mL premix  Status:  Discontinued       Placed in Followed by Linked Group   1,000 mg 200 mL/hr over 60 Minutes Intravenous  Once 09/04/23 2113 09/04/23 2114   09/04/23 2100  vancomycin  (VANCOCIN ) IVPB 1000 mg/200 mL premix  Status:  Discontinued        1,000 mg 200 mL/hr over 60 Minutes Intravenous  Once 09/04/23 2057 09/04/23 2113   09/04/23 2100  cefTRIAXone  (ROCEPHIN ) 2 g in sodium chloride  0.9 % 100 mL IVPB        2 g 200 mL/hr over 30 Minutes Intravenous  Once 09/04/23 2057 09/04/23 2209       Objective: Vitals:   09/08/23 0846 09/08/23 1050  BP: (!) 157/94   Pulse: 92   Resp: 20   Temp: 100 F (37.8 C) 98.2 F (36.8 C)  SpO2: 93%     Intake/Output Summary (Last 24 hours) at 09/08/2023 1318 Last data filed at 09/08/2023 1043 Gross per 24 hour   Intake 1780 ml  Output 4880 ml  Net -3100 ml   Filed Weights   09/04/23 1810 09/05/23 0001  Weight: (!) 230.2 kg (!) 227.7 kg   Weight change:  Body mass index is 64.45 kg/m.   Physical Exam: General exam: Pleasant, morbidly obese middle-aged African-American male Skin: No rashes, lesions or ulcers. HEENT: Atraumatic, normocephalic, no obvious bleeding Lungs: Clear to auscultation bilaterally,  CVS: S1, S2, no murmur,   GI/Abd: Soft, nontender, distended from obesity, bowel sound present,   CNS: Alert, awake, oriented x 3 Psychiatry: Mood appropriate Extremities: Has bilateral lower EXTR lymphedema R>L, right lower extremity still has swelling to thigh, with redness, tenderness and warmth.   Data Review: I have personally reviewed the laboratory data and studies available.  F/u labs ordered Unresulted Labs (From admission, onward)    None       Signed, Chapman Rota, MD [facility] 09/08/2023

## 2023-09-08 NOTE — TOC Progression Note (Signed)
 Transition of Care [facility]) - Progression Note    Patient Details  Name: [name] MRN: [id number] Date of Birth: Mar 25, 1980  Transition of Care [facility]) CM/SW Contact  Alfonse JONELLE Rex, RN Phone Number: 09/08/2023, 3:21 PM  Clinical Narrative:  Remains on iv abx, monitoring. TOC will continue to follow.      Expected Discharge Plan: Home w Home Health Services Barriers to Discharge: Continued Medical Work up  Expected Discharge Plan and Services       Living arrangements for the past 2 months: Single Family Home                                       Social Determinants of Health (SDOH) Interventions SDOH Screenings   Food Insecurity: No Food Insecurity (09/05/2023)  Housing: Low Risk  (09/05/2023)  Transportation Needs: No Transportation Needs (09/05/2023)  Utilities: Not At Risk (09/05/2023)  Alcohol Screen: Low Risk  (04/03/2023)  Depression (PHQ2-9): Low Risk  (04/03/2023)  Financial Resource Strain: Low Risk  (04/03/2023)  Physical Activity: Sufficiently Active (04/03/2023)  Social Connections: Socially Integrated (09/05/2023)  Stress: No Stress Concern Present (04/03/2023)  Tobacco Use: Low Risk  (09/04/2023)    Readmission Risk Interventions    09/05/2023    1:20 PM  Readmission Risk Prevention Plan  Post Dischage Appt Complete  Medication Screening Complete  Transportation Screening Complete

## 2023-09-08 NOTE — Plan of Care (Signed)
  Problem: Education: Goal: Knowledge of General Education information will improve Description: Including pain rating scale, medication(s)/side effects and non-pharmacologic comfort measures Outcome: Progressing   Problem: Health Behavior/Discharge Planning: Goal: Ability to manage health-related needs will improve Outcome: Progressing   Problem: Clinical Measurements: Goal: Ability to maintain clinical measurements within normal limits will improve Outcome: Progressing Goal: Will remain free from infection Outcome: Progressing Goal: Diagnostic test results will improve Outcome: Progressing Goal: Respiratory complications will improve Outcome: Progressing Goal: Cardiovascular complication will be avoided Outcome: Progressing   Problem: Activity: Goal: Risk for activity intolerance will decrease Outcome: Progressing   Problem: Coping: Goal: Level of anxiety will decrease Outcome: Progressing   Problem: Elimination: Goal: Will not experience complications related to bowel motility Outcome: Progressing   Problem: Pain Managment: Goal: General experience of comfort will improve and/or be controlled Outcome: Progressing   Problem: Safety: Goal: Ability to remain free from injury will improve Outcome: Progressing   Problem: Skin Integrity: Goal: Risk for impaired skin integrity will decrease Outcome: Progressing   Problem: Education: Goal: Ability to describe self-care measures that may prevent or decrease complications (Diabetes Survival Skills Education) will improve Outcome: Progressing Goal: Individualized Educational Video(s) Outcome: Progressing   Problem: Coping: Goal: Ability to adjust to condition or change in health will improve Outcome: Progressing   Problem: Fluid Volume: Goal: Ability to maintain a balanced intake and output will improve Outcome: Progressing   Problem: Health Behavior/Discharge Planning: Goal: Ability to identify and utilize  available resources and services will improve Outcome: Progressing Goal: Ability to manage health-related needs will improve Outcome: Progressing   Problem: Metabolic: Goal: Ability to maintain appropriate glucose levels will improve Outcome: Progressing   Problem: Nutritional: Goal: Maintenance of adequate nutrition will improve Outcome: Progressing Goal: Progress toward achieving an optimal weight will improve Outcome: Progressing   Problem: Skin Integrity: Goal: Risk for impaired skin integrity will decrease Outcome: Progressing   Problem: Tissue Perfusion: Goal: Adequacy of tissue perfusion will improve Outcome: Progressing

## 2023-09-08 NOTE — Evaluation (Signed)
 Physical Therapy Evaluation Patient Details Name: [name] MRN: [id number] DOB: 1980-10-12 Today's Date: 09/08/2023  History of Present Illness  43 y.o. male with RLE cellulitis and venous stasis ulcers, bil LE lymphedema. doppler RLE ltd d/t body habitus. unable to do MRI.  PMH : significant for morbid obesity, chronic venous insufficiency, chronic lower extremity lymphedema R>L with stasis ulcers, h/o DVT previously on Coumadin, OSA on CPAP, CHF, anemia, chronic pain,diabetic neuropathy  Clinical Impression  Pt admitted with above diagnosis.  Pt ind at baseline. Able to amb 87' with RW and supervision, 3 brief standing rests d/t fatigue/LE pain. May need bariatric RW pending resolution of RLE pain with activity, will continue to assess. No f/u indicated post acute   Pt currently with functional limitations due to the deficits listed below (see PT Problem List). Pt will benefit from acute skilled PT to increase their independence and safety with mobility to allow discharge.           If plan is discharge home, recommend the following:     Can travel by private vehicle        Equipment Recommendations Other (comment) (TBD-?bari RW)  Recommendations for Other Services       Functional Status Assessment Patient has had a recent decline in their functional status and demonstrates the ability to make significant improvements in function in a reasonable and predictable amount of time.     Precautions / Restrictions Precautions Precautions: Fall Restrictions Weight Bearing Restrictions Per Provider Order: No      Mobility  Bed Mobility Overal bed mobility: Needs Assistance Bed Mobility: Supine to Sit     Supine to sit: Supervision     General bed mobility comments: pt able to self assist RLE using bed sheet. incr time. supervision for lines    Transfers Overall transfer level: Needs assistance Equipment used: Rolling walker (2 wheels) Transfers: Sit to/from Stand Sit  to Stand: From elevated surface, Supervision           General transfer comment: supervision for safety    Ambulation/Gait Ambulation/Gait assistance: Supervision Gait Distance (Feet): 50 Feet Assistive device: Rolling walker (2 wheels) (bariatric RW) Gait Pattern/deviations: Step-to pattern, Wide base of support       General Gait Details: maintains RLE in external rotation, limited joint excursion throughout RLE, cues for sequence; distance to tolerance; 3 brief standing rests  Stairs            Wheelchair Mobility     Tilt Bed    Modified Rankin (Stroke Patients Only)       Balance Overall balance assessment: Needs assistance Sitting-balance support: No upper extremity supported, Feet supported Sitting balance-Leahy Scale: Good     Standing balance support: During functional activity, Reliant on assistive device for balance, No upper extremity supported Standing balance-Leahy Scale: Fair Standing balance comment: able to take a few steps without UE support; reliant on device for incr mobility/distance d/t LE pain                             Pertinent Vitals/Pain Pain Assessment Pain Assessment: Faces Faces Pain Scale: Hurts little more Pain Location: right knee with activity.movement Pain Descriptors / Indicators: Sore, Aching Pain Intervention(s): Limited activity within patient's tolerance, Monitored during session, Premedicated before session    Home Living Family/patient expects to be discharged to:: Private residence Living Arrangements: Spouse/significant other;Children Available Help at Discharge: Family Type of Home: [facility]  Access: Level entry       Home Layout: One level Home Equipment: None      Prior Function Prior Level of Function : Independent/Modified Independent;Working/employed;Driving                     Extremity/Trunk Assessment   Upper Extremity Assessment Upper Extremity Assessment: Overall WFL  for tasks assessed    Lower Extremity Assessment Lower Extremity Assessment: RLE deficits/detail;LLE deficits/detail RLE Deficits / Details: ROM limited by body habitus/lymphedema. able to perform ltd AROM ankle, knee flexion ~ 35 degrees. unable to SLR with out assist RLE: Unable to fully assess due to pain LLE Deficits / Details: grossly WFL for activities tested       Communication   Communication Communication: No apparent difficulties    Cognition Arousal: Alert Behavior During Therapy: WFL for tasks assessed/performed   PT - Cognitive impairments: No apparent impairments                         Following commands: Intact       Cueing Cueing Techniques: Verbal cues     General Comments      Exercises     Assessment/Plan    PT Assessment Patient needs continued PT services  PT Problem List Decreased mobility;Pain;Decreased balance;Decreased knowledge of use of DME;Decreased activity tolerance       PT Treatment Interventions DME instruction;Therapeutic exercise;Gait training;Functional mobility training;Therapeutic activities;Patient/family education    PT Goals (Current goals can be found in the Care Plan section)  Acute Rehab PT Goals PT Goal Formulation: With patient Time For Goal Achievement: 09/22/23 Potential to Achieve Goals: Good    Frequency Min 2X/week     Co-evaluation               AM-PAC PT 6 Clicks Mobility  Outcome Measure Help needed turning from your back to your side while in a flat bed without using bedrails?: A Little Help needed moving from lying on your back to sitting on the side of a flat bed without using bedrails?: A Little Help needed moving to and from a bed to a chair (including a wheelchair)?: A Little Help needed standing up from a chair using your arms (e.g., wheelchair or bedside chair)?: A Little Help needed to walk in hospital room?: A Little Help needed climbing 3-5 steps with a railing? : A  Little 6 Click Score: 18    End of Session   Activity Tolerance: Patient tolerated treatment well Patient left: in chair;with call bell/phone within reach;with chair alarm set Nurse Communication: Mobility status PT Visit Diagnosis: Other abnormalities of gait and mobility (R26.89)    Time: 8948-8876 PT Time Calculation (min) (ACUTE ONLY): 32 min   Charges:   PT Evaluation $PT Eval Low Complexity: 1 Low PT Treatments $Gait Training: 8-22 mins PT General Charges $$ ACUTE PT VISIT: 1 Visit         Bennett Vanscyoc, PT  Acute Rehab Dept [facility]) [phone]  09/08/2023   [facility] 09/08/2023, 11:41 AM

## 2023-09-08 NOTE — Progress Notes (Signed)
   09/08/23 2323  BiPAP/CPAP/SIPAP  BiPAP/CPAP/SIPAP Pt Type Adult  BiPAP/CPAP/SIPAP Resmed  Mask Type Nasal mask  Dentures removed? Not applicable  Mask Size Large  FiO2 (%) 21 %  Patient Home Machine No  Patient Home Mask No  Patient Home Tubing No  Auto Titrate Yes  Minimum cmH2O 18 cmH2O  Maximum cmH2O 20 cmH2O  Device Plugged into [employer] Yes

## 2023-09-09 ENCOUNTER — Encounter (HOSPITAL_COMMUNITY): Payer: Self-pay | Admitting: Internal Medicine

## 2023-09-09 ENCOUNTER — Inpatient Hospital Stay (HOSPITAL_COMMUNITY)

## 2023-09-09 DIAGNOSIS — L03115 Cellulitis of right lower limb: Secondary | ICD-10-CM | POA: Diagnosis not present

## 2023-09-09 LAB — GLUCOSE, CAPILLARY
Glucose-Capillary: 112 mg/dL — ABNORMAL HIGH (ref 70–99)
Glucose-Capillary: 122 mg/dL — ABNORMAL HIGH (ref 70–99)
Glucose-Capillary: 123 mg/dL — ABNORMAL HIGH (ref 70–99)
Glucose-Capillary: 176 mg/dL — ABNORMAL HIGH (ref 70–99)

## 2023-09-09 MED ORDER — IOHEXOL 300 MG/ML  SOLN
125.0000 mL | Freq: Once | INTRAMUSCULAR | Status: AC | PRN
  Administered 2023-09-09: 125 mL via INTRAVENOUS

## 2023-09-09 NOTE — Progress Notes (Signed)
 Mobility Specialist - Progress Note   09/09/23 1251  Mobility  Activity Ambulated with assistance in hallway  Level of Assistance Standby assist, set-up cues, supervision of patient - no hands on  Assistive Device Front wheel walker  Distance Ambulated (ft) 80 ft  Activity Response Tolerated well  Mobility Referral Yes  Mobility visit 1 Mobility  Mobility Specialist Start Time (ACUTE ONLY) 1229  Mobility Specialist Stop Time (ACUTE ONLY) 1250  Mobility Specialist Time Calculation (min) (ACUTE ONLY) 21 min   Pt received in bed and agreeable to mobility. No complaints during session. Pt to recliner after session with all needs met.    [facility]

## 2023-09-09 NOTE — Progress Notes (Signed)
 PROGRESS NOTE  [name]  DOB: Jul 25, 1980  PCP: Tanda Bleacher, MD FMW:[id number]  DOA: 09/04/2023  LOS: 5 days  Hospital Day: 6  Brief narrative: [name] is a 43 y.o. male with PMH significant for morbid obesity, chronic venous insufficiency, chronic lower extremity lymphedema R>L with stasis ulcers, h/o DVT previously on Coumadin, OSA on CPAP, CHF, anemia, chronic pain,diabetic neuropathy Patient follows up with wound care center for lower extremities wound. Per outpatient vascular note patient had also undergone multiple greater saphenous vein procedures in the past.    6/26, patient presented to the ED with complaint of rapidly worsening swelling, redness, pain of right lower extremity.  Also the pre-existing right leg ulcer opened up.  In the ED, patient was afebrile, hemodynamically stable, WBC count 14.9, lactic acid 1.3 On exam, had gross swelling of the right lower extremity with cellulitis up to the right thigh.  Right leg wound had granulation tissue, no purulent discharge Admitted to [facility] Orthopedic and vascular surgery consulted  Subjective: Patient was seen and examined this morning. Lying down in bed.  Wife on the phone.   Had 1 episode of fever of 102.8 last night.  I had ordered for MRI right lower extremity yesterday.  It seems it was canceled because of his weight.  I ordered for CT scan today.  Assessment and plan: Acute cellulitis of right lower extremity Bilateral lower extremity lymphedema R>L Right leg venous stasis ulcers Patient has chronic bilateral lower EXTR lymphedema related to his age, multiple peripheral vascular interventions and also history of right leg DVT several years ago, previously on Coumadin.   Presented with rapidly worsening swelling, redness, warmth of right lower extremity Started on IV antibiotics Was seen orthopedics and vascular surgery.  No imaging was advised at that time.  However despite antibiotic, patient continues to  have significant pain and right thigh, WBC count remains elevated.  Had an episode of fever last night. I had ordered for MRI right lower extremity yesterday.  It seems it was canceled because of his weight.  I ordered for CT scan today. Venous duplex was also obtained which was unable to rule out DVT because of significant weight.  But it was able to see enlarged lymph nodes in the groin.  On clinical grounds, I would believe that his RLE swelling is due to infection and not blood clot. Continue local wound care per wound care instructions. Needs to continue to follow-up with vascular surgery as outpatient for lymphedema pump, medical grade compression stockings. Wound care to follow-up Recent Labs  Lab 09/04/23 1848 09/04/23 2055 09/05/23 0330 09/08/23 0413  WBC 14.9*  --  15.7* 13.4*  LATICACIDVEN  --  1.3  --   --    H/o CHF, HTN Per last cardiology note, echocardiogram could not assess LV function because of morbid obesity.  PTA meds- Coreg  25 mg twice daily, Lasix  80 mg twice daily, Aldactone  25 mg daily, Farxiga  10 mg daily hydralazine  50 mg twice daily Currently continued on home doses of Coreg , Lasix , Aldactone  and Farxiga .  Blood pressure uptrending.  Resume hydralazine  today. Blood pressure stable.  Continue to monitor IV hydralazine  as needed.  Hyponatremia Sodium level running low likely related to diuresis Recent Labs  Lab 09/04/23 1848 09/05/23 0330 09/08/23 0413  NA 134* 133* 132*   Type 2 diabetes mellitus A1c 6.3 on 09/05/2023 PTA meds-Farxiga  Currently continued on Farxiga  and SSI/Accu-Cheks Recent Labs  Lab 09/08/23 0722 09/08/23 1122 09/08/23 1632 09/08/23 2156  09/09/23 0711  GLUCAP 119* 112* 115* 119* 112*   Morbid Obesity  Body mass index is 64.45 kg/m. Patient has been advised to make an attempt to improve diet and exercise patterns to aid in weight loss. Patient states he is in the pr next ocess of starting Ozempic   OSA/OHS On nocturnal  CPAP  H/o remote DVT Previously on Coumadin.  Currently not on anticoagulation Lovenox  subcu for DVT prophylaxis  H/o polycythemia  Chronic anemia He was seen by Dr. Autumn recently as an outpatient.  Patient previously had polycythemia thought to be a physiological response to hypoxia d/t OSA.  Noted a plan is to maintain hematocrit below 55 with intermittent therapeutic phlebotomy.  Lately he has anemia.  No active bleeding.  Continue to monitor Hemoglobin trend as below.  Remains above 10. Recent Labs    03/24/23 1509 04/21/23 1449 08/19/23 1443 09/04/23 1848 09/05/23 0330 09/08/23 0413  HGB 12.4* 12.3* 12.5* 11.3* 10.6* 10.4*  MCV 78.6* 79.5* 80.0 79.0* 80.7 80.8  FERRITIN 83 105 211  --   --   --   TIBC 350 328 294  --   --   --   IRON 40* 32* 36*  --   --   --     Chronic pain diabetic peripheral neuropathy Currently on Oxy, gabapentin    Mobility: Encourage ambulation. Patient reports he has been on putting weight on the right lower extremity. Seen by PT yesterday.    Bariatric rolling walker was advised.  Goals of care   Code Status: Full Code     DVT prophylaxis:  SCDs Start: 09/05/23 0035 Ordered for Lovenox  subcu   Antimicrobials: IV Rocephin , IV Zyvox  Fluid: None Consultants: Orthopedics, vascular surgery Family Communication: Wife available on the phone  Status: Inpatient Level of care:  Med-Surg   Patient is from: Home Needs to continue in-hospital care: Needs IV antibiotics and further monitoring.  Pending CT scan right lower extremity. Anticipated d/c to: Pending clinical course   Diet:  Diet Order             Diet Carb Modified Fluid consistency: Thin; Room service appropriate? Yes  Diet effective now                   Scheduled Meds:  carvedilol   25 mg Oral BID   dapagliflozin  propanediol  10 mg Oral QAC breakfast   enoxaparin  (LOVENOX ) injection  110 mg Subcutaneous Q24H   furosemide   80 mg Oral BID   gabapentin   300 mg Oral TID    hydrALAZINE   50 mg Oral BID   insulin aspart  0-15 Units Subcutaneous TID WC   insulin aspart  0-5 Units Subcutaneous QHS   sodium chloride  flush  3 mL Intravenous Q12H   spironolactone   25 mg Oral Daily    PRN meds: acetaminophen  **OR** acetaminophen , HYDROmorphone (DILAUDID) injection, melatonin, naLOXone (NARCAN)  injection, ondansetron  (ZOFRAN ) IV, oxyCODONE   Infusions:   cefTRIAXone  (ROCEPHIN )  IV 1 g (09/08/23 1042)   linezolid  (ZYVOX ) IV 600 mg (09/09/23 0954)    Antimicrobials: Anti-infectives (From admission, onward)    Start     Dose/Rate Route Frequency Ordered Stop   09/05/23 1100  cefTRIAXone  (ROCEPHIN ) 1 g in sodium chloride  0.9 % 100 mL IVPB        1 g 200 mL/hr over 30 Minutes Intravenous Every 24 hours 09/05/23 0041     09/05/23 1000  linezolid  (ZYVOX ) IVPB 600 mg  600 mg 300 mL/hr over 60 Minutes Intravenous Every 12 hours 09/05/23 0829     09/04/23 2215  vancomycin  (VANCOCIN ) IVPB 1000 mg/200 mL premix  Status:  Discontinued       Placed in Followed by Linked Group   1,000 mg 200 mL/hr over 60 Minutes Intravenous  Once 09/04/23 2113 09/04/23 2114   09/04/23 2130  linezolid  (ZYVOX ) IVPB 600 mg        600 mg 300 mL/hr over 60 Minutes Intravenous  Once 09/04/23 2123 09/05/23 1118   09/04/23 2115  vancomycin  (VANCOCIN ) IVPB 1000 mg/200 mL premix  Status:  Discontinued       Placed in Followed by Linked Group   1,000 mg 200 mL/hr over 60 Minutes Intravenous  Once 09/04/23 2113 09/04/23 2114   09/04/23 2100  vancomycin  (VANCOCIN ) IVPB 1000 mg/200 mL premix  Status:  Discontinued        1,000 mg 200 mL/hr over 60 Minutes Intravenous  Once 09/04/23 2057 09/04/23 2113   09/04/23 2100  cefTRIAXone  (ROCEPHIN ) 2 g in sodium chloride  0.9 % 100 mL IVPB        2 g 200 mL/hr over 30 Minutes Intravenous  Once 09/04/23 2057 09/04/23 2209       Objective: Vitals:   09/09/23 0450 09/09/23 0807  BP: (!) 147/79 (!) 151/96  Pulse: 97 75  Resp: 19 18   Temp: 98.3 F (36.8 C) 98.7 F (37.1 C)  SpO2: 93% 94%    Intake/Output Summary (Last 24 hours) at 09/09/2023 1008 Last data filed at 09/09/2023 0600 Gross per 24 hour  Intake 2140 ml  Output 3875 ml  Net -1735 ml   Filed Weights   09/04/23 1810 09/05/23 0001  Weight: (!) 230.2 kg (!) 227.7 kg   Weight change:  Body mass index is 64.45 kg/m.   Physical Exam: General exam: Pleasant, morbidly obese middle-aged African-American male Skin: No rashes, lesions or ulcers. HEENT: Atraumatic, normocephalic, no obvious bleeding Lungs: Clear to auscultation bilaterally,  CVS: S1, S2, no murmur,   GI/Abd: Soft, nontender, distended from obesity, bowel sound present,   CNS: Alert, awake, oriented x 3 Psychiatry: Mood appropriate Extremities: Has bilateral lower EXTR lymphedema R>L, right lower extremity still has swelling to thigh, with redness, tenderness and warmth.  Swelling seems to be somewhat better today but he still is warm and tender  Data Review: I have personally reviewed the laboratory data and studies available.  F/u labs ordered Unresulted Labs (From admission, onward)     Start     Ordered   09/10/23 0500  Basic metabolic panel with GFR  Tomorrow morning,   R       Question:  Specimen collection method  Answer:  Lab=Lab collect   09/09/23 1007   09/10/23 0500  CBC with Differential/Platelet  Tomorrow morning,   R       Question:  Specimen collection method  Answer:  Lab=Lab collect   09/09/23 1007            Signed, Chapman Rota, MD [facility] 09/09/2023

## 2023-09-09 NOTE — Progress Notes (Signed)
   09/08/23 2159  Assess: MEWS Score  Temp (!) 102.8 F (39.3 C)  BP (!) 163/75  MAP (mmHg) 100  Pulse Rate (!) 105  Resp 20  SpO2 98 %  O2 Device Room Air  Assess: MEWS Score  MEWS Temp 2  MEWS Systolic 0  MEWS Pulse 1  MEWS RR 0  MEWS LOC 0  MEWS Score 3  MEWS Score Color Yellow  Assess: if the MEWS score is Yellow or Red  Were vital signs accurate and taken at a resting state? Yes  Does the patient meet 2 or more of the SIRS criteria? Yes  Does the patient have a confirmed or suspected source of infection? Yes  MEWS guidelines implemented  Yes, yellow  Treat  MEWS Interventions Considered administering scheduled or prn medications/treatments as ordered  Take Vital Signs  Increase Vital Sign Frequency  Yellow: Q2hr x1, continue Q4hrs until patient remains green for 12hrs  Escalate  MEWS: Escalate Yellow: Discuss with charge nurse and consider notifying provider and/or RRT  Provider Notification  Date Provider Notified 09/08/23  Time Provider Notified 2206  Method of Notification Page  Notification Reason Change in status  Provider response No new orders  Date of Provider Response 09/08/23  Time of Provider Response 2243  Assess: SIRS CRITERIA  SIRS Temperature  1  SIRS Respirations  0  SIRS Pulse 1  SIRS WBC 0  SIRS Score Sum  2

## 2023-09-10 DIAGNOSIS — L03115 Cellulitis of right lower limb: Secondary | ICD-10-CM | POA: Diagnosis not present

## 2023-09-10 DIAGNOSIS — I890 Lymphedema, not elsewhere classified: Secondary | ICD-10-CM

## 2023-09-10 LAB — CBC WITH DIFFERENTIAL/PLATELET
Abs Immature Granulocytes: 0.34 10*3/uL — ABNORMAL HIGH (ref 0.00–0.07)
Basophils Absolute: 0.1 10*3/uL (ref 0.0–0.1)
Basophils Relative: 1 %
Eosinophils Absolute: 0.2 10*3/uL (ref 0.0–0.5)
Eosinophils Relative: 1 %
HCT: 34.3 % — ABNORMAL LOW (ref 39.0–52.0)
Hemoglobin: 10.7 g/dL — ABNORMAL LOW (ref 13.0–17.0)
Immature Granulocytes: 3 %
Lymphocytes Relative: 7 %
Lymphs Abs: 0.9 10*3/uL (ref 0.7–4.0)
MCH: 25.4 pg — ABNORMAL LOW (ref 26.0–34.0)
MCHC: 31.2 g/dL (ref 30.0–36.0)
MCV: 81.5 fL (ref 80.0–100.0)
Monocytes Absolute: 1.1 10*3/uL — ABNORMAL HIGH (ref 0.1–1.0)
Monocytes Relative: 9 %
Neutro Abs: 10.2 10*3/uL — ABNORMAL HIGH (ref 1.7–7.7)
Neutrophils Relative %: 79 %
Platelets: 289 10*3/uL (ref 150–400)
RBC: 4.21 MIL/uL — ABNORMAL LOW (ref 4.22–5.81)
RDW: 16.8 % — ABNORMAL HIGH (ref 11.5–15.5)
WBC: 12.8 10*3/uL — ABNORMAL HIGH (ref 4.0–10.5)
nRBC: 0 % (ref 0.0–0.2)

## 2023-09-10 LAB — BASIC METABOLIC PANEL WITH GFR
Anion gap: 12 (ref 5–15)
Anion gap: 9 (ref 5–15)
BUN: 12 mg/dL (ref 6–20)
BUN: 12 mg/dL (ref 6–20)
CO2: 26 mmol/L (ref 22–32)
CO2: 31 mmol/L (ref 22–32)
Calcium: 8.2 mg/dL — ABNORMAL LOW (ref 8.9–10.3)
Calcium: 8.8 mg/dL — ABNORMAL LOW (ref 8.9–10.3)
Chloride: 95 mmol/L — ABNORMAL LOW (ref 98–111)
Chloride: 95 mmol/L — ABNORMAL LOW (ref 98–111)
Creatinine, Ser: 0.88 mg/dL (ref 0.61–1.24)
Creatinine, Ser: 0.85 mg/dL (ref 0.61–1.24)
GFR, Estimated: >60 mL/min (ref >60)
GFR, Estimated: >60 mL/min (ref >60)
Glucose, Bld: 119 mg/dL — ABNORMAL HIGH (ref 70–99)
Glucose, Bld: 117 mg/dL — ABNORMAL HIGH (ref 70–99)
Potassium: 3.8 mmol/L (ref 3.5–5.1)
Potassium: 3.8 mmol/L (ref 3.5–5.1)
Sodium: 133 mmol/L — ABNORMAL LOW (ref 135–145)
Sodium: 135 mmol/L (ref 135–145)

## 2023-09-10 LAB — CULTURE, BLOOD (ROUTINE X 2)
Culture: NO GROWTH
Culture: NO GROWTH
Special Requests: ADEQUATE

## 2023-09-10 LAB — GLUCOSE, CAPILLARY
Glucose-Capillary: 111 mg/dL — ABNORMAL HIGH (ref 70–99)
Glucose-Capillary: 142 mg/dL — ABNORMAL HIGH (ref 70–99)
Glucose-Capillary: 126 mg/dL — ABNORMAL HIGH (ref 70–99)
Glucose-Capillary: 126 mg/dL — ABNORMAL HIGH (ref 70–99)

## 2023-09-10 MED ORDER — ORAL CARE MOUTH RINSE: 15.0000 mL | OROMUCOSAL | Status: DC | PRN

## 2023-09-10 NOTE — Plan of Care (Signed)
  Problem: Health Behavior/Discharge Planning: Goal: Ability to manage health-related needs will improve Outcome: Progressing   Problem: Clinical Measurements: Goal: Ability to maintain clinical measurements within normal limits will improve Outcome: Progressing Goal: Will remain free from infection Outcome: Progressing Goal: Diagnostic test results will improve Outcome: Progressing Goal: Respiratory complications will improve Outcome: Progressing Goal: Cardiovascular complication will be avoided Outcome: Progressing   Problem: Activity: Goal: Risk for activity intolerance will decrease Outcome: Progressing   Problem: Coping: Goal: Level of anxiety will decrease Outcome: Progressing   Problem: Pain Managment: Goal: General experience of comfort will improve and/or be controlled Outcome: Progressing   Problem: Safety: Goal: Ability to remain free from injury will improve Outcome: Progressing   Problem: Skin Integrity: Goal: Risk for impaired skin integrity will decrease Outcome: Progressing   Problem: Education: Goal: Ability to describe self-care measures that may prevent or decrease complications (Diabetes Survival Skills Education) will improve Outcome: Progressing   Problem: Coping: Goal: Ability to adjust to condition or change in health will improve Outcome: Progressing   Problem: Fluid Volume: Goal: Ability to maintain a balanced intake and output will improve Outcome: Progressing   Problem: Health Behavior/Discharge Planning: Goal: Ability to identify and utilize available resources and services will improve Outcome: Progressing Goal: Ability to manage health-related needs will improve Outcome: Progressing   Problem: Metabolic: Goal: Ability to maintain appropriate glucose levels will improve Outcome: Progressing   Problem: Nutritional: Goal: Maintenance of adequate nutrition will improve Outcome: Progressing Goal: Progress toward achieving an  optimal weight will improve Outcome: Progressing   Problem: Skin Integrity: Goal: Risk for impaired skin integrity will decrease Outcome: Progressing   Problem: Tissue Perfusion: Goal: Adequacy of tissue perfusion will improve Outcome: Progressing

## 2023-09-10 NOTE — Progress Notes (Signed)
 PROGRESS NOTE  [name]  DOB: 1980/09/09  PCP: Tanda Bleacher, MD FMW:[id number]  DOA: 09/04/2023  LOS: 6 days  Hospital Day: 7  Brief narrative: [name] is a 43 y.o. male with PMH significant for morbid obesity, chronic venous insufficiency, chronic lower extremity lymphedema R>L with stasis ulcers, h/o DVT previously on Coumadin, OSA on CPAP, CHF, anemia, chronic pain,diabetic neuropathy Patient follows up with wound care center for lower extremities wound. Per outpatient vascular note patient had also undergone multiple greater saphenous vein procedures in the past.    6/26, patient presented to the ED with complaint of rapidly worsening swelling, redness, pain of right lower extremity.  Also the pre-existing right leg ulcer opened up.  In the ED, patient was afebrile, hemodynamically stable, WBC count 14.9, lactic acid 1.3 On exam, had gross swelling of the right lower extremity with cellulitis up to the right thigh.  Right leg wound had granulation tissue, no purulent discharge Admitted to [facility] Orthopedic and vascular surgery consulted  Subjective: Was seen and examined this morning, Tmax 100.2, currently 98, BP 150/95, satting 92% room air  CT right lower extremity and imaging were reviewed with patient, no significant abscess Present on the phone   Assessment and plan: Acute cellulitis of right lower extremity Bilateral lower extremity lymphedema R>L Right leg venous stasis ulcers  - Per patient swelling and discomfort has been improving But still having significant discomfort  Patient has chronic bilateral lower EXTR lymphedema related to his age, multiple peripheral vascular interventions and also history of right leg DVT several years ago, previously on Coumadin.   Presented with rapidly worsening swelling, redness, warmth of right lower extremity Started on IV antibiotics  Was seen orthopedics and vascular surgery.  No imaging was advised at that time.   However despite antibiotic, patient continues to have significant pain and right thigh, WBC count remains elevated.  Had an episode of fever last night. I had ordered for MRI right lower extremity yesterday.  It seems it was canceled because of his weight.  -CT of right lower extremity was reviewed in detail, no signs of abscess or deep infection Changes consistent with lymphedema noted, lymph nods enlargement noted  Venous duplex was also obtained which was unable to rule out DVT because of significant weight.  But it was able to see enlarged lymph nodes in the groin.     Continue local wound care per wound care instructions. Needs to continue to follow-up with vascular surgery as outpatient for lymphedema pump, medical grade compression stockings.  Wound care to follow-up Recent Labs  Lab 09/04/23 1848 09/04/23 2055 09/05/23 0330 09/08/23 0413 09/10/23 0354  WBC 14.9*  --  15.7* 13.4* 12.8*  LATICACIDVEN  --  1.3  --   --   --    H/o CHF, HTN Per last cardiology note, echocardiogram could not assess LV function because of morbid obesity.  PTA meds- Coreg  25 mg twice daily, Lasix  80 mg twice daily, Aldactone  25 mg daily, Farxiga  10 mg daily hydralazine  50 mg twice daily  Currently continued on home doses of Coreg , Lasix , Aldactone  and Farxiga . Hydralazine  also has been resumed, blood pressure uptrending -Monitoring closely Continue to monitor IV hydralazine  as needed.   Intake/Output Summary (Last 24 hours) at 09/10/2023 1232 Last data filed at 09/10/2023 1109 Gross per 24 hour  Intake 1347.38 ml  Output 5000 ml  Net -3652.62 ml      Hyponatremia Sodium level running low likely related to diuresis  Recent Labs  Lab 09/04/23 1848 09/05/23 0330 09/08/23 0413 09/10/23 0354 09/10/23 0833  NA 134* 133* 132* 133* 135   Type 2 diabetes mellitus A1c 6.3 on 09/05/2023 PTA meds-Farxiga  Currently continued on Farxiga  and SSI/Accu-Cheks Recent Labs  Lab 09/09/23 1204  09/09/23 1628 09/09/23 2159 09/10/23 0747 09/10/23 1218  GLUCAP 122* 123* 176* 111* 142*   Morbid Obesity  Body mass index is 64.31 kg/m. Patient has been advised to make an attempt to improve diet and exercise patterns to aid in weight loss. Patient states he is in the pr next ocess of starting Ozempic  Encouraged patient to follow-up closely-with approximately 20% weight loss over a year  OSA/OHS On nocturnal CPAP  H/o remote DVT Previously on Coumadin.  Currently not on anticoagulation Lovenox  subcu for DVT prophylaxis  H/o polycythemia  Chronic anemia He was seen by Dr. Autumn recently as an outpatient.  Patient previously had polycythemia thought to be a physiological response to hypoxia d/t OSA.  Noted a plan is to maintain hematocrit below 55 with intermittent therapeutic phlebotomy.  Lately he has anemia.  No active bleeding.  Continue to monitor Hemoglobin trend as below.  Remains above 10. Recent Labs    03/24/23 1509 04/21/23 1449 08/19/23 1443 09/04/23 1848 09/05/23 0330 09/08/23 0413 09/10/23 0354  HGB 12.4* 12.3* 12.5* 11.3* 10.6* 10.4* 10.7*  MCV 78.6* 79.5* 80.0 79.0* 80.7 80.8 81.5  FERRITIN 83 105 211  --   --   --   --   TIBC 350 328 294  --   --   --   --   IRON 40* 32* 36*  --   --   --   --     Chronic pain diabetic peripheral neuropathy Currently on Oxy, gabapentin    Mobility: Encourage ambulation. Patient reports he has been on putting weight on the right lower extremity. Seen by PT yesterday.    Bariatric rolling walker was advised.  Goals of care   Code Status: Full Code     DVT prophylaxis:  SCDs Start: 09/05/23 0035 Ordered for Lovenox  subcu   Antimicrobials: IV Rocephin , IV Zyvox  Fluid: None Consultants: Orthopedics, vascular surgery Family Communication: Wife available on the phone  Status: Inpatient Level of care:  Med-Surg   Patient is from: Home Needs to continue in-hospital care: Needs IV antibiotics and further  monitoring.  Pending CT scan right lower extremity. Anticipated d/c to: Pending clinical course--dissipating discharge in a.m. on oral antibiotics Containing IV diuretics today   Diet:  Diet Order             Diet Carb Modified Fluid consistency: Thin; Room service appropriate? Yes  Diet effective now                   Scheduled Meds:  carvedilol   25 mg Oral BID   dapagliflozin  propanediol  10 mg Oral QAC breakfast   enoxaparin  (LOVENOX ) injection  110 mg Subcutaneous Q24H   furosemide   80 mg Oral BID   gabapentin   300 mg Oral TID   hydrALAZINE   50 mg Oral BID   insulin aspart  0-15 Units Subcutaneous TID WC   insulin aspart  0-5 Units Subcutaneous QHS   sodium chloride  flush  3 mL Intravenous Q12H   spironolactone   25 mg Oral Daily    PRN meds: acetaminophen  **OR** acetaminophen , HYDROmorphone (DILAUDID) injection, melatonin, naLOXone (NARCAN)  injection, ondansetron  (ZOFRAN ) IV, mouth rinse, oxyCODONE   Infusions:   cefTRIAXone  (ROCEPHIN )  IV 1 g (  09/10/23 1109)   linezolid  (ZYVOX ) IV 600 mg (09/10/23 0944)    Antimicrobials: Anti-infectives (From admission, onward)    Start     Dose/Rate Route Frequency Ordered Stop   09/05/23 1100  cefTRIAXone  (ROCEPHIN ) 1 g in sodium chloride  0.9 % 100 mL IVPB        1 g 200 mL/hr over 30 Minutes Intravenous Every 24 hours 09/05/23 0041     09/05/23 1000  linezolid  (ZYVOX ) IVPB 600 mg        600 mg 300 mL/hr over 60 Minutes Intravenous Every 12 hours 09/05/23 0829     09/04/23 2215  vancomycin  (VANCOCIN ) IVPB 1000 mg/200 mL premix  Status:  Discontinued       Placed in Followed by Linked Group   1,000 mg 200 mL/hr over 60 Minutes Intravenous  Once 09/04/23 2113 09/04/23 2114   09/04/23 2130  linezolid  (ZYVOX ) IVPB 600 mg        600 mg 300 mL/hr over 60 Minutes Intravenous  Once 09/04/23 2123 09/05/23 1118   09/04/23 2115  vancomycin  (VANCOCIN ) IVPB 1000 mg/200 mL premix  Status:  Discontinued       Placed in Followed  by Linked Group   1,000 mg 200 mL/hr over 60 Minutes Intravenous  Once 09/04/23 2113 09/04/23 2114   09/04/23 2100  vancomycin  (VANCOCIN ) IVPB 1000 mg/200 mL premix  Status:  Discontinued        1,000 mg 200 mL/hr over 60 Minutes Intravenous  Once 09/04/23 2057 09/04/23 2113   09/04/23 2100  cefTRIAXone  (ROCEPHIN ) 2 g in sodium chloride  0.9 % 100 mL IVPB        2 g 200 mL/hr over 30 Minutes Intravenous  Once 09/04/23 2057 09/04/23 2209       Objective: Vitals:   09/09/23 2200 09/10/23 0533  BP: (!) 147/83 (!) 150/95  Pulse: 100 81  Resp: 16 16  Temp: 100.2 F (37.9 C) 98 F (36.7 C)  SpO2: 95% 92%    Intake/Output Summary (Last 24 hours) at 09/10/2023 1229 Last data filed at 09/10/2023 1109 Gross per 24 hour  Intake 1347.38 ml  Output 5000 ml  Net -3652.62 ml   Filed Weights   09/04/23 1810 09/05/23 0001 09/10/23 0500  Weight: (!) 230.2 kg (!) 227.7 kg (!) 227.2 kg   Weight change:  Body mass index is 64.31 kg/m.   Physical Exam:      General:  AAO x 3,  cooperative, no distress; morbidly obese male  HEENT:  Normocephalic, PERRL, otherwise with in Normal limits   Neuro:  CNII-XII intact. , normal motor and sensation, reflexes intact   Lungs:   Clear to auscultation BL, Respirations unlabored,  No wheezes / crackles  Cardio:    S1/S2, RRR, No murmure, No Rubs or Gallops   Abdomen:  Soft, non-tender, bowel sounds active all four quadrants, no guarding or peritoneal signs.  Muscular  skeletal:  Limited exam -global generalized weaknesses - in bed, able to move all 4 extremities,   2+ pulses,  symmetric,  Has bilateral lower EXTR lymphedema R>L, right lower extremity still has swelling to thigh, with redness, tenderness and warmth.  Per patient right lower extremity swelling looking better, improving  Skin:  Dry, warm to touch, negative for any Rashes,  Wounds: Please see nursing documentation         Data Review: I have personally reviewed the laboratory  data and studies available.  F/u labs ordered Unresulted Labs (From admission, onward)  Start     Ordered   09/11/23 0500  CBC  Daily,   R     Question:  Specimen collection method  Answer:  Lab=Lab collect   09/10/23 0751   09/10/23 0752  Basic metabolic panel with GFR  Every 48 hours,   R     Question:  Specimen collection method  Answer:  Lab=Lab collect   09/10/23 0751            Signed, Adriana DELENA Grams, MD [facility] 09/10/2023

## 2023-09-10 NOTE — TOC Progression Note (Signed)
 Transition of Care [facility]) - Progression Note    Patient Details  Name: [name] MRN: [id number] Date of Birth: 06-01-1980  Transition of Care [facility]) CM/SW Contact  Alfonse JONELLE Rex, RN Phone Number: 09/10/2023, 10:44 AM  Clinical Narrative:    Remains on iv abx, monitoring, not medically ready for  dc. TOC will continue to follow.     Expected Discharge Plan: Home w Home Health Services Barriers to Discharge: Continued Medical Work up  Expected Discharge Plan and Services       Living arrangements for the past 2 months: Single Family Home                                       Social Determinants of Health (SDOH) Interventions SDOH Screenings   Food Insecurity: No Food Insecurity (09/05/2023)  Housing: Low Risk  (09/05/2023)  Transportation Needs: No Transportation Needs (09/05/2023)  Utilities: Not At Risk (09/05/2023)  Alcohol Screen: Low Risk  (04/03/2023)  Depression (PHQ2-9): Low Risk  (04/03/2023)  Financial Resource Strain: Low Risk  (04/03/2023)  Physical Activity: Sufficiently Active (04/03/2023)  Social Connections: Socially Integrated (09/05/2023)  Stress: No Stress Concern Present (04/03/2023)  Tobacco Use: Low Risk  (09/04/2023)    Readmission Risk Interventions    09/05/2023    1:20 PM  Readmission Risk Prevention Plan  Post Dischage Appt Complete  Medication Screening Complete  Transportation Screening Complete

## 2023-09-10 NOTE — Progress Notes (Signed)
 Physical Therapy Treatment Patient Details Name: [name] MRN: [id number] DOB: 25-Aug-1980 Today's Date: 09/10/2023   History of Present Illness 43 y.o. male with RLE cellulitis and venous stasis ulcers, bil LE lymphedema. doppler RLE ltd d/t body habitus. unable to do MRI.  PMH : significant for morbid obesity, chronic venous insufficiency, chronic lower extremity lymphedema R>L with stasis ulcers, h/o DVT previously on Coumadin, OSA on CPAP, CHF, anemia, chronic pain,diabetic neuropathy    PT Comments  Pt OOB in recliner.  Used urinal seated before walk.  Pt self able to rise from recliner.  General Gait Details: self able with walker with decreased weight shift to his right and wide base of support.  Heavy lean on walker but Pt stated don't need one when I asked if he had one at home. Returned to room back to recliner.  Pt requesting something for pain.  Reported to RN. Pt plans to return home.  LPT has rec NO post acute PT services.  Pt declines the need to order a walker.   If plan is discharge home, recommend the following:     Can travel by private vehicle        Equipment Recommendations  Other (comment) (pt declines)    Recommendations for Other Services       Precautions / Restrictions Precautions Precautions: Fall     Mobility  Bed Mobility               General bed mobility comments: OOB in recliner    Transfers Overall transfer level: Modified independent                 General transfer comment: self able    Ambulation/Gait Ambulation/Gait assistance: Modified independent (Device/Increase time) Gait Distance (Feet): 45 Feet Assistive device: Rolling walker (2 wheels) Gait Pattern/deviations: Step-to pattern, Wide base of support Gait velocity: decreased     General Gait Details: self able with walker with decreased weight shift to his right and wide base of support.  Heavy lean on walker but Pt stated don't need one when I asked if he  had one at home.   Stairs Stairs:  (no stairs at home)           Wheelchair Mobility     Tilt Bed    Modified Rankin (Stroke Patients Only)       Balance                                            Communication Communication Communication: No apparent difficulties  Cognition Arousal: Alert Behavior During Therapy: WFL for tasks assessed/performed   PT - Cognitive impairments: No apparent impairments                       PT - Cognition Comments: AxO x 3 slow moving Following commands: Intact      Cueing Cueing Techniques: Verbal cues  Exercises      General Comments        Pertinent Vitals/Pain Pain Assessment Pain Assessment: Faces Faces Pain Scale: Hurts even more Pain Location: right knee/thigh with activity.movement Pain Descriptors / Indicators: Sore, Aching Pain Intervention(s): Monitored during session, Repositioned, Patient requesting pain meds-RN notified    Home Living  Prior Function            PT Goals (current goals can now be found in the care plan section) Progress towards PT goals: Progressing toward goals    Frequency    Min 2X/week      PT Plan      Co-evaluation              AM-PAC PT 6 Clicks Mobility   Outcome Measure  Help needed turning from your back to your side while in a flat bed without using bedrails?: None Help needed moving from lying on your back to sitting on the side of a flat bed without using bedrails?: None Help needed moving to and from a bed to a chair (including a wheelchair)?: None Help needed standing up from a chair using your arms (e.g., wheelchair or bedside chair)?: None Help needed to walk in hospital room?: None Help needed climbing 3-5 steps with a railing? : A Little 6 Click Score: 23    End of Session Equipment Utilized During Treatment: Gait belt Activity Tolerance: Patient tolerated treatment well Patient  left: in chair;with call bell/phone within reach;with chair alarm set Nurse Communication: Mobility status;Patient requests pain meds PT Visit Diagnosis: Other abnormalities of gait and mobility (R26.89)     Time: 8379-8364 PT Time Calculation (min) (ACUTE ONLY): 15 min  Charges:    $Gait Training: 8-22 mins PT General Charges $$ ACUTE PT VISIT: 1 Visit                    Katheryn Leap  PTA Acute  Rehabilitation Services Office M-F          [phone]

## 2023-09-11 ENCOUNTER — Other Ambulatory Visit (HOSPITAL_COMMUNITY): Payer: Self-pay

## 2023-09-11 ENCOUNTER — Telehealth (HOSPITAL_COMMUNITY): Payer: Self-pay | Admitting: Pharmacy Technician

## 2023-09-11 DIAGNOSIS — L03115 Cellulitis of right lower limb: Secondary | ICD-10-CM | POA: Diagnosis not present

## 2023-09-11 LAB — CBC
HCT: 35.2 % — ABNORMAL LOW (ref 39.0–52.0)
Hemoglobin: 10.8 g/dL — ABNORMAL LOW (ref 13.0–17.0)
MCH: 24.9 pg — ABNORMAL LOW (ref 26.0–34.0)
MCHC: 30.7 g/dL (ref 30.0–36.0)
MCV: 81.1 fL (ref 80.0–100.0)
Platelets: 306 10*3/uL (ref 150–400)
RBC: 4.34 MIL/uL (ref 4.22–5.81)
RDW: 16.5 % — ABNORMAL HIGH (ref 11.5–15.5)
WBC: 11.2 10*3/uL — ABNORMAL HIGH (ref 4.0–10.5)
nRBC: 0 % (ref 0.0–0.2)

## 2023-09-11 LAB — GLUCOSE, CAPILLARY
Glucose-Capillary: 115 mg/dL — ABNORMAL HIGH (ref 70–99)
Glucose-Capillary: 147 mg/dL — ABNORMAL HIGH (ref 70–99)

## 2023-09-11 MED ORDER — TRAMADOL HCL 50 MG PO TABS: 50.0000 mg | ORAL_TABLET | Freq: Four times a day (QID) | ORAL | 0 refills | Status: AC | PRN

## 2023-09-11 MED ORDER — LACTINEX PO CHEW
1.0000 | CHEWABLE_TABLET | Freq: Three times a day (TID) | ORAL | 0 refills | Status: AC
Start: 2023-09-11 — End: 2023-09-21
  Filled 2023-09-11: qty 30, 10d supply, fill #0

## 2023-09-11 MED ORDER — CLINDAMYCIN HCL 300 MG PO CAPS
300.0000 mg | ORAL_CAPSULE | Freq: Three times a day (TID) | ORAL | 0 refills | Status: AC
  Filled 2023-09-11: qty 21, 7d supply, fill #0

## 2023-09-11 NOTE — Hospital Course (Signed)
 Wound care  Every other day     Comments: Cleanse R lower leg wound with Vashe wound cleanser Soila [phone]) do not rinse and allow to air dry.  Apply silver hydrofiber ([place] [phone]) to wound bed every other day, cover with ABD pad and secure with Kerlix roll gauze beginning right above toes and ending right below knee.  Cover with Ace bandage wrapped in same fashion as Kerlix for light compression

## 2023-09-11 NOTE — Plan of Care (Signed)
 Patient discharged home via private vehicle with mother. AVS and discharge instruction provided to patient. Antibiotic was delivered to patient's room. Patient denies any questions/concerns and verbalizes understanding of education. Jon LULLA Reins, RN 09/11/23 2:06 PM

## 2023-09-11 NOTE — Plan of Care (Signed)
  Problem: Clinical Measurements: Goal: Ability to maintain clinical measurements within normal limits will improve Outcome: Adequate for Discharge Goal: Will remain free from infection Outcome: Progressing Goal: Diagnostic test results will improve Outcome: Adequate for Discharge Goal: Respiratory complications will improve Outcome: Adequate for Discharge   Problem: Activity: Goal: Risk for activity intolerance will decrease Outcome: Progressing   Problem: Coping: Goal: Level of anxiety will decrease Outcome: Adequate for Discharge   Problem: Pain Managment: Goal: General experience of comfort will improve and/or be controlled Outcome: Progressing   Problem: Safety: Goal: Ability to remain free from injury will improve Outcome: Adequate for Discharge   Problem: Metabolic: Goal: Ability to maintain appropriate glucose levels will improve Outcome: Adequate for Discharge   Problem: Nutritional: Goal: Maintenance of adequate nutrition will improve Outcome: Adequate for Discharge Goal: Progress toward achieving an optimal weight will improve Outcome: Progressing   Problem: Skin Integrity: Goal: Risk for impaired skin integrity will decrease Outcome: Progressing   Problem: Tissue Perfusion: Goal: Adequacy of tissue perfusion will improve Outcome: Progressing

## 2023-09-11 NOTE — Plan of Care (Signed)
 Problem: Clinical Measurements: Goal: Ability to maintain clinical measurements within normal limits will improve Outcome: Progressing   Problem: Coping: Goal: Level of anxiety will decrease Outcome: Progressing   Problem: Pain Managment: Goal: General experience of comfort will improve and/or be controlled Outcome: Progressing   Problem: Safety: Goal: Ability to remain free from injury will improve Outcome: Progressing   Problem: Skin Integrity: Goal: Risk for impaired skin integrity will decrease Outcome: Progressing   Jon LULLA Reins, RN 09/11/23 12:09 PM

## 2023-09-11 NOTE — Telephone Encounter (Signed)
 Patient Product/process development scientist completed.    The patient is insured through [facility]. Patient has [employer], may use a copay card, and/or apply for patient assistance if available.    Ran test claim for linezolid  600 mg and the current 10 day co-pay is $15.00.   This test claim was processed through [facility]- copay amounts may vary at other pharmacies due to pharmacy/plan contracts, or as the patient moves through the different stages of their insurance plan.     Reyes Sharps, CPHT Pharmacy Technician III Certified Patient Advocate [facility] Patient Advocate Team Direct Number: [phone]  Fax: [phone]

## 2023-09-11 NOTE — Discharge Summary (Signed)
 Physician Discharge Summary   Patient: [name] MRN: [id number] DOB: 01-Dec-1980  Admit date:     09/04/2023  Discharge date: 09/11/23  Discharge Physician: Adriana DELENA Grams   PCP: Tanda Bleacher, MD   Recommendations at discharge:   Follow the PCP in 1-2 weeks Continue wound care per instructions Continue current recommended medication including antibiotics, diuretics Wound care  Every other day     Comments: Cleanse R lower leg wound with Vashe wound cleanser Soila [phone]) do not rinse and allow to air dry.  Apply silver hydrofiber (Lawson [phone]) to wound bed every other day, cover with ABD pad and secure with Kerlix roll gauze beginning right above toes and ending right below knee.  Cover with Ace bandage wrapped in same fashion as Kerlix for light compression    Discharge Diagnoses: Principal Problem:   Cellulitis Hospital Course:  [name] is a 43 y.o. male with PMH significant for morbid obesity, chronic venous insufficiency, chronic lower extremity lymphedema R>L with stasis ulcers, h/o DVT previously on Coumadin, OSA on CPAP, CHF, anemia, chronic pain,diabetic neuropathy Patient follows up with wound care center for lower extremities wound. Per outpatient vascular note patient had also undergone multiple greater saphenous vein procedures in the past.     6/26, patient presented to the ED with complaint of rapidly worsening swelling, redness, pain of right lower extremity.  Also the pre-existing right leg ulcer opened up.   In the ED, patient was afebrile, hemodynamically stable, WBC count 14.9, lactic acid 1.3 On exam, had gross swelling of the right lower extremity with cellulitis up to the right thigh.  Right leg wound had granulation tissue, no purulent discharge Admitted to [facility] Orthopedic and vascular surgery consulted       Acute cellulitis of right lower extremity Bilateral lower extremity lymphedema R>L Right leg venous stasis ulcers   - Per  patient swelling and discomfort has significantly improved    Patient has chronic bilateral lower EXTR lymphedema related to his age, multiple peripheral vascular interventions and also history of right leg DVT several years ago, previously on Coumadin.   POA:  rapidly worsening swelling, redness, warmth of right lower extremity  Started on IV antibiotics-IV Rocephin  and Zyvox >>> no growth on cultures switch to p.o. clindamycin   Was seen orthopedics and vascular surgery.   - MRI right lower extremity- was canceled because of his weight.  -CT of right lower extremity was reviewed in detail, no signs of abscess or deep infection Changes consistent with lymphedema noted, lymph nods enlargement noted   Venous duplex was also obtained which was unable to rule out DVT because of significant weight.  But it was able to see enlarged lymph nodes in the groin.       Continue local wound care per wound care instructions. Needs to continue to follow-up with vascular surgery as outpatient for lymphedema pump, medical grade compression stockings.   Wound care to follow-up Last Labs         Recent Labs  Lab 09/04/23 1848 09/04/23 2055 09/05/23 0330 09/08/23 0413 09/10/23 0354  WBC 14.9*  --  15.7* 13.4* 12.8*  LATICACIDVEN  --  1.3  --   --   --       H/o CHF, HTN Per last cardiology note, echocardiogram could not assess LV function because of morbid obesity.  PTA meds- Coreg  25 mg twice daily, Lasix  80 mg twice daily, Aldactone  25 mg daily, Farxiga  10 mg daily hydralazine  50 mg  twice daily   Currently continued on home doses of Coreg , Lasix , Aldactone  and Farxiga . Hydralazine  - Closely monitor his daily weight, - The Lasix  switched to p.o.--continue with home dose of 80 mg p.o. twice daily     Intake/Output Summary (Last 24 hours) at 09/10/2023 1232 Last data filed at 09/10/2023 1109    Gross per 24 hour  Intake 1347.38 ml  Output 5000 ml  Net -3652.62 ml        Hyponatremia Sodium level running low likely related to diuresis Last Labs         Recent Labs  Lab 09/04/23 1848 09/05/23 0330 09/08/23 0413 09/10/23 0354 09/10/23 0833  NA 134* 133* 132* 133* 135      Type 2 diabetes mellitus A1c 6.3 on 09/05/2023 PTA meds-Farxiga  Currently continued on Farxiga  and SSI/Accu-Cheks Last Labs         Recent Labs  Lab 09/09/23 1204 09/09/23 1628 09/09/23 2159 09/10/23 0747 09/10/23 1218  GLUCAP 122* 123* 176* 111* 142*      Morbid Obesity  Body mass index is 64.31 kg/m. Patient has been advised to make an attempt to improve diet and exercise patterns to aid in weight loss. Patient states he is in the pr next ocess of starting Ozempic  Encouraged patient to follow-up closely-with approximately 20% weight loss over a year   OSA/OHS On nocturnal CPAP   H/o remote DVT Previously on Coumadin.  Currently not on anticoagulation Lovenox  subcu for DVT prophylaxis   H/o polycythemia  Chronic anemia He was seen by Dr. Autumn recently as an outpatient.  Patient previously had polycythemia thought to be a physiological response to hypoxia d/t OSA.  Noted a plan is to maintain hematocrit below 55 with intermittent therapeutic phlebotomy.  Lately he has anemia.  No active bleeding.  Continue to monitor Hemoglobin trend as below.  Remains above 10. Recent Labs (within last 365 days)           Recent Labs    03/24/23 1509 04/21/23 1449 08/19/23 1443 09/04/23 1848 09/05/23 0330 09/08/23 0413 09/10/23 0354  HGB 12.4* 12.3* 12.5* 11.3* 10.6* 10.4* 10.7*  MCV 78.6* 79.5* 80.0 79.0* 80.7 80.8 81.5  FERRITIN 83 105 211  --   --   --   --   TIBC 350 328 294  --   --   --   --   IRON 40* 32* 36*  --   --   --   --       Chronic pain diabetic peripheral neuropathy Currently on Oxy, gabapentin    Mobility: Encourage ambulation. Patient reports he has been on putting weight on the right lower extremity. Seen by PT yesterday.    Bariatric rolling  walker was advised.   Goals of care   Code Status: Full Code         Consultants: Orthopedic/vascular surgery  Procedures performed: Lower extremity Doppler studies, CTA of lower extremities Disposition: Home Diet recommendation:  Discharge Diet Orders (From admission, onward)     Start     Ordered   09/11/23 0000  Diet - low sodium heart healthy        09/11/23 0854           Cardiac and Carb modified diet DISCHARGE MEDICATION: Allergies as of 09/11/2023       Reactions   Entresto  [sacubitril -valsartan ] Swelling, Other (See Comments)   Tongue swelling   Lidocaine  Hives, Shortness Of Breath, Other (See Comments)   burns my skin also-  Patient ended up in the ED   Other Anaphylaxis, Swelling, Other (See Comments)   Tree nuts cause swelling   Peanut-containing Drug Products Anaphylaxis, Swelling   Vancomycin  Anaphylaxis   From careeverywhere records ([facility])   Hydrofera Blue 4x4 [wound Dressings] Nausea Only, Other (See Comments)   Made the patient light-headed and caused sweating also   Latex Hives, Other (See Comments)        Medication List     TAKE these medications    Accu-Chek Guide Me w/Device Kit USE UP TO 4 TIMES A DAY AS DIRECTED   Accu-Chek Guide test strip Generic drug: glucose blood Use as instructed   Accu-Chek Softclix Lancets lancets Use as instructed   carvedilol  25 MG tablet Commonly known as: COREG  TAKE 1 TABLET BY MOUTH TWICE A DAY   clindamycin 300 MG capsule Commonly known as: CLEOCIN Take 1 capsule (300 mg total) by mouth 3 (three) times daily for 7 days.   dapagliflozin  propanediol 10 MG Tabs tablet Commonly known as: Farxiga  Take 1 tablet (10 mg total) by mouth daily before breakfast.   EPINEPHrine  0.3 mg/0.3 mL Soaj injection Commonly known as: EPI-PEN Inject 0.3 mg into the muscle as needed for anaphylaxis.   furosemide  80 MG tablet Commonly known as: LASIX  Take 1 tablet (80 mg total) by mouth 2 (two) times  daily.   gabapentin  300 MG capsule Commonly known as: NEURONTIN  Take 1 capsule (300 mg total) by mouth 3 (three) times daily.   hydrALAZINE  50 MG tablet Commonly known as: APRESOLINE  TAKE 1 TABLET (50 MG) BY MOUTH IN THE MORNING AND AT BEDTIME What changed: See the new instructions.   lactobacillus acidophilus & bulgar chewable tablet Chew 1 tablet by mouth 3 (three) times daily with meals for 10 days.   Semaglutide  (2 MG/DOSE) 8 MG/3ML Sopn Inject 2 mg as directed once a week. What changed: how much to take   spironolactone  25 MG tablet Commonly known as: ALDACTONE  Take 1 tablet (25 mg total) by mouth daily.   traMADol  50 MG tablet Commonly known as: ULTRAM  Take 1 tablet (50 mg total) by mouth every 6 (six) hours as needed for up to 3 days for moderate pain (pain score 4-6).               Discharge Care Instructions  (From admission, onward)           Start     Ordered   09/11/23 0000  Discharge wound care:       Comments: Wound care  Every other day    Comments: Cleanse R lower leg wound with Vashe wound cleanser Soila [phone]) do not rinse and allow to air dry.  Apply silver hydrofiber (Lawson [phone]) to wound bed every other day, cover with ABD pad and secure with Kerlix roll gauze beginning right above toes and ending right below knee.  Cover with Ace bandage wrapped in same fashion as Kerlix for light compression.   09/11/23 0854   09/11/23 0000  Discharge wound care:       Comments: Wound care  Every other day    Comments: Cleanse R lower leg wound with Vashe wound cleanser Soila [phone]) do not rinse and allow to air dry.  Apply silver hydrofiber (Lawson [phone]) to wound bed every other day, cover with ABD pad and secure with Kerlix roll gauze beginning right above toes and ending right below knee.  Cover with Ace bandage wrapped in same fashion as Kerlix for light compression  09/11/23 1146            Discharge Exam: Filed Weights   09/04/23  1810 09/05/23 0001 09/10/23 0500  Weight: (!) 230.2 kg (!) 227.7 kg (!) 227.2 kg        General:  AAO x 3,  cooperative, no distress;   HEENT:  Normocephalic, PERRL, otherwise with in Normal limits   Neuro:  CNII-XII intact. , normal motor and sensation, reflexes intact   Lungs:   Clear to auscultation BL, Respirations unlabored,  No wheezes / crackles  Cardio:    S1/S2, RRR, No murmure, No Rubs or Gallops   Abdomen:  Soft, non-tender, bowel sounds active all four quadrants, no guarding or peritoneal signs.  Muscular  skeletal:  Limited exam -global generalized weaknesses - in bed, able to move all 4 extremities,   2+ pulses,  symmetric, +2 edema   Skin:  Dry, warm to touch,  Wounds: Bilateral lower extremity-please see pictures below    Chronic edema of the legs with expected skin changes.  See pictures below regarding right lower extremity wound        Condition at discharge: good  The results of significant diagnostics from this hospitalization (including imaging, microbiology, ancillary and laboratory) are listed below for reference.   Imaging Studies: CT FEMUR RIGHT W CONTRAST Result Date: 09/09/2023 CLINICAL DATA:  43 year old male with history of chronic bilateral lower extremity lymphedema and multiple prior peripheral vascular interventions. Presenting with continued pain and swelling of the right thigh with concern for cellulitis. EXAM: CT OF THE LOWER RIGHT EXTREMITY WITH CONTRAST TECHNIQUE: Multidetector CT imaging of the lower right extremity was performed according to the standard protocol following intravenous contrast administration. RADIATION DOSE REDUCTION: This exam was performed according to the departmental dose-optimization program which includes automated exposure control, adjustment of the mA and/or kV according to patient size and/or use of iterative reconstruction technique. CONTRAST:  OMNIPAQUE  IOHEXOL  300 MG/ML  SOLN COMPARISON:  Right tibia and  fibula radiographs dated 12/26/2015. CT abdomen/pelvis dated 01/07/2018. FINDINGS: Bones/Joint/Cartilage No acute fracture or dislocation. No evidence of osteolysis. Mild joint space narrowing of the right hip. Severe medial femorotibial compartment joint space narrowing resulting in bone-on-bone contact, subchondral sclerosis, and marginal osteophytosis. Mild to moderate lateral femorotibial and patellofemoral compartment joint space narrowing with subchondral cystic changes and marginal osteophytosis. No joint effusion. Ligaments Ligaments are suboptimally evaluated by CT. Muscles and Tendons Mild fatty atrophy of the distal sartorius muscle. Fatty atrophy of the mid to distal medial head of the gastrocnemius muscle. No discrete intramuscular collection. Soft tissue Generalized diffuse subcutaneous edema and stranding extending from the mid thigh through the ankle with cutaneous thickening. Cutaneous irregularity extending along the medial and posteromedial right lower calf likely corresponds to ulceration. No soft tissue gas. No discrete loculated fluid collection. Innumerable small diffuse circumferential calcifications are again noted throughout the soft tissues of the mid to distal right calf, which could be secondary to dermatomyositis or prior insult. Less pronounced subcutaneous edema of the proximal to mid right thigh. Numerous chronic venous collaterals and varicosities extending throughout the right lower extremity. Increased size of bulky right external iliac and inguinal lymph nodes including a 2.6 cm short axis right external iliac lymph node and a 4.9 cm short axis right inguinal lymph node (series 12, images 40 and 176). Redemonstrated right inguinal postoperative changes with numerous surgical clips. IMPRESSION: 1. Generalized diffuse nonspecific subcutaneous edema and stranding extending from the mid thigh through the ankle with cutaneous  thickening, as can be seen with chronic lymphedema,  however, superimposed cellulitis cannot be excluded. No soft tissue gas or loculated fluid collection. Cutaneous irregularity extending along the medial and posteromedial right lower calf likely corresponds to ulceration. 2. Increased size of bulky enlarged right external iliac and inguinal lymph nodes, including a 2.6 cm short axis right external iliac lymph node and a 4.9 cm short axis right inguinal lymph node. These findings are nonspecific and favored reactive. 3. Chronic venous collaterals and varicosities extending throughout the right lower extremity. 4. Chronic innumerable small diffuse calcifications throughout the subcutaneous tissues of the mid to distal right calf, which could be secondary to dermatomyositis or prior insult. 5. Tricompartmental osteoarthritis of the right knee with severe medial compartment joint space narrowing resulting in bone-on-bone contact. Electronically Signed   By: Harrietta Sherry M.D.   On: 09/09/2023 16:11   CT TIBIA FIBULA RIGHT W CONTRAST Result Date: 09/09/2023 CLINICAL DATA:  43 year old male with history of chronic bilateral lower extremity lymphedema and multiple prior peripheral vascular interventions. Presenting with continued pain and swelling of the right thigh with concern for cellulitis. EXAM: CT OF THE LOWER RIGHT EXTREMITY WITH CONTRAST TECHNIQUE: Multidetector CT imaging of the lower right extremity was performed according to the standard protocol following intravenous contrast administration. RADIATION DOSE REDUCTION: This exam was performed according to the departmental dose-optimization program which includes automated exposure control, adjustment of the mA and/or kV according to patient size and/or use of iterative reconstruction technique. CONTRAST:  OMNIPAQUE  IOHEXOL  300 MG/ML  SOLN COMPARISON:  Right tibia and fibula radiographs dated 12/26/2015. CT abdomen/pelvis dated 01/07/2018. FINDINGS: Bones/Joint/Cartilage No acute fracture or  dislocation. No evidence of osteolysis. Mild joint space narrowing of the right hip. Severe medial femorotibial compartment joint space narrowing resulting in bone-on-bone contact, subchondral sclerosis, and marginal osteophytosis. Mild to moderate lateral femorotibial and patellofemoral compartment joint space narrowing with subchondral cystic changes and marginal osteophytosis. No joint effusion. Ligaments Ligaments are suboptimally evaluated by CT. Muscles and Tendons Mild fatty atrophy of the distal sartorius muscle. Fatty atrophy of the mid to distal medial head of the gastrocnemius muscle. No discrete intramuscular collection. Soft tissue Generalized diffuse subcutaneous edema and stranding extending from the mid thigh through the ankle with cutaneous thickening. Cutaneous irregularity extending along the medial and posteromedial right lower calf likely corresponds to ulceration. No soft tissue gas. No discrete loculated fluid collection. Innumerable small diffuse circumferential calcifications are again noted throughout the soft tissues of the mid to distal right calf, which could be secondary to dermatomyositis or prior insult. Less pronounced subcutaneous edema of the proximal to mid right thigh. Numerous chronic venous collaterals and varicosities extending throughout the right lower extremity. Increased size of bulky right external iliac and inguinal lymph nodes including a 2.6 cm short axis right external iliac lymph node and a 4.9 cm short axis right inguinal lymph node (series 12, images 40 and 176). Redemonstrated right inguinal postoperative changes with numerous surgical clips. IMPRESSION: 1. Generalized diffuse nonspecific subcutaneous edema and stranding extending from the mid thigh through the ankle with cutaneous thickening, as can be seen with chronic lymphedema, however, superimposed cellulitis cannot be excluded. No soft tissue gas or loculated fluid collection. Cutaneous irregularity  extending along the medial and posteromedial right lower calf likely corresponds to ulceration. 2. Increased size of bulky enlarged right external iliac and inguinal lymph nodes, including a 2.6 cm short axis right external iliac lymph node and a 4.9 cm short axis right inguinal  lymph node. These findings are nonspecific and favored reactive. 3. Chronic venous collaterals and varicosities extending throughout the right lower extremity. 4. Chronic innumerable small diffuse calcifications throughout the subcutaneous tissues of the mid to distal right calf, which could be secondary to dermatomyositis or prior insult. 5. Tricompartmental osteoarthritis of the right knee with severe medial compartment joint space narrowing resulting in bone-on-bone contact. Electronically Signed   By: Harrietta Sherry M.D.   On: 09/09/2023 16:11   VAS US  LOWER EXTREMITY VENOUS (DVT) Result Date: 09/07/2023  Lower Venous DVT Study Patient Name:  [name]  Date of Exam:   09/06/2023 Medical Rec #: [id number]      Accession #:    [id number] Date of Birth: 12-12-1980      Patient Gender: M Patient Age:   76 years Exam Location:  [facility] Procedure:      VAS US  LOWER EXTREMITY VENOUS (DVT) Referring Phys: ISAIAH LEVER --------------------------------------------------------------------------------  Indications: Edema, and Chronic venous stasis wound right distal calf. Other             Chronic venous insufficiency, chronic lower extremity Indications:      lymphedema. Limitations: Body habitus (BMI 64.45), poor ultrasound/tissue interface and Skin texture. Comparison Study: History of multiple limited/non diagnostic right LEVs,                   including 04/04/23, 11/02/20, 03/20/16, 12/19/15 Performing Technologist: Alberta Lis RVS  Examination Guidelines: A complete evaluation includes B-mode imaging, spectral Doppler, color Doppler, and power Doppler as needed of all accessible portions of each vessel. Bilateral testing  is considered an integral part of a complete examination. Limited examinations for reoccurring indications may be performed as noted. The reflux portion of the exam is performed with the patient in reverse Trendelenburg.  +---------+---------------+---------+-----------+----------+--------------+ RIGHT    CompressibilityPhasicitySpontaneityPropertiesThrombus Aging +---------+---------------+---------+-----------+----------+--------------+ CFV                                                   Not visualized +---------+---------------+---------+-----------+----------+--------------+ SFJ                                                   Not visualized +---------+---------------+---------+-----------+----------+--------------+ FV Prox                                               Not visualized +---------+---------------+---------+-----------+----------+--------------+ FV Mid                                                Not visualized +---------+---------------+---------+-----------+----------+--------------+ FV Distal                                             Not visualized +---------+---------------+---------+-----------+----------+--------------+ PFV  Not visualized +---------+---------------+---------+-----------+----------+--------------+ POP                                                   Not visualized +---------+---------------+---------+-----------+----------+--------------+ PTV                                                   Not visualized +---------+---------------+---------+-----------+----------+--------------+ PERO                                                  Not visualized +---------+---------------+---------+-----------+----------+--------------+   Left Technical Findings: Left leg not evaluated.   Summary: RIGHT: Non diagnostic exam secondary to body habitus and skin texture  - Ultrasound characteristics of enlarged lymph nodes are noted in the groin.   *See table(s) above for measurements and observations. Electronically signed by Norman Serve on 09/07/2023 at 8:51:53 AM.    Final     Microbiology: Results for orders placed or performed during the hospital encounter of 09/04/23  Culture, blood (routine x 2)     Status: None   Collection Time: 09/04/23  8:54 PM   Specimen: BLOOD  Result Value Ref Range Status   Specimen Description   Final    BLOOD Performed at [facility], [address] [place], [place] [address]    Special Requests   Final    NONE Performed at [facility], [address] [place], [place] [address]    Culture   Final    NO GROWTH 5 DAYS Performed at [facility], [address], [place], [place] [address]    Report Status 09/10/2023 FINAL  Final  Culture, blood (routine x 2)     Status: None   Collection Time: 09/04/23  9:29 PM   Specimen: BLOOD  Result Value Ref Range Status   Specimen Description   Final    BLOOD RIGHT ANTECUBITAL Performed at [facility], [address], [place], [place] [address]    Special Requests   Final    BOTTLES DRAWN AEROBIC AND ANAEROBIC Blood Culture adequate volume Performed at [facility], [address], [place], [place] [address]    Culture   Final    NO GROWTH 5 DAYS Performed at [facility], [address] [place], [place] [address]    Report Status 09/10/2023 FINAL  Final    Labs: CBC: Recent Labs  Lab 09/04/23 1848 09/05/23 0330 09/08/23 0413 09/10/23 0354 09/11/23 0354  WBC 14.9* 15.7* 13.4* 12.8* 11.2*  NEUTROABS  --  12.7* 10.2* 10.2*  --   HGB 11.3* 10.6* 10.4* 10.7* 10.8*  HCT 34.9* 34.7* 33.3* 34.3* 35.2*  MCV 79.0* 80.7 80.8 81.5 81.1  PLT 198 185 271 289 306   Basic Metabolic Panel: Recent Labs  Lab 09/04/23 1848 09/05/23 0330 09/08/23 0413 09/10/23 0354  09/10/23 0833  NA 134* 133* 132* 133* 135  K 3.2* 3.5 3.5 3.8 3.8  CL 96* 97* 94* 95* 95*  CO2 26 26 28 26 31   GLUCOSE 107* 134* 118* 119* 117*  BUN 13 12  11 12 12   CREATININE 1.08 0.97 0.89 0.88 0.85  CALCIUM 9.0 8.1* 8.3* 8.2* 8.8*  MG  --  2.0  --   --   --    Liver Function Tests: Recent Labs  Lab 09/04/23 1848 09/05/23 0330  AST 23 21  ALT 20 21  ALKPHOS 117 92  BILITOT 1.2 1.2  PROT 8.2* 7.4  ALBUMIN 3.5 2.6*   CBG: Recent Labs  Lab 09/10/23 1218 09/10/23 1740 09/10/23 2144 09/11/23 0718 09/11/23 1115  GLUCAP 142* 126* 126* 115* 147*    Discharge time spent: greater than 45 minutes.  Signed: Adriana DELENA Grams, MD [facility] 09/11/2023

## 2023-09-11 NOTE — TOC Transition Note (Addendum)
 Transition of Care [facility]) - Discharge Note   Patient Details  Name: [name] MRN: [id number] Date of Birth: [birth year]  [facility] CM/SW Contact:  Alfonse JONELLE Rex, RN Phone Number: 09/11/2023, 11:43 AM   Clinical Narrative:   MD order for [facility] RN for lower extremity wound care. Referral sent to [facility], [facility], [facility], [facility] and [facility]-all unable to accept. [facility]-review for acceptance. Met with patient at bedside to inform difficulty in finding a HH agency, patient states that's ok because he will have his wife, who is a Engineer, civil (consulting), perform his wound care, team notified. No further TOC needs identified.     Final next level of care: Home w Home Health Services Barriers to Discharge: Barriers Resolved   Patient Goals and CMS Choice Patient states their goals for this hospitalization and ongoing recovery are:: return home          Discharge Placement                       Discharge Plan and Services Additional resources added to the After Visit Summary for                                       Social Drivers of Health (SDOH) Interventions SDOH Screenings   Food Insecurity: No Food Insecurity (09/05/2023)  Housing: Low Risk  (09/05/2023)  Transportation Needs: No Transportation Needs (09/05/2023)  Utilities: Not At Risk (09/05/2023)  Alcohol Screen: Low Risk  (04/03/2023)  Depression (PHQ2-9): Low Risk  (04/03/2023)  Financial Resource Strain: Low Risk  (04/03/2023)  Physical Activity: Sufficiently Active (04/03/2023)  Social Connections: Socially Integrated (09/05/2023)  Stress: No Stress Concern Present (04/03/2023)  Tobacco Use: Low Risk  (09/04/2023)     Readmission Risk Interventions    09/11/2023   11:42 AM 09/05/2023    1:20 PM  Readmission Risk Prevention Plan  Post Dischage Appt Complete Complete  Medication Screening Complete Complete  Transportation Screening Complete Complete

## 2023-09-11 NOTE — Progress Notes (Signed)
 TOC med in a secure bag delivered to pt in room by this RN. Pt aware that acidophilus was not available and would need to be purchased  OTC

## 2023-09-15 ENCOUNTER — Telehealth: Payer: Self-pay

## 2023-09-15 NOTE — Telephone Encounter (Signed)
 From [facility] call:  FYI- : He said he was referred to urology office in [place] but his insurance has changed and they do not accept his insurance. he is requesting a referral to a [place] provider.    He sees you 09/17/2023.

## 2023-09-15 NOTE — Transitions of Care (Post Inpatient/ED Visit) (Signed)
   09/15/2023  Name: [name] MRN: [id number] DOB: April 15, 1980  Today's TOC FU Call Status: Today's TOC FU Call Status:: Successful TOC FU Call Completed TOC FU Call Complete Date: 09/15/23 Patient's Name and Date of Birth confirmed.  Transition Care Management Follow-up Telephone Call Date of Discharge: 09/11/23 Discharge Facility: [name] [facility]) Type of Discharge: Inpatient Admission Primary Inpatient Discharge Diagnosis:: cellulitis RLE How have you been since you were released from the hospital?: Better (He stated he is feeling better but his legs are still weak) Any questions or concerns?: Yes Patient Questions/Concerns:: He said he was referred to urology office in [place] but his insurance has changed and they do not accept his insurance.  he is requesting a referral to a [place] provider. Patient Questions/Concerns Addressed: Notified Provider of Patient Questions/Concerns  Items Reviewed: Did you receive and understand the discharge instructions provided?: Yes Medications obtained,verified, and reconciled?: Partial Review Completed Reason for Partial Mediation Review: He said he has all medications and did not have any questions about the med regime and did not need to review the med list. He also stated that he has a glucometer Any new allergies since your discharge?: No Dietary orders reviewed?: Yes Type of Diet Ordered:: heart healthy, low sodium diabetic Do you have support at home?: Yes People in Home [RPT]: spouse Name of Support/Comfort Primary Source: his wife  Medications Reviewed Today: Medications Reviewed Today   Medications were not reviewed in this encounter     Home Care and Equipment/Supplies: Were Home Health Services Ordered?: No Any new equipment or medical supplies ordered?: Yes Name of Medical supply agency?: he said he has all of the dressing supplies Were you able to get the equipment/medical supplies?: Yes Do you have any questions  related to the use of the equipment/supplies?: No (He said his wife is changing  the dressing to RLE ever other day.)  Functional Questionnaire: Do you need assistance with bathing/showering or dressing?: No Do you need assistance with meal preparation?: No Do you need assistance with eating?: No Do you have difficulty maintaining continence: No Do you need assistance with getting out of bed/getting out of a chair/moving?: Yes (He said he has a cane to use when needed) Do you have difficulty managing or taking your medications?: No  Follow up appointments reviewed: PCP Follow-up appointment confirmed?: Yes Date of PCP follow-up appointment?: 09/17/23 Follow-up Provider: Dr Tanda Do you need transportation to your follow-up appointment?: No Do you understand care options if your condition(s) worsen?: Yes-patient verbalized understanding    SIGNATURE  [name], RN

## 2023-09-17 ENCOUNTER — Ambulatory Visit (INDEPENDENT_AMBULATORY_CARE_PROVIDER_SITE_OTHER): Admitting: Family Medicine

## 2023-09-17 ENCOUNTER — Encounter: Payer: Self-pay | Admitting: Family Medicine

## 2023-09-17 VITALS — BP 120/77 | HR 91 | Ht 74.0 in | Wt >= 6400 oz

## 2023-09-17 DIAGNOSIS — Z09 Encounter for follow-up examination after completed treatment for conditions other than malignant neoplasm: Secondary | ICD-10-CM | POA: Diagnosis not present

## 2023-09-17 DIAGNOSIS — L03115 Cellulitis of right lower limb: Secondary | ICD-10-CM

## 2023-09-17 DIAGNOSIS — Z794 Long term (current) use of insulin: Secondary | ICD-10-CM

## 2023-09-17 DIAGNOSIS — I10 Essential (primary) hypertension: Secondary | ICD-10-CM | POA: Diagnosis not present

## 2023-09-17 DIAGNOSIS — Z0289 Encounter for other administrative examinations: Secondary | ICD-10-CM

## 2023-09-17 DIAGNOSIS — Z6841 Body Mass Index (BMI) 40.0 and over, adult: Secondary | ICD-10-CM

## 2023-09-17 DIAGNOSIS — L02415 Cutaneous abscess of right lower limb: Secondary | ICD-10-CM

## 2023-09-17 DIAGNOSIS — E66813 Obesity, class 3: Secondary | ICD-10-CM

## 2023-09-17 DIAGNOSIS — E11622 Type 2 diabetes mellitus with other skin ulcer: Secondary | ICD-10-CM

## 2023-09-17 NOTE — Progress Notes (Unsigned)
 Established Patient Office Visit  Subjective    Patient ID: [name], male    DOB: 12-Sep-1980  Age: 43 y.o. MRN: [id number]  CC:  Chief Complaint  Patient presents with   Medical Management of Chronic Issues    Pt reports was in hospital last week due to cellulitis.     HPI [name] presents for routine hospital discharge follow up where patient was admitted with cellulitis. Patient is completing outpatient course of antibiotics.   Outpatient Encounter Medications as of 09/17/2023  Medication Sig   Accu-Chek Softclix Lancets lancets Use as instructed   Blood Glucose Monitoring Suppl (ACCU-CHEK GUIDE ME) w/Device KIT USE UP TO 4 TIMES A DAY AS DIRECTED   carvedilol  (COREG ) 25 MG tablet TAKE 1 TABLET BY MOUTH TWICE A DAY   clindamycin  (CLEOCIN ) 300 MG capsule Take 1 capsule (300 mg total) by mouth 3 (three) times daily for 7 days.   dapagliflozin  propanediol (FARXIGA ) 10 MG TABS tablet Take 1 tablet (10 mg total) by mouth daily before breakfast.   EPINEPHrine  0.3 mg/0.3 mL IJ SOAJ injection Inject 0.3 mg into the muscle as needed for anaphylaxis.   furosemide  (LASIX ) 80 MG tablet Take 1 tablet (80 mg total) by mouth 2 (two) times daily.   gabapentin  (NEURONTIN ) 300 MG capsule Take 1 capsule (300 mg total) by mouth 3 (three) times daily.   glucose blood (ACCU-CHEK GUIDE) test strip Use as instructed   hydrALAZINE  (APRESOLINE ) 50 MG tablet TAKE 1 TABLET (50 MG) BY MOUTH IN THE MORNING AND AT BEDTIME (Patient taking differently: Take 50 mg by mouth in the morning and at bedtime.)   lactobacillus acidophilus & bulgar (LACTINEX) chewable tablet Chew 1 tablet by mouth 3 (three) times daily with meals for 10 days.   Semaglutide , 2 MG/DOSE, 8 MG/3ML SOPN Inject 2 mg as directed once a week. (Patient taking differently: Inject 8 mg as directed once a week.)   spironolactone  (ALDACTONE ) 25 MG tablet Take 1 tablet (25 mg total) by mouth daily.   No facility-administered encounter  medications on file as of 09/17/2023.    Past Medical History:  Diagnosis Date   Chronic venous insufficiency    a. as a premature infant, required venous access for care with subsequent DVT and some sort of procedure -> eventually went on to have vein bypass around 2005.   Hypertension    Lymphedema    Morbid obesity (HCC)    OSA on CPAP     Past Surgical History:  Procedure Laterality Date   balloon stenting Right    right leg   VEIN LIGATION AND STRIPPING      Family History  Problem Relation Age of Onset   Diabetes Mother    Cancer Father        colon   Cancer Maternal Grandmother    Cancer Maternal Grandfather     Social History   Socioeconomic History   Marital status: Single    Spouse name: Not on file   Number of children: Not on file   Years of education: Not on file   Highest education level: Master's degree (e.g., MA, MS, MEng, MEd, MSW, MBA)  Occupational History   Not on file  Tobacco Use   Smoking status: Never   Smokeless tobacco: Never  Substance and Sexual Activity   Alcohol use: Yes    Alcohol/week: 4.0 standard drinks of alcohol    Types: 4 Standard drinks or equivalent per week  Comment: liquor - pt states once a week   Drug use: No   Sexual activity: Not on file  Other Topics Concern   Not on file  Social History Narrative   Not on file   Social Drivers of Health   Financial Resource Strain: Low Risk  (04/03/2023)   Overall Financial Resource Strain (CARDIA)    Difficulty of Paying Living Expenses: Not very hard  Food Insecurity: No Food Insecurity (09/05/2023)   Hunger Vital Sign    Worried About Running Out of Food in the Last Year: Never true    Ran Out of Food in the Last Year: Never true  Transportation Needs: No Transportation Needs (09/05/2023)   PRAPARE - Administrator, Civil Service (Medical): No    Lack of Transportation (Non-Medical): No  Physical Activity: Sufficiently Active (04/03/2023)   Exercise Vital  Sign    Days of Exercise per Week: 4 days    Minutes of Exercise per Session: 40 min  Stress: No Stress Concern Present (04/03/2023)   [employer] of Occupational Health - Occupational Stress Questionnaire    Feeling of Stress : Only a little  Social Connections: Socially Integrated (09/05/2023)   Social Connection and Isolation Panel    Frequency of Communication with Friends and Family: More than three times a week    Frequency of Social Gatherings with Friends and Family: Once a week    Attends Religious Services: More than 4 times per year    Active Member of [employer] or Organizations: Yes    Attends Banker Meetings: More than 4 times per year    Marital Status: Married  Catering manager Violence: Not At Risk (09/05/2023)   Humiliation, Afraid, Rape, and Kick questionnaire    Fear of Current or Ex-Partner: No    Emotionally Abused: No    Physically Abused: No    Sexually Abused: No    Review of Systems  All other systems reviewed and are negative.       Objective    BP 120/77   Pulse 91   Ht 6' 2 (1.88 m)   Wt (!) 455 lb 3.2 oz (206.5 kg)   SpO2 92%   BMI 58.44 kg/m   Physical Exam Vitals and nursing note reviewed.  Constitutional:      General: He is not in acute distress.    Appearance: He is obese.  Cardiovascular:     Rate and Rhythm: Normal rate and regular rhythm.  Pulmonary:     Effort: Pulmonary effort is normal.     Breath sounds: Normal breath sounds.  Abdominal:     Palpations: Abdomen is soft.     Tenderness: There is no abdominal tenderness.  Musculoskeletal:     Right lower leg: Edema present.     Left lower leg: Edema present.     Comments: Bilateral legs with hyperpigmentation and weeping ulcerated lesions.   Neurological:     General: No focal deficit present.     Mental Status: He is alert and oriented to person, place, and time.         Assessment & Plan:   1. Hospital discharge follow-up (Primary) Improving.  Complete course of antibiotics as recommended  2. Cellulitis and abscess of right leg As above  3. Type 2 diabetes mellitus with other skin ulcer, with Duerksen-term current use of insulin  (HCC) Recent A1c was at goal.   4. Essential hypertension Appears stable. Continue   5. Class 3 severe  obesity due to excess calories with serious comorbidity and body mass index (BMI) of 50.0 to 59.9 in adult   6. Encounter for completion of form with patient Handicapped placard form completed.      Return in about 3 months (around 12/18/2023) for follow up, chronic med issues.   Tanda Raguel SQUIBB, MD

## 2023-09-18 ENCOUNTER — Encounter: Payer: Self-pay | Admitting: Family Medicine

## 2023-09-19 ENCOUNTER — Other Ambulatory Visit: Payer: Self-pay | Admitting: Family Medicine

## 2023-09-19 MED ORDER — NYSTATIN 100000 UNIT/GM EX POWD: 1.0000 | Freq: Three times a day (TID) | CUTANEOUS | 2 refills | Status: AC

## 2023-09-19 NOTE — Progress Notes (Signed)
nystatin

## 2023-09-20 ENCOUNTER — Other Ambulatory Visit (HOSPITAL_BASED_OUTPATIENT_CLINIC_OR_DEPARTMENT_OTHER): Payer: Self-pay | Admitting: Family

## 2023-09-20 DIAGNOSIS — I10 Essential (primary) hypertension: Secondary | ICD-10-CM

## 2023-09-20 DIAGNOSIS — I5042 Chronic combined systolic (congestive) and diastolic (congestive) heart failure: Secondary | ICD-10-CM

## 2023-09-25 ENCOUNTER — Telehealth: Payer: Self-pay

## 2023-09-25 NOTE — Telephone Encounter (Signed)
 Fax received from [facility] requesting last OV. Faxed to [employer] at [employer] at [phone].

## 2023-10-02 ENCOUNTER — Encounter: Payer: Self-pay | Admitting: Infectious Diseases

## 2023-10-02 ENCOUNTER — Ambulatory Visit (INDEPENDENT_AMBULATORY_CARE_PROVIDER_SITE_OTHER): Admitting: Infectious Diseases

## 2023-10-02 ENCOUNTER — Other Ambulatory Visit: Payer: Self-pay

## 2023-10-02 VITALS — BP 148/94 | HR 89 | Temp 98.2°F | Ht 74.0 in | Wt >= 6400 oz

## 2023-10-02 DIAGNOSIS — I890 Lymphedema, not elsewhere classified: Secondary | ICD-10-CM | POA: Diagnosis not present

## 2023-10-02 DIAGNOSIS — L03115 Cellulitis of right lower limb: Secondary | ICD-10-CM

## 2023-10-02 DIAGNOSIS — I872 Venous insufficiency (chronic) (peripheral): Secondary | ICD-10-CM | POA: Insufficient documentation

## 2023-10-02 NOTE — Progress Notes (Signed)
 Patient Active Problem List   Diagnosis Date Noted   History of polycythemia 03/24/2023   History of DVT of lower extremity 03/24/2023   Posterior tibial tendinitis of left leg 02/05/2022   Prediabetes 06/24/2019   Lymphedema of both lower extremities 05/13/2016   Skin ulcer of calf with necrosis of muscle (HCC) 05/13/2016   Chronic congestive heart failure (HCC)    Generalized edema    NSVT (nonsustained ventricular tachycardia) (HCC) 03/25/2016   Hypokalemia 03/25/2016   Acute respiratory failure (HCC) 03/21/2016   SOB (shortness of breath) 03/20/2016   Acute respiratory failure with hypoxia (HCC) 03/20/2016   Abdominal pain 03/20/2016   OSA (obstructive sleep apnea) 03/20/2016   Type 2 diabetes mellitus (HCC) 12/29/2015   Wound infection    Hyperglycemia    Cellulitis 12/26/2015   Chronic acquired lymphedema 12/26/2015   Shortness of breath    Varicose veins of lower extremity with ulcer (HCC) 08/31/2012   Morbid obesity (HCC) 08/31/2012   Venous stasis ulcer (HCC) 01/09/2011   Hypertension 01/09/2011   Class 3 severe obesity due to excess calories with serious comorbidity and body mass index (BMI) of 60.0 to 69.9 in adult 01/09/2011    Patient's Medications  New Prescriptions   No medications on file  Previous Medications   ACCU-CHEK SOFTCLIX LANCETS LANCETS    Use as instructed   BLOOD GLUCOSE MONITORING SUPPL (ACCU-CHEK GUIDE ME) W/DEVICE KIT    USE UP TO 4 TIMES A DAY AS DIRECTED   CARVEDILOL  (COREG ) 25 MG TABLET    TAKE 1 TABLET BY MOUTH TWICE A DAY   DAPAGLIFLOZIN  PROPANEDIOL (FARXIGA ) 10 MG TABS TABLET    Take 1 tablet (10 mg total) by mouth daily before breakfast.   EPINEPHRINE  0.3 MG/0.3 ML IJ SOAJ INJECTION    Inject 0.3 mg into the muscle as needed for anaphylaxis.   FUROSEMIDE  (LASIX ) 80 MG TABLET    TAKE 1 TABLET BY MOUTH 2 TIMES DAILY.   GABAPENTIN  (NEURONTIN ) 300 MG CAPSULE    Take 1 capsule (300 mg total) by mouth 3 (three) times daily.   GLUCOSE  BLOOD (ACCU-CHEK GUIDE) TEST STRIP    Use as instructed   HYDRALAZINE  (APRESOLINE ) 50 MG TABLET    TAKE 1 TABLET (50 MG) BY MOUTH IN THE MORNING AND AT BEDTIME   NYSTATIN  (MYCOSTATIN /NYSTOP ) POWDER    Apply 1 Application topically 3 (three) times daily.   SEMAGLUTIDE , 2 MG/DOSE, 8 MG/3ML SOPN    Inject 2 mg as directed once a week.   SPIRONOLACTONE  (ALDACTONE ) 25 MG TABLET    Take 1 tablet (25 mg total) by mouth daily.  Modified Medications   No medications on file  Discontinued Medications   No medications on file    Subjective: Discussed the use of AI scribe software for clinical note transcription with the patient, who gave verbal consent to proceed.   43 Y O male with prior h/o morbid obesity/OSA on CPAP, lymphedema of lower extremities, chronic venous insufficiency s/p bilateral GSV ablations, h/o DVT , anemia, HTN, CHF who is here for HFU after recent admission 6/26-7/3 for cellulitus of rt lower extremity. CT with no abscess or osteomyelitis. Seen by Ortho and Vascular. Started on IV linezolid  and ceftriaxone  initially and later transitioned to PO clindamycin  for 7 days on discharge.  Seen by PCP 7/9  7/24 Reports completing course of PO clindamycin  as instructed without missing doses and symptoms in rt leg has improved. Denies fevers, chills. Denies nausea,  vomiting and diarrhea. Reports following wound care as well as Vascular. Denies smoking, alcohol, and recreational drugs. No complaints.   Review of Systems: all systems reviewed with pertinent positives and negatives as listed above   Past Medical History:  Diagnosis Date   Chronic venous insufficiency    a. as a premature infant, required venous access for care with subsequent DVT and some sort of procedure -> eventually went on to have vein bypass around 2005.   Hypertension    Lymphedema    Morbid obesity (HCC)    OSA on CPAP    Past Surgical History:  Procedure Laterality Date   balloon stenting Right    right leg    VEIN LIGATION AND STRIPPING       Social History   Tobacco Use   Smoking status: Never   Smokeless tobacco: Never  Substance Use Topics   Alcohol use: Yes    Alcohol/week: 4.0 standard drinks of alcohol    Types: 4 Standard drinks or equivalent per week    Comment: liquor - pt states once a week   Drug use: No    Family History  Problem Relation Age of Onset   Diabetes Mother    Cancer Father        colon   Cancer Maternal Grandmother    Cancer Maternal Grandfather     Allergies  Allergen Reactions   Entresto  [Sacubitril -Valsartan ] Swelling and Other (See Comments)    Tongue swelling   Lidocaine  Hives, Shortness Of Breath and Other (See Comments)    burns my skin also- Patient ended up in the ED   Other Anaphylaxis, Swelling and Other (See Comments)    Tree nuts cause swelling   Peanut-Containing Drug Products Anaphylaxis and Swelling   Vancomycin  Anaphylaxis    From careeverywhere records ([facility])   Hydrofera Blue 4X4 [Wound Dressings] Nausea Only and Other (See Comments)    Made the patient light-headed and caused sweating also   Latex Hives and Other (See Comments)    Health Maintenance  Topic Date Due   OPHTHALMOLOGY EXAM  Never done   FOOT EXAM  05/09/2023   Pneumococcal Vaccine 19-27 Years old (1 of 2 - PCV) 04/02/2024 (Originally 04/06/1999)   Hepatitis B Vaccines (1 of 3 - 19+ 3-dose series) 09/16/2024 (Originally 04/06/1999)   HPV VACCINES (1 - 3-dose SCDM series) 09/16/2024 (Originally 04/06/2007)   HEMOGLOBIN A1C  03/06/2024   Diabetic kidney evaluation - Urine ACR  04/02/2024   Diabetic kidney evaluation - eGFR measurement  09/09/2024   Hepatitis C Screening  Completed   HIV Screening  Completed   Meningococcal B Vaccine  Aged Out   DTaP/Tdap/Td  Discontinued   INFLUENZA VACCINE  Discontinued   COVID-19 Vaccine  Discontinued    Objective: BP (!) 148/94   Pulse 89   Temp 98.2 F (36.8 C) (Temporal)   Ht 6' 2 (1.88 m)   Wt (!) 482 lb  (218.6 kg)   SpO2 95%   BMI 61.89 kg/m     Physical Exam Constitutional:      Appearance: Normal appearance. Morbidly obese  HENT:     Head: Normocephalic and atraumatic.      Mouth: Mucous membranes are moist.  Eyes:    Conjunctiva/sclera: Conjunctivae normal.     Pupils: Pupils are equal, round, and b/l symmetrical    Cardiovascular:     Rate and Rhythm: Normal rate and regular rhythm.     Heart sounds: s1s2  Pulmonary:  Effort: Pulmonary effort is normal.     Breath sounds: Normal breath sounds.   Abdominal:     General: Non distended     Palpations: soft.   Musculoskeletal:        General: Normal range of motion.   Skin:    General: Skin is warm and dry.     Comments: Changes s/o chronic venous insufficiency in lower extremities and rt leg venous ulcer, superficial with no signs of infection        Neurological:     General: grossly non focal     Mental Status: awake, alert and oriented to person, place, and time.   Psychiatric:        Mood and Affect: Mood normal.   Lab Results Lab Results  Component Value Date   WBC 11.2 (H) 09/11/2023   HGB 10.8 (L) 09/11/2023   HCT 35.2 (L) 09/11/2023   MCV 81.1 09/11/2023   PLT 306 09/11/2023    Lab Results  Component Value Date   CREATININE 0.85 09/10/2023   BUN 12 09/10/2023   NA 135 09/10/2023   K 3.8 09/10/2023   CL 95 (L) 09/10/2023   CO2 31 09/10/2023    Lab Results  Component Value Date   ALT 21 09/05/2023   AST 21 09/05/2023   ALKPHOS 92 09/05/2023   BILITOT 1.2 09/05/2023    Lab Results  Component Value Date   CHOL 174 06/23/2019   HDL 41 06/23/2019   LDLCALC 111 (H) 06/23/2019   LDLDIRECT 64 02/14/2023   TRIG 122 06/23/2019   CHOLHDL 4.2 06/23/2019   Lab Results  Component Value Date   LABRPR Non Reactive 06/23/2019   No results found for: HIV1RNAQUANT, HIV1RNAVL, CD4TABS   Microbiology Results for orders placed or performed during the hospital encounter of 09/04/23   Culture, blood (routine x 2)     Status: None   Collection Time: 09/04/23  8:54 PM   Specimen: BLOOD  Result Value Ref Range Status   Specimen Description   Final    BLOOD Performed at [facility], [address], [place], [place] [address]    Special Requests   Final    NONE Performed at [facility], [address] [place], [place] [address]    Culture   Final    NO GROWTH 5 DAYS Performed at [facility], [address] [place], [place] [address]    Report Status 09/10/2023 FINAL  Final  Culture, blood (routine x 2)     Status: None   Collection Time: 09/04/23  9:29 PM   Specimen: BLOOD  Result Value Ref Range Status   Specimen Description   Final    BLOOD RIGHT ANTECUBITAL Performed at [facility], [address], [place], [place] [address]    Special Requests   Final    BOTTLES DRAWN AEROBIC AND ANAEROBIC Blood Culture adequate volume Performed at [facility], [address], [place], [place] [address]    Culture   Final    NO GROWTH 5 DAYS Performed at [facility], [address], [place], [place] [address]    Report Status 09/10/2023 FINAL  Final   Imaging CT FEMUR RIGHT W CONTRAST Result Date: 09/09/2023 CLINICAL DATA:  43 year old male with history of chronic bilateral lower extremity lymphedema and multiple prior peripheral vascular interventions. Presenting with continued pain and swelling of the right thigh with concern for cellulitis. EXAM: CT OF THE LOWER RIGHT EXTREMITY  WITH CONTRAST TECHNIQUE: Multidetector CT imaging of the lower right extremity was performed according to the standard protocol following intravenous contrast administration. RADIATION DOSE REDUCTION: This exam was performed according to the departmental dose-optimization program which includes automated exposure control, adjustment of the mA and/or kV according to patient size and/or use of iterative  reconstruction technique. CONTRAST:  OMNIPAQUE  IOHEXOL  300 MG/ML  SOLN COMPARISON:  Right tibia and fibula radiographs dated 12/26/2015. CT abdomen/pelvis dated 01/07/2018. FINDINGS: Bones/Joint/Cartilage No acute fracture or dislocation. No evidence of osteolysis. Mild joint space narrowing of the right hip. Severe medial femorotibial compartment joint space narrowing resulting in bone-on-bone contact, subchondral sclerosis, and marginal osteophytosis. Mild to moderate lateral femorotibial and patellofemoral compartment joint space narrowing with subchondral cystic changes and marginal osteophytosis. No joint effusion. Ligaments Ligaments are suboptimally evaluated by CT. Muscles and Tendons Mild fatty atrophy of the distal sartorius muscle. Fatty atrophy of the mid to distal medial head of the gastrocnemius muscle. No discrete intramuscular collection. Soft tissue Generalized diffuse subcutaneous edema and stranding extending from the mid thigh through the ankle with cutaneous thickening. Cutaneous irregularity extending along the medial and posteromedial right lower calf likely corresponds to ulceration. No soft tissue gas. No discrete loculated fluid collection. Innumerable small diffuse circumferential calcifications are again noted throughout the soft tissues of the mid to distal right calf, which could be secondary to dermatomyositis or prior insult. Less pronounced subcutaneous edema of the proximal to mid right thigh. Numerous chronic venous collaterals and varicosities extending throughout the right lower extremity. Increased size of bulky right external iliac and inguinal lymph nodes including a 2.6 cm short axis right external iliac lymph node and a 4.9 cm short axis right inguinal lymph node (series 12, images 40 and 176). Redemonstrated right inguinal postoperative changes with numerous surgical clips. IMPRESSION: 1. Generalized diffuse nonspecific subcutaneous edema and stranding extending from  the mid thigh through the ankle with cutaneous thickening, as can be seen with chronic lymphedema, however, superimposed cellulitis cannot be excluded. No soft tissue gas or loculated fluid collection. Cutaneous irregularity extending along the medial and posteromedial right lower calf likely corresponds to ulceration. 2. Increased size of bulky enlarged right external iliac and inguinal lymph nodes, including a 2.6 cm short axis right external iliac lymph node and a 4.9 cm short axis right inguinal lymph node. These findings are nonspecific and favored reactive. 3. Chronic venous collaterals and varicosities extending throughout the right lower extremity. 4. Chronic innumerable small diffuse calcifications throughout the subcutaneous tissues of the mid to distal right calf, which could be secondary to dermatomyositis or prior insult. 5. Tricompartmental osteoarthritis of the right knee with severe medial compartment joint space narrowing resulting in bone-on-bone contact. Electronically Signed   By: Harrietta Sherry M.D.   On: 09/09/2023 16:11   CT TIBIA FIBULA RIGHT W CONTRAST Result Date: 09/09/2023 CLINICAL DATA:  43 year old male with history of chronic bilateral lower extremity lymphedema and multiple prior peripheral vascular interventions. Presenting with continued pain and swelling of the right thigh with concern for cellulitis. EXAM: CT OF THE LOWER RIGHT EXTREMITY WITH CONTRAST TECHNIQUE: Multidetector CT imaging of the lower right extremity was performed according to the standard protocol following intravenous contrast administration. RADIATION DOSE REDUCTION: This exam was performed according to the departmental dose-optimization program which includes automated exposure control, adjustment of the mA and/or kV according to patient size and/or use of iterative reconstruction technique. CONTRAST:  OMNIPAQUE  IOHEXOL  300 MG/ML  SOLN COMPARISON:  Right tibia and  fibula radiographs dated 12/26/2015.  CT abdomen/pelvis dated 01/07/2018. FINDINGS: Bones/Joint/Cartilage No acute fracture or dislocation. No evidence of osteolysis. Mild joint space narrowing of the right hip. Severe medial femorotibial compartment joint space narrowing resulting in bone-on-bone contact, subchondral sclerosis, and marginal osteophytosis. Mild to moderate lateral femorotibial and patellofemoral compartment joint space narrowing with subchondral cystic changes and marginal osteophytosis. No joint effusion. Ligaments Ligaments are suboptimally evaluated by CT. Muscles and Tendons Mild fatty atrophy of the distal sartorius muscle. Fatty atrophy of the mid to distal medial head of the gastrocnemius muscle. No discrete intramuscular collection. Soft tissue Generalized diffuse subcutaneous edema and stranding extending from the mid thigh through the ankle with cutaneous thickening. Cutaneous irregularity extending along the medial and posteromedial right lower calf likely corresponds to ulceration. No soft tissue gas. No discrete loculated fluid collection. Innumerable small diffuse circumferential calcifications are again noted throughout the soft tissues of the mid to distal right calf, which could be secondary to dermatomyositis or prior insult. Less pronounced subcutaneous edema of the proximal to mid right thigh. Numerous chronic venous collaterals and varicosities extending throughout the right lower extremity. Increased size of bulky right external iliac and inguinal lymph nodes including a 2.6 cm short axis right external iliac lymph node and a 4.9 cm short axis right inguinal lymph node (series 12, images 40 and 176). Redemonstrated right inguinal postoperative changes with numerous surgical clips. IMPRESSION: 1. Generalized diffuse nonspecific subcutaneous edema and stranding extending from the mid thigh through the ankle with cutaneous thickening, as can be seen with chronic lymphedema, however, superimposed cellulitis cannot be  excluded. No soft tissue gas or loculated fluid collection. Cutaneous irregularity extending along the medial and posteromedial right lower calf likely corresponds to ulceration. 2. Increased size of bulky enlarged right external iliac and inguinal lymph nodes, including a 2.6 cm short axis right external iliac lymph node and a 4.9 cm short axis right inguinal lymph node. These findings are nonspecific and favored reactive. 3. Chronic venous collaterals and varicosities extending throughout the right lower extremity. 4. Chronic innumerable small diffuse calcifications throughout the subcutaneous tissues of the mid to distal right calf, which could be secondary to dermatomyositis or prior insult. 5. Tricompartmental osteoarthritis of the right knee with severe medial compartment joint space narrowing resulting in bone-on-bone contact. Electronically Signed   By: Harrietta Sherry M.D.   On: 09/09/2023 16:11   VAS US  LOWER EXTREMITY VENOUS (DVT) Result Date: 09/07/2023  Lower Venous DVT Study Patient Name:  [name]  Date of Exam:   09/06/2023 Medical Rec #: [id number]      Accession #:    [id number] Date of Birth: October 29, 1980      Patient Gender: M Patient Age:   43 years Exam Location[facility]  [facility] Procedure:      VAS US  LOWER EXTREMITY VENOUS (DVT) Referring Phys: ISAIAH LEVER --------------------------------------------------------------------------------  Indications: Edema, and Chronic venous stasis wound right distal calf. Other             Chronic venous insufficiency, chronic lower extremity Indications:      lymphedema. Limitations: Body habitus (BMI 64.45), poor ultrasound/tissue interface and Skin texture. Comparison Study: History of multiple limited/non diagnostic right LEVs,                   including 04/04/23, 11/02/20, 03/20/16, 12/19/15 Performing Technologist: Alberta Lis RVS  Examination Guidelines: A complete evaluation includes B-mode imaging, spectral Doppler, color Doppler, and  power Doppler as needed of all  accessible portions of each vessel. Bilateral testing is considered an integral part of a complete examination. Limited examinations for reoccurring indications may be performed as noted. The reflux portion of the exam is performed with the patient in reverse Trendelenburg.  +---------+---------------+---------+-----------+----------+--------------+ RIGHT    CompressibilityPhasicitySpontaneityPropertiesThrombus Aging +---------+---------------+---------+-----------+----------+--------------+ CFV                                                   Not visualized +---------+---------------+---------+-----------+----------+--------------+ SFJ                                                   Not visualized +---------+---------------+---------+-----------+----------+--------------+ FV Prox                                               Not visualized +---------+---------------+---------+-----------+----------+--------------+ FV Mid                                                Not visualized +---------+---------------+---------+-----------+----------+--------------+ FV Distal                                             Not visualized +---------+---------------+---------+-----------+----------+--------------+ PFV                                                   Not visualized +---------+---------------+---------+-----------+----------+--------------+ POP                                                   Not visualized +---------+---------------+---------+-----------+----------+--------------+ PTV                                                   Not visualized +---------+---------------+---------+-----------+----------+--------------+ PERO                                                  Not visualized +---------+---------------+---------+-----------+----------+--------------+   Left Technical Findings: Left leg not  evaluated.   Summary: RIGHT: Non diagnostic exam secondary to body habitus and skin texture - Ultrasound characteristics of enlarged lymph nodes are noted in the groin.   *See table(s) above for measurements and observations. Electronically signed by Norman Serve on 09/07/2023 at 8:51:53 AM.    Final    Assessment/Plan # Rt leg Cellulitis  # Chronic venous insufficiency  # Lymphedema of  lower extremities  S/p completion of course of clindamycin  and improved   Plan - keep legs elevated as feasible - compression stockings, lymphedema pump - follow with Vascular and wound care  - discussed signs and symptoms of infection at which time seek attention  - fu as needed   # Morbid obesity  - discuss about benefit from weight loss measures and will fu with PCP   I spent 60 minutes involved in face-to-face and non-face-to-face activities for this patient on the day of the visit. Professional time spent includes the following activities: Preparing to see the patient (review of tests), Obtaining and reviewing separately obtained history (discharge record 7/3, consult notes from Ortho and Vascular), Documenting clinical information in the EMR, Independently interpreting results (not separately reported), Communicating results to the patient, Counseling and educating the patient.  Of note, portions of this note may have been created with voice recognition software. While this note has been edited for accuracy, occasional wrong-word or 'sound-a-like' substitutions may have occurred due to the inherent limitations of voice recognition software.   Annalee Joseph, MD [facility] 10/02/2023, 2:11 PM

## 2023-10-21 ENCOUNTER — Telehealth: Payer: Self-pay

## 2023-10-21 NOTE — Telephone Encounter (Signed)
 Pharmacy Patient Advocate Encounter   Received notification from CoverMyMeds that prior authorization for OZEMPIC  is required/requested.   Insurance verification completed.   The patient is insured through [facility] .   Per test claim: PA required; PA submitted to above mentioned insurance via CoverMyMeds Key/confirmation #/EOC [id number] Status is pending

## 2023-10-27 ENCOUNTER — Telehealth: Payer: Self-pay

## 2023-10-27 NOTE — Telephone Encounter (Signed)
 Per [employer], prior authorization for Ozempic  not required at this time. Notice letter uploaded to media.

## 2023-11-15 ENCOUNTER — Other Ambulatory Visit: Payer: Self-pay | Admitting: Physician Assistant

## 2023-11-15 ENCOUNTER — Other Ambulatory Visit (HOSPITAL_BASED_OUTPATIENT_CLINIC_OR_DEPARTMENT_OTHER): Payer: Self-pay | Admitting: Family

## 2023-11-15 DIAGNOSIS — I10 Essential (primary) hypertension: Secondary | ICD-10-CM

## 2023-11-15 DIAGNOSIS — I83009 Varicose veins of unspecified lower extremity with ulcer of unspecified site: Secondary | ICD-10-CM

## 2023-12-07 ENCOUNTER — Other Ambulatory Visit (HOSPITAL_BASED_OUTPATIENT_CLINIC_OR_DEPARTMENT_OTHER): Payer: Self-pay | Admitting: Family

## 2023-12-07 DIAGNOSIS — I5042 Chronic combined systolic (congestive) and diastolic (congestive) heart failure: Secondary | ICD-10-CM

## 2023-12-07 DIAGNOSIS — I10 Essential (primary) hypertension: Secondary | ICD-10-CM

## 2023-12-18 ENCOUNTER — Encounter: Payer: Self-pay | Admitting: Family Medicine

## 2023-12-18 ENCOUNTER — Ambulatory Visit: Admitting: Family Medicine

## 2023-12-18 VITALS — BP 193/107 | HR 85 | Ht 74.0 in | Wt >= 6400 oz

## 2023-12-18 DIAGNOSIS — I5042 Chronic combined systolic (congestive) and diastolic (congestive) heart failure: Secondary | ICD-10-CM | POA: Diagnosis not present

## 2023-12-18 DIAGNOSIS — E119 Type 2 diabetes mellitus without complications: Secondary | ICD-10-CM

## 2023-12-18 DIAGNOSIS — Z79899 Other long term (current) drug therapy: Secondary | ICD-10-CM

## 2023-12-18 DIAGNOSIS — H539 Unspecified visual disturbance: Secondary | ICD-10-CM

## 2023-12-18 DIAGNOSIS — E66813 Obesity, class 3: Secondary | ICD-10-CM

## 2023-12-18 DIAGNOSIS — Z6841 Body Mass Index (BMI) 40.0 and over, adult: Secondary | ICD-10-CM

## 2023-12-18 DIAGNOSIS — I10 Essential (primary) hypertension: Secondary | ICD-10-CM | POA: Diagnosis not present

## 2023-12-18 MED ORDER — DAPAGLIFLOZIN PROPANEDIOL 10 MG PO TABS: 10.0000 mg | ORAL_TABLET | Freq: Every day | ORAL | 5 refills | Status: AC

## 2023-12-18 NOTE — Progress Notes (Signed)
 Established Patient Office Visit  Subjective    Patient ID: [name], male    DOB: 10-06-1980  Age: 43 y.o. MRN: [id number]  CC:  Chief Complaint  Patient presents with   Medical Management of Chronic Issues    3 month follow up. Asking about generic version of farxiga     HPI [name] presents for routine follow up of chronic med issues including diabetes and hypertension. Patient reports med compliance and denies acute complaints.   Outpatient Encounter Medications as of 12/18/2023  Medication Sig   Accu-Chek Softclix Lancets lancets Use as instructed   Blood Glucose Monitoring Suppl (ACCU-CHEK GUIDE ME) w/Device KIT USE UP TO 4 TIMES A DAY AS DIRECTED   carvedilol  (COREG ) 25 MG tablet Take 1 tablet (25 mg total) by mouth 2 (two) times daily. Please call [phone] to schedule a January appointment with Reche Finder, NP for future refills. Thank you.   EPINEPHrine  0.3 mg/0.3 mL IJ SOAJ injection Inject 0.3 mg into the muscle as needed for anaphylaxis.   furosemide  (LASIX ) 80 MG tablet TAKE 1 TABLET BY MOUTH 2 TIMES DAILY.   gabapentin  (NEURONTIN ) 300 MG capsule TAKE 1 CAPSULE(300 MG) BY MOUTH THREE TIMES DAILY   glucose blood (ACCU-CHEK GUIDE) test strip Use as instructed   hydrALAZINE  (APRESOLINE ) 50 MG tablet TAKE 1 TABLET (50 MG) BY MOUTH IN THE MORNING AND AT BEDTIME   nystatin  (MYCOSTATIN /NYSTOP ) powder Apply 1 Application topically 3 (three) times daily.   Semaglutide , 2 MG/DOSE, 8 MG/3ML SOPN Inject 2 mg as directed once a week.   spironolactone  (ALDACTONE ) 25 MG tablet TAKE 1 TABLET BY MOUTH EVERY DAY   [DISCONTINUED] dapagliflozin  propanediol (FARXIGA ) 10 MG TABS tablet Take 1 tablet (10 mg total) by mouth daily before breakfast.   dapagliflozin  propanediol (FARXIGA ) 10 MG TABS tablet Take 1 tablet (10 mg total) by mouth daily before breakfast.   No facility-administered encounter medications on file as of 12/18/2023.    Past Medical History:  Diagnosis  Date   Chronic venous insufficiency    a. as a premature infant, required venous access for care with subsequent DVT and some sort of procedure -> eventually went on to have vein bypass around 2005.   Hypertension    Lymphedema    Morbid obesity (HCC)    OSA on CPAP     Past Surgical History:  Procedure Laterality Date   balloon stenting Right    right leg   VEIN LIGATION AND STRIPPING      Family History  Problem Relation Age of Onset   Diabetes Mother    Cancer Father        colon   Cancer Maternal Grandmother    Cancer Maternal Grandfather     Social History   Socioeconomic History   Marital status: Single    Spouse name: Not on file   Number of children: Not on file   Years of education: Not on file   Highest education level: Master's degree (e.g., MA, MS, MEng, MEd, MSW, MBA)  Occupational History   Not on file  Tobacco Use   Smoking status: Never   Smokeless tobacco: Never  Substance and Sexual Activity   Alcohol use: Yes    Alcohol/week: 4.0 standard drinks of alcohol    Types: 4 Standard drinks or equivalent per week    Comment: liquor - pt states once a week   Drug use: No   Sexual activity: Not on file  Other Topics Concern  Not on file  Social History Narrative   Not on file   Social Drivers of Health   Financial Resource Strain: Low Risk  (12/14/2023)   Overall Financial Resource Strain (CARDIA)    Difficulty of Paying Living Expenses: Not very hard  Food Insecurity: No Food Insecurity (12/14/2023)   Hunger Vital Sign    Worried About Running Out of Food in the Last Year: Never true    Ran Out of Food in the Last Year: Never true  Transportation Needs: No Transportation Needs (12/14/2023)   PRAPARE - Administrator, Civil Service (Medical): No    Lack of Transportation (Non-Medical): No  Physical Activity: Sufficiently Active (12/14/2023)   Exercise Vital Sign    Days of Exercise per Week: 3 days    Minutes of Exercise per Session:  60 min  Stress: Stress Concern Present (12/14/2023)   [employer] of Occupational Health - Occupational Stress Questionnaire    Feeling of Stress: To some extent  Social Connections: Socially Integrated (12/14/2023)   Social Connection and Isolation Panel    Frequency of Communication with Friends and Family: More than three times a week    Frequency of Social Gatherings with Friends and Family: Twice a week    Attends Religious Services: More than 4 times per year    Active Member of [employer] or Organizations: Yes    Attends Banker Meetings: More than 4 times per year    Marital Status: Married  Catering manager Violence: Not At Risk (09/05/2023)   Humiliation, Afraid, Rape, and Kick questionnaire    Fear of Current or Ex-Partner: No    Emotionally Abused: No    Physically Abused: No    Sexually Abused: No    Review of Systems  All other systems reviewed and are negative.       Objective    BP (!) 193/107   Pulse 85   Ht 6' 2 (1.88 m)   Wt (!) 480 lb 6.4 oz (217.9 kg)   SpO2 96%   BMI 61.68 kg/m   Physical Exam Vitals and nursing note reviewed.  Constitutional:      General: He is not in acute distress.    Appearance: He is obese.  Cardiovascular:     Rate and Rhythm: Normal rate and regular rhythm.  Pulmonary:     Effort: Pulmonary effort is normal.     Breath sounds: Normal breath sounds.  Abdominal:     Palpations: Abdomen is soft.     Tenderness: There is no abdominal tenderness.  Musculoskeletal:     Right lower leg: Edema present.     Left lower leg: Edema present.  Neurological:     General: No focal deficit present.     Mental Status: He is alert and oriented to person, place, and time.         Assessment & Plan:   Type 2 diabetes mellitus without complication, without [place]-term current use of insulin  (HCC) -     Ambulatory referral to Ophthalmology  Uncontrolled hypertension  Chronic combined systolic and diastolic CHF  (congestive heart failure) (HCC) -     Dapagliflozin  Propanediol; Take 1 tablet (10 mg total) by mouth daily before breakfast.  Dispense: 30 tablet; Refill: 5  Visual changes  Class 3 severe obesity due to excess calories with serious comorbidity and body mass index (BMI) of 60.0 to 69.9 in adult [facility])     No follow-ups on file.   Tanda Bleacher  P, MD

## 2024-02-04 ENCOUNTER — Encounter: Payer: Self-pay | Admitting: Family Medicine

## 2024-02-09 ENCOUNTER — Other Ambulatory Visit: Payer: Self-pay | Admitting: Family Medicine

## 2024-02-09 ENCOUNTER — Telehealth: Payer: Self-pay | Admitting: Family Medicine

## 2024-02-09 MED ORDER — SEMAGLUTIDE (2 MG/DOSE) 8 MG/3ML ~~LOC~~ SOPN: 2.0000 mg | PEN_INJECTOR | SUBCUTANEOUS | 1 refills | Status: AC

## 2024-02-09 NOTE — Telephone Encounter (Unsigned)
 Copied from CRM [id number]. Topic: Clinical - Medication Refill >> Feb 09, 2024 11:54 AM Tysheama G wrote: Medication: Semaglutide , 2 MG/DOSE, 8 MG/3ML SOPN  Has the patient contacted their pharmacy? Yes (Agent: If no, request that the patient contact the pharmacy for the refill. If patient does not wish to contact the pharmacy document the reason why and proceed with request.) (Agent: If yes, when and what did the pharmacy advise?)  This is the patient's preferred pharmacy:    [facility] - [place], [place] - [address] [place] [address] Phone: [phone] Fax: [phone]    Is this the correct pharmacy for this prescription? Yes If no, delete pharmacy and type the correct one.   Has the prescription been filled recently? Yes  Is the patient out of the medication? Yes  Has the patient been seen for an appointment in the last year OR does the patient have an upcoming appointment? Yes  Can we respond through MyChart? Yes  Agent: Please be advised that Rx refills may take up to 3 business days. We ask that you follow-up with your pharmacy.

## 2024-02-11 ENCOUNTER — Telehealth: Payer: Self-pay

## 2024-02-11 NOTE — Telephone Encounter (Signed)
 Copied from CRM [id number]. Topic: Clinical - Medication Refill >> Feb 09, 2024 11:54 AM Tysheama G wrote: Medication: Semaglutide , 2 MG/DOSE, 8 MG/3ML SOPN  Has the patient contacted their pharmacy? Yes (Agent: If no, request that the patient contact the pharmacy for the refill. If patient does not wish to contact the pharmacy document the reason why and proceed with request.) (Agent: If yes, when and what did the pharmacy advise?)  This is the patient's preferred pharmacy:    [facility] - [place], [place] - [address] [place] [address] Phone: [phone] Fax: [phone]    Is this the correct pharmacy for this prescription? Yes If no, delete pharmacy and type the correct one.   Has the prescription been filled recently? Yes  Is the patient out of the medication? Yes  Has the patient been seen for an appointment in the last year OR does the patient have an upcoming appointment? Yes  Can we respond through MyChart? Yes  Agent: Please be advised that Rx refills may take up to 3 business days. We ask that you follow-up with your pharmacy. >> Feb 11, 2024  1:57 PM Emylou G wrote: Patient calling checking status of refill?  Does he need to be seen?

## 2024-02-13 NOTE — Telephone Encounter (Signed)
 Copied from CRM [id number]. Topic: Clinical - Medication Refill >> Feb 09, 2024 11:54 AM Tysheama G wrote: Medication: Semaglutide , 2 MG/DOSE, 8 MG/3ML SOPN  Has the patient contacted their pharmacy? Yes (Agent: If no, request that the patient contact the pharmacy for the refill. If patient does not wish to contact the pharmacy document the reason why and proceed with request.) (Agent: If yes, when and what did the pharmacy advise?)  This is the patient's preferred pharmacy:    [facility] - [place], [place] - [address] [place] [address] Phone: [phone] Fax: [phone]    Is this the correct pharmacy for this prescription? Yes If no, delete pharmacy and type the correct one.   Has the prescription been filled recently? Yes  Is the patient out of the medication? Yes  Has the patient been seen for an appointment in the last year OR does the patient have an upcoming appointment? Yes  Can we respond through MyChart? Yes  Agent: Please be advised that Rx refills may take up to 3 business days. We ask that you follow-up with your pharmacy. >> Feb 11, 2024  1:57 PM Emylou G wrote: Patient calling checking status of refill?  Does he need to be seen?

## 2024-02-18 ENCOUNTER — Ambulatory Visit: Payer: Self-pay | Admitting: Family Medicine

## 2024-02-18 NOTE — Telephone Encounter (Signed)
 FYI Only or Action Required?: Action required by provider: Requesting medication for cough.  Patient was last seen in primary care on 12/18/2023 by Tanda Bleacher, MD.  Called Nurse Triage reporting Cough.  Symptoms began several days ago.  Interventions attempted: OTC medications: Sudafed, cold and cough.  Symptoms are: unchanged.  Triage Disposition: Home Care  Patient/caregiver understands and will follow disposition?: Yes Reason for Disposition  Cough  Answer Assessment - Initial Assessment Questions Tried taking Sudafed and cold and cough medication. Patient is requesting medication be sent to [facility] pharmacy on file. Please advise.   1. ONSET: When did the cough begin?      2-3 days ago  2. SPUTUM: Describe the color of your sputum (e.g., none, dry cough; clear, white, yellow, green)     Yellow  3. DIFFICULTY BREATHING: Are you having difficulty breathing? If Yes, ask: How bad is it? (e.g., mild, moderate, severe)      Mild SOB, a little worse than baseline  4. FEVER: Do you have a fever? If Yes, ask: What is your temperature, how was it measured, and when did it start?     Denies  5. CARDIAC HISTORY: Do you have any history of heart disease? (e.g., heart attack, congestive heart failure)      CHF  6. LUNG HISTORY: Do you have any history of lung disease?  (e.g., pulmonary embolus, asthma, emphysema)     Denies  7. OTHER SYMPTOMS: Do you have any other symptoms? (e.g., runny nose, wheezing, chest pain)       Sore throat, wheezing  Protocols used: Cough - Acute Productive-A-AH  Copied from CRM #[id number]. Topic: Clinical - Red Word Triage >> Feb 18, 2024  3:51 PM Antwanette L wrote: Red Word that prompted transfer to Nurse Triage: Pt reports congestion and is coughing up a yellow mucus. He states it is hard to breathe when lying down

## 2024-02-19 NOTE — Telephone Encounter (Signed)
 Noted

## 2024-03-18 ENCOUNTER — Other Ambulatory Visit: Payer: Self-pay

## 2024-03-19 ENCOUNTER — Telehealth: Payer: Self-pay

## 2024-03-19 ENCOUNTER — Other Ambulatory Visit: Payer: Self-pay

## 2024-03-19 NOTE — Telephone Encounter (Signed)
 Pharmacy Patient Advocate Encounter  Received notification from [facility] that Prior Authorization for OZEMPIC  has been APPROVED from 03/18/2024 to 03/18/2025   PA #/Case ID/Reference #: [id number]

## 2024-03-22 ENCOUNTER — Ambulatory Visit: Admitting: Family Medicine

## 2024-03-22 ENCOUNTER — Encounter: Payer: Self-pay | Admitting: Family Medicine

## 2024-03-22 VITALS — BP 164/103 | HR 100 | Ht 74.0 in | Wt >= 6400 oz

## 2024-03-22 DIAGNOSIS — Z6841 Body Mass Index (BMI) 40.0 and over, adult: Secondary | ICD-10-CM | POA: Diagnosis not present

## 2024-03-22 DIAGNOSIS — E119 Type 2 diabetes mellitus without complications: Secondary | ICD-10-CM | POA: Diagnosis not present

## 2024-03-22 DIAGNOSIS — I10 Essential (primary) hypertension: Secondary | ICD-10-CM | POA: Diagnosis not present

## 2024-03-22 DIAGNOSIS — L989 Disorder of the skin and subcutaneous tissue, unspecified: Secondary | ICD-10-CM | POA: Diagnosis not present

## 2024-03-22 DIAGNOSIS — E66813 Obesity, class 3: Secondary | ICD-10-CM

## 2024-03-22 DIAGNOSIS — I5042 Chronic combined systolic (congestive) and diastolic (congestive) heart failure: Secondary | ICD-10-CM

## 2024-03-22 NOTE — Progress Notes (Unsigned)
 [id number]  Established Patient Office Visit  Subjective    Patient ID: [name], male    DOB: 1980/10/15  Age: 44 y.o. MRN: [id number]  CC:  Chief Complaint  Patient presents with   Annual Exam    HPI [name] presents with complaint of left thumb lesion. He reports he first noted it the week before thanksgiving and had bleeding. He thought it was healing but instead it has been enlarging. It is some tender. Patient is right hand dominant.   Outpatient Encounter Medications as of 03/22/2024  Medication Sig   carvedilol  (COREG ) 25 MG tablet Take 1 tablet (25 mg total) by mouth 2 (two) times daily. Please call [phone] to schedule a January appointment with Reche Finder, NP for future refills. Thank you.   dapagliflozin  propanediol (FARXIGA ) 10 MG TABS tablet Take 1 tablet (10 mg total) by mouth daily before breakfast.   EPINEPHrine  0.3 mg/0.3 mL IJ SOAJ injection Inject 0.3 mg into the muscle as needed for anaphylaxis.   furosemide  (LASIX ) 80 MG tablet TAKE 1 TABLET BY MOUTH 2 TIMES DAILY.   gabapentin  (NEURONTIN ) 300 MG capsule TAKE 1 CAPSULE(300 MG) BY MOUTH THREE TIMES DAILY   hydrALAZINE  (APRESOLINE ) 50 MG tablet TAKE 1 TABLET (50 MG) BY MOUTH IN THE MORNING AND AT BEDTIME   nystatin  (MYCOSTATIN /NYSTOP ) powder Apply 1 Application topically 3 (three) times daily.   Semaglutide , 2 MG/DOSE, 8 MG/3ML SOPN Inject 2 mg as directed once a week.   spironolactone  (ALDACTONE ) 25 MG tablet TAKE 1 TABLET BY MOUTH EVERY DAY   Accu-Chek Softclix Lancets lancets Use as instructed   Blood Glucose Monitoring Suppl (ACCU-CHEK GUIDE ME) w/Device KIT USE UP TO 4 TIMES A DAY AS DIRECTED   glucose blood (ACCU-CHEK GUIDE) test strip Use as instructed   No facility-administered encounter medications on file as of 03/22/2024.    Past Medical History:  Diagnosis Date   Chronic venous insufficiency    a. as a premature infant, required venous access for care with subsequent DVT and  some sort of procedure -> eventually went on to have vein bypass around 2005.   Hypertension    Lymphedema    Morbid obesity (HCC)    OSA on CPAP     Past Surgical History:  Procedure Laterality Date   balloon stenting Right    right leg   VEIN LIGATION AND STRIPPING      Family History  Problem Relation Age of Onset   Diabetes Mother    Cancer Father        colon   Cancer Maternal Grandmother    Cancer Maternal Grandfather     Social History   Socioeconomic History   Marital status: Single    Spouse name: Not on file   Number of children: Not on file   Years of education: Not on file   Highest education level: Master's degree (e.g., MA, MS, MEng, MEd, MSW, MBA)  Occupational History   Not on file  Tobacco Use   Smoking status: Never   Smokeless tobacco: Never  Substance and Sexual Activity   Alcohol use: Yes    Alcohol/week: 4.0 standard drinks of alcohol    Types: 4 Standard drinks or equivalent per week    Comment: liquor - pt states once a week   Drug use: No   Sexual activity: Not on file  Other Topics Concern   Not on file  Social History Narrative   Not on file   Social  Drivers of Health   Tobacco Use: Low Risk (03/22/2024)   Patient History    Smoking Tobacco Use: Never    Smokeless Tobacco Use: Never    Passive Exposure: Not on file  Financial Resource Strain: Low Risk (12/14/2023)   Overall Financial Resource Strain (CARDIA)    Difficulty of Paying Living Expenses: Not very hard  Food Insecurity: No Food Insecurity (12/14/2023)   Epic    Worried About Programme Researcher, Broadcasting/film/video in the Last Year: Never true    Ran Out of Food in the Last Year: Never true  Transportation Needs: No Transportation Needs (12/14/2023)   Epic    Lack of Transportation (Medical): No    Lack of Transportation (Non-Medical): No  Physical Activity: Sufficiently Active (12/14/2023)   Exercise Vital Sign    Days of Exercise per Week: 3 days    Minutes of Exercise per Session: 60  min  Stress: Stress Concern Present (12/14/2023)   [employer] of Occupational Health - Occupational Stress Questionnaire    Feeling of Stress: To some extent  Social Connections: Socially Integrated (12/14/2023)   Social Connection and Isolation Panel    Frequency of Communication with Friends and Family: More than three times a week    Frequency of Social Gatherings with Friends and Family: Twice a week    Attends Religious Services: More than 4 times per year    Active Member of Clubs or Organizations: Yes    Attends Banker Meetings: More than 4 times per year    Marital Status: Married  Catering Manager Violence: Not At Risk (09/05/2023)   Epic    Fear of Current or Ex-Partner: No    Emotionally Abused: No    Physically Abused: No    Sexually Abused: No  Depression (PHQ2-9): Low Risk (04/03/2023)   Depression (PHQ2-9)    PHQ-2 Score: 0  Alcohol Screen: Low Risk (12/14/2023)   Alcohol Screen    Last Alcohol Screening Score (AUDIT): 1  Housing: Low Risk (03/22/2024)   Epic    Unable to Pay for Housing in the Last Year: No    Number of Times Moved in the Last Year: 0    Homeless in the Last Year: No  Utilities: Not At Risk (09/05/2023)   Epic    Threatened with loss of utilities: No  Health Literacy: Adequate Health Literacy (03/22/2024)   B1300 Health Literacy    Frequency of need for help with medical instructions: Never    Review of Systems  All other systems reviewed and are negative.       Objective    BP (!) 164/103   Pulse 100   Ht 6' 2 (1.88 m)   Wt (!) 491 lb (222.7 kg)   SpO2 95%   BMI 63.04 kg/m   Physical Exam Vitals and nursing note reviewed.  Constitutional:      General: He is not in acute distress.    Appearance: He is obese.  Cardiovascular:     Rate and Rhythm: Normal rate and regular rhythm.  Pulmonary:     Effort: Pulmonary effort is normal.     Breath sounds: Normal breath sounds.  Abdominal:     General: There is no  distension.     Tenderness: There is no abdominal tenderness.  Musculoskeletal:     Right lower leg: Edema present.     Left lower leg: Edema present.  Skin:    Findings: Lesion present.  Neurological:  General: No focal deficit present.     Mental Status: He is alert and oriented to person, place, and time.           Assessment & Plan:   1. Type 2 diabetes mellitus without complication, without [place]-term current use of insulin  (HCC) (Primary) A1c at goal. Continue  - POCT glycosylated hemoglobin (Hb A1C)  2. Uncontrolled hypertension Discussed compliance. Keep scheduled appt with consultant  3. Chronic combined systolic and diastolic CHF (congestive heart failure) (HCC) As above  4. Class 3 severe obesity due to excess calories with serious comorbidity and body mass index (BMI) of 60.0 to 69.9 in adult (HCC)   5. Skin lesion  - Ambulatory referral to Dermatology     No follow-ups on file.   Tanda Raguel SQUIBB, MD

## 2024-03-23 LAB — POCT GLYCOSYLATED HEMOGLOBIN (HGB A1C): HbA1c, POC (controlled diabetic range): 7.8 % — AB (ref 0.0–7.0)

## 2024-03-25 ENCOUNTER — Ambulatory Visit: Admitting: Dermatology

## 2024-03-25 ENCOUNTER — Encounter: Payer: Self-pay | Admitting: Dermatology

## 2024-03-25 DIAGNOSIS — L980 Pyogenic granuloma: Secondary | ICD-10-CM | POA: Diagnosis not present

## 2024-03-25 DIAGNOSIS — D485 Neoplasm of uncertain behavior of skin: Secondary | ICD-10-CM

## 2024-03-25 MED ORDER — DOXYCYCLINE HYCLATE 100 MG PO CAPS: 100.0000 mg | ORAL_CAPSULE | Freq: Two times a day (BID) | ORAL | 0 refills | Status: AC

## 2024-03-25 MED ORDER — MUPIROCIN 2 % EX OINT: 1.0000 | TOPICAL_OINTMENT | Freq: Two times a day (BID) | CUTANEOUS | 0 refills | Status: AC

## 2024-03-25 NOTE — Patient Instructions (Addendum)

## 2024-03-25 NOTE — Progress Notes (Signed)
" ° °  New Patient Visit   Patient (and/or pt guardian) consented to the use of AI-assisted tools for note generation.   Subjective  [name] is a 44 y.o. male who presents for the following: skin lesion  Located at the left thumb that he  would like to have examined.  Patient reports the areas have been there for 2 months He reports the areas are bothersome. He states that the areas have not spread.  Patient reports he did go to an urgent care and his PCP and was referred to dermatology with no prescriptions and was told to take tylenol   Patient reports he is not using any OTC meds on the area and has just been keeping it wrapped  The following portions of the chart were reviewed this encounter and updated as appropriate: medications, allergies, medical history  Review of Systems:  No other skin or systemic complaints except as noted in HPI or Assessment and Plan.  Objective  Well appearing patient in no apparent distress; mood and affect are within normal limits.  A focused examination was performed of the following areas: Left Thumb  Relevant exam findings are noted in the Assessment and Plan.     Left palmer aspect of Thumb 1.5 cm friable nodule  Assessment & Plan   Suspected Pyogenic granuloma of left thumb Lesion on the left palmar aspect of the thumb, measuring 1.5 cm, friable, and consistent with a possible diagnosis of pyogenic granuloma. Likely resulted from trauma, possibly from a splinter. Rapid growth due to overproliferation of tissue and blood vessels. Significant bleeding expected due to vascular nature. - Performed shave removal and cauterized the base to reduce recurrence risk. - Sent excised tissue to the lab for histopathological confirmation. - Used Marcaine for local anesthesia due to lidocaine  allergy. - Flushed area with normal saline for temporary numbing. - Prescribed doxycycline  100 mg twice daily for 5 days. - Instructed to keep bandage on for 24  hours, then clean with soap and water, apply mupirocin , and cover with a tight bandage. - Advised to take Tylenol  for pain management, avoid Advil or Aleve. - Instructed to keep the area dry for two days post-procedure. NEOPLASM OF UNCERTAIN BEHAVIOR OF SKIN Left palmer aspect of Thumb - Epidermal / dermal shaving  Lesion diameter (cm):  1.5 Informed consent: discussed and consent obtained   Procedure prep:  Patient was prepped and draped in usual sterile fashion Prep type:  Isopropyl alcohol Anesthesia: the lesion was anesthetized in a standard fashion   Instrument used: DermaBlade   Hemostasis achieved with: electrodesiccation   Outcome: patient tolerated procedure well   Post-procedure details: wound care instructions given   Additional details:  3 cc of 0.25% Bupivacaine injected as local anesthetic  Specimen 1 - Surgical pathology Differential Diagnosis: r/o pyogenic granuloma  Check Margins: No This Visit - doxycycline  (VIBRAMYCIN ) 100 MG capsule - Take 1 capsule (100 mg total) by mouth 2 (two) times daily. For 5 days - mupirocin  ointment (BACTROBAN ) 2 % - Apply 1 Application topically 2 (two) times daily.  Return if symptoms worsen or fail to improve.  [name], am acting as a neurosurgeon for [employer], DO .   Documentation: I have reviewed the above documentation for accuracy and completeness, and I agree with the above.  Delon Lenis, DO     "

## 2024-03-29 LAB — SURGICAL PATHOLOGY

## 2024-03-30 ENCOUNTER — Ambulatory Visit: Payer: Self-pay | Admitting: Dermatology

## 2024-06-21 ENCOUNTER — Ambulatory Visit: Payer: Self-pay | Admitting: Family Medicine
